# Patient Record
Sex: Female | Born: 1950
Health system: Southern US, Community
[De-identification: ages and names within clinical notes are randomized; demographics above are authoritative.]

## PROBLEM LIST (undated history)

## (undated) DIAGNOSIS — M1990 Unspecified osteoarthritis, unspecified site: Secondary | ICD-10-CM

## (undated) DIAGNOSIS — E7800 Pure hypercholesterolemia, unspecified: Secondary | ICD-10-CM

## (undated) DIAGNOSIS — K589 Irritable bowel syndrome without diarrhea: Secondary | ICD-10-CM

## (undated) DIAGNOSIS — B009 Herpesviral infection, unspecified: Secondary | ICD-10-CM

## (undated) DIAGNOSIS — T7840XA Allergy, unspecified, initial encounter: Secondary | ICD-10-CM

## (undated) DIAGNOSIS — S82899A Other fracture of unspecified lower leg, initial encounter for closed fracture: Secondary | ICD-10-CM

## (undated) DIAGNOSIS — I10 Essential (primary) hypertension: Secondary | ICD-10-CM

## (undated) DIAGNOSIS — G4733 Obstructive sleep apnea (adult) (pediatric): Secondary | ICD-10-CM

## (undated) DIAGNOSIS — H9190 Unspecified hearing loss, unspecified ear: Secondary | ICD-10-CM

## (undated) DIAGNOSIS — E559 Vitamin D deficiency, unspecified: Secondary | ICD-10-CM

## (undated) DIAGNOSIS — K253 Acute gastric ulcer without hemorrhage or perforation: Secondary | ICD-10-CM

## (undated) DIAGNOSIS — M545 Low back pain: Secondary | ICD-10-CM

## (undated) DIAGNOSIS — K648 Other hemorrhoids: Secondary | ICD-10-CM

## (undated) DIAGNOSIS — K219 Gastro-esophageal reflux disease without esophagitis: Secondary | ICD-10-CM

## (undated) DIAGNOSIS — K760 Fatty (change of) liver, not elsewhere classified: Secondary | ICD-10-CM

## (undated) DIAGNOSIS — M2550 Pain in unspecified joint: Secondary | ICD-10-CM

## (undated) DIAGNOSIS — E785 Hyperlipidemia, unspecified: Secondary | ICD-10-CM

## (undated) DIAGNOSIS — R1013 Epigastric pain: Secondary | ICD-10-CM

## (undated) DIAGNOSIS — F329 Major depressive disorder, single episode, unspecified: Secondary | ICD-10-CM

## (undated) DIAGNOSIS — L578 Other skin changes due to chronic exposure to nonionizing radiation: Secondary | ICD-10-CM

## (undated) DIAGNOSIS — E039 Hypothyroidism, unspecified: Secondary | ICD-10-CM

## (undated) DIAGNOSIS — F0390 Unspecified dementia without behavioral disturbance: Secondary | ICD-10-CM

## (undated) DIAGNOSIS — F32A Depression, unspecified: Secondary | ICD-10-CM

## (undated) DIAGNOSIS — K5790 Diverticulosis of intestine, part unspecified, without perforation or abscess without bleeding: Secondary | ICD-10-CM

## (undated) DIAGNOSIS — R87629 Unspecified abnormal cytological findings in specimens from vagina: Secondary | ICD-10-CM

## (undated) DIAGNOSIS — Z972 Presence of dental prosthetic device (complete) (partial): Secondary | ICD-10-CM

## (undated) DIAGNOSIS — M722 Plantar fascial fibromatosis: Secondary | ICD-10-CM

## (undated) DIAGNOSIS — F419 Anxiety disorder, unspecified: Secondary | ICD-10-CM

## (undated) DIAGNOSIS — G4700 Insomnia, unspecified: Secondary | ICD-10-CM

## (undated) DIAGNOSIS — R4189 Other symptoms and signs involving cognitive functions and awareness: Secondary | ICD-10-CM

## (undated) HISTORY — DX: Diverticulosis of intestine, part unspecified, without perforation or abscess without bleeding: K57.90

## (undated) HISTORY — PX: HEMORRHOID BANDING: SHX5850

## (undated) HISTORY — DX: Irritable bowel syndrome, unspecified: K58.9

## (undated) HISTORY — PX: COLONOSCOPY: SHX5424

## (undated) HISTORY — DX: Pain in unspecified joint: M25.50

## (undated) HISTORY — DX: Allergy, unspecified, initial encounter: T78.40XA

## (undated) HISTORY — DX: Plantar fascial fibromatosis: M72.2

## (undated) HISTORY — DX: Low back pain: M54.5

## (undated) HISTORY — DX: Epigastric pain: R10.13

## (undated) HISTORY — DX: Depression, unspecified: F32.A

## (undated) HISTORY — PX: APPENDECTOMY: SHX54

## (undated) HISTORY — PX: NASAL SEPTUM SURGERY: SHX37

## (undated) HISTORY — DX: Anxiety disorder, unspecified: F41.9

## (undated) HISTORY — PX: CATARACT EXTRACTION, BILATERAL: SHX1313

## (undated) HISTORY — DX: Essential (primary) hypertension: I10

## (undated) HISTORY — PX: HAND SURGERY: SHX662

## (undated) HISTORY — DX: Other symptoms and signs involving cognitive functions and awareness: R41.89

## (undated) HISTORY — DX: Acute gastric ulcer without hemorrhage or perforation: K25.3

## (undated) HISTORY — PX: EYE SURGERY: SHX253

## (undated) HISTORY — DX: Other hemorrhoids: K64.8

## (undated) HISTORY — DX: Gastro-esophageal reflux disease without esophagitis: K21.9

## (undated) HISTORY — DX: Herpesviral infection, unspecified: B00.9

## (undated) HISTORY — PX: RHINOPLASTY: SHX2354

## (undated) HISTORY — DX: Obstructive sleep apnea (adult) (pediatric): G47.33

## (undated) HISTORY — DX: Other skin changes due to chronic exposure to nonionizing radiation: L57.8

## (undated) HISTORY — DX: Vitamin D deficiency, unspecified: E55.9

## (undated) HISTORY — DX: Unspecified abnormal cytological findings in specimens from vagina: R87.629

## (undated) HISTORY — PX: GASTRECTOMY: SHX58

## (undated) HISTORY — DX: Major depressive disorder, single episode, unspecified: F32.9

## (undated) HISTORY — DX: Hypothyroidism, unspecified: E03.9

## (undated) HISTORY — DX: Hyperlipidemia, unspecified: E78.5

## (undated) HISTORY — DX: Fatty (change of) liver, not elsewhere classified: K76.0

---

## 1993-07-14 HISTORY — PX: TOE SURGERY: SHX1073

## 1996-07-14 HISTORY — PX: KNEE ARTHROSCOPY: SHX127

## 1999-12-16 ENCOUNTER — Emergency Department (HOSPITAL_COMMUNITY): Admission: EM | Admit: 1999-12-16 | Discharge: 1999-12-16 | Payer: Self-pay | Admitting: Emergency Medicine

## 2007-01-05 ENCOUNTER — Encounter: Payer: Self-pay | Admitting: Adult Health

## 2009-12-12 LAB — HM MAMMOGRAPHY: HM Mammogram: NORMAL

## 2009-12-20 ENCOUNTER — Encounter: Payer: Self-pay | Admitting: Family

## 2009-12-26 ENCOUNTER — Encounter: Payer: Self-pay | Admitting: Family

## 2010-02-07 ENCOUNTER — Encounter: Payer: Self-pay | Admitting: Adult Health

## 2010-02-08 ENCOUNTER — Encounter: Payer: Self-pay | Admitting: Adult Health

## 2010-02-21 ENCOUNTER — Encounter: Payer: Self-pay | Admitting: Pulmonary Disease

## 2010-03-13 ENCOUNTER — Ambulatory Visit: Payer: Self-pay | Admitting: Family

## 2010-03-13 DIAGNOSIS — F341 Dysthymic disorder: Secondary | ICD-10-CM

## 2010-04-12 ENCOUNTER — Ambulatory Visit: Payer: Self-pay | Admitting: Internal Medicine

## 2010-04-12 ENCOUNTER — Encounter: Payer: Self-pay | Admitting: Internal Medicine

## 2010-04-12 DIAGNOSIS — I10 Essential (primary) hypertension: Secondary | ICD-10-CM | POA: Insufficient documentation

## 2010-04-12 DIAGNOSIS — E1149 Type 2 diabetes mellitus with other diabetic neurological complication: Secondary | ICD-10-CM | POA: Insufficient documentation

## 2010-04-12 DIAGNOSIS — J45909 Unspecified asthma, uncomplicated: Secondary | ICD-10-CM | POA: Insufficient documentation

## 2010-04-12 DIAGNOSIS — J4520 Mild intermittent asthma, uncomplicated: Secondary | ICD-10-CM | POA: Insufficient documentation

## 2010-04-12 DIAGNOSIS — E785 Hyperlipidemia, unspecified: Secondary | ICD-10-CM | POA: Insufficient documentation

## 2010-04-12 LAB — CONVERTED CEMR LAB
ALT: 24 units/L (ref 0–35)
AST: 18 units/L (ref 0–37)
Albumin: 4.3 g/dL (ref 3.5–5.2)
Alkaline Phosphatase: 55 units/L (ref 39–117)
Chloride: 108 meq/L (ref 96–112)
Creatinine, Ser: 0.76 mg/dL (ref 0.40–1.20)
Creatinine, Urine: 192.9 mg/dL
Glucose, Bld: 102 mg/dL — ABNORMAL HIGH (ref 70–99)
Indirect Bilirubin: 0.4 mg/dL (ref 0.0–0.9)
MCV: 92.8 fL (ref 78.0–100.0)
RBC: 4.43 M/uL (ref 3.87–5.11)
Sodium: 141 meq/L (ref 135–145)
TSH: 3.137 microintl units/mL (ref 0.350–4.500)
Total Bilirubin: 0.5 mg/dL (ref 0.3–1.2)
Total CHOL/HDL Ratio: 3.7
Total Protein: 6.9 g/dL (ref 6.0–8.3)
VLDL: 26 mg/dL (ref 0–40)
WBC: 6 10*3/uL (ref 4.0–10.5)

## 2010-04-15 ENCOUNTER — Encounter: Payer: Self-pay | Admitting: Internal Medicine

## 2010-04-16 ENCOUNTER — Encounter: Payer: Self-pay | Admitting: Internal Medicine

## 2010-04-18 ENCOUNTER — Encounter: Payer: Self-pay | Admitting: Pulmonary Disease

## 2010-04-25 ENCOUNTER — Encounter: Payer: Self-pay | Admitting: Pulmonary Disease

## 2010-05-14 ENCOUNTER — Ambulatory Visit: Payer: Self-pay | Admitting: Internal Medicine

## 2010-05-14 DIAGNOSIS — M722 Plantar fascial fibromatosis: Secondary | ICD-10-CM

## 2010-05-14 LAB — HM DIABETES EYE EXAM

## 2010-05-16 ENCOUNTER — Encounter (INDEPENDENT_AMBULATORY_CARE_PROVIDER_SITE_OTHER): Payer: Self-pay | Admitting: *Deleted

## 2010-05-20 ENCOUNTER — Ambulatory Visit: Payer: Self-pay | Admitting: Internal Medicine

## 2010-05-27 ENCOUNTER — Ambulatory Visit: Payer: Self-pay | Admitting: Family

## 2010-06-03 ENCOUNTER — Ambulatory Visit: Payer: Self-pay | Admitting: Internal Medicine

## 2010-06-03 LAB — HM COLONOSCOPY

## 2010-06-13 ENCOUNTER — Telehealth: Payer: Self-pay | Admitting: Internal Medicine

## 2010-06-13 ENCOUNTER — Emergency Department (HOSPITAL_BASED_OUTPATIENT_CLINIC_OR_DEPARTMENT_OTHER): Admission: EM | Admit: 2010-06-13 | Discharge: 2010-06-13 | Payer: Self-pay | Admitting: Emergency Medicine

## 2010-06-14 ENCOUNTER — Ambulatory Visit: Payer: Self-pay | Admitting: Family

## 2010-06-14 ENCOUNTER — Encounter: Payer: Self-pay | Admitting: Family

## 2010-06-14 DIAGNOSIS — L02619 Cutaneous abscess of unspecified foot: Secondary | ICD-10-CM | POA: Insufficient documentation

## 2010-06-14 DIAGNOSIS — L03039 Cellulitis of unspecified toe: Secondary | ICD-10-CM

## 2010-06-18 ENCOUNTER — Ambulatory Visit (HOSPITAL_BASED_OUTPATIENT_CLINIC_OR_DEPARTMENT_OTHER)
Admission: RE | Admit: 2010-06-18 | Discharge: 2010-06-18 | Payer: Self-pay | Source: Home / Self Care | Admitting: Pulmonary Disease

## 2010-06-18 ENCOUNTER — Encounter: Payer: Self-pay | Admitting: Family

## 2010-06-18 ENCOUNTER — Ambulatory Visit: Payer: Self-pay

## 2010-06-18 DIAGNOSIS — G4733 Obstructive sleep apnea (adult) (pediatric): Secondary | ICD-10-CM

## 2010-06-20 LAB — CONVERTED CEMR LAB
BUN: 13 mg/dL (ref 6–23)
CO2: 26 meq/L (ref 19–32)
Chloride: 106 meq/L (ref 96–112)
Creatinine, Ser: 0.73 mg/dL (ref 0.40–1.20)
Eosinophils Relative: 2 % (ref 0–5)
Glucose, Bld: 84 mg/dL (ref 70–99)
Lymphocytes Relative: 37 % (ref 12–46)
Lymphs Abs: 2.6 10*3/uL (ref 0.7–4.0)
MCHC: 33 g/dL (ref 30.0–36.0)
Neutro Abs: 3.9 10*3/uL (ref 1.7–7.7)
Neutrophils Relative %: 55 % (ref 43–77)
Platelets: 249 10*3/uL (ref 150–400)
Pro B Natriuretic peptide (BNP): 7.7 pg/mL (ref 0.0–100.0)
RBC: 4.35 M/uL (ref 3.87–5.11)
RDW: 12.8 % (ref 11.5–15.5)
Sodium: 142 meq/L (ref 135–145)

## 2010-06-21 ENCOUNTER — Ambulatory Visit: Payer: Self-pay | Admitting: Family

## 2010-06-25 ENCOUNTER — Telehealth (INDEPENDENT_AMBULATORY_CARE_PROVIDER_SITE_OTHER): Payer: Self-pay | Admitting: *Deleted

## 2010-06-28 ENCOUNTER — Encounter: Payer: Self-pay | Admitting: Family

## 2010-07-01 ENCOUNTER — Telehealth (INDEPENDENT_AMBULATORY_CARE_PROVIDER_SITE_OTHER): Payer: Self-pay | Admitting: *Deleted

## 2010-07-02 ENCOUNTER — Ambulatory Visit: Payer: Self-pay

## 2010-07-02 ENCOUNTER — Encounter: Payer: Self-pay | Admitting: *Deleted

## 2010-07-02 ENCOUNTER — Encounter: Payer: Self-pay | Admitting: Cardiology

## 2010-07-02 ENCOUNTER — Encounter (HOSPITAL_COMMUNITY)
Admission: RE | Admit: 2010-07-02 | Discharge: 2010-08-13 | Payer: Self-pay | Source: Home / Self Care | Attending: Internal Medicine | Admitting: Internal Medicine

## 2010-07-02 ENCOUNTER — Ambulatory Visit: Payer: Self-pay | Admitting: Pulmonary Disease

## 2010-07-05 ENCOUNTER — Telehealth: Payer: Self-pay | Admitting: Family

## 2010-07-05 ENCOUNTER — Ambulatory Visit (HOSPITAL_BASED_OUTPATIENT_CLINIC_OR_DEPARTMENT_OTHER)
Admission: RE | Admit: 2010-07-05 | Discharge: 2010-07-05 | Payer: Self-pay | Source: Home / Self Care | Attending: Internal Medicine | Admitting: Internal Medicine

## 2010-07-05 ENCOUNTER — Ambulatory Visit: Payer: Self-pay | Admitting: Family

## 2010-07-10 ENCOUNTER — Telehealth: Payer: Self-pay | Admitting: Family

## 2010-07-12 ENCOUNTER — Telehealth: Payer: Self-pay | Admitting: Family

## 2010-07-12 ENCOUNTER — Ambulatory Visit (HOSPITAL_BASED_OUTPATIENT_CLINIC_OR_DEPARTMENT_OTHER)
Admission: RE | Admit: 2010-07-12 | Discharge: 2010-07-12 | Payer: Self-pay | Source: Home / Self Care | Attending: Internal Medicine | Admitting: Internal Medicine

## 2010-07-12 ENCOUNTER — Ambulatory Visit: Payer: Self-pay | Admitting: Family

## 2010-07-19 ENCOUNTER — Encounter: Payer: Self-pay | Admitting: Family

## 2010-07-19 ENCOUNTER — Ambulatory Visit
Admission: RE | Admit: 2010-07-19 | Discharge: 2010-07-19 | Payer: Self-pay | Source: Home / Self Care | Attending: Family | Admitting: Family

## 2010-07-19 DIAGNOSIS — R9431 Abnormal electrocardiogram [ECG] [EKG]: Secondary | ICD-10-CM | POA: Insufficient documentation

## 2010-07-19 DIAGNOSIS — K3189 Other diseases of stomach and duodenum: Secondary | ICD-10-CM | POA: Insufficient documentation

## 2010-07-19 DIAGNOSIS — R1013 Epigastric pain: Secondary | ICD-10-CM

## 2010-07-19 LAB — CONVERTED CEMR LAB
ALT: 36 units/L — ABNORMAL HIGH (ref 0–35)
AST: 21 units/L (ref 0–37)
Alkaline Phosphatase: 66 units/L (ref 39–117)
Amylase: 17 units/L (ref 0–105)
Basophils Absolute: 0 10*3/uL (ref 0.0–0.1)
Bilirubin, Direct: 0.1 mg/dL (ref 0.0–0.3)
Hemoglobin: 13.6 g/dL (ref 12.0–15.0)
Lipase: 56 units/L (ref 0–75)
Lymphocytes Relative: 33 % (ref 12–46)
MCHC: 31.8 g/dL (ref 30.0–36.0)
Monocytes Absolute: 0.7 10*3/uL (ref 0.1–1.0)
Neutro Abs: 4.2 10*3/uL (ref 1.7–7.7)
Platelets: 298 10*3/uL (ref 150–400)
RBC: 4.44 M/uL (ref 3.87–5.11)
RDW: 13.1 % (ref 11.5–15.5)
Total Bilirubin: 0.3 mg/dL (ref 0.3–1.2)

## 2010-07-22 ENCOUNTER — Telehealth: Payer: Self-pay | Admitting: Family

## 2010-07-24 ENCOUNTER — Ambulatory Visit
Admission: RE | Admit: 2010-07-24 | Discharge: 2010-07-24 | Payer: Self-pay | Source: Home / Self Care | Attending: Cardiology | Admitting: Cardiology

## 2010-07-24 ENCOUNTER — Encounter: Payer: Self-pay | Admitting: Cardiology

## 2010-07-24 DIAGNOSIS — R002 Palpitations: Secondary | ICD-10-CM | POA: Insufficient documentation

## 2010-07-31 ENCOUNTER — Ambulatory Visit: Admit: 2010-07-31 | Payer: Self-pay | Admitting: Family

## 2010-08-01 ENCOUNTER — Telehealth (INDEPENDENT_AMBULATORY_CARE_PROVIDER_SITE_OTHER): Payer: Self-pay | Admitting: *Deleted

## 2010-08-02 ENCOUNTER — Other Ambulatory Visit
Admission: RE | Admit: 2010-08-02 | Discharge: 2010-08-02 | Payer: Self-pay | Source: Home / Self Care | Admitting: Internal Medicine

## 2010-08-02 ENCOUNTER — Other Ambulatory Visit: Payer: Self-pay | Admitting: Family

## 2010-08-02 ENCOUNTER — Ambulatory Visit
Admission: RE | Admit: 2010-08-02 | Discharge: 2010-08-02 | Payer: Self-pay | Source: Home / Self Care | Attending: Family | Admitting: Family

## 2010-08-02 DIAGNOSIS — R928 Other abnormal and inconclusive findings on diagnostic imaging of breast: Secondary | ICD-10-CM | POA: Insufficient documentation

## 2010-08-02 DIAGNOSIS — N949 Unspecified condition associated with female genital organs and menstrual cycle: Secondary | ICD-10-CM | POA: Insufficient documentation

## 2010-08-02 LAB — CONVERTED CEMR LAB: Hgb A1c MFr Bld: 6.7 % — ABNORMAL HIGH (ref <5.7)

## 2010-08-05 ENCOUNTER — Encounter: Payer: Self-pay | Admitting: Family

## 2010-08-05 ENCOUNTER — Telehealth: Payer: Self-pay | Admitting: Family

## 2010-08-05 ENCOUNTER — Encounter (INDEPENDENT_AMBULATORY_CARE_PROVIDER_SITE_OTHER): Payer: Self-pay | Admitting: *Deleted

## 2010-08-06 ENCOUNTER — Encounter: Payer: Self-pay | Admitting: Family

## 2010-08-06 ENCOUNTER — Telehealth: Payer: Self-pay | Admitting: Family

## 2010-08-06 DIAGNOSIS — A6000 Herpesviral infection of urogenital system, unspecified: Secondary | ICD-10-CM | POA: Insufficient documentation

## 2010-08-09 ENCOUNTER — Encounter: Payer: Self-pay | Admitting: Family

## 2010-08-09 LAB — HM MAMMOGRAPHY

## 2010-08-13 NOTE — Letter (Signed)
Summary: [facility]   Imported By: Lanelle Bal 05/25/2010 09:48:41  _____________________________________________________________________  External Attachment:    Type:   Image     Comment:   External Document

## 2010-08-13 NOTE — Progress Notes (Signed)
Summary: Patient to ED  Phone Note Call from Patient   Caller: Patient Details for Reason: Facial numbness Summary of Call:  Pt called having facial numbness  fingers numb  " just feel out of whack"   .  instructed by Dr Artist Pais to go to ED Initial call taken by: Darral Dash,  June 13, 2010 12:29 PM

## 2010-08-13 NOTE — Letter (Signed)
   [place] [facility] [address] [place], [place]  [address]  [place] Phone: [phone]      April 16, 2010   [facility] [address] [place], [place] [address]  RE:  LAB RESULTS  Dear  Ms. Schlechter,  The following is an interpretation of your most recent lab tests.  Please take note of any instructions provided or changes to medications that have resulted from your lab work.  ELECTROLYTES:  Good - no changes needed  KIDNEY FUNCTION TESTS:  Good - no changes needed  LIVER FUNCTION TESTS:  Good - no changes needed  LIPID PANEL:  Good - no changes needed Triglyceride: 131   Cholesterol: 169   LDL: 97   HDL: 46   Chol/HDL%:  3.7 Ratio  THYROID STUDIES:  Thyroid studies normal TSH: 3.137     DIABETIC STUDIES:  Good - no changes needed Blood Glucose: 102   HgbA1C: 6.3   Microalbumin/Creatinine Ratio: 2.6     CBC:  Good - no changes needed    Please call our office if you continue to have low blood sugar readings.     Sincerely Yours,    Dr. Thomos Lemons  Appended Document:  mailed

## 2010-08-13 NOTE — Letter (Signed)
Summary: Diabetic Instructions  [facility]  [address]   [place], [place] [address]   Phone: [phone]  Fax: [phone]    Crystal [name] 12-12-1950 MRN: [id number]   (Metformin and Glimepiride)    ORAL DIABETIC MEDICATION INSTRUCTIONS  The day before your procedure:   Take your diabetic pill as you do normally  The day of your procedure:   Do not take your diabetic pill    We will check your blood sugar levels during the admission process and again in Recovery before discharging you home  ________________________________________________________________________

## 2010-08-13 NOTE — Procedures (Signed)
Summary: Colonoscopy  Patient: [name] Note: All result statuses are Final unless otherwise noted.  Tests: (1) Colonoscopy (COL)   COL Colonoscopy           DONE     [facility]     [address]     [place], [place]  [address]           COLONOSCOPY PROCEDURE REPORT           PATIENT:  Crystal [name], Crystal [name]  MR#:  [id number]     BIRTHDATE:  1950/12/27, 59 yrs. old  GENDER:  female     ENDOSCOPIST:  Iva Boop, MD, [facility]     REF. BY:  Thomos Lemons, DO     PROCEDURE DATE:  06/03/2010     PROCEDURE:  Colonoscopy [id number]     ASA CLASS:  Class III     INDICATIONS:  Elevated Risk Screening, family history of colon     cancer sister diagnosed in her 55's     MEDICATIONS:   Fentanyl 75 mcg IV, Versed 8 mg IV           DESCRIPTION OF PROCEDURE:   After the risks benefits and     alternatives of the procedure were thoroughly explained, informed     consent was obtained.  Digital rectal exam was performed and     revealed no abnormalities.   The LB 180AL [id number] endoscope was     introduced through the anus and advanced to the cecum, which was     identified by both the appendix and ileocecal valve, without     limitations.  The quality of the prep was excellent, using     MoviPrep.  The instrument was then slowly withdrawn as the colon     was fully examined. Insertion: 2:45 minutes Withdrawal: 9:49     minutes     <<PROCEDUREIMAGES>>           FINDINGS:  Moderate diverticulosis was found in the sigmoid colon.     This was otherwise a normal examination of the colon including     right colon retroflexion.   Retroflexed views in the rectum     revealed internal and external hemorrhoids.    The scope was then     withdrawn from the patient and the procedure completed.           COMPLICATIONS:  None     ENDOSCOPIC IMPRESSION:     1) Moderate diverticulosis in the sigmoid colon     2) Internal and external hemorrhoids     3) Otherwise normal examination with excellent prep     4) Family history of colon cancer (sister in her 28's)           REPEAT EXAM:  In 5 year(s) for routine screening colonoscopy.           Iva Boop, MD, [facility]           CC:  Thomos Lemons, DO and The Patient           n.     eSIGNED:   Iva Boop at 06/03/2010 09:04 AM           Jeronimo Norma, [id number]  Note: An exclamation mark (!) indicates a result that was not dispersed into the flowsheet. Document Creation Date: 06/03/2010 9:05 AM _______________________________________________________________________  (1) Order result status: Final Collection or observation date-time: 06/03/2010 08:55 Requested date-time:  Receipt date-time:  Reported  date-time:  Referring Physician:   Ordering Physician: Stan Head [phone]) Specimen Source:  Source: Launa Grill Order Number: [id number] Lab site:   Appended Document: Colonoscopy    Clinical Lists Changes  Observations: Added new observation of COLONNXTDUE: 05/2015 (06/03/2010 11:50)

## 2010-08-13 NOTE — Letter (Signed)
Summary: Pre Visit Letter Revised  [facility]  [address] [place], [place] [address]   Phone: [phone]  Fax: [phone]        04/12/2010 MRN: [id number] Crystal [name] [address] [place], [place]  [address]             Procedure Date:  11-21 at 8:30am  Welcome to the Gastroenterology Division at [facility].    You are scheduled to see a nurse for your pre-procedure visit on 05-20-10 at 10am  on the 3rd floor at [facility], [address]  We ask that you try to arrive at our office 15 minutes prior to your appointment time to allow for check-in.  Please take a minute to review the attached form.  If you answer "Yes" to one or more of the questions on the first page, we ask that you call the person listed at your earliest opportunity.  If you answer "No" to all of the questions, please complete the rest of the form and bring it to your appointment.    Your nurse visit will consist of discussing your medical and surgical history, your immediate family medical history, and your medications.   If you are unable to list all of your medications on the form, please bring the medication bottles to your appointment and we will list them.  We will need to be aware of both prescribed and over the counter drugs.  We will need to know exact dosage information as well.    Please be prepared to read and sign documents such as consent forms, a financial agreement, and acknowledgement forms.  If necessary, and with your consent, a friend or relative is welcome to sit-in on the nurse visit with you.  Please bring your insurance card so that we may make a copy of it.  If your insurance requires a referral to see a specialist, please bring your referral form from your primary care physician.  No co-pay is required for this nurse visit.     If you cannot keep your appointment, please call [phone] to cancel or reschedule prior to your appointment date.   This allows [place] the opportunity to schedule an appointment for another patient in need of care.    Thank you for choosing [facility] for your medical needs.  We appreciate the opportunity to care for you.  Please visit [place] at our website  to learn more about our practice.  Sincerely, The Gastroenterology Division

## 2010-08-13 NOTE — Assessment & Plan Note (Signed)
Summary: NEW TO EST/[employer] lm home machine   Vital Signs:  Patient profile:   60 year old female LMP:     07/15/1995 Height:      63.25 inches Weight:      157.50 pounds BMI:     27.78 Temp:     97.7 degrees F oral Pulse rate:   72 / minute Pulse rhythm:   regular Resp:     12 per minute BP sitting:   126 / 80  (right arm) Cuff size:   regular  Vitals Entered By: Mervin Kung CMA Duncan Dull) (March 13, 2010 10:09 AM) Is Patient Diabetic? Yes Pain Assessment Patient in pain? no      Comments Wants to get eye exam, needs better diabetes control, stressed out. Also has sinus congestion. Nicki Guadalajara Fergerson CMA [facility] Dull)  March 13, 2010 10:12 AM  LMP (date): 07/15/1995     Enter LMP: 07/15/1995   History of Present Illness: Ms Salvi is a 60 year old female who presents today to establish care.   Previously, she has been followed by Dr. Dutch Gray, [facility].  Notes that she was recenlty diagnosed with asthma.  Notes that she is struggling with her husband's recent cancer diagnosis- melanoma of the lungs.   Notes that she often has been irritable, worse since entering menopause.  Occasional tearfulness.  Notes that she does not active suicidal thougths.  Does not sleep well- using Dulera for her asthma.  Using Pro-air about every other day. Not exercising, lost interest in things she likes to do.  Has gained 6 pounds.   Patient was at [facility] due to syncope and was reportedly diagnosed with asthma.    Never been a smoker.  Tells me that she also recently lost her dog who she was very close with and whom she used to walk with every day.   Preventive Screening-Counseling & Management  Alcohol-Tobacco     Alcohol drinks/day: 0     Smoking Status: never  Caffeine-Diet-Exercise     Caffeine use/day: none     Does Patient Exercise: no  Allergies (verified): No Known Drug Allergies  Past History:  Past Medical History: Asthma Diabetes 2- diagnosed in  2003 Fainting spells secondary to asthma Hx of stomach ulcer as a teenager HTN Hypercholesterolemia  Past Surgical History: Left knee laproscopy-- 1998 Right Toe surgery-- 1995 Ulcer sugery at age 62 (?partial gastrectomy) deviated septum, rhinoplasty   Family History: Mother-- Diabetes Type I, heart prob.; deceased in her early 42's Father-- Bleeding ulcer, deceased  late seventies 4 kids-- 3 healthy, 1 died at 6 wks (heart defects, perforated bowel)  Social History: Occupation:  data entry- works at [employer] Married Never Smoked Alcohol use-no Regular exercise-no Smoking Status:  never Caffeine use/day:  none Does Patient Exercise:  no  Physical Exam  General:  Well-developed,well-nourished,in no acute distress; alert,appropriate and cooperative throughout examination Lungs:  Normal respiratory effort, chest expands symmetrically. Lungs are clear to auscultation, no crackles or wheezes. Heart:  Normal rate and regular rhythm. S1 and S2 normal without gallop, murmur, click, rub or other extra sounds. Extremities:  No clubbing, cyanosis, edema, or deformity noted with normal full range of motion of all joints.   Neurologic:  No cranial nerve deficits noted. Station and gait are normal. Plantar reflexes are down-going bilaterally. DTRs are symmetrical throughout. Sensory, motor and coordinative functions appear intact. Psych:  pleasant female, Oriented X3.  Became tearful at times during interview.   Impression &  Recommendations:  Problem # 1:  DEPRESSION/ANXIETY (ICD-300.4) Assessment Deteriorated  60 yr old patient with history of depression, no worsened by recent death of her dog and husband's recent diagnosis of metatstatic melanoma with mets to his lungs.  Will plan trial of Sertraline with follow up in 1 month.  Patient and husband were educated on side effects and red flags that should prompt return as noted in pt instructions.  30 minutes were spent with the patient.   >50% of this time was spent counseling the patient on her depression and anxiety.    Complete Medication List: 1)  Amaryl 2 Mg Tabs (Glimepiride) .... Take 1 tablet by mouth once a day 2)  Lipitor 10 Mg Tabs (Atorvastatin calcium) .... Take 1 tablet by mouth once a day 3)  Dulera 200-5 Mcg/act Aero (Mometasone furo-formoterol fum) .... Take 2 puffs every 12 hours 4)  Proair Hfa 108 (90 Base) Mcg/act Aers (Albuterol sulfate) .... Take 2 puffs every 4 hours as needed. 5)  Lisinopril-hydrochlorothiazide 10-12.5 Mg Tabs (Lisinopril-hydrochlorothiazide) .... Take 1 tablet by mouth once a day 6)  Hydrochlorothiazide 25 Mg Tabs (Hydrochlorothiazide) .... Take 1 tablet by mouth once a day 7)  Sertraline Hcl 50 Mg Tabs (Sertraline hcl) .... One tablet by mouth daily  Patient Instructions: 1)  Sertraline- Please start 1/2 tablet one a day for one week, then increase to a full tablet. 2)  It will likely take several weeks before you will notice improvement. 3)  Side effects of this medicine may include drowsiness or nausea.  If this becomes an issue for you call for further instructions. 4)  Very rarely people may develop suicidal thoughts when taking these types of medicines- should this happen to you, discontinue medication and go directly to the emergency room. 5)  Please arrange a follow up appointment in 1 month  Prescriptions: SERTRALINE HCL 50 MG TABS (SERTRALINE HCL) one tablet by mouth daily  #30 x 1   Entered and Authorized by:   Lemont Fillers FNP   Signed by:   Lemont Fillers FNP on 03/13/2010   Method used:   Electronically to        [facility]* (retail)       [address] [place], [place]  [address]       Ph: [phone]       Fax: [phone]   RxID:   [id number]    Preventive Care Screening  Mammogram:    Date:  12/12/2009    Results:  normal      Last pap smear was years go.  Don't know last tetanus. Nicki Guadalajara Fergerson CMA  [facility] Dull)  March 13, 2010 10:22 AM     Immunization History:  Pneumovax Immunization History:    Pneumovax:  historical (07/14/2004)     Vital Signs:  Patient Profile:   60 year old female LMP:     07/15/1995 Height:     63.25 inches Weight:      157.50 pounds BMI:     27.78 Temp:     97.7 degrees F oral Pulse rate:   72 / minute Pulse rhythm:   regular Resp:     12 per minute BP sitting:   126 / 80 Cuff size:   regular                 Current Allergies (reviewed today): No known allergies

## 2010-08-13 NOTE — Miscellaneous (Signed)
Summary: [facility]  Clinical Lists Changes  Medications: Added new medication of MOVIPREP 100 GM  SOLR (PEG-KCL-NACL-NASULF-NA ASC-C) As per prep instructions. - Signed Rx of MOVIPREP 100 GM  SOLR (PEG-KCL-NACL-NASULF-NA ASC-C) As per prep instructions.;  #1 x 0;  Signed;  Entered by: Ezra Sites RN;  Authorized by: Iva Boop MD, [facility];  Method used: Electronically to [facility] [address] [place], [place], [place]  [address], Ph: [phone], Fax: [phone] Observations: Added new observation of ALLERGY REV: Done (05/20/2010 9:30)    Prescriptions: MOVIPREP 100 GM  SOLR (PEG-KCL-NACL-NASULF-NA ASC-C) As per prep instructions.  #1 x 0   Entered by:   Ezra Sites RN   Authorized by:   Iva Boop MD, [facility]   Signed by:   Ezra Sites RN on 05/20/2010   Method used:   Electronically to        [facility]* (retail)       [address] [place], [place]  [address]       Ph: [phone]       Fax: [phone]   RxID:   [id number]

## 2010-08-13 NOTE — Letter (Signed)
Summary: Moviprep Instructions  [facility]  [address]   [place], [place] [address]   Phone: [phone]  Fax: [phone]       Adreena Mcleish    1950-10-14    MRN: [id number]        Procedure Day Dorna Bloom: Monday, 06-03-10     Arrival Time: 7:30 a.m.     Procedure Time: 8:30 a.m.     Location of Procedure:                     x  [facility] (4th Floor)   PREPARATION FOR COLONOSCOPY WITH MOVIPREP   Starting 5 days prior to your procedure 05-29-10 do not eat nuts, seeds, popcorn, corn, beans, peas,  salads, or any raw vegetables.  Do not take any fiber supplements (e.g. Metamucil, Citrucel, and Benefiber).  THE DAY BEFORE YOUR PROCEDURE         DATE: 06-02-10   DAY: Sunday  1.  Drink clear liquids the entire day-NO SOLID FOOD  2.  Do not drink anything colored red or purple.  Avoid juices with pulp.  No orange juice.  3.  Drink at least 64 oz. (8 glasses) of fluid/clear liquids during the day to prevent dehydration and help the prep work efficiently.  CLEAR LIQUIDS INCLUDE: Water Jello Ice Popsicles Tea (sugar ok, no milk/cream) Powdered fruit flavored drinks Coffee (sugar ok, no milk/cream) Gatorade Juice: apple, white grape, white cranberry  Lemonade Clear bullion, consomm, broth Carbonated beverages (any kind) Strained chicken noodle soup Hard Candy                             4.  In the morning, mix first dose of MoviPrep solution:    Empty 1 Pouch A and 1 Pouch B into the disposable container    Add lukewarm drinking water to the top line of the container. Mix to dissolve    Refrigerate (mixed solution should be used within 24 hrs)  5.  Begin drinking the prep at 5:00 p.m. The MoviPrep container is divided by 4 marks.   Every 15 minutes drink the solution down to the next mark (approximately 8 oz) until the full liter is complete.   6.  Follow completed prep with 16 oz of clear liquid of your choice (Nothing red or  purple).  Continue to drink clear liquids until bedtime.  7.  Before going to bed, mix second dose of MoviPrep solution:    Empty 1 Pouch A and 1 Pouch B into the disposable container    Add lukewarm drinking water to the top line of the container. Mix to dissolve    Refrigerate  THE DAY OF YOUR PROCEDURE      DATE: 06-03-10   DAY: Monday  Beginning at 3:30 a.m. (5 hours before procedure):         1. Every 15 minutes, drink the solution down to the next mark (approx 8 oz) until the full liter is complete.  2. Follow completed prep with 16 oz. of clear liquid of your choice.    3. You may drink clear liquids until 6:30 a.m. (2 HOURS BEFORE PROCEDURE).   MEDICATION INSTRUCTIONS  Unless otherwise instructed, you should take regular prescription medications with a small sip of water   as early as possible the morning of your procedure.  Diabetic patients - see separate instructions.   Additional medication instructions: Do not take Losartan/HCTZ  day of procedure.         OTHER INSTRUCTIONS  You will need a responsible adult at least 62 years of age to accompany you and drive you home.   This person must remain in the waiting room during your procedure.  Wear loose fitting clothing that is easily removed.  Leave jewelry and other valuables at home.  However, you may wish to bring a book to read or  an iPod/MP3 player to listen to music as you wait for your procedure to start.  Remove all body piercing jewelry and leave at home.  Total time from sign-in until discharge is approximately 2-3 hours.  You should go home directly after your procedure and rest.  You can resume normal activities the  day after your procedure.  The day of your procedure you should not:   Drive   Make legal decisions   Operate machinery   Drink alcohol   Return to work  You will receive specific instructions about eating, activities and medications before you leave.    The above  instructions have been reviewed and explained to me by   Ezra Sites RN  May 20, 2010 10:00 AM    I fully understand and can verbalize these instructions _____________________________ Date _________

## 2010-08-13 NOTE — Assessment & Plan Note (Signed)
Summary: cpx/mhf rsch per pt/dt   Vital Signs:  Patient profile:   60 year old female Weight:      159.25 pounds BMI:     28.09 O2 Sat:      100 % on Room air Temp:     97.6 degrees F oral Pulse rate:   69 / minute Pulse rhythm:   regular Resp:     18 per minute BP sitting:   100 / 70  (right arm)  Vitals Entered By: Glendell Docker CMA (April 12, 2010 8:30 AM)  O2 Flow:  Room air CC: CPX Is Patient Diabetic? Yes Pain Assessment Patient in pain? no      Comments fasting for blood work, states blood sugar bottoms out at 54 on a daily basis in the morning, needs a note to have a snack at her desk for low blood sugars     Last PAP Result Declined   Primary Care Provider:  Dondra Spry DO  CC:  CPX.  History of Present Illness: 60 y/o white female for cpx  last had diabetes checked 5 yrs ago diagnosed in 2003 - pt having freq hypoglycemic episodes pt checking blood sugar regularly  pt takes glimepiride in AM - 6:15 AM  adult health maintenance protocal reviewed     Contraindications/Deferment of Procedures/Staging:    Test/Procedure: TD vaccine    Reason for deferment: declined   Preventive Screening-Counseling & Management  Alcohol-Tobacco     Alcohol drinks/day: 0     Smoking Status: never  Caffeine-Diet-Exercise     Caffeine use/day: None     Does Patient Exercise: no  Allergies (verified): 1)  ! Compazine 2)  ! * Tetanus  Past History:  Past Medical History: Asthma Diabetes 2- diagnosed in 2003 Fainting spells secondary to asthma Hx of stomach ulcer as a teenager HTN  Hypercholesterolemia Hyperlipidemia Hypertension  Past Surgical History: Left knee laproscopy-- 1998 Right Toe surgery-- 1995 Ulcer sugery at age 52 (?partial gastrectomy) deviated septum, rhinoplasty     Family History: Mother-- Diabetes Type II, heart prob.; deceased in her early 41's Father-- Bleeding ulcer, deceased  late seventies 4 kids-- 3 healthy, 1  died at 6 wks (heart defects, perforated bowel)  Social History: Reviewed history from 03/13/2010 and no changes required. 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   Eyes:  pupils equal, pupils round, and pupils reactive to light.   Ears:  R ear normal and L ear normal.   Mouth:  upper dental plate Neck:  No deformities, masses, or tenderness noted.no carotid bruits.   Lungs:  normal respiratory effort, normal breath sounds, no crackles, and no wheezes.   Heart:  normal rate, regular rhythm, no murmur, and no gallop.   Abdomen:  soft, non-tender, and normal bowel sounds.  left lower pelvic fullness Extremities:  trace left pedal edema and trace right pedal edema.   Neurologic:  cranial nerves II-XII intact and gait normal.     Impression & Recommendations:  Problem # 1:  PREVENTIVE HEALTH CARE (ICD-V70.0)  pt defers PAP and Pelvic until November Orders: Gastroenterology Referral (GI)  Mammogram: normal (12/12/2009) Pap smear: Declined (04/12/2010) Td Booster: Allergic (04/12/2010)   Flu Vax: Historical (03/27/2010)   Pneumovax: Historical (07/14/2004)  Problem # 2:  DIABETES MELLITUS, TYPE II (ICD-250.00) pt having hypoglycemia.  reduce glimepiride dose  Her updated medication list for this problem includes:    Glimepiride 1 Mg Tabs (Glimepiride) .Marland Kitchen... 1/2 tab by mouth q am    Losartan Potassium-hctz 50-12.5 Mg Tabs (Losartan potassium-hctz) ..... One by mouth once daily  Orders: T-Basic Metabolic Panel [id number]) T-Hepatic Function [id number]) T-Lipid Profile [id number]) T-CBC No Diff ([id number]) T-TSH ([id number]) T-Urine Microalbumin w/creat. ratio [phone]) Ophthalmology Referral  (Ophthalmology)  Complete Medication List: 1)  Glimepiride 1 Mg Tabs (Glimepiride) .... 1/2 tab by mouth q am 2)  Lipitor 10 Mg Tabs (Atorvastatin calcium) .... Take 1 tablet by mouth once a day 3)  Dulera 200-5 Mcg/act Aero (Mometasone furo-formoterol fum) .... Take 2 puffs every 12 hours 4)  Proair Hfa 108 (90 Base) Mcg/act Aers (Albuterol sulfate) .... Take 2 puffs every 4 hours as needed. 5)  Losartan Potassium-hctz 50-12.5 Mg Tabs (Losartan potassium-hctz) .... One by mouth once daily 6)  Sertraline Hcl 50 Mg Tabs (Sertraline hcl) .... One tablet by mouth daily  Patient Instructions: 1)  Stop lisinopril / hctz 2)  Take lower dose of glimepiride as directed 3)  Please schedule a follow-up appointment in 1 month. Prescriptions: SERTRALINE HCL 50 MG TABS (SERTRALINE HCL) one tablet by mouth daily  #30 x 1   Entered and Authorized by:   D. Thomos Lemons DO   Signed by:   D. Thomos Lemons DO on 04/12/2010   Method used:   Electronically to        [facility]* (retail)       [address] [place], [place]  [address]       Ph: [phone]       Fax: [phone]   RxID:   [id number] LOSARTAN POTASSIUM-HCTZ 50-12.5 MG TABS (LOSARTAN POTASSIUM-HCTZ) one by mouth once daily  #30 x 2   Entered and Authorized by:   D. Thomos Lemons DO   Signed by:   D. Thomos Lemons DO on 04/12/2010   Method used:   Electronically to        [facility]* (retail)       [address] [place], [place]  [address]       Ph: [phone]       Fax: [phone]   RxID:   [id number] LIPITOR 10 MG TABS (ATORVASTATIN CALCIUM) Take 1 tablet by mouth once a day  #90 x 0   Entered and Authorized by:   D. Thomos Lemons DO   Signed by:   D. Thomos Lemons DO on 04/12/2010   Method used:   Electronically to        [facility]* (retail)       [address] [place], [place]  [address]       Ph: [phone]       Fax:  [phone]   RxID:   [id number] LISINOPRIL-HYDROCHLOROTHIAZIDE 10-12.5 MG TABS (LISINOPRIL-HYDROCHLOROTHIAZIDE) Take 1 tablet by mouth once a day  #90 x 0   Entered and Authorized by:   D. Thomos Lemons DO   Signed by:   D. Thomos Lemons DO on 04/12/2010   Method used:   Electronically to        [facility]* (retail)       [address] [place], [place]  [address]       Ph: [phone]       Fax: [phone]   RxID:   [id number] GLIMEPIRIDE 1 MG TABS (GLIMEPIRIDE) 1/2 tab by mouth q am  #30 x 1   Entered and Authorized by:   D. Thomos Lemons DO   Signed by:   D. Thomos Lemons DO on 04/12/2010   Method used:   Electronically to        [facility]* (retail)       [address] [place], [place]  [address]       Ph: [phone]       Fax: [phone]   RxID:   [id number]   Current Allergies (reviewed today): ! COMPAZINE ! * TETANUS    Preventive Care Screening  Last Tetanus Booster:    Date:  04/12/2010    Results:  Allergic  Pap Smear:    Date:  04/12/2010    Results:  Declined    Immunization History:  Influenza Immunization History:    Influenza:  historical (03/27/2010)     Immunization History:  Influenza Immunization History:    Influenza:  Historical (03/27/2010)

## 2010-08-13 NOTE — Assessment & Plan Note (Signed)
Summary: Initial pulmonary consult    Visit Type:  NP follow up visit for Astma Primary Leelynn Whetsel/Referring Michol Emory:  Dondra Spry DO  CC:  Pt c/o increased SOB all the time x 2 to 3 weeks and Abdominal Pain.  History of Present Illness: 60 yo female with known hx of HTN, DM and Hyperlipidmia presented for initial pulmonary consult on June 18, 2010 for evaluation of uncontrolled asthma.   June 18, 2010--Pt complains over last 4 months she has not been able to breathe. Says it all started over last year w/ DOE, fatigue but 4 months ago became progressively dyspneic at rest  She reports she was hospitalized at [facility] 4 months ago after she was at work, went  walking for exercise came back felt very short of breath, had chest pain and says she passed out x 2, EMS tood to hospital. Says she underwent a  cardiac workup. No records are available at this time. We are faxing signed request to obtain records. Says blood work said no heart attack but did not undergo stress test. Says she had breathing test that showed she had asthma. Was referred to [facility] where she was started on [facility] but has had no improvement and use proair 3 x day. She is unable to do any activity without becoming short of breath. She quit the choir at church b/c she gets winded singing. If she talks for long periods of time she becomes dyspneic. She has established PCP w/ Dr. Crissie Sickles NP and was referred to our office, recent simple spirometry shows nml FEV1. Increased 80% to 102% post SABA.  She works fulltime in [employer], no change in house. has 1 indoor pet no unusual hobbies. No known hx of asthma in past or childhood. No family hx of Asthma. Under alot of stress , husband has stage 4 cancer. She does have sinus congestion, drainage intermittent and does have some intermittent GERD despite PPI daily. Recently started on singulair few days ago. Today in office pt w/ no  desaturations w/ walking. HR maintained <110. She does have OSA and is on CPAP. Says she wears nightly but still feels tired. Denies chest pain, orthopnea, hemoptysis, fever, n/v/d, edema, headache. Traveled last month to [place]. No leg swelling or calf pain.      Medications Prior to Update: 1)  Singulair 10 Mg Tabs (Montelukast Sodium) .... One Tablet By Mouth Once Daily 2)  Dulera 200-5 Mcg/act Aero (Mometasone Furo-Formoterol Fum) .... Take 2 Puffs Every 12 Hours 3)  Glimepiride 1 Mg Tabs (Glimepiride) .... 1/2 Tab By Mouth Q Am 4)  Lipitor 10 Mg Tabs (Atorvastatin Calcium) .... Take 1 Tablet By Mouth Once A Day 5)  Metformin Hcl 500 Mg Tabs (Metformin Hcl) .... 1/2 By Mouth Two Times A Day X 5 Days, Then 1 Tab By Mouth Two Times A Day 6)  Losartan Potassium-Hctz 50-12.5 Mg Tabs (Losartan Potassium-Hctz) .... One Half Tablet By Mouth Once Daily 7)  Moviprep 100 Gm  Solr (Peg-Kcl-Nacl-Nasulf-Na Asc-C) .... As Per Prep Instructions. 8)  Amoxicillin 500 Mg Caps (Amoxicillin) .... 2 Caps By Mouth Three Times A Day For 10 Days 9)  Sertraline Hcl 50 Mg Tabs (Sertraline Hcl) .... One Tablet By Mouth Once Daily 10)  Omeprazole 20 Mg Cpdr (Omeprazole) .... Take 1 Capsule By Mouth Two Times A Day. 11)  Loratadine 10 Mg Tabs (Loratadine) .... Take 2 Tablets By Mouth Once A Day. 12)  Aspir-Low 81 Mg  Tbec (Aspirin) .... Take 1 Tablet By Mouth Once A Day. 13)  Centrum Silver  Tabs (Multiple Vitamins-Minerals) .... One Tab By Mouth Once Daily 14)  Proair Hfa 108 (90 Base) Mcg/act Aers (Albuterol Sulfate) .... Take 2 Puffs Every 4 Hours As Needed. 15)  Keflex 500 Mg Caps (Cephalexin) .... 2 Caps By Mouth Twice Daily For 10 Days (For Your Toe)  Current Medications (verified): 1)  Glimepiride 1 Mg Tabs (Glimepiride) .... 1/2 Tab By Mouth Q Am 2)  Lipitor 10 Mg Tabs (Atorvastatin Calcium) .... Take 1 Tablet By Mouth Once A Day 3)  Dulera 200-5 Mcg/act Aero (Mometasone Furo-Formoterol Fum) .... Take 2  Puffs Every 12 Hours 4)  Proair Hfa 108 (90 Base) Mcg/act Aers (Albuterol Sulfate) .... Take 2 Puffs Every 4 Hours As Needed. 5)  Losartan Potassium-Hctz 50-12.5 Mg Tabs (Losartan Potassium-Hctz) .... One Half Tablet By Mouth Once Daily 6)  Sertraline Hcl 50 Mg Tabs (Sertraline Hcl) .... One Tablet By Mouth Once Daily 7)  Metformin Hcl 500 Mg Tabs (Metformin Hcl) .Marland Kitchen.. 1 Tab By Mouth Two Times A Day 8)  Loratadine 10 Mg Tabs (Loratadine) .... Take 2 Tablets By Mouth Once A Day. 9)  Aspir-Low 81 Mg Tbec (Aspirin) .... Take 1 Tablet By Mouth Once A Day. 10)  Omeprazole 20 Mg Cpdr (Omeprazole) .... Take 1 Capsule By Mouth Two Times A Day. 11)  Centrum Silver  Tabs (Multiple Vitamins-Minerals) .... One Tab By Mouth Once Daily 12)  Singulair 10 Mg Tabs (Montelukast Sodium) .... One Tablet By Mouth Once Daily 13)  Keflex 500 Mg Caps (Cephalexin) .... 2 Caps By Mouth Twice Daily For 10 Days (For Your Toe)  Allergies (verified): 1)  ! Compazine 2)  ! 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[place] Never Smoked Alcohol use-no  Regular exercise-no oldest daughter 55 - donna Daughter is 12 and grandchildren live in [place] son 47 - tim From [place]--moved to [place] 2001 Pets -dog  carpet in home (89yrs old) no unusal hobbies   Risk Factors: Smoking Status: never (05/14/2010)  Past Medical History: Asthma Diabetes 2- diagnosed in 2003 ?syncope - 02/2010 Hx of stomach ulcer as a teenager HTN  Hypercholesterolemia OSA on CPAP   Past Surgical History: Left knee laproscopy-- 1998 Right Toe surgery-- 1995 Ulcer sugery at age 42 (?partial gastrectomy) deviated  septum, rhinoplasty   s/p appendectomy at age 2  Social History: 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[place] Never Smoked Alcohol use-no  Regular exercise-no oldest daughter 41 - donna Daughter is 32 and grandchildren live in [place] son 37 - tim From [place]--moved to [place] 2001 Pets -dog  carpet in home (26yrs old) no unusal hobbies   Review of Systems      See HPI  The patient denies anorexia, fever, weight loss, vision loss, decreased hearing, hoarseness, peripheral edema, prolonged cough, headaches, hemoptysis, abdominal pain, melena, hematochezia, severe indigestion/heartburn, hematuria, incontinence, muscle weakness, suspicious skin lesions, transient blindness, difficulty walking, depression, unusual weight change, abnormal bleeding, enlarged lymph nodes, and angioedema.    Vital Signs:  Patient profile:   60 year old female  Height:      63.5 inches Weight:      164.8 pounds BMI:     28.84 O2 Sat:      98 % on Room air Temp:     97.6 degrees F oral Pulse rate:   104 / minute BP sitting:   110 / 82  (left arm) Cuff size:   regular  Vitals Entered By: Zackery Barefoot CMA (June 18, 2010 8:52 AM)  O2 Flow:  Room air  Serial Vital Signs/Assessments:  Comments: Ambulatory Pulse Oximetry  Resting; HR__77___    02 Sat__98%RA___  Lap1 (185 feet)   HR__107___   02 Sat__98%RA___ Lap2 (185 feet)   HR__98___   02 Sat__98%RA___    Lap3 (185 feet)   HR__93___   02 Sat__98%RA___  _x__Test Completed without Difficulty Zackery Barefoot CMA  June 18, 2010 10:48 AM  ___Test Kem Parkinson due to:   By: Zackery Barefoot CMA   CC: Pt c/o increased SOB all the time x 2 to 3 weeks, Abdominal Pain Comments Medications reviewed with patient Verified contact number and pharmacy with patient Zackery Barefoot CMA  June 18, 2010 8:52 AM    Physical Exam  Additional Exam:  GEN: A/Ox3; pleasant , NAD HEENT:  [place]/AT, , EACs-clear, TMs-wnl,  NOSE pale, clear drainage , THROAT-clear,upper denture plate, few dental caries/fillings noted on bottom  NECK:  Supple w/ fair ROM; no JVD; normal carotid impulses w/o bruits; no thyromegaly or nodules palpated; no lymphadenopathy. RESP  Clear to P & A; w/o, wheezes/ rales/ or rhonchi. CARD:  RRR, no m/r/g   GI:   Soft & nt; nml bowel sounds; no organomegaly or masses detected. Musco: Warm bil,  no calf tenderness edema, clubbing, pulses intact Neuro:  intact w/ no focal deficits noted.    Impression & Recommendations:  Problem # 1:  DYSPNEA (ICD-786.05) Progressively worsening Dyspnea/DOE over last year/worse x 4 months ? etiology.  It has been presumed asthma however has seen no improvement with dulera  I have requested hospital records . I am concerned that she has a ? syncopal episode w/ associated  dyspnea/chest pain. IT is unclear if she underwent stress test . She has several risk factors w/ HTN/CHol/DM/FH. May need referral to cardiology. Will check bnp today, if elevated may need echo.  XRAy pending. May need CT chest in future but will hold for now, review records and xray today.  will review findings and decide on next step.  follow up 2 weeks Dr. Vassie Loll    Orders: TLB-CBC Platelet - w/Differential ([id number]) TLB-TSH (Thyroid Stimulating Hormone) [phone]) New Patient Level IV (24401)  Problem # 2:  ASTHMA (ICD-493.90) Unclear if she has underlying asthma Did have some improvment on simple office spirometry w/ post SABA  will look at hospital records she believe she had formal PFT She has had no improvement w/ Dulera will change to Advair for now.  May need to evaluate for underlying sinus issues as well.  will add saline nasal rinses and veramyst  follow up 2 weeks.   Problem # 3:  OBSTRUCTIVE SLEEP APNEA (ICD-327.23)  cont on cpap  return to discuss w/ Dr. Vassie Loll   Orders: New Patient Level IV [phone])  Medications Added to Medication List This Visit: 1)   Advair Diskus 250-50 Mcg/dose Aepb (Fluticasone-salmeterol) .Marland Kitchen.. 1 puff two times a day 2)  Veramyst 27.5 Mcg/spray Susp (Fluticasone furoate) .Marland Kitchen.. 1 puff two times a day 3)  Metformin Hcl 500 Mg Tabs (Metformin  hcl) .... 1 tab by mouth two times a day  Complete Medication List: 1)  Advair Diskus 250-50 Mcg/dose Aepb (Fluticasone-salmeterol) .Marland Kitchen.. 1 puff two times a day 2)  Singulair 10 Mg Tabs (Montelukast sodium) .... One tablet by mouth once daily 3)  Veramyst 27.5 Mcg/spray Susp (Fluticasone furoate) .Marland Kitchen.. 1 puff two times a day 4)  Glimepiride 1 Mg Tabs (Glimepiride) .... 1/2 tab by mouth q am 5)  Metformin Hcl 500 Mg Tabs (Metformin hcl) .Marland Kitchen.. 1 tab by mouth two times a day 6)  Lipitor 10 Mg Tabs (Atorvastatin calcium) .... Take 1 tablet by mouth once a day 7)  Losartan Potassium-hctz 50-12.5 Mg Tabs (Losartan potassium-hctz) .... One half tablet by mouth once daily 8)  Sertraline Hcl 50 Mg Tabs (Sertraline hcl) .... One tablet by mouth once daily 9)  Omeprazole 20 Mg Cpdr (Omeprazole) .... Take 1 capsule by mouth two times a day. 10)  Loratadine 10 Mg Tabs (Loratadine) .... Take 2 tablets by mouth once a day. 11)  Aspir-low 81 Mg Tbec (Aspirin) .... Take 1 tablet by mouth once a day. 12)  Centrum Silver Tabs (Multiple vitamins-minerals) .... One tab by mouth once daily 13)  Proair Hfa 108 (90 Base) Mcg/act Aers (Albuterol sulfate) .... Take 2 puffs every 4 hours as needed. 14)  Keflex 500 Mg Caps (Cephalexin) .... 2 caps by mouth twice daily for 10 days (for your toe)  Other Orders: T-2 View CXR (71020TC) TLB-BMP (Basic Metabolic Panel-BMET) ([id number]-[id number]) TLB-BNP (B-Natriuretic Peptide) (83880-BNPR)  Patient Instructions: 1)  Stop Dulera 2)  Begin Advair 250/50 1 puff two times a day , very important to rinse/brush/gargle after inhaler use.  3)  Stress reducers.  4)  Increase Omeprazole 20mg  two times a day before meal.  5)  GERD diet.  6)  Saline nasal rinses two times a day    7)  Veramyst 1 puff two times a day -sample given.  8)  follow up 2 weeks [place] Dr. Vassie Loll

## 2010-08-13 NOTE — Assessment & Plan Note (Signed)
Summary: 2 week follow up/mhf--rm5   Vital Signs:  Patient profile:   60 year old female Height:      63.5 inches Weight:      166.50 pounds BMI:     29.14 Temp:     97.7 degrees F oral Pulse rate:   84 / minute Pulse rhythm:   regular Resp:     16 per minute BP sitting:   112 / 76  (right arm) Cuff size:   large  Vitals Entered By: Mervin Kung CMA Duncan Dull) (June 14, 2010 8:12 AM) CC: Pt here for 2 week f/u. States she was unable to stop Losartan HCT because she needs the portion to help with fluid. Had recent panic attack. Is Patient Diabetic? Yes Pain Assessment Patient in pain? no      Comments Pt has completed Amoxicillin. Sertraline is still 50mg  1 once daily until completing current supply then will take 25mg  1/2 daily to wean off. Nicki Guadalajara Fergerson CMA Duncan Dull)  June 14, 2010 8:25 AM    Primary Care Tram Wrenn:  Dondra Spry DO  CC:  Pt here for 2 week f/u. States she was unable to stop Losartan HCT because she needs the portion to help with fluid. Had recent panic attack..  History of Present Illness: Ms Givhan is a 60 year old female who presents today for follow up of her acute sinusitus.    1)Sinusitis- Pt reports that she completed antibiotics, cough is resolved and sinus congestion resolved.  Energy is back to normal.    2)Asthma- + Wheezing with exercise. Reports continuing to use Dulera but now needing proair 2-3x a day  3)Anxiety-  She was cut back on her dose of zoloft, notes deterioration in her anxiety,  Feels nervous and stressed.  Reports that she felt good when she was on the zoloft 50 mg- calmer and better able to handle stress.  4)Left great toe pain-  Pt notes several day history of pain in the left great toe.  Wants this evaluated    Allergies: 1)  ! Compazine 2)  ! * Tetanus  Past History:  Past Medical History: Last updated: 05/14/2010 Asthma Diabetes 2- diagnosed in 2003 Fainting spells secondary to asthma Hx of stomach  ulcer as a teenager HTN  Hypercholesterolemia Hyperlipidemia Hypertension   Past Surgical History: Last updated: 05/14/2010 Left knee laproscopy-- 1998 Right Toe surgery-- 1995 Ulcer sugery at age 74 (?partial gastrectomy) deviated septum, rhinoplasty      Review of Systems       see HPI  Physical Exam  General:  Well-developed,well-nourished,in no acute distress; alert,appropriate and cooperative throughout examination Head:  Normocephalic and atraumatic without obvious abnormalities. No apparent alopecia or balding. Lungs:  Normal respiratory effort, chest expands symmetrically. Lungs are clear to auscultation, no crackles or wheezes. Heart:  Normal rate and regular rhythm. S1 and S2 normal without gallop, murmur, click, rub or other extra sounds. Extremities:  Left Great toe with mild swelling, erythema surrounding nail at tip of toe.  No swelling noted otherwise of either LE. Psych:  Oriented X3, memory intact for recent and remote, and normally interactive.  Mildly anxious appearing   Impression & Recommendations:  Problem # 1:  ASTHMA (ICD-493.90) Assessment Deteriorated Will add Singulair to her regimen.  Spirometry was reviewed today.  FEV1 improved form 80% to 102% following administration of bronchodilator.  Plan to continue [facility] and PRN pro-air.  Will add Singulair to her regimen.  Samples provided, lot [id number] exp  NOV 2013 #24 Requests referral to pulmonary- has previously been followed at [facility] Her updated medication list for this problem includes:    Dulera 200-5 Mcg/act Aero (Mometasone furo-formoterol fum) .Marland Kitchen... Take 2 puffs every 12 hours    Proair Hfa 108 (90 Base) Mcg/act Aers (Albuterol sulfate) .Marland Kitchen... Take 2 puffs every 4 hours as needed.    Singulair 10 Mg Tabs (Montelukast sodium) ..... One tablet by mouth once daily  Orders: Spirometry (Pre & Post) [phone]) Pulmonary Referral (Pulmonary) Nebulizer Tx (60454) Albuterol Sulfate Sol 1mg  unit dose  (U9811)  Problem # 2:  CELLULITIS, GREAT TOE, LEFT (ICD-681.10) Assessment: New Suspect early cellulitis of the great toe.  Will plan to treat with Keflex.   Her updated medication list for this problem includes:       Keflex 500 Mg Caps (Cephalexin) .Marland Kitchen... 2 caps by mouth twice daily for 10 days (for your toe)  Problem # 3:  DEPRESSION/ANXIETY (ICD-300.4) Assessment: Deteriorated Anxiety is deteriorated.  Will place back on Zoloft 50.  25 minutes was spent with patient.  Greater than 50% of this time was spent counseling patient on her anxiety.  Problem # 4:  SINUSITIS, ACUTE (ICD-461.9) Assessment: Improved Symptoms resolved.   Her updated medication list for this problem includes:    Amoxicillin 500 Mg Caps (Amoxicillin) .Marland Kitchen... 2 caps by mouth three times a day for 10 days    Keflex 500 Mg Caps (Cephalexin) .Marland Kitchen... 2 caps by mouth twice daily for 10 days (for your toe)  Complete Medication List: 1)  Glimepiride 1 Mg Tabs (Glimepiride) .... 1/2 tab by mouth q am 2)  Lipitor 10 Mg Tabs (Atorvastatin calcium) .... Take 1 tablet by mouth once a day 3)  Dulera 200-5 Mcg/act Aero (Mometasone furo-formoterol fum) .... Take 2 puffs every 12 hours 4)  Proair Hfa 108 (90 Base) Mcg/act Aers (Albuterol sulfate) .... Take 2 puffs every 4 hours as needed. 5)  Losartan Potassium-hctz 50-12.5 Mg Tabs (Losartan potassium-hctz) .... One half tablet by mouth once daily 6)  Sertraline Hcl 50 Mg Tabs (Sertraline hcl) .... One tablet by mouth once daily 7)  Metformin Hcl 500 Mg Tabs (Metformin hcl) .... 1/2 by mouth two times a day x 5 days, then 1 tab by mouth two times a day 8)  Moviprep 100 Gm Solr (Peg-kcl-nacl-nasulf-na asc-c) .... As per prep instructions. 9)  Amoxicillin 500 Mg Caps (Amoxicillin) .... 2 caps by mouth three times a day for 10 days 10)  Loratadine 10 Mg Tabs (Loratadine) .... Take 2 tablets by mouth once a day. 11)  Aspir-low 81 Mg Tbec (Aspirin) .... Take 1 tablet by mouth once a  day. 12)  Omeprazole 20 Mg Cpdr (Omeprazole) .... Take 1 capsule by mouth two times a day. 13)  Centrum Silver Tabs (Multiple vitamins-minerals) .... One tab by mouth once daily 14)  Singulair 10 Mg Tabs (Montelukast sodium) .... One tablet by mouth once daily 15)  Keflex 500 Mg Caps (Cephalexin) .... 2 caps by mouth twice daily for 10 days (for your toe)  Patient Instructions: 1)  Please follow up in 1-2 weeks- sooner if you develop increased, pain, swelling, redness of your toe. 2)  Have a nice Holiday! Prescriptions: KEFLEX 500 MG CAPS (CEPHALEXIN) 2 caps by mouth twice daily for 10 days (for your toe)  #40 x 0   Entered and Authorized by:   Lemont Fillers FNP   Signed by:   Lemont Fillers FNP on 06/14/2010   Method  used:   Electronically to        [facility]* (retail)       [address] [place], [place]  [address]       Ph: [phone]       Fax: [phone]   RxID:   [id number] SERTRALINE HCL 50 MG TABS (SERTRALINE HCL) one tablet by mouth once daily  #90 x 0   Entered and Authorized by:   Lemont Fillers FNP   Signed by:   Lemont Fillers FNP on 06/14/2010   Method used:   Electronically to        [facility]* (retail)       [address] [place], [place]  [address]       Ph: [phone]       Fax: [phone]   RxID:   [id number] SINGULAIR 10 MG TABS (MONTELUKAST SODIUM) one tablet by mouth once daily  #90 x 0   Entered and Authorized by:   Lemont Fillers FNP   Signed by:   Lemont Fillers FNP on 06/14/2010   Method used:   Electronically to        [facility]* (retail)       [address] [place], [place]  [address]       Ph: [phone]       Fax: [phone]   RxID:   [id number]    Medication Administration  Medication # 1:    Medication: Albuterol Sulfate Sol 1mg  unit dose    Diagnosis: ASTHMA  (ICD-493.90)    Dose: 2.5mg  / 3mL    Route: inhaled    Exp Date: 01/11/2011    Lot #: [id number]    Mfr: Nephron    Patient tolerated medication without complications    Given by: Mervin Kung CMA Duncan Dull) (June 14, 2010 9:54 AM)  Orders Added: 1)  Spirometry (Pre & Post) [94060] 2)  Pulmonary Referral [Pulmonary] 3)  Nebulizer Tx [62694] 4)  Albuterol Sulfate Sol 1mg  unit dose [W5462] 5)  Est. Patient Level IV [70350]    Current Allergies (reviewed today): ! COMPAZINE ! * TETANUS

## 2010-08-13 NOTE — Assessment & Plan Note (Signed)
Summary: 1 month fu/dt   Vital Signs:  Patient profile:   60 year old female Height:      63.5 inches Weight:      166.75 pounds BMI:     29.18 O2 Sat:      98 % on Room air Temp:     97.7 degrees F oral Pulse rate:   83 / minute Pulse rhythm:   regular Resp:     20 per minute BP sitting:   130 / 80  (right arm) Cuff size:   large  Vitals Entered By: Glendell Docker CMA (May 14, 2010 9:07 AM)  O2 Flow:  Room air CC: 1 month follow up, Hypertension Management, Type 2 diabetes mellitus follow-up Is Patient Diabetic? Yes Did you bring your meter with you today? No Pain Assessment Patient in pain? no        Primary Care Renatta Shrieves:  Dondra Spry DO  CC:  1 month follow up, Hypertension Management, and Type 2 diabetes mellitus follow-up.  History of Present Illness:  Type 2 Diabetes Mellitus Follow-Up      This is a 60 year old woman who presents for Type 2 diabetes mellitus follow-up.  The patient denies self managed hypoglycemia and hypoglycemia requiring help.  The patient denies the following symptoms: chest pain.  Since the last visit the patient reports good dietary compliance, compliance with medications, and monitoring blood glucose.  low blood sugar 97 high blood sugar 545- check blood sugars sporadically, avg 170's   htn - stable  right heel discomfort for the past 2-3 days more walking than usual   Hypertension History:      Positive major cardiovascular risk factors include female age 75 years old or older, diabetes, hyperlipidemia, and hypertension.  Negative major cardiovascular risk factors include non-tobacco-user status.    Preventive Screening-Counseling & Management  Alcohol-Tobacco     Smoking Status: never  Allergies: 1)  ! Compazine 2)  ! * Tetanus  Past History:  Past Medical History: Asthma Diabetes 2- diagnosed in 2003 Fainting spells secondary to asthma Hx of stomach ulcer as a teenager HTN   Hypercholesterolemia Hyperlipidemia Hypertension   Past Surgical History: Left knee laproscopy-- 1998 Right Toe surgery-- 1995 Ulcer sugery at age 25 (?partial gastrectomy) deviated septum, rhinoplasty      Family History: Mother-- Diabetes Type II, heart prob.; deceased in her early 69's Father-- Bleeding ulcer, deceased  late seventies 4 kids-- 3 healthy, 1 died at 6 wks (heart defects, perforated bowel)     Social History: 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[place] Never Smoked Alcohol use-no  Regular exercise-no oldest daughter 47 - donna Daughter is 24 and grandchildren live in [place] son 23 - tim  Review of Systems  The patient denies chest pain and prolonged cough.    Physical Exam  General:  alert, well-developed, and well-nourished.   Head:  normocephalic and atraumatic.   Mouth:  pharynx pink and moist.   Neck:  No deformities, masses, or tenderness noted.no carotid bruits.   Lungs:  normal respiratory effort and normal breath sounds.   Heart:  normal rate, regular rhythm, and no gallop.   Extremities:  trace left pedal edema and trace right pedal edema.   Neurologic:  cranial nerves II-XII intact and gait normal.   Psych:  normally interactive and good eye contact.    Diabetes Management Exam:    Foot Exam (with socks and/or shoes not present):  Inspection:          Left foot: normal          Right foot: normal    Eye Exam:       Eye Exam done elsewhere          Date: 05/14/2010          Results: No diabetic retinopathy          Done by: [facility]   Impression & Recommendations:  Problem # 1:  DIABETES MELLITUS, TYPE II (ICD-250.00) hypoglycemia resolved with lower dose of glimepiride.  slowly start metformin  Her updated medication list for this problem includes:    Glimepiride 1 Mg Tabs (Glimepiride) .Marland Kitchen... 1/2 tab by mouth q am    Losartan Potassium-hctz 50-12.5 Mg Tabs (Losartan potassium-hctz) .....  One by mouth once daily    Metformin Hcl 500 Mg Tabs (Metformin hcl) .Marland Kitchen... 1/2 by mouth two times a day x 5 days, then 1 tab by mouth two times a day  Labs Reviewed: Creat: 0.76 (04/12/2010)     Last Eye Exam: No diabetic retinopathy (05/14/2010) Reviewed HgBA1c results: 6.3 (04/15/2010)  Problem # 2:  HYPERTENSION (ICD-401.9) tolerating losartan well.  Maintain current medication regimen.  less risk of aggravating asthma  Her updated medication list for this problem includes:    Losartan Potassium-hctz 50-12.5 Mg Tabs (Losartan potassium-hctz) ..... One by mouth once daily  BP today: 130/80 Prior BP: 100/70 (04/12/2010)  10 Yr Risk Heart Disease: 15 %  Labs Reviewed: K+: 4.4 (04/12/2010) Creat: : 0.76 (04/12/2010)   Chol: 169 (04/12/2010)   HDL: 46 (04/12/2010)   LDL: 97 (04/12/2010)   TG: 131 (04/12/2010)  Problem # 3:  PLANTAR FASCIITIS (ICD-728.71) reviewed stretching exercises  advised use of arch support inserts if symptoms get worse, refer to podiatrist  Complete Medication List: 1)  Glimepiride 1 Mg Tabs (Glimepiride) .... 1/2 tab by mouth q am 2)  Lipitor 10 Mg Tabs (Atorvastatin calcium) .... Take 1 tablet by mouth once a day 3)  Dulera 200-5 Mcg/act Aero (Mometasone furo-formoterol fum) .... Take 2 puffs every 12 hours 4)  Proair Hfa 108 (90 Base) Mcg/act Aers (Albuterol sulfate) .... Take 2 puffs every 4 hours as needed. 5)  Losartan Potassium-hctz 50-12.5 Mg Tabs (Losartan potassium-hctz) .... One by mouth once daily 6)  Sertraline Hcl 25 Mg Tabs (Sertraline hcl) .... 1/2 tab by mouth qd 7)  Metformin Hcl 500 Mg Tabs (Metformin hcl) .... 1/2 by mouth two times a day x 5 days, then 1 tab by mouth two times a day 8)  Moviprep 100 Gm Solr (Peg-kcl-nacl-nasulf-na asc-c) .... As per prep instructions.  Hypertension Assessment/Plan:      The patient's hypertensive risk group is category C: Target organ damage and/or diabetes.  Her calculated 10 year risk of coronary  heart disease is 15 %.  Today's blood pressure is 130/80.     Patient Instructions: 1)  Please schedule a follow-up appointment in 3 months. 2)  BMP prior to visit, ICD-9:   401.9 3)  HbgA1C prior to visit, ICD-9:  250.00 4)  Please return for lab work one (1) week before your next appointment.  Prescriptions: LOSARTAN POTASSIUM-HCTZ 50-12.5 MG TABS (LOSARTAN POTASSIUM-HCTZ) one by mouth once daily  #90 x 1   Entered and Authorized by:   D. Thomos Lemons DO   Signed by:   D. Thomos Lemons DO on 05/14/2010   Method used:   Electronically to  [facility]* (retail)       [address] [place], [place]  [address]       Ph: [phone]       Fax: [phone]   RxID:   [id number] SERTRALINE HCL 25 MG TABS (SERTRALINE HCL) 1/2 tab by mouth qd  #30 x 0   Entered and Authorized by:   D. Thomos Lemons DO   Signed by:   D. Thomos Lemons DO on 05/14/2010   Method used:   Electronically to        [facility]* (retail)       [address] [place], [place]  [address]       Ph: [phone]       Fax: [phone]   RxID:   [id number] METFORMIN HCL 500 MG TABS (METFORMIN HCL) 1/2 by mouth two times a day x 5 days, then 1 tab by mouth two times a day  #60 x 2   Entered and Authorized by:   D. Thomos Lemons DO   Signed by:   D. Thomos Lemons DO on 05/14/2010   Method used:   Electronically to        [facility]* (retail)       [address] [place], [place]  [address]       Ph: [phone]       Fax: [phone]   RxID:   [id number]    Orders Added: 1)  Est. Patient Level IV [51761]     Orders Added: 1)  Est. Patient Level IV [60737]   Current Allergies (reviewed today): ! COMPAZINE ! * TETANUS

## 2010-08-13 NOTE — Assessment & Plan Note (Signed)
Summary: congested/dt   Vital Signs:  Patient profile:   60 year old female Weight:      166.25 pounds BMI:     29.09 O2 Sat:      95 % on Room air Temp:     98.2 degrees F oral Pulse rate:   69 / minute Resp:     18 per minute BP sitting:   104 / 70  (right arm) Cuff size:   regular  Vitals Entered By: Glendell Docker CMA (May 27, 2010 3:43 PM)  O2 Flow:  Room air CC: Head Congestion Is Patient Diabetic? Yes Pain Assessment Patient in pain? no      Comments c/o head congestion for the past week, taken dayquil and nyquil  with no relief, non productive cough,denies temp at home, nausea no vomiting   Primary Care Provider:  Dondra Spry DO  CC:  Head Congestion.  History of Present Illness: Crystal [name] is a 60 year old female who presents with sinus congestion for approximately 1 week.  Nasal discharge is clear to cloudy.  Energy is low.  Denies fever.  + dry cough.  Has tried dayquil and nyquil without improvement.  "I have the crud."  Allergies: 1)  ! Compazine 2)  ! * Tetanus  Past History:  Past Medical History: Last updated: 05/14/2010 Asthma Diabetes 2- diagnosed in 2003 Fainting spells secondary to asthma Hx of stomach ulcer as a teenager HTN  Hypercholesterolemia Hyperlipidemia Hypertension   Past Surgical History: Last updated: 05/14/2010 Left knee laproscopy-- 1998 Right Toe surgery-- 1995 Ulcer sugery at age 24 (?partial gastrectomy) deviated septum, rhinoplasty      Review of Systems       C. history of present illness  Physical Exam  General:  Tired appearing female, awake, alert and in NAD Head:  + maxillary and frontal sinus tenderness. Ears:  R TM is perforated (old) L TM normal Mouth:  upper denture plate,   no pharyngeal erythema Lungs:  Normal respiratory effort, chest expands symmetrically. Lungs are clear to auscultation, no crackles or wheezes. Heart:  Normal rate and regular rhythm. S1 and S2 normal without gallop,  murmur, click, rub or other extra sounds.   Impression & Recommendations:  Problem # 1:  SINUSITIS, ACUTE (ICD-461.9) Assessment New we'll plan to treat patient for acute sinusitis with amoxicillin. She was instructed to call for followup as noted and patient instructions. Her updated medication list for this problem includes:    Amoxicillin 500 Mg Caps (Amoxicillin) .Marland Kitchen... 2 caps by mouth three times a day for 10 days  Problem # 2:  HYPERTENSION (ICD-401.9) Assessment: Comment Only the patient's blood pressure today is considerably lower than her baseline blood pressure. I have encouraged her to drink plenty of fluids. She tells me that she does not take a full tablet of the losartan HCTZ, but has been taking a half tablet of this medication "for years". I have instructed her to check her blood pressure once daily and to hold this medication if her pressure is less than 110/80. I've also asked her to followup with Dr. Artist Pais in 2 weeks. Her updated medication list for this problem includes:    Losartan Potassium-hctz 50-12.5 Mg Tabs (Losartan potassium-hctz) ..... One half tablet by mouth once daily  BP today: 104/70 Prior BP: 130/80 (05/14/2010)  Prior 10 Yr Risk Heart Disease: 15 % (05/14/2010)  Labs Reviewed: K+: 4.4 (04/12/2010) Creat: : 0.76 (04/12/2010)   Chol: 169 (04/12/2010)   HDL: 46 (  04/12/2010)   LDL: 97 (04/12/2010)   TG: 131 (04/12/2010)  Complete Medication List: 1)  Glimepiride 1 Mg Tabs (Glimepiride) .... 1/2 tab by mouth q am 2)  Lipitor 10 Mg Tabs (Atorvastatin calcium) .... Take 1 tablet by mouth once a day 3)  Dulera 200-5 Mcg/act Aero (Mometasone furo-formoterol fum) .... Take 2 puffs every 12 hours 4)  Proair Hfa 108 (90 Base) Mcg/act Aers (Albuterol sulfate) .... Take 2 puffs every 4 hours as needed. 5)  Losartan Potassium-hctz 50-12.5 Mg Tabs (Losartan potassium-hctz) .... One half tablet by mouth once daily 6)  Sertraline Hcl 25 Mg Tabs (Sertraline hcl) .... 1/2  tab by mouth qd 7)  Metformin Hcl 500 Mg Tabs (Metformin hcl) .... 1/2 by mouth two times a day x 5 days, then 1 tab by mouth two times a day 8)  Moviprep 100 Gm Solr (Peg-kcl-nacl-nasulf-na asc-c) .... As per prep instructions. 9)  Amoxicillin 500 Mg Caps (Amoxicillin) .... 2 caps by mouth three times a day for 10 days  Patient Instructions: 1)  Make sure that you are drinking enough fluid while you are sick (6-8 glasses of water a day). 2)  Check your blood pressure once a day- Do not take Losartan HCTZ (your blood pressure medication) if your blood pressure is less than 110/80.   3)  Please follow up with Dr. Artist Pais in 2 weeks. 4)  Call if you develop fever over 101, increasing sinus pressure, pain with eye movement, increased facial tenderness of swelling, or if you develop visual changes. Prescriptions: AMOXICILLIN 500 MG CAPS (AMOXICILLIN) 2 caps by mouth three times a day for 10 days  #60 x 0   Entered and Authorized by:   Lemont Fillers FNP   Signed by:   Lemont Fillers FNP on 05/27/2010   Method used:   Electronically to        [facility]* (retail)       [address] [place], [place]  [address]       Ph: [phone]       Fax: [phone]   RxID:   [id number]    Orders Added: 1)  Est. Patient Level III [27253]    Current Allergies (reviewed today): ! COMPAZINE ! * TETANUS

## 2010-08-15 ENCOUNTER — Ambulatory Visit: Payer: Self-pay | Admitting: Internal Medicine

## 2010-08-15 ENCOUNTER — Ambulatory Visit: Admit: 2010-08-15 | Payer: Self-pay | Admitting: Internal Medicine

## 2010-08-15 NOTE — Assessment & Plan Note (Signed)
Summary: [facility]   Visit Type:  Initial Consult Primary Provider:  Dondra Spry DO  CC:  Abnormal EKG.  History of Present Illness: 60 year old female for evaluation of palpitations, dyspnea and abnormal electrocardiogram. Nuclear study 12/20/11showed an ejection fraction of 75% and normal perfusion. Patient was admitted to [facility] in July of 2011 with syncope. Enzymes negative. Echocardiogram showed normal LV function and mild aortic insufficiency. Carotid Dopplers showed 0-39% bilateral stenosis. Patient has a long history of dyspnea on exertion and apparently has been diagnosed with asthma and sleep apnea there is no orthopnea, PND, pedal edema or chest pain. She also has palpitations that she has had intermittently for 30 years. They are sudden in onset and described as a flutter. It lasts seconds. There is no associated symptoms. She also has a history of multiple syncopal episodes in the past. These have been intermittent for years as well. She feels queasy and warm followed by syncope. Because of her palpitations we were asked to further evaluate.  Current Medications (verified): 1)  Advair Diskus 250-50 Mcg/dose Aepb (Fluticasone-Salmeterol) .Marland Kitchen.. 1 Puff Two Times A Day 2)  Singulair 10 Mg Tabs (Montelukast Sodium) .... One Tablet By Mouth Once Daily 3)  Veramyst 27.5 Mcg/spray Susp (Fluticasone Furoate) .Marland Kitchen.. 1 Puff Two Times A Day 4)  Metformin Hcl 500 Mg Tabs (Metformin Hcl) .Marland Kitchen.. 1 Tab By Mouth Two Times A Day 5)  Lipitor 10 Mg Tabs (Atorvastatin Calcium) .... Take 1 Tablet By Mouth Once A Day 6)  Losartan Potassium-Hctz 50-12.5 Mg Tabs (Losartan Potassium-Hctz) .... One Half Tablet By Mouth Once Daily 7)  Sertraline Hcl 50 Mg Tabs (Sertraline Hcl) .... One Tablet By Mouth Once Daily 8)  Zegerid Otc 20-1100 Mg Caps (Omeprazole-Sodium Bicarbonate) .... One Cap By Mouth Daily 9)  Loratadine 10 Mg Tabs (Loratadine) .... Take 2 Tablets By Mouth Once A Day. 10)   Aspir-Low 81 Mg Tbec (Aspirin) .... Take 1 Tablet By Mouth Once A Day. 11)  Centrum Silver  Tabs (Multiple Vitamins-Minerals) .... One Tab By Mouth Once Daily 12)  Proair Hfa 108 (90 Base) Mcg/act Aers (Albuterol Sulfate) .... Take 2 Puffs Every 4 Hours As Needed. 13)  Nystatin 100000 Unit/ml Susp (Nystatin) .... 5ml Swish and Swallow 4 Times Daily For One Week 14)  Tussionex Pennkinetic Er 10-8 Mg/25ml Lqcr (Hydrocod Polst-Chlorphen Polst) .... 5ml By Mouth Every 12 Hours As Needed For Cough  Allergies: 1)  ! Compazine 2)  ! * Tetanus  Past History:  Past Medical History: HYPERTENSION HYPERLIPIDEMIA DYSPEPSIA OBSTRUCTIVE SLEEP APNEA PLANTAR FASCIITIS  DEPRESSION/ANXIETY  Asthma --02/08/10 FEV1 1.98 (80%), s/p saba 2.22l/m (90%).nml Diabetes 2- diagnosed in 2003 Hx of stomach ulcer as a teenager  Past Surgical History: Reviewed history from 06/18/2010 and no changes required. Left knee laproscopy-- 1998 Right Toe surgery-- 1995 Ulcer sugery at age 44 (?partial gastrectomy) deviated septum, rhinoplasty   s/p appendectomy at age 83  Family History: Reviewed history from 05/14/2010 and no changes required. Mother-- Diabetes Type II, MI approximatedly age 61's.; deceased in her early 16's Father-- Bleeding ulcer, deceased  late seventies 4 kids-- 3 healthy, 1 died at 6 wks (heart defects, perforated bowel)     Social History: Reviewed history from 06/18/2010 and no changes required. 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[place] Never Smoked Alcohol use-no  Regular exercise-no oldest daughter 83 - donna Daughter is 18 and grandchildren live in [place] son 2 - tim From [place]--moved to  [place] 2001 Pets -dog  carpet in home (64yrs old) no unusal hobbies   Review of Systems       Toe pain from recent surgery and anxiety but no fevers or chills, productive cough, hemoptysis, dysphasia, odynophagia, melena, hematochezia, dysuria, hematuria,  rash, seizure activity, orthopnea, PND, pedal edema, claudication. Remaining systems are negative.   Vital Signs:  Patient profile:   60 year old female Height:      63.5 inches Weight:      165.50 pounds BMI:     28.96 Pulse rate:   80 / minute Pulse rhythm:   regular Resp:     18 per minute BP sitting:   114 / 80  (left arm) Cuff size:   large  Vitals Entered By: Vikki Ports (July 24, 2010 10:01 AM)  Physical Exam  General:  Well developed/well nourished in NAD Skin warm/dry Patient not depressed No peripheral clubbing Back-normal HEENT-normal/normal eyelids Neck supple/normal carotid upstroke bilaterally; no bruits; no JVD; no thyromegaly chest - CTA/ normal expansion CV - RRR/normal S1 and S2; no murmurs, rubs or gallops;  PMI nondisplaced Abdomen -NT/ND, no HSM, no mass, + bowel sounds, no bruit; previous abdominal surgery 2+ femoral pulses, no bruits Ext-no edema, chords, 2+ DP Neuro-grossly nonfocal     EKG  Procedure date:  07/19/2010  Findings:      Normal sinus rhythm with no ST changes.  Impression & Recommendations:  Problem # 1:  PALPITATIONS (ICD-785.1) These symptoms are chronic and not particularly bothersome. I offered a CardioNet today. However she apparently had this previously and declined a repeat study. If her symptoms worsen we will pursue a CardioNet and possible beta blocker at that point. Her updated medication list for this problem includes:    Aspir-low 81 Mg Tbec (Aspirin) .Marland Kitchen... Take 1 tablet by mouth once a day.  Problem # 2:  ABNORMAL ELECTROCARDIOGRAM (ICD-794.31) Previous electrocardiogram reviewed and is normal. Her updated medication list for this problem includes:    Aspir-low 81 Mg Tbec (Aspirin) .Marland Kitchen... Take 1 tablet by mouth once a day.  Problem # 3:  DYSPNEA (ICD-786.05) Previous echocardiogram showed normal LV function and myoview was normal as well. No further cardiac evaluation. Her updated medication list for this  problem includes:    Losartan Potassium-hctz 50-12.5 Mg Tabs (Losartan potassium-hctz) ..... One half tablet by mouth once daily    Aspir-low 81 Mg Tbec (Aspirin) .Marland Kitchen... Take 1 tablet by mouth once a day.  Problem # 4:  HYPERTENSION (ICD-401.9) Continue present blood pressure medications. Her updated medication list for this problem includes:    Losartan Potassium-hctz 50-12.5 Mg Tabs (Losartan potassium-hctz) ..... One half tablet by mouth once daily    Aspir-low 81 Mg Tbec (Aspirin) .Marland Kitchen... Take 1 tablet by mouth once a day.  Problem # 5:  HYPERLIPIDEMIA (ICD-272.4) Management per primary care. Her updated medication list for this problem includes:    Lipitor 10 Mg Tabs (Atorvastatin calcium) .Marland Kitchen... Take 1 tablet by mouth once a day  Problem # 6:  OBSTRUCTIVE SLEEP APNEA (ICD-327.23)  Problem # 7:  DIABETES MELLITUS, TYPE II (ICD-250.00)  Her updated medication list for this problem includes:    Metformin Hcl 500 Mg Tabs (Metformin hcl) .Marland Kitchen... 1 tab by mouth two times a day    Losartan Potassium-hctz 50-12.5 Mg Tabs (Losartan potassium-hctz) ..... One half tablet by mouth once daily    Aspir-low 81 Mg Tbec (Aspirin) .Marland Kitchen... Take 1 tablet by mouth once a day.

## 2010-08-15 NOTE — Assessment & Plan Note (Signed)
Summary: 1 week follow up/mhf--Rm 5   Vital Signs:  Patient profile:   60 year old female Height:      63.5 inches Weight:      168.25 pounds BMI:     29.44 O2 Sat:      94 % on Room air Temp:     97.7 degrees F oral Pulse rate:   78 / minute Pulse rhythm:   regular Resp:     18 per minute BP sitting:   128 / 78  (right arm) Cuff size:   regular  Vitals Entered By: Mervin Kung CMA Duncan Dull) (July 12, 2010 8:03 AM)  O2 Flow:  Room air CC: Pt here for 1 week f/u.  Left great toe still tender and red. Has had cough, congestion and shortness of breath x 1 week. Is Patient Diabetic? Yes Pain Assessment Patient in pain? no      Comments Pt agrees all med doses and directions are correct. Nicki Guadalajara Fergerson CMA [facility] Dull)  July 12, 2010 8:09 AM    Primary Care Provider:  Dondra Spry DO  CC:  Pt here for 1 week f/u.  Left great toe still tender and red. Has had cough and congestion and shortness of breath x 1 week.Marland Kitchen  History of Present Illness: Crystal [name] is a 60 year old female who presents today with chief complaint of cough.  1. Cough-Cough has been present x 1 week and is associated with congestion. Cough is worsened by nothing, cough is improved slightly by a cough syrup that she has at home from a former provider.  Minimal improvement with equate OTC cold and sinus.   Notes low grade temp about 99.   + nausea, but no vomitting or diarrhea. Feels like tongue is "coated"  2. Left great toe cellulitis- Shei s on the 7th day of doxycycline for her left great toe.  Still tender to the touch.  Unchanged.  Prior to doxycycline she used keflex which helped with the swelling but did not eradicate the cellulitus.   Allergies: 1)  ! Compazine 2)  ! * Tetanus  Past History:  Past Medical History: Last updated: 06/25/2010 Asthma?  --02/08/10 FEV1 1.98 (80%), s/p saba 2.22l/m (90%).nml Diabetes 2- diagnosed in 2003 ?syncope - 02/2010 Hx of stomach ulcer as a  teenager HTN  Hypercholesterolemia OSA on CPAP  Syncope 02/07/10-admitted [facility] , cardiace enzymes nml, 2 D echo nml LVF, nml PAP, carotid dopplers neg  Past Surgical History: Last updated: 06/18/2010 Left knee laproscopy-- 1998 Right Toe surgery-- 1995 Ulcer sugery at age 55 (?partial gastrectomy) deviated septum, rhinoplasty   s/p appendectomy at age 82  Review of Systems       see HPI  Physical Exam  General:  Well-developed,well-nourished,in no acute distress; alert,appropriate and cooperative throughout examination Head:  Normocephalic and atraumatic without obvious abnormalities. No apparent alopecia or balding. Ears:  R TM is perforated (old) L TM normal Mouth:  Tongue appears red/irritated without lesions Neck:  No deformities, masses, or tenderness noted. Lungs:  Normal respiratory effort, chest expands symmetrically. Lungs are clear to auscultation, no crackles or wheezes. Heart:  Normal rate and regular rhythm. S1 and S2 normal without gallop, murmur, click, rub or other extra sounds. Extremities:  L.  Great toes with mild erythema surrounding nail bed and at distal portion of toe.  No open lesions or draingage, no significant swelling is noted.  Tender to touch Psych:  Cognition and judgment appear intact. Alert and cooperative  with normal attention span and concentration. No apparent delusions, illusions, hallucinations   Impression & Recommendations:  Problem # 1:  CELLULITIS, GREAT TOE, LEFT (ICD-681.10) Assessment Unchanged  Will plan referral to podiatry.  Will also have patient stop the doxycycline and place on clinda and bactrim x 7 days.   The following medications were removed from the medication list:    Doxycycline Hyclate 100 Mg Tabs (Doxycycline hyclate) ..... One tablet by mouth two times a day for 10 days Her updated medication list for this problem includes:    Bactrim 400-80 Mg Tabs (Sulfamethoxazole-trimethoprim) ..... One tablet by mouth two times a  day x 7 days    Clindamycin Hcl 300 Mg Caps (Clindamycin hcl) ..... One cap by mouth every 6 hours for 1 week  Orders: Podiatry Referral (Podiatry)  Problem # 2:  COUGH (ICD-786.2) Assessment: New CXR neg for acute infiltrate.   Orders: CXR- 2view (CXR)  Complete Medication List: 1)  Advair Diskus 250-50 Mcg/dose Aepb (Fluticasone-salmeterol) .Marland Kitchen.. 1 puff two times a day 2)  Singulair 10 Mg Tabs (Montelukast sodium) .... One tablet by mouth once daily 3)  Veramyst 27.5 Mcg/spray Susp (Fluticasone furoate) .Marland Kitchen.. 1 puff two times a day 4)  Metformin Hcl 500 Mg Tabs (Metformin hcl) .Marland Kitchen.. 1 tab by mouth two times a day 5)  Lipitor 10 Mg Tabs (Atorvastatin calcium) .... Take 1 tablet by mouth once a day 6)  Losartan Potassium-hctz 50-12.5 Mg Tabs (Losartan potassium-hctz) .... One half tablet by mouth once daily 7)  Sertraline Hcl 50 Mg Tabs (Sertraline hcl) .... One tablet by mouth once daily 8)  Omeprazole 20 Mg Cpdr (Omeprazole) .... Take 1 capsule by mouth two times a day. 9)  Loratadine 10 Mg Tabs (Loratadine) .... Take 2 tablets by mouth once a day. 10)  Aspir-low 81 Mg Tbec (Aspirin) .... Take 1 tablet by mouth once a day. 11)  Centrum Silver Tabs (Multiple vitamins-minerals) .... One tab by mouth once daily 12)  Proair Hfa 108 (90 Base) Mcg/act Aers (Albuterol sulfate) .... Take 2 puffs every 4 hours as needed. 13)  Nystatin 100000 Unit/ml Susp (Nystatin) .... 5ml swish and swallow 4 times daily for one week 14)  Tussionex Pennkinetic Er 10-8 Mg/37ml Lqcr (Hydrocod polst-chlorphen polst) .... 5ml by mouth every 12 hours as needed for cough 15)  Bactrim 400-80 Mg Tabs (Sulfamethoxazole-trimethoprim) .... One tablet by mouth two times a day x 7 days 16)  Clindamycin Hcl 300 Mg Caps (Clindamycin hcl) .... One cap by mouth every 6 hours for 1 week  Patient Instructions: 1)  Please complete your chest x-ray downstairs. 2)  Follow up in 1 week. Prescriptions: CLINDAMYCIN HCL 300 MG CAPS  (CLINDAMYCIN HCL) one cap by mouth every 6 hours for 1 week  #28 x 0   Entered and Authorized by:   Lemont Fillers FNP   Signed by:   Lemont Fillers FNP on 07/12/2010   Method used:   Print then Give to Patient   RxID:   [id number] BACTRIM 400-80 MG TABS (SULFAMETHOXAZOLE-TRIMETHOPRIM) one tablet by mouth two times a day x 7 days  #14 x 0   Entered and Authorized by:   Lemont Fillers FNP   Signed by:   Lemont Fillers FNP on 07/12/2010   Method used:   Print then Give to Patient   RxID:   [id number] TUSSIONEX PENNKINETIC ER 10-8 MG/5ML LQCR (HYDROCOD POLST-CHLORPHEN POLST) 5ml by mouth every 12 hours as needed for cough  #  120 x 0   Entered and Authorized by:   Lemont Fillers FNP   Signed by:   Lemont Fillers FNP on 07/12/2010   Method used:   Print then Give to Patient   RxID:   [id number] NYSTATIN 100000 UNIT/ML SUSP (NYSTATIN) 5ml swish and swallow 4 times daily for one week  #1 bottle x 0   Entered and Authorized by:   Lemont Fillers FNP   Signed by:   Lemont Fillers FNP on 07/12/2010   Method used:   Electronically to        [facility]* (retail)       [address] [place], [place]  [address]       Ph: [phone]       Fax: [phone]   RxID:   [id number]    Orders Added: 1)  CXR- 2view [CXR] 2)  Podiatry Referral [Podiatry] 3)  Est. Patient Level III [25956]    Current Allergies (reviewed today): ! COMPAZINE ! * TETANUS

## 2010-08-15 NOTE — Assessment & Plan Note (Signed)
Summary: 1 week follow up/mhf--rm 5   Vital Signs:  Patient profile:   60 year old female Height:      63.5 inches Weight:      168.50 pounds BMI:     29.49 Temp:     97.5 degrees F oral Pulse rate:   96 / minute Pulse rhythm:   regular Resp:     16 per minute BP sitting:   116 / 70  (right arm) Cuff size:   large  Vitals Entered By: Mervin Kung CMA [facility] Dull) (July 19, 2010 10:26 AM) CC: Pt here for 1 week follow up Is Patient Diabetic? Yes Pain Assessment Patient in pain? no      Comments Pt has completed Bactrim. Pt states she uses Advair very little. Agrees all other med doses and directions are correct. Mervin Kung CMA [facility] Dull)  July 19, 2010 10:32 AM    Primary Care Provider:  Dondra Spry DO  CC:  Pt here for 1 week follow up.  History of Present Illness: Ms.  Sitzer is a 60 year old female who presents today for follow up.  1)Toe Cellulitis-  Notes that this is improving.  Has appointment next week with the podiatrist.  2)Indigestion- Notes "severe indigestion"  Feels like food justl sits there.  Notes post prandial gas/discomfort.  Has not let up for several days.  Has been taking maalox, mylanta- little improvement.   Was having some nausea last week, now just "queesy."  Appetite is poor- making herself eat.  Continues omeprazole.  BM's regular- mild constipation.  Denies chest pain.    Allergies: 1)  ! Compazine 2)  ! * Tetanus  Past History:  Past Medical History: Last updated: 06/25/2010 Asthma?  --02/08/10 FEV1 1.98 (80%), s/p saba 2.22l/m (90%).nml Diabetes 2- diagnosed in 2003 ?syncope - 02/2010 Hx of stomach ulcer as a teenager HTN  Hypercholesterolemia OSA on CPAP  Syncope 02/07/10-admitted [facility] , cardiace enzymes nml, 2 D echo nml LVF, nml PAP, carotid dopplers neg  Past Surgical History: Last updated: 06/18/2010 Left knee laproscopy-- 1998 Right Toe surgery-- 1995 Ulcer sugery at age 9 (?partial gastrectomy) deviated septum,  rhinoplasty   s/p appendectomy at age 1  Review of Systems       see HPI  Physical Exam  General:  Well-developed,well-nourished,in no acute distress; alert,appropriate and cooperative throughout examination Head:  Normocephalic and atraumatic without obvious abnormalities. No apparent alopecia or balding. Eyes:  PERRLA, sclear clear Ears:  External ear exam shows no significant lesions or deformities.  Otoscopic examination reveals clear canals, tympanic membranes are intact bilaterally without bulging, retraction, inflammation or discharge. Hearing is grossly normal bilaterally. Neck:  No deformities, masses, or tenderness noted. Lungs:  Normal respiratory effort, chest expands symmetrically. Lungs are clear to auscultation, no crackles or wheezes. Heart:  Normal rate and regular rhythm. S1 and S2 normal without gallop, murmur, click, rub or other extra sounds. Abdomen:  soft, non-tender, and normal bowel sounds. Mild distentionsoft, non-tender, normal bowel sounds, no masses, no guarding, and no rigidity.   Extremities:  Near resolution of erythema surrounding nail bed on left great toe.   Psych:  Cognition and judgment appear intact. Alert and cooperative with normal attention span and concentration. No apparent delusions, illusions, hallucinations   Impression & Recommendations:  Problem # 1:  DYSPEPSIA (ICD-536.8) Assessment New Will check labs as noted below.  If Abnormal,  will plan to perform abdominal ultrasound.  Gastroparesis is also a consideration given pt's  hx of DM.  Change omeprazole to zegerid.  F/u in 2 weeks.   Orders: TLB-CBC Platelet - w/Differential (85025-CBCD) TLB-Hepatic/Liver Function Pnl (80076-HEPATIC) T-Amylase (16109-60454) T-Lipase (09811-91478)  Problem # 2:  ABNORMAL ELECTROCARDIOGRAM (ICD-794.31) Assessment: New  Pt was noted to have a short PR interval on EKG today.  Had a nuclear stress test 12/11 which was negative.  Review of records notes  that she had several syncopal episodes over the summer.  Etiology was unclear.  ? intermittent tachycardia  Orders: Cardiology Referral (Cardiology)  Problem # 3:  CELLULITIS, GREAT TOE, LEFT (ICD-681.10) Assessment: Improved Pt to complete antibiotics and keep her upcoming apt with podiatry.   The following medications were removed from the medication list:    Bactrim 400-80 Mg Tabs (Sulfamethoxazole-trimethoprim) ..... One tablet by mouth two times a day x 7 days Her updated medication list for this problem includes:    Clindamycin Hcl 300 Mg Caps (Clindamycin hcl) ..... One cap by mouth every 6 hours for 1 week  Complete Medication List: 1)  Advair Diskus 250-50 Mcg/dose Aepb (Fluticasone-salmeterol) .Marland Kitchen.. 1 puff two times a day 2)  Singulair 10 Mg Tabs (Montelukast sodium) .... One tablet by mouth once daily 3)  Veramyst 27.5 Mcg/spray Susp (Fluticasone furoate) .Marland Kitchen.. 1 puff two times a day 4)  Metformin Hcl 500 Mg Tabs (Metformin hcl) .Marland Kitchen.. 1 tab by mouth two times a day 5)  Lipitor 10 Mg Tabs (Atorvastatin calcium) .... Take 1 tablet by mouth once a day 6)  Losartan Potassium-hctz 50-12.5 Mg Tabs (Losartan potassium-hctz) .... One half tablet by mouth once daily 7)  Sertraline Hcl 50 Mg Tabs (Sertraline hcl) .... One tablet by mouth once daily 8)  Zegerid Otc 20-1100 Mg Caps (Omeprazole-sodium bicarbonate) .... One cap by mouth daily 9)  Loratadine 10 Mg Tabs (Loratadine) .... Take 2 tablets by mouth once a day. 10)  Aspir-low 81 Mg Tbec (Aspirin) .... Take 1 tablet by mouth once a day. 11)  Centrum Silver Tabs (Multiple vitamins-minerals) .... One tab by mouth once daily 12)  Proair Hfa 108 (90 Base) Mcg/act Aers (Albuterol sulfate) .... Take 2 puffs every 4 hours as needed. 13)  Nystatin 100000 Unit/ml Susp (Nystatin) .... 5ml swish and swallow 4 times daily for one week 14)  Tussionex Pennkinetic Er 10-8 Mg/25ml Lqcr (Hydrocod polst-chlorphen polst) .... 5ml by mouth every 12 hours as  needed for cough 15)  Clindamycin Hcl 300 Mg Caps (Clindamycin hcl) .... One cap by mouth every 6 hours for 1 week  Patient Instructions: 1)  Please complete your lab work on the first floor today. 2)  Stop omeprazole, start Zegerid.  This is also over the counter. 3)  Eat small frequent meals. 4)  Complete your antibiotics. 5)  See Reflux handout. 6)  Keep your appointment with podiatry. 7)  Follow up in 2 weeks, sooner if symtoms worsen or do not improve.    Orders Added: 1)  TLB-CBC Platelet - w/Differential [85025-CBCD] 2)  TLB-Hepatic/Liver Function Pnl [80076-HEPATIC] 3)  T-Amylase [82150-23210] 4)  T-Lipase [83690-23215] 5)  Est. Patient Level III [29562] 6)  Cardiology Referral [Cardiology]    Current Allergies (reviewed today): ! COMPAZINE ! * TETANUS

## 2010-08-15 NOTE — Progress Notes (Signed)
Summary: CXR result  Phone Note Outgoing Call   Summary of Call: Please call patient.  Let her know that CXR is negative.  I would like her to stop doxycycline.  Start bactrim and clindamycin which I have sent to her pharmacy.  I would also like her to add a probiotic supplement OTC such as Flora Q one cap by mouth daily to help protect her stomach with the antibiotics.  I have also made a referral for her to see a podiatrist for her toe.   Initial call taken by: Lemont Fillers FNP,  July 12, 2010 11:41 AM  Follow-up for Phone Call        Pt notified and voices understanding. Nicki Guadalajara Fergerson CMA Duncan Dull)  July 12, 2010 11:58 AM

## 2010-08-15 NOTE — Letter (Signed)
Summary: Date Range: 02/21/10 to 04/25/10/[facility]  Date Range: 02/21/10 to 04/25/10/[facility]   Imported By: Sherian Rein 07/18/2010 14:59:52  _____________________________________________________________________  External Attachment:    Type:   Image     Comment:   External Document

## 2010-08-15 NOTE — Progress Notes (Signed)
Summary: lab results  Phone Note Outgoing Call   Call placed by: Lemont Fillers FNP,  July 22, 2010 8:40 AM Call placed to: Patient Summary of Call: Left message for patient to return our call to let us know how she is feeling. She is noted to have a slight elevation of her liver function tests.  If she is not feeling better, we should do an abdominal ultrasound.  If she is feeling better we can hold off on any further abdominal imaging.  I have asked her to return our call. Initial call taken by: Lemont Fillers FNP,  July 22, 2010 8:43 AM  Follow-up for Phone Call        Spoke to pt and she states that she is feeling better. Advised pt of liver function tests and to follow up as scheduled or earlier if needed.  Nicki Guadalajara Fergerson CMA [facility] Dull)  July 23, 2010 8:19 AM

## 2010-08-15 NOTE — Assessment & Plan Note (Signed)
Summary: 2 week follow up in HP//jwr   Visit Type:  Initial Consult Primary Kron Everton/Referring Taneisha Fuson:  Dondra Spry DO   History of Present Illness: 60 yo female with known hx of HTN, DM and Hyperlipidmia presented for initial pulmonary consult on June 18, 2010 for evaluation of uncontrolled ' asthma. '  June 18, 2010--DOE x 4 mnths. she was hospitalized at [facility] 4 months ago after she was at work, went  walking for exercise came back felt very short of breath, had chest pain and says she passed out x 2,  Says she had breathing test that showed she had asthma. Was referred to [facility] where she was started on [facility] but has had no improvement and use proair 3 x She works fulltime in [employer], no change in house. has 1 indoor pet no unusual hobbies. No known hx of asthma in past or childhood. No family hx of Asthma. Under a lot of stress , husband has stage 4 cancer. She does have sinus congestion, drainage intermittent and does have some intermittent GERD despite PPI daily. Recently started on singulair .   July 02, 2010 3:48 PM  Prefers advair to [place], Much improved ? due to advair or veramyst Reviewed spirometry >> Baseline shows restriction with FEV1 80% (but nml ratio), improved to 102 % post BD. Stress test nml Reviewed records from [facility] >>AHI 37 corrected by CPAP 11 cm, oronasal mask (Triad resp) - c/o nasal iritation RAST neg- IgE 23  Denies chest pain, orthopnea, hemoptysis, fever, n/v/d, edema, headache. Traveled last month to [place]. No leg swelling or calf pain.      Preventive Screening-Counseling & Management  Alcohol-Tobacco     Alcohol drinks/day: 0     Smoking Status: never  Current Medications (verified): 1)  Advair Diskus 250-50 Mcg/dose Aepb (Fluticasone-Salmeterol) .Marland Kitchen.. 1 Puff Two Times A Day 2)  Singulair 10 Mg Tabs (Montelukast Sodium) .... One Tablet By Mouth Once Daily 3)  Veramyst 27.5  Mcg/spray Susp (Fluticasone Furoate) .Marland Kitchen.. 1 Puff Two Times A Day 4)  Metformin Hcl 500 Mg Tabs (Metformin Hcl) .Marland Kitchen.. 1 Tab By Mouth Two Times A Day 5)  Lipitor 10 Mg Tabs (Atorvastatin Calcium) .... Take 1 Tablet By Mouth Once A Day 6)  Losartan Potassium-Hctz 50-12.5 Mg Tabs (Losartan Potassium-Hctz) .... One Half Tablet By Mouth Once Daily 7)  Sertraline Hcl 50 Mg Tabs (Sertraline Hcl) .... One Tablet By Mouth Once Daily 8)  Omeprazole 20 Mg Cpdr (Omeprazole) .... Take 1 Capsule By Mouth Two Times A Day. 9)  Loratadine 10 Mg Tabs (Loratadine) .... Take 2 Tablets By Mouth Once A Day. 10)  Aspir-Low 81 Mg Tbec (Aspirin) .... Take 1 Tablet By Mouth Once A Day. 11)  Centrum Silver  Tabs (Multiple Vitamins-Minerals) .... One Tab By Mouth Once Daily 12)  Proair Hfa 108 (90 Base) Mcg/act Aers (Albuterol Sulfate) .... Take 2 Puffs Every 4 Hours As Needed.  Allergies (verified): 1)  ! Compazine 2)  ! * Tetanus  Past History:  Past Medical History: Last updated: 06/25/2010 Asthma?  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  The patient denies anorexia, fever, weight loss, weight gain, vision loss, decreased hearing, hoarseness, chest pain, syncope, peripheral edema, prolonged cough, headaches, hemoptysis, abdominal pain, melena, hematochezia,  severe indigestion/heartburn, hematuria, muscle weakness, suspicious skin lesions, difficulty walking, depression, unusual weight change, abnormal bleeding, enlarged lymph nodes, and angioedema.    Vital Signs:  Patient profile:   60 year old female Height:      63.5 inches Weight:      166 pounds BMI:     29.05 O2 Sat:      98 % on Room air Temp:     97.8 degrees F oral Pulse rate:   77 / minute BP sitting:   112 / 70  (left arm) Cuff size:   regular  Vitals Entered By: Zackery Barefoot CMA (July 02, 2010 3:18 PM)  O2 Flow:  Room air Comments Medications reviewed with patient Verified contact number and pharmacy with patient Zackery Barefoot CMA  July 02, 2010 3:19 PM    Physical Exam  Additional Exam:  GEN: A/Ox3; pleasant , NAD HEENT:  [place]/AT, , EACs-clear, TMs-wnl, NOSE pale, clear drainage , THROAT-clear,upper denture plate, few dental caries/fillings noted on bottom  NECK:  Supple w/ fair ROM; no JVD; normal carotid impulses w/o bruits; no thyromegaly or nodules palpated; no lymphadenopathy. RESP  Clear to P & A; w/o, wheezes/ rales/ or rhonchi. CARD:  RRR, no m/r/g   [place]: Warm bil,  no calf tenderness edema, clubbing, pulses intact    Impression & Recommendations:  Problem # 1:  ASTHMA (ICD-493.90) Doubt true asthma - favor upper airway / post nasal drip as cause Stay on advair, singulair & veramyst x 3 months If better, then step down  Problem # 2:  OBSTRUCTIVE SLEEP APNEA (ICD-327.23) Stay on CPAP 11 cm, trial of full face mask Orders: Est. Patient Level IV ([address]) Prescription Created Electronically [phone]) DME Referral (DME)  Medications Added to Medication List This Visit: 1)  Advair Diskus 250-50 Mcg/dose Aepb (Fluticasone-salmeterol) .Marland Kitchen.. 1 puff two times a day  Patient Instructions: 1)  Copy sent to: Dr Artist Pais 2)  Please schedule a follow-up appointment in 2 months with TP 3)  Stress test normal 4)  Stay on advair, singulair & veramyst x 3  months 5)  If better, we will taper down 6)  Stay on CPAP 11 cm, we will get you a new mask Prescriptions: ADVAIR DISKUS 250-50 MCG/DOSE AEPB (FLUTICASONE-SALMETEROL) 1 puff two times a day  #1 x 2   Entered and Authorized by:   Comer Locket Vassie Loll MD   Signed by:   Comer Locket Vassie Loll MD on 07/02/2010   Method used:   Electronically to        [facility]* (retail)       [address] [place], [place]  [address]       Ph: [phone]       Fax: [phone]   RxID:   [id number]

## 2010-08-15 NOTE — Progress Notes (Signed)
  Phone Note Outgoing Call   Details for Reason: x-ray results Summary of Call: Called patient, reviewed normal x-ray results. She is due for diagnostic mammogram per Letter that she brings from [facility].  She will sign a medical release so that we can schedule her at [facility]. Initial call taken by: Lemont Fillers FNP,  July 05, 2010 9:14 AM

## 2010-08-15 NOTE — Progress Notes (Signed)
  Phone Note Other Incoming   Request: Send information Summary of Call: Records received from [facility] and [facility]. 34 pages forwarded to Dr. Vassie Loll for review.

## 2010-08-15 NOTE — Letter (Signed)
Summary: [facility]   Imported By: Sherian Rein 07/01/2010 07:38:26  _____________________________________________________________________  External Attachment:    Type:   Image     Comment:   External Document

## 2010-08-15 NOTE — Assessment & Plan Note (Signed)
Summary: 1 WEEK FOLLOW UP/MMHF--Rm 5   Vital Signs:  Patient profile:   60 year old female Height:      63.5 inches Weight:      166.50 pounds BMI:     29.14 Temp:     97.4 degrees F oral Pulse rate:   84 / minute Pulse rhythm:   regular Resp:     16 per minute BP sitting:   130 / 82  (right arm) Cuff size:   regular  Vitals Entered By: Mervin Kung CMA Duncan Dull) (June 21, 2010 3:10 PM) CC: Pt here for 1 week follow up. Is Patient Diabetic? Yes Comments Pt agrees all med doses and directions are correct. Nicki Guadalajara Fergerson CMA Duncan Dull)  June 21, 2010 3:17 PM    Primary Care Dominque Marlin:  Dondra Spry DO  CC:  Pt here for 1 week follow up.Marland Kitchen  History of Present Illness: Ms. Crystal [name] is a 60 year old female who presents today for follow up.  1. Left great toe celluliits- Pt feels that it is improving, now less tender.  Denies Fever.  2.Asthma-  She was placed on advair and nasal spray.  Notes improvement in her breathing.   Has f/u with Dr. Vassie Loll on 12/20.    3. Anxiety- back on zoloft x 1 week.  Today she learned that her husband who has lung cancer now has a brain met.  They are very disappointed to learn this.    4. DM- she reports that she has had some symptomatic lows down 80 since her metformin was cut back to 500mg  two times a day.  Allergies: 1)  ! Compazine 2)  ! * Tetanus  Past History:  Past Medical History: Last updated: 06/18/2010 Asthma Diabetes 2- diagnosed in 2003 ?syncope - 02/2010 Hx of stomach ulcer as a teenager HTN  Hypercholesterolemia OSA on CPAP   Past Surgical History: Last updated: 06/18/2010 Left knee laproscopy-- 1998 Right Toe surgery-- 1995 Ulcer sugery at age 9 (?partial gastrectomy) deviated septum, rhinoplasty   s/p appendectomy at age 24  Review of Systems       see HPI  Physical Exam  General:  Well-developed,well-nourished,in no acute distress; alert,appropriate and cooperative throughout examination Head:   Normocephalic and atraumatic without obvious abnormalities. No apparent alopecia or balding. Neck:  No deformities, masses, or tenderness noted.  Lungs:  Normal respiratory effort, chest expands symmetrically. Lungs are clear to auscultation, no crackles or wheezes. Heart:  Normal rate and regular rhythm. S1 and S2 normal without gallop, murmur, click, rub or other extra sounds. Extremities:  L great toe, decreased area of erythema.  No LE edema Psych:  Oriented X3 and memory intact for recent and remote.  MIldly anxious appearing.   Impression & Recommendations:  Problem # 1:  CELLULITIS, GREAT TOE, LEFT (ICD-681.10) Assessment Improved Clinically improving.  Recommended that patient complete the 10 day rx.  Call if symptoms worsen or if not completely resolved when she completes antibiotics.   Her updated medication list for this problem includes:    Keflex 500 Mg Caps (Cephalexin) .Marland Kitchen... 2 caps by mouth twice daily for 10 days (for your toe)  Problem # 2:  ASTHMA (ICD-493.90) Assessment: Improved Symptoms are clinicaly improved.  Pulmonary team is suspicious that her symptoms may be cardiac given her hx of syncope 4 months ago.  Will go ahead and order an exercise stress test.   Her updated medication list for this problem includes:    Advair Diskus  250-50 Mcg/dose Aepb (Fluticasone-salmeterol) .Marland Kitchen... 1 puff two times a day    Singulair 10 Mg Tabs (Montelukast sodium) ..... One tablet by mouth once daily    Proair Hfa 108 (90 Base) Mcg/act Aers (Albuterol sulfate) .Marland Kitchen... Take 2 puffs every 4 hours as needed.  Problem # 3:  DIABETES MELLITUS, TYPE II (ICD-250.00) Assessment: Deteriorated Patient notes symptomatic hypoglycemia.  Will D/C glimepiride.  Continue decreased dose of metformin.   The following medications were removed from the medication list:    Glimepiride 1 Mg Tabs (Glimepiride) .Marland Kitchen... 1/2 tab by mouth q am Her updated medication list for this problem includes:    Metformin  Hcl 500 Mg Tabs (Metformin hcl) .Marland Kitchen... 1 tab by mouth two times a day    Losartan Potassium-hctz 50-12.5 Mg Tabs (Losartan potassium-hctz) ..... One half tablet by mouth once daily    Aspir-low 81 Mg Tbec (Aspirin) .Marland Kitchen... Take 1 tablet by mouth once a day.  Complete Medication List: 1)  Advair Diskus 250-50 Mcg/dose Aepb (Fluticasone-salmeterol) .Marland Kitchen.. 1 puff two times a day 2)  Singulair 10 Mg Tabs (Montelukast sodium) .... One tablet by mouth once daily 3)  Veramyst 27.5 Mcg/spray Susp (Fluticasone furoate) .Marland Kitchen.. 1 puff two times a day 4)  Metformin Hcl 500 Mg Tabs (Metformin hcl) .Marland Kitchen.. 1 tab by mouth two times a day 5)  Lipitor 10 Mg Tabs (Atorvastatin calcium) .... Take 1 tablet by mouth once a day 6)  Losartan Potassium-hctz 50-12.5 Mg Tabs (Losartan potassium-hctz) .... One half tablet by mouth once daily 7)  Sertraline Hcl 50 Mg Tabs (Sertraline hcl) .... One tablet by mouth once daily 8)  Omeprazole 20 Mg Cpdr (Omeprazole) .... Take 1 capsule by mouth two times a day. 9)  Loratadine 10 Mg Tabs (Loratadine) .... Take 2 tablets by mouth once a day. 10)  Aspir-low 81 Mg Tbec (Aspirin) .... Take 1 tablet by mouth once a day. 11)  Centrum Silver Tabs (Multiple vitamins-minerals) .... One tab by mouth once daily 12)  Proair Hfa 108 (90 Base) Mcg/act Aers (Albuterol sulfate) .... Take 2 puffs every 4 hours as needed. 13)  Keflex 500 Mg Caps (Cephalexin) .... 2 caps by mouth twice daily for 10 days (for your toe)  Other Orders: Misc. Referral (Misc. Ref)   Orders Added: 1)  Misc. Referral [Misc. Ref] 2)  Est. Patient Level IV [308[address]]     Current Allergies (reviewed today): ! COMPAZINE ! * TETANUS

## 2010-08-15 NOTE — Miscellaneous (Signed)
Summary: mammogram  Clinical Lists Changes  Observations: Added new observation of MAMMOGRAM: abnormal left breast. Bi-rad 3, probably benign finding. Stable small mass w/microcalcifications. f/u 6 months (08/09/2010 13:34)      Preventive Care Screening  Mammogram:    Date:  08/09/2010    Results:  abnormal left breast. Bi-rad 3, probably benign finding. Stable small mass w/microcalcifications. f/u 6 months

## 2010-08-15 NOTE — Letter (Signed)
Summary: Primary Care Consult Scheduled Letter  [place] [facility]  [address]   [place], [place] [address]   Phone: [phone]  Fax: [phone]      08/05/2010 MRN: [id number]  Chanetta Grega [address] [place], [place]  [address]    Dear Ms. Demetro,      We have scheduled an appointment for you.  At the recommendation of MELISSA O'SULLIVAN,FNP, we have scheduled you A MAMMOGRAM   on JANUARY  27,2012 at 11AM.  Their address is[facility]  [address]  [place]   [address]. The office phone number is [phone].  If this appointment day and time is not convenient for you, please feel free to call the office of the doctor you are being referred to at the number listed above and reschedule the appointment.     It is important for you to keep your scheduled appointments. We are here to make sure you are given good patient care.     Thank you, Darral Dash Patient Care Coordinator [facility]

## 2010-08-15 NOTE — Miscellaneous (Signed)
Summary: mammogram  Clinical Lists Changes  Observations: Added new observation of MAMMOGRAM: abnormal left. cluster microcalcifications anterior left breast; probably benign finding. F/u in 6 months (12/26/2009 15:49)      Preventive Care Screening  Mammogram:    Date:  12/26/2009    Results:  abnormal left. cluster microcalcifications anterior left breast; probably benign finding. F/u in 6 months

## 2010-08-15 NOTE — Assessment & Plan Note (Signed)
Summary: 1 month follow up/mhf--rm 5   Vital Signs:  Patient profile:   60 year old female Height:      63.5 inches Weight:      165.25 pounds BMI:     28.92 Temp:     97.5 degrees F oral Pulse rate:   72 / minute Pulse rhythm:   regular Resp:     18 per minute BP sitting:   126 / 84  (right arm) Cuff size:   regular  Vitals Entered By: Mervin Kung CMA Duncan Dull) (August 02, 2010 3:38 PM) CC: Pt here for 1 month follow up. Is Patient Diabetic? Yes Pain Assessment Patient in pain? no      Comments Pt no longer taking Tussionex and agrees all other med doses and directions are correct. Mervin Kung CMA Duncan Dull)  August 02, 2010 3:44 PM    Primary Care Provider:  Dondra Spry DO  CC:  Pt here for 1 month follow up.Marland Kitchen  History of Present Illness: Patient presents tody for follow up of her dyspepsia.    1) Dyspepsia- Notes that her symptoms are "much better" since she started zegerid.    2) Palpitations- resolved.    3) Labial pain/bleeding- Notes intermittent pain on her labia with some associated blood.  Has not had a Pap smear in 10-11 years.    Allergies: 1)  ! Compazine 2)  ! * Tetanus  Past History:  Past Medical History: Last updated: 07/24/2010 HYPERTENSION HYPERLIPIDEMIA DYSPEPSIA OBSTRUCTIVE SLEEP APNEA PLANTAR FASCIITIS  DEPRESSION/ANXIETY  Asthma --02/08/10 FEV1 1.98 (80%), s/p saba 2.22l/m (90%).nml Diabetes 2- diagnosed in 2003 Hx of stomach ulcer as a teenager  Past Surgical History: Last updated: 06/18/2010 Left knee laproscopy-- 1998 Right Toe surgery-- 1995 Ulcer sugery at age 12 (?partial gastrectomy) deviated septum, rhinoplasty   s/p appendectomy at age 88  Family History: Reviewed history from 07/24/2010 and no changes required. Mother-- Diabetes Type II, MI approximatedly age 32's.; deceased in her early 64's Father-- Bleeding ulcer, deceased  late seventies 4 kids-- 3 healthy, 1 died at 6 wks (heart defects, perforated  bowel)     Social History: Reviewed history from 06/18/2010 and no changes required. 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 No apparent alopecia or balding. Neck:  No deformities, masses, or tenderness noted. Lungs:  Normal respiratory effort, chest expands symmetrically. Lungs are clear to auscultation, no crackles or wheezes. Heart:  Normal rate and regular rhythm. S1 and S2 normal without gallop, murmur, click, rub or other extra sounds. Genitalia:  Pelvic Exam:        External: normal female genitalia without lesions or masses        Vagina: normal without lesions or masses        Cervix: normal without lesions or masses        Adnexa: normal bimanual exam without masses or fullness        Uterus: normal by palpation        Pap smear: performed Extremities:  Left great toe with decreased erythema.  Recently had a portion of her left toenail removed.   Impression & Recommendations:  Problem # 1:  MAMMOGRAM, ABNORMAL (ICD-793.80) Assessment Comment Only patient had an abnormal mammogram per her report in June of this year. She recently received a recall letter from [facility] for six-month followup. Report is currently unavailable, however will refer her for followup [facility] at [facility]. Orders: Misc. Referral (Misc. Ref)  Problem # 2:  UNSPEC SYMPTOM ASSOC W/FEMALE GENITAL ORGANS (ICD-625.9) Assessment: Comment Only Description of patient's intermittent labial pain  with bleeding is suggestive of herpes type II flareups. She has no known history of herpes however the symptoms have been intermittent for years. Will send titers for herpes to the lab to further evaluate. A Pap smear was also performed today she is very overdue. T- * Misc. Laboratory test [phone])  Problem # 3:  DYSPEPSIA (ICD-536.8) Assessment: Improved symptoms resolved since addition of Zegerid continue same.  Complete Medication List: 1)  Singulair 10 Mg Tabs (Montelukast sodium) .... One tablet by mouth once daily 2)  Veramyst 27.5 Mcg/spray Susp (Fluticasone furoate) .Marland Kitchen.. 1 puff two times a day 3)  Metformin Hcl 500 Mg Tabs (Metformin hcl) .Marland Kitchen.. 1 tab by mouth two times a day 4)  Lipitor 10 Mg Tabs (Atorvastatin calcium) .... Take 1 tablet by mouth once a day 5)  Losartan Potassium-hctz 50-12.5 Mg Tabs (Losartan potassium-hctz) .... One half tablet by mouth once daily 6)  Sertraline Hcl 50 Mg Tabs (Sertraline hcl) .... One tablet by mouth once daily 7)  Zegerid Otc 20-1100 Mg Caps (Omeprazole-sodium bicarbonate) .... One cap by mouth daily 8)  Loratadine 10 Mg Tabs (Loratadine) .... Take 2 tablets by mouth once a day. 9)  Aspir-low 81 Mg Tbec (Aspirin) .... Take 1 tablet by mouth once a day. 10)  Centrum Silver Tabs (Multiple vitamins-minerals) .... One tab by mouth once daily 11)  Proair Hfa 108 (90 Base) Mcg/act Aers (Albuterol sulfate) .... Take 2 puffs every 4 hours as needed. 12)  Nystatin 100000 Unit/ml Susp (Nystatin) .... 5ml swish and swallow 4 times daily for one week 13)  Tussionex Pennkinetic Er 10-8 Mg/77ml Lqcr (Hydrocod polst-chlorphen polst) .... 5ml by mouth every 12 hours as needed for cough  Other Orders: T-Hgb A1C ([id number]) Specimen Handling ([id number])  Patient Instructions: 1)  You will be contacted about your Pap smear results. 2)  Please complete your lab work today on the first floor. 3)  Follow up in 3 months.   Orders Added: 1)  T-Hgb A1C  [[id number]] 2)  T- * Misc. Laboratory test [99999] 3)  Specimen Handling [[id number]] 4)  Misc. Referral [Misc. Ref] 5)  Est. Patient Level III [30865]    Current Allergies (reviewed today): ! COMPAZINE ! * TETANUS

## 2010-08-15 NOTE — Progress Notes (Signed)
Summary: Sinus Congestion & Fever  Phone Note Call from Patient Call back at Home Phone [phone] Call back at [phone]   Caller: Spouse- Darcel Bayley  Call For: Lemont Fillers FNP Summary of Call: patient husband called to state patient has sinus congestion, fever for the past 3 days of 100, non-productive cough, sinus congestion, scratchy throat. Has takent over the counter, sinus congestion with some relief.  Initial call taken by: Glendell Docker CMA,  July 10, 2010 11:18 AM  Follow-up for Phone Call        Please have her schedule an OV.   You can double book this afternoon. Follow-up by: Lemont Fillers FNP,  July 10, 2010 11:34 AM  Additional Follow-up for Phone Call Additional follow up Details #1::        call returned to patient at [phone], no answer, no message left, call placed to  patients husband Darcel Bayley at [phone], he was informed per [facility] instructions. He states patient has an appointment for Friday with Melissa that she will keep. Additional Follow-up by: Glendell Docker CMA,  July 10, 2010 11:43 AM

## 2010-08-15 NOTE — Assessment & Plan Note (Signed)
Summary: Cardiology Nuclear Testing  Nuclear Med Background Indications for Stress Test: Evaluation for Ischemia   History: Asthma   Symptoms: Chest Pain, Palpitations    Nuclear Pre-Procedure Cardiac Risk Factors: Hypertension, Lipids, NIDDM Caffeine/Decaff Intake: none NPO After: 7:30 PM Lungs: clear IV 0.9% NS with Angio Cath: 22g     IV Site: R Hand IV Started by: Cathlyn Parsons, RN Chest Size (in) 36     Cup Size C     Height (in): 63.5 Weight (lb): 164 BMI: 28.70 Tech Comments: No metformin this am and BS 135 at 0845. Patient did inhalers this am  Nuclear Med Study 1 or 2 day study:  1 day     Stress Test Type:  Stress Reading MD:  Cassell Clement, MD     Referring MD:  R.Yoo Resting Radionuclide:  Technetium 63m Tetrofosmin     Resting Radionuclide Dose:  11 mCi  Stress Radionuclide:  Technetium 25m Tetrofosmin     Stress Radionuclide Dose:  33 mCi   Stress Protocol Exercise Time (min):  5:00 min     Max HR:  139 bpm     Predicted Max HR:  159 bpm  Max Systolic BP: 168 mm Hg     Percent Max HR:  87.42 %     METS: 7.0 Rate Pressure Product:  13086    Stress Test Technologist:  Milana Na, EMT-P     Nuclear Technologist:  Domenic Polite, CNMT  Rest Procedure  Myocardial perfusion imaging was performed at rest 45 minutes following the intravenous administration of Technetium 52m Tetrofosmin.  Stress Procedure  The patient exercised for 5:00. The patient stopped due to fatigue, sob, and denied any chest pain.  There were no significant ST-T wave changes and a rare pvc.  Technetium 48m Tetrofosmin was injected at peak exercise and myocardial perfusion imaging was performed after a brief delay.  QPS Raw Data Images:  Normal; no motion artifact; normal heart/lung ratio. Stress Images:  Normal homogeneous uptake in all areas of the myocardium.  Normal apical thinning. Rest Images:  Normal homogeneous uptake in all areas of the myocardium. Subtraction (SDS):   No evidence of ischemia. Transient Ischemic Dilatation:  .95  (Normal <1.22)  Lung/Heart Ratio:  .35  (Normal <0.45)  Quantitative Gated Spect Images QGS EDV:  64 ml QGS ESV:  16 ml QGS EF:  75 % QGS cine images:  No wall motion abnormalities.  Findings Normal nuclear study      Overall Impression  Exercise Capacity: Fair exercise capacity. BP Response: Normal blood pressure response. Clinical Symptoms: No chest pain.  Dyspneic. ECG Impression: No significant ST segment change suggestive of ischemia. Overall Impression: Normal stress nuclear study. Overall Impression Comments: No ischemia.  Vigorous LV systolic function.

## 2010-08-15 NOTE — Progress Notes (Signed)
Summary: Nuclear Pre-Procedure  Phone Note Outgoing Call Call back at [facility] Phone [phone]   Call placed by: Stanton Kidney, EMT-P,  July 01, 2010 2:45 PM Action Taken: Phone Call Completed Summary of Call: Left message with information on Myoview Information Sheet (see scanned document for details). Stanton Kidney, EMT-P  July 01, 2010 2:45 PM     Nuclear Med Background Indications for Stress Test: Evaluation for Ischemia   History: Asthma      Nuclear Pre-Procedure Cardiac Risk Factors: Hypertension, Lipids, NIDDM Height (in): 63.5

## 2010-08-15 NOTE — Assessment & Plan Note (Signed)
Summary: toe still sore and swollen/mhf--Rm 5   Vital Signs:  Patient profile:   60 year old female Height:      63.5 inches Weight:      166 pounds BMI:     29.05 Temp:     97.5 degrees F oral Pulse rate:   90 / minute Pulse rhythm:   regular Resp:     16 per minute BP sitting:   122 / 70  (right arm) Cuff size:   large  Vitals Entered By: Mervin Kung CMA Duncan Dull) (July 05, 2010 8:19 AM) CC: Pt states left great toe is some better but still very tender to touch. Is Patient Diabetic? Yes Comments Pt agrees all med doses and directions are correct. Nicki Guadalajara Fergerson CMA Duncan Dull)  July 05, 2010 8:26 AM    Primary Care Teona Vargus:  Dondra Spry DO  CC:  Pt states left great toe is some better but still very tender to touch..  History of Present Illness: Crystal [name] is a 60 year old female who presents today for follow up of her Left great toe cellulitis.  She was initially seen for this on 12/2 and was treated with a 10 day course of keflex which she completed.  She noted improvement in pain, and redness intially.  Now she reports that the redness and tenderness is returning, but it is not as severe as before.  She denies associated fever or drainage from toe.    Allergies: 1)  ! Compazine 2)  ! * Tetanus  Past History:  Past Medical History: Last updated: 06/25/2010 Asthma?  --02/08/10 FEV1 1.98 (80%), s/p saba 2.22l/m (90%).nml Diabetes 2- diagnosed in 2003 ?syncope - 02/2010 Hx of stomach ulcer as a teenager HTN  Hypercholesterolemia OSA on CPAP  Syncope 02/07/10-admitted [facility] , cardiace enzymes nml, 2 D echo nml LVF, nml PAP, carotid dopplers neg  Past Surgical History: Last updated: 06/18/2010 Left knee laproscopy-- 1998 Right Toe surgery-- 1995 Ulcer sugery at age 70 (?partial gastrectomy) deviated septum, rhinoplasty   s/p appendectomy at age 66  Review of Systems       see HPI  Physical Exam  General:  Well-developed,well-nourished,in no acute  distress; alert,appropriate and cooperative throughout examination Lungs:  Normal respiratory effort, chest expands symmetrically. Lungs are clear to auscultation, no crackles or wheezes. Heart:  Normal rate and regular rhythm. S1 and S2 normal without gallop, murmur, click, rub or other extra sounds. Extremities:  L.  Great toes with mild erythema surrounding nail bed and at distal portion of toe.  No open lesions or draingage, no significant swelling is noted.     Impression & Recommendations:  Problem # 1:  CELLULITIS, GREAT TOE, LEFT (ICD-681.10) Assessment Deteriorated Will plan to treat with doxycycline to cover MRSA.  Will also obtain an x-ray to rule out osteomyelitis.  Pt to f/u in 1 week, sooner if symtpoms worsen.   Her updated medication list for this problem includes:    Doxycycline Hyclate 100 Mg Tabs (Doxycycline hyclate) ..... One tablet by mouth two times a day for 10 days  Orders: T-DG Toe Great*L* [phone])  Complete Medication List: 1)  Advair Diskus 250-50 Mcg/dose Aepb (Fluticasone-salmeterol) .Marland Kitchen.. 1 puff two times a day 2)  Singulair 10 Mg Tabs (Montelukast sodium) .... One tablet by mouth once daily 3)  Veramyst 27.5 Mcg/spray Susp (Fluticasone furoate) .Marland Kitchen.. 1 puff two times a day 4)  Metformin Hcl 500 Mg Tabs (Metformin hcl) .Marland Kitchen.. 1 tab by mouth two times  a day 5)  Lipitor 10 Mg Tabs (Atorvastatin calcium) .... Take 1 tablet by mouth once a day 6)  Losartan Potassium-hctz 50-12.5 Mg Tabs (Losartan potassium-hctz) .... One half tablet by mouth once daily 7)  Sertraline Hcl 50 Mg Tabs (Sertraline hcl) .... One tablet by mouth once daily 8)  Omeprazole 20 Mg Cpdr (Omeprazole) .... Take 1 capsule by mouth two times a day. 9)  Loratadine 10 Mg Tabs (Loratadine) .... Take 2 tablets by mouth once a day. 10)  Aspir-low 81 Mg Tbec (Aspirin) .... Take 1 tablet by mouth once a day. 11)  Centrum Silver Tabs (Multiple vitamins-minerals) .... One tab by mouth once daily 12)   Proair Hfa 108 (90 Base) Mcg/act Aers (Albuterol sulfate) .... Take 2 puffs every 4 hours as needed. 13)  Doxycycline Hyclate 100 Mg Tabs (Doxycycline hyclate) .... One tablet by mouth two times a day for 10 days  Patient Instructions: 1)  Please complete your x-ray downstairs. 2)  Follow up in 1 week. 3)  Call if your symptoms worsen or if they do not improve. 4)  Have a Merry Christmas! Prescriptions: DOXYCYCLINE HYCLATE 100 MG TABS (DOXYCYCLINE HYCLATE) one tablet by mouth two times a day for 10 days  #20 x 0   Entered and Authorized by:   Lemont Fillers FNP   Signed by:   Lemont Fillers FNP on 07/05/2010   Method used:   Electronically to        [facility]* (retail)       [address] [place], [place]  [address]       Ph: [phone]       Fax: [phone]   RxID:   [id number]    Orders Added: 1)  T-DG Toe Great*L* [73660] 2)  Est. Patient Level III [[address]]    Current Allergies (reviewed today): ! COMPAZINE ! * TETANUS

## 2010-08-15 NOTE — Letter (Signed)
   [place] at [facility] [address] [place], [place]  [address]  [place] Phone: [phone]      August 06, 2010   [facility] [address] [place], [place] [address]  RE:  LAB RESULTS  Dear  Ms. Lantzy,  The following is an interpretation of your most recent lab tests.  Please take note of any instructions provided or changes to medications that have resulted from your lab work.  Pap Smear: normal      Sincerely Yours,    Lemont Fillers FNP  Appended Document:  mailed

## 2010-08-15 NOTE — Progress Notes (Signed)
----   Converted from flag ---- ---- 08/05/2010 3:36 PM, Mervin Kung CMA (AAMA) wrote: Per Reginold Agent @ [facility], test has been added.  ---- 08/05/2010 3:10 PM, Lemont Fillers FNP wrote: Could you please call the lab and see if they can specifically run a Herpes II IgG?  thanks ------------------------------

## 2010-08-15 NOTE — Letter (Signed)
Summary: Out of Work  Adult nurse at [employer]. [address]   [place], [place] [address]   Phone: [phone]  Fax: [phone]    July 12, 2010   Employee:  YOVANA SCOGIN    To Whom It May Concern:   For Medical reasons, please excuse the above named employee from work for the following dates:  Start:   12/30  End:   07/16/10  If you need additional information, please feel free to contact our office.         Sincerely,    Lemont Fillers FNP

## 2010-08-15 NOTE — Progress Notes (Signed)
  Phone Note Outgoing Call   Call placed by: Lemont Fillers FNP,  August 06, 2010 2:16 PM Call placed to: Patient Details for Reason: Lab results Summary of Call: Spoke with patient re:  +HSV2 IgG.  Offered suppressive therapy to the patient and she declines as incidence is infrequent of her breakouts.  She will consider treament during flares.  Reviewed Pap smear results- negative. Initial call taken by: Lemont Fillers FNP,  August 06, 2010 2:58 PM  New Problems: GENITAL HERPES (ICD-054.10)   New Problems: GENITAL HERPES (ICD-054.10)

## 2010-08-15 NOTE — Progress Notes (Signed)
Summary: Veramyst RX  Phone Note Call from Patient Call back at [facility] Phone [phone]   Caller: Patient Call For: Crystal [name] Summary of Call: pt needs a new rx for veramyst called [facility].  Initial call taken by: Tivis Ringer, CNA,  August 01, 2010 11:07 AM  Follow-up for Phone Call        Veramyst RX sent to pharmacy. LMOM so pt is aware and reminded her of f/u appt with TP on 08/27/2010.Michel Bickers CMA  August 01, 2010 12:01 PM    Prescriptions: VERAMYST 27.5 MCG/SPRAY SUSP (FLUTICASONE FUROATE) 1 puff two times a day  #1 x 0   Entered by:   Michel Bickers CMA   Authorized by:   Comer Locket. Vassie Loll MD   Signed by:   Michel Bickers CMA on 08/01/2010   Method used:   Electronically to        [facility]* (retail)       [address] [place], [place]  [address]       Ph: [phone]       Fax: [phone]   RxID:   [id number]

## 2010-08-15 NOTE — Miscellaneous (Signed)
Summary: Allergy Results/[facility]  Allergy Results/[facility]   Imported By: Sherian Rein 07/18/2010 14:56:52  _____________________________________________________________________  External Attachment:    Type:   Image     Comment:   External Document

## 2010-08-20 ENCOUNTER — Telehealth: Payer: Self-pay | Admitting: Family

## 2010-08-21 NOTE — Letter (Signed)
Summary: Reminder for Mammo/[facility]  Reminder for Mammo/[facility]   Imported By: Sherian Rein 08/13/2010 09:15:43  _____________________________________________________________________  External Attachment:    Type:   Image     Comment:   External Document

## 2010-08-27 ENCOUNTER — Encounter: Payer: Self-pay | Admitting: Adult Health

## 2010-08-27 ENCOUNTER — Ambulatory Visit (INDEPENDENT_AMBULATORY_CARE_PROVIDER_SITE_OTHER): Payer: BC Managed Care – PPO | Admitting: Adult Health

## 2010-08-27 DIAGNOSIS — J309 Allergic rhinitis, unspecified: Secondary | ICD-10-CM | POA: Insufficient documentation

## 2010-08-27 DIAGNOSIS — G4733 Obstructive sleep apnea (adult) (pediatric): Secondary | ICD-10-CM

## 2010-08-27 DIAGNOSIS — J45909 Unspecified asthma, uncomplicated: Secondary | ICD-10-CM

## 2010-08-29 NOTE — Progress Notes (Signed)
Summary: Rx transfer:: metformin, sertraline  Phone Note Refill Request Message from:  [facility] on August 20, 2010 4:43 PM  Refills Requested: Medication #1:  METFORMIN HCL 500 MG TABS 1 tab by mouth two times a day   Dosage confirmed as above?Dosage Confirmed   Supply Requested: 1 month  Medication #2:  SERTRALINE HCL 50 MG TABS one tablet by mouth once daily   Dosage confirmed as above?Dosage Confirmed   Supply Requested: 1 month Received message from pharmacist requesting new Rx on above meds as pt just transferred to their pharmacy.  Next Appointment Scheduled: none, Due 10/2010 Initial call taken by: Mervin Kung CMA Duncan Dull),  August 20, 2010 4:45 PM  Follow-up for Phone Call        Rxs called to pharmacist.  Follow-up by: Mervin Kung CMA Duncan Dull),  August 20, 2010 4:49 PM    Prescriptions: SERTRALINE HCL 50 MG TABS (SERTRALINE HCL) one tablet by mouth once daily  #30 x 2   Entered by:   Mervin Kung CMA (AAMA)   Authorized by:   Lemont Fillers FNP   Signed by:   Mervin Kung CMA (AAMA) on 08/20/2010   Method used:   Telephoned to ...       [facility] [phone] (retail)       [address]       [place], [place]  [address]  [place]       Ph: [phone]       Fax:    RxID:   [id number] METFORMIN HCL 500 MG TABS (METFORMIN HCL) 1 tab by mouth two times a day  #60 Each x 2   Entered by:   Mervin Kung CMA (AAMA)   Authorized by:   Lemont Fillers FNP   Signed by:   Mervin Kung CMA (AAMA) on 08/20/2010   Method used:   Telephoned to ...       [facility] [phone] (retail)       [address]       [place], [place]  [address]  [place]       Ph: [phone]       Fax:    RxID:   [id number]

## 2010-08-29 NOTE — Medication Information (Signed)
Summary: Diabetes Supplies/[facility]  Diabetes Supplies/[facility]   Imported By: Lanelle Bal 08/20/2010 14:09:22  _____________________________________________________________________  External Attachment:    Type:   Image     Comment:   External Document

## 2010-09-04 NOTE — Assessment & Plan Note (Signed)
Summary: 2 mon f/u in HP/jwr   Primary Provider/Referring Provider:  Dondra Spry DO  CC:  2 month followup, pt c/o gasping for air a little bit with rest and exertion, and states not using my advair like I should. Not using Adair every day only 2 -3 times a week. occasional cough non productive.  History of Present Illness: 60 yo female with known hx of HTN, DM and Hyperlipidmia presented for initial pulmonary consult on June 18, 2010 for evaluation of uncontrolled ' asthma. '  June 18, 2010--DOE x 4 mnths. she was hospitalized at [facility] 4 months ago after she was at work, went  walking for exercise came back felt very short of breath, had chest pain and says she passed out x 2,  Says she had breathing test that showed she had asthma. Was referred to [facility] where she was started on [facility] but has had no improvement and use proair 3 x She works fulltime in [employer], no change in house. has 1 indoor pet no unusual hobbies. No known hx of asthma in past or childhood. No family hx of Asthma. Under a lot of stress , husband has stage 4 cancer. She does have sinus congestion, drainage intermittent and does have some intermittent GERD despite PPI daily. Recently started on singulair .   July 02, 2010 3:48 PM  Prefers advair to [place], Much improved ? due to advair or veramyst Reviewed spirometry >> Baseline shows restriction with FEV1 80% (but nml ratio), improved to 102 % post BD. Stress test nml Reviewed records from [facility] >>AHI 37 corrected by CPAP 11 cm, oronasal mask ([facility] resp) - c/o nasal iritation RAST neg- IgE 23  Denies chest pain, orthopnea, hemoptysis, fever, n/v/d, edema, headache. Traveled last month to [place]. No leg swelling or calf pain.     August 27, 2010 --Returns for 2 month follow up. She says she is doing about the same. Has intermittent episodes of  gasping for air a little bit with rest and exertion--but much  better than she had been in past. She admits she is  not using  advair or Veramyst consistently.  We discussed med compliance.  She has agreed to give inhalers a good trial in order to see if this helps her breathing. She did not pick up her face mask for CPAP and is still having trouble with nasal pillows for her CPAP. We discussed weight loss and CPAP compliance as well. She says she will go to pick up mask this week. Denies chest pain,  orthopnea, hemoptysis, fever, n/v/d, edema, headache.  Current Medications (verified): 1)  Singulair 10 Mg Tabs (Montelukast Sodium) .... One Tablet By Mouth Once Daily 2)  Veramyst 27.5 Mcg/spray Susp (Fluticasone Furoate) .Marland Kitchen.. 1 Puff Two Times A Day 3)  Metformin Hcl 500 Mg Tabs (Metformin Hcl) .Marland Kitchen.. 1 Tab By Mouth Two Times A Day 4)  Lipitor 10 Mg Tabs (Atorvastatin Calcium) .... Take 1 Tablet By Mouth Once A Day 5)  Losartan Potassium-Hctz 50-12.5 Mg Tabs (Losartan Potassium-Hctz) .... One Half Tablet By Mouth Once Daily 6)  Sertraline Hcl 50 Mg Tabs (Sertraline Hcl) .... One Tablet By Mouth Once Daily 7)  Zegerid Otc 20-1100 Mg Caps (Omeprazole-Sodium Bicarbonate) .... One Cap By Mouth Daily 8)  Loratadine 10 Mg Tabs (Loratadine) .... Take 2 Tablets By Mouth Once A Day. 9)  Aspir-Low 81 Mg Tbec (Aspirin) .... Take 1 Tablet By Mouth Once A Day. 10)  Centrum Silver  Tabs (Multiple Vitamins-Minerals) .... One Tab By Mouth Once Daily 11)  Proair Hfa 108 (90 Base) Mcg/act Aers (Albuterol Sulfate) .... Take 2 Puffs Every 4 Hours As Needed. 12)  Advair Diskus 250-50 Mcg/dose Aepb (Fluticasone-Salmeterol) .Marland Kitchen.. 1 Puff Two Times A Day  Allergies: 1)  ! Compazine 2)  ! * Tetanus  Past History:  Past Medical History: Last updated: 07/24/2010 HYPERTENSION HYPERLIPIDEMIA DYSPEPSIA OBSTRUCTIVE SLEEP APNEA PLANTAR FASCIITIS  DEPRESSION/ANXIETY  Asthma --02/08/10 FEV1 1.98 (80%), s/p saba 2.22l/m (90%).nml Diabetes 2- diagnosed in 2003 Hx of stomach ulcer as a  teenager  Past Surgical History: Last updated: 06/18/2010 Left knee laproscopy-- 1998 Right Toe surgery-- 1995 Ulcer sugery at age 53 (?partial gastrectomy) deviated septum, rhinoplasty   s/p appendectomy at age 35  Family History: Last updated: 07/24/2010 Mother-- Diabetes Type II, MI approximatedly age 69's.; deceased in her early 15's Father-- Bleeding ulcer, deceased  late seventies 4 kids-- 3 healthy, 1 died at 6 wks (heart defects, perforated bowel)     Social History: Last updated: 06/18/2010 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[place] Never Smoked Alcohol use-no  Regular exercise-no oldest daughter 67 - donna Daughter is 5 and grandchildren live in [place] son 49 - tim From [place]--moved to [place] 2001 Pets -dog  carpet in home (65yrs old) no unusal hobbies   Review of Systems      See HPI  Vital Signs:  Patient profile:   60 year old female Height:      63 inches Weight:      165 pounds BMI:     29.33 O2 Sat:      99 % on Room air Temp:     98.5 degrees F oral Pulse rate:   79 / minute BP sitting:   120 / 70  (right arm) Cuff size:   regular  Vitals Entered By: Kandice Hams CMA (August 27, 2010 8:56 AM)  O2 Flow:  Room air  Physical Exam  Additional Exam:  GEN: A/Ox3; pleasant , NAD HEENT:  [place]/AT, , EACs-clear, TMs-wnl, NOSE pale, clear drainage , THROAT-clear,upper denture plate, few dental caries/fillings noted on bottom  NECK:  Supple w/ fair ROM; no JVD; normal carotid impulses w/o bruits; no thyromegaly or nodules palpated; no lymphadenopathy. RESP  Clear to P & A; w/o, wheezes/ rales/ or rhonchi. CARD:  RRR, no m/r/g   [place]: Warm bil,  no calf tenderness edema, clubbing, pulses intact    Impression & Recommendations:  Problem # 1:  ASTHMA (ICD-493.90) Not optimally  controlled on rx  Encouraged on Advair compliance  sample given   Problem # 2:  OBSTRUCTIVE SLEEP APNEA (ICD-327.23)  change to full  face mask-to pick up this week.  encourgaed on compliance and weight loss   Orders: Est. Patient Level III ([address])  Problem # 3:  ALLERGIC RHINITIS (ICD-477.9)  continue on same meds saline nasal rinses as needed  Her updated medication list for this problem includes:    Veramyst 27.5 Mcg/spray Susp (Fluticasone furoate) .Marland Kitchen... 2 puffs at bedtime    Loratadine 10 Mg Tabs (Loratadine) .Marland Kitchen... Take 2 tablets by mouth once a day.  Orders: Est. Patient Level III ([address])  Medications Added to Medication List This Visit: 1)  Advair Diskus 250-50 Mcg/dose Aepb (Fluticasone-salmeterol) .Marland Kitchen.. 1 puff two times a day 2)  Veramyst 27.5 Mcg/spray Susp (Fluticasone furoate) .... 2 puffs at bedtime  Complete Medication List: 1)  Advair Diskus 250-50  Mcg/dose Aepb (Fluticasone-salmeterol) .Marland Kitchen.. 1 puff two times a day 2)  Singulair 10 Mg Tabs (Montelukast sodium) .... One tablet by mouth once daily 3)  Veramyst 27.5 Mcg/spray Susp (Fluticasone furoate) .... 2 puffs at bedtime 4)  Metformin Hcl 500 Mg Tabs (Metformin hcl) .Marland Kitchen.. 1 tab by mouth two times a day 5)  Lipitor 10 Mg Tabs (Atorvastatin calcium) .... Take 1 tablet by mouth once a day 6)  Losartan Potassium-hctz 50-12.5 Mg Tabs (Losartan potassium-hctz) .... One half tablet by mouth once daily 7)  Sertraline Hcl 50 Mg Tabs (Sertraline hcl) .... One tablet by mouth once daily 8)  Zegerid Otc 20-1100 Mg Caps (Omeprazole-sodium bicarbonate) .... One cap by mouth daily 9)  Loratadine 10 Mg Tabs (Loratadine) .... Take 2 tablets by mouth once a day. 10)  Aspir-low 81 Mg Tbec (Aspirin) .... Take 1 tablet by mouth once a day. 11)  Centrum Silver Tabs (Multiple vitamins-minerals) .... One tab by mouth once daily 12)  Proair Hfa 108 (90 Base) Mcg/act Aers (Albuterol sulfate) .... Take 2 puffs every 4 hours as needed.  Patient Instructions: 1)  Copy sent to: Sandford Craze NP   2)  Please schedule a follow-up appointment in 2 months with Dr. Vassie Loll in  [facility] office   3)   Please try your best to take Advair 250/50 two times a day, brush/rinse and gargle after use.  4)  Take Veramyst 2 puffs at bedtime  5)  Try the full face mask for your CPAP -call if having trouble.

## 2010-09-12 ENCOUNTER — Telehealth: Payer: Self-pay | Admitting: Family

## 2010-09-24 LAB — DIFFERENTIAL
Basophils Absolute: 0 10*3/uL (ref 0.0–0.1)
Eosinophils Absolute: 0.3 10*3/uL (ref 0.0–0.7)
Lymphocytes Relative: 32 % (ref 12–46)
Lymphs Abs: 2.5 10*3/uL (ref 0.7–4.0)
Monocytes Absolute: 0.6 10*3/uL (ref 0.1–1.0)
Monocytes Relative: 8 % (ref 3–12)

## 2010-09-24 LAB — URINALYSIS, ROUTINE W REFLEX MICROSCOPIC
Nitrite: NEGATIVE
Protein, ur: NEGATIVE mg/dL
Specific Gravity, Urine: 1.015 (ref 1.005–1.030)
Urobilinogen, UA: 0.2 mg/dL (ref 0.0–1.0)

## 2010-09-24 LAB — CBC
HCT: 38.1 % (ref 36.0–46.0)
Hemoglobin: 13 g/dL (ref 12.0–15.0)
MCV: 91.7 fL (ref 78.0–100.0)
Platelets: 265 10*3/uL (ref 150–400)
RBC: 4.16 MIL/uL (ref 3.87–5.11)
WBC: 7.8 10*3/uL (ref 4.0–10.5)

## 2010-09-24 LAB — BASIC METABOLIC PANEL
Creatinine, Ser: 0.6 mg/dL (ref 0.4–1.2)
GFR calc Af Amer: >60 mL/min (ref >60)
GFR calc non Af Amer: >60 mL/min (ref >60)
Sodium: 143 mEq/L (ref 135–145)

## 2010-09-24 LAB — GLUCOSE, CAPILLARY: Glucose-Capillary: 112 mg/dL — ABNORMAL HIGH (ref 70–99)

## 2010-09-24 LAB — URINE MICROSCOPIC-ADD ON

## 2010-09-24 NOTE — Progress Notes (Signed)
Summary: med for herpes  Phone Note Call from Patient Call back at Home Phone [phone]   Caller: Patient Call For: Lemont Fillers FNP Summary of Call: pt stated that herpes is acting up and wanted Christo Hain to call her in something. [facility] [address] [place]. please assist. Initial call taken by: Elba Barman,  September 12, 2010 12:04 PM  Follow-up for Phone Call        Rx was called into her pharmacy.  Pls notify pt. Follow-up by: Lemont Fillers FNP,  September 12, 2010 8:50 PM  Additional Follow-up for Phone Call Additional follow up Details #1::        Left message on machine to return my call. Nicki Guadalajara Fergerson CMA Duncan Dull)  September 13, 2010 8:49 AM   patient returned phone call and left voice message stating she could be reached between 12 and 12:30 that is when she goes to lunch, and asked to try her again  Additional Follow-up by: Glendell Docker CMA,  September 13, 2010 10:24 AM    Additional Follow-up for Phone Call Additional follow up Details #2::    Attempted to reach pt at above number. Left message on voicemail that refill was completed last week and to call if any questions. Nicki Guadalajara Fergerson CMA (AAMA)  September 16, 2010 12:26 PM   New/Updated Medications: VALTREX 500 MG TABS (VALACYCLOVIR HCL) one tablet by mouth two times a day for 3 days Prescriptions: VALTREX 500 MG TABS (VALACYCLOVIR HCL) one tablet by mouth two times a day for 3 days  #6 x 0   Entered and Authorized by:   Lemont Fillers FNP   Signed by:   Lemont Fillers FNP on 09/12/2010   Method used:   Telephoned to ...       [facility] [phone] (retail)       [address]       [place], [place]  [address]  [place]       Ph: [phone]       Fax:    RxID:   [id number]

## 2010-10-07 ENCOUNTER — Telehealth: Payer: Self-pay | Admitting: *Deleted

## 2010-10-07 NOTE — Telephone Encounter (Signed)
Please instruct patient to hold hyzaar, drink small frequent amounts of water.  She may use OTC immodium, follow up in the office tomorrow.  Go to ED if symptoms worsen or if she is unable to hold down water.

## 2010-10-07 NOTE — Telephone Encounter (Signed)
Left message on machine to return my call. 

## 2010-10-07 NOTE — Telephone Encounter (Signed)
Received call from pt stating she has had episodes of watery stools 2-3 x a day the last 2 days. Also has nausea today. Pt states her blood sugars have been low (90's to low 100 range) and is concerned.  Advised pt of need to be seen in the office and pt requested appt for Wednesday. Appt. Made for 8:45am. Please advise.

## 2010-10-08 ENCOUNTER — Encounter: Payer: Self-pay | Admitting: Family

## 2010-10-08 NOTE — Telephone Encounter (Signed)
Advised pt per Melissa's instruction. Pt states she feels some better today but will still keep appt for tomorrow.

## 2010-10-08 NOTE — Telephone Encounter (Signed)
Pt returned phone call. Best phone# to reach her at is [phone].

## 2010-10-09 ENCOUNTER — Encounter: Payer: Self-pay | Admitting: Family

## 2010-10-09 ENCOUNTER — Encounter: Payer: Self-pay | Admitting: *Deleted

## 2010-10-09 ENCOUNTER — Ambulatory Visit (INDEPENDENT_AMBULATORY_CARE_PROVIDER_SITE_OTHER): Payer: BC Managed Care – PPO | Admitting: Family

## 2010-10-09 DIAGNOSIS — K5289 Other specified noninfective gastroenteritis and colitis: Secondary | ICD-10-CM

## 2010-10-09 DIAGNOSIS — E119 Type 2 diabetes mellitus without complications: Secondary | ICD-10-CM

## 2010-10-09 DIAGNOSIS — K529 Noninfective gastroenteritis and colitis, unspecified: Secondary | ICD-10-CM | POA: Insufficient documentation

## 2010-10-09 NOTE — Progress Notes (Signed)
Subjective:    Patient ID: Crystal [name], female    DOB: 1950-12-21, 60 y.o.   MRN: [id number]  HPI DM- notes that her sugars 139-147 fasting.  Last A1C was 6.7.   Urinates once at night.  GI-  Diarrhea started about 6 days ago.  Notes that as of Monday (3 days ago) felt very sick. + nausea, no vomitting.  Yesterday, had 4 BMS- 2 were "water", last night "soft" today bm was "solid."  Denies black or bloody stools.     Review of Systems  Constitutional: Negative for fever.  Gastrointestinal: Positive for nausea and diarrhea. Negative for abdominal pain and blood in stool.   Past Medical History  Diagnosis Date  . Hypertension   . Hyperlipemia   . Dyspepsia   . OSA (obstructive sleep apnea)   . Plantar fasciitis   . Depression   . Asthma     02/08/10 FEV1 1.98 (80%), s/p saba 2.22 l/m (90%).nml  . Anxiety   . 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 Acute peptic ulcer of stomach     As a teenager    History   Social History  . Marital Status: Married    Spouse Name: [name]    Number of Children: 4  . 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 Smoking status: Never Smoker   . Smokeless tobacco: Not on file  . Alcohol Use: No  . Drug Use:   . Sexually Active:    Other Topics Concern  . Not on file   Social History Narrative  . No narrative on file    Past Surgical History  Procedure Date  . Knee arthroscopy 1998    left knee  . Toe surgery 1995    right  . Nasal septum surgery   . Rhinoplasty   . Appendectomy     age 2    Family History  Problem Relation Age of Onset  . Diabetes Mother     type II  . Heart disease Mother 53    Allergies  Allergen Reactions  . Prochlorperazine Edisylate     REACTION: coma for 3 days  . Tetanus Toxoid     REACTION: unknown    Current Outpatient Prescriptions on File Prior to Visit  Medication Sig Dispense Refill  . albuterol (PROAIR HFA) 108 (90 BASE)  MCG/ACT inhaler Inhale 2 puffs into the lungs every 4 (four) hours as needed.        Marland Kitchen atorvastatin (LIPITOR) 10 MG tablet Take 10 mg by mouth daily.        . fluticasone (VERAMYST) 27.5 MCG/SPRAY nasal spray 1 spray by Nasal route 2 (two) times daily.        . Fluticasone-Salmeterol (ADVAIR DISKUS) 250-50 MCG/DOSE AEPB Inhale 1 puff into the lungs 2 (two) times daily.        Marland Kitchen loratadine (CLARITIN) 10 MG tablet Take 2 tablets by mouth daily.       . metFORMIN (GLUCOPHAGE) 500 MG tablet Take 500 mg by mouth 2 (two) times daily with meals.        . montelukast (SINGULAIR) 10 MG tablet Take 10 mg by mouth daily.        . Multiple Vitamins-Minerals (CENTRUM SILVER PO) Take 1 tablet by mouth daily.        Maxwell Caul Bicarbonate (ZEGERID OTC) 20-1100 MG CAPS Take 1 capsule by mouth 2 (  two) times daily.       . sertraline (ZOLOFT) 50 MG tablet Take 50 mg by mouth daily.        Marland Kitchen aspirin 81 MG tablet Take 81 mg by mouth daily.        . chlorpheniramine-hydrocodone (TUSSIONEX PENNKINETIC ER) 10-8 MG/5ML LQCR Take 5 mLs by mouth every 12 (twelve) hours as needed.        Marland Kitchen losartan-hydrochlorothiazide (HYZAAR) 50-12.5 MG per tablet Take 1/2 tablet by mouth daily.       Marland Kitchen DISCONTD: nystatin (MYCOSTATIN) 100000 UNIT/ML suspension Take 500,000 Units by mouth 4 (four) times daily.          BP 128/80  Pulse 72  Temp(Src) 97.8 F (36.6 C) (Oral)  Resp 18  Ht 5' 3.5" (1.613 m)  Wt 160 lb (72.576 kg)  BMI 27.90 kg/m2      Objective:   Physical Exam  Constitutional: She appears well-nourished.  Cardiovascular: Normal rate and regular rhythm.   Pulmonary/Chest: Effort normal and breath sounds normal.  Abdominal: Soft. Normal appearance and bowel sounds are normal. There is no tenderness.          Assessment & Plan:

## 2010-10-09 NOTE — Patient Instructions (Signed)
Please follow up in 1 month. Call if you develop recurrent diarrhea or nausea.

## 2010-10-09 NOTE — Assessment & Plan Note (Signed)
Symptoms appear to be resolving.  HR and BP stable.  Monitor.

## 2010-10-09 NOTE — Assessment & Plan Note (Signed)
Patient's last A1C was 6.7.  Continue current meds.  Will check A1C in 1 month.

## 2010-10-10 ENCOUNTER — Encounter: Payer: Self-pay | Admitting: Family

## 2010-10-14 ENCOUNTER — Telehealth: Payer: Self-pay | Admitting: Family

## 2010-10-14 MED ORDER — MONTELUKAST SODIUM 10 MG PO TABS: 10.0000 mg | ORAL_TABLET | Freq: Every day | ORAL | Status: DC

## 2010-10-14 NOTE — Telephone Encounter (Signed)
Refill-singulair 10mg  tab merc. Take one tablet by mouth every day. Qty 30. Last fill 3.5.12

## 2010-10-15 ENCOUNTER — Encounter: Payer: Self-pay | Admitting: Family

## 2010-10-27 ENCOUNTER — Emergency Department (HOSPITAL_BASED_OUTPATIENT_CLINIC_OR_DEPARTMENT_OTHER)
Admission: EM | Admit: 2010-10-27 | Discharge: 2010-10-27 | Disposition: A | Discharge status: Home / Self Care | Payer: BC Managed Care – PPO | Attending: Emergency Medicine | Admitting: Emergency Medicine

## 2010-10-27 DIAGNOSIS — E119 Type 2 diabetes mellitus without complications: Secondary | ICD-10-CM | POA: Insufficient documentation

## 2010-10-27 DIAGNOSIS — Z79899 Other long term (current) drug therapy: Secondary | ICD-10-CM | POA: Insufficient documentation

## 2010-10-27 DIAGNOSIS — T63461A Toxic effect of venom of wasps, accidental (unintentional), initial encounter: Secondary | ICD-10-CM | POA: Insufficient documentation

## 2010-10-27 DIAGNOSIS — I10 Essential (primary) hypertension: Secondary | ICD-10-CM | POA: Insufficient documentation

## 2010-10-27 DIAGNOSIS — T6391XA Toxic effect of contact with unspecified venomous animal, accidental (unintentional), initial encounter: Secondary | ICD-10-CM | POA: Insufficient documentation

## 2010-10-27 DIAGNOSIS — J45909 Unspecified asthma, uncomplicated: Secondary | ICD-10-CM | POA: Insufficient documentation

## 2010-10-27 DIAGNOSIS — E7800 Pure hypercholesterolemia, unspecified: Secondary | ICD-10-CM | POA: Insufficient documentation

## 2010-10-31 ENCOUNTER — Telehealth: Payer: Self-pay | Admitting: Pulmonary Disease

## 2010-10-31 MED ORDER — FLUTICASONE-SALMETEROL 250-50 MCG/DOSE IN AEPB: 1.0000 | INHALATION_SPRAY | Freq: Two times a day (BID) | RESPIRATORY_TRACT | Status: DC

## 2010-10-31 NOTE — Telephone Encounter (Signed)
Sent rx to pharmacy. Left detailed message of named vm advising her rx was sent

## 2010-11-08 ENCOUNTER — Ambulatory Visit (INDEPENDENT_AMBULATORY_CARE_PROVIDER_SITE_OTHER): Payer: BC Managed Care – PPO | Admitting: Family

## 2010-11-08 ENCOUNTER — Encounter: Payer: Self-pay | Admitting: Family

## 2010-11-08 VITALS — BP 118/80 | HR 72 | Temp 97.8°F | Resp 16 | Wt 162.1 lb

## 2010-11-08 DIAGNOSIS — E119 Type 2 diabetes mellitus without complications: Secondary | ICD-10-CM

## 2010-11-08 DIAGNOSIS — J45909 Unspecified asthma, uncomplicated: Secondary | ICD-10-CM

## 2010-11-08 LAB — HEMOGLOBIN A1C
Hgb A1c MFr Bld: 6.7 % — ABNORMAL HIGH (ref <5.7)
Mean Plasma Glucose: 146 mg/dL — ABNORMAL HIGH (ref <117)

## 2010-11-08 NOTE — Patient Instructions (Signed)
Please complete your blood work on the first floor.  Follow up in 3 months.  

## 2010-11-08 NOTE — Progress Notes (Signed)
Subjective:    Patient ID: Crystal [name], female    DOB: [birth year], 60 y.o.   MRN: [id number]  HPI DM- sugar AM 135-150, less than 130 - "doesn't feel right."  Still eating a lot of fried foods, walks 30 minutes every morning.  She is fasting.   Asthma-  Denies asthma symptoms.   Review of Systems See HPI  Past Medical History  Diagnosis Date  . Hypertension   . Hyperlipemia   . Dyspepsia   . OSA (obstructive sleep apnea)   . Plantar fasciitis   . Depression   . Asthma     02/08/10 FEV1 1.98 (80%), s/p saba 2.22 l/m (90%).nml  . Anxiety   . 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 Acute peptic ulcer of stomach     As a teenager    History   Social History  . Marital Status: Married    Spouse Name: [name]    Number of Children: 4  . 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 Smoking status: Never Smoker   . Smokeless tobacco: Not on file  . Alcohol Use: No  . Drug Use:   . Sexually Active:    Other Topics Concern  . Not on file   Social History Narrative  . No narrative on file    Past Surgical History  Procedure Date  . Knee arthroscopy 1998    left knee  . Toe surgery 1995    right  . Nasal septum surgery   . Rhinoplasty   . Appendectomy     age 64    Family History  Problem Relation Age of Onset  . Diabetes Mother     type II  . Heart disease Mother 95    Allergies  Allergen Reactions  . Prochlorperazine Edisylate     REACTION: coma for 3 days  . Tetanus Toxoid     REACTION: unknown    Current Outpatient Prescriptions on File Prior to Visit  Medication Sig Dispense Refill  . albuterol (PROAIR HFA) 108 (90 BASE) MCG/ACT inhaler Inhale 2 puffs into the lungs every 4 (four) hours as needed.        Marland Kitchen aspirin 81 MG tablet Take 81 mg by mouth daily.        Marland Kitchen atorvastatin (LIPITOR) 10 MG tablet Take 10 mg by mouth daily.        . chlorpheniramine-hydrocodone (TUSSIONEX PENNKINETIC  ER) 10-8 MG/5ML LQCR Take 5 mLs by mouth every 12 (twelve) hours as needed.        . fluticasone (VERAMYST) 27.5 MCG/SPRAY nasal spray 1 spray by Nasal route 2 (two) times daily.        . Fluticasone-Salmeterol (ADVAIR DISKUS) 250-50 MCG/DOSE AEPB Inhale 1 puff into the lungs 2 (two) times daily.  60 each  3  . loratadine (CLARITIN) 10 MG tablet Take 2 tablets by mouth daily.       . metFORMIN (GLUCOPHAGE) 500 MG tablet Take 500 mg by mouth 2 (two) times daily with meals.        . montelukast (SINGULAIR) 10 MG tablet Take 1 tablet (10 mg total) by mouth daily.  30 tablet  2  . Multiple Vitamins-Minerals (CENTRUM SILVER PO) Take 1 tablet by mouth daily.        Maxwell Caul Bicarbonate (ZEGERID OTC) 20-1100 MG CAPS Take 1 capsule by mouth 2 (two) times daily.       Marland Kitchen  sertraline (ZOLOFT) 50 MG tablet Take 50 mg by mouth daily.        Marland Kitchen losartan-hydrochlorothiazide (HYZAAR) 50-12.5 MG per tablet Take 1/2 tablet by mouth daily.         BP 118/80  Pulse 72  Temp(Src) 97.8 F (36.6 C) (Oral)  Resp 16  Wt 162 lb 1.9 oz (73.537 kg)       Objective:   Physical Exam  Constitutional: She appears well-developed and well-nourished.  Cardiovascular: Normal rate and regular rhythm.   Pulmonary/Chest: Effort normal and breath sounds normal.  Psychiatric: She has a normal mood and affect. Her behavior is normal.   .       Assessment & Plan:

## 2010-11-10 ENCOUNTER — Encounter: Payer: Self-pay | Admitting: Family

## 2010-11-12 NOTE — Assessment & Plan Note (Signed)
Stable, continue advair, albuterol and singulair.

## 2010-11-12 NOTE — Assessment & Plan Note (Addendum)
Check A1C. Diabetic foot exam completed today. Continue ACE, aspirin, metformin.

## 2010-12-16 ENCOUNTER — Telehealth: Payer: Self-pay | Admitting: *Deleted

## 2010-12-16 MED ORDER — ATORVASTATIN CALCIUM 10 MG PO TABS: 10.0000 mg | ORAL_TABLET | Freq: Every day | ORAL | Status: DC

## 2010-12-16 MED ORDER — LOSARTAN POTASSIUM-HCTZ 50-12.5 MG PO TABS: 1.0000 | ORAL_TABLET | Freq: Every day | ORAL | Status: DC

## 2010-12-16 MED ORDER — MONTELUKAST SODIUM 10 MG PO TABS: 10.0000 mg | ORAL_TABLET | Freq: Every day | ORAL | Status: DC

## 2010-12-16 MED ORDER — SERTRALINE HCL 50 MG PO TABS: 50.0000 mg | ORAL_TABLET | Freq: Every day | ORAL | Status: DC

## 2010-12-16 NOTE — Telephone Encounter (Signed)
Pt called stating she needed 90 days supplies on: Singulair, Sertraline, Atorvastatin and Losartan-HCT sent to [facility] on [address] for insurance purposes. Pt has f/u 01/2011 and requested Thursdays since she takes her husband to chemotherapy on those days. Refills sent to pharmacy.

## 2010-12-17 ENCOUNTER — Other Ambulatory Visit: Payer: Self-pay | Admitting: Family

## 2010-12-17 ENCOUNTER — Encounter: Payer: Self-pay | Admitting: Family

## 2010-12-17 ENCOUNTER — Ambulatory Visit (HOSPITAL_BASED_OUTPATIENT_CLINIC_OR_DEPARTMENT_OTHER)
Admission: RE | Admit: 2010-12-17 | Discharge: 2010-12-17 | Disposition: A | Discharge status: Home / Self Care | Payer: BC Managed Care – PPO | Source: Ambulatory Visit | Attending: Family | Admitting: Family

## 2010-12-17 ENCOUNTER — Ambulatory Visit (INDEPENDENT_AMBULATORY_CARE_PROVIDER_SITE_OTHER): Payer: BC Managed Care – PPO | Admitting: Family

## 2010-12-17 DIAGNOSIS — K5730 Diverticulosis of large intestine without perforation or abscess without bleeding: Secondary | ICD-10-CM

## 2010-12-17 DIAGNOSIS — R109 Unspecified abdominal pain: Secondary | ICD-10-CM

## 2010-12-17 DIAGNOSIS — R197 Diarrhea, unspecified: Secondary | ICD-10-CM

## 2010-12-17 LAB — CBC
MCH: 30.6 pg (ref 26.0–34.0)
MCHC: 34.3 g/dL (ref 30.0–36.0)
MCV: 89.3 fL (ref 78.0–100.0)
Platelets: 260 10*3/uL (ref 150–400)
RDW: 12.8 % (ref 11.5–15.5)
WBC: 8.2 10*3/uL (ref 4.0–10.5)

## 2010-12-17 LAB — HEPATIC FUNCTION PANEL
Albumin: 4.5 g/dL (ref 3.5–5.2)
Total Protein: 7.6 g/dL (ref 6.0–8.3)

## 2010-12-17 MED ORDER — METRONIDAZOLE 500 MG PO TABS: 500.0000 mg | ORAL_TABLET | Freq: Three times a day (TID) | ORAL | Status: AC

## 2010-12-17 MED ORDER — CIPROFLOXACIN HCL 500 MG PO TABS: 500.0000 mg | ORAL_TABLET | Freq: Two times a day (BID) | ORAL | Status: DC

## 2010-12-17 NOTE — Progress Notes (Signed)
Subjective:    Patient ID: Crystal [name], female    DOB: 1950-11-06, 60 y.o.   MRN: [id number]  HPI  [name] is a 60 year old female who presents today with chief complaint of diarrhea. She reports that stools are yellow and liquid in color.  Reports 3-5 BM's a day. She has associated left lower abdominal pain.  Denies associated fever.  + associated nausea without vomitting.  She feels like she is keeping up with her fluid needs.  Tolerating PO's.  She denies recent travel or antibiotics.  Denies history of diverticulitis, melena, of hematochezia.    Review of Systems  See HPI  Past Medical History  Diagnosis Date  . Hypertension   . Hyperlipemia   . Dyspepsia   . OSA (obstructive sleep apnea)   . Plantar fasciitis   . Depression   . Asthma     02/08/10 FEV1 1.98 (80%), s/p saba 2.22 l/m (90%).nml  . Anxiety   . 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 Acute peptic ulcer of stomach     As a teenager    History   Social History  . Marital Status: Married    Spouse Name: [name]    Number of Children: 4  . 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 Smoking status: Never Smoker   . Smokeless tobacco: Not on file  . Alcohol Use: No  . Drug Use:   . Sexually Active:    Other Topics Concern  . Not on file   Social History Narrative  . No narrative on file    Past Surgical History  Procedure Date  . Knee arthroscopy 1998    left knee  . Toe surgery 1995    right  . Nasal septum surgery   . Rhinoplasty   . Appendectomy     age 60    Family History  Problem Relation Age of Onset  . Diabetes Mother     type II  . Heart disease Mother 20    Allergies  Allergen Reactions  . Prochlorperazine Edisylate     REACTION: coma for 3 days  . Tetanus Toxoid     REACTION: unknown    Current Outpatient Prescriptions on File Prior to Visit  Medication Sig Dispense Refill  . albuterol (PROAIR HFA) 108  (90 BASE) MCG/ACT inhaler Inhale 2 puffs into the lungs every 4 (four) hours as needed.        Marland Kitchen atorvastatin (LIPITOR) 10 MG tablet Take 1 tablet (10 mg total) by mouth daily.  90 tablet  0  . fluticasone (VERAMYST) 27.5 MCG/SPRAY nasal spray 1 spray by Nasal route 2 (two) times daily.        . Fluticasone-Salmeterol (ADVAIR DISKUS) 250-50 MCG/DOSE AEPB Inhale 1 puff into the lungs 2 (two) times daily.  60 each  3  . loratadine (CLARITIN) 10 MG tablet Take 2 tablets by mouth daily.       Marland Kitchen losartan-hydrochlorothiazide (HYZAAR) 50-12.5 MG per tablet Take 1 tablet by mouth daily.  90 tablet  0  . metFORMIN (GLUCOPHAGE) 500 MG tablet Take 500 mg by mouth 2 (two) times daily with meals.        . montelukast (SINGULAIR) 10 MG tablet Take 1 tablet (10 mg total) by mouth daily.  90 tablet  0  . Multiple Vitamins-Minerals (CENTRUM SILVER PO) Take 1 tablet by mouth daily.        Marland Kitchen  Omeprazole-Sodium Bicarbonate (ZEGERID OTC) 20-1100 MG CAPS Take 1 capsule by mouth 2 (two) times daily.       . sertraline (ZOLOFT) 50 MG tablet Take 1 tablet (50 mg total) by mouth daily.  90 tablet  0  . DISCONTD: aspirin 81 MG tablet Take 81 mg by mouth daily.        Marland Kitchen DISCONTD: chlorpheniramine-hydrocodone (TUSSIONEX PENNKINETIC ER) 10-8 MG/5ML LQCR Take 5 mLs by mouth every 12 (twelve) hours as needed.          BP 118/86  Pulse 90  Temp(Src) 97.6 F (36.4 C) (Oral)  Resp 16  Ht 5' 3.5" (1.613 m)  Wt 162 lb 1.9 oz (73.537 kg)  BMI 28.27 kg/m2        Objective:   Physical Exam General: Awake, alert, uncomfortable appearing white female Cardiovascular: S1, S2, regular rate and rhythm Respiratory: Breath sounds are clear to auscultation bilaterally without wheezes rales or rhonchi Abdomen: Positive bowel sounds are noted, new epigastric right upper quadrant or left upper quadrant pain is noted to palpation. Abdomen is soft. She is noted to be tender in her right lower corner and and left lower quadrant with mild  guarding. She is notably more tender in the left lower quadrant than the right lower quadrant. Psychiatric: Awake, alert, oriented x3, calm and pleasant       Assessment & Plan:

## 2010-12-17 NOTE — Patient Instructions (Signed)
Complete your blood work and CT on the first floor tonight.  Go to ER if you develop worsening abdominal pain.

## 2010-12-18 ENCOUNTER — Telehealth: Payer: Self-pay | Admitting: Family

## 2010-12-18 DIAGNOSIS — R197 Diarrhea, unspecified: Secondary | ICD-10-CM

## 2010-12-18 LAB — BASIC METABOLIC PANEL WITH GFR
Chloride: 101 mEq/L (ref 96–112)
GFR, Est Non African American: >60 mL/min (ref >60)
Potassium: 3.7 mEq/L (ref 3.5–5.3)
Sodium: 137 mEq/L (ref 135–145)

## 2010-12-18 NOTE — Telephone Encounter (Signed)
Spoke with patient last night

## 2010-12-19 ENCOUNTER — Other Ambulatory Visit: Payer: Self-pay | Admitting: Family

## 2010-12-19 ENCOUNTER — Encounter: Payer: Self-pay | Admitting: Family

## 2010-12-19 ENCOUNTER — Telehealth: Payer: Self-pay | Admitting: Internal Medicine

## 2010-12-19 DIAGNOSIS — R109 Unspecified abdominal pain: Secondary | ICD-10-CM | POA: Insufficient documentation

## 2010-12-19 DIAGNOSIS — K760 Fatty (change of) liver, not elsewhere classified: Secondary | ICD-10-CM

## 2010-12-19 HISTORY — DX: Fatty (change of) liver, not elsewhere classified: K76.0

## 2010-12-19 NOTE — Assessment & Plan Note (Signed)
Pt with diarrhea x 6 weeks and lower abdominal pain.  CT abdomen/pelvis did not show any acute abdominal pathology.  WBC is normal, not anemic, BMET/LFT's unremarkable.  Will plan to treat empirically with metronidazole and will have patient complete stool culture/stool for c.diff and will refer to GI for further evaluation.

## 2010-12-19 NOTE — Telephone Encounter (Signed)
Spoke with [facility]. Pt with New onset of abdominal pain with yellow watery stools. No appt available with Dr Leone Payor until mid July so pt will see Amy Esterwood on 12/23/10 at 1:30pm. They will contact the pt.

## 2010-12-20 ENCOUNTER — Telehealth: Payer: Self-pay | Admitting: *Deleted

## 2010-12-20 LAB — CLOSTRIDIUM DIFFICILE EIA: CDIFTX: NEGATIVE

## 2010-12-20 NOTE — Telephone Encounter (Signed)
Received message from Delice Bison at [facility] requesting clarification of stool culture that was ordered. They want to know if there is only one culture. Returned her call and left message on her voicemail that order is for a stool culture and C.diff x 1. Please make sure C.Diff is ordered as well and to call if any questions.

## 2010-12-23 ENCOUNTER — Ambulatory Visit (INDEPENDENT_AMBULATORY_CARE_PROVIDER_SITE_OTHER): Payer: BC Managed Care – PPO | Admitting: Physician Assistant

## 2010-12-23 ENCOUNTER — Encounter: Payer: Self-pay | Admitting: Physician Assistant

## 2010-12-23 VITALS — BP 132/80 | HR 74 | Temp 97.8°F | Ht 63.0 in | Wt 168.0 lb

## 2010-12-23 DIAGNOSIS — R197 Diarrhea, unspecified: Secondary | ICD-10-CM

## 2010-12-23 DIAGNOSIS — R110 Nausea: Secondary | ICD-10-CM

## 2010-12-23 LAB — STOOL CULTURE

## 2010-12-23 MED ORDER — ONDANSETRON HCL 4 MG PO TABS: 4.0000 mg | ORAL_TABLET | Freq: Every day | ORAL | Status: DC | PRN

## 2010-12-23 MED ORDER — ALIGN 4 MG PO CAPS: 4.0000 | ORAL_CAPSULE | Freq: Every day | ORAL | Status: DC

## 2010-12-23 MED ORDER — DICYCLOMINE HCL 10 MG PO CAPS: ORAL_CAPSULE | ORAL | Status: DC

## 2010-12-23 NOTE — Patient Instructions (Signed)
We will call you when we get the stool culture results back. We have given you samples of a probiotic, Align, take 1 capsule daily for 4 weeks.  Continue the Flagyl 250 mg 4 times daily.  We sent a prescription for Zofran 4 mg as directed. We sent it to [facility]. Follow a low residue diet until the diarrhea has decreased and your doing betterl.

## 2010-12-24 ENCOUNTER — Encounter: Payer: Self-pay | Admitting: Physician Assistant

## 2010-12-24 NOTE — Progress Notes (Signed)
  Subjective:    Patient ID: Crystal [name], female    DOB: 1950-11-29, 60 y.o.   MRN: [id number]  HPI    Review of Systems  Constitutional: Positive for fatigue.  HENT: Negative.   Eyes: Negative.   Respiratory: Negative.   Cardiovascular: Negative.   Gastrointestinal: Positive for nausea, abdominal pain, diarrhea and anal bleeding.  Genitourinary: Negative.   Musculoskeletal: Negative.   Neurological: Negative.   Hematological: Negative.   Psychiatric/Behavioral: Negative.        Objective:   Physical Exam        Assessment & Plan:

## 2010-12-24 NOTE — Progress Notes (Signed)
Subjective:    Patient ID: Crystal [name], female    DOB: [birth year], 60 y.o.   MRN: [id number]  HPI Crystal [name] is a pleasant 60 year old white female known to Dr. Leone [name] from colonoscopy which was done for screening November 2011. This showed moderate diverticulosis in the sigmoid colon internal and external hemorrhoids and otherwise negative exam. She is referred today by Crystal [name]. Crystal Caprice NP for evaluation of persistent diarrhea x6 weeks. Patient did take a course or 2 of antibiotics in December for a cellulitis but has not had any antibiotics since and has not been on any new medications. She relates having 3-5 bowel movements per day sometimes starting on a soft stool and then becoming more watery as the day goes on. She says his stool is yellow in color. She has seen small amounts of blood on the tissue but not mixed in with her bowel movements. Today she has had 6 bowel movements most of them after lunch but says that she ate a chili dog and that that was probably a mistake. She has been having nausea as well and ongoing problems with reflux. Appetite has been okay she has not sustained any weight loss no fever or chills. She does complain of some cramping bilaterally in her lower quadrants.  CT the abdomen and pelvis was done on 12/17/2010 which was unremarkable with the exception of diverticulosis. CBC was normal and stool cultures and stool for C. difficile are currently pending. She was started on Flagyl 250, 3  times a day last week but says this did make her nauseated and she's not been taking it.    Review of Systems  Constitutional: Positive for fatigue.  HENT: Negative.   Eyes: Negative.   Cardiovascular: Negative.   Gastrointestinal: Positive for nausea, abdominal pain, diarrhea and anal bleeding.  Genitourinary: Negative.   Musculoskeletal: Negative.   Skin: Negative.   Neurological: Negative.   Psychiatric/Behavioral: Negative.    Outpatient Prescriptions Prior to Visit    Medication Sig Dispense Refill  . albuterol (PROAIR HFA) 108 (90 BASE) MCG/ACT inhaler Inhale 2 puffs into the lungs every 4 (four) hours as needed.        Marland Kitchen atorvastatin (LIPITOR) 10 MG tablet Take 1 tablet (10 mg total) by mouth daily.  90 tablet  0  . fluticasone (VERAMYST) 27.5 MCG/SPRAY nasal spray 1 spray by Nasal route 2 (two) times daily.        . Fluticasone-Salmeterol (ADVAIR DISKUS) 250-50 MCG/DOSE AEPB Inhale 1 puff into the lungs 2 (two) times daily.  60 each  3  . loratadine (CLARITIN) 10 MG tablet Take 2 tablets by mouth daily.       Marland Kitchen losartan-hydrochlorothiazide (HYZAAR) 50-12.5 MG per tablet Take 1 tablet by mouth daily.  90 tablet  0  . metFORMIN (GLUCOPHAGE) 500 MG tablet Take 500 mg by mouth 2 (two) times daily with meals.        . metroNIDAZOLE (FLAGYL) 500 MG tablet Take 1 tablet (500 mg total) by mouth 3 (three) times daily.  30 tablet  0  . montelukast (SINGULAIR) 10 MG tablet Take 1 tablet (10 mg total) by mouth daily.  90 tablet  0  . Multiple Vitamins-Minerals (CENTRUM SILVER PO) Take 1 tablet by mouth daily.        Crystal [name] Bicarbonate (ZEGERID OTC) 20-1100 MG CAPS Take 1 capsule by mouth 2 (two) times daily.       . sertraline (ZOLOFT) 50 MG tablet Take 1 tablet (50 mg total)  by mouth daily.  90 tablet  0       Objective:   Physical Exam Well-developed white female in no acute distress, pleasant, alert and oriented x3 HEENT; nontraumatic normocephalic EOMI PERRLA sclera anicteric tongue moist  Neck supple no JVD  Cardiovascular; regular rate and rhythm with S1-S2 no murmur or bruit gallop  Pulmonary; clear bilaterally Abdomen; soft, bowel sounds active, mildly tender right and left lower quadrants no guarding no rebound no palpable mass or hepatosplenomegaly  Rectal; not done Skin benign warm and dry no lesions  Psych; mood and affect normal and appropriate        Assessment & Plan:  #63 60 year old female diabetic with six-week history of diarrhea  and nausea. Patient with negative colonoscopy November 2011. Etiology of current symptoms is not clear suspect an infectious etiology and possibly even C. Diff colitis.  Plan Await stool studies which are pending at this time Restart Flagyl at 250 mg 4 times daily x2 weeks Start Zofran 4 mg every 6 hours as needed for nausea Start Bentyl 10 mg 3 times daily as needed for cramping Soft low-residue diet Further plans pending the results of her stool studies.  #2 Diverticulosis  #3 Positive family history of colon cancer Followup colonoscopy November 2011  #4 Adult onset diabetes mellitus  #5 Sleep apnea #6 Remote history of peptic ulcer disease.

## 2010-12-24 NOTE — Progress Notes (Signed)
Agree with Ms. Esterwood's assessment and plan. Shyana Kulakowski E. Aryam Zhan, MD, FACG   

## 2010-12-27 ENCOUNTER — Telehealth: Payer: Self-pay | Admitting: Physician Assistant

## 2010-12-30 ENCOUNTER — Telehealth: Payer: Self-pay | Admitting: *Deleted

## 2010-12-30 ENCOUNTER — Telehealth: Payer: Self-pay | Admitting: Family

## 2010-12-30 NOTE — Telephone Encounter (Signed)
Pt states that she is on a low fiber diet and is feeling much better. Patient states that all her tests can back negative.

## 2010-12-30 NOTE — Telephone Encounter (Signed)
Called pt's home # [phone] and had to leave her a message . I did advise the Stool culture and CDiff culture results were negative.  I asked her to please call me with a progress report, we want to know how she is doing since she saw Amy Esterwood PA-C.

## 2010-12-30 NOTE — Telephone Encounter (Signed)
The patient was returning my call and was glad to hear the Stool culture and the CDiff PCR test were negative.  I asked the patient how she was doing.  She said Amy had given her a low fiber diet and she is doing better.  The only time she had diarrhea, twice, since she saw Amy was when she  ate a few things she should not have.  Her stomach isn't hurting at all now.

## 2010-12-31 NOTE — Telephone Encounter (Signed)
Call pt to advise, per Willette Cluster ACNP, the CT scan results were negative.   I Lm for the patient on her home ans machine.  I did advise for her that if she has any recurrent diarrhea or stomach pains to call us.

## 2010-12-31 NOTE — Telephone Encounter (Signed)
Late entry- patient was contacted on 6/5 to follow up on her symptoms. Slightly improved.  Still having diarrhea- recommended stool studies be completed as below.

## 2011-01-25 ENCOUNTER — Emergency Department (HOSPITAL_BASED_OUTPATIENT_CLINIC_OR_DEPARTMENT_OTHER)
Admission: EM | Admit: 2011-01-25 | Discharge: 2011-01-25 | Disposition: A | Discharge status: Home / Self Care | Payer: BC Managed Care – PPO | Attending: Emergency Medicine | Admitting: Emergency Medicine

## 2011-01-25 ENCOUNTER — Encounter (HOSPITAL_BASED_OUTPATIENT_CLINIC_OR_DEPARTMENT_OTHER): Payer: Self-pay | Admitting: *Deleted

## 2011-01-25 DIAGNOSIS — T6391XA Toxic effect of contact with unspecified venomous animal, accidental (unintentional), initial encounter: Secondary | ICD-10-CM | POA: Insufficient documentation

## 2011-01-25 DIAGNOSIS — Z79899 Other long term (current) drug therapy: Secondary | ICD-10-CM | POA: Insufficient documentation

## 2011-01-25 DIAGNOSIS — T63441A Toxic effect of venom of bees, accidental (unintentional), initial encounter: Secondary | ICD-10-CM

## 2011-01-25 DIAGNOSIS — J45909 Unspecified asthma, uncomplicated: Secondary | ICD-10-CM | POA: Insufficient documentation

## 2011-01-25 DIAGNOSIS — E7800 Pure hypercholesterolemia, unspecified: Secondary | ICD-10-CM | POA: Insufficient documentation

## 2011-01-25 DIAGNOSIS — I10 Essential (primary) hypertension: Secondary | ICD-10-CM | POA: Insufficient documentation

## 2011-01-25 DIAGNOSIS — T63461A Toxic effect of venom of wasps, accidental (unintentional), initial encounter: Secondary | ICD-10-CM | POA: Insufficient documentation

## 2011-01-25 DIAGNOSIS — E119 Type 2 diabetes mellitus without complications: Secondary | ICD-10-CM | POA: Insufficient documentation

## 2011-01-25 MED ORDER — TRIAMCINOLONE ACETONIDE 0.1 % EX CREA: TOPICAL_CREAM | Freq: Three times a day (TID) | CUTANEOUS | Status: AC

## 2011-01-25 MED ORDER — TRIAMCINOLONE ACETONIDE 0.1 % EX CREA: TOPICAL_CREAM | Freq: Three times a day (TID) | CUTANEOUS | Status: DC

## 2011-01-25 NOTE — ED Notes (Signed)
Patient stung by bee on Wednesday on L arm & arm continues to itch, red rash on arm growing larger

## 2011-01-25 NOTE — ED Provider Notes (Signed)
History    the patient is a 60 year old female who presents today after being stung by a bee in the left antecubital fossa. Subsequently she developed scattered. The medicine macules in the antecubital fossa that appear compatible with an allergic reaction. She does not have signs of cellulitis. The patient does not have a known prior existing allergy to bee stings. She denies any swelling of the throat, the tongue, difficulty swallowing, or difficulty in breathing. She denies any rash elsewhere. She denies fever chills or any other symptoms.  Chief Complaint  Patient presents with  . Insect Bite   Patient is a 60 y.o. female presenting with allergic reaction. The history is provided by the patient. No language interpreter was used.  Allergic Reaction The primary symptoms are  rash. The primary symptoms do not include wheezing, shortness of breath, cough, abdominal pain, nausea, vomiting, dizziness, palpitations, angioedema or urticaria. The current episode started yesterday. The problem has been gradually worsening. This is a new problem.  The rash began yesterday. The rash appears on the left arm. The pain associated with the rash is mild. The rash is associated with itching. The rash is not associated with blisters or weeping.  The onset of the reaction was associated with insect bite/sting. Significant symptoms also include itching. Significant symptoms that are not present include eye redness, flushing or rhinorrhea.    Past Medical History  Diagnosis Date  . Hypertension   . Hyperlipemia   . Dyspepsia   . OSA (obstructive sleep apnea)   . Plantar fasciitis   . Depression   . Asthma     02/08/10 FEV1 1.98 (80%), s/p saba 2.22 l/m (90%).nml  . Anxiety   . 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 Acute peptic ulcer of stomach     As a teenager  . Fatty liver 12/19/2010  . Hemorrhoids   . Diverticulosis     Past Surgical History  Procedure Date  . Knee arthroscopy 1998    left knee    . Toe surgery 1995    right  . Nasal septum surgery   . Rhinoplasty   . Appendectomy     age 73    Family History  Problem Relation Age of Onset  . Diabetes Mother     type II  . Heart disease Mother 70  . Colon cancer Neg Hx     History  Substance Use Topics  . Smoking status: Never Smoker   . Smokeless tobacco: Never Used  . Alcohol Use: No    OB History    Grav Para Term Preterm Abortions TAB SAB Ect Mult Living   4 4             Obstetric Comments   1 child died at 6 weeks (heart defects, perforated bowel)      Review of Systems  Constitutional: Negative for fever and chills.  HENT: Negative for rhinorrhea.   Eyes: Negative for redness.  Respiratory: Negative for cough, shortness of breath and wheezing.   Cardiovascular: Negative for palpitations.  Gastrointestinal: Negative for nausea, vomiting and abdominal pain.  Skin: Positive for itching and rash. Negative for flushing.  Neurological: Negative for dizziness, light-headedness and numbness.  Hematological: Negative for adenopathy.  Psychiatric/Behavioral: Negative.     Physical Exam  BP 141/90  Pulse 71  Temp(Src) 97.7 F (36.5 C) (Oral)  Resp 18  SpO2 99%  Physical Exam  Nursing note and vitals reviewed. Constitutional: She is oriented to  person, place, and time. She appears well-developed and well-nourished. No distress.  HENT:  Head: Normocephalic and atraumatic.  Right Ear: External ear normal.  Left Ear: External ear normal.  Nose: Nose normal.  Mouth/Throat: Oropharynx is clear and moist. No oropharyngeal exudate.  Eyes: Conjunctivae and EOM are normal. Pupils are equal, round, and reactive to light. Right eye exhibits no discharge. Left eye exhibits no discharge.  Neck: Normal range of motion. Neck supple. No tracheal deviation present.  Cardiovascular: Normal rate and regular rhythm.  Exam reveals no gallop and no friction rub.   No murmur heard. Pulmonary/Chest: Effort normal and  breath sounds normal. No stridor. No respiratory distress. She has no wheezes. She has no rales.  Abdominal: Soft. Bowel sounds are normal. She exhibits no distension. There is no tenderness. There is no rebound and no guarding.  Musculoskeletal: Normal range of motion. She exhibits no edema and no tenderness.  Lymphadenopathy:    She has no cervical adenopathy.  Neurological: She is alert and oriented to person, place, and time.  Skin: Skin is warm and dry. Lesion and rash noted. Rash is macular, papular and maculopapular. Rash is not pustular and not vesicular. She is not diaphoretic. There is erythema. No pallor.       ED Course  Procedures  MDM The patient does not appear to have cellulitis or skin infection, but does appear to have a localized allergic reaction to the insect sting. There are no systemic signs of allergic reaction. I will treat the patient locally with topical steroid cream.      Felisa Bonier, MD 01/25/11 1320

## 2011-02-06 ENCOUNTER — Ambulatory Visit: Payer: Self-pay | Admitting: Internal Medicine

## 2011-02-06 ENCOUNTER — Ambulatory Visit: Payer: BC Managed Care – PPO | Admitting: Internal Medicine

## 2011-03-05 ENCOUNTER — Telehealth: Payer: Self-pay | Admitting: *Deleted

## 2011-03-05 NOTE — Telephone Encounter (Signed)
Pt returned my call stating she is using [facility] and does not want to use [facility]. Faxed denial back to [phone]. Pt also states that she has stopped a lot of her medications but is not at home and can't remember the names of the ones that she has stopped. Pt concerned about soreness in her underarm area. Scheduled pt appt to discuss medications and soreness for 03/07/11 at 8:15.

## 2011-03-05 NOTE — Telephone Encounter (Signed)
Received form from [facility] For diabetic testing supplies. Form was completed on 08/05/10 for [facility]. Left message for pt to return my call re: new request received.

## 2011-03-07 ENCOUNTER — Ambulatory Visit (INDEPENDENT_AMBULATORY_CARE_PROVIDER_SITE_OTHER): Payer: BC Managed Care – PPO | Admitting: Family

## 2011-03-07 ENCOUNTER — Encounter: Payer: Self-pay | Admitting: Family

## 2011-03-07 ENCOUNTER — Telehealth: Payer: Self-pay | Admitting: *Deleted

## 2011-03-07 VITALS — BP 120/86 | HR 78 | Temp 97.6°F | Resp 16 | Ht 63.5 in | Wt 167.0 lb

## 2011-03-07 DIAGNOSIS — R928 Other abnormal and inconclusive findings on diagnostic imaging of breast: Secondary | ICD-10-CM

## 2011-03-07 DIAGNOSIS — K219 Gastro-esophageal reflux disease without esophagitis: Secondary | ICD-10-CM | POA: Insufficient documentation

## 2011-03-07 DIAGNOSIS — E785 Hyperlipidemia, unspecified: Secondary | ICD-10-CM

## 2011-03-07 DIAGNOSIS — I10 Essential (primary) hypertension: Secondary | ICD-10-CM

## 2011-03-07 DIAGNOSIS — E119 Type 2 diabetes mellitus without complications: Secondary | ICD-10-CM

## 2011-03-07 MED ORDER — CEPHALEXIN 500 MG PO CAPS: 500.0000 mg | ORAL_CAPSULE | Freq: Four times a day (QID) | ORAL | Status: AC

## 2011-03-07 MED ORDER — LISINOPRIL-HYDROCHLOROTHIAZIDE 20-12.5 MG PO TABS: 1.0000 | ORAL_TABLET | Freq: Every day | ORAL | Status: DC

## 2011-03-07 MED ORDER — RANITIDINE HCL 150 MG PO CAPS: 150.0000 mg | ORAL_CAPSULE | Freq: Two times a day (BID) | ORAL | Status: DC

## 2011-03-07 MED ORDER — SERTRALINE HCL 50 MG PO TABS: 50.0000 mg | ORAL_TABLET | Freq: Every day | ORAL | Status: DC

## 2011-03-07 MED ORDER — SIMVASTATIN 20 MG PO TABS: 20.0000 mg | ORAL_TABLET | Freq: Every day | ORAL | Status: DC

## 2011-03-07 MED ORDER — METFORMIN HCL 500 MG PO TABS: 500.0000 mg | ORAL_TABLET | Freq: Two times a day (BID) | ORAL | Status: DC

## 2011-03-07 NOTE — Assessment & Plan Note (Signed)
Will give trial of Zantac in place of Prilosec for cost purposes.

## 2011-03-07 NOTE — Progress Notes (Signed)
Subjective:    Patient ID: Crystal [name], female    DOB: February 16, 1951, 60 y.o.   MRN: [id number]  HPI [name] is a 60 year old female who presents today for follow up. She reports that she has stopped several of her meds due to cost issues and problems with the [facility] pharmacy that her insurer requires her to use.  She is allocating all of her resources for her husband's medications.  He is suffering from metastatic melanoma.   1) DM2- not taking meformin. reports that she is seeing between 114-125 fasting, post prandial, stays 145-147.    2) Pain left lateral breast for 2 weeks.   Due for mammogram.    3) GERD this has been well controlled. She ran out of prilosec and could not afford to refill.    Review of Systems See HPI  Past Medical History  Diagnosis Date  . Hypertension   . Hyperlipemia   . Dyspepsia   . OSA (obstructive sleep apnea)   . Plantar fasciitis   . Depression   . Asthma     02/08/10 FEV1 1.98 (80%), s/p saba 2.22 l/m (90%).nml  . Anxiety   . 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 Acute peptic ulcer of stomach     As a teenager  . Fatty liver 12/19/2010  . Hemorrhoids   . Diverticulosis     History   Social History  . Marital Status: Married    Spouse Name: [name]    Number of Children: 4  . 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 Smoking status: Never Smoker   . Smokeless tobacco: Never Used  . Alcohol Use: No  . Drug Use: No  . Sexually Active: Not on file   Other Topics Concern  . Not on file   Social History Narrative   No caffeine drinks daily     Past Surgical History  Procedure Date  . Knee arthroscopy 1998    left knee  . Toe surgery 1995    right  . Nasal septum surgery   . Rhinoplasty   . Appendectomy     age 16    Family History  Problem Relation Age of Onset  . Diabetes Mother     type II  . Heart disease Mother 60  . Colon cancer Neg Hx      Allergies  Allergen Reactions  . Prochlorperazine Edisylate     REACTION: coma for 3 days  . Tetanus Toxoid     REACTION: unknown    Current Outpatient Prescriptions on File Prior to Visit  Medication Sig Dispense Refill  . albuterol (PROAIR HFA) 108 (90 BASE) MCG/ACT inhaler Inhale 2 puffs into the lungs every 4 (four) hours as needed.        . fluticasone (VERAMYST) 27.5 MCG/SPRAY nasal spray 1 spray by Nasal route 2 (two) times daily.        . Fluticasone-Salmeterol (ADVAIR DISKUS) 250-50 MCG/DOSE AEPB Inhale 1 puff into the lungs 2 (two) times daily.  60 each  3  . loratadine (CLARITIN) 10 MG tablet Take 2 tablets by mouth daily.       . Multiple Vitamins-Minerals (CENTRUM SILVER PO) Take 1 tablet by mouth daily.        Marland Kitchen triamcinolone (KENALOG) 0.1 % cream Apply topically 3 (three) times daily. Apply to affected skin  30 g  0  BP 120/86  Pulse 78  Temp(Src) 97.6 F (36.4 C) (Oral)  Resp 16  Ht 5' 3.5" (1.613 m)  Wt 167 lb 0.6 oz (75.769 kg)  BMI 29.13 kg/m2       Objective:   Physical Exam  Constitutional: She appears well-developed and well-nourished. No distress.  Cardiovascular: Normal rate and regular rhythm.   Pulmonary/Chest: Effort normal and breath sounds normal. No respiratory distress. She has no wheezes. She has no rales. She exhibits no tenderness.  Abdominal: Soft. Bowel sounds are normal.  Musculoskeletal: She exhibits no edema.  Psychiatric: She has a normal mood and affect. Her behavior is normal. Judgment and thought content normal.  Breast: + tenderness left lateral breast.  No discrete masses note.         Assessment & Plan:

## 2011-03-07 NOTE — Assessment & Plan Note (Signed)
Change Hyzaar to lisinopril/HCTZ for cost purposes. Pt to f/u in 1 month for BMET and BP check.

## 2011-03-07 NOTE — Telephone Encounter (Signed)
Received message from pt that she has restarted the Align probiotic and requests that I add it back to her medication list. Medication has been added.

## 2011-03-07 NOTE — Assessment & Plan Note (Signed)
Pt tells me that she has not yet scheduled an apt for her follow up diagnostic mammogram at [facility].  (she tells me that they sent her a certified recall letter).  I have advised her to call today to arrange mammogram. Will treat empirically with keflex due to pain to cover any possible early cellulitis.

## 2011-03-07 NOTE — Assessment & Plan Note (Signed)
I have advised her to restart the metformin and have printed rx that she should be able to bring to [facility] and pay 4$ month cash for.

## 2011-03-07 NOTE — Patient Instructions (Signed)
Complete your lab work on the first floor. Call if you develop increased pain left breast or if pain does not resolve in the next 1 week. Complete your follow up mammogram this week.  Follow up in 1 month.

## 2011-03-07 NOTE — Assessment & Plan Note (Signed)
Will change lipitor to simvastatin for cost savings at [facility].

## 2011-03-08 LAB — HEMOGLOBIN A1C: Hgb A1c MFr Bld: 7.7 % — ABNORMAL HIGH (ref <5.7)

## 2011-03-10 ENCOUNTER — Encounter: Payer: Self-pay | Admitting: Family

## 2011-03-27 ENCOUNTER — Telehealth: Payer: Self-pay | Admitting: Family

## 2011-03-27 NOTE — Telephone Encounter (Signed)
Pls call pt and remind her to schedule her 6 month follow up mammogram at [facility] as soon as possible if she has not already done so. This is very important.

## 2011-03-28 NOTE — Telephone Encounter (Signed)
Left message on machine to return my call. 

## 2011-03-28 NOTE — Telephone Encounter (Signed)
Mammogram from 03/07/11 received, forwarded to provider and documented in health maintenance.

## 2011-03-28 NOTE — Telephone Encounter (Signed)
Called [phone] and requested mammogram result. Pam will fax result.

## 2011-03-28 NOTE — Telephone Encounter (Signed)
Pt states she had her mammogram completed about 1 month ago. Will check with [facility] to have them send [place] the result.

## 2011-03-28 NOTE — Telephone Encounter (Signed)
Patient returned phone call. Best# [phone]

## 2011-04-07 ENCOUNTER — Telehealth: Payer: Self-pay | Admitting: Family

## 2011-04-07 NOTE — Telephone Encounter (Signed)
Patient states that the last few times she has checked her blood sugar it has been running around 200. Last time she checked was about ago. I offered patient appointment today but she declined, stating that she has an appt later this week.

## 2011-04-08 ENCOUNTER — Emergency Department (HOSPITAL_BASED_OUTPATIENT_CLINIC_OR_DEPARTMENT_OTHER)
Admission: EM | Admit: 2011-04-08 | Discharge: 2011-04-08 | Disposition: A | Discharge status: Home / Self Care | Payer: BC Managed Care – PPO | Attending: Emergency Medicine | Admitting: Emergency Medicine

## 2011-04-08 ENCOUNTER — Emergency Department (INDEPENDENT_AMBULATORY_CARE_PROVIDER_SITE_OTHER): Payer: BC Managed Care – PPO

## 2011-04-08 ENCOUNTER — Encounter (HOSPITAL_BASED_OUTPATIENT_CLINIC_OR_DEPARTMENT_OTHER): Payer: Self-pay | Admitting: *Deleted

## 2011-04-08 DIAGNOSIS — Z79899 Other long term (current) drug therapy: Secondary | ICD-10-CM | POA: Insufficient documentation

## 2011-04-08 DIAGNOSIS — J069 Acute upper respiratory infection, unspecified: Secondary | ICD-10-CM | POA: Insufficient documentation

## 2011-04-08 DIAGNOSIS — I10 Essential (primary) hypertension: Secondary | ICD-10-CM | POA: Insufficient documentation

## 2011-04-08 DIAGNOSIS — R0989 Other specified symptoms and signs involving the circulatory and respiratory systems: Secondary | ICD-10-CM

## 2011-04-08 DIAGNOSIS — F329 Major depressive disorder, single episode, unspecified: Secondary | ICD-10-CM | POA: Insufficient documentation

## 2011-04-08 DIAGNOSIS — G4733 Obstructive sleep apnea (adult) (pediatric): Secondary | ICD-10-CM | POA: Insufficient documentation

## 2011-04-08 DIAGNOSIS — J45909 Unspecified asthma, uncomplicated: Secondary | ICD-10-CM | POA: Insufficient documentation

## 2011-04-08 DIAGNOSIS — R05 Cough: Secondary | ICD-10-CM

## 2011-04-08 DIAGNOSIS — F3289 Other specified depressive episodes: Secondary | ICD-10-CM | POA: Insufficient documentation

## 2011-04-08 DIAGNOSIS — E119 Type 2 diabetes mellitus without complications: Secondary | ICD-10-CM | POA: Insufficient documentation

## 2011-04-08 DIAGNOSIS — E785 Hyperlipidemia, unspecified: Secondary | ICD-10-CM | POA: Insufficient documentation

## 2011-04-08 NOTE — Telephone Encounter (Signed)
Left message on machine for pt to return my call  

## 2011-04-08 NOTE — ED Notes (Signed)
Pt c/o URI symptoms  x 1 day  

## 2011-04-08 NOTE — ED Notes (Signed)
Crackles noted in [facility].

## 2011-04-08 NOTE — ED Notes (Signed)
Patient states she has the "flu".  Started last night, generalized body aches, nausea, non-productive cough.

## 2011-04-08 NOTE — ED Provider Notes (Addendum)
History     CSN: [id number] Arrival date & time: 04/08/2011  5:20 PM  Chief Complaint  Patient presents with  . URI    HPI  (Consider location/radiation/quality/duration/timing/severity/associated sxs/prior treatment)  HPI Patient states nauseated beginning last night then today felt sick and tired.  No fever but had chills today.  Some rhinnorhea and congestion, and cough but nonproductive.  Taking po ok today.  BS yesterday was around 200 but states did not take medicine but took today.  Positive sick contacts- patient states multiple people at work sick.   Past Medical History  Diagnosis Date  . Hypertension   . Hyperlipemia   . Dyspepsia   . OSA (obstructive sleep apnea)   . Plantar fasciitis   . Depression   . Asthma     02/08/10 FEV1 1.98 (80%), s/p saba 2.22 l/m (90%).nml  . Anxiety   . 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 Acute peptic ulcer of stomach     As a teenager  . Fatty liver 12/19/2010  . Hemorrhoids   . Diverticulosis     Past Surgical History  Procedure Date  . Knee arthroscopy 1998    left knee  . Toe surgery 1995    right  . Nasal septum surgery   . Rhinoplasty   . Appendectomy     age 52    Family History  Problem Relation Age of Onset  . Diabetes Mother     type II  . Heart disease Mother 27  . Colon cancer Neg Hx     History  Substance Use Topics  . Smoking status: Never Smoker   . Smokeless tobacco: Never Used  . Alcohol Use: No    OB History    Grav Para Term Preterm Abortions TAB SAB Ect Mult Living   4 4             Obstetric Comments   1 child died at 6 weeks (heart defects, perforated bowel)      Review of Systems  Review of Systems  All other systems reviewed and are negative.    Allergies  Prochlorperazine edisylate and Tetanus toxoid  Home Medications   Current Outpatient Rx  Name Route Sig Dispense Refill  . ALBUTEROL SULFATE HFA 108 (90 BASE) MCG/ACT IN AERS Inhalation Inhale 2 puffs into the lungs  every 4 (four) hours as needed. Shortness of breath and wheezing    . LISINOPRIL-HYDROCHLOROTHIAZIDE 20-12.5 MG PO TABS Oral Take 1 tablet by mouth daily. 90 tablet 1  . LORATADINE 10 MG PO TABS  Take 2 tablets by mouth daily.     Marland Kitchen METFORMIN HCL 500 MG PO TABS Oral Take 1 tablet (500 mg total) by mouth 2 (two) times daily with a meal. 180 tablet 1  . CENTRUM SILVER PO Oral Take 1 tablet by mouth daily.      Marland Kitchen ALIGN 4 MG PO CAPS Oral Take 1 capsule by mouth daily.      Marland Kitchen RANITIDINE HCL 150 MG PO CAPS Oral Take 1 capsule (150 mg total) by mouth 2 (two) times daily. 180 capsule 1  . SERTRALINE HCL 50 MG PO TABS Oral Take 1 tablet (50 mg total) by mouth daily. 90 tablet 1  . SIMVASTATIN 20 MG PO TABS Oral Take 1 tablet (20 mg total) by mouth at bedtime. 90 tablet 0  . FLUTICASONE FUROATE 27.5 MCG/SPRAY NA SUSP Nasal 1 spray by Nasal route 2 (two) times daily.      Marland Kitchen  FLUTICASONE-SALMETEROL 250-50 MCG/DOSE IN AEPB Inhalation Inhale 1 puff into the lungs 2 (two) times daily. 60 each 3  . TRIAMCINOLONE ACETONIDE 0.1 % EX CREA Topical Apply topically 3 (three) times daily. Apply to affected skin 30 g 0    Physical Exam    BP 112/74  Pulse 64  Temp(Src) 98.6 F (37 C) (Oral)  Resp 16  Ht 5\' 3"  (1.6 m)  Wt 160 lb (72.576 kg)  BMI 28.34 kg/m2  SpO2 100%  Physical Exam  Nursing note and vitals reviewed. Constitutional: She is oriented to person, place, and time. She appears well-developed and well-nourished.  HENT:  Head: Normocephalic and atraumatic.  Right Ear: External ear normal.  Left Ear: External ear normal.  Mouth/Throat: Oropharynx is clear and moist.  Eyes: Conjunctivae are normal. Pupils are equal, round, and reactive to light.  Neck: Normal range of motion. Neck supple.  Cardiovascular: Normal rate.   Pulmonary/Chest: Effort normal and breath sounds normal.  Abdominal: Soft. Bowel sounds are normal.  Musculoskeletal: Normal range of motion.  Neurological: She is alert and  oriented to person, place, and time. She has normal reflexes.  Skin: Skin is warm and dry.  Psychiatric: She has a normal mood and affect. Her behavior is normal.    ED Course  Procedures (including critical care time)  VS normal   Labs Reviewed - No data to display Dg Chest 2 View  04/08/2011  *RADIOLOGY REPORT*  Clinical Data: Cough, congestion, body aches  CHEST - 2 VIEW  Comparison: 07/12/2010  Findings: Unchanged cardiac silhouette and mediastinal contours. No focal parenchymal opacities.  No pleural effusion or pneumothorax.  Surgical clips overlie the expected location of the gastroesophageal junction.  Unchanged bones.  IMPRESSION: No acute cardiopulmonary disease.  Specifically, no pneumonia.  Original Report Authenticated By: Waynard Reeds, M.D.     No diagnosis found.   MDM Dg Chest 2 View  04/08/2011  *RADIOLOGY REPORT*  Clinical Data: Cough, congestion, body aches  CHEST - 2 VIEW  Comparison: 07/12/2010  Findings: Unchanged cardiac silhouette and mediastinal contours. No focal parenchymal opacities.  No pleural effusion or pneumothorax.  Surgical clips overlie the expected location of the gastroesophageal junction.  Unchanged bones.  IMPRESSION: No acute cardiopulmonary disease.  Specifically, no pneumonia.  Original Report Authenticated By: Waynard Reeds, M.D.         Hilario Quarry, MD 04/08/11 0454  Hilario Quarry, MD 04/21/11 1145

## 2011-04-10 ENCOUNTER — Ambulatory Visit (INDEPENDENT_AMBULATORY_CARE_PROVIDER_SITE_OTHER): Payer: BC Managed Care – PPO | Admitting: Internal Medicine

## 2011-04-10 ENCOUNTER — Encounter: Payer: Self-pay | Admitting: Internal Medicine

## 2011-04-10 DIAGNOSIS — J069 Acute upper respiratory infection, unspecified: Secondary | ICD-10-CM

## 2011-04-10 DIAGNOSIS — E119 Type 2 diabetes mellitus without complications: Secondary | ICD-10-CM

## 2011-04-10 MED ORDER — METFORMIN HCL 500 MG PO TABS: 500.0000 mg | ORAL_TABLET | Freq: Three times a day (TID) | ORAL | Status: DC

## 2011-04-10 MED ORDER — DOXYCYCLINE HYCLATE 100 MG PO TABS: 100.0000 mg | ORAL_TABLET | Freq: Two times a day (BID) | ORAL | Status: AC

## 2011-04-10 NOTE — Assessment & Plan Note (Signed)
suboptimal control. Increase glucophage tid. Monitor fsbs and report to clinic.

## 2011-04-10 NOTE — Telephone Encounter (Signed)
Pt was evaluated in the office today.  ?

## 2011-04-10 NOTE — Assessment & Plan Note (Signed)
Provided with abx prescription to hold. Begin abx if no improvement of sx's over next 24-48 hours. Followup if no improvement or worsening.

## 2011-04-10 NOTE — Progress Notes (Signed)
  Subjective:    Patient ID: Crystal [name], female    DOB: Aug 22, 1950, 60 y.o.   MRN: [id number]  HPI Pt presents to clinic for evaluation of URI sx's. Seen ED ~ 3d ago with chest congestion. CXR obtained reviewed as nl. Given no medication. Notes continued np cough and chest congestion without dyspnea or wheezing. Notes fsbs's ~200 without hypoglycemia. Taking metformin bid without gi adverse effects. No exacerbating or alleviating factors. No other complaints.  Past Medical History  Diagnosis Date  . Hypertension   . Hyperlipemia   . Dyspepsia   . OSA (obstructive sleep apnea)   . Plantar fasciitis   . Depression   . Asthma     02/08/10 FEV1 1.98 (80%), s/p saba 2.22 l/m (90%).nml  . Anxiety   . 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 Acute peptic ulcer of stomach     As a teenager  . Fatty liver 12/19/2010  . Hemorrhoids   . Diverticulosis    Past Surgical History  Procedure Date  . Knee arthroscopy 1998    left knee  . Toe surgery 1995    right  . Nasal septum surgery   . Rhinoplasty   . Appendectomy     age 61    reports that she has never smoked. She has never used smokeless tobacco. She reports that she does not drink alcohol or use illicit drugs. family history includes Diabetes in her mother and Heart disease (age of onset:50) in her mother.  There is no history of Colon cancer. Allergies  Allergen Reactions  . Prochlorperazine Edisylate     REACTION: coma for 3 days  . Tetanus Toxoid     REACTION: unknown       Review of Systems see hpi     Objective:   Physical Exam  Nursing note and vitals reviewed. Constitutional: She appears well-developed and well-nourished. No distress.  HENT:  Head: Normocephalic and atraumatic.  Right Ear: Tympanic membrane, external ear and ear canal normal.  Left Ear: Tympanic membrane, external ear and ear canal normal.  Nose: Nose normal.  Mouth/Throat: Oropharynx is clear and moist. No oropharyngeal exudate.  Eyes:  Conjunctivae are normal. Right eye exhibits no discharge. Left eye exhibits no discharge. No scleral icterus.  Neck: Neck supple.  Cardiovascular: Normal rate, regular rhythm and normal heart sounds.  Exam reveals no gallop and no friction rub.   No murmur heard. Pulmonary/Chest: Breath sounds normal. No respiratory distress. She has no wheezes. She has no rales.  Lymphadenopathy:    She has no cervical adenopathy.  Neurological: She is alert.  Skin: Skin is warm and dry. She is not diaphoretic.  Psychiatric: She has a normal mood and affect.          Assessment & Plan:

## 2011-04-11 ENCOUNTER — Ambulatory Visit: Payer: BC Managed Care – PPO | Admitting: Family

## 2011-05-26 ENCOUNTER — Ambulatory Visit: Payer: BC Managed Care – PPO | Admitting: Family

## 2011-05-26 ENCOUNTER — Encounter: Payer: Self-pay | Admitting: Family

## 2011-05-26 ENCOUNTER — Ambulatory Visit (INDEPENDENT_AMBULATORY_CARE_PROVIDER_SITE_OTHER): Payer: BC Managed Care – PPO | Admitting: Family

## 2011-05-26 VITALS — BP 110/76 | HR 78 | Temp 97.8°F | Resp 16 | Ht 63.5 in | Wt 166.1 lb

## 2011-05-26 DIAGNOSIS — Z23 Encounter for immunization: Secondary | ICD-10-CM

## 2011-05-26 DIAGNOSIS — IMO0001 Reserved for inherently not codable concepts without codable children: Secondary | ICD-10-CM

## 2011-05-26 DIAGNOSIS — R928 Other abnormal and inconclusive findings on diagnostic imaging of breast: Secondary | ICD-10-CM

## 2011-05-26 DIAGNOSIS — I10 Essential (primary) hypertension: Secondary | ICD-10-CM

## 2011-05-26 DIAGNOSIS — R61 Generalized hyperhidrosis: Secondary | ICD-10-CM

## 2011-05-26 DIAGNOSIS — M5432 Sciatica, left side: Secondary | ICD-10-CM | POA: Insufficient documentation

## 2011-05-26 DIAGNOSIS — M5430 Sciatica, unspecified side: Secondary | ICD-10-CM

## 2011-05-26 DIAGNOSIS — E119 Type 2 diabetes mellitus without complications: Secondary | ICD-10-CM

## 2011-05-26 MED ORDER — CYCLOBENZAPRINE HCL 5 MG PO TABS: 5.0000 mg | ORAL_TABLET | Freq: Every evening | ORAL | Status: AC | PRN

## 2011-05-26 MED ORDER — MELOXICAM 7.5 MG PO TABS: 7.5000 mg | ORAL_TABLET | Freq: Every day | ORAL | Status: DC

## 2011-05-26 NOTE — Progress Notes (Signed)
Subjective:    Patient ID: Crystal [name], female    DOB: [birth year], 60 y.o.   MRN: [id number]  HPI  [name] is a 60 yr old female who presents today for follow up.  1) DM2- she is maintained on metformin. Last A1C was 8/24 and was 7.7. She reports that she is not checking her sugars regularly, but has been <120. Her sugars have improved since she has increased her metformin to TID.    2) HTN- Currently on lisinipril-hctz.  Denies chest pain, swelling in feet or shortness of breath.  3) Hip Pain- Pt reports + left sided hip pain. Started in the middle of the night.  Pain is continuous. She reports previous pain in the past, but not to this extent.  Unable to walk comfortably. Denies injury.   4) Abnormal mammogram- She had follow up performed in the end of August which was stable.  Recommendation was for 1 year follow up diagnostic mammogram.    Review of Systems See HPI  Past Medical History  Diagnosis Date  . Hypertension   . Hyperlipemia   . Dyspepsia   . OSA (obstructive sleep apnea)   . Plantar fasciitis   . Depression   . Asthma     02/08/10 FEV1 1.98 (80%), s/p saba 2.22 l/m (90%).nml  . Anxiety   . 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 Acute peptic ulcer of stomach     As a teenager  . Fatty liver 12/19/2010  . Hemorrhoids   . Diverticulosis     History   Social History  . Marital Status: Married    Spouse Name: Crystal [name]    Number of Children: 4  . Years of Education: N/A   Occupational History  . DATA ENTRY [name]   Social History Main Topics  . Smoking status: Never Smoker   . Smokeless tobacco: Never Used  . Alcohol Use: No  . Drug Use: No  . Sexually Active: Not on file   Other Topics Concern  . Not on file   Social History Narrative   No caffeine drinks daily     Past Surgical History  Procedure Date  . Knee arthroscopy 1998    left knee  . Toe surgery 1995    right  . Nasal septum surgery   . Rhinoplasty   . Appendectomy     age  55    Family History  Problem Relation Age of Onset  . Diabetes Mother     type II  . Heart disease Mother 50  . Colon cancer Neg Hx     Allergies  Allergen Reactions  . Prochlorperazine Edisylate     REACTION: coma for 3 days  . Tetanus Toxoid     REACTION: unknown    Current Outpatient Prescriptions on File Prior to Visit  Medication Sig Dispense Refill  . albuterol (PROAIR HFA) 108 (90 BASE) MCG/ACT inhaler Inhale 2 puffs into the lungs every 4 (four) hours as needed. Shortness of breath and wheezing      . fluticasone (VERAMYST) 27.5 MCG/SPRAY nasal spray 1 spray by Nasal route 2 (two) times daily.        . Fluticasone-Salmeterol (ADVAIR DISKUS) 250-50 MCG/DOSE AEPB Inhale 1 puff into the lungs 2 (two) times daily.  60 each  3  . lisinopril-hydrochlorothiazide (PRINZIDE,ZESTORETIC) 20-12.5 MG per tablet Take 1 tablet by mouth daily.  90 tablet  1  . loratadine (CLARITIN) 10 MG tablet Take 2 tablets  by mouth daily.       . metFORMIN (GLUCOPHAGE) 500 MG tablet Take 1 tablet (500 mg total) by mouth 3 (three) times daily with meals.  90 tablet  6  . Multiple Vitamins-Minerals (CENTRUM SILVER PO) Take 1 tablet by mouth daily.        . Probiotic Product (ALIGN) 4 MG CAPS Take 1 capsule by mouth daily.        . ranitidine (ZANTAC) 150 MG capsule Take 1 capsule (150 mg total) by mouth 2 (two) times daily.  180 capsule  1  . sertraline (ZOLOFT) 50 MG tablet Take 1 tablet (50 mg total) by mouth daily.  90 tablet  1  . simvastatin (ZOCOR) 20 MG tablet Take 1 tablet (20 mg total) by mouth at bedtime.  90 tablet  0  . triamcinolone (KENALOG) 0.1 % cream Apply topically 3 (three) times daily. Apply to affected skin  30 g  0    BP 110/76  Pulse 78  Temp(Src) 97.8 F (36.6 C) (Oral)  Resp 16  Ht 5' 3.5" (1.613 m)  Wt 166 lb 1.3 oz (75.333 kg)  BMI 28.96 kg/m2       Objective:   Physical Exam  Constitutional: She appears well-developed and well-nourished. No distress.    Cardiovascular: Normal rate and regular rhythm.   No murmur heard. Pulmonary/Chest: Effort normal and breath sounds normal. No respiratory distress. She has no wheezes. She has no rales. She exhibits no tenderness.  Abdominal: Bowel sounds are normal.  Musculoskeletal:       + straight leg raise on left.  Bilateral LE stength is 5/5  Neurological:  Reflex Scores:      Patellar reflexes are 2+ on the right side and 2+ on the left side.      Achilles reflexes are 2+ on the right side and 2+ on the left side. Psychiatric: She has a normal mood and affect. Her speech is normal and behavior is normal.          Assessment & Plan:

## 2011-05-26 NOTE — Assessment & Plan Note (Signed)
Likely exacerbated by recent travel on plane.  Recommended short course of NSAIDS, Flexeril HS.  If no improvement in a few weeks or if symptoms worsen, obtain MRI.

## 2011-05-26 NOTE — Assessment & Plan Note (Signed)
Needs follow up diagnostic mammo 8/13.

## 2011-05-26 NOTE — Assessment & Plan Note (Signed)
Per report her sugars are improving.  Continue metformin.  Will check A1C first week of December.

## 2011-05-26 NOTE — Patient Instructions (Addendum)
Call if your symptoms worsen or if you are not feeling better in 2 weeks. Follow up in about 3 1/2 months. Return after December 1st to the lab for an A1C.  Sciatica Sciatica is a weakness and/or changes in sensation (tingling, jolts, hot and cold, numbness) along the path the sciatic nerve travels. Irritation or damage to lumbar nerve roots is often also referred to as lumbar radiculopathy.  Lumbar radiculopathy (Sciatica) is the most common form of this problem. Radiculopathy can occur in any of the nerves coming out of the spinal cord. The problems caused depend on which nerves are involved. The sciatic nerve is the large nerve supplying the branches of nerves going from the hip to the toes. It often causes a numbness or weakness in the skin and/or muscles that the sciatic nerve serves. It also may cause symptoms (problems) of pain, burning, tingling, or electric shock-like feelings in the path of this nerve. This usually comes from injury to the fibers that make up the sciatic nerve. Some of these symptoms are low back pain and/or unpleasant feelings in the following areas:  From the mid-buttock down the back of the leg to the back of the knee.   And/or the outside of the calf and top of the foot.   And/or behind the inner ankle to the sole of the foot.  CAUSES   Herniated or slipped disc. Discs are the little cushions between the bones in the back.   Pressure by the piriformis muscle in the buttock on the sciatic nerve (Piriformis Syndrome).   Misalignment of the bones in the lower back and buttocks (Sacroiliac Joint Derangement).   Narrowing of the spinal canal that puts pressure on or pinches the fibers that make up the sciatic nerve.   A slipped vertebra that is out of line with those above or beneath it.   Abnormality of the nervous system itself so that nerve fibers do not transmit signals properly, especially to feet and calves (neuropathy).   Tumor (this is rare).  Your  caregiver can usually determine the cause of your sciatica and begin the treatment most likely to help you. TREATMENT  Taking over-the-counter painkillers, physical therapy, rest, exercise, spinal manipulation, and injections of anesthetics and/or steroids may be used. Surgery, acupuncture, and Yoga can also be effective. Mind over matter techniques, mental imagery, and changing factors such as your bed, chair, desk height, posture, and activities are other treatments that may be helpful. You and your caregiver can help determine what is best for you. With proper diagnosis, the cause of most sciatica can be identified and removed. Communication and cooperation between your caregiver and you is essential. If you are not successful immediately, do not be discouraged. With time, a proper treatment can be found that will make you comfortable. HOME CARE INSTRUCTIONS   If the pain is coming from a problem in the back, applying ice to that area for 15 to 20 minutes, 3 to 4 times per day while awake, may be helpful. Put the ice in a plastic bag. Place a towel between the bag of ice and your skin.   You may exercise or perform your usual activities if these do not aggravate your pain, or as suggested by your caregiver.   Only take over-the-counter or prescription medicines for pain, discomfort, or fever as directed by your caregiver.   If your caregiver has given you a follow-up appointment, it is very important to keep that appointment. Not keeping the appointment  could result in a chronic or permanent injury, pain, and disability. If there is any problem keeping the appointment, you must call back to this facility for assistance.  SEEK IMMEDIATE MEDICAL CARE IF:   You experience loss of control of bowel or bladder.   You have increasing weakness in the trunk, buttocks, or legs.   There is numbness in any areas from the hip down to the toes.   You have difficulty walking or keeping your balance.   You  have any of the above, with fever or forceful vomiting.  Document Released: 06/24/2001 Document Revised: 03/12/2011 Document Reviewed: 02/11/2008 [facility] Patient Information 2012 [place], [place].

## 2011-05-26 NOTE — Assessment & Plan Note (Signed)
BP is stable. Continue lisinopril HCTZ, obtain bmet.

## 2011-05-27 ENCOUNTER — Encounter: Payer: Self-pay | Admitting: Family

## 2011-05-27 ENCOUNTER — Telehealth: Payer: Self-pay | Admitting: *Deleted

## 2011-05-27 DIAGNOSIS — E119 Type 2 diabetes mellitus without complications: Secondary | ICD-10-CM

## 2011-05-27 LAB — BASIC METABOLIC PANEL
BUN: 17 mg/dL (ref 6–23)
Creat: 0.76 mg/dL (ref 0.50–1.10)
Glucose, Bld: 152 mg/dL — ABNORMAL HIGH (ref 70–99)
Potassium: 4.4 mEq/L (ref 3.5–5.3)

## 2011-05-27 LAB — TSH: TSH: 4.064 u[IU]/mL (ref 0.350–4.500)

## 2011-05-27 NOTE — Telephone Encounter (Signed)
Message copied by Kathi Simpers on Tue May 27, 2011  5:11 PM ------      Message from: O'SULLIVAN, MELISSA      Created: Mon May 26, 2011  4:19 PM       pls send A1C to the lab for 12/1 250.00

## 2011-05-27 NOTE — Telephone Encounter (Signed)
Order entered and forwarded to the lab. 

## 2011-06-13 ENCOUNTER — Telehealth: Payer: Self-pay | Admitting: *Deleted

## 2011-06-13 MED ORDER — MELOXICAM 7.5 MG PO TABS: 7.5000 mg | ORAL_TABLET | Freq: Every day | ORAL | Status: DC

## 2011-06-13 NOTE — Telephone Encounter (Signed)
Pt called requesting refill of Meloxicam. #15 x no refills sent to pharmacy. Pt notified.

## 2011-06-18 ENCOUNTER — Encounter: Payer: Self-pay | Admitting: Family

## 2011-06-18 ENCOUNTER — Ambulatory Visit (INDEPENDENT_AMBULATORY_CARE_PROVIDER_SITE_OTHER): Payer: BC Managed Care – PPO | Admitting: Family

## 2011-06-18 VITALS — BP 130/82 | HR 78 | Temp 97.9°F | Resp 16 | Wt 165.1 lb

## 2011-06-18 DIAGNOSIS — K529 Noninfective gastroenteritis and colitis, unspecified: Secondary | ICD-10-CM | POA: Insufficient documentation

## 2011-06-18 DIAGNOSIS — K5289 Other specified noninfective gastroenteritis and colitis: Secondary | ICD-10-CM

## 2011-06-18 DIAGNOSIS — R197 Diarrhea, unspecified: Secondary | ICD-10-CM

## 2011-06-18 LAB — CBC WITH DIFFERENTIAL/PLATELET
Basophils Relative: 0 % (ref 0–1)
Eosinophils Absolute: 0.1 10*3/uL (ref 0.0–0.7)
MCH: 30.7 pg (ref 26.0–34.0)
MCHC: 33.5 g/dL (ref 30.0–36.0)
Neutro Abs: 3 10*3/uL (ref 1.7–7.7)
Neutrophils Relative %: 50 % (ref 43–77)
Platelets: 261 10*3/uL (ref 150–400)
RBC: 4.17 MIL/uL (ref 3.87–5.11)

## 2011-06-18 LAB — HEPATIC FUNCTION PANEL
Albumin: 4.4 g/dL (ref 3.5–5.2)
Indirect Bilirubin: 0.4 mg/dL (ref 0.0–0.9)
Total Bilirubin: 0.5 mg/dL (ref 0.3–1.2)
Total Protein: 7.2 g/dL (ref 6.0–8.3)

## 2011-06-18 LAB — BASIC METABOLIC PANEL
CO2: 24 mEq/L (ref 19–32)
Calcium: 9.1 mg/dL (ref 8.4–10.5)
Creat: 0.71 mg/dL (ref 0.50–1.10)
Sodium: 138 mEq/L (ref 135–145)

## 2011-06-18 MED ORDER — METRONIDAZOLE 500 MG PO TABS: 500.0000 mg | ORAL_TABLET | Freq: Three times a day (TID) | ORAL | Status: AC

## 2011-06-18 NOTE — Progress Notes (Signed)
Subjective:    Patient ID: Crystal [name], female    DOB: 1951/04/09, 60 y.o.   MRN: [id number]  HPI  [name] is a 60 yr old female who presents today with chief complaint of diarrhea.  She reports that she has had diarrhea x 2 weeks.  Course is unchanged. She reports + associated vomitting this AM.  She denies known fever.  She has continued to work. Though her employer sent her home today.  She notes some mild soreness of her stomach but no sharp pain.  Denies black or bloody stools.  Notes that her urine is very yellow.     Review of Systems    see hpi  Past Medical History  Diagnosis Date  . Hypertension   . Hyperlipemia   . Dyspepsia   . OSA (obstructive sleep apnea)   . Plantar fasciitis   . Depression   . Asthma     02/08/10 FEV1 1.98 (80%), s/p saba 2.22 l/m (90%).nml  . Anxiety   . 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 Acute peptic ulcer of stomach     As a teenager  . Fatty liver 12/19/2010  . Hemorrhoids   . Diverticulosis     History   Social History  . Marital Status: Married    Spouse Name: [name]    Number of Children: 4  . 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 Smoking status: Never Smoker   . Smokeless tobacco: Never Used  . Alcohol Use: No  . Drug Use: No  . Sexually Active: Not on file   Other Topics Concern  . Not on file   Social History Narrative   No caffeine drinks daily     Past Surgical History  Procedure Date  . Knee arthroscopy 1998    left knee  . Toe surgery 1995    right  . Nasal septum surgery   . Rhinoplasty   . Appendectomy     age 65    Family History  Problem Relation Age of Onset  . Diabetes Mother     type II  . Heart disease Mother 51  . Colon cancer Neg Hx     Allergies  Allergen Reactions  . Prochlorperazine Edisylate     REACTION: coma for 3 days  . Tetanus Toxoid     REACTION: unknown    Current Outpatient Prescriptions on File  Prior to Visit  Medication Sig Dispense Refill  . albuterol (PROAIR HFA) 108 (90 BASE) MCG/ACT inhaler Inhale 2 puffs into the lungs every 4 (four) hours as needed. Shortness of breath and wheezing      . fluticasone (VERAMYST) 27.5 MCG/SPRAY nasal spray 1 spray by Nasal route 2 (two) times daily.        . Fluticasone-Salmeterol (ADVAIR DISKUS) 250-50 MCG/DOSE AEPB Inhale 1 puff into the lungs 2 (two) times daily.  60 each  3  . lisinopril-hydrochlorothiazide (PRINZIDE,ZESTORETIC) 20-12.5 MG per tablet Take 1 tablet by mouth daily.  90 tablet  1  . loratadine (CLARITIN) 10 MG tablet Take 2 tablets by mouth daily.       . metFORMIN (GLUCOPHAGE) 500 MG tablet Take 1 tablet (500 mg total) by mouth 3 (three) times daily with meals.  90 tablet  6  . Multiple Vitamins-Minerals (CENTRUM SILVER PO) Take 1 tablet by mouth daily.        . Probiotic  Product (ALIGN) 4 MG CAPS Take 1 capsule by mouth daily.        . ranitidine (ZANTAC) 150 MG capsule Take 1 capsule (150 mg total) by mouth 2 (two) times daily.  180 capsule  1  . sertraline (ZOLOFT) 50 MG tablet Take 1 tablet (50 mg total) by mouth daily.  90 tablet  1  . simvastatin (ZOCOR) 20 MG tablet Take 1 tablet (20 mg total) by mouth at bedtime.  90 tablet  0  . triamcinolone (KENALOG) 0.1 % cream Apply topically 3 (three) times daily. Apply to affected skin  30 g  0    BP 130/82  Pulse 78  Temp(Src) 97.9 F (36.6 C) (Oral)  Resp 16  Wt 165 lb 1.3 oz (74.88 kg)  SpO2 96%     Objective:   Physical Exam  Constitutional: She appears well-developed and well-nourished. No distress.  Cardiovascular: Normal rate, regular rhythm and normal heart sounds.   Pulmonary/Chest: Effort normal and breath sounds normal. No respiratory distress. She has no wheezes. She has no rales. She exhibits no tenderness.  Abdominal: Soft. Bowel sounds are normal.       Mild generalized tenderness-non focal without guarding. Soft normal bowel sounds  Psychiatric: She  has a normal mood and affect. Her behavior is normal. Judgment and thought content normal.          Assessment & Plan:

## 2011-06-18 NOTE — Patient Instructions (Signed)
Viral Gastroenteritis Viral gastroenteritis is also known as stomach flu. This condition affects the stomach and intestinal tract. The illness typically lasts 3 to 8 days. Most people develop an immune response. This eventually gets rid of the virus. While this natural response develops, the virus can make you quite ill.  CAUSES  Diarrhea and vomiting are often caused by a virus. Medicines (antibiotics) that kill germs will not help unless there is also a germ (bacterial) infection. SYMPTOMS  The most common symptom is diarrhea. This can cause severe loss of fluids (dehydration) and body salt (electrolyte) imbalance. TREATMENT  Treatments for this illness are aimed at rehydration. Antidiarrheal medicines are not recommended. They do not decrease diarrhea volume and may be harmful. Usually, home treatment is all that is needed. The most serious cases involve vomiting so severely that you are not able to keep down fluids taken by mouth (orally). In these cases, intravenous (IV) fluids are needed. Vomiting with viral gastroenteritis is common, but it will usually go away with treatment. HOME CARE INSTRUCTIONS  Small amounts of fluids should be taken frequently. Large amounts at one time may not be tolerated. Plain water may be harmful in infants and the elderly. Oral rehydration solutions (ORS) are available at pharmacies and grocery stores. ORS replace water and important electrolytes in proper proportions. Sports drinks are not as effective as ORS and may be harmful due to sugars worsening diarrhea.  As a general guideline for children, replace any new fluid losses from diarrhea or vomiting with ORS as follows:   If your child weighs 22 pounds or under (10 kg or less), give 60-120 mL (1/4 - 1/2 cup or 2 - 4 ounces) of ORS for each diarrheal stool or vomiting episode.   If your child weighs more than 22 pounds (more than 10 kgs), give 120-240 mL (1/2 - 1 cup or 4 - 8 ounces) of ORS for each diarrheal  stool or vomiting episode.   In a child with vomiting, it may be helpful to give the above ORS replacement in 5 mL (1 teaspoon) amounts every 5 minutes, then increase as tolerated.   While correcting for dehydration, children should eat normally. However, foods high in sugar should be avoided because this may worsen diarrhea. Large amounts of carbonated soft drinks, juice, gelatin desserts, and other highly sugared drinks should be avoided.   After correction of dehydration, other liquids that are appealing to the child may be added. Children should drink small amounts of fluids frequently and fluids should be increased as tolerated.   Adults should eat normally while drinking more fluids than usual. Drink small amounts of fluids frequently and increase as tolerated. Drink enough water and fluids to keep your urine clear or pale yellow. Broths, weak decaffeinated tea, lemon-lime soft drinks (allowed to go flat), and ORS replace fluids and electrolytes.   Avoid:   Carbonated drinks.   Juice.   Extremely hot or cold fluids.   Caffeine drinks.   Fatty, greasy foods.   Alcohol.   Tobacco.   Too much intake of anything at one time.   Gelatin desserts.   Probiotics are active cultures of beneficial bacteria. They may lessen the amount and number of diarrheal stools in adults. Probiotics can be found in yogurt with active cultures and in supplements.   Wash your hands well to avoid spreading bacteria and viruses.   Antidiarrheal medicines are not recommended for infants and children.   Only take over-the-counter or prescription medicines for   pain, discomfort, or fever as directed by your caregiver. Do not give aspirin to children.   For adults with dehydration, ask your caregiver if you should continue all prescribed and over-the-counter medicines.   If your caregiver has given you a follow-up appointment, it is very important to keep that appointment. Not keeping the appointment  could result in a lasting (chronic) or permanent injury and disability. If there is any problem keeping the appointment, you must call to reschedule.  SEEK IMMEDIATE MEDICAL CARE IF:   You are unable to keep fluids down.   There is no urine output in 6 to 8 hours or there is only a small amount of very dark urine.   You develop shortness of breath.   There is blood in the vomit (may look like coffee grounds) or stool.   Belly (abdominal) pain develops, increases, or localizes.   There is persistent vomiting or diarrhea.   You have a fever.   Your baby is older than 3 months with a rectal temperature of 102 F (38.9 C) or higher.   Your baby is 3 months old or younger with a rectal temperature of 100.4 F (38 C) or higher.  MAKE SURE YOU:   Understand these instructions.   Will watch your condition.   Will get help right away if you are not doing well or get worse.  Document Released: 06/30/2005 Document Revised: 03/12/2011 Document Reviewed: 11/11/2006 [employer] Patient Information 2012 [employer]. 

## 2011-06-18 NOTE — Assessment & Plan Note (Signed)
Will send stool studies, check laboratories as below, ads empiric flagyl. If symptoms worsen or do not improve, consider ct and gi referral.

## 2011-06-20 ENCOUNTER — Telehealth: Payer: Self-pay | Admitting: Family

## 2011-06-20 LAB — CLOSTRIDIUM DIFFICILE BY PCR: Toxigenic C. Difficile by PCR: NOT DETECTED

## 2011-06-20 LAB — OVA AND PARASITE SCREEN: OP: NONE SEEN

## 2011-06-20 NOTE — Telephone Encounter (Signed)
Called pt to follow up.  She tells me that she had recurrent diarrhea this AM.  Still some nausea, returned stool studies yesterday.  I advised her to follow up in office on Monday if no improvement in her symptoms, go to ED if symptoms worsen over the weekend.  She verbalizes understanding.

## 2011-06-21 ENCOUNTER — Emergency Department (INDEPENDENT_AMBULATORY_CARE_PROVIDER_SITE_OTHER): Payer: BC Managed Care – PPO

## 2011-06-21 ENCOUNTER — Emergency Department (HOSPITAL_BASED_OUTPATIENT_CLINIC_OR_DEPARTMENT_OTHER)
Admission: EM | Admit: 2011-06-21 | Discharge: 2011-06-21 | Disposition: A | Discharge status: Home / Self Care | Payer: BC Managed Care – PPO | Attending: Emergency Medicine | Admitting: Emergency Medicine

## 2011-06-21 ENCOUNTER — Encounter (HOSPITAL_BASED_OUTPATIENT_CLINIC_OR_DEPARTMENT_OTHER): Payer: Self-pay | Admitting: *Deleted

## 2011-06-21 DIAGNOSIS — R197 Diarrhea, unspecified: Secondary | ICD-10-CM

## 2011-06-21 DIAGNOSIS — E119 Type 2 diabetes mellitus without complications: Secondary | ICD-10-CM | POA: Insufficient documentation

## 2011-06-21 DIAGNOSIS — E785 Hyperlipidemia, unspecified: Secondary | ICD-10-CM | POA: Insufficient documentation

## 2011-06-21 DIAGNOSIS — R1084 Generalized abdominal pain: Secondary | ICD-10-CM | POA: Insufficient documentation

## 2011-06-21 DIAGNOSIS — I10 Essential (primary) hypertension: Secondary | ICD-10-CM | POA: Insufficient documentation

## 2011-06-21 DIAGNOSIS — R112 Nausea with vomiting, unspecified: Secondary | ICD-10-CM | POA: Insufficient documentation

## 2011-06-21 DIAGNOSIS — K7689 Other specified diseases of liver: Secondary | ICD-10-CM

## 2011-06-21 DIAGNOSIS — R109 Unspecified abdominal pain: Secondary | ICD-10-CM

## 2011-06-21 DIAGNOSIS — R1110 Vomiting, unspecified: Secondary | ICD-10-CM

## 2011-06-21 LAB — URINALYSIS, ROUTINE W REFLEX MICROSCOPIC
Ketones, ur: NEGATIVE mg/dL
Nitrite: NEGATIVE
Specific Gravity, Urine: 1.023 (ref 1.005–1.030)
pH: 5.5 (ref 5.0–8.0)

## 2011-06-21 LAB — COMPREHENSIVE METABOLIC PANEL
AST: 33 U/L (ref 0–37)
Albumin: 3.9 g/dL (ref 3.5–5.2)
Chloride: 105 mEq/L (ref 96–112)
Creatinine, Ser: 0.6 mg/dL (ref 0.50–1.10)
Potassium: 3.8 mEq/L (ref 3.5–5.1)
Sodium: 140 mEq/L (ref 135–145)
Total Bilirubin: 0.3 mg/dL (ref 0.3–1.2)

## 2011-06-21 LAB — URINE MICROSCOPIC-ADD ON

## 2011-06-21 LAB — CBC
Platelets: 265 10*3/uL (ref 150–400)
RBC: 4.29 MIL/uL (ref 3.87–5.11)
RDW: 12.6 % (ref 11.5–15.5)
WBC: 7.4 10*3/uL (ref 4.0–10.5)

## 2011-06-21 LAB — DIFFERENTIAL
Basophils Absolute: 0 10*3/uL (ref 0.0–0.1)
Lymphocytes Relative: 32 % (ref 12–46)
Neutro Abs: 4.3 10*3/uL (ref 1.7–7.7)
Neutrophils Relative %: 58 % (ref 43–77)

## 2011-06-21 MED ORDER — SODIUM CHLORIDE 0.9 % IV SOLN
999.0000 mL | INTRAVENOUS | Status: DC
  Administered 2011-06-21: 999 mL via INTRAVENOUS

## 2011-06-21 MED ORDER — ONDANSETRON HCL 4 MG/2ML IJ SOLN
4.0000 mg | Freq: Once | INTRAMUSCULAR | Status: AC
  Administered 2011-06-21: 4 mg via INTRAVENOUS
  Filled 2011-06-21: qty 2

## 2011-06-21 MED ORDER — MORPHINE SULFATE 4 MG/ML IJ SOLN
4.0000 mg | Freq: Once | INTRAMUSCULAR | Status: AC
  Administered 2011-06-21: 4 mg via INTRAVENOUS
  Filled 2011-06-21: qty 1

## 2011-06-21 MED ORDER — IOHEXOL 300 MG/ML  SOLN
100.0000 mL | Freq: Once | INTRAMUSCULAR | Status: AC | PRN
  Administered 2011-06-21: 100 mL via INTRAVENOUS

## 2011-06-21 MED ORDER — FAMOTIDINE IN NACL 20-0.9 MG/50ML-% IV SOLN
20.0000 mg | Freq: Once | INTRAVENOUS | Status: AC
  Administered 2011-06-21: 20 mg via INTRAVENOUS
  Filled 2011-06-21: qty 50

## 2011-06-21 MED ORDER — HYDROCODONE-ACETAMINOPHEN 5-325 MG PO TABS: 1.0000 | ORAL_TABLET | ORAL | Status: AC | PRN

## 2011-06-21 MED ORDER — ONDANSETRON HCL 4 MG PO TABS: 4.0000 mg | ORAL_TABLET | Freq: Four times a day (QID) | ORAL | Status: AC

## 2011-06-21 NOTE — ED Notes (Signed)
Assisted to bathroom. Pt tolerated well.

## 2011-06-21 NOTE — ED Notes (Signed)
Pt presents to ED today with c/o n/v/d/ since tues.  Pt was seen by PMD and given rx for flagyl.

## 2011-06-21 NOTE — ED Notes (Signed)
Requesting something to drink. Dr. Golda Acre advised. OK.

## 2011-06-21 NOTE — ED Notes (Signed)
Pt states she is starting to have pain and nausea again. Dr. Golda Acre is with her and aware.

## 2011-06-21 NOTE — ED Notes (Signed)
Pt presents with n/v/d since tues.  Pt c/o mid abd pain that is non-radiating.  Pt was seen by PMD and given rx for flagyl.  Pt has taken no OTC meds pta.

## 2011-06-21 NOTE — ED Notes (Signed)
MD at bedside. Dr. Golda Acre going over results with pt.

## 2011-06-21 NOTE — ED Provider Notes (Signed)
History     CSN: [id number] Arrival date & time: 06/21/2011 11:21 AM   First MD Initiated Contact with Patient 06/21/11 1137      Chief Complaint  Patient presents with  . Emesis  . Diarrhea    (Consider location/radiation/quality/duration/timing/severity/associated sxs/prior treatment) HPI Comments: Patient presents complaining of crampy intermittent abdominal pain since Tuesday.  This is been associated with vomiting and diarrhea.  Her emesis is not coffee ground or bloody and her stools are not melanic or bloody either.  She has no specific fevers.  Her pain is primarily in her upper abdomen but is somewhat crampy in nature.  Patient complains more of nausea and she does diarrhea.  She has been able to tolerate some by mouth intake though.  Patient did see her primary care physician on Wednesday and was given a prescription for Flagyl which she's been taking since that time and determine stool cultures on Thursday.  Patient was told by her primary care physician if she's not improved by the weekend she should come in for further evaluation which is why she's here today.  Patient denies any lightheadedness or dizziness and has no cough or chest pain or shortness of breath.  Patient's only abdominal surgery was for a gastric ulcer many years ago.  Patient is a 60 y.o. female presenting with abdominal pain. The history is provided by the patient. No language interpreter was used.  Abdominal Pain The primary symptoms of the illness include abdominal pain, nausea, vomiting and diarrhea. The primary symptoms of the illness do not include fever, shortness of breath, dysuria or vaginal discharge. The current episode started more than 2 days ago. The onset of the illness was gradual. The problem has not changed since onset. The patient states that she believes she is currently not pregnant. The patient has had a change in bowel habit. Symptoms associated with the illness do not include chills,  anorexia, diaphoresis, heartburn, constipation, urgency, hematuria, frequency or back pain.    Past Medical History  Diagnosis Date  . Hypertension   . Hyperlipemia   . Dyspepsia   . OSA (obstructive sleep apnea)   . Plantar fasciitis   . Depression   . Asthma     02/08/10 FEV1 1.98 (80%), s/p saba 2.22 l/m (90%).nml  . Anxiety   . 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 Acute peptic ulcer of stomach     As a teenager  . Fatty liver 12/19/2010  . Hemorrhoids   . Diverticulosis     Past Surgical History  Procedure Date  . Knee arthroscopy 1998    left knee  . Toe surgery 1995    right  . Nasal septum surgery   . Rhinoplasty   . Appendectomy     age 60    Family History  Problem Relation Age of Onset  . Diabetes Mother     type II  . Heart disease Mother 37  . Colon cancer Neg Hx     History  Substance Use Topics  . Smoking status: Never Smoker   . Smokeless tobacco: Never Used  . Alcohol Use: No    OB History    Grav Para Term Preterm Abortions TAB SAB Ect Mult Living   4 4             Obstetric Comments   1 child died at 6 weeks (heart defects, perforated bowel)      Review of Systems  Constitutional: Negative.  Negative for fever, chills and diaphoresis.  HENT: Negative.   Eyes: Negative.  Negative for discharge and redness.  Respiratory: Negative.  Negative for cough and shortness of breath.   Cardiovascular: Negative.  Negative for chest pain.  Gastrointestinal: Positive for nausea, vomiting, abdominal pain and diarrhea. Negative for heartburn, constipation and anorexia.  Genitourinary: Negative.  Negative for dysuria, urgency, frequency, hematuria and vaginal discharge.  Musculoskeletal: Negative.  Negative for back pain.  Skin: Negative.  Negative for color change and rash.  Neurological: Negative.  Negative for syncope and headaches.  Hematological: Negative.  Negative for adenopathy.  Psychiatric/Behavioral: Negative.  Negative for confusion.    All other systems reviewed and are negative.    Allergies  Prochlorperazine edisylate and Tetanus toxoid  Home Medications   Current Outpatient Rx  Name Route Sig Dispense Refill  . ALBUTEROL SULFATE HFA 108 (90 BASE) MCG/ACT IN AERS Inhalation Inhale 2 puffs into the lungs every 4 (four) hours as needed. Shortness of breath and wheezing    . FLUTICASONE FUROATE 27.5 MCG/SPRAY NA SUSP Nasal 1 spray by Nasal route 2 (two) times daily.      Marland Kitchen FLUTICASONE-SALMETEROL 250-50 MCG/DOSE IN AEPB Inhalation Inhale 1 puff into the lungs 2 (two) times daily. 60 each 3  . HYDROCODONE-ACETAMINOPHEN 5-325 MG PO TABS Oral Take 1 tablet by mouth every 4 (four) hours as needed for pain. 10 tablet 0  . LISINOPRIL-HYDROCHLOROTHIAZIDE 20-12.5 MG PO TABS Oral Take 1 tablet by mouth daily. 90 tablet 1  . LORATADINE 10 MG PO TABS  Take 2 tablets by mouth daily.     Marland Kitchen METFORMIN HCL 500 MG PO TABS Oral Take 1 tablet (500 mg total) by mouth 3 (three) times daily with meals. 90 tablet 6  . METRONIDAZOLE 500 MG PO TABS Oral Take 1 tablet (500 mg total) by mouth 3 (three) times daily. 21 tablet 0  . CENTRUM SILVER PO Oral Take 1 tablet by mouth daily.      Marland Kitchen ONDANSETRON HCL 4 MG PO TABS Oral Take 1 tablet (4 mg total) by mouth every 6 (six) hours. 12 tablet 0  . ALIGN 4 MG PO CAPS Oral Take 1 capsule by mouth daily.      Marland Kitchen RANITIDINE HCL 150 MG PO CAPS Oral Take 1 capsule (150 mg total) by mouth 2 (two) times daily. 180 capsule 1  . SERTRALINE HCL 50 MG PO TABS Oral Take 1 tablet (50 mg total) by mouth daily. 90 tablet 1  . SIMVASTATIN 20 MG PO TABS Oral Take 1 tablet (20 mg total) by mouth at bedtime. 90 tablet 0  . TRIAMCINOLONE ACETONIDE 0.1 % EX CREA Topical Apply topically 3 (three) times daily. Apply to affected skin 30 g 0    BP 120/77  Pulse 78  Temp(Src) 97.6 F (36.4 C) (Oral)  Resp 20  SpO2 97%  Physical Exam  Constitutional: She is oriented to person, place, and time. She appears well-developed  and well-nourished.  Non-toxic appearance. She does not have a sickly appearance.  HENT:  Head: Normocephalic and atraumatic.  Eyes: Conjunctivae, EOM and lids are normal. Pupils are equal, round, and reactive to light. No scleral icterus.  Neck: Trachea normal and normal range of motion. Neck supple.  Cardiovascular: Normal rate, regular rhythm and normal heart sounds.   Pulmonary/Chest: Effort normal and breath sounds normal.  Abdominal: Soft. Normal appearance. There is no tenderness. There is no rebound, no guarding and no CVA tenderness.  Musculoskeletal: Normal range of  motion.  Neurological: She is alert and oriented to person, place, and time. She has normal strength.  Skin: Skin is warm, dry and intact. No rash noted.  Psychiatric: She has a normal mood and affect. Her behavior is normal. Judgment and thought content normal.    ED Course  Procedures (including critical care time)  Labs Reviewed  URINALYSIS, ROUTINE W REFLEX MICROSCOPIC - Abnormal; Notable for the following:    Glucose, UA 100 (*)    Leukocytes, UA TRACE (*)    All other components within normal limits  COMPREHENSIVE METABOLIC PANEL - Abnormal; Notable for the following:    Glucose, Bld 187 (*)    ALT 48 (*)    All other components within normal limits  URINE MICROSCOPIC-ADD ON - Abnormal; Notable for the following:    Bacteria, UA FEW (*)    All other components within normal limits  CBC  DIFFERENTIAL  LIPASE, BLOOD   Ct Abdomen Pelvis W Contrast  06/21/2011  *RADIOLOGY REPORT*  Clinical Data: Abdominal pain, vomiting, diarrhea  CT ABDOMEN AND PELVIS WITH CONTRAST  Technique:  Multidetector CT imaging of the abdomen and pelvis was performed following the standard protocol during bolus administration of intravenous contrast.  Contrast: OMNIPAQUE IOHEXOL 300 MG/ML IV SOLN  Comparison: 12/17/2010  Findings: Lung bases are essentially clear.  Contrast in the distal esophagus at the time of imaging,  suggesting gastroesophageal reflux or esophageal dysmotility.  Surgical clips at the GE junction.  Hepatic steatosis.  Spleen, pancreas, and adrenal glands within normal limits.  Gallbladder is contracted.  No intrahepatic or extrahepatic ductal dilatation.  Left upper pole renal cyst.  Right kidney is unremarkable.  No hydronephrosis.  No evidence of bowel obstruction.  Colonic diverticulosis, without associated inflammatory changes.  No evidence of abdominal aortic aneurysm.  No abdominopelvic ascites.  No suspicious abdominopelvic lymphadenopathy.  Uterus and bilateral ovaries are unremarkable.  Bladder is within normal limits.  Tiny fat-containing umbilical hernia.  Mild degenerative changes of the visualized thoracolumbar spine.  IMPRESSION: No evidence of bowel obstruction.  Uncomplicated diverticulosis, without associated inflammatory changes.  Hepatic steatosis.  Contrast in the distal esophagus at the time of imaging, suggesting gastroesophageal reflux or esophageal dysmotility.  Original Report Authenticated By: Charline Bills, M.D.     1. Abdominal pain   2. Nausea and vomiting   3. Diarrhea       MDM  Patient with unclear etiology for her intermittent abdominal pain with vomiting diarrhea.  She has no acute signs of diverticulitis or colitis on her CAT scan.  Patient has normal laboratory studies with no elevated white blood cell count at this time either.  She has no gallstones in her gallbladder and a CAT scan to suggest cholecystitis worsened medical or symptomatic cholelithiasis nor is this history consistent with that.  Patient did have stool cultures sent by her primary care physician which are C. difficile negative but are otherwise still pending.  Patient does not have an acute abdomen on exam here today.  There no signs of obstruction on her CT scan.  Patient has been able to tolerate some by mouth intake at home and we are able to control her pain and nausea here.  I feel the  patient can be discharged home with close followup with her primary care physician on Monday for reevaluation.  Patient also understands to return for worsening pain, fevers, vomiting.        Nat Christen, MD 06/21/11 [phone]

## 2011-06-22 ENCOUNTER — Telehealth: Payer: Self-pay | Admitting: Family

## 2011-06-22 DIAGNOSIS — R197 Diarrhea, unspecified: Secondary | ICD-10-CM

## 2011-06-22 NOTE — Telephone Encounter (Addendum)
Pls call pt to see how she is feeling and let her know that her stool studies and blood work look good.  She can stop the metronidazole. If her diarrhea has not improved, I would like for her to see GI.

## 2011-06-23 ENCOUNTER — Encounter: Payer: Self-pay | Admitting: *Deleted

## 2011-06-23 LAB — STOOL CULTURE

## 2011-06-23 NOTE — Telephone Encounter (Signed)
Opened in error

## 2011-06-23 NOTE — Telephone Encounter (Signed)
Pt notified. States diarrhea persists. Wanted to let us know that her stools are yellow. Pt is agreeable to proceed with GI referral but will be out of town 12/20 & 12/21.

## 2011-06-23 NOTE — Telephone Encounter (Signed)
Left message on machine to return my call. 

## 2011-07-11 ENCOUNTER — Encounter: Payer: Self-pay | Admitting: Internal Medicine

## 2011-07-14 ENCOUNTER — Ambulatory Visit: Payer: BC Managed Care – PPO | Admitting: Internal Medicine

## 2011-07-22 ENCOUNTER — Ambulatory Visit (INDEPENDENT_AMBULATORY_CARE_PROVIDER_SITE_OTHER): Payer: BC Managed Care – PPO | Admitting: Family

## 2011-07-22 ENCOUNTER — Encounter: Payer: Self-pay | Admitting: Family

## 2011-07-22 VITALS — BP 114/80 | HR 78 | Temp 97.8°F | Resp 16 | Wt 166.0 lb

## 2011-07-22 DIAGNOSIS — A6000 Herpesviral infection of urogenital system, unspecified: Secondary | ICD-10-CM

## 2011-07-22 DIAGNOSIS — K219 Gastro-esophageal reflux disease without esophagitis: Secondary | ICD-10-CM

## 2011-07-22 DIAGNOSIS — E119 Type 2 diabetes mellitus without complications: Secondary | ICD-10-CM

## 2011-07-22 MED ORDER — RANITIDINE HCL 150 MG PO CAPS: 150.0000 mg | ORAL_CAPSULE | Freq: Two times a day (BID) | ORAL | Status: DC

## 2011-07-22 MED ORDER — VALACYCLOVIR HCL 500 MG PO TABS: ORAL_TABLET | ORAL | Status: DC

## 2011-07-22 NOTE — Patient Instructions (Addendum)
Take valtrex twice daily for 3 days at start of herpes.   Follow up in 3 months.

## 2011-07-22 NOTE — Progress Notes (Signed)
Addended by: Sandford Craze on: 07/22/2011 05:00 PM   Modules accepted: Orders

## 2011-07-22 NOTE — Assessment & Plan Note (Signed)
Pt is due for A1C. Obtain.

## 2011-07-22 NOTE — Assessment & Plan Note (Signed)
HSV outbreak.  I think this is the cause of her rectal pain not the hemorrhoid.  Treat with valtrex bid x 3 days.

## 2011-07-22 NOTE — Assessment & Plan Note (Signed)
Refill provided for zantac.

## 2011-07-22 NOTE — Progress Notes (Signed)
Subjective:    Patient ID: Crystal [name], female    DOB: 1950/11/30, 61 y.o.   MRN: [id number]  HPI  [name] is a 61 yr old female who presents today with chief complaint of rectal pain. She reports that she first noticed the discomfort about 1 week ago. Symptoms worsened about 2 days ago.  She describes the pain as severe.  Previous hx of similar symptoms in the past with herpes outbreak.     Review of Systems See HPI  Past Medical History  Diagnosis Date  . Hypertension   . Hyperlipemia   . Dyspepsia   . OSA (obstructive sleep apnea)   . Plantar fasciitis   . Depression   . Asthma     02/08/10 FEV1 1.98 (80%), s/p saba 2.22 l/m (90%).nml  . Anxiety   . 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 Acute peptic ulcer of stomach     As a teenager  . Fatty liver 12/19/2010  . Hemorrhoids   . Diverticulosis     History   Social History  . Marital Status: Married    Spouse Name: [name]    Number of Children: 4  . 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 Smoking status: Never Smoker   . Smokeless tobacco: Never Used  . Alcohol Use: No  . Drug Use: No  . Sexually Active: Not on file   Other Topics Concern  . Not on file   Social History Narrative   No caffeine drinks daily     Past Surgical History  Procedure Date  . Knee arthroscopy 1998    left knee  . Toe surgery 1995    right  . Nasal septum surgery   . Rhinoplasty   . Appendectomy     age 77  . Colonoscopy 06/03/2010    diverticulosis, internal and external hemorrhoids    Family History  Problem Relation Age of Onset  . Diabetes Mother     type II  . Heart attack Mother 54  . Colon cancer Neg Hx   . Ulcers      bleeding gastric    Allergies  Allergen Reactions  . Prochlorperazine Edisylate     REACTION: coma for 3 days  . Tetanus Toxoid     REACTION: unknown    Current Outpatient Prescriptions on File Prior to Visit    Medication Sig Dispense Refill  . albuterol (PROAIR HFA) 108 (90 BASE) MCG/ACT inhaler Inhale 2 puffs into the lungs every 4 (four) hours as needed. Shortness of breath and wheezing      . fluticasone (VERAMYST) 27.5 MCG/SPRAY nasal spray 1 spray by Nasal route 2 (two) times daily.        . Fluticasone-Salmeterol (ADVAIR DISKUS) 250-50 MCG/DOSE AEPB Inhale 1 puff into the lungs 2 (two) times daily.  60 each  3  . lisinopril-hydrochlorothiazide (PRINZIDE,ZESTORETIC) 20-12.5 MG per tablet Take 1 tablet by mouth daily.  90 tablet  1  . loratadine (CLARITIN) 10 MG tablet Take 2 tablets by mouth daily.       . metFORMIN (GLUCOPHAGE) 500 MG tablet Take 1 tablet (500 mg total) by mouth 3 (three) times daily with meals.  90 tablet  6  . Multiple Vitamins-Minerals (CENTRUM SILVER PO) Take 1 tablet by mouth daily.        . Probiotic Product (ALIGN) 4 MG CAPS Take 1 capsule by  mouth daily.        . ranitidine (ZANTAC) 150 MG capsule Take 1 capsule (150 mg total) by mouth 2 (two) times daily.  180 capsule  1  . sertraline (ZOLOFT) 50 MG tablet Take 1 tablet (50 mg total) by mouth daily.  90 tablet  1  . simvastatin (ZOCOR) 20 MG tablet Take 1 tablet (20 mg total) by mouth at bedtime.  90 tablet  0  . triamcinolone (KENALOG) 0.1 % cream Apply topically 3 (three) times daily. Apply to affected skin  30 g  0    BP 114/80  Pulse 78  Temp(Src) 97.8 F (36.6 C) (Oral)  Resp 16  Wt 166 lb (75.297 kg)       Objective:   Physical Exam  Constitutional: She appears well-developed and well-nourished.  Cardiovascular: Normal rate and regular rhythm.   No murmur heard. Pulmonary/Chest: Effort normal and breath sounds normal. No respiratory distress. She has no wheezes. She has no rales. She exhibits no tenderness.  Genitourinary:          Small patch of vessicles near rectum.  + external hemorrhoid with some shallow ulcerated overlying lesions noted.   Psychiatric: She has a normal mood and affect. Her  behavior is normal. Judgment and thought content normal.          Assessment & Plan:

## 2011-07-23 ENCOUNTER — Telehealth: Payer: Self-pay | Admitting: *Deleted

## 2011-07-23 NOTE — Telephone Encounter (Signed)
Left detailed message on voicemail and to call if any questions. 

## 2011-07-23 NOTE — Telephone Encounter (Signed)
Received message from pt that she is needing a refill on her diarrhea/stomach medication. Left message for pt to call with name of med that she is needing refilled as Zantac is the only stomach medication on her med list.

## 2011-07-23 NOTE — Telephone Encounter (Signed)
Received message from pt's husband that the name of the med pt is requesting is Ondansetron 4mg . Spoke with pt's husband and advised him that this is usually taken for nausea. He states that it also helped with her diarrhea in the past.  Please advise.

## 2011-07-23 NOTE — Telephone Encounter (Signed)
I would recommend imodium prn for occasional diarrhea.  Zofran is not indicated for diarrhea.

## 2011-07-24 ENCOUNTER — Encounter: Payer: Self-pay | Admitting: Family

## 2011-07-31 ENCOUNTER — Telehealth: Payer: Self-pay | Admitting: Family

## 2011-07-31 NOTE — Telephone Encounter (Signed)
Spoke with pharmacist and verified that pt is requesting refill of Zofran. Attempted to reach pt and left message on voicemail to call with reason that zofran is needed at this time. Per previous phone note, Melissa did not recommend this for her diarrhea.

## 2011-07-31 NOTE — Telephone Encounter (Signed)
Attempted to reach pharmacy to verify name of med needing to be refilled. No answer at pharmacy, will try back later.

## 2011-07-31 NOTE — Telephone Encounter (Signed)
Spoke with pt, she is requesting med for diarrhea. Advised pt to try Immodium per previous phone note. Zofran request denied and message left on pharmacy voicemail. Pt voices understanding.

## 2011-08-05 ENCOUNTER — Encounter: Payer: Self-pay | Admitting: Internal Medicine

## 2011-08-05 ENCOUNTER — Ambulatory Visit (INDEPENDENT_AMBULATORY_CARE_PROVIDER_SITE_OTHER): Payer: BC Managed Care – PPO | Admitting: Internal Medicine

## 2011-08-05 ENCOUNTER — Telehealth: Payer: Self-pay | Admitting: Internal Medicine

## 2011-08-05 VITALS — BP 122/68 | HR 80 | Ht 63.0 in | Wt 165.0 lb

## 2011-08-05 DIAGNOSIS — R197 Diarrhea, unspecified: Secondary | ICD-10-CM

## 2011-08-05 DIAGNOSIS — K589 Irritable bowel syndrome without diarrhea: Secondary | ICD-10-CM | POA: Insufficient documentation

## 2011-08-05 NOTE — Telephone Encounter (Signed)
Patient called back and questioned if she needed to stay on the gluten free diet. Per Dr. Leone Payor, patient was told no.

## 2011-08-05 NOTE — Assessment & Plan Note (Signed)
Cause of this is not clear. Multiple stool studies, low fiber diet, 2 rounds of metronidazole have not helped. I wonder if it's not metformin. She's been on a partial gluten-free diet. Colonoscopy in 2011 showed normal mucosa the random biopsies were not taken, she was not having diarrhea at that time. Does not seem to be sugar-free candy. I doubt milk and or lactose intolerance. She is worried about her husband's melanoma and I do think IBS is certainly possible.  Current plan is for her to hold metformin for 1 week and call was back. I told her to let us know if her blood sugars get over 200. If that is unsuccessful, we'll need to consider celiac testing, also possibility of colonoscopy and biopsy.

## 2011-08-05 NOTE — Patient Instructions (Signed)
Please HOLD your Metformin for 1 week. Please keep a log of you diarrhea and update on your condition and give [place] a call back to inform us.

## 2011-08-05 NOTE — Progress Notes (Signed)
  Subjective:    Patient ID: Crystal [name], female    DOB: 04-24-1951, 61 y.o.   MRN: [id number]  HPILast seen in June - metrondiazole, dicyclomine and low fiber diet for diarrhea. Did not seem to help. Stool pattern is loose and yellow, after every meal but not nocturnal. It is urgent ad near incontinence. Able to go to work every day. Gassy also. Gets some pre-defecatory cramps. Up to 12 stools in a day. Uses sugar-free candy but not every. She does not think milk bothers her. She tried gluten-free (as much as possible) and it did not make a difference. No diet makes a difference. Is using loperamide after diarrhea and it holds it up til she eats Recent exacerbation with nausea, vomiting and diarrhea - stool Cx O and P and C diff negative. CT ab/pelvis negative. Took metronidazole in December also. Sometimes does have normal stools "once in a while". Some epigastric pain at times - when she eats fatty or spicy foods. Says weight is up and down. She thinks holding metformin may have improved the diarrhea - she held it due to cost several months ago? Does not really remember when.  She does not recall if dicyclomine Rx (6/12) made a difference. Rare rectal bleeding with wiping after numerous stools.  Wt Readings from Last 3 Encounters:  08/05/11 165 lb (74.844 kg)  07/22/11 166 lb (75.297 kg)  06/18/11 165 lb 1.3 oz (74.88 kg)    Review of Systems No fevers  worrried about her husband who has stage IV melanoma Objective:   Physical Exam General:  NAD Eyes:   anicteric Lungs:  clear Heart:  S1S2 no rubs, murmurs or gallops Abdomen:  soft and mildly diffusely tender, BS+, midline scar Ext:   no edema    Data Reviewed:  See HPI       Assessment & Plan:

## 2011-08-11 ENCOUNTER — Telehealth: Payer: Self-pay | Admitting: Internal Medicine

## 2011-08-11 NOTE — Telephone Encounter (Signed)
Patient reports that she a good week with formed stool.  She reports however, that she had loose yellow stools today.  She is still holding her Metformin.  Please advise

## 2011-08-12 NOTE — Telephone Encounter (Signed)
Patient advised via voicemail. I have asked her to call back with an update in 1 week

## 2011-08-12 NOTE — Telephone Encounter (Signed)
One day of loose stools inconclusive. Have her go another week off the metformin and report back.

## 2011-08-18 ENCOUNTER — Telehealth: Payer: Self-pay | Admitting: Internal Medicine

## 2011-08-18 DIAGNOSIS — R197 Diarrhea, unspecified: Secondary | ICD-10-CM

## 2011-08-18 NOTE — Telephone Encounter (Signed)
Patient's husband advised of Dr Marvell Fuller recommendations.  She will come for labs one day this week

## 2011-08-18 NOTE — Telephone Encounter (Signed)
Ok - does not sound like it is the metformin causing it so resume it as before.  Have her do TTG antibody and IgA level to evaluate for possible celiac disease.  If that is negative then colonoscopy and random biopsies will be needed.

## 2011-08-18 NOTE — Telephone Encounter (Signed)
Patient reports that she is having loose stools one day then no BM the next.  She reports that her stools are loose and watery when she does have a BM, she also reports abdominal cramping.  Her blood sugars are now staying between 170-290 off her metformin.  Dr Leone Payor please advise

## 2011-08-18 NOTE — Telephone Encounter (Signed)
See other telephone call from today for Dr Marvell Fuller recommendations

## 2011-08-19 ENCOUNTER — Other Ambulatory Visit (INDEPENDENT_AMBULATORY_CARE_PROVIDER_SITE_OTHER): Payer: BC Managed Care – PPO

## 2011-08-19 DIAGNOSIS — R197 Diarrhea, unspecified: Secondary | ICD-10-CM

## 2011-08-20 LAB — TISSUE TRANSGLUTAMINASE, IGA: Tissue Transglutaminase Ab, IgA: 3.5 U/mL (ref <20)

## 2011-08-21 ENCOUNTER — Ambulatory Visit (AMBULATORY_SURGERY_CENTER): Payer: BC Managed Care – PPO | Admitting: *Deleted

## 2011-08-21 VITALS — Ht 63.5 in | Wt 165.0 lb

## 2011-08-21 DIAGNOSIS — R197 Diarrhea, unspecified: Secondary | ICD-10-CM

## 2011-08-21 MED ORDER — PEG-KCL-NACL-NASULF-NA ASC-C 100 G PO SOLR: ORAL | Status: DC

## 2011-08-21 NOTE — Progress Notes (Signed)
Quick Note:  Celiac testing is negative I recommend a colonoscopy with random biopsies to evaluate chronic diarrhea ______

## 2011-08-25 ENCOUNTER — Ambulatory Visit (AMBULATORY_SURGERY_CENTER): Payer: BC Managed Care – PPO | Admitting: Internal Medicine

## 2011-08-25 ENCOUNTER — Encounter: Payer: Self-pay | Admitting: Internal Medicine

## 2011-08-25 VITALS — BP 121/82 | HR 85 | Temp 97.1°F | Resp 24 | Ht 63.0 in | Wt 165.0 lb

## 2011-08-25 DIAGNOSIS — K648 Other hemorrhoids: Secondary | ICD-10-CM

## 2011-08-25 DIAGNOSIS — D126 Benign neoplasm of colon, unspecified: Secondary | ICD-10-CM

## 2011-08-25 DIAGNOSIS — K5730 Diverticulosis of large intestine without perforation or abscess without bleeding: Secondary | ICD-10-CM

## 2011-08-25 DIAGNOSIS — R197 Diarrhea, unspecified: Secondary | ICD-10-CM

## 2011-08-25 MED ORDER — SODIUM CHLORIDE 0.9 % IV SOLN: 500.0000 mL | INTRAVENOUS | Status: DC

## 2011-08-25 NOTE — Patient Instructions (Addendum)
I took biopsies of the colon to see if there is microscopic inflammation causing your diarrhea. The colon lining looked ok today. We will call with the results and plans. Its ok to use Imodium AD if you need to. Iva Boop, MD, [facility] FOLLOW DISCHARGE INSTRUCTIONS [name] AND GREEN SHEETS).Marland Kitchen

## 2011-08-25 NOTE — Op Note (Signed)
[facility] [address] [place], [place]  [address]  COLONOSCOPY PROCEDURE REPORT  PATIENT:  Crystal, [name]  MR#:  [id number] BIRTHDATE:  1950-10-14, 60 yrs. old  GENDER:  female ENDOSCOPIST:  Iva Boop, MD, [facility]  PROCEDURE DATE:  08/25/2011 PROCEDURE:  Colonoscopy with biopsy ASA CLASS:  Class II INDICATIONS:  unexplained diarrhea MEDICATIONS:   These medications were titrated to patient response per physician's verbal order, Fentanyl 50 mcg IV, Versed 4 mg IV  DESCRIPTION OF PROCEDURE:   After the risks benefits and alternatives of the procedure were thoroughly explained, informed consent was obtained.  Digital rectal exam was performed and revealed no abnormalities.   The [id number] endoscope was introduced through the anus and advanced to the terminal ileum which was intubated for a short distance, without limitations. The quality of the prep was excellent, using MoviPrep.  The instrument was then slowly withdrawn as the colon was fully examined. <<PROCEDUREIMAGES>>  FINDINGS:  The terminal ileum appeared normal.  Mild diverticulosis was found in the sigmoid colon.  This was otherwise a normal examination of the colon. Random biopsies were obtained and sent to pathology.   Retroflexed views in the rectum revealed internal hemorrhoids.    The time to cecum = 1:40 minutes. The scope was then withdrawn in 10:40 minutes from the cecum and the procedure completed. COMPLICATIONS:  None ENDOSCOPIC IMPRESSION: 1) Normal terminal ileum 2) Mild diverticulosis in the sigmoid colon 3) Internal hemorrhoids 4) Otherwise normal examination, excellent prep RECOMMENDATIONS: await biopsy results - will call  Iva Boop, MD, [facility]  CC:  The Patient  n. eSIGNED:   Iva Boop at 08/25/2011 04:21 PM  Jeronimo Norma, [phone]

## 2011-08-25 NOTE — Progress Notes (Signed)
Patient did not experience any of the following events: a burn prior to discharge; a fall within the facility; wrong site/side/patient/procedure/implant event; or a hospital transfer or hospital admission upon discharge from the facility. (G8907) Patient did not have preoperative order for IV antibiotic SSI prophylaxis. (G8918)  

## 2011-08-26 ENCOUNTER — Telehealth: Payer: Self-pay | Admitting: *Deleted

## 2011-08-26 NOTE — Telephone Encounter (Signed)
  Follow up Call-  Call back number 08/25/2011  Post procedure Call Back phone  # [phone]  Phone comments may reach at [phone]  Permission to leave phone message Yes     Patient questions:  Do you have a fever, pain , or abdominal swelling? no Pain Score  0 *  Have you tolerated food without any problems? yes  Have you been able to return to your normal activities? yes  Do you have any questions about your discharge instructions: Diet   no Medications  no Follow up visit  no  Do you have questions or concerns about your Care? no  Actions: * If pain score is 4 or above: No action needed, pain <4.

## 2011-09-03 ENCOUNTER — Other Ambulatory Visit: Payer: Self-pay

## 2011-09-03 DIAGNOSIS — R197 Diarrhea, unspecified: Secondary | ICD-10-CM

## 2011-09-03 NOTE — Progress Notes (Signed)
Quick Note:  Office:  Call patient - random bxs were normal - no colitis  ? IBS vs. Possible inadequate pancreas function  Check fecal elastase and also set up appointment for 2-3 weeks - if that is ok will offer Lotronex - you can send her info Stay on Immodium for now - if not effective I can try Lomotil - let me know   LE:  No letter  PLACE 10 year colon recall ______

## 2011-09-04 ENCOUNTER — Other Ambulatory Visit: Payer: BC Managed Care – PPO

## 2011-09-04 DIAGNOSIS — R197 Diarrhea, unspecified: Secondary | ICD-10-CM

## 2011-09-10 ENCOUNTER — Telehealth: Payer: Self-pay | Admitting: *Deleted

## 2011-09-10 LAB — PANCREATIC ELASTASE, FECAL: Pancreatic Elastase-1, Stool: >500 mcg/g

## 2011-09-10 NOTE — Telephone Encounter (Signed)
Pt called stating she has been started on Lotronex for IBS by GI doctor. Wants to make sure this will not interfere with any of her other meds?  Please advise.

## 2011-09-10 NOTE — Telephone Encounter (Signed)
Notified pt. 

## 2011-09-10 NOTE — Telephone Encounter (Signed)
This should be fine to take with her other medications.

## 2011-09-11 ENCOUNTER — Ambulatory Visit (INDEPENDENT_AMBULATORY_CARE_PROVIDER_SITE_OTHER): Payer: BC Managed Care – PPO | Admitting: Internal Medicine

## 2011-09-11 ENCOUNTER — Encounter: Payer: Self-pay | Admitting: Internal Medicine

## 2011-09-11 VITALS — BP 100/72 | HR 108 | Temp 97.9°F | Resp 20

## 2011-09-11 DIAGNOSIS — J069 Acute upper respiratory infection, unspecified: Secondary | ICD-10-CM

## 2011-09-11 MED ORDER — DOXYCYCLINE HYCLATE 100 MG PO TABS: 100.0000 mg | ORAL_TABLET | Freq: Two times a day (BID) | ORAL | Status: DC

## 2011-09-11 MED ORDER — HYDROCOD POLST-CHLORPHEN POLST 10-8 MG/5ML PO LQCR: 5.0000 mL | Freq: Two times a day (BID) | ORAL | Status: DC | PRN

## 2011-09-11 NOTE — Progress Notes (Signed)
  Subjective:    Patient ID: Crystal [name], female    DOB: 1951/04/26, 61 y.o.   MRN: [id number]  HPI Pt presents to clinic for evaluation of cough. Notes 5 day h/o NP cough. Had lg fever yesterday of 99. Taking otc medication without improvement. Denies dyspnea, wheezing or hemoptysis. No other alleviating or exacerbating factors.  Past Medical History  Diagnosis Date  . Hypertension   . Hyperlipemia   . Dyspepsia   . OSA (obstructive sleep apnea)   . Plantar fasciitis   . Depression   . Asthma     02/08/10 FEV1 1.98 (80%), s/p saba 2.22 l/m (90%).nml  . Anxiety   . 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 Fatty liver 12/19/2010  . Hemorrhoids   . Diverticulosis   . Allergy     dust, cock roach,grass,weeds,molds  . GERD (gastroesophageal reflux disease)   . Acute peptic ulcer of stomach     As a teenager   Past Surgical History  Procedure Date  . Knee arthroscopy 1998    left knee  . Toe surgery 1995    right  . Nasal septum surgery   . Rhinoplasty   . Appendectomy     age 62  . Colonoscopy 06/03/2010    diverticulosis, internal and external hemorrhoids  . Gastrectomy     partial - ulcer as a teen    reports that she has never smoked. She has never used smokeless tobacco. She reports that she does not drink alcohol or use illicit drugs. family history includes Colon cancer in her sister; Diabetes in her mother; Heart attack (age of onset:50) in her mother; and Ulcers in an unspecified family member. Allergies  Allergen Reactions  . Prochlorperazine Edisylate     REACTION: coma for 3 days  . Tetanus Toxoid Other (See Comments)    REACTION: convulsions     Review of Systems see hpi    Objective:   Physical Exam  Nursing note and vitals reviewed. Constitutional: She appears well-developed and well-nourished. No distress.  HENT:  Head: Normocephalic and atraumatic.  Right Ear: External ear normal.  Left Ear: External ear normal.  Nose: Nose normal.    Mouth/Throat: Oropharynx is clear and moist. No oropharyngeal exudate.  Eyes: Conjunctivae are normal. No scleral icterus.  Neck: Neck supple.  Cardiovascular: Normal rate, regular rhythm and normal heart sounds.   Pulmonary/Chest: Effort normal and breath sounds normal. No respiratory distress. She has no wheezes. She has no rales.  Lymphadenopathy:    She has no cervical adenopathy.  Neurological: She is alert.  Skin: She is not diaphoretic.  Psychiatric: She has a normal mood and affect.          Assessment & Plan:

## 2011-09-11 NOTE — Assessment & Plan Note (Signed)
Attempt tussionex prn cough-cautioned re possible sedating effect. Hold abx-begin if sx's do not improve after total duration of 8-10 days. Followup if no improvement or worsening.

## 2011-09-12 ENCOUNTER — Telehealth: Payer: Self-pay | Admitting: Family

## 2011-09-12 MED ORDER — LISINOPRIL-HYDROCHLOROTHIAZIDE 20-12.5 MG PO TABS: 1.0000 | ORAL_TABLET | Freq: Every day | ORAL | Status: DC

## 2011-09-12 NOTE — Telephone Encounter (Signed)
Refill sent to pharmacy #90 x 1 refill. 

## 2011-09-12 NOTE — Telephone Encounter (Signed)
Previous Lisinopril Rx printed in error. Re-sent via eRx.

## 2011-09-12 NOTE — Telephone Encounter (Signed)
Addended by: Mervin Kung A on: 09/12/2011 10:19 AM   Modules accepted: Orders

## 2011-09-19 ENCOUNTER — Encounter: Payer: Self-pay | Admitting: Family

## 2011-09-19 ENCOUNTER — Ambulatory Visit (INDEPENDENT_AMBULATORY_CARE_PROVIDER_SITE_OTHER): Payer: BC Managed Care – PPO | Admitting: Family

## 2011-09-19 DIAGNOSIS — E785 Hyperlipidemia, unspecified: Secondary | ICD-10-CM

## 2011-09-19 DIAGNOSIS — E119 Type 2 diabetes mellitus without complications: Secondary | ICD-10-CM

## 2011-09-19 DIAGNOSIS — E039 Hypothyroidism, unspecified: Secondary | ICD-10-CM

## 2011-09-19 DIAGNOSIS — I10 Essential (primary) hypertension: Secondary | ICD-10-CM

## 2011-09-19 DIAGNOSIS — R6889 Other general symptoms and signs: Secondary | ICD-10-CM

## 2011-09-19 DIAGNOSIS — R252 Cramp and spasm: Secondary | ICD-10-CM

## 2011-09-19 DIAGNOSIS — T679XXA Effect of heat and light, unspecified, initial encounter: Secondary | ICD-10-CM

## 2011-09-19 LAB — LIPID PANEL
LDL Cholesterol: 126 mg/dL — ABNORMAL HIGH (ref 0–99)
Total CHOL/HDL Ratio: 4.8 Ratio
VLDL: 58 mg/dL — ABNORMAL HIGH (ref 0–40)

## 2011-09-19 MED ORDER — METFORMIN HCL 500 MG PO TABS: ORAL_TABLET | ORAL | Status: DC

## 2011-09-19 NOTE — Assessment & Plan Note (Signed)
Clinically stable on metformin.  Continue same.  

## 2011-09-19 NOTE — Progress Notes (Signed)
Subjective:    Patient ID: Crystal [name], female    DOB: February 19, 1951, 61 y.o.   MRN: [id number]  HPI  Crystal [name] is a 61 yr old female who presents today for follow up.    1) Dm2-  Reports post prandial about 150.  Fasting sugars about 125.  On metformin.  2) leg cramps- bilateral legs L>R.  Worse when she sits for a while and tries to get up.    3) heat intolerance- Notes that she sweats easily.  4) mild nasal congestion/cough- improving.   Review of Systems See HPI  Past Medical History  Diagnosis Date  . Hypertension   . Hyperlipemia   . Dyspepsia   . OSA (obstructive sleep apnea)   . Plantar fasciitis   . Depression   . Asthma     02/08/10 FEV1 1.98 (80%), s/p saba 2.22 l/m (90%).nml  . Anxiety   . 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 Fatty liver 12/19/2010  . Hemorrhoids   . Diverticulosis   . Allergy     dust, cock roach,grass,weeds,molds  . GERD (gastroesophageal reflux disease)   . Acute peptic ulcer of stomach     As a teenager    History   Social History  . Marital Status: Married    Spouse Name: [name]    Number of Children: 4  . 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 Smoking status: Never Smoker   . Smokeless tobacco: Never Used  . Alcohol Use: No  . Drug Use: No  . Sexually Active: Not on file   Other Topics Concern  . Not on file   Social History Narrative   No caffeine drinks daily     Past Surgical History  Procedure Date  . Knee arthroscopy 1998    left knee  . Toe surgery 1995    right  . Nasal septum surgery   . Rhinoplasty   . Appendectomy     age 61  . Colonoscopy 06/03/2010    diverticulosis, internal and external hemorrhoids  . Gastrectomy     partial - ulcer as a teen    Family History  Problem Relation Age of Onset  . Diabetes Mother     type II  . Heart attack Mother 18  . Ulcers      bleeding gastric  . Colon cancer Sister     Allergies    Allergen Reactions  . Prochlorperazine Edisylate     REACTION: coma for 3 days  . Tetanus Toxoid Other (See Comments)    REACTION: convulsions    Current Outpatient Prescriptions on File Prior to Visit  Medication Sig Dispense Refill  . albuterol (PROAIR HFA) 108 (90 BASE) MCG/ACT inhaler Inhale 2 puffs into the lungs every 4 (four) hours as needed. Shortness of breath and wheezing      . chlorpheniramine-HYDROcodone (TUSSIONEX PENNKINETIC ER) 10-8 MG/5ML LQCR Take 5 mLs by mouth every 12 (twelve) hours as needed.  140 mL  0  . fluticasone (VERAMYST) 27.5 MCG/SPRAY nasal spray 1 spray by Nasal route 2 (two) times daily.        . Fluticasone-Salmeterol (ADVAIR DISKUS) 250-50 MCG/DOSE AEPB Inhale 1 puff into the lungs 2 (two) times daily.  60 each  3  . lisinopril-hydrochlorothiazide (PRINZIDE,ZESTORETIC) 20-12.5 MG per tablet Take 1 tablet by mouth daily.  90 tablet  1  . Loperamide HCl (IMODIUM  PO) Take by mouth as needed.      . loratadine (CLARITIN) 10 MG tablet Take 2 tablets by mouth daily.       . Multiple Vitamins-Minerals (CENTRUM SILVER PO) Take 1 tablet by mouth daily.        . Probiotic Product (ALIGN) 4 MG CAPS Take 1 capsule by mouth daily.        . ranitidine (ZANTAC) 150 MG capsule Take 1 capsule (150 mg total) by mouth 2 (two) times daily.  180 capsule  1  . simvastatin (ZOCOR) 20 MG tablet Take 1 tablet (20 mg total) by mouth at bedtime.  90 tablet  0  . triamcinolone (KENALOG) 0.1 % cream Apply topically 3 (three) times daily. Apply to affected skin  30 g  0  . valACYclovir (VALTREX) 500 MG tablet Take as directed.  30 tablet  0    BP 128/80  Pulse 87  Temp(Src) 97.4 F (36.3 C) (Oral)  Resp 16  Ht 5' 3.5" (1.613 m)  Wt 164 lb (74.39 kg)  BMI 28.60 kg/m2  SpO2 96%       Objective:   Physical Exam  Constitutional: She appears well-developed and well-nourished. No distress.  Cardiovascular: Normal rate and regular rhythm.   No murmur heard. Pulmonary/Chest:  Effort normal and breath sounds normal. No respiratory distress. She has no wheezes. She has no rales. She exhibits no tenderness.  Musculoskeletal: She exhibits no edema.  Skin: Skin is warm and dry.  Psychiatric: She has a normal mood and affect. Her behavior is normal. Judgment and thought content normal.          Assessment & Plan:

## 2011-09-19 NOTE — Patient Instructions (Signed)
Please complete your lab work prior to leaving. Follow up in 3 months, sooner if problems/concerns.   

## 2011-09-20 LAB — BASIC METABOLIC PANEL
BUN: 12 mg/dL (ref 6–23)
Chloride: 102 mEq/L (ref 96–112)
Creat: 0.66 mg/dL (ref 0.50–1.10)
Glucose, Bld: 125 mg/dL — ABNORMAL HIGH (ref 70–99)
Potassium: 4.3 mEq/L (ref 3.5–5.3)

## 2011-09-21 ENCOUNTER — Telehealth: Payer: Self-pay | Admitting: Family

## 2011-09-21 MED ORDER — SIMVASTATIN 40 MG PO TABS: 40.0000 mg | ORAL_TABLET | Freq: Every day | ORAL | Status: DC

## 2011-09-21 NOTE — Telephone Encounter (Signed)
Pls call pt and let her know that her cholesterol is up a bit.  I would like for her to increase her simvastatin from 20 to 40mg .  Will will plan to repeat lipids in 3 mos.

## 2011-09-22 DIAGNOSIS — R252 Cramp and spasm: Secondary | ICD-10-CM | POA: Insufficient documentation

## 2011-09-22 DIAGNOSIS — R6889 Other general symptoms and signs: Secondary | ICD-10-CM | POA: Insufficient documentation

## 2011-09-22 NOTE — Telephone Encounter (Signed)
Pt notified and will complete additional bloodwork at f/u in June. Pt was advised to be fasting.

## 2011-09-22 NOTE — Assessment & Plan Note (Signed)
TSH obtained and is normal.  She is past menopause.  Monitor.

## 2011-09-22 NOTE — Telephone Encounter (Signed)
Left message on machine to return my call. 

## 2011-09-22 NOTE — Assessment & Plan Note (Signed)
Recommended that she try to add some stretching exercises such as yoga.  Obtain BMET to evaluate electrolytes.

## 2011-09-24 ENCOUNTER — Ambulatory Visit (INDEPENDENT_AMBULATORY_CARE_PROVIDER_SITE_OTHER): Payer: BC Managed Care – PPO | Admitting: Internal Medicine

## 2011-09-24 ENCOUNTER — Encounter: Payer: Self-pay | Admitting: Internal Medicine

## 2011-09-24 VITALS — BP 120/70 | HR 72 | Ht 63.0 in | Wt 167.0 lb

## 2011-09-24 DIAGNOSIS — K589 Irritable bowel syndrome without diarrhea: Secondary | ICD-10-CM

## 2011-09-24 MED ORDER — ALOSETRON HCL 0.5 MG PO TABS: 0.5000 mg | ORAL_TABLET | Freq: Two times a day (BID) | ORAL | Status: DC

## 2011-09-24 NOTE — Assessment & Plan Note (Signed)
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 Workup to date has been unrevealing so that's supports this diagnosis. Will try Lotronex 0.5 mg twice a day. It is a nonformulary medication for her but we gave her the discount card . We thoroughly reviewed the risks benefits and indications and she has red and signed the information sheet that explains adverse events and what to look out for. I have told her not to use Imodium at this time. She understands to hold medication and call if there are any changes, specifically mentioned abdominal pain, bleeding and severe constipation. I will see her back in about 6 weeks to reassess things.

## 2011-09-24 NOTE — Progress Notes (Signed)
  Subjective:    Patient ID: Crystal [name], female    DOB: [birth year], 61 y.o.   MRN: [id number]  HPI The patient returns with persistent IBS complaints. Extensive workup as outlined in the medical record and let the diagnosis of diarrhea predominant IBS. She continues with loose urgent defecation and makes it to the bathroom just in time most of the time. She has had some straining with stool recently. She does not have solid or formed stools. She is rarely using Imodium. He'll frustrated with her symptoms.  Medications, allergies, past medical and surgical history reviewed and updated in the electronic medical record today.  Review of Systems As above.    Objective:   Physical Exam Developed well-nourished no acute distress, overweight Abdomen is soft and nontender there is a lower midline scar bowel sounds are present She is alert and oriented x3 and has appropriate mood and affect       Assessment & Plan:

## 2011-09-24 NOTE — Patient Instructions (Signed)
We have given you a prescription for Lotronex and a discount card to take to your pharmacy.

## 2011-10-02 ENCOUNTER — Telehealth: Payer: Self-pay | Admitting: Internal Medicine

## 2011-10-02 NOTE — Telephone Encounter (Signed)
Patient reports that she is still having diarrhea.  She is taking Lotronex 0.5 mg BID daily.  She took imodium today.  I have advised her unless ordered by Dr Leone Payor she should not take lotronex along with antidiarrheal meds like imodium.  Dr Leone Payor do we need to increase to the 1 mg Lotronex?

## 2011-10-02 NOTE — Telephone Encounter (Signed)
Left message for patient to call back  

## 2011-10-03 NOTE — Telephone Encounter (Signed)
Have her take 1 mg bid by doubling up and report back next Wed

## 2011-10-03 NOTE — Telephone Encounter (Signed)
Patient advised.  She will cal back next week with an update

## 2011-10-21 ENCOUNTER — Telehealth: Payer: Self-pay | Admitting: Internal Medicine

## 2011-10-21 DIAGNOSIS — K589 Irritable bowel syndrome without diarrhea: Secondary | ICD-10-CM

## 2011-10-21 MED ORDER — ALOSETRON HCL 0.5 MG PO TABS: 0.5000 mg | ORAL_TABLET | Freq: Two times a day (BID) | ORAL | Status: DC

## 2011-10-21 NOTE — Telephone Encounter (Signed)
rx resent, patient advised

## 2011-10-28 ENCOUNTER — Other Ambulatory Visit: Payer: Self-pay

## 2011-10-28 MED ORDER — ALOSETRON HCL 1 MG PO TABS: 1.0000 mg | ORAL_TABLET | Freq: Two times a day (BID) | ORAL | Status: DC

## 2011-10-28 NOTE — Progress Notes (Signed)
I have clarified with Dr Leone Payor that the patient should be on 21 mg lotronex BID.  I have sent her in a new rx

## 2011-11-10 ENCOUNTER — Telehealth: Payer: Self-pay | Admitting: Family

## 2011-11-10 ENCOUNTER — Ambulatory Visit (INDEPENDENT_AMBULATORY_CARE_PROVIDER_SITE_OTHER): Payer: BC Managed Care – PPO | Admitting: Internal Medicine

## 2011-11-10 ENCOUNTER — Encounter: Payer: Self-pay | Admitting: Internal Medicine

## 2011-11-10 VITALS — BP 120/70 | HR 68 | Ht 63.0 in | Wt 168.2 lb

## 2011-11-10 DIAGNOSIS — K589 Irritable bowel syndrome without diarrhea: Secondary | ICD-10-CM

## 2011-11-10 MED ORDER — ALOSETRON HCL 1 MG PO TABS: 1.0000 mg | ORAL_TABLET | Freq: Two times a day (BID) | ORAL | Status: DC

## 2011-11-10 MED ORDER — ALBUTEROL SULFATE HFA 108 (90 BASE) MCG/ACT IN AERS: 2.0000 | INHALATION_SPRAY | RESPIRATORY_TRACT | Status: DC | PRN

## 2011-11-10 NOTE — Telephone Encounter (Signed)
Refill- zoloft 50mg tab. Take one tablet by mouth daily. Qty 90 last fill 1.26.13 

## 2011-11-10 NOTE — Telephone Encounter (Signed)
Patient returned phone call and stated that she is not taking Zoloft. She is however requesting a refill on her inhaler to [facility] pharmacy. She was informed Rx for inhaler would be sent to pharmacy.  Call placed to [facility] at [phone], spoke Brett Canales, he was informed patient has verified that she is not taking the Zoloft, and it is not on the patients current list of medication. He stated that he will remove the refill request.

## 2011-11-10 NOTE — Progress Notes (Signed)
Patient ID: Crystal [name], female   DOB: [birth year], 61 y.o.   MRN: [id number]   The patient returns after initiation of Lotronex therapy for IBS. Originally on 0.5 mg twice a day with some but incomplete benefit. The dose was increased to 1 mg twice a day and she says other than occasional problems she's doing very well and is extremely happy with her quality of life. There has been no rectal bleeding, severe constipation or significant abdominal pain.  Of systems positive for a rash on the left elbow that is somewhat painful. There's a scaly rash.  Medications, allergies, past medical history, past surgical history, family history and social history are reviewed and updated in the EMR.  Exam: The left elbow is somewhat mildly hyperpigmented and there is a small amount of a scaly rash there. No discharge or weeping. The right elbow looks normal. I suggested she try one percent hydrocortisone cream and follow with primary care that was unsuccessful.  Assessment and plan:

## 2011-11-10 NOTE — Assessment & Plan Note (Signed)
Lotronex 1 mg twice a day is working very well we'll continue that. Routine followup in one year. I have advised her to look out for rectal bleeding, severe abdominal pain or constipation and call of these things happened.

## 2011-11-10 NOTE — Patient Instructions (Addendum)
Your prescription has been sent to your pharmacy for Lotronex.

## 2011-11-10 NOTE — Telephone Encounter (Signed)
Call placed to patient at [phone], no answer. A voice message was left for patient to return phone call regarding medication refill.

## 2011-11-12 ENCOUNTER — Telehealth: Payer: Self-pay | Admitting: *Deleted

## 2011-11-12 NOTE — Telephone Encounter (Signed)
Received voice message from pt that her herpes is out of control, having a lot of burning. Wants rx sent to [facility].  What are her current symptoms? Did pt get valtrex rx in January and if so, has she taken all of it? Left message for pt to return my call.

## 2011-11-12 NOTE — Telephone Encounter (Signed)
Spoke to pt, has rash and pain of buttock area. Pt does not remember picking up Valtrex rx in January. Advised pt to fill rx and take twice a day x 3 days per (07/22/11 office note) and call us if symptoms aren't improved at that time. Pt voices understanding.

## 2011-11-13 ENCOUNTER — Telehealth: Payer: Self-pay | Admitting: Family

## 2011-11-13 MED ORDER — VALACYCLOVIR HCL 500 MG PO TABS: ORAL_TABLET | ORAL | Status: DC

## 2011-11-13 NOTE — Telephone Encounter (Signed)
Refill sent to pharmacy.   

## 2011-11-13 NOTE — Telephone Encounter (Signed)
Directions called to pharmacy per 07/22/11 office note, 1 tablet twice a day for 3 days at start of outbreak.

## 2011-11-13 NOTE — Telephone Encounter (Signed)
[facility] pharmacy called and said that pt is waiting at pharmacy. The escript sent over for valACYclovir (VALTREX) 500 MG tablet needs instructions. Can NOT say, take a directed. Need more info. Pls call asap. Pt is waiting a pharmacy.

## 2011-12-01 ENCOUNTER — Telehealth: Payer: Self-pay | Admitting: Family

## 2011-12-01 NOTE — Telephone Encounter (Signed)
Left message on home # to return my call. Phone note of 11/10/11 confirms pt is not taking Zoloft; is pt needing refill of different med?

## 2011-12-01 NOTE — Telephone Encounter (Signed)
Refill- zoloft 50mg  tab. Take one tablet by mouth daily. Qty 90 last fill 1.26.13

## 2011-12-01 NOTE — Telephone Encounter (Signed)
Pt returned my call verifying that she is not requesting refill of zoloft. Needs refill of her "blue pill" but cannot think of name. Advised pt to check bottles when she gets home and call me with the name of needed medication. Pt voices understanding.

## 2011-12-19 ENCOUNTER — Ambulatory Visit (INDEPENDENT_AMBULATORY_CARE_PROVIDER_SITE_OTHER): Payer: BC Managed Care – PPO | Admitting: Family

## 2011-12-19 ENCOUNTER — Encounter: Payer: Self-pay | Admitting: Family

## 2011-12-19 VITALS — BP 102/74 | HR 98 | Temp 97.5°F | Resp 16 | Ht 63.5 in | Wt 164.0 lb

## 2011-12-19 DIAGNOSIS — E1149 Type 2 diabetes mellitus with other diabetic neurological complication: Secondary | ICD-10-CM

## 2011-12-19 DIAGNOSIS — M25529 Pain in unspecified elbow: Secondary | ICD-10-CM

## 2011-12-19 DIAGNOSIS — G609 Hereditary and idiopathic neuropathy, unspecified: Secondary | ICD-10-CM

## 2011-12-19 DIAGNOSIS — G629 Polyneuropathy, unspecified: Secondary | ICD-10-CM

## 2011-12-19 DIAGNOSIS — E1142 Type 2 diabetes mellitus with diabetic polyneuropathy: Secondary | ICD-10-CM

## 2011-12-19 DIAGNOSIS — I10 Essential (primary) hypertension: Secondary | ICD-10-CM

## 2011-12-19 DIAGNOSIS — M25522 Pain in left elbow: Secondary | ICD-10-CM | POA: Insufficient documentation

## 2011-12-19 DIAGNOSIS — E119 Type 2 diabetes mellitus without complications: Secondary | ICD-10-CM

## 2011-12-19 DIAGNOSIS — E785 Hyperlipidemia, unspecified: Secondary | ICD-10-CM

## 2011-12-19 LAB — LIPID PANEL
LDL Cholesterol: 75 mg/dL (ref 0–99)
Triglycerides: 173 mg/dL — ABNORMAL HIGH (ref <150)
VLDL: 35 mg/dL (ref 0–40)

## 2011-12-19 LAB — HEMOGLOBIN A1C: Mean Plasma Glucose: 154 mg/dL — ABNORMAL HIGH (ref <117)

## 2011-12-19 LAB — HEPATIC FUNCTION PANEL
Indirect Bilirubin: 0.5 mg/dL (ref 0.0–0.9)
Total Bilirubin: 0.6 mg/dL (ref 0.3–1.2)
Total Protein: 7.6 g/dL (ref 6.0–8.3)

## 2011-12-19 NOTE — Assessment & Plan Note (Signed)
She declines any medication for the burning sensations in her feet. Monitor.

## 2011-12-19 NOTE — Assessment & Plan Note (Signed)
BP Readings from Last 3 Encounters:  12/19/11 102/74  11/10/11 120/70  09/24/11 120/70  BP is low today.  Cut zestoretic in half, repeat bp and bmet in 1 month.

## 2011-12-19 NOTE — Patient Instructions (Signed)
Please complete your lab work prior to leaving. Follow up in 1 month.  

## 2011-12-19 NOTE — Assessment & Plan Note (Signed)
She is due for A1C today.  Will obtain.

## 2011-12-19 NOTE — Progress Notes (Signed)
Subjective:    Patient ID: Crystal [name], female    DOB: 1950-08-06, 61 y.o.   MRN: [id number]  HPI  [name] is a 61 yr old female who presents today for follow up.  1) DM2-  Reports that she has not been doing that well with diet etc. Due to worrying about her husband's health. Sugar was 175 AM at 9 AM yesterday and then  170 later at 11 AM. 2pm yesterday was 135.  2) HTN- Continues zestoretic.    3)  Toe-notes some pain in her left 4th toe in the DIP joint.  Occasional burning sensation as well.   She expresses great concern over her husband's health. He has metastatic melanoma and was recently hospitalized with urosepsis.  He came very close to death.  He remains week but has returned home.  She is his primary care give which is difficult.  Review of Systems See HPI  Past Medical History  Diagnosis Date  . Hypertension   . Hyperlipemia   . Dyspepsia   . OSA (obstructive sleep apnea)   . Plantar fasciitis   . Depression   . Asthma     02/08/10 FEV1 1.98 (80%), s/p saba 2.22 l/m (90%).nml  . Anxiety   . 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 Fatty liver 12/19/2010  . Internal hemorrhoids   . Diverticulosis   . Allergy     dust, cock roach,grass,weeds,molds  . GERD (gastroesophageal reflux disease)   . Acute peptic ulcer of stomach     As a teenager  . IBS (irritable bowel syndrome)     History   Social History  . Marital Status: Married    Spouse Name: Crystal [name]    Number of Children: 4  . Years of Education: N/A   Occupational History  . DATA ENTRY Crystal [name]   Social History Main Topics  . Smoking status: Never Smoker   . Smokeless tobacco: Never Used  . Alcohol Use: No  . Drug Use: No  . Sexually Active: Not on file   Other Topics Concern  . Not on file   Social History Narrative   No caffeine drinks daily     Past Surgical History  Procedure Date  . Knee arthroscopy 1998    left knee  . Toe surgery 1995    right  . Nasal septum surgery   .  Rhinoplasty   . Appendectomy     age 2  . Colonoscopy 06/03/2010, 08/25/2011    diverticulosis, internal and external hemorrhoids, random biopsies negative in 2013  . Gastrectomy     partial - ulcer as a teen    Family History  Problem Relation Age of Onset  . Diabetes Mother     type II  . Heart attack Mother 89  . Ulcers      bleeding gastric  . Colon cancer Sister   . Heart defect      child  . Other      perforated bowel, child    Allergies  Allergen Reactions  . Prochlorperazine Edisylate     REACTION: coma for 3 days  . Tetanus Toxoid Other (See Comments)    REACTION: convulsions    Current Outpatient Prescriptions on File Prior to Visit  Medication Sig Dispense Refill  . albuterol (PROAIR HFA) 108 (90 BASE) MCG/ACT inhaler Inhale 2 puffs into the lungs every 4 (four) hours as needed. Shortness of breath and wheezing  18 g  1  .  alosetron (LOTRONEX) 1 MG tablet Take 1 tablet (1 mg total) by mouth 2 (two) times daily.  60 tablet  11  . chlorpheniramine-HYDROcodone (TUSSIONEX PENNKINETIC ER) 10-8 MG/5ML LQCR Take 5 mLs by mouth every 12 (twelve) hours as needed.  140 mL  0  . fluticasone (VERAMYST) 27.5 MCG/SPRAY nasal spray 1 spray by Nasal route 2 (two) times daily.        . Fluticasone-Salmeterol (ADVAIR DISKUS) 250-50 MCG/DOSE AEPB Inhale 1 puff into the lungs 2 (two) times daily.  60 each  3  . loratadine (CLARITIN) 10 MG tablet Take 2 tablets by mouth daily.       . metFORMIN (GLUCOPHAGE) 500 MG tablet One tablet by mouth twice daily  180 tablet  1  . Multiple Vitamins-Minerals (CENTRUM SILVER PO) Take 1 tablet by mouth daily.        . Probiotic Product (ALIGN) 4 MG CAPS Take 1 capsule by mouth daily.        . ranitidine (ZANTAC) 150 MG capsule Take 1 capsule (150 mg total) by mouth 2 (two) times daily.  180 capsule  1  . simvastatin (ZOCOR) 40 MG tablet Take 1 tablet (40 mg total) by mouth at bedtime.  90 tablet  1  . triamcinolone (KENALOG) 0.1 % cream Apply  topically 3 (three) times daily. Apply to affected skin  30 g  0  . valACYclovir (VALTREX) 500 MG tablet Take as directed.  30 tablet  0  . DISCONTD: lisinopril-hydrochlorothiazide (PRINZIDE,ZESTORETIC) 20-12.5 MG per tablet Take 1 tablet by mouth daily.  90 tablet  1    BP 102/74  Pulse 98  Temp(Src) 97.5 F (36.4 C) (Oral)  Resp 16  Ht 5' 3.5" (1.613 m)  Wt 164 lb (74.39 kg)  BMI 28.60 kg/m2  SpO2 97%       Objective:   Physical Exam  Constitutional: She appears well-developed and well-nourished. No distress.  Cardiovascular: Normal rate and regular rhythm.   No murmur heard. Pulmonary/Chest: Effort normal and breath sounds normal. No respiratory distress. She has no wheezes. She has no rales. She exhibits no tenderness.  Musculoskeletal:       Full ROM of left elbow without swelling.   Skin:       Dry callous noted left elbow.  Mild darkening of toes on left foot, appears more venous stasis changes, no rash or erythema.    Psychiatric:       Tearful upon discussion of her husband's illness.           Assessment & Plan:

## 2011-12-19 NOTE — Assessment & Plan Note (Signed)
Likely OA, recommended tylenol PRN.

## 2011-12-20 LAB — MICROALBUMIN / CREATININE URINE RATIO
Creatinine, Urine: 225.8 mg/dL
Microalb, Ur: 1.2 mg/dL (ref 0.00–1.89)

## 2011-12-22 ENCOUNTER — Telehealth: Payer: Self-pay | Admitting: Family

## 2011-12-22 MED ORDER — METFORMIN HCL 1000 MG PO TABS: 1000.0000 mg | ORAL_TABLET | Freq: Two times a day (BID) | ORAL | Status: DC

## 2011-12-22 NOTE — Telephone Encounter (Signed)
Notified pt and she voices understanding. 

## 2011-12-22 NOTE — Telephone Encounter (Signed)
Left message on home # to return my call. 

## 2011-12-22 NOTE — Telephone Encounter (Signed)
Pls call pt and let her know that A1C is up a bit at 7.  I would like her to increase metformin fro 500mg  bid to 1000mg  bid.  Rx has been sent to [facility].  Cholesterol is improved and liver function is stable.

## 2012-01-19 ENCOUNTER — Ambulatory Visit: Payer: BC Managed Care – PPO | Admitting: Family

## 2012-01-20 ENCOUNTER — Encounter: Payer: Self-pay | Admitting: Family

## 2012-01-20 ENCOUNTER — Ambulatory Visit (INDEPENDENT_AMBULATORY_CARE_PROVIDER_SITE_OTHER): Payer: BC Managed Care – PPO | Admitting: Family

## 2012-01-20 VITALS — BP 126/86 | HR 95 | Temp 97.9°F | Resp 16 | Ht 63.5 in | Wt 163.1 lb

## 2012-01-20 DIAGNOSIS — I10 Essential (primary) hypertension: Secondary | ICD-10-CM

## 2012-01-20 NOTE — Patient Instructions (Addendum)
Please follow up in mid September.

## 2012-01-20 NOTE — Progress Notes (Signed)
  Subjective:    Patient ID: Crystal [name], female    DOB: [birth year], 61 y.o.   MRN: [id number]  HPI  Crystal [name] is a 61 yr old female who presents today for follow up of her HTN.  Last visit her zestoretic was cut in half because her blood pressure was low.  Denies cp, swelling, sob.    BP Readings from Last 3 Encounters:  01/20/12 126/86  12/19/11 102/74  11/10/11 120/70    Review of Systems See HPI    Objective:   Physical Exam  Constitutional: She appears well-developed and well-nourished. No distress.  Cardiovascular: Normal rate and regular rhythm.   No murmur heard.         Assessment & Plan:

## 2012-01-20 NOTE — Assessment & Plan Note (Signed)
Stable/improved.  Continue zestoretic at half dosing.

## 2012-03-06 ENCOUNTER — Emergency Department (HOSPITAL_BASED_OUTPATIENT_CLINIC_OR_DEPARTMENT_OTHER): Payer: No Typology Code available for payment source

## 2012-03-06 ENCOUNTER — Emergency Department (HOSPITAL_BASED_OUTPATIENT_CLINIC_OR_DEPARTMENT_OTHER)
Admission: EM | Admit: 2012-03-06 | Discharge: 2012-03-07 | Disposition: A | Discharge status: Home / Self Care | Payer: No Typology Code available for payment source | Attending: Emergency Medicine | Admitting: Emergency Medicine

## 2012-03-06 ENCOUNTER — Encounter (HOSPITAL_BASED_OUTPATIENT_CLINIC_OR_DEPARTMENT_OTHER): Payer: Self-pay | Admitting: *Deleted

## 2012-03-06 DIAGNOSIS — IMO0002 Reserved for concepts with insufficient information to code with codable children: Secondary | ICD-10-CM | POA: Insufficient documentation

## 2012-03-06 DIAGNOSIS — G4733 Obstructive sleep apnea (adult) (pediatric): Secondary | ICD-10-CM | POA: Insufficient documentation

## 2012-03-06 DIAGNOSIS — S5012XA Contusion of left forearm, initial encounter: Secondary | ICD-10-CM

## 2012-03-06 DIAGNOSIS — S5010XA Contusion of unspecified forearm, initial encounter: Secondary | ICD-10-CM | POA: Insufficient documentation

## 2012-03-06 DIAGNOSIS — Y93K1 Activity, walking an animal: Secondary | ICD-10-CM | POA: Insufficient documentation

## 2012-03-06 DIAGNOSIS — K589 Irritable bowel syndrome without diarrhea: Secondary | ICD-10-CM | POA: Insufficient documentation

## 2012-03-06 DIAGNOSIS — E119 Type 2 diabetes mellitus without complications: Secondary | ICD-10-CM | POA: Insufficient documentation

## 2012-03-06 DIAGNOSIS — I10 Essential (primary) hypertension: Secondary | ICD-10-CM | POA: Insufficient documentation

## 2012-03-06 DIAGNOSIS — Y998 Other external cause status: Secondary | ICD-10-CM | POA: Insufficient documentation

## 2012-03-06 DIAGNOSIS — S46909A Unspecified injury of unspecified muscle, fascia and tendon at shoulder and upper arm level, unspecified arm, initial encounter: Secondary | ICD-10-CM | POA: Insufficient documentation

## 2012-03-06 DIAGNOSIS — K219 Gastro-esophageal reflux disease without esophagitis: Secondary | ICD-10-CM | POA: Insufficient documentation

## 2012-03-06 DIAGNOSIS — S4980XA Other specified injuries of shoulder and upper arm, unspecified arm, initial encounter: Secondary | ICD-10-CM | POA: Insufficient documentation

## 2012-03-06 DIAGNOSIS — E785 Hyperlipidemia, unspecified: Secondary | ICD-10-CM | POA: Insufficient documentation

## 2012-03-06 DIAGNOSIS — S4990XA Unspecified injury of shoulder and upper arm, unspecified arm, initial encounter: Secondary | ICD-10-CM

## 2012-03-06 MED ORDER — HYDROMORPHONE HCL PF 2 MG/ML IJ SOLN
2.0000 mg | Freq: Once | INTRAMUSCULAR | Status: AC
  Administered 2012-03-06: 2 mg via INTRAMUSCULAR
  Filled 2012-03-06: qty 1

## 2012-03-06 MED ORDER — HYDROCODONE-ACETAMINOPHEN 5-325 MG PO TABS: 1.0000 | ORAL_TABLET | Freq: Four times a day (QID) | ORAL | Status: AC | PRN
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
## 2012-03-07 NOTE — ED Notes (Signed)
D/c home with family- rx x 1 given for vicodin- pt taken in wheelchair

## 2012-03-27 ENCOUNTER — Emergency Department (HOSPITAL_BASED_OUTPATIENT_CLINIC_OR_DEPARTMENT_OTHER)
Admission: EM | Admit: 2012-03-27 | Discharge: 2012-03-27 | Disposition: A | Discharge status: Home / Self Care | Payer: BC Managed Care – PPO | Attending: Emergency Medicine | Admitting: Emergency Medicine

## 2012-03-27 ENCOUNTER — Encounter (HOSPITAL_BASED_OUTPATIENT_CLINIC_OR_DEPARTMENT_OTHER): Payer: Self-pay | Admitting: *Deleted

## 2012-03-27 DIAGNOSIS — E785 Hyperlipidemia, unspecified: Secondary | ICD-10-CM | POA: Insufficient documentation

## 2012-03-27 DIAGNOSIS — J45909 Unspecified asthma, uncomplicated: Secondary | ICD-10-CM | POA: Insufficient documentation

## 2012-03-27 DIAGNOSIS — E119 Type 2 diabetes mellitus without complications: Secondary | ICD-10-CM | POA: Insufficient documentation

## 2012-03-27 DIAGNOSIS — A6004 Herpesviral vulvovaginitis: Secondary | ICD-10-CM | POA: Insufficient documentation

## 2012-03-27 DIAGNOSIS — K589 Irritable bowel syndrome without diarrhea: Secondary | ICD-10-CM | POA: Insufficient documentation

## 2012-03-27 DIAGNOSIS — Z79899 Other long term (current) drug therapy: Secondary | ICD-10-CM | POA: Insufficient documentation

## 2012-03-27 DIAGNOSIS — R102 Pelvic and perineal pain: Secondary | ICD-10-CM

## 2012-03-27 DIAGNOSIS — F3289 Other specified depressive episodes: Secondary | ICD-10-CM | POA: Insufficient documentation

## 2012-03-27 DIAGNOSIS — B009 Herpesviral infection, unspecified: Secondary | ICD-10-CM

## 2012-03-27 DIAGNOSIS — K219 Gastro-esophageal reflux disease without esophagitis: Secondary | ICD-10-CM | POA: Insufficient documentation

## 2012-03-27 DIAGNOSIS — F329 Major depressive disorder, single episode, unspecified: Secondary | ICD-10-CM | POA: Insufficient documentation

## 2012-03-27 DIAGNOSIS — I10 Essential (primary) hypertension: Secondary | ICD-10-CM | POA: Insufficient documentation

## 2012-03-27 MED ORDER — HYDROCODONE-ACETAMINOPHEN 5-325 MG PO TABS: 1.0000 | ORAL_TABLET | Freq: Four times a day (QID) | ORAL | Status: DC | PRN

## 2012-03-27 NOTE — ED Notes (Signed)
Patient states that she has herpes. And is currently having an outbreak.

## 2012-03-27 NOTE — ED Provider Notes (Signed)
History     CSN: [id number]  Arrival date & time 03/27/12  1054   First MD Initiated Contact with Patient 03/27/12 1120      Chief Complaint  Patient presents with  . Vaginal Itching    (Consider location/radiation/quality/duration/timing/severity/associated sxs/prior treatment) Patient is a 61 y.o. female presenting with vaginal itching. The history is provided by the patient.  Vaginal Itching This is a new problem. The current episode started more than 2 days ago. The problem occurs constantly. Pertinent negatives include no chest pain, no abdominal pain, no headaches and no shortness of breath.   patient does not really have vaginal itching as or vaginal pain. Patient apparently is being treated with acyclovir air which she has at home she's had outbreaks supposedly of herpetic lesions in the past but this was the same. His secretary does not seem to be helping much pain has been increasing. Pain is a 10 out of 10. No nausea no vomiting no fever. No significant discharge.  Past Medical History  Diagnosis Date  . Hypertension   . Hyperlipemia   . Dyspepsia   . OSA (obstructive sleep apnea)   . Plantar fasciitis   . Depression   . Asthma     02/08/10 FEV1 1.98 (80%), s/p saba 2.22 l/m (90%).nml  . Anxiety   . 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 Fatty liver 12/19/2010  . Internal hemorrhoids   . Diverticulosis   . Allergy     dust, cock roach,grass,weeds,molds  . GERD (gastroesophageal reflux disease)   . Acute peptic ulcer of stomach     As a teenager  . IBS (irritable bowel syndrome)     Past Surgical History  Procedure Date  . Knee arthroscopy 1998    left knee  . Toe surgery 1995    right  . Nasal septum surgery   . Rhinoplasty   . Appendectomy     age 32  . Colonoscopy 06/03/2010, 08/25/2011    diverticulosis, internal and external hemorrhoids, random biopsies negative in 2013  . Gastrectomy     partial - ulcer as a teen    Family History  Problem  Relation Age of Onset  . Diabetes Mother     type II  . Heart attack Mother 68  . Ulcers      bleeding gastric  . Colon cancer Sister   . Heart defect      child  . Other      perforated bowel, child    History  Substance Use Topics  . Smoking status: Never Smoker   . Smokeless tobacco: Never Used  . Alcohol Use: No    OB History    Grav Para Term Preterm Abortions TAB SAB Ect Mult Living   4 4             Obstetric Comments   1 child died at 6 weeks (heart defects, perforated bowel)      Review of Systems  Constitutional: Negative for fever.  HENT: Negative for neck pain.   Respiratory: Negative for shortness of breath.   Cardiovascular: Negative for chest pain.  Gastrointestinal: Negative for nausea, vomiting and abdominal pain.  Genitourinary: Positive for vaginal pain. Negative for dysuria, hematuria, vaginal bleeding and vaginal discharge.  Musculoskeletal: Negative for back pain.  Skin: Negative for rash.  Neurological: Negative for headaches.  Hematological: Does not bruise/bleed easily.  Psychiatric/Behavioral: Negative for confusion.    Allergies  Prochlorperazine edisylate and Tetanus toxoid  Home Medications   Current Outpatient Rx  Name Route Sig Dispense Refill  . VALACYCLOVIR HCL 1 G PO TABS Oral Take 1,000 mg by mouth 2 (two) times daily.    . ALBUTEROL SULFATE HFA 108 (90 BASE) MCG/ACT IN AERS Inhalation Inhale 2 puffs into the lungs every 4 (four) hours as needed. Shortness of breath and wheezing 18 g 1  . ALOSETRON HCL 1 MG PO TABS Oral Take 1 tablet (1 mg total) by mouth 2 (two) times daily. 60 tablet 11  . FLUTICASONE-SALMETEROL 250-50 MCG/DOSE IN AEPB Inhalation Inhale 1 puff into the lungs 2 (two) times daily. 60 each 3  . HYDROCODONE-ACETAMINOPHEN 5-325 MG PO TABS Oral Take 1-2 tablets by mouth every 6 (six) hours as needed for pain. 10 tablet 0  . LISINOPRIL-HYDROCHLOROTHIAZIDE 20-12.5 MG PO TABS Oral Take 0.5 tablets by mouth daily.      Marland Kitchen LORATADINE 10 MG PO TABS  Take 2 tablets by mouth daily.     Marland Kitchen METFORMIN HCL 1000 MG PO TABS Oral Take 1 tablet (1,000 mg total) by mouth 2 (two) times daily with a meal. 60 tablet 3  . CENTRUM SILVER PO Oral Take 1 tablet by mouth daily.      Marland Kitchen ALIGN 4 MG PO CAPS Oral Take 1 capsule by mouth daily.      Marland Kitchen RANITIDINE HCL 150 MG PO CAPS Oral Take 1 capsule (150 mg total) by mouth 2 (two) times daily. 180 capsule 1  . SIMVASTATIN 40 MG PO TABS Oral Take 1 tablet (40 mg total) by mouth at bedtime. 90 tablet 1    BP 138/85  Pulse 93  Temp 97.3 F (36.3 C) (Oral)  Resp 20  SpO2 99%  Physical Exam  Nursing note and vitals reviewed. Constitutional: She is oriented to person, place, and time. She appears well-developed and well-nourished.  HENT:  Head: Normocephalic and atraumatic.  Mouth/Throat: Oropharynx is clear and moist.  Eyes: Conjunctivae normal and EOM are normal. Pupils are equal, round, and reactive to light.  Neck: Normal range of motion. Neck supple.  Cardiovascular: Normal rate, regular rhythm and normal heart sounds.   No murmur heard. Pulmonary/Chest: Effort normal and breath sounds normal.  Abdominal: Soft. Bowel sounds are normal. There is no tenderness.  Genitourinary: Uterus normal. No vaginal discharge found.       External genitalia with some erythema but no blister lesions. No perianal lesions. Vaginal vault will bit of erythema distally cervix nontender uterus nontender adnexa nontender. No significant vaginal discharge. No vaginal bleeding.  Musculoskeletal: Normal range of motion.  Neurological: She is alert and oriented to person, place, and time. No cranial nerve deficit. She exhibits normal muscle tone. Coordination normal.  Skin: Skin is warm. No rash noted.    ED Course  Procedures (including critical care time)  Labs Reviewed  WET PREP, GENITAL - Abnormal; Notable for the following:    Clue Cells Wet Prep HPF POC FEW (*)     WBC, Wet Prep HPF POC  TOO NUMEROUS TO COUNT (*)     All other components within normal limits  GC/CHLAMYDIA PROBE AMP, GENITAL   No results found. Results for orders placed during the hospital encounter of 03/27/12  WET PREP, GENITAL      Component Value Range   Yeast Wet Prep HPF POC NONE SEEN  NONE SEEN   Trich, Wet Prep NONE SEEN  NONE SEEN   Clue Cells Wet Prep HPF POC FEW (*) NONE SEEN  WBC, Wet Prep HPF POC TOO NUMEROUS TO COUNT (*) NONE SEEN     1. Vaginal pain   2. Herpes       MDM  No distinct evidence of a herpetic lesions but there may be some irritation at the introitus area perhaps the posterior some broken out yet this may be the predome pain. Patient will continue her acyclovir air we'll treat with pain medicine. Wet prep without any significant findings. Cultures pending.        Shelda Jakes, MD 03/27/12 [phone]

## 2012-03-29 LAB — GC/CHLAMYDIA PROBE AMP, GENITAL: GC Probe Amp, Genital: NEGATIVE

## 2012-03-31 ENCOUNTER — Ambulatory Visit (INDEPENDENT_AMBULATORY_CARE_PROVIDER_SITE_OTHER): Payer: BC Managed Care – PPO | Admitting: Family

## 2012-03-31 ENCOUNTER — Encounter: Payer: Self-pay | Admitting: Family

## 2012-03-31 VITALS — BP 100/70 | HR 84 | Temp 98.3°F | Resp 16 | Ht 63.5 in | Wt 159.0 lb

## 2012-03-31 DIAGNOSIS — I10 Essential (primary) hypertension: Secondary | ICD-10-CM

## 2012-03-31 DIAGNOSIS — E785 Hyperlipidemia, unspecified: Secondary | ICD-10-CM

## 2012-03-31 DIAGNOSIS — K589 Irritable bowel syndrome without diarrhea: Secondary | ICD-10-CM

## 2012-03-31 DIAGNOSIS — Z23 Encounter for immunization: Secondary | ICD-10-CM

## 2012-03-31 DIAGNOSIS — E1142 Type 2 diabetes mellitus with diabetic polyneuropathy: Secondary | ICD-10-CM

## 2012-03-31 DIAGNOSIS — G4733 Obstructive sleep apnea (adult) (pediatric): Secondary | ICD-10-CM

## 2012-03-31 DIAGNOSIS — F341 Dysthymic disorder: Secondary | ICD-10-CM

## 2012-03-31 DIAGNOSIS — E119 Type 2 diabetes mellitus without complications: Secondary | ICD-10-CM

## 2012-03-31 DIAGNOSIS — E1149 Type 2 diabetes mellitus with other diabetic neurological complication: Secondary | ICD-10-CM

## 2012-03-31 LAB — BASIC METABOLIC PANEL
BUN: 14 mg/dL (ref 6–23)
Chloride: 103 mEq/L (ref 96–112)
Glucose, Bld: 126 mg/dL — ABNORMAL HIGH (ref 70–99)
Potassium: 4.5 mEq/L (ref 3.5–5.3)

## 2012-03-31 LAB — HEPATIC FUNCTION PANEL
Alkaline Phosphatase: 42 U/L (ref 39–117)
Indirect Bilirubin: 0.3 mg/dL (ref 0.0–0.9)
Total Protein: 6.9 g/dL (ref 6.0–8.3)

## 2012-03-31 LAB — HEMOGLOBIN A1C: Mean Plasma Glucose: 146 mg/dL — ABNORMAL HIGH (ref <117)

## 2012-03-31 MED ORDER — SERTRALINE HCL 50 MG PO TABS: 50.0000 mg | ORAL_TABLET | Freq: Every day | ORAL | Status: DC

## 2012-03-31 NOTE — Patient Instructions (Addendum)
Please complete your blood work prior to leaving.  Please follow in 6 weeks.

## 2012-03-31 NOTE — Progress Notes (Signed)
Subjective:    Patient ID: Crystal [name], female    DOB: 1950-09-05, 61 y.o.   MRN: [id number]  HPI  [name] is a 61 yr old female who presents today for follow up.  She lost her husband to metastatic melanoma about a month ago.   OSA- Needs new cpap machine.  Hers is 57 yrs old.  Dog chewed the machine.  Depression/anxiety- not sleeping well, tearful often.    DM2- sugar was 187 last she checked.   IBS- diarrhea has worsened due to stress.  HSV- + HSV breakout. She is using valtrex.   Review of Systems See HPI  Past Medical History  Diagnosis Date  . Hypertension   . Hyperlipemia   . Dyspepsia   . OSA (obstructive sleep apnea)   . Plantar fasciitis   . Depression   . Asthma     02/08/10 FEV1 1.98 (80%), s/p saba 2.22 l/m (90%).nml  . Anxiety   . 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 Fatty liver 12/19/2010  . Internal hemorrhoids   . Diverticulosis   . Allergy     dust, cock roach,grass,weeds,molds  . GERD (gastroesophageal reflux disease)   . Acute peptic ulcer of stomach     As a teenager  . IBS (irritable bowel syndrome)     History   Social History  . Marital Status: Married    Spouse Name: Crystal [name]    Number of Children: 4  . 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 Smoking status: Never Smoker   . Smokeless tobacco: Never Used  . Alcohol Use: No  . Drug Use: No  . Sexually Active: Not on file   Other Topics Concern  . Not on file   Social History Narrative   No caffeine drinks daily     Past Surgical History  Procedure Date  . Knee arthroscopy 1998    left knee  . Toe surgery 1995    right  . Nasal septum surgery   . Rhinoplasty   . Appendectomy     age 68  . Colonoscopy 06/03/2010, 08/25/2011    diverticulosis, internal and external hemorrhoids, random biopsies negative in 2013  . Gastrectomy     partial - ulcer as a teen    Family History  Problem Relation Age of  Onset  . Diabetes Mother     type II  . Heart attack Mother 48  . Ulcers      bleeding gastric  . Colon cancer Sister   . Heart defect      child  . Other      perforated bowel, child    Allergies  Allergen Reactions  . Prochlorperazine Edisylate Other (See Comments)    coma for 3 days  . Tetanus Toxoid Other (See Comments)    convulsions    Current Outpatient Prescriptions on File Prior to Visit  Medication Sig Dispense Refill  . albuterol (PROAIR HFA) 108 (90 BASE) MCG/ACT inhaler Inhale 2 puffs into the lungs every 4 (four) hours as needed. Shortness of breath and wheezing  18 g  1  . alosetron (LOTRONEX) 1 MG tablet Take 1 tablet (1 mg total) by mouth 2 (two) times daily.  60 tablet  11  . Fluticasone-Salmeterol (ADVAIR DISKUS) 250-50 MCG/DOSE AEPB Inhale 1 puff into the lungs 2 (two) times daily.  60 each  3  . HYDROcodone-acetaminophen (NORCO/VICODIN)  5-325 MG per tablet Take 1-2 tablets by mouth every 6 (six) hours as needed for pain.  10 tablet  0  . lisinopril-hydrochlorothiazide (PRINZIDE,ZESTORETIC) 20-12.5 MG per tablet Take 0.5 tablets by mouth daily.      Marland Kitchen loratadine (CLARITIN) 10 MG tablet Take 2 tablets by mouth daily.       . metFORMIN (GLUCOPHAGE) 1000 MG tablet Take 1 tablet (1,000 mg total) by mouth 2 (two) times daily with a meal.  60 tablet  3  . Multiple Vitamins-Minerals (CENTRUM SILVER PO) Take 1 tablet by mouth daily.        . Probiotic Product (ALIGN) 4 MG CAPS Take 1 capsule by mouth daily.        . ranitidine (ZANTAC) 150 MG capsule Take 1 capsule (150 mg total) by mouth 2 (two) times daily.  180 capsule  1  . simvastatin (ZOCOR) 40 MG tablet Take 1 tablet (40 mg total) by mouth at bedtime.  90 tablet  1  . valACYclovir (VALTREX) 1000 MG tablet Take 1,000 mg by mouth 2 (two) times daily.      . sertraline (ZOLOFT) 50 MG tablet Take 1 tablet (50 mg total) by mouth daily.  30 tablet  1    BP 100/70  Pulse 84  Temp 98.3 F (36.8 C) (Oral)  Resp 16   Ht 5' 3.5" (1.613 m)  Wt 159 lb (72.122 kg)  BMI 27.72 kg/m2  SpO2 99%       Objective:   Physical Exam  Constitutional: She appears well-developed and well-nourished.  Cardiovascular: Normal rate and regular rhythm.   No murmur heard. Pulmonary/Chest: Effort normal and breath sounds normal. No respiratory distress. She has no wheezes. She has no rales. She exhibits no tenderness.  Musculoskeletal: She exhibits no edema.  Psychiatric:       Tearful but appropriate.          Assessment & Plan:

## 2012-03-31 NOTE — Assessment & Plan Note (Signed)
Flu shot today.  Obtain A1C, continue metformin.

## 2012-03-31 NOTE — Assessment & Plan Note (Signed)
On simastatin.  Continue same, obtain LFT.  LDL 75 3/13.

## 2012-03-31 NOTE — Assessment & Plan Note (Signed)
BP Readings from Last 3 Encounters:  03/31/12 100/70  03/27/12 138/85  03/06/12 138/87   BP stable on lisinopril hctz, continue same.

## 2012-03-31 NOTE — Assessment & Plan Note (Addendum)
Declines referral for grief counseling at this time due to work schedule. Trial of sertraline.  I instructed pt to start 1/2 tablet once daily for 1 week and then increase to a full tablet once daily on week two as tolerated.  We discussed common side effects such as nausea, drowsiness and weight gain.  Also discussed rare but serious side effect of suicide ideation.  She is instructed to discontinue medication go directly to ED if this occurs.  Pt verbalizes understanding.  Plan follow up in 1 month to evaluate progress.

## 2012-03-31 NOTE — Assessment & Plan Note (Signed)
Continue lotronex.  Likely exacerbated by stress.

## 2012-03-31 NOTE — Assessment & Plan Note (Signed)
Will request new cpap machine from medical supply.

## 2012-04-01 ENCOUNTER — Ambulatory Visit: Payer: BC Managed Care – PPO | Admitting: Critical Care Medicine

## 2012-04-02 ENCOUNTER — Encounter: Payer: Self-pay | Admitting: Family

## 2012-04-14 ENCOUNTER — Telehealth: Payer: Self-pay | Admitting: Family

## 2012-04-14 NOTE — Telephone Encounter (Signed)
Left message to try to cut zoloft back to 25 mg once daily, keep close watch on sugars, follow up in 2-3 weeks, call if further questions.

## 2012-04-14 NOTE — Telephone Encounter (Signed)
Patient states that her blood sugar has been running high ever since starting the sleep medication. She states that she wakes up shaky every morning. She says that her blood sugar is running between 180-230.   Patient states that it is okay to leave a detailed message on phone.

## 2012-04-15 ENCOUNTER — Telehealth: Payer: Self-pay | Admitting: Family

## 2012-04-15 ENCOUNTER — Ambulatory Visit (INDEPENDENT_AMBULATORY_CARE_PROVIDER_SITE_OTHER): Payer: BC Managed Care – PPO | Admitting: Critical Care Medicine

## 2012-04-15 ENCOUNTER — Encounter: Payer: Self-pay | Admitting: Critical Care Medicine

## 2012-04-15 VITALS — BP 120/80 | HR 90 | Temp 97.7°F | Ht 63.5 in | Wt 159.0 lb

## 2012-04-15 DIAGNOSIS — J45909 Unspecified asthma, uncomplicated: Secondary | ICD-10-CM

## 2012-04-15 MED ORDER — PREDNISONE 10 MG PO TABS: ORAL_TABLET | ORAL | Status: DC

## 2012-04-15 MED ORDER — FLUTICASONE-SALMETEROL 250-50 MCG/DOSE IN AEPB: 1.0000 | INHALATION_SPRAY | Freq: Two times a day (BID) | RESPIRATORY_TRACT | Status: DC

## 2012-04-15 MED ORDER — AMOXICILLIN-POT CLAVULANATE 875-125 MG PO TABS: 1.0000 | ORAL_TABLET | Freq: Two times a day (BID) | ORAL | Status: DC

## 2012-04-15 MED ORDER — MOMETASONE FUROATE 50 MCG/ACT NA SUSP: 2.0000 | Freq: Every day | NASAL | Status: DC

## 2012-04-15 NOTE — Progress Notes (Signed)
Subjective:    Patient ID: Crystal [name], female    DOB: [birth year], 61 y.o.   MRN: [id number]  HPI   61 y.o. female with known hx of HTN, DM and Hyperlipidmia, asthma.     Not seen since 08/2010.   04/15/2012 Symptoms now is facial congestion and cough for two weeks.  Pt was in rain with grandchildren. URI developed, no ABX yet given .  Using OTC cough syrup.   No real dyspnea.  NOt using advair regularly   Past Medical History  Diagnosis Date  . Hypertension   . Hyperlipemia   . Dyspepsia   . OSA (obstructive sleep apnea)   . Plantar fasciitis   . Depression   . Asthma     02/08/10 FEV1 1.98 (80%), s/p saba 2.22 l/m (90%).nml  . Anxiety   . 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 Fatty liver 12/19/2010  . Internal hemorrhoids   . Diverticulosis   . Allergy     dust, cock roach,grass,weeds,molds  . GERD (gastroesophageal reflux disease)   . Acute peptic ulcer of stomach     As a teenager  . IBS (irritable bowel syndrome)      Family History  Problem Relation Age of Onset  . Diabetes Mother     type II  . Heart attack Mother 71  . Ulcers      bleeding gastric  . Colon cancer Sister   . Heart defect      child  . Other      perforated bowel, child     History   Social History  . Marital Status: Married    Spouse Name: [name]    Number of Children: 4  . 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 Smoking status: Never Smoker   . Smokeless tobacco: Never Used  . Alcohol Use: No  . Drug Use: No  . Sexually Active: Not on file   Other Topics Concern  . Not on file   Social History Narrative   No caffeine drinks daily      Allergies  Allergen Reactions  . Prochlorperazine Edisylate Other (See Comments)    coma for 3 days  . Tetanus Toxoid Other (See Comments)    convulsions     Outpatient Prescriptions Prior to Visit  Medication Sig Dispense Refill  . albuterol (PROAIR HFA) 108 (90 BASE)  MCG/ACT inhaler Inhale 2 puffs into the lungs every 4 (four) hours as needed. Shortness of breath and wheezing  18 g  1  . alosetron (LOTRONEX) 1 MG tablet Take 1 tablet (1 mg total) by mouth 2 (two) times daily.  60 tablet  11  . lisinopril-hydrochlorothiazide (PRINZIDE,ZESTORETIC) 20-12.5 MG per tablet Take 0.5 tablets by mouth daily.      Marland Kitchen loratadine (CLARITIN) 10 MG tablet Take 2 tablets by mouth daily.       . metFORMIN (GLUCOPHAGE) 1000 MG tablet Take 1 tablet (1,000 mg total) by mouth 2 (two) times daily with a meal.  60 tablet  3  . Multiple Vitamins-Minerals (CENTRUM SILVER PO) Take 1 tablet by mouth daily.        . Probiotic Product (ALIGN) 4 MG CAPS Take 1 capsule by mouth daily.        . ranitidine (ZANTAC) 150 MG capsule Take 1 capsule (150 mg total) by mouth 2 (two) times daily.  180 capsule  1  .  simvastatin (ZOCOR) 40 MG tablet Take 1 tablet (40 mg total) by mouth at bedtime.  90 tablet  1  . valACYclovir (VALTREX) 1000 MG tablet Take 1,000 mg by mouth 2 (two) times daily.      . Fluticasone-Salmeterol (ADVAIR DISKUS) 250-50 MCG/DOSE AEPB Inhale 1 puff into the lungs 2 (two) times daily.  60 each  3  . sertraline (ZOLOFT) 50 MG tablet Take 1 tablet (50 mg total) by mouth daily.  30 tablet  1      Review of Systems 11 pt ros neg    Objective:   Physical Exam  Filed Vitals:   04/15/12 1628  BP: 120/80  Pulse: 90  Temp: 97.7 F (36.5 C)  TempSrc: Oral  Height: 5' 3.5" (1.613 m)  Weight: 72.122 kg (159 lb)  SpO2: 95%    Gen: Pleasant, well-nourished, in no distress,  normal affect  ENT: No lesions,  mouth clear,  oropharynx clear, no postnasal drip  Neck: No JVD, no TMG, no carotid bruits  Lungs: No use of accessory muscles, no dullness to percussion, exp wheezes Cardiovascular: RRR, heart sounds normal, no murmur or gallops, no peripheral edema  Abdomen: soft and NT, no HSM,  BS normal  Musculoskeletal: No deformities, no cyanosis or clubbing  Neuro: alert,  non focal  Skin: Warm, no lesions or rashes         Assessment & Plan:   ASTHMA Asthmatic bronchitis with flare   Plan Start nasonex two puff daily each nostril (use sample>>refill sent to local [facility] pharmacy) Prednisone 10mg  Take 4 for two days three for two days two for two days one for two days Augmentin one twice daily for 7days Stay on advair Return 6 weeks for recheck with Dr Vassie Loll    Updated Medication List Outpatient Encounter Prescriptions as of 04/15/2012  Medication Sig Dispense Refill  . albuterol (PROAIR HFA) 108 (90 BASE) MCG/ACT inhaler Inhale 2 puffs into the lungs every 4 (four) hours as needed. Shortness of breath and wheezing  18 g  1  . alosetron (LOTRONEX) 1 MG tablet Take 1 tablet (1 mg total) by mouth 2 (two) times daily.  60 tablet  11  . Fluticasone-Salmeterol (ADVAIR DISKUS) 250-50 MCG/DOSE AEPB Inhale 1 puff into the lungs 2 (two) times daily.  60 each  6  . lisinopril-hydrochlorothiazide (PRINZIDE,ZESTORETIC) 20-12.5 MG per tablet Take 0.5 tablets by mouth daily.      Marland Kitchen loratadine (CLARITIN) 10 MG tablet Take 2 tablets by mouth daily.       . metFORMIN (GLUCOPHAGE) 1000 MG tablet Take 1 tablet (1,000 mg total) by mouth 2 (two) times daily with a meal.  60 tablet  3  . Multiple Vitamins-Minerals (CENTRUM SILVER PO) Take 1 tablet by mouth daily.        . Probiotic Product (ALIGN) 4 MG CAPS Take 1 capsule by mouth daily.        . ranitidine (ZANTAC) 150 MG capsule Take 1 capsule (150 mg total) by mouth 2 (two) times daily.  180 capsule  1  . sertraline (ZOLOFT) 50 MG tablet Take 25 mg by mouth daily.      . simvastatin (ZOCOR) 40 MG tablet Take 1 tablet (40 mg total) by mouth at bedtime.  90 tablet  1  . valACYclovir (VALTREX) 1000 MG tablet Take 1,000 mg by mouth 2 (two) times daily.      Marland Kitchen DISCONTD: Fluticasone-Salmeterol (ADVAIR DISKUS) 250-50 MCG/DOSE AEPB Inhale 1 puff into the lungs 2 (two)  times daily.  60 each  3  . DISCONTD: sertraline (ZOLOFT)  50 MG tablet Take 1 tablet (50 mg total) by mouth daily.  30 tablet  1  . amoxicillin-clavulanate (AUGMENTIN) 875-125 MG per tablet Take 1 tablet by mouth 2 (two) times daily.  14 tablet  0  . mometasone (NASONEX) 50 MCG/ACT nasal spray Place 2 sprays into the nose daily.  17 g  6  . predniSONE (DELTASONE) 10 MG tablet Take 4 for two days three for two days two for two days one for two days  20 tablet  0

## 2012-04-15 NOTE — Telephone Encounter (Signed)
Noted  

## 2012-04-15 NOTE — Patient Instructions (Addendum)
Start nasonex two puff daily each nostril (use sample>>refill sent to local [facility] pharmacy)  To pharmacy downstairs we sent: Prednisone 10mg  Take 4 for two days three for two days two for two days one for two days Augmentin one twice daily for 7days  Stay on advair Return 6 weeks for recheck with Dr Vassie Loll

## 2012-04-15 NOTE — Telephone Encounter (Signed)
She is cutting the sertraline in half and her blood sugar is staying down.  She does not get to sleepas fast but her blood sugar is way better

## 2012-04-18 NOTE — Assessment & Plan Note (Signed)
Asthmatic bronchitis with flare   Plan Start nasonex two puff daily each nostril (use sample>>refill sent to local [facility] pharmacy) Prednisone 10mg  Take 4 for two days three for two days two for two days one for two days Augmentin one twice daily for 7days Stay on advair Return 6 weeks for recheck with Dr Vassie Loll

## 2012-04-19 ENCOUNTER — Telehealth: Payer: Self-pay | Admitting: Family

## 2012-04-19 NOTE — Telephone Encounter (Signed)
Refill- simvastatin 40mg  tab. Take one tablet (40mg  total) by mouth at bedtime. Qty 90 last fill 8.19.13

## 2012-04-20 MED ORDER — SIMVASTATIN 40 MG PO TABS: 40.0000 mg | ORAL_TABLET | Freq: Every day | ORAL | Status: DC

## 2012-04-20 NOTE — Telephone Encounter (Signed)
Refill sent.

## 2012-04-21 ENCOUNTER — Other Ambulatory Visit: Payer: Self-pay | Admitting: *Deleted

## 2012-04-21 MED ORDER — RANITIDINE HCL 150 MG PO CAPS: 150.0000 mg | ORAL_CAPSULE | Freq: Two times a day (BID) | ORAL | Status: DC

## 2012-04-21 NOTE — Telephone Encounter (Signed)
Refill sent to [facility] for ranitidine #180 x 1 refill.

## 2012-05-12 ENCOUNTER — Ambulatory Visit: Payer: BC Managed Care – PPO | Admitting: Family

## 2012-05-17 ENCOUNTER — Telehealth: Payer: Self-pay | Admitting: Family

## 2012-05-17 MED ORDER — METFORMIN HCL 1000 MG PO TABS: 1000.0000 mg | ORAL_TABLET | Freq: Two times a day (BID) | ORAL | Status: DC

## 2012-05-17 NOTE — Telephone Encounter (Signed)
Pharmacy comments:   Patient is requesting a prescription for a 90 days supply of metformin

## 2012-05-17 NOTE — Telephone Encounter (Signed)
Refill sent #60 x no refills.

## 2012-05-17 NOTE — Telephone Encounter (Signed)
Can discuss 90 day supply at follow up.

## 2012-05-19 ENCOUNTER — Encounter: Payer: Self-pay | Admitting: Family

## 2012-05-19 ENCOUNTER — Telehealth: Payer: Self-pay | Admitting: Internal Medicine

## 2012-05-19 ENCOUNTER — Ambulatory Visit (INDEPENDENT_AMBULATORY_CARE_PROVIDER_SITE_OTHER): Payer: BC Managed Care – PPO | Admitting: Family

## 2012-05-19 VITALS — BP 124/80 | HR 78 | Temp 97.9°F | Resp 16 | Ht 63.5 in | Wt 160.0 lb

## 2012-05-19 DIAGNOSIS — F341 Dysthymic disorder: Secondary | ICD-10-CM

## 2012-05-19 MED ORDER — SERTRALINE HCL 50 MG PO TABS: 25.0000 mg | ORAL_TABLET | Freq: Every day | ORAL | Status: DC

## 2012-05-19 NOTE — Telephone Encounter (Signed)
Patient reports that she has had an increase in her stress due to the death of her husband about 4 months ago.  She is having diarrhea despite the Lotronex daily.  She is asking can she increase her dose or do you have any additional recommendations?

## 2012-05-19 NOTE — Telephone Encounter (Signed)
She is on maximal dose so best she is evaluated in the office Be sure to ask about risk factors for C diff, other infection Order C diff PCR if diarrhea and recent antibiotics

## 2012-05-19 NOTE — Telephone Encounter (Signed)
Left message for patient to call back  

## 2012-05-19 NOTE — Assessment & Plan Note (Signed)
Currently stable on 25mg  of sertraline. Continue same. 15 minutes spent with the pt today.  >50% of this time was spent counseling pt on her grief/depression.

## 2012-05-19 NOTE — Progress Notes (Signed)
Subjective:    Patient ID: Crystal [name], female    DOB: 20-Jan-1951, 61 y.o.   MRN: [id number]  HPI  [name] is a 61 yr old female who presents today for follow up of her depression. She tells me that she is feeling well on the sertraline.  She denies nausea or somnolence. Reports that she feels that she is dealing well with the loss of her husband.  Reports a recent spiritual moment that provided her with great healing.   Review of Systems See HPI  Past Medical History  Diagnosis Date  . Hypertension   . Hyperlipemia   . Dyspepsia   . OSA (obstructive sleep apnea)   . Plantar fasciitis   . Depression   . Asthma     02/08/10 FEV1 1.98 (80%), s/p saba 2.22 l/m (90%).nml  . Anxiety   . 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 Fatty liver 12/19/2010  . Internal hemorrhoids   . Diverticulosis   . Allergy     dust, cock roach,grass,weeds,molds  . GERD (gastroesophageal reflux disease)   . Acute peptic ulcer of stomach     As a teenager  . IBS (irritable bowel syndrome)     History   Social History  . Marital Status: Married    Spouse Name: Crystal [name]    Number of Children: 4  . 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 Smoking status: Never Smoker   . Smokeless tobacco: Never Used  . Alcohol Use: No  . Drug Use: No  . Sexually Active: Not on file   Other Topics Concern  . Not on file   Social History Narrative   No caffeine drinks daily     Past Surgical History  Procedure Date  . Knee arthroscopy 1998    left knee  . Toe surgery 1995    right  . Nasal septum surgery   . Rhinoplasty   . Appendectomy     age 66  . Colonoscopy 06/03/2010, 08/25/2011    diverticulosis, internal and external hemorrhoids, random biopsies negative in 2013  . Gastrectomy     partial - ulcer as a teen    Family History  Problem Relation Age of Onset  . Diabetes Mother     type II  . Heart attack Mother 31  . Ulcers       bleeding gastric  . Colon cancer Sister   . Heart defect      child  . Other      perforated bowel, child    Allergies  Allergen Reactions  . Prochlorperazine Edisylate Other (See Comments)    coma for 3 days  . Tetanus Toxoid Other (See Comments)    convulsions    Current Outpatient Prescriptions on File Prior to Visit  Medication Sig Dispense Refill  . albuterol (PROAIR HFA) 108 (90 BASE) MCG/ACT inhaler Inhale 2 puffs into the lungs every 4 (four) hours as needed. Shortness of breath and wheezing  18 g  1  . alosetron (LOTRONEX) 1 MG tablet Take 1 tablet (1 mg total) by mouth 2 (two) times daily.  60 tablet  11  . Fluticasone-Salmeterol (ADVAIR DISKUS) 250-50 MCG/DOSE AEPB Inhale 1 puff into the lungs 2 (two) times daily.  60 each  6  . lisinopril-hydrochlorothiazide (PRINZIDE,ZESTORETIC) 20-12.5 MG per tablet Take 0.5 tablets by mouth daily.      Marland Kitchen loratadine (CLARITIN)  10 MG tablet Take 2 tablets by mouth daily.       . metFORMIN (GLUCOPHAGE) 1000 MG tablet Take 1 tablet (1,000 mg total) by mouth 2 (two) times daily with a meal.  60 tablet  0  . mometasone (NASONEX) 50 MCG/ACT nasal spray Place 2 sprays into the nose daily.  17 g  6  . Multiple Vitamins-Minerals (CENTRUM SILVER PO) Take 1 tablet by mouth daily.        . Probiotic Product (ALIGN) 4 MG CAPS Take 1 capsule by mouth daily.        . ranitidine (ZANTAC) 150 MG capsule Take 1 capsule (150 mg total) by mouth 2 (two) times daily.  180 capsule  1  . simvastatin (ZOCOR) 40 MG tablet Take 1 tablet (40 mg total) by mouth at bedtime.  90 tablet  1  . valACYclovir (VALTREX) 1000 MG tablet Take 1,000 mg by mouth 2 (two) times daily.      . [DISCONTINUED] sertraline (ZOLOFT) 50 MG tablet Take 25 mg by mouth daily.      . predniSONE (DELTASONE) 10 MG tablet Take 4 for two days three for two days two for two days one for two days  20 tablet  0    BP 124/80  Pulse 78  Temp 97.9 F (36.6 C) (Oral)  Resp 16  Ht 5' 3.5"  (1.613 m)  Wt 160 lb (72.576 kg)  BMI 27.90 kg/m2  SpO2 98%       Objective:   Physical Exam  Constitutional: She appears well-developed and well-nourished. No distress.  Psychiatric: She has a normal mood and affect. Her behavior is normal. Judgment and thought content normal.          Assessment & Plan:

## 2012-05-19 NOTE — Patient Instructions (Addendum)
Please follow up in the end of December.

## 2012-05-21 NOTE — Telephone Encounter (Signed)
Patient advised of Dr. Marvell Fuller recommendations.  She will come on 05/27/12 3:30 due to restrictions with her work schedule.

## 2012-05-21 NOTE — Telephone Encounter (Signed)
Left message for patient to call back  

## 2012-05-26 ENCOUNTER — Emergency Department (HOSPITAL_BASED_OUTPATIENT_CLINIC_OR_DEPARTMENT_OTHER)
Admission: EM | Admit: 2012-05-26 | Discharge: 2012-05-26 | Disposition: A | Discharge status: Home / Self Care | Payer: BC Managed Care – PPO | Attending: Emergency Medicine | Admitting: Emergency Medicine

## 2012-05-26 ENCOUNTER — Encounter (HOSPITAL_BASED_OUTPATIENT_CLINIC_OR_DEPARTMENT_OTHER): Payer: Self-pay | Admitting: *Deleted

## 2012-05-26 DIAGNOSIS — E119 Type 2 diabetes mellitus without complications: Secondary | ICD-10-CM | POA: Insufficient documentation

## 2012-05-26 DIAGNOSIS — K7689 Other specified diseases of liver: Secondary | ICD-10-CM | POA: Insufficient documentation

## 2012-05-26 DIAGNOSIS — K219 Gastro-esophageal reflux disease without esophagitis: Secondary | ICD-10-CM | POA: Insufficient documentation

## 2012-05-26 DIAGNOSIS — E785 Hyperlipidemia, unspecified: Secondary | ICD-10-CM | POA: Insufficient documentation

## 2012-05-26 DIAGNOSIS — Z79899 Other long term (current) drug therapy: Secondary | ICD-10-CM | POA: Insufficient documentation

## 2012-05-26 DIAGNOSIS — Z8739 Personal history of other diseases of the musculoskeletal system and connective tissue: Secondary | ICD-10-CM | POA: Insufficient documentation

## 2012-05-26 DIAGNOSIS — F3289 Other specified depressive episodes: Secondary | ICD-10-CM | POA: Insufficient documentation

## 2012-05-26 DIAGNOSIS — K648 Other hemorrhoids: Secondary | ICD-10-CM | POA: Insufficient documentation

## 2012-05-26 DIAGNOSIS — K589 Irritable bowel syndrome without diarrhea: Secondary | ICD-10-CM | POA: Insufficient documentation

## 2012-05-26 DIAGNOSIS — B354 Tinea corporis: Secondary | ICD-10-CM

## 2012-05-26 DIAGNOSIS — G4730 Sleep apnea, unspecified: Secondary | ICD-10-CM | POA: Insufficient documentation

## 2012-05-26 DIAGNOSIS — R110 Nausea: Secondary | ICD-10-CM | POA: Insufficient documentation

## 2012-05-26 DIAGNOSIS — R197 Diarrhea, unspecified: Secondary | ICD-10-CM | POA: Insufficient documentation

## 2012-05-26 DIAGNOSIS — R21 Rash and other nonspecific skin eruption: Secondary | ICD-10-CM | POA: Insufficient documentation

## 2012-05-26 DIAGNOSIS — Z8719 Personal history of other diseases of the digestive system: Secondary | ICD-10-CM | POA: Insufficient documentation

## 2012-05-26 DIAGNOSIS — F411 Generalized anxiety disorder: Secondary | ICD-10-CM | POA: Insufficient documentation

## 2012-05-26 DIAGNOSIS — F329 Major depressive disorder, single episode, unspecified: Secondary | ICD-10-CM | POA: Insufficient documentation

## 2012-05-26 DIAGNOSIS — J45909 Unspecified asthma, uncomplicated: Secondary | ICD-10-CM | POA: Insufficient documentation

## 2012-05-26 MED ORDER — CLOTRIMAZOLE 1 % EX CREA: TOPICAL_CREAM | CUTANEOUS | Status: DC

## 2012-05-26 NOTE — ED Notes (Signed)
Pt c/o navel pain and nausea x 3 days

## 2012-05-26 NOTE — ED Provider Notes (Signed)
History     CSN: [id number]  Arrival date & time 05/26/12  1703   First MD Initiated Contact with Patient 05/26/12 1721      Chief Complaint  Patient presents with  . Abdominal Pain  . Nausea    (Consider location/radiation/quality/duration/timing/severity/associated sxs/prior treatment) HPI Pt presents with inner naval redness and burning and d/c noted earlier today. No abd pain. She has severe IBS and baseline nausea and diarrhea. Followed by Dr Leone Payor. No changes in basal symptoms. No fever chills, trauma.  Past Medical History  Diagnosis Date  . Hypertension   . Hyperlipemia   . Dyspepsia   . OSA (obstructive sleep apnea)   . Plantar fasciitis   . Depression   . Asthma     02/08/10 FEV1 1.98 (80%), s/p saba 2.22 l/m (90%).nml  . Anxiety   . 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 Fatty liver 12/19/2010  . Internal hemorrhoids   . Diverticulosis   . Allergy     dust, cock roach,grass,weeds,molds  . GERD (gastroesophageal reflux disease)   . Acute peptic ulcer of stomach     As a teenager  . IBS (irritable bowel syndrome)     Past Surgical History  Procedure Date  . Knee arthroscopy 1998    left knee  . Toe surgery 1995    right  . Nasal septum surgery   . Rhinoplasty   . Appendectomy     age 48  . Colonoscopy 06/03/2010, 08/25/2011    diverticulosis, internal and external hemorrhoids, random biopsies negative in 2013  . Gastrectomy     partial - ulcer as a teen    Family History  Problem Relation Age of Onset  . Diabetes Mother     type II  . Heart attack Mother 52  . Ulcers      bleeding gastric  . Colon cancer Sister   . Heart defect      child  . Other      perforated bowel, child    History  Substance Use Topics  . Smoking status: Never Smoker   . Smokeless tobacco: Never Used  . Alcohol Use: No    OB History    Grav Para Term Preterm Abortions TAB SAB Ect Mult Living   4 4             Obstetric Comments   1 child died at 6 weeks  (heart defects, perforated bowel)      Review of Systems  Constitutional: Negative for fever, chills, activity change, appetite change and unexpected weight change.  Gastrointestinal: Positive for nausea and diarrhea. Negative for vomiting and abdominal pain.  Genitourinary: Negative for dysuria and flank pain.  Skin: Positive for color change and rash.    Allergies  Prochlorperazine edisylate and Tetanus toxoid  Home Medications   Current Outpatient Rx  Name  Route  Sig  Dispense  Refill  . ALBUTEROL SULFATE HFA 108 (90 BASE) MCG/ACT IN AERS   Inhalation   Inhale 2 puffs into the lungs every 4 (four) hours as needed. Shortness of breath and wheezing   18 g   1   . ALOSETRON HCL 1 MG PO TABS   Oral   Take 1 tablet (1 mg total) by mouth 2 (two) times daily.   60 tablet   11   . CLOTRIMAZOLE 1 % EX CREA      Apply to affected area 2 times daily x 2 weeks  15 g   0   . FLUTICASONE-SALMETEROL 250-50 MCG/DOSE IN AEPB   Inhalation   Inhale 1 puff into the lungs 2 (two) times daily.   60 each   6   . LISINOPRIL-HYDROCHLOROTHIAZIDE 20-12.5 MG PO TABS   Oral   Take 0.5 tablets by mouth daily.         Marland Kitchen LORATADINE 10 MG PO TABS      Take 2 tablets by mouth daily.          Marland Kitchen METFORMIN HCL 1000 MG PO TABS   Oral   Take 1 tablet (1,000 mg total) by mouth 2 (two) times daily with a meal.   60 tablet   0   . MOMETASONE FUROATE 50 MCG/ACT NA SUSP   Nasal   Place 2 sprays into the nose daily.   17 g   6   . CENTRUM SILVER PO   Oral   Take 1 tablet by mouth daily.           Marland Kitchen PREDNISONE 10 MG PO TABS      Take 4 for two days three for two days two for two days one for two days   20 tablet   0   . ALIGN 4 MG PO CAPS   Oral   Take 1 capsule by mouth daily.           Marland Kitchen RANITIDINE HCL 150 MG PO CAPS   Oral   Take 1 capsule (150 mg total) by mouth 2 (two) times daily.   180 capsule   1   . SERTRALINE HCL 50 MG PO TABS   Oral   Take 0.5 tablets (25  mg total) by mouth daily.   30 tablet   1   . SIMVASTATIN 40 MG PO TABS   Oral   Take 1 tablet (40 mg total) by mouth at bedtime.   90 tablet   1   . VALACYCLOVIR HCL 1 G PO TABS   Oral   Take 1,000 mg by mouth 2 (two) times daily.           BP 119/79  Pulse 84  Temp 97.8 F (36.6 C) (Oral)  Resp 16  Ht 5\' 3"  (1.6 m)  Wt 159 lb (72.122 kg)  BMI 28.17 kg/m2  SpO2 100%  Physical Exam  Nursing note and vitals reviewed. Constitutional: She is oriented to person, place, and time. She appears well-developed and well-nourished. No distress.  HENT:  Head: Normocephalic and atraumatic.  Mouth/Throat: Oropharynx is clear and moist.  Neck: Normal range of motion. Neck supple.  Pulmonary/Chest: Effort normal.  Abdominal: Soft. Bowel sounds are normal. She exhibits no distension and no mass. There is no tenderness. There is no rebound and no guarding.  Musculoskeletal: Normal range of motion. She exhibits no edema and no tenderness.  Neurological: She is alert and oriented to person, place, and time.  Skin: Skin is warm and dry. No rash noted. There is erythema.       Naval is cavitary with erythematous papular rash in folds of skin. No mass palpated. Unable to express d/c.   Psychiatric: She has a normal mood and affect. Her behavior is normal.    ED Course  Procedures (including critical care time)  Labs Reviewed - No data to display No results found.   1. Tinea corporis       MDM  Bedside US of the area demonstrated no fluid collections or tracts. Given appearance  and recent steroid use, suspect tinea. Will treat with topical ointment. Pt to f/u with PMD. Pt is in agreement with plan. Return for worsening redness, fever, chills, pain or any concerns        Loren Racer, MD 05/26/12 1745

## 2012-05-27 ENCOUNTER — Ambulatory Visit: Payer: BC Managed Care – PPO | Admitting: Nurse Practitioner

## 2012-06-01 ENCOUNTER — Ambulatory Visit: Payer: BC Managed Care – PPO | Admitting: Pulmonary Disease

## 2012-06-03 ENCOUNTER — Ambulatory Visit (INDEPENDENT_AMBULATORY_CARE_PROVIDER_SITE_OTHER): Payer: BC Managed Care – PPO | Admitting: Pulmonary Disease

## 2012-06-03 ENCOUNTER — Encounter: Payer: Self-pay | Admitting: Pulmonary Disease

## 2012-06-03 VITALS — BP 124/70 | HR 71 | Ht 63.0 in | Wt 163.8 lb

## 2012-06-03 DIAGNOSIS — J45909 Unspecified asthma, uncomplicated: Secondary | ICD-10-CM

## 2012-06-03 DIAGNOSIS — G4733 Obstructive sleep apnea (adult) (pediatric): Secondary | ICD-10-CM

## 2012-06-03 NOTE — Patient Instructions (Signed)
Lung function appears ok Stay on advair for now Rx for full face mask will be sent to new DME

## 2012-06-03 NOTE — Assessment & Plan Note (Signed)
Good lung function on spirometry today Again, not convinced this is true asthma - may be allergies or upper airway induced  Keep her on advair for now & if no symptoms , consider stepdown to inhaled steroid alone on next visit.

## 2012-06-03 NOTE — Assessment & Plan Note (Signed)
Severe OSA Will refer her to new DME in [facility] for new face mask & obtain download  Weight loss encouraged, compliance with goal of at least 4-6 hrs every night is the expectation. Advised against medications with sedative side effects Cautioned against driving when sleepy - understanding that sleepiness will vary on a day to day basis

## 2012-06-03 NOTE — Progress Notes (Signed)
  Subjective:    Patient ID: Crystal [name], female    DOB: March 10, 1951, 61 y.o.   MRN: [id number]  HPI 61 yo female with known hx of HTN, DM and Hyperlipidmia for FU of asthma & OSA.   initial pulmonary consult on June 18, 2010 for evaluation of uncontrolled ' asthma. '   June 18, 2010--DOE x 4 mnths. she was hospitalized at [facility] 4 months ago after she was at work, went walking for exercise came back felt very short of breath, had chest pain and says she passed out x 2, Says she had breathing test that showed she had asthma. Was referred to [facility] where she was started on [facility] but has had no improvement and use proair 3 x She works fulltime in [employer], no change in house. has 1 indoor pet no unusual hobbies. No known hx of asthma in past or childhood. No family hx of Asthma. She does have sinus congestion, drainage intermittent and does have some intermittent GERD despite PPI daily.   July 02, 2010  Prefers advair to [place], Much improved ? due to advair or veramyst  Reviewed spirometry >> Baseline shows restriction with FEV1 80% (but nml ratio), improved to 102 % post BD.  Stress test nml  Reviewed records from [facility] >>AHI 37 corrected by CPAP 11 cm, oronasal mask ([facility] resp) - c/o nasal iritation  RAST neg- IgE 23     06/03/2012 Her husband of 58 y passed away due to metastatic melanoma Seen by PW 1 month ago  - prednisone dosepak On nasonex Dog ate her mask -wants to switch DME to local -needs copy of PSG Compliant with advair, singulair has dropped off her med list No obvious heatburn or post nasal drip Spirometry showed nml lung function-fev1 100%  Review of Systems   neg for any significant sore throat, dysphagia, itching, sneezing, nasal congestion or excess/ purulent secretions, fever, chills, sweats, unintended wt loss, pleuritic or exertional cp, hempoptysis, orthopnea pnd or change in chronic leg swelling. Also  denies presyncope, palpitations, heartburn, abdominal pain, nausea, vomiting, diarrhea or change in bowel or urinary habits, dysuria,hematuria, rash, arthralgias, visual complaints, headache, numbness weakness or ataxia.     Objective:   Physical Exam  Gen. Pleasant, well-nourished, in no distress ENT - no lesions, no post nasal drip Neck: No JVD, no thyromegaly, no carotid bruits Lungs: no use of accessory muscles, no dullness to percussion, clear without rales or rhonchi  Cardiovascular: Rhythm regular, heart sounds  normal, no murmurs or gallops, no peripheral edema Musculoskeletal: No deformities, no cyanosis or clubbing        Assessment & Plan:

## 2012-06-09 ENCOUNTER — Ambulatory Visit: Payer: BC Managed Care – PPO | Admitting: Physician Assistant

## 2012-06-12 ENCOUNTER — Other Ambulatory Visit: Payer: Self-pay | Admitting: Physician Assistant

## 2012-06-15 NOTE — Telephone Encounter (Signed)
Send this

## 2012-06-18 ENCOUNTER — Telehealth: Payer: Self-pay | Admitting: Family

## 2012-06-18 MED ORDER — SERTRALINE HCL 50 MG PO TABS: 25.0000 mg | ORAL_TABLET | Freq: Every day | ORAL | Status: DC

## 2012-06-18 MED ORDER — LISINOPRIL-HYDROCHLOROTHIAZIDE 20-12.5 MG PO TABS: 0.5000 | ORAL_TABLET | Freq: Every day | ORAL | Status: DC

## 2012-06-18 MED ORDER — METFORMIN HCL 1000 MG PO TABS: 1000.0000 mg | ORAL_TABLET | Freq: Two times a day (BID) | ORAL | Status: DC

## 2012-06-18 NOTE — Telephone Encounter (Signed)
Refill-metformin 1,000mg  tab. Take one tablet(1,000mg  total) by mouth two times daily with a meal. Qty 60 last fill 11.4.13  Refill- lisino hctz 20-12.5mg  tab. Take one tablet by mouth one time daily. Qty 90 last fill 9.18.13  Refill- sertraline 50mg  tab. Take one tablet by mouth one time daily. Qty 30 last fill 10.26.13

## 2012-06-18 NOTE — Telephone Encounter (Signed)
Refills sent to pharmacy. 

## 2012-06-21 ENCOUNTER — Telehealth: Payer: Self-pay | Admitting: *Deleted

## 2012-06-21 NOTE — Telephone Encounter (Signed)
Spoke with pt. She states she has been having nausea again that she thinks is related to her IBS. She thinks Dr Leone Payor prescribed this for her in the past. Advised pt to have pharmacy send request to him and she voices understanding.

## 2012-06-21 NOTE — Telephone Encounter (Signed)
Patient returned phone call. She states that she is at work and will be on break again at 2pm.

## 2012-06-21 NOTE — Telephone Encounter (Signed)
Received refill request from [facility] for Ondansetron 4mg . Attempted to reach pt re: need for medication and left message for her to return my call.

## 2012-06-24 ENCOUNTER — Other Ambulatory Visit: Payer: Self-pay

## 2012-06-24 MED ORDER — ONDANSETRON HCL 4 MG PO TABS: 4.0000 mg | ORAL_TABLET | Freq: Three times a day (TID) | ORAL | Status: DC | PRN

## 2012-06-24 NOTE — Telephone Encounter (Signed)
Refill ok per Dr. Stan Head.  She will need to come back in April if not before to see [place].  Called [facility] in [place] and cancelled Rx, sent new rx for generic zofran to [facility] in [place], e-scribed it.

## 2012-06-25 ENCOUNTER — Encounter: Payer: BC Managed Care – PPO | Admitting: Internal Medicine

## 2012-07-01 ENCOUNTER — Ambulatory Visit: Payer: BC Managed Care – PPO | Admitting: Pulmonary Disease

## 2012-07-02 ENCOUNTER — Ambulatory Visit (INDEPENDENT_AMBULATORY_CARE_PROVIDER_SITE_OTHER): Payer: BC Managed Care – PPO | Admitting: Family

## 2012-07-02 ENCOUNTER — Encounter: Payer: Self-pay | Admitting: Family

## 2012-07-02 VITALS — BP 120/80 | HR 79 | Temp 97.6°F | Resp 16 | Ht 63.5 in | Wt 160.1 lb

## 2012-07-02 DIAGNOSIS — L0291 Cutaneous abscess, unspecified: Secondary | ICD-10-CM

## 2012-07-02 DIAGNOSIS — L0390 Cellulitis, unspecified: Secondary | ICD-10-CM | POA: Insufficient documentation

## 2012-07-02 DIAGNOSIS — F341 Dysthymic disorder: Secondary | ICD-10-CM

## 2012-07-02 DIAGNOSIS — E1149 Type 2 diabetes mellitus with other diabetic neurological complication: Secondary | ICD-10-CM

## 2012-07-02 DIAGNOSIS — E1142 Type 2 diabetes mellitus with diabetic polyneuropathy: Secondary | ICD-10-CM

## 2012-07-02 LAB — BASIC METABOLIC PANEL
CO2: 23 mEq/L (ref 19–32)
Calcium: 9.6 mg/dL (ref 8.4–10.5)
Chloride: 104 mEq/L (ref 96–112)
Glucose, Bld: 140 mg/dL — ABNORMAL HIGH (ref 70–99)
Sodium: 139 mEq/L (ref 135–145)

## 2012-07-02 MED ORDER — CEPHALEXIN 500 MG PO CAPS: 500.0000 mg | ORAL_CAPSULE | Freq: Four times a day (QID) | ORAL | Status: AC

## 2012-07-02 NOTE — Assessment & Plan Note (Signed)
Dur for A1C.  Obtain A1C and bmet. Continue metformin.

## 2012-07-02 NOTE — Progress Notes (Signed)
Subjective:    Patient ID: Crystal [name], female    DOB: 12-16-1950, 61 y.o.   MRN: [id number]  HPI  [name] is a 61 yr old female who presents today for follow up of her depression.  She lost her husband a few months back. She continues sertraline 25mg  once daily.  Skin problem- notes redness/oozing from [employer] area.  Reports that it oozes a foul smelling  Liquid.  This has been bothering her off/on x 2-3 months.    DM2- checking her blood sugar about 2x a day.  She reports that she has been running 131 post prandial. AM 125-130.  She continues metformin.   Review of Systems    see HPI  Past Medical History  Diagnosis Date  . Hypertension   . Hyperlipemia   . Dyspepsia   . OSA (obstructive sleep apnea)   . Plantar fasciitis   . Depression   . Asthma     02/08/10 FEV1 1.98 (80%), s/p saba 2.22 l/m (90%).nml  . Anxiety   . 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 Fatty liver 12/19/2010  . Internal hemorrhoids   . Diverticulosis   . Allergy     dust, cock roach,grass,weeds,molds  . GERD (gastroesophageal reflux disease)   . Acute peptic ulcer of stomach     As a teenager  . IBS (irritable bowel syndrome)     History   Social History  . Marital Status: Married    Spouse Name: Crystal [name]    Number of Children: 4  . 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 Smoking status: Never Smoker   . Smokeless tobacco: Never Used  . Alcohol Use: No  . Drug Use: No  . Sexually Active: Not on file   Other Topics Concern  . Not on file   Social History Narrative   No caffeine drinks daily     Past Surgical History  Procedure Date  . Knee arthroscopy 1998    left knee  . Toe surgery 1995    right  . Nasal septum surgery   . Rhinoplasty   . Appendectomy     age 28  . Colonoscopy 06/03/2010, 08/25/2011    diverticulosis, internal and external hemorrhoids, random biopsies negative in 2013  . Gastrectomy    partial - ulcer as a teen    Family History  Problem Relation Age of Onset  . Diabetes Mother     type II  . Heart attack Mother 38  . Ulcers      bleeding gastric  . Colon cancer Sister   . Heart defect      child  . Other      perforated bowel, child    Allergies  Allergen Reactions  . Prochlorperazine Edisylate Other (See Comments)    coma for 3 days  . Tetanus Toxoid Other (See Comments)    convulsions    Current Outpatient Prescriptions on File Prior to Visit  Medication Sig Dispense Refill  . albuterol (PROAIR HFA) 108 (90 BASE) MCG/ACT inhaler Inhale 2 puffs into the lungs every 4 (four) hours as needed. Shortness of breath and wheezing  18 g  1  . alosetron (LOTRONEX) 1 MG tablet Take 1 tablet (1 mg total) by mouth 2 (two) times daily.  60 tablet  11  . clotrimazole (LOTRIMIN) 1 % cream Apply to affected area 2 times daily x 2  weeks  15 g  0  . Fluticasone-Salmeterol (ADVAIR DISKUS) 250-50 MCG/DOSE AEPB Inhale 1 puff into the lungs 2 (two) times daily.  60 each  6  . lisinopril-hydrochlorothiazide (PRINZIDE,ZESTORETIC) 20-12.5 MG per tablet Take 0.5 tablets by mouth daily.  15 tablet  0  . metFORMIN (GLUCOPHAGE) 1000 MG tablet Take 1 tablet (1,000 mg total) by mouth 2 (two) times daily with a meal.  60 tablet  0  . mometasone (NASONEX) 50 MCG/ACT nasal spray Place 2 sprays into the nose daily.  17 g  6  . Multiple Vitamins-Minerals (CENTRUM SILVER PO) Take 1 tablet by mouth daily.        . ondansetron (ZOFRAN) 4 MG tablet Take 1 tablet (4 mg total) by mouth every 8 (eight) hours as needed for nausea.  60 tablet  1  . Probiotic Product (ALIGN) 4 MG CAPS Take 1 capsule by mouth daily.        . ranitidine (ZANTAC) 150 MG capsule Take 1 capsule (150 mg total) by mouth 2 (two) times daily.  180 capsule  1  . sertraline (ZOLOFT) 50 MG tablet Take 0.5 tablets (25 mg total) by mouth daily.  15 tablet  0  . simvastatin (ZOCOR) 40 MG tablet Take 1 tablet (40 mg total) by mouth at  bedtime.  90 tablet  1  . valACYclovir (VALTREX) 1000 MG tablet Take 1,000 mg by mouth 2 (two) times daily.      Marland Kitchen loratadine (CLARITIN) 10 MG tablet Take 2 tablets by mouth daily as needed..        BP 120/80  Pulse 79  Temp 97.6 F (36.4 C) (Oral)  Resp 16  Ht 5' 3.5" (1.613 m)  Wt 160 lb 1.9 oz (72.63 kg)  BMI 27.92 kg/m2  SpO2 99%    Objective:   Physical Exam  Constitutional: She is oriented to person, place, and time. She appears well-developed and well-nourished. No distress.  Cardiovascular: Normal rate and regular rhythm.   No murmur heard. Pulmonary/Chest: Effort normal and breath sounds normal. No respiratory distress. She has no wheezes. She has no rales. She exhibits no tenderness.  Neurological: She is alert and oriented to person, place, and time.  Psychiatric: She has a normal mood and affect. Her behavior is normal. Judgment and thought content normal.  skin- umbilicus- no redness or oozing is noted today.        Assessment & Plan:

## 2012-07-02 NOTE — Assessment & Plan Note (Signed)
Looks ok today, but suspect mild cellulitis.  Rx with keflex, call if recurrent oozing/redness occurs.

## 2012-07-02 NOTE — Patient Instructions (Addendum)
Please complete your blood work prior to leaving.  Follow up in 3 months, sooner if problems or concerns.

## 2012-07-02 NOTE — Assessment & Plan Note (Signed)
Stable, continue sertraline.

## 2012-07-03 ENCOUNTER — Encounter: Payer: Self-pay | Admitting: Family

## 2012-07-15 ENCOUNTER — Ambulatory Visit: Payer: BC Managed Care – PPO | Admitting: Internal Medicine

## 2012-07-29 ENCOUNTER — Telehealth: Payer: Self-pay | Admitting: Family

## 2012-07-29 NOTE — Telephone Encounter (Signed)
I do not see where this patient has used either items requested prior/SLS

## 2012-07-29 NOTE — Telephone Encounter (Signed)
Patient would like a refill of needles and test strips to be sent to [facility] pharmacy

## 2012-07-30 NOTE — Telephone Encounter (Signed)
Attempted to reach pt, no answer at home and unable to leave message on cell# as voice mailbox in full.

## 2012-08-02 MED ORDER — GLUCOSE BLOOD VI STRP: ORAL_STRIP | Status: DC

## 2012-08-02 MED ORDER — ONETOUCH ULTRASOFT LANCETS MISC: Status: DC

## 2012-08-02 NOTE — Telephone Encounter (Signed)
Pt returned my call stating she currently uses One Touch ultra mini device. Rxs sent for onetouch ultra test strips (#50 x 2 refills) and lancets (#100 x 2 refills).

## 2012-08-02 NOTE — Telephone Encounter (Signed)
Left detailed message for pt to call and let us know the name of her current needles and test strips and how often does she test her blood sugar. Advised pt she can leave info on voicemail.

## 2012-08-05 ENCOUNTER — Telehealth: Payer: Self-pay | Admitting: *Deleted

## 2012-08-05 NOTE — Telephone Encounter (Signed)
Received message from pt stating pharmacy has faxed [place] twice with prior authorization request for her test strips. States she only has 10 left. Spoke Onalee Hua at [facility] and verified that insurance is asking for step therapy? Prior authorization. He states they faxed [place] twice. When I verified fax # it did not match what they had in their system. Correct fax # given to Onalee Hua and he will re-send authorization.

## 2012-08-06 ENCOUNTER — Encounter: Payer: Self-pay | Admitting: Family Medicine

## 2012-08-06 ENCOUNTER — Ambulatory Visit (HOSPITAL_BASED_OUTPATIENT_CLINIC_OR_DEPARTMENT_OTHER)
Admission: RE | Admit: 2012-08-06 | Discharge: 2012-08-06 | Disposition: A | Discharge status: Home / Self Care | Payer: BC Managed Care – PPO | Source: Ambulatory Visit | Attending: Family Medicine | Admitting: Family Medicine

## 2012-08-06 ENCOUNTER — Ambulatory Visit: Payer: BC Managed Care – PPO | Admitting: Internal Medicine

## 2012-08-06 ENCOUNTER — Ambulatory Visit (INDEPENDENT_AMBULATORY_CARE_PROVIDER_SITE_OTHER): Payer: BC Managed Care – PPO | Admitting: Family Medicine

## 2012-08-06 VITALS — BP 160/86 | HR 82 | Temp 97.6°F | Wt 160.0 lb

## 2012-08-06 DIAGNOSIS — S99929A Unspecified injury of unspecified foot, initial encounter: Secondary | ICD-10-CM | POA: Insufficient documentation

## 2012-08-06 DIAGNOSIS — S99919A Unspecified injury of unspecified ankle, initial encounter: Secondary | ICD-10-CM

## 2012-08-06 DIAGNOSIS — M79609 Pain in unspecified limb: Secondary | ICD-10-CM | POA: Insufficient documentation

## 2012-08-06 MED ORDER — HYDROCODONE-ACETAMINOPHEN 5-325 MG PO TABS: 1.0000 | ORAL_TABLET | Freq: Four times a day (QID) | ORAL | Status: AC | PRN

## 2012-08-06 MED ORDER — ACCU-CHEK MULTICLIX LANCETS MISC: Status: DC

## 2012-08-06 MED ORDER — GLUCOSE BLOOD VI STRP: ORAL_STRIP | Status: DC

## 2012-08-06 MED ORDER — ACCU-CHEK AVIVA PLUS W/DEVICE KIT: 1.0000 | PACK | Freq: Every day | Status: DC

## 2012-08-06 NOTE — Patient Instructions (Addendum)
[facility] to the [facility] and get your [facility] done Take the Vicodin as needed for pain Ice for 20 minutes out of the hour Hang in there!!

## 2012-08-06 NOTE — Progress Notes (Signed)
  Subjective:    Patient ID: Crystal [name], female    DOB: [birth year], 62 y.o.   MRN: [id number]  HPI Toe injury- tripped over dog's toy last night.  Now having pain and swelling of R 4th and 5th digits.  Some bruising of 5th toe.  Pain is improving.  Some discomfort w/ weight bearing.  Took tylenol this AM w/ some relief.   Review of Systems For ROS see HPI     Objective:   Physical Exam  Vitals reviewed. Constitutional: She appears well-developed and well-nourished. No distress.  Musculoskeletal:       Right foot: She exhibits decreased range of motion, tenderness, bony tenderness and swelling. She exhibits no crepitus and no deformity.       Feet:          Assessment & Plan:

## 2012-08-06 NOTE — Telephone Encounter (Signed)
Received forms from [facility] re: step therapy. Preferred alternatives are AccuChek, Accutrend, Breeze 2, Contour (Bayer and Roche products). Members may contact [phone] to request a free meter for Bayer products and [phone] to request a free Roche product. Notified pt and left Bayer number on cell #. Rxs sent to pharmacy for Accu Check Aviva Plus meter, lancets and test strips.

## 2012-08-08 NOTE — Assessment & Plan Note (Signed)
New.  Given severity of pt's pain, along w/ bruising and swelling of R 4th and 5th toes will get xray to assess for fx.  Pain meds prn.  Pt given post-op shoe to wear for comfort.

## 2012-08-09 ENCOUNTER — Telehealth: Payer: Self-pay | Admitting: *Deleted

## 2012-08-09 NOTE — Telephone Encounter (Signed)
Informed pt of recent toe x-ray results and note.  Pt understood and agreed.//AB/CMA

## 2012-08-09 NOTE — Telephone Encounter (Signed)
Notes Recorded by Verdene Rio, CMA on 08/09/2012 at 1:12 PM Left message to call office  ------  Notes Recorded by Sheliah Hatch, MD on 08/06/2012 at 7:29 PM No fracture seen- continue post op shoe until able to walk w/out pain. No need for ortho f/u

## 2012-08-11 ENCOUNTER — Telehealth: Payer: Self-pay | Admitting: Family

## 2012-08-11 MED ORDER — LISINOPRIL-HYDROCHLOROTHIAZIDE 20-12.5 MG PO TABS: 0.5000 | ORAL_TABLET | Freq: Every day | ORAL | Status: DC

## 2012-08-11 MED ORDER — SERTRALINE HCL 50 MG PO TABS: 25.0000 mg | ORAL_TABLET | Freq: Every day | ORAL | Status: DC

## 2012-08-11 MED ORDER — METFORMIN HCL 1000 MG PO TABS: 1000.0000 mg | ORAL_TABLET | Freq: Two times a day (BID) | ORAL | Status: DC

## 2012-08-11 NOTE — Telephone Encounter (Signed)
Refill- lisino-hctz 20-12.5 tab. Take 1/2 tablet daily. Qty 15 last fill 12.11.13  Refill- sertraline 50mg  tab. Take 1/2 tablet daily. Qty 15 last fill 12.11.13  Refill- metformin 1000mg  tab. Take one tablet by mouth twice a day with meals. Qty 60 last fill 12.11.13

## 2012-08-11 NOTE — Telephone Encounter (Signed)
Refills sent to pharmacy, 30 day supply x 2 refills each.

## 2012-08-21 ENCOUNTER — Ambulatory Visit (INDEPENDENT_AMBULATORY_CARE_PROVIDER_SITE_OTHER): Payer: BC Managed Care – PPO | Admitting: Family

## 2012-08-21 ENCOUNTER — Encounter: Payer: Self-pay | Admitting: Family

## 2012-08-21 VITALS — BP 124/82 | HR 91 | Temp 98.0°F | Resp 16 | Wt 156.4 lb

## 2012-08-21 DIAGNOSIS — IMO0002 Reserved for concepts with insufficient information to code with codable children: Secondary | ICD-10-CM

## 2012-08-21 DIAGNOSIS — S90821A Blister (nonthermal), right foot, initial encounter: Secondary | ICD-10-CM

## 2012-08-21 MED ORDER — CEPHALEXIN 500 MG PO CAPS: 500.0000 mg | ORAL_CAPSULE | Freq: Four times a day (QID) | ORAL | Status: AC

## 2012-08-21 NOTE — Patient Instructions (Addendum)
Keep a close eye on the area.  Call if increased redness, swelling drainage, or if no improvement by Tuesday.

## 2012-08-21 NOTE — Progress Notes (Signed)
Subjective:    Patient ID: Crystal [name], female    DOB: [birth year], 62 y.o.   MRN: [id number]  HPI  Crystal [name] is a 62 yr old female who presents today with chief complaint of tenderness on the bottom of her right foot. She denies known injury.   Review of Systems    see HPI  Past Medical History  Diagnosis Date  . Hypertension   . Hyperlipemia   . Dyspepsia   . OSA (obstructive sleep apnea)   . Plantar fasciitis   . Depression   . Asthma     02/08/10 FEV1 1.98 (80%), s/p saba 2.22 l/m (90%).nml  . Anxiety   . 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 Fatty liver 12/19/2010  . Internal hemorrhoids   . Diverticulosis   . Allergy     dust, cock roach,grass,weeds,molds  . GERD (gastroesophageal reflux disease)   . Acute peptic ulcer of stomach     As a teenager  . IBS (irritable bowel syndrome)     History   Social History  . Marital Status: Married    Spouse Name: Crystal [name]    Number of Children: 4  . Years of Education: N/A   Occupational History  . DATA ENTRY Crystal [name]   Social History Main Topics  . Smoking status: Never Smoker   . Smokeless tobacco: Never Used  . Alcohol Use: No  . Drug Use: No  . Sexually Active: Not on file   Other Topics Concern  . Not on file   Social History Narrative   No caffeine drinks daily     Past Surgical History  Procedure Laterality Date  . Knee arthroscopy  1998    left knee  . Toe surgery  1995    right  . Nasal septum surgery    . Rhinoplasty    . Appendectomy      age 13  . Colonoscopy  06/03/2010, 08/25/2011    diverticulosis, internal and external hemorrhoids, random biopsies negative in 2013  . Gastrectomy      partial - ulcer as a teen    Family History  Problem Relation Age of Onset  . Diabetes Mother     type II  . Heart attack Mother 45  . Ulcers      bleeding gastric  . Colon cancer Sister   . Heart defect      child  . Other      perforated bowel, child    Allergies  Allergen Reactions  .  Prochlorperazine Edisylate Other (See Comments)    coma for 3 days  . Tetanus Toxoid Other (See Comments)    convulsions    Current Outpatient Prescriptions on File Prior to Visit  Medication Sig Dispense Refill  . albuterol (PROAIR HFA) 108 (90 BASE) MCG/ACT inhaler Inhale 2 puffs into the lungs every 4 (four) hours as needed. Shortness of breath and wheezing  18 g  1  . alosetron (LOTRONEX) 1 MG tablet Take 1 tablet (1 mg total) by mouth 2 (two) times daily.  60 tablet  11  . Blood Glucose Monitoring Suppl (ACCU-CHEK AVIVA PLUS) W/DEVICE KIT 1 kit by Does not apply route daily. DX 250.00  1 kit  0  . clotrimazole (LOTRIMIN) 1 % cream Apply to affected area 2 times daily x 2 weeks  15 g  0  . Fluticasone-Salmeterol (ADVAIR DISKUS) 250-50 MCG/DOSE AEPB Inhale 1 puff into the lungs 2 (two) times daily.  60 each  6  . glucose blood (ACCU-CHEK AVIVA PLUS) test strip Use to check blood sugar once daily as instructed.  DX 250.00  50 each  2  . Lancets (ACCU-CHEK MULTICLIX) lancets Use to check blood sugar once daily as instructed.  DX 250.00  100 each  2  . lisinopril-hydrochlorothiazide (PRINZIDE,ZESTORETIC) 20-12.5 MG per tablet Take 0.5 tablets by mouth daily.  15 tablet  2  . metFORMIN (GLUCOPHAGE) 1000 MG tablet Take 1 tablet (1,000 mg total) by mouth 2 (two) times daily with a meal.  60 tablet  2  . mometasone (NASONEX) 50 MCG/ACT nasal spray Place 2 sprays into the nose daily.  17 g  6  . Multiple Vitamins-Minerals (CENTRUM SILVER PO) Take 1 tablet by mouth daily.        . ondansetron (ZOFRAN) 4 MG tablet Take 1 tablet (4 mg total) by mouth every 8 (eight) hours as needed for nausea.  60 tablet  1  . Probiotic Product (ALIGN) 4 MG CAPS Take 1 capsule by mouth daily.        . ranitidine (ZANTAC) 150 MG capsule Take 1 capsule (150 mg total) by mouth 2 (two) times daily.  180 capsule  1  . sertraline (ZOLOFT) 50 MG tablet Take 0.5 tablets (25 mg total) by mouth daily.  15 tablet  2  .  simvastatin (ZOCOR) 40 MG tablet Take 1 tablet (40 mg total) by mouth at bedtime.  90 tablet  1  . valACYclovir (VALTREX) 1000 MG tablet Take 1,000 mg by mouth 2 (two) times daily.       No current facility-administered medications on file prior to visit.    BP 124/82  Pulse 91  Temp(Src) 98 F (36.7 C) (Oral)  Resp 16  Wt 156 lb 6 oz (70.931 kg)  BMI 27.26 kg/m2  SpO2 98%    Objective:   Physical Exam  Constitutional: She appears well-developed and well-nourished. No distress.  Skin:  Small blister of blood noted on the dorsal aspect of the right foot.  Blood is noted in a linear splinter-like pattern          Assessment & Plan:

## 2012-08-23 DIAGNOSIS — S90821A Blister (nonthermal), right foot, initial encounter: Secondary | ICD-10-CM | POA: Insufficient documentation

## 2012-08-26 NOTE — Assessment & Plan Note (Signed)
Attempted to clean out wound for possible splinter with a 20 gauge needle tip. No splinter noted. Will rx empirically withbkeflex. Pt is instructed to soak foot bid in warm water. Call if increased redness or if area does not continue to improve. She verbalizes understanding.

## 2012-09-03 ENCOUNTER — Telehealth: Payer: Self-pay | Admitting: Family

## 2012-09-03 MED ORDER — VALACYCLOVIR HCL 500 MG PO TABS: ORAL_TABLET | ORAL | Status: DC

## 2012-09-03 NOTE — Telephone Encounter (Signed)
Refill sent, notified pt. 

## 2012-09-03 NOTE — Telephone Encounter (Signed)
Refill- valacyclovir 500mg  tab. 1 tablet by mouth twice daily x3 days at the start of outbreak. Qty 30 last fill 5.2.13

## 2012-10-01 ENCOUNTER — Ambulatory Visit (INDEPENDENT_AMBULATORY_CARE_PROVIDER_SITE_OTHER): Payer: BC Managed Care – PPO | Admitting: Family

## 2012-10-01 ENCOUNTER — Encounter: Payer: Self-pay | Admitting: Family

## 2012-10-01 VITALS — BP 120/84 | HR 87 | Temp 97.9°F | Resp 16 | Ht 63.5 in | Wt 156.1 lb

## 2012-10-01 DIAGNOSIS — E119 Type 2 diabetes mellitus without complications: Secondary | ICD-10-CM

## 2012-10-01 DIAGNOSIS — E1149 Type 2 diabetes mellitus with other diabetic neurological complication: Secondary | ICD-10-CM

## 2012-10-01 DIAGNOSIS — E1142 Type 2 diabetes mellitus with diabetic polyneuropathy: Secondary | ICD-10-CM

## 2012-10-01 DIAGNOSIS — M79609 Pain in unspecified limb: Secondary | ICD-10-CM

## 2012-10-01 DIAGNOSIS — E785 Hyperlipidemia, unspecified: Secondary | ICD-10-CM

## 2012-10-01 DIAGNOSIS — F341 Dysthymic disorder: Secondary | ICD-10-CM

## 2012-10-01 DIAGNOSIS — M79646 Pain in unspecified finger(s): Secondary | ICD-10-CM | POA: Insufficient documentation

## 2012-10-01 LAB — HEPATIC FUNCTION PANEL
Bilirubin, Direct: 0.1 mg/dL (ref 0.0–0.3)
Indirect Bilirubin: 0.4 mg/dL (ref 0.0–0.9)
Total Bilirubin: 0.5 mg/dL (ref 0.3–1.2)

## 2012-10-01 LAB — HEMOGLOBIN A1C
Hgb A1c MFr Bld: 6.9 % — ABNORMAL HIGH (ref <5.7)
Mean Plasma Glucose: 151 mg/dL — ABNORMAL HIGH (ref <117)

## 2012-10-01 MED ORDER — SITAGLIPTIN PHOSPHATE 100 MG PO TABS: 100.0000 mg | ORAL_TABLET | Freq: Every day | ORAL | Status: DC

## 2012-10-01 NOTE — Assessment & Plan Note (Signed)
Refer to sports med 

## 2012-10-01 NOTE — Assessment & Plan Note (Signed)
Stop metformin due to diarrhea. Trial of Januvia.samples provided #14.

## 2012-10-01 NOTE — Assessment & Plan Note (Signed)
Stable. Attempt wean off of zoloft as outlined in avs.

## 2012-10-01 NOTE — Progress Notes (Signed)
Subjective:    Patient ID: Crystal [name], female    DOB: [birth year], 62 y.o.   MRN: [id number]  HPI  Crystal [name] is a 62 yr old female who presents today for follow up.  1) Depression- She is on zoloft 50mg . Reports that she is doing great. Wants to try to come off.  2) Diabetes-she is currently maintained on metformin.    3) IBS- notes diarrhea is a big problem, especially in AM. Was off all meds for a few days and diarrhea stopped.  Continues to have pain in bilateral base of thumb joints in both hands.   Review of Systems See HPI  Past Medical History  Diagnosis Date  . Hypertension   . Hyperlipemia   . Dyspepsia   . OSA (obstructive sleep apnea)   . Plantar fasciitis   . Depression   . Asthma     02/08/10 FEV1 1.98 (80%), s/p saba 2.22 l/m (90%).nml  . Anxiety   . 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 Fatty liver 12/19/2010  . Internal hemorrhoids   . Diverticulosis   . Allergy     dust, cock roach,grass,weeds,molds  . GERD (gastroesophageal reflux disease)   . Acute peptic ulcer of stomach     As a teenager  . IBS (irritable bowel syndrome)     History   Social History  . Marital Status: Married    Spouse Name: Crystal [name]    Number of Children: 4  . 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 Smoking status: Never Smoker   . Smokeless tobacco: Never Used  . Alcohol Use: No  . Drug Use: No  . Sexually Active: Not on file   Other Topics Concern  . Not on file   Social History Narrative   No caffeine drinks daily     Past Surgical History  Procedure Laterality Date  . Knee arthroscopy  1998    left knee  . Toe surgery  1995    right  . Nasal septum surgery    . Rhinoplasty    . Appendectomy      age 18  . Colonoscopy  06/03/2010, 08/25/2011    diverticulosis, internal and external hemorrhoids, random biopsies negative in 2013  . Gastrectomy      partial - ulcer as a teen    Family  History  Problem Relation Age of Onset  . Diabetes Mother     type II  . Heart attack Mother 67  . Ulcers      bleeding gastric  . Colon cancer Sister   . Heart defect      child  . Other      perforated bowel, child    Allergies  Allergen Reactions  . Prochlorperazine Edisylate Other (See Comments)    coma for 3 days  . Tetanus Toxoid Other (See Comments)    convulsions    Current Outpatient Prescriptions on File Prior to Visit  Medication Sig Dispense Refill  . albuterol (PROAIR HFA) 108 (90 BASE) MCG/ACT inhaler Inhale 2 puffs into the lungs every 4 (four) hours as needed. Shortness of breath and wheezing  18 g  1  . alosetron (LOTRONEX) 1 MG tablet Take 1 tablet (1 mg total) by mouth 2 (two) times daily.  60 tablet  11  . Blood Glucose Monitoring Suppl (ACCU-CHEK AVIVA PLUS) W/DEVICE KIT 1 kit by Does not apply route  daily. DX 250.00  1 kit  0  . clotrimazole (LOTRIMIN) 1 % cream Apply to affected area 2 times daily x 2 weeks  15 g  0  . Fluticasone-Salmeterol (ADVAIR DISKUS) 250-50 MCG/DOSE AEPB Inhale 1 puff into the lungs 2 (two) times daily.  60 each  6  . glucose blood (ACCU-CHEK AVIVA PLUS) test strip Use to check blood sugar once daily as instructed.  DX 250.00  50 each  2  . Lancets (ACCU-CHEK MULTICLIX) lancets Use to check blood sugar once daily as instructed.  DX 250.00  100 each  2  . lisinopril-hydrochlorothiazide (PRINZIDE,ZESTORETIC) 20-12.5 MG per tablet Take 0.5 tablets by mouth daily.  15 tablet  2  . mometasone (NASONEX) 50 MCG/ACT nasal spray Place 2 sprays into the nose daily.  17 g  6  . Multiple Vitamins-Minerals (CENTRUM SILVER PO) Take 1 tablet by mouth daily.        . ondansetron (ZOFRAN) 4 MG tablet Take 1 tablet (4 mg total) by mouth every 8 (eight) hours as needed for nausea.  60 tablet  1  . Probiotic Product (ALIGN) 4 MG CAPS Take 1 capsule by mouth daily.        . ranitidine (ZANTAC) 150 MG capsule Take 1 capsule (150 mg total) by mouth 2 (two)  times daily.  180 capsule  1  . sertraline (ZOLOFT) 50 MG tablet Take 0.5 tablets (25 mg total) by mouth daily.  15 tablet  2  . simvastatin (ZOCOR) 40 MG tablet Take 1 tablet (40 mg total) by mouth at bedtime.  90 tablet  1  . valACYclovir (VALTREX) 1000 MG tablet Take 1,000 mg by mouth 2 (two) times daily.      . valACYclovir (VALTREX) 500 MG tablet Take 1 tablet twice a day for 3 days at start of outbreak.  30 tablet  1   No current facility-administered medications on file prior to visit.    BP 120/84  Pulse 87  Temp(Src) 97.9 F (36.6 C) (Oral)  Resp 16  Ht 5' 3.5" (1.613 m)  Wt 156 lb 1.3 oz (70.797 kg)  BMI 27.21 kg/m2  SpO2 98%       Objective:   Physical Exam  Constitutional: She is oriented to person, place, and time. She appears well-developed and well-nourished. No distress.  Cardiovascular: Normal rate and regular rhythm.   No murmur heard. Pulmonary/Chest: Effort normal and breath sounds normal. No respiratory distress. She has no wheezes. She has no rales. She exhibits no tenderness.  Musculoskeletal: She exhibits no edema.  Lymphadenopathy:    She has no cervical adenopathy.  Neurological: She is alert and oriented to person, place, and time.  Psychiatric: She has a normal mood and affect. Her behavior is normal. Judgment and thought content normal.          Assessment & Plan:

## 2012-10-01 NOTE — Patient Instructions (Addendum)
Sertraline 50mg  tabs 1/2 tab daily x 2 weeks, then every other day x 2 weeks then stop. Follow up in 6 weeks. Call if depression symptoms worsen with taper. Stop metformin, start Januvia. Complete your lab work prior to leaving.  Follow up in 6 weeks.

## 2012-10-14 ENCOUNTER — Encounter: Payer: Self-pay | Admitting: Family Medicine

## 2012-10-14 ENCOUNTER — Ambulatory Visit (INDEPENDENT_AMBULATORY_CARE_PROVIDER_SITE_OTHER): Payer: BC Managed Care – PPO | Admitting: Family Medicine

## 2012-10-14 VITALS — BP 121/82 | HR 80 | Ht 63.0 in | Wt 156.0 lb

## 2012-10-14 DIAGNOSIS — M19049 Primary osteoarthritis, unspecified hand: Secondary | ICD-10-CM

## 2012-10-14 NOTE — Patient Instructions (Addendum)
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 Take tylenol 500mg  1-2 tabs three times a day for pain. If you can take anti-inflammatories take aleve 2 tabs twice a day with food for pain and inflammation. Glucosamine sulfate 750mg  twice a day is a supplement that may help moderate to severe arthritis. Capsaicin topically up to four times a day may also help with pain. Cortisone injections are an option - you were given one today in the right side. Thumb spica brace as much as possible to immobilize it to help with pain. Heat or ice 15 minutes at a time 3-4 times a day as needed to help with pain. Follow up as needed.

## 2012-10-15 ENCOUNTER — Encounter: Payer: Self-pay | Admitting: Family Medicine

## 2012-10-15 DIAGNOSIS — M19041 Primary osteoarthritis, right hand: Secondary | ICD-10-CM | POA: Insufficient documentation

## 2012-10-15 NOTE — Progress Notes (Signed)
Subjective:    Patient ID: Crystal [name], female    DOB: [birth year], 62 y.o.   MRN: [id number]  PCP: Sandford Craze  HPI 62 yo F here for bilateral thumb pain.  Patient reports for about a year she has had pain at base of her thumbs. Has really intensified past few months, now very difficult to function. Pain radiates into thumb past MCP and into index finger. No numbness/tingling. Taking tylenol and using activeon. Works at a computer 8 hours a day, lots of typing. No prior treatment for this.  Not bracing.  Past Medical History  Diagnosis Date  . Hypertension   . Hyperlipemia   . Dyspepsia   . OSA (obstructive sleep apnea)   . Plantar fasciitis   . Depression   . Asthma     02/08/10 FEV1 1.98 (80%), s/p saba 2.22 l/m (90%).nml  . Anxiety   . 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 Fatty liver 12/19/2010  . Internal hemorrhoids   . Diverticulosis   . Allergy     dust, cock roach,grass,weeds,molds  . GERD (gastroesophageal reflux disease)   . Acute peptic ulcer of stomach     As a teenager  . IBS (irritable bowel syndrome)     Current Outpatient Prescriptions on File Prior to Visit  Medication Sig Dispense Refill  . albuterol (PROAIR HFA) 108 (90 BASE) MCG/ACT inhaler Inhale 2 puffs into the lungs every 4 (four) hours as needed. Shortness of breath and wheezing  18 g  1  . alosetron (LOTRONEX) 1 MG tablet Take 1 tablet (1 mg total) by mouth 2 (two) times daily.  60 tablet  11  . aspirin 81 MG tablet Take 81 mg by mouth daily.      . Blood Glucose Monitoring Suppl (ACCU-CHEK AVIVA PLUS) W/DEVICE KIT 1 kit by Does not apply route daily. DX 250.00  1 kit  0  . clotrimazole (LOTRIMIN) 1 % cream Apply to affected area 2 times daily x 2 weeks  15 g  0  . Fluticasone-Salmeterol (ADVAIR DISKUS) 250-50 MCG/DOSE AEPB Inhale 1 puff into the lungs 2 (two) times daily.  60 each  6  . glucose blood (ACCU-CHEK AVIVA PLUS) test strip Use to check blood sugar once daily as instructed.   DX 250.00  50 each  2  . Lancets (ACCU-CHEK MULTICLIX) lancets Use to check blood sugar once daily as instructed.  DX 250.00  100 each  2  . lisinopril-hydrochlorothiazide (PRINZIDE,ZESTORETIC) 20-12.5 MG per tablet Take 0.5 tablets by mouth daily.  15 tablet  2  . mometasone (NASONEX) 50 MCG/ACT nasal spray Place 2 sprays into the nose daily.  17 g  6  . Multiple Vitamins-Minerals (CENTRUM SILVER PO) Take 1 tablet by mouth daily.        . ondansetron (ZOFRAN) 4 MG tablet Take 1 tablet (4 mg total) by mouth every 8 (eight) hours as needed for nausea.  60 tablet  1  . Probiotic Product (ALIGN) 4 MG CAPS Take 1 capsule by mouth daily.        . ranitidine (ZANTAC) 150 MG capsule Take 1 capsule (150 mg total) by mouth 2 (two) times daily.  180 capsule  1  . simvastatin (ZOCOR) 40 MG tablet Take 1 tablet (40 mg total) by mouth at bedtime.  90 tablet  1  . sitaGLIPtin (JANUVIA) 100 MG tablet Take 1 tablet (100 mg total) by mouth daily.  30 tablet  2  . valACYclovir (VALTREX)  1000 MG tablet Take 1,000 mg by mouth 2 (two) times daily.      . valACYclovir (VALTREX) 500 MG tablet Take 1 tablet twice a day for 3 days at start of outbreak.  30 tablet  1   No current facility-administered medications on file prior to visit.    Past Surgical History  Procedure Laterality Date  . Knee arthroscopy  1998    left knee  . Toe surgery  1995    right  . Nasal septum surgery    . Rhinoplasty    . Appendectomy      age 62  . Colonoscopy  06/03/2010, 08/25/2011    diverticulosis, internal and external hemorrhoids, random biopsies negative in 2013  . Gastrectomy      partial - ulcer as a teen    Allergies  Allergen Reactions  . Prochlorperazine Edisylate Other (See Comments)    coma for 3 days  . Tetanus Toxoid Other (See Comments)    convulsions    History   Social History  . Marital Status: Married    Spouse Name: [name]    Number of Children: 4  . Years of Education: N/A   Occupational  History  . [employer] ENTRY [employer]   Social History Main Topics  . Smoking status: Never Smoker   . Smokeless tobacco: Never Used  . Alcohol Use: No  . Drug Use: No  . Sexually Active: Not on file   Other Topics Concern  . Not on file   Social History Narrative   No caffeine drinks daily     Family History  Problem Relation Age of Onset  . Diabetes Mother     type II  . Heart attack Mother 33  . Ulcers      bleeding gastric  . Colon cancer Sister   . Heart defect      child  . Other      perforated bowel, child    BP 121/82  Pulse 80  Ht 5\' 3"  (1.6 m)  Wt 156 lb (70.761 kg)  BMI 27.64 kg/m2  Review of Systems See HPI above.    Objective:   Physical Exam Gen: NAD  Bilateral hands: No gross deformity, swelling, bruising. Marked TTP 1st CMC joint R > L.  No 1st dorsal compartment or carpal tunnel tenderness.  No other hand, wrist TTP. Decreased ROM all planes of 1st CMCs.  FROM MCP and IP of thumb. Collateral ligaments intact. Negative tinels carpal tunnel. Negative finkelsteins. NVI distally.    Assessment & Plan:  1. 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  Discussed tylenol, nsaids, glucosamine, capsaicin.  Injection given into right 1st CMC joint.  Heat as needed.  F/u as needed - consider injeciton on left side if this worsens.  After informed written consent patient was seated in chair.  Ultrasound used to identify 1st [facility] joint away from radial artery.  Prepped with alcohol swab then right 1st CMC joint injected with 0.5:0.5 marcaine: depomedrol.  She tolerated the procedure well without immediate complications.

## 2012-10-15 NOTE — Assessment & Plan Note (Signed)
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  Discussed tylenol, nsaids, glucosamine, capsaicin.  Injection given into right 1st CMC joint.  Heat as needed.  F/u as needed - consider injeciton on left side if this worsens.  After informed written consent patient was seated in chair.  Ultrasound used to identify 1st [facility] joint away from radial artery.  Prepped with alcohol swab then right 1st CMC joint injected with 0.5:0.5 marcaine: depomedrol.  She tolerated the procedure well without immediate complications.

## 2012-10-19 ENCOUNTER — Encounter: Payer: Self-pay | Admitting: Internal Medicine

## 2012-10-19 ENCOUNTER — Ambulatory Visit (INDEPENDENT_AMBULATORY_CARE_PROVIDER_SITE_OTHER): Payer: BC Managed Care – PPO | Admitting: Internal Medicine

## 2012-10-19 VITALS — BP 124/72 | HR 80 | Ht 63.0 in | Wt 158.0 lb

## 2012-10-19 DIAGNOSIS — Z6825 Body mass index (BMI) 25.0-25.9, adult: Secondary | ICD-10-CM

## 2012-10-19 DIAGNOSIS — K589 Irritable bowel syndrome without diarrhea: Secondary | ICD-10-CM

## 2012-10-19 DIAGNOSIS — E663 Overweight: Secondary | ICD-10-CM

## 2012-10-19 NOTE — Progress Notes (Signed)
  Subjective:    Patient ID: Crystal [name], female    DOB: Jul 09, 1951, 62 y.o.   MRN: [id number]  HPI Doing very well at this time. Had some increased diarrhea last year aftyer husband died from melanoma. Then she was ok, then episodic problems. Metformin discontinued a few weeks ago and no problems. Asking if she can come off Lotronex. She has no problems like abdominal pain while on Lotronex.  Medications, allergies, past medical history, past surgical history, family history and social history are reviewed and updated in the EMR.  Review of Systems Thumb pain    Objective:   Physical Exam NAD Abdomen BS + soft and nontender    Assessment & Plan:   IBS (irritable bowel syndrome)-diarrhea predominant   Doing well.  Will see if she can go without Lotronex now that she is off metformin. See me in a year if still needing Lotronex, otherwise follow-up as needed.

## 2012-10-19 NOTE — Patient Instructions (Addendum)
It is ok per Dr. Leone Payor that your going to try and come off your Lotronex.  If your able to do this just come back and see [place] as needed.  If you continue the Lotronex come back and see [place] in a year for refills.  Let us know if your unable to stop the Lotronex.  Thank you for choosing me and [facility].  Iva Boop, M.D., [facility]

## 2012-10-21 ENCOUNTER — Ambulatory Visit: Payer: BC Managed Care – PPO | Admitting: Pulmonary Disease

## 2012-10-22 ENCOUNTER — Ambulatory Visit (INDEPENDENT_AMBULATORY_CARE_PROVIDER_SITE_OTHER): Payer: BC Managed Care – PPO | Admitting: Pulmonary Disease

## 2012-10-22 ENCOUNTER — Encounter: Payer: Self-pay | Admitting: Pulmonary Disease

## 2012-10-22 VITALS — BP 126/80 | HR 99 | Temp 96.7°F | Ht 63.0 in | Wt 161.2 lb

## 2012-10-22 DIAGNOSIS — G4733 Obstructive sleep apnea (adult) (pediatric): Secondary | ICD-10-CM

## 2012-10-22 DIAGNOSIS — J45909 Unspecified asthma, uncomplicated: Secondary | ICD-10-CM

## 2012-10-22 NOTE — Progress Notes (Signed)
  Subjective:    Patient ID: Crystal [name], female    DOB: [birth year], 62 y.o.   MRN: [id number]  HPI 62 yo female with known hx of HTN, DM and Hyperlipidmia for FU of asthma & OSA.  Initial pulmonary consult on June 18, 2010 for evaluation of uncontrolled ' asthma. '  June 18, 2010--DOE x 4 mnths. she was hospitalized at [facility] 02/2010, went walking for exercise came back felt very short of breath, had chest pain and says she passed out x 2, Says she had breathing test that showed she had asthma. Was referred to [facility] where she was started on [facility] but has had no improvement and use proair 3 x She works fulltime on computers, no change in house. has 1 indoor pet no unusual hobbies. No known hx of asthma in past or childhood. No family hx of Asthma. She does have sinus congestion, drainage intermittent and does have some intermittent GERD despite PPI daily.   spirometry >> Baseline shows restriction with FEV1 80% (but nml ratio), improved to 102 % post BD.  Stress test nml  Reviewed records from [facility] >>AHI 37 corrected by CPAP 11 cm, oronasal mask ([facility] resp) RAST neg- IgE 23   06/03/2012  Her husband of 17 y passed away due to metastatic melanoma  Spirometry showed nml lung function-fev1 100%   10/22/2012  Pt states she still has not received face mask bc it is $85. she has not worn CPAP x 1 year.  since the pollen started has had some allergy problems.  Denies wheezing  Review of Systems neg for any significant sore throat, dysphagia, itching, sneezing, nasal congestion or excess/ purulent secretions, fever, chills, sweats, unintended wt loss, pleuritic or exertional cp, hempoptysis, orthopnea pnd or change in chronic leg swelling. Also denies presyncope, palpitations, heartburn, abdominal pain, nausea, vomiting, diarrhea or change in bowel or urinary habits, dysuria,hematuria, rash, arthralgias, visual complaints, headache, numbness  weakness or ataxia.     Objective:   Physical Exam  Gen. Pleasant, well-nourished, in no distress ENT - no lesions, no post nasal drip Neck: No JVD, no thyromegaly, no carotid bruits Lungs: no use of accessory muscles, no dullness to percussion, clear without rales or rhonchi  Cardiovascular: Rhythm regular, heart sounds  normal, no murmurs or gallops, no peripheral edema Musculoskeletal: No deformities, no cyanosis or clubbing         Assessment & Plan:

## 2012-10-22 NOTE — Assessment & Plan Note (Signed)
We will get you started with new DME - [facility] phone no given  Weight loss encouraged, compliance with goal of at least 4-6 hrs every night is the expectation. Advised against medications with sedative side effects Cautioned against driving when sleepy - understanding that sleepiness will vary on a day to day basis

## 2012-10-22 NOTE — Patient Instructions (Addendum)
Decrease advair to 1 puff daily Stay on allergy medications We will get you started with new DME - [facility] phone no

## 2012-10-22 NOTE — Assessment & Plan Note (Signed)
Decrease advair to 1 puff daily -if stays OK step down further to 100/50 daily - again favor allergies over true asthma Stay on allergy medications Albuterol refilled

## 2012-11-01 ENCOUNTER — Telehealth: Payer: Self-pay | Admitting: *Deleted

## 2012-11-01 ENCOUNTER — Encounter: Payer: Self-pay | Admitting: *Deleted

## 2012-11-01 NOTE — Telephone Encounter (Signed)
Received call from pt that she noticed some bleeding over one of her stretch marks last week. Pt reports that she applied antibiotic ointment and it is healing well. Pt was concerned why this might have happened and if it was a stretch mark that bled? Advised pt that she would need to be seen for [place] to determine whether or not it is a stretch mark and pt states she has an appt on 11/12/12 and will wait until then to discuss with Provider. Advised pt if area of concern becomes inflammed or does not completely heal to come in sooner for evaluation and she voices understanding. Please advise if there are further instructions.

## 2012-11-10 ENCOUNTER — Telehealth: Payer: Self-pay | Admitting: Family

## 2012-11-10 MED ORDER — SIMVASTATIN 40 MG PO TABS: 40.0000 mg | ORAL_TABLET | Freq: Every day | ORAL | Status: DC

## 2012-11-10 NOTE — Telephone Encounter (Signed)
Refill- simvastatin 40mg tab. Take one tablet by mouth at bedtime. Qty 30 last fill 4.8.14 °

## 2012-11-10 NOTE — Telephone Encounter (Signed)
Rx sent in to pharmacy. 

## 2012-11-12 ENCOUNTER — Ambulatory Visit (INDEPENDENT_AMBULATORY_CARE_PROVIDER_SITE_OTHER): Payer: BC Managed Care – PPO | Admitting: Family

## 2012-11-12 ENCOUNTER — Encounter: Payer: Self-pay | Admitting: Family

## 2012-11-12 VITALS — BP 118/70 | HR 77 | Temp 98.0°F | Resp 16 | Ht 63.5 in | Wt 157.1 lb

## 2012-11-12 DIAGNOSIS — F341 Dysthymic disorder: Secondary | ICD-10-CM

## 2012-11-12 DIAGNOSIS — M79645 Pain in left finger(s): Secondary | ICD-10-CM

## 2012-11-12 DIAGNOSIS — E1149 Type 2 diabetes mellitus with other diabetic neurological complication: Secondary | ICD-10-CM

## 2012-11-12 DIAGNOSIS — E1142 Type 2 diabetes mellitus with diabetic polyneuropathy: Secondary | ICD-10-CM

## 2012-11-12 DIAGNOSIS — M79609 Pain in unspecified limb: Secondary | ICD-10-CM

## 2012-11-12 MED ORDER — CANAGLIFLOZIN 100 MG PO TABS: 1.0000 | ORAL_TABLET | Freq: Every day | ORAL | Status: DC

## 2012-11-12 NOTE — Assessment & Plan Note (Addendum)
Deteriorated.  Start invokana.  I have asked the pt to check BP daily and call me if low bp occurs after starting.

## 2012-11-12 NOTE — Assessment & Plan Note (Signed)
X ray left thumb at pt request.  Suspect due to OA.

## 2012-11-12 NOTE — Progress Notes (Signed)
Subjective:    Patient ID: Crystal [name], female    DOB: 1951-04-28, 62 y.o.   MRN: [id number]  HPI  [name] is a 62 yr old female who presents today for follow up.  1) Depression- Pt resumed sertraline 50 mg and is taking 1/2 tablet by mouth daily. She reports that she restarted 3-4 days ago. Feeling better.  Felt hopeless prior to starting.    2) Abrasion- reports "lumps" on lower abdomen. Intermittent.  3) Pain- reports left thumb pain x 2-3 weeks.She is being treated by Dr. Norton Blizzard. Had injection in the right thumb.  Feels like there is a tender nodule at the base of the left thumb which started hurting after she poked her thumb with a lancet.  Worried that the needle may have broken off.    4)  DM2- stopped metformin due to diarrhea.  Can't get sugars under 140. Continues [place].    Review of Systems See HPI  Past Medical History  Diagnosis Date  . Hypertension   . Hyperlipemia   . Dyspepsia   . OSA (obstructive sleep apnea)   . Plantar fasciitis   . Depression   . Asthma     02/08/10 FEV1 1.98 (80%), s/p saba 2.22 l/m (90%).nml  . Anxiety   . 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 Fatty liver 12/19/2010  . Internal hemorrhoids   . Diverticulosis   . Allergy     dust, cock roach,grass,weeds,molds  . GERD (gastroesophageal reflux disease)   . Acute peptic ulcer of stomach     As a teenager  . IBS (irritable bowel syndrome)     History   Social History  . Marital Status: Married    Spouse Name: Crystal [name]    Number of Children: 4  . 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 Smoking status: Never Smoker   . Smokeless tobacco: Never Used  . Alcohol Use: No  . Drug Use: No  . Sexually Active: Not on file   Other Topics Concern  . Not on file   Social History Narrative   No caffeine drinks daily     Past Surgical History  Procedure Laterality Date  . Knee arthroscopy  1998    left  knee  . Toe surgery  1995    right  . Nasal septum surgery    . Rhinoplasty    . Appendectomy      age 62  . Colonoscopy  06/03/2010, 08/25/2011    diverticulosis, internal and external hemorrhoids, random biopsies negative in 2013  . Gastrectomy      partial - ulcer as a teen    Family History  Problem Relation Age of Onset  . Diabetes Mother     type II  . Heart attack Mother 69  . Ulcers      bleeding gastric  . Colon cancer Sister   . Heart defect      child  . Other      perforated bowel, child    Allergies  Allergen Reactions  . Prochlorperazine Edisylate Other (See Comments)    coma for 3 days  . Tetanus Toxoid Other (See Comments)    convulsions    Current Outpatient Prescriptions on File Prior to Visit  Medication Sig Dispense Refill  . albuterol (PROAIR HFA) 108 (90 BASE) MCG/ACT inhaler Inhale 2 puffs into the lungs every 4 (  four) hours as needed. Shortness of breath and wheezing  18 g  1  . aspirin 81 MG tablet Take 81 mg by mouth daily.      . Blood Glucose Monitoring Suppl (ACCU-CHEK AVIVA PLUS) W/DEVICE KIT 1 kit by Does not apply route daily. DX 250.00  1 kit  0  . clotrimazole (LOTRIMIN) 1 % cream Apply to affected area 2 times daily x 2 weeks  15 g  0  . Fluticasone-Salmeterol (ADVAIR DISKUS) 250-50 MCG/DOSE AEPB Inhale 1 puff into the lungs 2 (two) times daily.  60 each  6  . glucose blood (ACCU-CHEK AVIVA PLUS) test strip Use to check blood sugar once daily as instructed.  DX 250.00  50 each  2  . Lancets (ACCU-CHEK MULTICLIX) lancets Use to check blood sugar once daily as instructed.  DX 250.00  100 each  2  . lisinopril-hydrochlorothiazide (PRINZIDE,ZESTORETIC) 20-12.5 MG per tablet Take 0.5 tablets by mouth daily.  15 tablet  2  . mometasone (NASONEX) 50 MCG/ACT nasal spray Place 2 sprays into the nose daily.  17 g  6  . Multiple Vitamins-Minerals (CENTRUM SILVER PO) Take 1 tablet by mouth daily.        . ondansetron (ZOFRAN) 4 MG tablet Take 1  tablet (4 mg total) by mouth every 8 (eight) hours as needed for nausea.  60 tablet  1  . Probiotic Product (ALIGN) 4 MG CAPS Take 1 capsule by mouth daily.        . ranitidine (ZANTAC) 150 MG capsule Take 1 capsule (150 mg total) by mouth 2 (two) times daily.  180 capsule  1  . simvastatin (ZOCOR) 40 MG tablet Take 1 tablet (40 mg total) by mouth at bedtime.  90 tablet  1  . sitaGLIPtin (JANUVIA) 100 MG tablet Take 1 tablet (100 mg total) by mouth daily.  30 tablet  2  . valACYclovir (VALTREX) 1000 MG tablet Take 1,000 mg by mouth 2 (two) times daily.       No current facility-administered medications on file prior to visit.    BP 118/70  Pulse 77  Temp(Src) 98 F (36.7 C) (Oral)  Resp 16  Ht 5' 3.5" (1.613 m)  Wt 157 lb 1.9 oz (71.269 kg)  BMI 27.39 kg/m2  SpO2 99%       Objective:   Physical Exam  Constitutional: She is oriented to person, place, and time. She appears well-developed and well-nourished. No distress.  Cardiovascular: Normal rate and regular rhythm.   No murmur heard. Pulmonary/Chest: Effort normal and breath sounds normal. No respiratory distress. She has no wheezes. She has no rales. She exhibits no tenderness.  Musculoskeletal: She exhibits no edema.  Neurological: She is alert and oriented to person, place, and time.  Skin: Skin is warm and dry.  Stretch marks noted on lower abdomen.  She appears to have a healed cyst on the lower abdomen.   Psychiatric: She has a normal mood and affect. Her behavior is normal. Judgment and thought content normal.          Assessment & Plan:

## 2012-11-12 NOTE — Patient Instructions (Addendum)
Please start invokana.  Follow up in 1 month.

## 2012-11-12 NOTE — Assessment & Plan Note (Signed)
Had worsened, pt restarted zoloft and now feels that she is back to baseline. Continue same.

## 2012-11-15 ENCOUNTER — Ambulatory Visit (HOSPITAL_BASED_OUTPATIENT_CLINIC_OR_DEPARTMENT_OTHER)
Admission: RE | Admit: 2012-11-15 | Discharge: 2012-11-15 | Disposition: A | Discharge status: Home / Self Care | Payer: BC Managed Care – PPO | Source: Ambulatory Visit | Attending: Family | Admitting: Family

## 2012-11-15 DIAGNOSIS — M79645 Pain in left finger(s): Secondary | ICD-10-CM

## 2012-11-15 DIAGNOSIS — M79609 Pain in unspecified limb: Secondary | ICD-10-CM | POA: Insufficient documentation

## 2012-11-16 ENCOUNTER — Encounter: Payer: Self-pay | Admitting: Family Medicine

## 2012-11-16 ENCOUNTER — Encounter: Payer: Self-pay | Admitting: Family

## 2012-11-16 ENCOUNTER — Ambulatory Visit (INDEPENDENT_AMBULATORY_CARE_PROVIDER_SITE_OTHER): Payer: BC Managed Care – PPO | Admitting: Family

## 2012-11-16 ENCOUNTER — Ambulatory Visit (INDEPENDENT_AMBULATORY_CARE_PROVIDER_SITE_OTHER): Payer: BC Managed Care – PPO | Admitting: Family Medicine

## 2012-11-16 VITALS — BP 109/68 | HR 82 | Ht 63.0 in | Wt 154.0 lb

## 2012-11-16 VITALS — BP 90/60 | HR 88 | Temp 97.7°F | Resp 16 | Ht 63.5 in

## 2012-11-16 DIAGNOSIS — J309 Allergic rhinitis, unspecified: Secondary | ICD-10-CM

## 2012-11-16 DIAGNOSIS — Z9109 Other allergy status, other than to drugs and biological substances: Secondary | ICD-10-CM | POA: Insufficient documentation

## 2012-11-16 DIAGNOSIS — I10 Essential (primary) hypertension: Secondary | ICD-10-CM

## 2012-11-16 DIAGNOSIS — M19049 Primary osteoarthritis, unspecified hand: Secondary | ICD-10-CM

## 2012-11-16 MED ORDER — LISINOPRIL 10 MG PO TABS: 10.0000 mg | ORAL_TABLET | Freq: Every day | ORAL | Status: DC

## 2012-11-16 NOTE — Assessment & Plan Note (Signed)
BP low since starting invokana.  D/c lisinopril hctz, start lisinopril 10mg , 1/2 tab daily repeat bp in 1 week.

## 2012-11-16 NOTE — Assessment & Plan Note (Signed)
Symptoms consistent with allergic rhinitis.  Rec trial of nasonex and clartin.  Consider abx if symptoms worsen or if not improved in 2-3 days.

## 2012-11-16 NOTE — Progress Notes (Signed)
Subjective:    Patient ID: Dillard Cannon, female    DOB: 1950/09/15, 62 y.o.   MRN: [id number]  HPI  Ms. Xu is a 62 yr old female who presents today with chief complaint of Nasal congestion. Reports pain "under my eyes and around my nose."  She reports temp at home <100.  She has tried otc dayquil/nyquil without improvement.  Cough is dry.   Review of Systems See HPI  Past Medical History  Diagnosis Date  . Hypertension   . Hyperlipemia   . Dyspepsia   . OSA (obstructive sleep apnea)   . Plantar fasciitis   . Depression   . Asthma     02/08/10 FEV1 1.98 (80%), s/p saba 2.22 l/m (90%).nml  . Anxiety   . 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 Fatty liver 12/19/2010  . Internal hemorrhoids   . Diverticulosis   . Allergy     dust, cock roach,grass,weeds,molds  . GERD (gastroesophageal reflux disease)   . Acute peptic ulcer of stomach     As a teenager  . IBS (irritable bowel syndrome)     History   Social History  . Marital Status: Married    Spouse Name: [name]    Number of Children: 4  . 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 Smoking status: Never Smoker   . Smokeless tobacco: Never Used  . Alcohol Use: No  . Drug Use: No  . Sexually Active: Not on file   Other Topics Concern  . Not on file   Social History Narrative   No caffeine drinks daily     Past Surgical History  Procedure Laterality Date  . Knee arthroscopy  1998    left knee  . Toe surgery  1995    right  . Nasal septum surgery    . Rhinoplasty    . Appendectomy      age 51  . Colonoscopy  06/03/2010, 08/25/2011    diverticulosis, internal and external hemorrhoids, random biopsies negative in 2013  . Gastrectomy      partial - ulcer as a teen    Family History  Problem Relation Age of Onset  . Diabetes Mother     type II  . Heart attack Mother 34  . Ulcers      bleeding gastric  . Colon cancer Sister   . Heart  defect      child  . Other      perforated bowel, child    Allergies  Allergen Reactions  . Prochlorperazine Edisylate Other (See Comments)    coma for 3 days  . Tetanus Toxoid Other (See Comments)    convulsions    Current Outpatient Prescriptions on File Prior to Visit  Medication Sig Dispense Refill  . albuterol (PROAIR HFA) 108 (90 BASE) MCG/ACT inhaler Inhale 2 puffs into the lungs every 4 (four) hours as needed. Shortness of breath and wheezing  18 g  1  . aspirin 81 MG tablet Take 81 mg by mouth daily.      . Blood Glucose Monitoring Suppl (ACCU-CHEK AVIVA PLUS) W/DEVICE KIT 1 kit by Does not apply route daily. DX 250.00  1 kit  0  . Canagliflozin (INVOKANA) 100 MG TABS Take 1 tablet (100 mg total) by mouth daily.  30 tablet  2  . clotrimazole (LOTRIMIN) 1 % cream Apply to affected area 2 times daily  x 2 weeks  15 g  0  . Fluticasone-Salmeterol (ADVAIR DISKUS) 250-50 MCG/DOSE AEPB Inhale 1 puff into the lungs 2 (two) times daily.  60 each  6  . glucose blood (ACCU-CHEK AVIVA PLUS) test strip Use to check blood sugar once daily as instructed.  DX 250.00  50 each  2  . Lancets (ACCU-CHEK MULTICLIX) lancets Use to check blood sugar once daily as instructed.  DX 250.00  100 each  2  . lisinopril-hydrochlorothiazide (PRINZIDE,ZESTORETIC) 20-12.5 MG per tablet Take 0.5 tablets by mouth daily.  15 tablet  2  . mometasone (NASONEX) 50 MCG/ACT nasal spray Place 2 sprays into the nose daily.  17 g  6  . Multiple Vitamins-Minerals (CENTRUM SILVER PO) Take 1 tablet by mouth daily.        . ondansetron (ZOFRAN) 4 MG tablet Take 1 tablet (4 mg total) by mouth every 8 (eight) hours as needed for nausea.  60 tablet  1  . Probiotic Product (ALIGN) 4 MG CAPS Take 1 capsule by mouth daily.        . ranitidine (ZANTAC) 150 MG capsule Take 1 capsule (150 mg total) by mouth 2 (two) times daily.  180 capsule  1  . sertraline (ZOLOFT) 50 MG tablet Take 25 mg by mouth daily.       . simvastatin (ZOCOR)  40 MG tablet Take 1 tablet (40 mg total) by mouth at bedtime.  90 tablet  1  . sitaGLIPtin (JANUVIA) 100 MG tablet Take 1 tablet (100 mg total) by mouth daily.  30 tablet  2  . valACYclovir (VALTREX) 1000 MG tablet Take 1,000 mg by mouth 2 (two) times daily.       No current facility-administered medications on file prior to visit.    BP 90/60  Pulse 88  Temp(Src) 97.7 F (36.5 C) (Oral)  Resp 16  Ht 5' 3.5" (1.613 m)  SpO2 99%       Objective:   Physical Exam  Constitutional: She is oriented to person, place, and time. She appears well-developed and well-nourished. No distress.  HENT:  Head: Normocephalic and atraumatic.  Right Ear: Tympanic membrane and ear canal normal.  Left Ear: Tympanic membrane and ear canal normal.  Mouth/Throat: No oropharyngeal exudate, posterior oropharyngeal edema or posterior oropharyngeal erythema.  No sinus tenderness to palpations.  Cardiovascular: Normal rate and regular rhythm.   No murmur heard. Pulmonary/Chest: Effort normal and breath sounds normal. No respiratory distress. She has no wheezes. She has no rales. She exhibits no tenderness.  Musculoskeletal: She exhibits no edema.  Lymphadenopathy:    She has no cervical adenopathy.  Neurological: She is alert and oriented to person, place, and time.  Skin: Skin is warm and dry.  Psychiatric: She has a normal mood and affect. Her behavior is normal. Judgment and thought content normal.          Assessment & Plan:

## 2012-11-16 NOTE — Patient Instructions (Addendum)
Call if symptoms worsen or if not improved in 2-3 days. Follow up in 1 week.

## 2012-11-17 ENCOUNTER — Encounter: Payer: Self-pay | Admitting: Family Medicine

## 2012-11-17 NOTE — Patient Instructions (Addendum)
Follow up as needed

## 2012-11-17 NOTE — Progress Notes (Signed)
Subjective:    Patient ID: Crystal [name], female    DOB: April 06, 1951, 62 y.o.   MRN: [id number]  PCP: Sandford Craze  Hand Pain    62 yo F here for f/u bilateral thumb pain.  4/3: Patient reports for about a year she has had pain at base of her thumbs. Has really intensified past few months, now very difficult to function. Pain radiates into thumb past MCP and into index finger. No numbness/tingling. Taking tylenol and using activeon. Works at a computer 8 hours a day, lots of typing. No prior treatment for this.  Not bracing.  5/6: Patient returns stating right thumb is much improved after injection last visit. Here for similar shot in left thumb as this one has worsened.  Past Medical History  Diagnosis Date  . Hypertension   . Hyperlipemia   . Dyspepsia   . OSA (obstructive sleep apnea)   . Plantar fasciitis   . Depression   . Asthma     02/08/10 FEV1 1.98 (80%), s/p saba 2.22 l/m (90%).nml  . Anxiety   . 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 Fatty liver 12/19/2010  . Internal hemorrhoids   . Diverticulosis   . Allergy     dust, cock roach,grass,weeds,molds  . GERD (gastroesophageal reflux disease)   . Acute peptic ulcer of stomach     As a teenager  . IBS (irritable bowel syndrome)     Current Outpatient Prescriptions on File Prior to Visit  Medication Sig Dispense Refill  . albuterol (PROAIR HFA) 108 (90 BASE) MCG/ACT inhaler Inhale 2 puffs into the lungs every 4 (four) hours as needed. Shortness of breath and wheezing  18 g  1  . aspirin 81 MG tablet Take 81 mg by mouth daily.      . Blood Glucose Monitoring Suppl (ACCU-CHEK AVIVA PLUS) W/DEVICE KIT 1 kit by Does not apply route daily. DX 250.00  1 kit  0  . Canagliflozin (INVOKANA) 100 MG TABS Take 1 tablet (100 mg total) by mouth daily.  30 tablet  2  . cetirizine (ZYRTEC) 10 MG tablet Take 10 mg by mouth daily.      . clotrimazole (LOTRIMIN) 1 % cream Apply to affected area 2 times daily x 2 weeks   15 g  0  . Fluticasone-Salmeterol (ADVAIR DISKUS) 250-50 MCG/DOSE AEPB Inhale 1 puff into the lungs 2 (two) times daily.  60 each  6  . glucose blood (ACCU-CHEK AVIVA PLUS) test strip Use to check blood sugar once daily as instructed.  DX 250.00  50 each  2  . Lancets (ACCU-CHEK MULTICLIX) lancets Use to check blood sugar once daily as instructed.  DX 250.00  100 each  2  . lisinopril (PRINIVIL,ZESTRIL) 10 MG tablet Take 1 tablet (10 mg total) by mouth daily.  30 tablet  0  . mometasone (NASONEX) 50 MCG/ACT nasal spray Place 2 sprays into the nose daily.  17 g  6  . Multiple Vitamins-Minerals (CENTRUM SILVER PO) Take 1 tablet by mouth daily.        . ondansetron (ZOFRAN) 4 MG tablet Take 1 tablet (4 mg total) by mouth every 8 (eight) hours as needed for nausea.  60 tablet  1  . Probiotic Product (ALIGN) 4 MG CAPS Take 1 capsule by mouth daily.        . ranitidine (ZANTAC) 150 MG capsule Take 1 capsule (150 mg total) by mouth 2 (two) times daily.  180 capsule  1  . sertraline (ZOLOFT) 50 MG tablet Take 25 mg by mouth daily.       . simvastatin (ZOCOR) 40 MG tablet Take 1 tablet (40 mg total) by mouth at bedtime.  90 tablet  1  . sitaGLIPtin (JANUVIA) 100 MG tablet Take 1 tablet (100 mg total) by mouth daily.  30 tablet  2  . valACYclovir (VALTREX) 1000 MG tablet Take 1,000 mg by mouth 2 (two) times daily.       No current facility-administered medications on file prior to visit.    Past Surgical History  Procedure Laterality Date  . Knee arthroscopy  1998    left knee  . Toe surgery  1995    right  . Nasal septum surgery    . Rhinoplasty    . Appendectomy      age 62  . Colonoscopy  06/03/2010, 08/25/2011    diverticulosis, internal and external hemorrhoids, random biopsies negative in 2013  . Gastrectomy      partial - ulcer as a teen    Allergies  Allergen Reactions  . Prochlorperazine Edisylate Other (See Comments)    coma for 3 days  . Tetanus Toxoid Other (See Comments)     convulsions    History   Social History  . Marital Status: Married    Spouse Name: [name]    Number of Children: 4  . 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 Smoking status: Never Smoker   . Smokeless tobacco: Never Used  . Alcohol Use: No  . Drug Use: No  . Sexually Active: Not on file   Other Topics Concern  . Not on file   Social History Narrative   No caffeine drinks daily     Family History  Problem Relation Age of Onset  . Diabetes Mother     type II  . Heart attack Mother 56  . Ulcers      bleeding gastric  . Colon cancer Sister   . Heart defect      child  . Other      perforated bowel, child    BP 109/68  Pulse 82  Ht 5\' 3"  (1.6 m)  Wt 154 lb (69.854 kg)  BMI 27.29 kg/m2  Review of Systems  See HPI above.    Objective:   Physical Exam  Gen: NAD  L hand: No gross deformity, swelling, bruising. Marked TTP 1st CMC joint.  No 1st dorsal compartment or carpal tunnel tenderness.  No other hand, wrist TTP. Decreased ROM all planes of 1st CMC.  FROM MCP and IP of thumb. Collateral ligaments intact. Negative finkelsteins. NVI distally.    Assessment & Plan:  1. 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  Went ahead with left 1st CMC injection today.  Ultrasound used to identify joint prior to injection.  Discussed tylenol, nsaids, glucosamine, capsaicin.  F/u prn.  After informed written consent patient was seated in chair.  Ultrasound used to identify 1st [facility] joint away from radial artery.  Prepped with alcohol swab then left 1st CMC joint injected with 0.5:0.5 marcaine: depomedrol.  She tolerated the procedure well without immediate complications.

## 2012-11-17 NOTE — Assessment & Plan Note (Signed)
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  Went ahead with left 1st CMC injection today.  Ultrasound used to identify joint prior to injection.  Discussed tylenol, nsaids, glucosamine, capsaicin.  F/u prn.  After informed written consent patient was seated in chair.  Ultrasound used to identify 1st [facility] joint away from radial artery.  Prepped with alcohol swab then left 1st CMC joint injected with 0.5:0.5 marcaine: depomedrol.  She tolerated the procedure well without immediate complications.

## 2012-11-23 ENCOUNTER — Telehealth: Payer: Self-pay | Admitting: Family

## 2012-11-23 ENCOUNTER — Ambulatory Visit: Payer: BC Managed Care – PPO | Admitting: Family

## 2012-11-23 NOTE — Telephone Encounter (Signed)
Refill- simvastatin 40mg  tab. Take one tablet by mouth at bedtime. Qty 30 last fill 4.8.14

## 2012-11-23 NOTE — Telephone Encounter (Signed)
[facility] pharmacy see if they had received rx simvastatin that was sent 11/10/12. Pharmacy confirmed that they had received rx and they would fill for patient.

## 2012-11-24 ENCOUNTER — Ambulatory Visit (INDEPENDENT_AMBULATORY_CARE_PROVIDER_SITE_OTHER): Payer: BC Managed Care – PPO | Admitting: Family

## 2012-11-24 ENCOUNTER — Encounter: Payer: Self-pay | Admitting: Family

## 2012-11-24 VITALS — BP 98/64 | HR 80 | Temp 97.9°F | Resp 16 | Ht 63.5 in | Wt 157.1 lb

## 2012-11-24 DIAGNOSIS — I10 Essential (primary) hypertension: Secondary | ICD-10-CM

## 2012-11-24 DIAGNOSIS — E119 Type 2 diabetes mellitus without complications: Secondary | ICD-10-CM

## 2012-11-24 DIAGNOSIS — L989 Disorder of the skin and subcutaneous tissue, unspecified: Secondary | ICD-10-CM

## 2012-11-24 NOTE — Patient Instructions (Addendum)
Follow up in 1 month. Stop lisinopril.

## 2012-11-24 NOTE — Progress Notes (Signed)
Subjective:    Patient ID: Crystal [name], female    DOB: [birth year], 62 y.o.   MRN: [id number]  HPI  [name] is a 62 yr old female who presents today for follow up of her blood pressure.  Last visit her blood pressure medication was decreased due to low blood pressure. This was attributed to recent initiation of Invokana. Ran out of strips so has not been checking her blood pressure.  Sugar "has not felt low."  She is requesting referral to dermatology to look at several moles.   Review of Systems See HPI  Past Medical History  Diagnosis Date  . Hypertension   . Hyperlipemia   . Dyspepsia   . OSA (obstructive sleep apnea)   . Plantar fasciitis   . Depression   . Asthma     02/08/10 FEV1 1.98 (80%), s/p saba 2.22 l/m (90%).nml  . Anxiety   . 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 Fatty liver 12/19/2010  . Internal hemorrhoids   . Diverticulosis   . Allergy     dust, cock roach,grass,weeds,molds  . GERD (gastroesophageal reflux disease)   . Acute peptic ulcer of stomach     As a teenager  . IBS (irritable bowel syndrome)     History   Social History  . Marital Status: Married    Spouse Name: Crystal [name]    Number of Children: 4  . 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 Smoking status: Never Smoker   . Smokeless tobacco: Never Used  . Alcohol Use: No  . Drug Use: No  . Sexually Active: Not on file   Other Topics Concern  . Not on file   Social History Narrative   No caffeine drinks daily     Past Surgical History  Procedure Laterality Date  . Knee arthroscopy  1998    left knee  . Toe surgery  1995    right  . Nasal septum surgery    . Rhinoplasty    . Appendectomy      age 31  . Colonoscopy  06/03/2010, 08/25/2011    diverticulosis, internal and external hemorrhoids, random biopsies negative in 2013  . Gastrectomy      partial - ulcer as a teen    Family History  Problem Relation Age  of Onset  . Diabetes Mother     type II  . Heart attack Mother 2  . Ulcers      bleeding gastric  . Colon cancer Sister   . Heart defect      child  . Other      perforated bowel, child    Allergies  Allergen Reactions  . Prochlorperazine Edisylate Other (See Comments)    coma for 3 days  . Tetanus Toxoid Other (See Comments)    convulsions    Current Outpatient Prescriptions on File Prior to Visit  Medication Sig Dispense Refill  . albuterol (PROAIR HFA) 108 (90 BASE) MCG/ACT inhaler Inhale 2 puffs into the lungs every 4 (four) hours as needed. Shortness of breath and wheezing  18 g  1  . aspirin 81 MG tablet Take 81 mg by mouth daily.      . Blood Glucose Monitoring Suppl (ACCU-CHEK AVIVA PLUS) W/DEVICE KIT 1 kit by Does not apply route daily. DX 250.00  1 kit  0  . Canagliflozin (INVOKANA) 100 MG TABS Take 1 tablet (  100 mg total) by mouth daily.  30 tablet  2  . cetirizine (ZYRTEC) 10 MG tablet Take 10 mg by mouth daily.      . clotrimazole (LOTRIMIN) 1 % cream Apply to affected area 2 times daily x 2 weeks  15 g  0  . Fluticasone-Salmeterol (ADVAIR DISKUS) 250-50 MCG/DOSE AEPB Inhale 1 puff into the lungs 2 (two) times daily.  60 each  6  . glucose blood (ACCU-CHEK AVIVA PLUS) test strip Use to check blood sugar once daily as instructed.  DX 250.00  50 each  2  . Lancets (ACCU-CHEK MULTICLIX) lancets Use to check blood sugar once daily as instructed.  DX 250.00  100 each  2  . lisinopril-hydrochlorothiazide (PRINZIDE,ZESTORETIC) 20-12.5 MG per tablet       . LOTRONEX 1 MG tablet       . mometasone (NASONEX) 50 MCG/ACT nasal spray Place 2 sprays into the nose daily.  17 g  6  . Multiple Vitamins-Minerals (CENTRUM SILVER PO) Take 1 tablet by mouth daily.        . ondansetron (ZOFRAN) 4 MG tablet Take 1 tablet (4 mg total) by mouth every 8 (eight) hours as needed for nausea.  60 tablet  1  . Probiotic Product (ALIGN) 4 MG CAPS Take 1 capsule by mouth daily.        . ranitidine  (ZANTAC) 150 MG capsule Take 1 capsule (150 mg total) by mouth 2 (two) times daily.  180 capsule  1  . sertraline (ZOLOFT) 50 MG tablet Take 25 mg by mouth daily.       . simvastatin (ZOCOR) 40 MG tablet Take 1 tablet (40 mg total) by mouth at bedtime.  90 tablet  1  . sitaGLIPtin (JANUVIA) 100 MG tablet Take 1 tablet (100 mg total) by mouth daily.  30 tablet  2  . valACYclovir (VALTREX) 1000 MG tablet Take 1,000 mg by mouth 2 (two) times daily.       No current facility-administered medications on file prior to visit.    BP 98/64  Pulse 80  Temp(Src) 97.9 F (36.6 C) (Oral)  Resp 16  Ht 5' 3.5" (1.613 m)  Wt 157 lb 1.3 oz (71.251 kg)  BMI 27.39 kg/m2  SpO2 98%       Objective:   Physical Exam  Constitutional: She appears well-developed and well-nourished. No distress.  Cardiovascular: Normal rate and regular rhythm.   No murmur heard. Pulmonary/Chest: Effort normal and breath sounds normal.  Musculoskeletal: She exhibits no edema.  Psychiatric: She has a normal mood and affect. Her behavior is normal. Judgment and thought content normal.          Assessment & Plan:

## 2012-11-24 NOTE — Assessment & Plan Note (Signed)
Uncontrolled. BP remains low despite cutting back her antihypertensives.  Likely secondary to invokana.  Will stop ACE.  Check urine microalbumin.  Follow up in 1 month. Referral has been made to derm.

## 2012-11-26 ENCOUNTER — Encounter: Payer: Self-pay | Admitting: Family

## 2012-12-14 ENCOUNTER — Other Ambulatory Visit: Payer: Self-pay | Admitting: Family

## 2012-12-14 NOTE — Telephone Encounter (Signed)
Left detailed message on pt's cell# and to call if any question.

## 2012-12-14 NOTE — Telephone Encounter (Signed)
Per our last visit she should be holding BP meds until her follow up.  Refill denied.

## 2012-12-14 NOTE — Telephone Encounter (Signed)
Please advise if pt should be on 10mg  once daily, or 12.5mg  1/2 tablet daily

## 2012-12-14 NOTE — Telephone Encounter (Signed)
Refill- lisinopril 10mg  tab. Take one tablet by mouth daily. Qty 30 last fill 5.6.14

## 2012-12-17 ENCOUNTER — Encounter: Payer: Self-pay | Admitting: Family

## 2012-12-17 ENCOUNTER — Ambulatory Visit (INDEPENDENT_AMBULATORY_CARE_PROVIDER_SITE_OTHER): Payer: BC Managed Care – PPO | Admitting: Family

## 2012-12-17 VITALS — BP 120/74 | HR 83 | Temp 97.8°F | Resp 16 | Ht 63.5 in | Wt 160.1 lb

## 2012-12-17 DIAGNOSIS — F341 Dysthymic disorder: Secondary | ICD-10-CM

## 2012-12-17 DIAGNOSIS — I10 Essential (primary) hypertension: Secondary | ICD-10-CM

## 2012-12-17 MED ORDER — SERTRALINE HCL 50 MG PO TABS: 50.0000 mg | ORAL_TABLET | Freq: Every day | ORAL | Status: DC

## 2012-12-17 NOTE — Patient Instructions (Addendum)
Please schedule a follow up appointment in 6 weeks

## 2012-12-17 NOTE — Assessment & Plan Note (Signed)
BP stable on of lisinopril hctz.  Continue off.  Urine microalbumin negative, but may need to consider adding low dose ace back in next visit if bp remains stable for Renal protection.

## 2012-12-17 NOTE — Assessment & Plan Note (Signed)
Deteriorated. Increase zoloft from 25mg  to 50 mg.  Follow up in 6 weeks.

## 2012-12-17 NOTE — Progress Notes (Signed)
  Subjective:    Patient ID: Crystal [name], female    DOB: [birth year], 62 y.o.   MRN: [id number]  HPI  Crystal [name] is a 62 yr old female who presents today for follow up of her HTN. Her lisinopril hctz was held last visit due to hypotension.  Depression-  The anniversary of her husband's death is approaching. She reports that she is "not doing well."  Thinks that her zoloft should be increased.  Reports stress eating.  Review of Systems See HPI     Objective:   Physical Exam  Constitutional: She is oriented to person, place, and time. She appears well-developed and well-nourished. No distress.  Cardiovascular: Normal rate and regular rhythm.   No murmur heard. Pulmonary/Chest: Effort normal and breath sounds normal.  Neurological: She is alert and oriented to person, place, and time.  Skin: Skin is warm and dry.  Psychiatric: She has a normal mood and affect. Her behavior is normal. Judgment and thought content normal.          Assessment & Plan:

## 2012-12-24 ENCOUNTER — Ambulatory Visit: Payer: BC Managed Care – PPO | Admitting: Family

## 2012-12-27 ENCOUNTER — Other Ambulatory Visit: Payer: Self-pay | Admitting: *Deleted

## 2012-12-27 MED ORDER — SITAGLIPTIN PHOSPHATE 100 MG PO TABS: 100.0000 mg | ORAL_TABLET | Freq: Every day | ORAL | Status: DC

## 2012-12-27 MED ORDER — FLUTICASONE-SALMETEROL 250-50 MCG/DOSE IN AEPB: 1.0000 | INHALATION_SPRAY | Freq: Two times a day (BID) | RESPIRATORY_TRACT | Status: DC

## 2012-12-30 ENCOUNTER — Telehealth: Payer: Self-pay | Admitting: *Deleted

## 2012-12-30 NOTE — Telephone Encounter (Signed)
Pt called twice first LM stating please change pharmacy to [facility] on [address]. Then call bck hour later & left another msg stating disregard first msg [facility] is too expensive . Will continue to go to [facility]...Raechel Chute

## 2013-01-05 ENCOUNTER — Telehealth: Payer: Self-pay | Admitting: *Deleted

## 2013-01-05 NOTE — Telephone Encounter (Signed)
Received fax from Karin Golden at [facility] requesting refills on pt's [place] and advair. Reviewed 12/30/12 phone note that states pt wants to continue using [facility] and attempted to reach pt for verification. Left detailed message for pt to call with clarification.

## 2013-01-05 NOTE — Telephone Encounter (Signed)
Pt called and states she will stay with [facility] and discard request from Karin Golden.

## 2013-01-18 ENCOUNTER — Telehealth: Payer: Self-pay | Admitting: Family

## 2013-01-18 MED ORDER — SERTRALINE HCL 50 MG PO TABS: 50.0000 mg | ORAL_TABLET | Freq: Every day | ORAL | Status: DC

## 2013-01-18 NOTE — Telephone Encounter (Signed)
Refill- sertraline 50mg  tab. Take one tablet by mouth daily. Qty 30

## 2013-01-19 ENCOUNTER — Telehealth: Payer: Self-pay | Admitting: *Deleted

## 2013-01-19 NOTE — Telephone Encounter (Signed)
Received fax from [facility] (fax [phone]) for pt's diabetic testing supplies. Attempted to confirm with pt that she is requesting to use this company and was unable to leave message. Will try again later.

## 2013-01-21 NOTE — Telephone Encounter (Signed)
Spoke with pt. She would like to use [facility] for her testing supplies. Advised pt form will be completed next week.

## 2013-01-24 NOTE — Telephone Encounter (Signed)
Form forwarded to Provider for signature. 

## 2013-01-25 ENCOUNTER — Telehealth: Payer: Self-pay | Admitting: Pulmonary Disease

## 2013-01-25 NOTE — Telephone Encounter (Signed)
Called pt x 2 to make next ov per recall.  Kept getting message stating the call could not be completed.  Mailed recall letter 01/25/13. Leanora Ivanoff

## 2013-01-26 NOTE — Telephone Encounter (Signed)
Completed form faxed to # below.

## 2013-01-28 ENCOUNTER — Encounter: Payer: Self-pay | Admitting: Family

## 2013-01-28 ENCOUNTER — Ambulatory Visit (INDEPENDENT_AMBULATORY_CARE_PROVIDER_SITE_OTHER): Payer: BC Managed Care – PPO | Admitting: Family

## 2013-01-28 VITALS — BP 130/84 | HR 86 | Temp 97.6°F | Resp 18 | Ht 63.5 in | Wt 160.0 lb

## 2013-01-28 DIAGNOSIS — E119 Type 2 diabetes mellitus without complications: Secondary | ICD-10-CM

## 2013-01-28 DIAGNOSIS — E1142 Type 2 diabetes mellitus with diabetic polyneuropathy: Secondary | ICD-10-CM

## 2013-01-28 DIAGNOSIS — I10 Essential (primary) hypertension: Secondary | ICD-10-CM

## 2013-01-28 DIAGNOSIS — F341 Dysthymic disorder: Secondary | ICD-10-CM

## 2013-01-28 DIAGNOSIS — E1149 Type 2 diabetes mellitus with other diabetic neurological complication: Secondary | ICD-10-CM

## 2013-01-28 DIAGNOSIS — E785 Hyperlipidemia, unspecified: Secondary | ICD-10-CM

## 2013-01-28 MED ORDER — SERTRALINE HCL 50 MG PO TABS: 50.0000 mg | ORAL_TABLET | Freq: Every day | ORAL | Status: DC

## 2013-01-28 MED ORDER — LISINOPRIL 2.5 MG PO TABS: 2.5000 mg | ORAL_TABLET | Freq: Every day | ORAL | Status: DC

## 2013-01-28 NOTE — Patient Instructions (Addendum)
Please complete your lab work prior to leaving. Follow up in 1 month for nurse visit for BMET and BP check.  Please schedule a follow up appointment in 3 months.

## 2013-01-28 NOTE — Progress Notes (Signed)
Subjective:    Patient ID: Crystal [name], female    DOB: [birth year], 62 y.o.   MRN: [id number]  HPI  Crystal [name] is a 62 yr old female who presents today for follow up.  Depression-Last visit her depression symptoms worsened. Sertraline was restarted. She reports that couldn't tolerate 50mg  due to drowsiness. Now on 25mg .  Reports that her mood is much improved.   HTN- she is currently maintained on lisinopril.  DM2- the pt continue invokana, januvia   Review of Systems    see HPI  Past Medical History  Diagnosis Date  . Hypertension   . Hyperlipemia   . Dyspepsia   . OSA (obstructive sleep apnea)   . Plantar fasciitis   . Depression   . Asthma     02/08/10 FEV1 1.98 (80%), s/p saba 2.22 l/m (90%).nml  . Anxiety   . 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 Fatty liver 12/19/2010  . Internal hemorrhoids   . Diverticulosis   . Allergy     dust, cock roach,grass,weeds,molds  . GERD (gastroesophageal reflux disease)   . Acute peptic ulcer of stomach     As a teenager  . IBS (irritable bowel syndrome)     History   Social History  . Marital Status: Married    Spouse Name: [name]    Number of Children: 4  . 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 Smoking status: Never Smoker   . Smokeless tobacco: Never Used  . Alcohol Use: No  . Drug Use: No  . Sexually Active: Not on file   Other Topics Concern  . Not on file   Social History Narrative   No caffeine drinks daily     Past Surgical History  Procedure Laterality Date  . Knee arthroscopy  1998    left knee  . Toe surgery  1995    right  . Nasal septum surgery    . Rhinoplasty    . Appendectomy      age 42  . Colonoscopy  06/03/2010, 08/25/2011    diverticulosis, internal and external hemorrhoids, random biopsies negative in 2013  . Gastrectomy      partial - ulcer as a teen    Family History  Problem Relation Age of Onset  . Diabetes  Mother     type II  . Heart attack Mother 2  . Ulcers      bleeding gastric  . Colon cancer Sister   . Heart defect      child  . Other      perforated bowel, child    Allergies  Allergen Reactions  . Prochlorperazine Edisylate Other (See Comments)    coma for 3 days  . Tetanus Toxoid Other (See Comments)    convulsions    Current Outpatient Prescriptions on File Prior to Visit  Medication Sig Dispense Refill  . albuterol (PROAIR HFA) 108 (90 BASE) MCG/ACT inhaler Inhale 2 puffs into the lungs every 4 (four) hours as needed. Shortness of breath and wheezing  18 g  1  . aspirin 81 MG tablet Take 81 mg by mouth daily.      . Blood Glucose Monitoring Suppl (ACCU-CHEK AVIVA PLUS) W/DEVICE KIT 1 kit by Does not apply route daily. DX 250.00  1 kit  0  . Canagliflozin (INVOKANA) 100 MG TABS Take 1 tablet (100 mg total) by mouth daily.  30 tablet  2  . cetirizine (ZYRTEC) 10 MG tablet Take 10 mg by mouth daily.      . clotrimazole (LOTRIMIN) 1 % cream Apply to affected area 2 times daily x 2 weeks  15 g  0  . Fluticasone-Salmeterol (ADVAIR DISKUS) 250-50 MCG/DOSE AEPB Inhale 1 puff into the lungs 2 (two) times daily.  60 each  6  . glucose blood (ACCU-CHEK AVIVA PLUS) test strip Use to check blood sugar once daily as instructed.  DX 250.00  50 each  2  . Lancets (ACCU-CHEK MULTICLIX) lancets Use to check blood sugar once daily as instructed.  DX 250.00  100 each  2  . LOTRONEX 1 MG tablet       . mometasone (NASONEX) 50 MCG/ACT nasal spray Place 2 sprays into the nose daily.  17 g  6  . Multiple Vitamins-Minerals (CENTRUM SILVER PO) Take 1 tablet by mouth daily.        . ondansetron (ZOFRAN) 4 MG tablet Take 1 tablet (4 mg total) by mouth every 8 (eight) hours as needed for nausea.  60 tablet  1  . Probiotic Product (ALIGN) 4 MG CAPS Take 1 capsule by mouth daily.        . ranitidine (ZANTAC) 150 MG capsule Take 1 capsule (150 mg total) by mouth 2 (two) times daily.  180 capsule  1  .  simvastatin (ZOCOR) 40 MG tablet Take 1 tablet (40 mg total) by mouth at bedtime.  90 tablet  1  . sitaGLIPtin (JANUVIA) 100 MG tablet Take 1 tablet (100 mg total) by mouth daily.  30 tablet  2  . valACYclovir (VALTREX) 1000 MG tablet Take 1,000 mg by mouth 2 (two) times daily.       No current facility-administered medications on file prior to visit.    BP 130/84  Pulse 86  Temp(Src) 97.6 F (36.4 C) (Oral)  Resp 18  Ht 5' 3.5" (1.613 m)  Wt 160 lb (72.576 kg)  BMI 27.89 kg/m2  SpO2 99%    Objective:   Physical Exam  Constitutional: She is oriented to person, place, and time. She appears well-developed and well-nourished. No distress.  HENT:  Head: Normocephalic and atraumatic.  Cardiovascular: Normal rate and regular rhythm.   No murmur heard. Pulmonary/Chest: Effort normal and breath sounds normal. No respiratory distress. She has no wheezes. She has no rales. She exhibits no tenderness.  Musculoskeletal: She exhibits no edema.  Neurological: She is alert and oriented to person, place, and time.  Psychiatric: She has a normal mood and affect. Her behavior is normal. Judgment and thought content normal.          Assessment & Plan:

## 2013-01-29 LAB — HEMOGLOBIN A1C
Hgb A1c MFr Bld: 6.3 % — ABNORMAL HIGH (ref <5.7)
Mean Plasma Glucose: 134 mg/dL — ABNORMAL HIGH (ref <117)

## 2013-01-29 LAB — BASIC METABOLIC PANEL WITH GFR
BUN: 15 mg/dL (ref 6–23)
Calcium: 9.6 mg/dL (ref 8.4–10.5)
Chloride: 106 mEq/L (ref 96–112)
Creat: 0.88 mg/dL (ref 0.50–1.10)
GFR, Est African American: 81 mL/min
GFR, Est Non African American: 70 mL/min

## 2013-01-30 ENCOUNTER — Encounter: Payer: Self-pay | Admitting: Family

## 2013-01-30 NOTE — Assessment & Plan Note (Signed)
Improved on sertraline, continue same.

## 2013-01-30 NOTE — Assessment & Plan Note (Signed)
A1C is improved at 6.3.  Continue current meds. Monitor.

## 2013-01-30 NOTE — Assessment & Plan Note (Signed)
BP Readings from Last 3 Encounters:  01/28/13 130/84  12/17/12 120/74  11/24/12 98/64   Stable on lisinopril. Continue same.

## 2013-03-01 ENCOUNTER — Encounter: Payer: Self-pay | Admitting: Family

## 2013-03-04 ENCOUNTER — Ambulatory Visit (INDEPENDENT_AMBULATORY_CARE_PROVIDER_SITE_OTHER): Payer: BC Managed Care – PPO | Admitting: Family

## 2013-03-04 ENCOUNTER — Encounter: Payer: Self-pay | Admitting: Family Medicine

## 2013-03-04 ENCOUNTER — Telehealth: Payer: Self-pay | Admitting: *Deleted

## 2013-03-04 ENCOUNTER — Ambulatory Visit (INDEPENDENT_AMBULATORY_CARE_PROVIDER_SITE_OTHER): Payer: BC Managed Care – PPO | Admitting: Family Medicine

## 2013-03-04 VITALS — BP 136/80 | Wt 157.0 lb

## 2013-03-04 VITALS — BP 119/83 | HR 88 | Ht 63.0 in | Wt 156.0 lb

## 2013-03-04 DIAGNOSIS — M19049 Primary osteoarthritis, unspecified hand: Secondary | ICD-10-CM

## 2013-03-04 DIAGNOSIS — I10 Essential (primary) hypertension: Secondary | ICD-10-CM

## 2013-03-04 MED ORDER — ALBUTEROL SULFATE HFA 108 (90 BASE) MCG/ACT IN AERS: 2.0000 | INHALATION_SPRAY | RESPIRATORY_TRACT | Status: DC | PRN

## 2013-03-04 NOTE — Telephone Encounter (Signed)
Pt presented to the lab for basic metabolic panel per 01/28/13 office note. Order entered.

## 2013-03-04 NOTE — Patient Instructions (Addendum)
Use the thumb spica braces as often as possible during the day. Try the topical compounding medication. Aleve 1-2 tabs twice a day with food for pain and inflammation. If still having difficulty would then consider injection on one or both sides. Follow up with me in about 3 months (ok to come sooner if needed).

## 2013-03-04 NOTE — Telephone Encounter (Signed)
Received call from [facility] pharmacy that pt is transferring rxs from [facility] to them and ProAir is out of refills. Refills sent.

## 2013-03-05 ENCOUNTER — Encounter: Payer: Self-pay | Admitting: Family

## 2013-03-05 LAB — BASIC METABOLIC PANEL
Calcium: 9.6 mg/dL (ref 8.4–10.5)
Creat: 1.06 mg/dL (ref 0.50–1.10)
Sodium: 140 mEq/L (ref 135–145)

## 2013-03-05 NOTE — Progress Notes (Signed)
  Subjective:    Patient ID: Crystal [name], female    DOB: Jun 13, 1951, 62 y.o.   MRN: [id number]  HPI  Pt presented for nurse visit for bp check.    Review of Systems     Objective:   Physical Exam        Assessment & Plan:

## 2013-03-05 NOTE — Assessment & Plan Note (Signed)
BP Readings from Last 3 Encounters:  03/04/13 136/80  03/04/13 119/83  01/28/13 130/84   BP stable on current meds.  Continue same.  Obtain bmet.

## 2013-03-07 ENCOUNTER — Encounter: Payer: Self-pay | Admitting: Family Medicine

## 2013-03-07 NOTE — Assessment & Plan Note (Signed)
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  Will do braces for both sides now.  Tylenol, nsaids, glucosamine, capsaicin as discussed previously.  F/u prn.

## 2013-03-07 NOTE — Progress Notes (Signed)
Patient ID: Crystal [name], female   DOB: [birth year], 62 y.o.   MRN: [id number]  Subjective:    Patient ID: Crystal [name], female    DOB: April 02, 1951, 62 y.o.   MRN: [id number]  PCP: Crystal [name]  Hand Pain    62 yo F here for f/u bilateral thumb pain.  4/3: Patient reports for about a year she has had pain at base of her thumbs. Has really intensified past few months, now very difficult to function. Pain radiates into thumb past MCP and into index finger. No numbness/tingling. Taking tylenol and using activeon. Works at a computer 8 hours a day, lots of typing. No prior treatment for this.  Not bracing.  5/6: Patient returns stating right thumb is much improved after injection last visit. Here for similar shot in left thumb as this one has worsened.  8/22: Patient returns as left and right thumb bases bothering her again. Has brace only for right but not currently using. Injections in both sides have lasted until past few weeks. Not icing or taking any medicines now.  Past Medical History  Diagnosis Date  . Hypertension   . Hyperlipemia   . Dyspepsia   . OSA (obstructive sleep apnea)   . Plantar fasciitis   . Depression   . Asthma     02/08/10 FEV1 1.98 (80%), s/p saba 2.22 l/m (90%).nml  . Anxiety   . 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 Fatty liver 12/19/2010  . Internal hemorrhoids   . Diverticulosis   . Allergy     dust, cock roach,grass,weeds,molds  . GERD (gastroesophageal reflux disease)   . Acute peptic ulcer of stomach     As a teenager  . IBS (irritable bowel syndrome)     Current Outpatient Prescriptions on File Prior to Visit  Medication Sig Dispense Refill  . aspirin 81 MG tablet Take 81 mg by mouth daily.      . Blood Glucose Monitoring Suppl (ACCU-CHEK AVIVA PLUS) W/DEVICE KIT 1 kit by Does not apply route daily. DX 250.00  1 kit  0  . Canagliflozin (INVOKANA) 100 MG TABS Take 1 tablet (100 mg total) by mouth daily.  30 tablet  2  .  cetirizine (ZYRTEC) 10 MG tablet Take 10 mg by mouth daily.      . clotrimazole (LOTRIMIN) 1 % cream Apply to affected area 2 times daily x 2 weeks  15 g  0  . Fluticasone-Salmeterol (ADVAIR DISKUS) 250-50 MCG/DOSE AEPB Inhale 1 puff into the lungs 2 (two) times daily.  60 each  6  . glucose blood (ACCU-CHEK AVIVA PLUS) test strip Use to check blood sugar once daily as instructed.  DX 250.00  50 each  2  . Lancets (ACCU-CHEK MULTICLIX) lancets Use to check blood sugar once daily as instructed.  DX 250.00  100 each  2  . lisinopril (PRINIVIL,ZESTRIL) 2.5 MG tablet Take 1 tablet (2.5 mg total) by mouth daily.  30 tablet  2  . LOTRONEX 1 MG tablet       . mometasone (NASONEX) 50 MCG/ACT nasal spray Place 2 sprays into the nose daily.  17 g  6  . Multiple Vitamins-Minerals (CENTRUM SILVER PO) Take 1 tablet by mouth daily.        . ondansetron (ZOFRAN) 4 MG tablet Take 1 tablet (4 mg total) by mouth every 8 (eight) hours as needed for nausea.  60 tablet  1  . Probiotic Product (ALIGN) 4 MG  CAPS Take 1 capsule by mouth daily.        . ranitidine (ZANTAC) 150 MG capsule Take 1 capsule (150 mg total) by mouth 2 (two) times daily.  180 capsule  1  . sertraline (ZOLOFT) 50 MG tablet Take 1 tablet (50 mg total) by mouth daily.  30 tablet  1  . simvastatin (ZOCOR) 40 MG tablet Take 1 tablet (40 mg total) by mouth at bedtime.  90 tablet  1  . sitaGLIPtin (JANUVIA) 100 MG tablet Take 1 tablet (100 mg total) by mouth daily.  30 tablet  2  . valACYclovir (VALTREX) 1000 MG tablet Take 1,000 mg by mouth 2 (two) times daily.       No current facility-administered medications on file prior to visit.    Past Surgical History  Procedure Laterality Date  . Knee arthroscopy  1998    left knee  . Toe surgery  1995    right  . Nasal septum surgery    . Rhinoplasty    . Appendectomy      age 66  . Colonoscopy  06/03/2010, 08/25/2011    diverticulosis, internal and external hemorrhoids, random biopsies negative in  2013  . Gastrectomy      partial - ulcer as a teen    Allergies  Allergen Reactions  . Prochlorperazine Edisylate Other (See Comments)    coma for 3 days  . Tetanus Toxoid Other (See Comments)    convulsions    History   Social History  . Marital Status: Married    Spouse Name: [name]    Number of Children: 4  . 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 Smoking status: Never Smoker   . Smokeless tobacco: Never Used  . Alcohol Use: No  . Drug Use: No  . Sexual Activity: Not on file   Other Topics Concern  . Not on file   Social History Narrative   No caffeine drinks daily     Family History  Problem Relation Age of Onset  . Diabetes Mother     type II  . Heart attack Mother 73  . Ulcers      bleeding gastric  . Colon cancer Sister   . Heart defect      child  . Other      perforated bowel, child    BP 119/83  Pulse 88  Ht 5\' 3"  (1.6 m)  Wt 156 lb (70.761 kg)  BMI 27.64 kg/m2  Review of Systems See HPI above.    Objective:   Physical Exam Gen: NAD  L hand: No gross deformity, swelling, bruising. Mod TTP 1st CMC joint.  No 1st dorsal compartment or carpal tunnel tenderness.  No other hand, wrist TTP. Decreased ROM all planes of 1st CMC.  FROM MCP and IP of thumb. Collateral ligaments intact. Negative finkelsteins. NVI distally.  R hand: No gross deformity, swelling, bruising. Mod TTP 1st CMC joint.  No 1st dorsal compartment or carpal tunnel tenderness.  No other hand, wrist TTP. Decreased ROM all planes of 1st CMC.  FROM MCP and IP of thumb. Collateral ligaments intact. Negative finkelsteins. NVI distally.    Assessment & Plan:  1. 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  Will do braces for both sides now.  Tylenol, nsaids, glucosamine, capsaicin as discussed previously.  F/u prn.

## 2013-03-08 ENCOUNTER — Encounter: Payer: Self-pay | Admitting: Family Medicine

## 2013-03-15 ENCOUNTER — Telehealth: Payer: Self-pay | Admitting: Pulmonary Disease

## 2013-03-15 DIAGNOSIS — G4733 Obstructive sleep apnea (adult) (pediatric): Secondary | ICD-10-CM

## 2013-03-15 NOTE — Telephone Encounter (Signed)
I spoke with pt and she stated she needs a new CPAP mask sent to [facility]. I advised pt RX will be sent. Nothing further needed

## 2013-03-17 ENCOUNTER — Telehealth: Payer: Self-pay | Admitting: *Deleted

## 2013-03-17 MED ORDER — GLUCOSE BLOOD VI STRP: ORAL_STRIP | Status: DC

## 2013-03-17 NOTE — Telephone Encounter (Signed)
Received call from [place] at [facility] pharmacy that pt is requesting accuchek aviva test strips. Verbal given for #100 x 2 refills.

## 2013-03-29 ENCOUNTER — Encounter: Payer: Self-pay | Admitting: Pulmonary Disease

## 2013-03-29 ENCOUNTER — Telehealth: Payer: Self-pay | Admitting: *Deleted

## 2013-03-29 ENCOUNTER — Ambulatory Visit (INDEPENDENT_AMBULATORY_CARE_PROVIDER_SITE_OTHER): Payer: BC Managed Care – PPO | Admitting: Pulmonary Disease

## 2013-03-29 VITALS — BP 124/70 | HR 82 | Ht 63.0 in | Wt 156.0 lb

## 2013-03-29 DIAGNOSIS — G4733 Obstructive sleep apnea (adult) (pediatric): Secondary | ICD-10-CM

## 2013-03-29 DIAGNOSIS — J45909 Unspecified asthma, uncomplicated: Secondary | ICD-10-CM

## 2013-03-29 DIAGNOSIS — Z23 Encounter for immunization: Secondary | ICD-10-CM

## 2013-03-29 NOTE — Assessment & Plan Note (Signed)
OK to stop advair - favor allergies over asthma Use rescue inhaler as needed Flu shot

## 2013-03-29 NOTE — Assessment & Plan Note (Signed)
Stay on CPAP 11 cm - nasal mask seems to be working well

## 2013-03-29 NOTE — Telephone Encounter (Signed)
Pt came in to the office and wanted to verify that she is still supposed to be taking lisinopril. Advised pt per July and August office notes she is to continue medication. Pt voices understanding.

## 2013-03-29 NOTE — Progress Notes (Signed)
  Subjective:    Patient ID: Crystal [name], female    DOB: Oct 17, 1950, 62 y.o.   MRN: [id number]  HPI  62 yo female with known hx of HTN, DM and Hyperlipidmia for FU of asthma & OSA.  She was hospitalized at [facility] 02/2010 for asthma exacerbation. Was referred to [facility] where she was started on [facility] but  had no improvement and use proair 3 x She does have sinus congestion, drainage intermittent and does have some intermittent GERD despite PPI daily.  spirometry >> Baseline shows restriction with FEV1 80% (but nml ratio), improved to 102 % post BD.  Stress test nml  Reviewed records from [facility] >>AHI 37 corrected by CPAP 11 cm, oronasal mask ([facility])  RAST neg- IgE 23   06/03/2012  Her husband of 33 y passed away due to metastatic melanoma  Spirometry showed nml lung function-fev1 100%    03/29/2013 47m FU  Pt states she has good and bad days with her breathing. She denies any cough as of now. Occasional wheezing. C/o nasal congestion. No PND.  Takes advair 2-3/week Pt reports she wears her CPAP everynight x 10 hrs a night. She is still getting used to the machine. She is feeling rested more than before but she does not wake up feeling great yet.   Review of Systems neg for any significant sore throat, dysphagia, itching, sneezing, nasal congestion or excess/ purulent secretions, fever, chills, sweats, unintended wt loss, pleuritic or exertional cp, hempoptysis, orthopnea pnd or change in chronic leg swelling. Also denies presyncope, palpitations, heartburn, abdominal pain, nausea, vomiting, diarrhea or change in bowel or urinary habits, dysuria,hematuria, rash, arthralgias, visual complaints, headache, numbness weakness or ataxia.     Objective:   Physical Exam  Gen. Pleasant, well-nourished, in no distress ENT - no lesions, no post nasal drip Neck: No JVD, no thyromegaly, no carotid bruits Lungs: no use of accessory muscles, no  dullness to percussion, clear without rales or rhonchi  Cardiovascular: Rhythm regular, heart sounds  normal, no murmurs or gallops, no peripheral edema Musculoskeletal: No deformities, no cyanosis or clubbing        Assessment & Plan:

## 2013-03-29 NOTE — Patient Instructions (Addendum)
OK to stop advair Use rescue inhaler as needed Stay on CPAP - nasal mask seems to be working well Flu shot

## 2013-03-29 NOTE — Addendum Note (Signed)
Addended by: Tommie Sams on: 03/29/2013 03:46 PM   Modules accepted: Orders

## 2013-04-12 ENCOUNTER — Telehealth: Payer: Self-pay | Admitting: *Deleted

## 2013-04-12 LAB — HM MAMMOGRAPHY: HM Mammogram: NEGATIVE

## 2013-04-12 MED ORDER — CANAGLIFLOZIN 100 MG PO TABS: 1.0000 | ORAL_TABLET | Freq: Every day | ORAL | Status: DC

## 2013-04-12 NOTE — Telephone Encounter (Signed)
Received message from [place] at [facility] pharmacy that pt is transferring [facility] rx and they need new Rx as pt has not filled medication there before. Rx sent.

## 2013-04-13 ENCOUNTER — Encounter: Payer: Self-pay | Admitting: Family

## 2013-04-25 ENCOUNTER — Encounter: Payer: Self-pay | Admitting: Family

## 2013-04-25 ENCOUNTER — Ambulatory Visit (INDEPENDENT_AMBULATORY_CARE_PROVIDER_SITE_OTHER): Payer: BC Managed Care – PPO | Admitting: Family

## 2013-04-25 VITALS — BP 140/88 | HR 85 | Temp 97.5°F | Resp 16 | Wt 162.1 lb

## 2013-04-25 DIAGNOSIS — J019 Acute sinusitis, unspecified: Secondary | ICD-10-CM

## 2013-04-25 DIAGNOSIS — Z23 Encounter for immunization: Secondary | ICD-10-CM

## 2013-04-25 DIAGNOSIS — R1319 Other dysphagia: Secondary | ICD-10-CM

## 2013-04-25 DIAGNOSIS — R1310 Dysphagia, unspecified: Secondary | ICD-10-CM

## 2013-04-25 MED ORDER — ALBUTEROL SULFATE HFA 108 (90 BASE) MCG/ACT IN AERS: 2.0000 | INHALATION_SPRAY | RESPIRATORY_TRACT | Status: DC | PRN

## 2013-04-25 MED ORDER — AMOXICILLIN 500 MG PO CAPS: 500.0000 mg | ORAL_CAPSULE | Freq: Three times a day (TID) | ORAL | Status: DC

## 2013-04-25 MED ORDER — FLUTICASONE PROPIONATE 50 MCG/ACT NA SUSP: 2.0000 | Freq: Every day | NASAL | Status: DC

## 2013-04-25 NOTE — Patient Instructions (Signed)
You will be contacted about your referral for swallow study. Start amoxicillin and flonase Restart zyrtec. Call if you develop fever, if sinus symptoms worsen or if not improved in 2-3 days. Follow up on 10/27 as scheduled.

## 2013-04-25 NOTE — Progress Notes (Signed)
Subjective:    Patient ID: Crystal [name], female    DOB: 1950-11-13, 62 y.o.   MRN: [id number]  HPI  Ms. Wassenaar is a 47 who presents today with chief complaint of sinus pain and nasal congestion. Reports that she has been using otc preps x 2 weeks.  She denies associated fever.  Reports frontal and maxillary sinus tenderness to palpation.  Reports hx of deviated septum repair at age 17.  Severe sneezing attacks.  She reports "a little"  ear pain off and on.  Nasal drainage is green in color.   Reports that she has had "choking". She reports that when this happens she "can hardly breath." She reports that this is occuring almost every day.     Review of Systems See HPI  Past Medical History  Diagnosis Date  . Hypertension   . Hyperlipemia   . Dyspepsia   . OSA (obstructive sleep apnea)   . Plantar fasciitis   . Depression   . Asthma     02/08/10 FEV1 1.98 (80%), s/p saba 2.22 l/m (90%).nml  . Anxiety   . 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 Fatty liver 12/19/2010  . Internal hemorrhoids   . Diverticulosis   . Allergy     dust, cock roach,grass,weeds,molds  . GERD (gastroesophageal reflux disease)   . Acute peptic ulcer of stomach     As a teenager  . IBS (irritable bowel syndrome)     History   Social History  . Marital Status: Married    Spouse Name: [name]    Number of Children: 4  . 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 Smoking status: Never Smoker   . Smokeless tobacco: Never Used  . Alcohol Use: No  . Drug Use: No  . Sexual Activity: Not on file   Other Topics Concern  . Not on file   Social History Narrative   No caffeine drinks daily     Past Surgical History  Procedure Laterality Date  . Knee arthroscopy  1998    left knee  . Toe surgery  1995    right  . Nasal septum surgery    . Rhinoplasty    . Appendectomy      age 49  . Colonoscopy  06/03/2010, 08/25/2011   diverticulosis, internal and external hemorrhoids, random biopsies negative in 2013  . Gastrectomy      partial - ulcer as a teen    Family History  Problem Relation Age of Onset  . Diabetes Mother     type II  . Heart attack Mother 11  . Ulcers      bleeding gastric  . Colon cancer Sister   . Heart defect      child  . Other      perforated bowel, child    Allergies  Allergen Reactions  . Prochlorperazine Edisylate Other (See Comments)    coma for 3 days  . Tetanus Toxoid Other (See Comments)    convulsions    Current Outpatient Prescriptions on File Prior to Visit  Medication Sig Dispense Refill  . albuterol (PROAIR HFA) 108 (90 BASE) MCG/ACT inhaler Inhale 2 puffs into the lungs every 4 (four) hours as needed. Shortness of breath and wheezing  18 g  2  . aspirin 81 MG tablet Take 81 mg by mouth daily.      . Blood Glucose Monitoring  Suppl (ACCU-CHEK AVIVA PLUS) W/DEVICE KIT 1 kit by Does not apply route daily. DX 250.00  1 kit  0  . Canagliflozin (INVOKANA) 100 MG TABS Take 1 tablet (100 mg total) by mouth daily.  30 tablet  3  . cetirizine (ZYRTEC) 10 MG tablet Take 10 mg by mouth daily.      . clotrimazole (LOTRIMIN) 1 % cream Apply to affected area 2 times daily x 2 weeks  15 g  0  . glucose blood (ACCU-CHEK AVIVA PLUS) test strip Use to check blood sugar one to two times daily as instructed.  DX 250.00  100 each  2  . Lancets (ACCU-CHEK MULTICLIX) lancets Use to check blood sugar once daily as instructed.  DX 250.00  100 each  2  . lisinopril (PRINIVIL,ZESTRIL) 2.5 MG tablet Take 1 tablet (2.5 mg total) by mouth daily.  30 tablet  2  . LOTRONEX 1 MG tablet Take 1 mg by mouth daily.       . mometasone (NASONEX) 50 MCG/ACT nasal spray Place 2 sprays into the nose daily.  17 g  6  . Multiple Vitamins-Minerals (CENTRUM SILVER PO) Take 1 tablet by mouth daily.        . ondansetron (ZOFRAN) 4 MG tablet Take 1 tablet (4 mg total) by mouth every 8 (eight) hours as needed for  nausea.  60 tablet  1  . Probiotic Product (ALIGN) 4 MG CAPS Take 1 capsule by mouth daily.        . ranitidine (ZANTAC) 150 MG capsule Take 1 capsule (150 mg total) by mouth 2 (two) times daily.  180 capsule  1  . sertraline (ZOLOFT) 50 MG tablet Take 1 tablet (50 mg total) by mouth daily.  30 tablet  1  . simvastatin (ZOCOR) 40 MG tablet Take 1 tablet (40 mg total) by mouth at bedtime.  90 tablet  1  . sitaGLIPtin (JANUVIA) 100 MG tablet Take 1 tablet (100 mg total) by mouth daily.  30 tablet  2  . valACYclovir (VALTREX) 1000 MG tablet Take 1,000 mg by mouth 2 (two) times daily.       No current facility-administered medications on file prior to visit.    BP 140/88  Pulse 85  Temp(Src) 97.5 F (36.4 C) (Oral)  Resp 16  Wt 162 lb 1.3 oz (73.519 kg)  BMI 28.72 kg/m2  SpO2 98%       Objective:   Physical Exam  Constitutional: She is oriented to person, place, and time. She appears well-developed and well-nourished. No distress.  HENT:  Head: Normocephalic and atraumatic.  R TM is occluded by cerumen. L TM is normal.   Mild oropharyngeal erythema without exudates.   + frontal and maxillary sinus tenderness to palpation bilaterally   Cardiovascular: Normal rate and regular rhythm.   No murmur heard. Pulmonary/Chest: Effort normal and breath sounds normal. No respiratory distress. She has no wheezes. She has no rales. She exhibits no tenderness.  Musculoskeletal: She exhibits no edema.  Lymphadenopathy:    She has no cervical adenopathy.  Neurological: She is alert and oriented to person, place, and time.  Psychiatric: She has a normal mood and affect. Her behavior is normal. Judgment and thought content normal.          Assessment & Plan:

## 2013-04-25 NOTE — Addendum Note (Signed)
Addended by: Mervin Kung A on: 04/25/2013 04:08 PM   Modules accepted: Orders

## 2013-04-25 NOTE — Assessment & Plan Note (Signed)
New. Obtain MBS.

## 2013-04-25 NOTE — Assessment & Plan Note (Signed)
Suspect sinusitis superimposed on allergic rhinitis.  Will have pt restart zyrtec- she is not taking.  Add flonase. Rx with amoxicillin.

## 2013-04-26 ENCOUNTER — Other Ambulatory Visit: Payer: Self-pay | Admitting: Family

## 2013-04-28 ENCOUNTER — Ambulatory Visit: Payer: BC Managed Care – PPO | Admitting: Physician Assistant

## 2013-05-03 ENCOUNTER — Telehealth: Payer: Self-pay | Admitting: *Deleted

## 2013-05-03 NOTE — Telephone Encounter (Signed)
Notified pt. 

## 2013-05-03 NOTE — Telephone Encounter (Signed)
I will see her monday 

## 2013-05-03 NOTE — Telephone Encounter (Signed)
Received call from pt stating she has been having intermittent spotting x 2 weeks. States she has been wearing a panty liner and has not had any bleeding today. Pt doesn't feel that her herpes is clearing up and is wondering if the spotting is coming from that as she feels very irritated.  Pt has upcoming appt on Monday and wants to know if she can wait for evalutation until then or do you want to see her sooner?

## 2013-05-06 ENCOUNTER — Ambulatory Visit: Payer: BC Managed Care – PPO | Admitting: Family

## 2013-05-09 ENCOUNTER — Encounter: Payer: Self-pay | Admitting: Family

## 2013-05-09 ENCOUNTER — Other Ambulatory Visit (HOSPITAL_COMMUNITY): Payer: Self-pay | Admitting: Family

## 2013-05-09 ENCOUNTER — Ambulatory Visit (INDEPENDENT_AMBULATORY_CARE_PROVIDER_SITE_OTHER): Payer: BC Managed Care – PPO | Admitting: Family

## 2013-05-09 VITALS — BP 122/80 | HR 79 | Temp 97.7°F | Resp 16 | Ht 63.5 in | Wt 153.0 lb

## 2013-05-09 DIAGNOSIS — E119 Type 2 diabetes mellitus without complications: Secondary | ICD-10-CM

## 2013-05-09 DIAGNOSIS — R1310 Dysphagia, unspecified: Secondary | ICD-10-CM

## 2013-05-09 DIAGNOSIS — E1142 Type 2 diabetes mellitus with diabetic polyneuropathy: Secondary | ICD-10-CM

## 2013-05-09 DIAGNOSIS — E1149 Type 2 diabetes mellitus with other diabetic neurological complication: Secondary | ICD-10-CM

## 2013-05-09 DIAGNOSIS — E785 Hyperlipidemia, unspecified: Secondary | ICD-10-CM

## 2013-05-09 DIAGNOSIS — I10 Essential (primary) hypertension: Secondary | ICD-10-CM

## 2013-05-09 MED ORDER — VALACYCLOVIR HCL 1 G PO TABS: 1000.0000 mg | ORAL_TABLET | Freq: Every day | ORAL | Status: DC

## 2013-05-09 NOTE — Progress Notes (Signed)
Subjective:    Patient ID: Crystal [name], female    DOB: 1951/01/21, 62 y.o.   MRN: [id number]  HPI  Ms. Crystal [name] is a 62 yr old female who presents today for follow up of multiple medical problems:  1) DM2- currently maintained on Januvia and invokana. Her last A1C was 6.3.    2) HTN- currently maintained on low dose lisinopril only.  3) hyperlipidemia- maintained on simvastatin.  Review of Systems    see HPI Past Medical History  Diagnosis Date  . Hypertension   . Hyperlipemia   . Dyspepsia   . OSA (obstructive sleep apnea)   . Plantar fasciitis   . Depression   . Asthma     02/08/10 FEV1 1.98 (80%), s/p saba 2.22 l/m (90%).nml  . Anxiety   . 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 Fatty liver 12/19/2010  . Internal hemorrhoids   . Diverticulosis   . Allergy     dust, cock roach,grass,weeds,molds  . GERD (gastroesophageal reflux disease)   . Acute peptic ulcer of stomach     As a teenager  . IBS (irritable bowel syndrome)     History   Social History  . Marital Status: Married    Spouse Name: [name]    Number of Children: 4  . 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 Smoking status: Never Smoker   . Smokeless tobacco: Never Used  . Alcohol Use: No  . Drug Use: No  . Sexual Activity: Not on file   Other Topics Concern  . Not on file   Social History Narrative   No caffeine drinks daily     Past Surgical History  Procedure Laterality Date  . Knee arthroscopy  1998    left knee  . Toe surgery  1995    right  . Nasal septum surgery    . Rhinoplasty    . Appendectomy      age 85  . Colonoscopy  06/03/2010, 08/25/2011    diverticulosis, internal and external hemorrhoids, random biopsies negative in 2013  . Gastrectomy      partial - ulcer as a teen    Family History  Problem Relation Age of Onset  . Diabetes Mother     type II  . Heart attack Mother 81  . Ulcers      bleeding  gastric  . Colon cancer Sister   . Heart defect      child  . Other      perforated bowel, child    Allergies  Allergen Reactions  . Prochlorperazine Edisylate Other (See Comments)    coma for 3 days  . Tetanus Toxoid Other (See Comments)    convulsions    Current Outpatient Prescriptions on File Prior to Visit  Medication Sig Dispense Refill  . albuterol (PROAIR HFA) 108 (90 BASE) MCG/ACT inhaler Inhale 2 puffs into the lungs every 4 (four) hours as needed. Shortness of breath and wheezing  18 g  2  . aspirin 81 MG tablet Take 81 mg by mouth daily.      . Blood Glucose Monitoring Suppl (ACCU-CHEK AVIVA PLUS) W/DEVICE KIT 1 kit by Does not apply route daily. DX 250.00  1 kit  0  . Canagliflozin (INVOKANA) 100 MG TABS Take 1 tablet (100 mg total) by mouth daily.  30 tablet  3  . cetirizine (ZYRTEC) 10 MG tablet Take  10 mg by mouth daily.      . clotrimazole (LOTRIMIN) 1 % cream Apply to affected area 2 times daily x 2 weeks  15 g  0  . fluticasone (FLONASE) 50 MCG/ACT nasal spray Place 2 sprays into the nose daily.  16 g  3  . glucose blood (ACCU-CHEK AVIVA PLUS) test strip Use to check blood sugar one to two times daily as instructed.  DX 250.00  100 each  2  . JANUVIA 100 MG tablet TAKE 1 TABLET BY MOUTH ONCE DAILY  30 tablet  3  . Lancets (ACCU-CHEK MULTICLIX) lancets Use to check blood sugar once daily as instructed.  DX 250.00  100 each  2  . lisinopril (PRINIVIL,ZESTRIL) 2.5 MG tablet Take 1 tablet (2.5 mg total) by mouth daily.  30 tablet  2  . LOTRONEX 1 MG tablet Take 1 mg by mouth daily.       . mometasone (NASONEX) 50 MCG/ACT nasal spray Place 2 sprays into the nose daily.  17 g  6  . Multiple Vitamins-Minerals (CENTRUM SILVER PO) Take 1 tablet by mouth daily.        . ondansetron (ZOFRAN) 4 MG tablet Take 1 tablet (4 mg total) by mouth every 8 (eight) hours as needed for nausea.  60 tablet  1  . Probiotic Product (ALIGN) 4 MG CAPS Take 1 capsule by mouth daily.        .  ranitidine (ZANTAC) 150 MG capsule Take 1 capsule (150 mg total) by mouth 2 (two) times daily.  180 capsule  1  . sertraline (ZOLOFT) 50 MG tablet TAKE 1 TABLET BY MOUTH ONCE DAILY  30 tablet  3  . simvastatin (ZOCOR) 40 MG tablet Take 1 tablet (40 mg total) by mouth at bedtime.  90 tablet  1  . valACYclovir (VALTREX) 1000 MG tablet Take 1,000 mg by mouth 2 (two) times daily.       No current facility-administered medications on file prior to visit.    BP 122/80  Pulse 79  Temp(Src) 97.7 F (36.5 C) (Oral)  Resp 16  Ht 5' 3.5" (1.613 m)  Wt 153 lb 0.6 oz (69.418 kg)  BMI 26.68 kg/m2  SpO2 99%    Objective:   Physical Exam  Constitutional: She is oriented to person, place, and time. She appears well-developed and well-nourished. No distress.  Cardiovascular: Normal rate and regular rhythm.   No murmur heard. Pulmonary/Chest: Effort normal and breath sounds normal. No respiratory distress. She has no wheezes. She has no rales. She exhibits no tenderness.  Musculoskeletal: She exhibits no edema.  Neurological: She is alert and oriented to person, place, and time.  Psychiatric: She has a normal mood and affect. Her behavior is normal. Judgment and thought content normal.          Assessment & Plan:

## 2013-05-09 NOTE — Patient Instructions (Signed)
Please complete lab work prior to leaving. Follow up in 3 months.  

## 2013-05-10 ENCOUNTER — Ambulatory Visit (HOSPITAL_COMMUNITY)
Admission: RE | Admit: 2013-05-10 | Discharge: 2013-05-10 | Disposition: A | Discharge status: Home / Self Care | Payer: BC Managed Care – PPO | Source: Ambulatory Visit | Attending: Family | Admitting: Family

## 2013-05-10 DIAGNOSIS — R1310 Dysphagia, unspecified: Secondary | ICD-10-CM | POA: Insufficient documentation

## 2013-05-10 NOTE — Procedures (Signed)
Objective Swallowing Evaluation: Modified Barium Swallowing Study  Patient Details  Name: Crystal [name] MRN: [id number] Date of Birth: 24-Sep-1950  Today's Date: 05/10/2013 Time: 3086-5784 SLP Time Calculation (min): 23 min  Past Medical History:  Past Medical History  Diagnosis Date  . Hypertension   . Hyperlipemia   . Dyspepsia   . OSA (obstructive sleep apnea)   . Plantar fasciitis   . Depression   . Asthma     02/08/10 FEV1 1.98 (80%), s/p saba 2.22 l/m (90%).nml  . Anxiety   . 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 Fatty liver 12/19/2010  . Internal hemorrhoids   . Diverticulosis   . Allergy     dust, cock roach,grass,weeds,molds  . GERD (gastroesophageal reflux disease)   . Acute peptic ulcer of stomach     As a teenager  . IBS (irritable bowel syndrome)    Past Surgical History:  Past Surgical History  Procedure Laterality Date  . Knee arthroscopy  1998    left knee  . Toe surgery  1995    right  . Nasal septum surgery    . Rhinoplasty    . Appendectomy      age 38  . Colonoscopy  06/03/2010, 08/25/2011    diverticulosis, internal and external hemorrhoids, random biopsies negative in 2013  . Gastrectomy      partial - ulcer as a teen   HPI:  62 yr old seen for outpatient MBS with history of asthma, DM, GERD, HTN, OSA.  Pt. complains of coughing during and belching mostly after solids/liquids.  She also reported an episode of choking at age 17 to where "I almost passed out."  Symtpoms have been chronic but have been getting worse recently.     Assessment / Plan / Recommendation Clinical Impression  Dysphagia Diagnosis: Suspected primary esophageal dysphagia Clinical impression: Oral and phayrngeal swallow function were within functional limits.  Oral prep, mastication and transit was appropriate.  Pharyngeal initiation of swallow was timely without residuals with effective protection of trachea.  Brief esophageal scan revealed what appeared to be a hesitancy  with solid residual stopping mid esophagus.  Additional trial of thin barium assisted  transit to the GE junction.  Pt. did exhibit two somewhat unusual behaviors during mastication of cracker and pill trial.  She began to masticate cracker and after several seconds exhibited a severe cough almost gag like reflex, however no material was observed over tongue base or in the laryngeal vestibule at that time. Pt. consumed a barium pill with thin which lodged in her valleculae with immediate and violent expectoration, spewing barium on her clothes and floor.  Suspect pt. may be experiencing hypersensitive gag sensation/learned behaviors stemming from possible psychological fears of choking caused by a childhood incident (62 yrs old) where she "became choked and almost passed out."  Uncertain if pt. is experiecing any abnormal stress that would exacerbate although SLP did not inquire about this.  Pt. may have esophageal deficits that could contribute to a globus sensation and may benefit from further assessment.  Recommend continue regular diet texture and thin liquids, pills whole with liquid.  SLP educated pt. on reflux strategies and precautions.      Treatment Recommendation  No treatment recommended at this time    Diet Recommendation Regular;Thin liquid   Liquid Administration via: Cup;Straw Medication Administration: Whole meds with liquid Supervision: Patient able to self feed Compensations: Slow rate;Small sips/bites Postural Changes and/or Swallow Maneuvers: Seated upright 90  degrees;Upright 30-60 min after meal    Other  Recommendations Recommended Consults: Consider GI evaluation Oral Care Recommendations: Oral care BID   Follow Up Recommendations  None    Frequency and Duration        Pertinent Vitals/Pain n/a          Reason for Referral Objectively evaluate swallowing function   Oral Phase Oral Preparation/Oral Phase Oral Phase: WFL   Pharyngeal Phase Pharyngeal  Phase Pharyngeal Phase: Within functional limits  Cervical Esophageal Phase    GO    Cervical Esophageal Phase Cervical Esophageal Phase: [facility]    Functional Assessment Tool Used: clinical judgement Functional Limitations: Swallowing Swallow Current Status (Z6109): At least 1 percent but less than 20 percent impaired, limited or restricted Swallow Goal Status [phone]): At least 1 percent but less than 20 percent impaired, limited or restricted Swallow Discharge Status [phone]): At least 1 percent but less than 20 percent impaired, limited or restricted    Royce Macadamia M.Ed [employer] [phone]  05/10/2013

## 2013-05-11 ENCOUNTER — Telehealth: Payer: Self-pay | Admitting: Family

## 2013-05-11 DIAGNOSIS — R1310 Dysphagia, unspecified: Secondary | ICD-10-CM

## 2013-05-11 LAB — HEMOGLOBIN A1C
Hgb A1c MFr Bld: 6.8 % — ABNORMAL HIGH (ref <5.7)
Mean Plasma Glucose: 148 mg/dL — ABNORMAL HIGH (ref <117)

## 2013-05-11 NOTE — Telephone Encounter (Signed)
Left detailed message on cell and to call if any questions. 

## 2013-05-11 NOTE — Assessment & Plan Note (Signed)
Tolerating statin. Continue same. Return fasting for flp and lft.

## 2013-05-11 NOTE — Telephone Encounter (Signed)
Please call pt and let her know that swallow study looks ok.  I would like to refer her to GI for further evalation.

## 2013-05-11 NOTE — Assessment & Plan Note (Signed)
Currently stable on invokana and januvia.  Continue same. Obtain A1C.

## 2013-05-11 NOTE — Assessment & Plan Note (Signed)
BP stable on low dose ace, continue same.  BP Readings from Last 3 Encounters:  05/09/13 122/80  04/25/13 140/88  03/29/13 124/70

## 2013-05-12 LAB — LIPID PANEL
Cholesterol: 191 mg/dL (ref 0–200)
HDL: 54 mg/dL (ref >39)
LDL Cholesterol: 109 mg/dL — ABNORMAL HIGH (ref 0–99)
Total CHOL/HDL Ratio: 3.5 Ratio
Triglycerides: 138 mg/dL (ref <150)
VLDL: 28 mg/dL (ref 0–40)

## 2013-05-12 LAB — BASIC METABOLIC PANEL
CO2: 21 mEq/L (ref 19–32)
Glucose, Bld: 110 mg/dL — ABNORMAL HIGH (ref 70–99)
Potassium: 4.1 mEq/L (ref 3.5–5.3)
Sodium: 137 mEq/L (ref 135–145)

## 2013-05-12 LAB — HEPATIC FUNCTION PANEL
AST: 29 U/L (ref 0–37)
Albumin: 4.7 g/dL (ref 3.5–5.2)
Bilirubin, Direct: 0.1 mg/dL (ref 0.0–0.3)
Indirect Bilirubin: 0.6 mg/dL (ref 0.0–0.9)
Total Bilirubin: 0.7 mg/dL (ref 0.3–1.2)

## 2013-05-16 ENCOUNTER — Telehealth: Payer: Self-pay | Admitting: Family

## 2013-05-16 DIAGNOSIS — E785 Hyperlipidemia, unspecified: Secondary | ICD-10-CM

## 2013-05-16 MED ORDER — ATORVASTATIN CALCIUM 40 MG PO TABS: 40.0000 mg | ORAL_TABLET | Freq: Every day | ORAL | Status: DC

## 2013-05-16 NOTE — Telephone Encounter (Signed)
Please call pt and let her know sugar is at goal.  Cholesterol remains above goal. Stop simvastatin. Start atorvastatin 40mg  once daily. Follow up in 6 weeks for flp/lft dx is hyperlipidemia.

## 2013-05-16 NOTE — Telephone Encounter (Signed)
Notified pt and she voices understanding. Lab order entered. 

## 2013-05-31 ENCOUNTER — Encounter: Payer: Self-pay | Admitting: Family

## 2013-06-06 ENCOUNTER — Other Ambulatory Visit: Payer: Self-pay | Admitting: Family

## 2013-06-06 NOTE — Telephone Encounter (Signed)
Rx request to pharmacy/SLS  

## 2013-06-14 ENCOUNTER — Telehealth: Payer: Self-pay | Admitting: Internal Medicine

## 2013-06-14 NOTE — Telephone Encounter (Signed)
Left message for patient to call back  

## 2013-06-14 NOTE — Telephone Encounter (Signed)
Patient c/o watery mucous discharge in between BMs.  She is having some constipation.  She will come in and see Mike Gip PA tomorrow at 3:30

## 2013-06-15 ENCOUNTER — Encounter: Payer: Self-pay | Admitting: Physician Assistant

## 2013-06-15 ENCOUNTER — Ambulatory Visit (INDEPENDENT_AMBULATORY_CARE_PROVIDER_SITE_OTHER): Payer: BC Managed Care – PPO | Admitting: Physician Assistant

## 2013-06-15 VITALS — BP 140/80 | HR 76 | Ht 63.0 in | Wt 154.4 lb

## 2013-06-15 DIAGNOSIS — R198 Other specified symptoms and signs involving the digestive system and abdomen: Secondary | ICD-10-CM

## 2013-06-15 DIAGNOSIS — K589 Irritable bowel syndrome without diarrhea: Secondary | ICD-10-CM

## 2013-06-15 DIAGNOSIS — R194 Change in bowel habit: Secondary | ICD-10-CM

## 2013-06-15 DIAGNOSIS — R195 Other fecal abnormalities: Secondary | ICD-10-CM

## 2013-06-15 NOTE — Progress Notes (Signed)
Subjective:     Patient ID: Crystal [name], female   DOB: [birth year], 62 y.o.   MRN: [id number]  HPI Crystal [name] is a nice 62 year old white female known to Dr. Leone Payor who has history of IBS generally diarrhea predominant. She also has history of diabetes, asthma, depression, hyperlipidemia and sleep apnea. She was last in the office in September of 2014 and at that time with wanting to come off of Lotronex because her diarrhea symptoms have been well-controlled for multiple months. She says she stopped her Lotronex and did well until about 3 weeks ago when her stools had become progressively looser and she was having multiple bowel movements per day. She went back on Lotronex 2 per day and now over the past week has decreased to 1 Lotronex per day. She says this definitely controls the diarrhea and she's actually gotten constipated. She says now she's having a lot of straining and passage of pellet-like stools and has noted a increase in mucous in her stools as well there has been no bleeding. She has mild lower abdominal discomfort which she says is fairly constant for her. No complaints of rectal pain.   Last colonoscopy February 2013 normal with the exception of mild diverticulosis and internal hemorrhoids random biopsies were unremarkable   Review of Systems  Constitutional: Negative.   HENT: Negative.   Eyes: Negative.   Respiratory: Negative.   Gastrointestinal: Positive for abdominal pain and constipation.  Endocrine: Negative.   Genitourinary: Negative.   Musculoskeletal: Negative.   Allergic/Immunologic: Negative.   Neurological: Negative.   Hematological: Negative.   Psychiatric/Behavioral: Negative.    Outpatient Prescriptions Prior to Visit  Medication Sig Dispense Refill  . albuterol (PROAIR HFA) 108 (90 BASE) MCG/ACT inhaler Inhale 2 puffs into the lungs every 4 (four) hours as needed. Shortness of breath and wheezing  18 g  2  . aspirin 81 MG tablet Take 81 mg by mouth daily.       Marland Kitchen atorvastatin (LIPITOR) 40 MG tablet Take 1 tablet (40 mg total) by mouth daily.  30 tablet  2  . Blood Glucose Monitoring Suppl (ACCU-CHEK AVIVA PLUS) W/DEVICE KIT 1 kit by Does not apply route daily. DX 250.00  1 kit  0  . Canagliflozin (INVOKANA) 100 MG TABS Take 1 tablet (100 mg total) by mouth daily.  30 tablet  3  . cetirizine (ZYRTEC) 10 MG tablet Take 10 mg by mouth daily.      . clotrimazole (LOTRIMIN) 1 % cream Apply to affected area 2 times daily x 2 weeks  15 g  0  . fluticasone (FLONASE) 50 MCG/ACT nasal spray Place 2 sprays into the nose daily.  16 g  3  . glucose blood (ACCU-CHEK AVIVA PLUS) test strip Use to check blood sugar one to two times daily as instructed.  DX 250.00  100 each  2  . JANUVIA 100 MG tablet TAKE 1 TABLET BY MOUTH ONCE DAILY  30 tablet  3  . Lancets (ACCU-CHEK MULTICLIX) lancets Use to check blood sugar once daily as instructed.  DX 250.00  100 each  2  . lisinopril (PRINIVIL,ZESTRIL) 2.5 MG tablet TAKE 1 TABLET BY MOUTH ONCE DAILY  30 tablet  2  . LOTRONEX 1 MG tablet Take 1 mg by mouth daily.       . mometasone (NASONEX) 50 MCG/ACT nasal spray Place 2 sprays into the nose daily.  17 g  6  . Multiple Vitamins-Minerals (CENTRUM SILVER PO) Take 1 tablet by  mouth daily.        . ondansetron (ZOFRAN) 4 MG tablet Take 1 tablet (4 mg total) by mouth every 8 (eight) hours as needed for nausea.  60 tablet  1  . Probiotic Product (ALIGN) 4 MG CAPS Take 1 capsule by mouth daily.        . ranitidine (ZANTAC) 150 MG capsule Take 1 capsule (150 mg total) by mouth 2 (two) times daily.  180 capsule  1  . sertraline (ZOLOFT) 50 MG tablet TAKE 1 TABLET BY MOUTH ONCE DAILY  30 tablet  3  . valACYclovir (VALTREX) 1000 MG tablet Take 1 tablet (1,000 mg total) by mouth daily.  30 tablet  5   No facility-administered medications prior to visit.   Allergies  Allergen Reactions  . Compazine [Prochlorperazine Edisylate]   . Prochlorperazine Edisylate Other (See Comments)     coma for 3 days  . Tetanus Toxoid Other (See Comments)    convulsions   Patient Active Problem List   Diagnosis Date Noted  . Other dysphagia 04/25/2013  . Acute sinusitis 04/25/2013  . Allergic rhinitis 11/16/2012  . 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 Peripheral neuropathy 12/19/2011  . 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 GERD (gastroesophageal reflux disease) 03/07/2011  . Fatty liver 12/19/2010  . GENITAL HERPES 08/06/2010  . MAMMOGRAM, ABNORMAL 08/02/2010  . ABNORMAL ELECTROCARDIOGRAM 07/19/2010  . OBSTRUCTIVE SLEEP APNEA 06/18/2010  . DM (diabetes mellitus), type 2 with neurological complications 04/12/2010  . HYPERLIPIDEMIA 04/12/2010  . HYPERTENSION 04/12/2010  . ASTHMA 04/12/2010  . DEPRESSION/ANXIETY 03/13/2010   History  Substance Use Topics  . Smoking status: Never Smoker   . Smokeless tobacco: Never Used  . Alcohol Use: No      family history includes Colon cancer in her sister; Diabetes in her mother; Heart attack (age of onset: 20) in her mother; Heart defect in an other family member; Other in an other family member; Ulcers in an other family member.  Objective:   Physical Exam  Well-developed older white female in no acute distress. Blood pressure 140/80 pulse 76 height 5 foot 3 weight 154. HEENT ;nontraumatic normocephalic EOMI PERRLA sclera anicteric, Supple; no JVD, Cardiovascular; regular rate and rhythm with S1-S2 no murmur or gallop, Pulm; clear bilaterally, Abdomen; soft easily nontender nondistended no palpable mass or hepatosplenomegaly she does have a upper midline incisional scar bowel sounds are present, Rectal; exam there is inflamed external hemorrhoid nonthrombosed stool is brown no impaction no mucus noted. Ext; no clubbing ,   cyanosis or edema skin warm and dry, Psych; mood and affect appropriate     Assessment:  #50 62 year old female with IBS generally diarrhea predominant now with constipation straining and increased mucus  in stools while on  Lotronex #2 diverticulosis #3 symptomatic external hemorrhoid   Plan:  Stop Lotronex 64 days and then plan to resume on an every other day basis 1 tablet daily Patient reassured that mucus likely secondary to inflamed hemorrhoid and/or secondary to constipation  MiraLax 17 g in 8 ounces of water patient did take 1-2 doses this evening and then repeat again tomorrow to purge her bowels Continue a line one by mouth daily Anusol-HC cream 2-3 times daily for external hemorrhoid over the next 7-10 days then as needed Followup with Dr. Leone Payor as  needed

## 2013-06-15 NOTE — Patient Instructions (Signed)
Stop Lotronex medication for 4-5 days. Restart it, Take  every other day. Take 1-2 doses of Miralax today. Repeat tomorrow 12-4 as needed. Use the Preparation H for hemorrhoids.  Follow up with Dr. Leone Payor as needed.

## 2013-06-17 NOTE — Progress Notes (Signed)
Agree with Ms. Oswald Hillock assessment and plan. The patient needs to follow-up with me in the Spring on routine basis Iva Boop, MD, [facility]

## 2013-06-21 ENCOUNTER — Ambulatory Visit (INDEPENDENT_AMBULATORY_CARE_PROVIDER_SITE_OTHER): Payer: BC Managed Care – PPO | Admitting: Nurse Practitioner

## 2013-06-21 ENCOUNTER — Encounter: Payer: Self-pay | Admitting: Nurse Practitioner

## 2013-06-21 VITALS — BP 140/80 | HR 93 | Temp 97.7°F | Ht 63.5 in | Wt 155.5 lb

## 2013-06-21 DIAGNOSIS — J019 Acute sinusitis, unspecified: Secondary | ICD-10-CM

## 2013-06-21 MED ORDER — AMOXICILLIN-POT CLAVULANATE 875-125 MG PO TABS: 1.0000 | ORAL_TABLET | Freq: Two times a day (BID) | ORAL | Status: DC

## 2013-06-21 NOTE — Progress Notes (Signed)
Pre-visit discussion using our clinic review tool. No additional management support is needed unless otherwise documented below in the visit note.  

## 2013-06-21 NOTE — Patient Instructions (Signed)
Start neilmed sinus rinse daily. Take antibiotic. Eat yogurt daily. Use nasonex. Follow up in 2 weeks for ear. Feel better!  Sinusitis Sinusitis is redness, soreness, and swelling (inflammation) of the paranasal sinuses. Paranasal sinuses are air pockets within the bones of your face (beneath the eyes, the middle of the forehead, or above the eyes). In healthy paranasal sinuses, mucus is able to drain out, and air is able to circulate through them by way of your nose. However, when your paranasal sinuses are inflamed, mucus and air can become trapped. This can allow bacteria and other germs to grow and cause infection. Sinusitis can develop quickly and last only a short time (acute) or continue over a long period (chronic). Sinusitis that lasts for more than 12 weeks is considered chronic.  CAUSES  Causes of sinusitis include:  Allergies.  Structural abnormalities, such as displacement of the cartilage that separates your nostrils (deviated septum), which can decrease the air flow through your nose and sinuses and affect sinus drainage.  Functional abnormalities, such as when the small hairs (cilia) that line your sinuses and help remove mucus do not work properly or are not present. SYMPTOMS  Symptoms of acute and chronic sinusitis are the same. The primary symptoms are pain and pressure around the affected sinuses. Other symptoms include:  Upper toothache.  Earache.  Headache.  Bad breath.  Decreased sense of smell and taste.  A cough, which worsens when you are lying flat.  Fatigue.  Fever.  Thick drainage from your nose, which often is green and may contain pus (purulent).  Swelling and warmth over the affected sinuses. DIAGNOSIS  Your caregiver will perform a physical exam. During the exam, your caregiver may:  Look in your nose for signs of abnormal growths in your nostrils (nasal polyps).  Tap over the affected sinus to check for signs of infection.  View the inside  of your sinuses (endoscopy) with a special imaging device with a light attached (endoscope), which is inserted into your sinuses. If your caregiver suspects that you have chronic sinusitis, one or more of the following tests may be recommended:  Allergy tests.  Nasal culture A sample of mucus is taken from your nose and sent to a lab and screened for bacteria.  Nasal cytology A sample of mucus is taken from your nose and examined by your caregiver to determine if your sinusitis is related to an allergy. TREATMENT  Most cases of acute sinusitis are related to a viral infection and will resolve on their own within 10 days. Sometimes medicines are prescribed to help relieve symptoms (pain medicine, decongestants, nasal steroid sprays, or saline sprays).  However, for sinusitis related to a bacterial infection, your caregiver will prescribe antibiotic medicines. These are medicines that will help kill the bacteria causing the infection.  Rarely, sinusitis is caused by a fungal infection. In theses cases, your caregiver will prescribe antifungal medicine. For some cases of chronic sinusitis, surgery is needed. Generally, these are cases in which sinusitis recurs more than 3 times per year, despite other treatments. HOME CARE INSTRUCTIONS   Drink plenty of water. Water helps thin the mucus so your sinuses can drain more easily.  Use a humidifier.  Inhale steam 3 to 4 times a day (for example, sit in the bathroom with the shower running).  Apply a warm, moist washcloth to your face 3 to 4 times a day, or as directed by your caregiver.  Use saline nasal sprays to help moisten and clean  your sinuses.  Take over-the-counter or prescription medicines for pain, discomfort, or fever only as directed by your caregiver. SEEK IMMEDIATE MEDICAL CARE IF:  You have increasing pain or severe headaches.  You have nausea, vomiting, or drowsiness.  You have swelling around your face.  You have vision  problems.  You have a stiff neck.  You have difficulty breathing. MAKE SURE YOU:   Understand these instructions.  Will watch your condition.  Will get help right away if you are not doing well or get worse. Document Released: 06/30/2005 Document Revised: 09/22/2011 Document Reviewed: 07/15/2011 [facility] Patient Information 2014 [place], [place].

## 2013-06-21 NOTE — Progress Notes (Signed)
   Subjective:    Patient ID: Crystal [name], female    DOB: 16-Jun-1951, 62 y.o.   MRN: [id number]  URI  This is a chronic problem. The current episode started more than 1 month ago (6 wks). The problem has been waxing and waning. There has been no fever. Associated symptoms include congestion, ear pain, headaches and sinus pain. Pertinent negatives include no abdominal pain, chest pain, coughing, diarrhea, nausea, neck pain, sore throat, swollen glands, vomiting or wheezing. She has tried nothing for the symptoms.      Review of Systems  Constitutional: Positive for fatigue. Negative for fever, chills, activity change and appetite change.  HENT: Positive for congestion, ear pain and sinus pressure. Negative for sore throat.   Respiratory: Negative for cough, chest tightness and wheezing.   Cardiovascular: Negative for chest pain.  Gastrointestinal: Negative for nausea, vomiting, abdominal pain and diarrhea.  Musculoskeletal: Negative for back pain and neck pain.  Neurological: Positive for headaches. Negative for dizziness.  Hematological: Negative for adenopathy.       Objective:   Physical Exam  Vitals reviewed. Constitutional: She is oriented to person, place, and time. She appears well-developed and well-nourished. No distress.  HENT:  Head: Normocephalic and atraumatic.  Right Ear: External ear and ear canal normal. Tympanic membrane is perforated. A middle ear effusion (purulent) is present.  Left Ear: External ear and ear canal normal. Tympanic membrane is retracted. Tympanic membrane is not injected. A middle ear effusion is present.  Ears:  Mouth/Throat: Oropharynx is clear and moist. No oropharyngeal exudate.  Removed dried ball from R canal-looks like mixture of cerumen & drainage from middle ear  Eyes: Conjunctivae are normal. Right eye exhibits no discharge. Left eye exhibits no discharge.  Neck: Normal range of motion. Neck supple. No thyromegaly present.    Cardiovascular: Normal rate, regular rhythm and normal heart sounds.   No murmur heard. Pulmonary/Chest: Effort normal and breath sounds normal. No respiratory distress. She has no wheezes.  Lymphadenopathy:    She has no cervical adenopathy.  Neurological: She is alert and oriented to person, place, and time.  Skin: Skin is warm and dry.  Psychiatric: She has a normal mood and affect. Her behavior is normal. Thought content normal.          Assessment & Plan:  1. Acute sinusitis  - amoxicillin-clavulanate (AUGMENTIN) 875-125 MG per tablet; Take 1 tablet by mouth 2 (two) times daily.  Dispense: 10 tablet; Refill: 0  2. R TM perforation, chronic. Repair failure. Purulent effusion.

## 2013-07-05 ENCOUNTER — Ambulatory Visit (INDEPENDENT_AMBULATORY_CARE_PROVIDER_SITE_OTHER): Payer: BC Managed Care – PPO | Admitting: Family

## 2013-07-05 ENCOUNTER — Encounter: Payer: Self-pay | Admitting: Family

## 2013-07-05 VITALS — BP 144/86 | HR 77 | Temp 97.6°F | Resp 18 | Ht 63.5 in | Wt 156.0 lb

## 2013-07-05 DIAGNOSIS — J019 Acute sinusitis, unspecified: Secondary | ICD-10-CM

## 2013-07-05 MED ORDER — CEFDINIR 300 MG PO CAPS: 300.0000 mg | ORAL_CAPSULE | Freq: Two times a day (BID) | ORAL | Status: DC

## 2013-07-05 NOTE — Progress Notes (Signed)
Subjective:    Patient ID: Crystal [name], female    DOB: December 21, 1950, 62 y.o.   MRN: [id number]  HPI  Crystal [name] is a 62 yr old female who presents today with chief complaint of sinus congestion/clear nasal drainage, sneezing, ear discomfort. Eyes "hurt and water."  Denies associated fever. Treated with a 10 day course of augmentin on 12/9-12/19, with only brief improvement.    Review of Systems See HPI  Past Medical History  Diagnosis Date  . Hypertension   . Hyperlipemia   . Dyspepsia   . OSA (obstructive sleep apnea)   . Plantar fasciitis   . Depression   . Asthma     02/08/10 FEV1 1.98 (80%), s/p saba 2.22 l/m (90%).nml  . Anxiety   . 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 Fatty liver 12/19/2010  . Internal hemorrhoids   . Diverticulosis   . Allergy     dust, cock roach,grass,weeds,molds  . GERD (gastroesophageal reflux disease)   . Acute peptic ulcer of stomach     As a teenager  . IBS (irritable bowel syndrome)     History   Social History  . Marital Status: Married    Spouse Name: [name]    Number of Children: 4  . 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 Smoking status: Never Smoker   . Smokeless tobacco: Never Used  . Alcohol Use: No  . Drug Use: No  . Sexual Activity: Not on file   Other Topics Concern  . Not on file   Social History Narrative   No caffeine drinks daily     Past Surgical History  Procedure Laterality Date  . Knee arthroscopy  1998    left knee  . Toe surgery  1995    right  . Nasal septum surgery    . Rhinoplasty    . Appendectomy      age 72  . Colonoscopy  06/03/2010, 08/25/2011    diverticulosis, internal and external hemorrhoids, random biopsies negative in 2013  . Gastrectomy      partial - ulcer as a teen    Family History  Problem Relation Age of Onset  . Diabetes Mother     type II  . Heart attack Mother 32  . Ulcers      bleeding gastric  .  Colon cancer Sister   . Heart defect      child  . Other      perforated bowel, child    Allergies  Allergen Reactions  . Compazine [Prochlorperazine Edisylate]   . Prochlorperazine Edisylate Other (See Comments)    coma for 3 days  . Tetanus Toxoid Other (See Comments)    convulsions    Current Outpatient Prescriptions on File Prior to Visit  Medication Sig Dispense Refill  . albuterol (PROAIR HFA) 108 (90 BASE) MCG/ACT inhaler Inhale 2 puffs into the lungs every 4 (four) hours as needed. Shortness of breath and wheezing  18 g  2  . aspirin 81 MG tablet Take 81 mg by mouth daily.      Marland Kitchen atorvastatin (LIPITOR) 40 MG tablet Take 1 tablet (40 mg total) by mouth daily.  30 tablet  2  . Blood Glucose Monitoring Suppl (ACCU-CHEK AVIVA PLUS) W/DEVICE KIT 1 kit by Does not apply route daily. DX 250.00  1 kit  0  . Canagliflozin (INVOKANA) 100 MG TABS  Take 1 tablet (100 mg total) by mouth daily.  30 tablet  3  . cetirizine (ZYRTEC) 10 MG tablet Take 10 mg by mouth daily.      . clotrimazole (LOTRIMIN) 1 % cream Apply to affected area 2 times daily x 2 weeks  15 g  0  . fluticasone (FLONASE) 50 MCG/ACT nasal spray Place 2 sprays into the nose daily.  16 g  3  . glucose blood (ACCU-CHEK AVIVA PLUS) test strip Use to check blood sugar one to two times daily as instructed.  DX 250.00  100 each  2  . JANUVIA 100 MG tablet TAKE 1 TABLET BY MOUTH ONCE DAILY  30 tablet  3  . Lancets (ACCU-CHEK MULTICLIX) lancets Use to check blood sugar once daily as instructed.  DX 250.00  100 each  2  . lisinopril (PRINIVIL,ZESTRIL) 2.5 MG tablet TAKE 1 TABLET BY MOUTH ONCE DAILY  30 tablet  2  . LOTRONEX 1 MG tablet Take 1 mg by mouth daily.       . mometasone (NASONEX) 50 MCG/ACT nasal spray Place 2 sprays into the nose daily.  17 g  6  . Multiple Vitamins-Minerals (CENTRUM SILVER PO) Take 1 tablet by mouth daily.        . ondansetron (ZOFRAN) 4 MG tablet Take 1 tablet (4 mg total) by mouth every 8 (eight) hours  as needed for nausea.  60 tablet  1  . Probiotic Product (ALIGN) 4 MG CAPS Take 1 capsule by mouth daily.        . ranitidine (ZANTAC) 150 MG capsule Take 1 capsule (150 mg total) by mouth 2 (two) times daily.  180 capsule  1  . sertraline (ZOLOFT) 50 MG tablet TAKE 1 TABLET BY MOUTH ONCE DAILY  30 tablet  3  . simvastatin (ZOCOR) 40 MG tablet       . valACYclovir (VALTREX) 1000 MG tablet Take 1 tablet (1,000 mg total) by mouth daily.  30 tablet  5   No current facility-administered medications on file prior to visit.    BP 144/86  Pulse 77  Temp(Src) 97.6 F (36.4 C) (Oral)  Resp 18  Ht 5' 3.5" (1.613 m)  Wt 156 lb 0.6 oz (70.779 kg)  BMI 27.20 kg/m2  SpO2 99%       Objective:   Physical Exam  Constitutional: She is oriented to person, place, and time. She appears well-developed and well-nourished. No distress.  HENT:  Head: Normocephalic and atraumatic.  Left Ear: Tympanic membrane and ear canal normal.  + perforation right TM  Cardiovascular: Normal rate and regular rhythm.   No murmur heard. Pulmonary/Chest: Effort normal and breath sounds normal. No respiratory distress. She has no wheezes. She has no rales. She exhibits no tenderness.  Neurological: She is alert and oriented to person, place, and time.  Skin: Skin is warm and dry.  Psychiatric: She has a normal mood and affect. Her behavior is normal. Judgment and thought content normal.          Assessment & Plan:

## 2013-07-05 NOTE — Patient Instructions (Signed)
Please call if symptoms worsen, or if not improved in 2-3 days.  

## 2013-07-05 NOTE — Assessment & Plan Note (Signed)
Will rx with cefdinir. Continue zyrtec, nasonex, sinus rinses.

## 2013-07-05 NOTE — Progress Notes (Signed)
Pre visit review using our clinic review tool, if applicable. No additional management support is needed unless otherwise documented below in the visit note. 

## 2013-07-12 ENCOUNTER — Telehealth: Payer: Self-pay | Admitting: *Deleted

## 2013-07-12 ENCOUNTER — Other Ambulatory Visit: Payer: Self-pay | Admitting: Family Medicine

## 2013-07-12 DIAGNOSIS — J329 Chronic sinusitis, unspecified: Secondary | ICD-10-CM

## 2013-07-12 NOTE — Telephone Encounter (Signed)
Notified pt and she has already been contacted re: appt.

## 2013-07-12 NOTE — Telephone Encounter (Signed)
Pt called stating she has had nasal congestion, sneezing and watery eyes since the first of this month. Pt has had 2 coursed of antibiotics and reports no improvement of symptoms. Pt has an allergist but was questioning if she should see an ENT? Pt states she just wants some relief.  Please advise.

## 2013-07-12 NOTE — Telephone Encounter (Signed)
Put in referral.  

## 2013-07-19 ENCOUNTER — Ambulatory Visit (INDEPENDENT_AMBULATORY_CARE_PROVIDER_SITE_OTHER): Payer: 59 | Admitting: Internal Medicine

## 2013-07-19 ENCOUNTER — Encounter: Payer: Self-pay | Admitting: Internal Medicine

## 2013-07-19 VITALS — BP 96/70 | HR 84 | Ht 63.0 in | Wt 156.2 lb

## 2013-07-19 DIAGNOSIS — R1310 Dysphagia, unspecified: Secondary | ICD-10-CM | POA: Insufficient documentation

## 2013-07-19 DIAGNOSIS — K589 Irritable bowel syndrome without diarrhea: Secondary | ICD-10-CM | POA: Insufficient documentation

## 2013-07-19 NOTE — Patient Instructions (Signed)
You have been scheduled for an endoscopy with propofol. Please follow written instructions given to you at your visit today. If you use inhalers (even only as needed), please bring them with you on the day of your procedure.  Hold your diabetic medicines the day of the procedure.   I appreciate the opportunity to care for you.

## 2013-07-19 NOTE — Progress Notes (Signed)
Subjective:    Patient ID: Crystal [name], female    DOB: [birth year], 63 y.o.   MRN: [id number]  HPI The patient is here for followup. She became constipated in December and had her Lotronex adjusted by Nicoletta Ba PA seen taking it every other day seems to be working out well. Another problem is developed is been intermittent solid and liquid dysphagia. Modified barium swallow test was performed and I reviewed those results. There was suspicion for some stasis and perhaps problem in the esophagus itself. She did have an episode of gagging and choking during the procedure and the speech pathologist thought that it might have been related to a bad memory of choking when she was a child as that has concerned her. She is on ranitidine. There is some heartburn but not much. Allergies  Allergen Reactions  . Compazine [Prochlorperazine Edisylate]   . Prochlorperazine Edisylate Other (See Comments)    coma for 3 days  . Tetanus Toxoid Other (See Comments)    convulsions   Outpatient Prescriptions Prior to Visit  Medication Sig Dispense Refill  . albuterol (PROAIR HFA) 108 (90 BASE) MCG/ACT inhaler Inhale 2 puffs into the lungs every 4 (four) hours as needed. Shortness of breath and wheezing  18 g  2  . aspirin 81 MG tablet Take 81 mg by mouth daily.      Marland Kitchen atorvastatin (LIPITOR) 40 MG tablet Take 1 tablet (40 mg total) by mouth daily.  30 tablet  2  . Blood Glucose Monitoring Suppl (ACCU-CHEK AVIVA PLUS) W/DEVICE KIT 1 kit by Does not apply route daily. DX 250.00  1 kit  0  . Canagliflozin (INVOKANA) 100 MG TABS Take 1 tablet (100 mg total) by mouth daily.  30 tablet  3  . cefdinir (OMNICEF) 300 MG capsule Take 1 capsule (300 mg total) by mouth 2 (two) times daily.  20 capsule  0  . cetirizine (ZYRTEC) 10 MG tablet Take 10 mg by mouth daily.      . clotrimazole (LOTRIMIN) 1 % cream Apply to affected area 2 times daily x 2 weeks  15 g  0  . fluticasone (FLONASE) 50 MCG/ACT nasal spray  Place 2 sprays into the nose daily.  16 g  3  . glucose blood (ACCU-CHEK AVIVA PLUS) test strip Use to check blood sugar one to two times daily as instructed.  DX 250.00  100 each  2  . JANUVIA 100 MG tablet TAKE 1 TABLET BY MOUTH ONCE DAILY  30 tablet  3  . Lancets (ACCU-CHEK MULTICLIX) lancets Use to check blood sugar once daily as instructed.  DX 250.00  100 each  2  . lisinopril (PRINIVIL,ZESTRIL) 2.5 MG tablet TAKE 1 TABLET BY MOUTH ONCE DAILY  30 tablet  2  . LOTRONEX 1 MG tablet Take 1 mg by mouth daily.       . mometasone (NASONEX) 50 MCG/ACT nasal spray Place 2 sprays into the nose daily.  17 g  6  . Multiple Vitamins-Minerals (CENTRUM SILVER PO) Take 1 tablet by mouth daily.        . ondansetron (ZOFRAN) 4 MG tablet Take 1 tablet (4 mg total) by mouth every 8 (eight) hours as needed for nausea.  60 tablet  1  . Probiotic Product (ALIGN) 4 MG CAPS Take 1 capsule by mouth daily.        . ranitidine (ZANTAC) 150 MG capsule Take 1 capsule (150 mg total)  by mouth 2 (two) times daily.  180 capsule  1  . sertraline (ZOLOFT) 50 MG tablet TAKE 1 TABLET BY MOUTH ONCE DAILY  30 tablet  3  . simvastatin (ZOCOR) 40 MG tablet       . valACYclovir (VALTREX) 1000 MG tablet Take 1 tablet (1,000 mg total) by mouth daily.  30 tablet  5   No facility-administered medications prior to visit.   Past Medical History  Diagnosis Date  . Hypertension   . Hyperlipemia   . Dyspepsia   . OSA (obstructive sleep apnea)   . Plantar fasciitis   . Depression   . Asthma     02/08/10 FEV1 1.98 (80%), s/p saba 2.22 l/m (90%).nml  . Anxiety   . 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 Fatty liver 12/19/2010  . Internal hemorrhoids   . Diverticulosis   . Allergy     dust, cock roach,grass,weeds,molds  . GERD (gastroesophageal reflux disease)   . Acute peptic ulcer of stomach     As a teenager  . IBS (irritable bowel syndrome)    Past Surgical History  Procedure Laterality Date  . Knee arthroscopy Left 1998  .  Toe surgery Right 1995  . Nasal septum surgery    . Rhinoplasty    . Appendectomy      age 48  . Colonoscopy  06/03/2010, 08/25/2011    diverticulosis   . Gastrectomy      partial - ulcer as a teen   Review of Systems As above    Objective:   Physical Exam General:  NAD Eyes:   Anicteric Neck:  Supple, no mass Lungs:  clear Heart:  S1S2 no rubs, murmurs or gallops   Assessment & Plan:   1. Dysphagia, unspecified(787.20)

## 2013-07-19 NOTE — Assessment & Plan Note (Addendum)
That causes not entirely clear. Essentially her modified barium swallow is okay with respect to the oropharyngeal mechanism. Upper GI endoscopy with possible esophageal dilation is recommended. The risks and benefits as well as alternatives of endoscopic procedure(s) have been discussed and reviewed. All questions answered. The patient agrees to proceed.

## 2013-07-19 NOTE — Assessment & Plan Note (Signed)
She is doing well with every other day Lotronex at this time. She will continue.

## 2013-07-21 ENCOUNTER — Encounter: Payer: Self-pay | Admitting: Internal Medicine

## 2013-07-21 ENCOUNTER — Ambulatory Visit (AMBULATORY_SURGERY_CENTER): Payer: 59 | Admitting: Internal Medicine

## 2013-07-21 VITALS — BP 144/81 | HR 72 | Temp 98.0°F | Resp 19 | Ht 63.0 in | Wt 156.0 lb

## 2013-07-21 DIAGNOSIS — K2100 Gastro-esophageal reflux disease with esophagitis, without bleeding: Secondary | ICD-10-CM

## 2013-07-21 DIAGNOSIS — R1310 Dysphagia, unspecified: Secondary | ICD-10-CM

## 2013-07-21 MED ORDER — SODIUM CHLORIDE 0.9 % IV SOLN: 500.0000 mL | INTRAVENOUS | Status: DC

## 2013-07-21 MED ORDER — PANTOPRAZOLE SODIUM 40 MG PO TBEC: 40.0000 mg | DELAYED_RELEASE_TABLET | Freq: Every day | ORAL | Status: DC

## 2013-07-21 NOTE — Progress Notes (Signed)
Report to pacu rn, vss, bbs=clear 

## 2013-07-21 NOTE — Patient Instructions (Addendum)
The esophagus had some mild inflammation from acid reflux. Otherwise looked ok. I dilated the esophagus to help your swallowing.  Stop ranitidine and start taking pantoprazole 40 mg every AM before breakfast to treat reflux and prevent further swallowing problems.  If you fail to improve please call me back.  I appreciate the opportunity to care for you. Gatha Mayer, MD, FACG   YOU HAD AN ENDOSCOPIC PROCEDURE TODAY AT [facility]: Refer to the procedure report that was given to you for any specific questions about what was found during the examination.  If the procedure report does not answer your questions, please call your gastroenterologist to clarify.  If you requested that your care partner not be given the details of your procedure findings, then the procedure report has been included in a sealed envelope for you to review at your convenience later.  YOU SHOULD EXPECT: Some feelings of bloating in the abdomen. Passage of more gas than usual.  Walking can help get rid of the air that was put into your GI tract during the procedure and reduce the bloating  DIET: FOLLOW DILATION DIET TODAY- SEE HANDOUT Drink plenty of fluids but you should avoid alcoholic beverages for 24 hours.  ACTIVITY: Your care partner should take you home directly after the procedure.  You should plan to take it easy, moving slowly for the rest of the day.  You can resume normal activity the day after the procedure however you should NOT DRIVE or use heavy machinery for 24 hours (because of the sedation medicines used during the test).    SYMPTOMS TO REPORT IMMEDIATELY: A gastroenterologist can be reached at any hour.  During normal business hours, 8:30 AM to 5:00 PM Monday through Friday, call [phone].  After hours and on weekends, please call the GI answering service at [phone] who will take a message and have the physician on call contact you.   Following upper endoscopy  (EGD)  Vomiting of blood or coffee ground material  New chest pain or pain under the shoulder blades  Painful or persistently difficult swallowing  New shortness of breath  Fever of 100F or higher  Black, tarry-looking stools  FOLLOW UP: Our staff will call the home number listed on your records the next business day following your procedure to check on you and address any questions or concerns that you may have at that time regarding the information given to you following your procedure. This is a courtesy call and so if there is no answer at the home number and we have not heard from you through the emergency physician on call, we will assume that you have returned to your regular daily activities without incident.  SIGNATURES/CONFIDENTIALITY: You and/or your care partner have signed paperwork which will be entered into your electronic medical record.  These signatures attest to the fact that that the information above on your After Visit Summary has been reviewed and is understood.  Full responsibility of the confidentiality of this discharge information lies with you and/or your care-partner.  YOUR PRESCRIPTION WAS SENT TO YOUR PHARMACY

## 2013-07-21 NOTE — Op Note (Signed)
[place]  [address]. [place], [id number]   ENDOSCOPY PROCEDURE REPORT  PATIENT: Crystal [name], Crystal [name]  MR#: [id number] BIRTHDATE: October 06, 1950 , 59  yrs. old GENDER: Female ENDOSCOPIST: Gatha Mayer, MD, [facility] PROCEDURE DATE:  07/21/2013 PROCEDURE:  EGD, diagnostic and Maloney dilation of esophagus ASA CLASS:     Class III INDICATIONS:  Dysphagia. MEDICATIONS: propofol (Diprivan) 100mg  IV, MAC sedation, administered by CRNA, and These medications were titrated to patient response per physician's verbal order TOPICAL ANESTHETIC: none  DESCRIPTION OF PROCEDURE: After the risks benefits and alternatives of the procedure were thoroughly explained, informed consent was obtained.  The LB [id number] endoscope was introduced through the mouth and advanced to the second portion of the duodenum. Without limitations.  The instrument was slowly withdrawn as the mucosa was fully examined.        ESOPHAGUS: Erosion was found at the gastroesophageal junction. Small about 1 cm max.  The remainder of the upper endoscopy exam was otherwise normal. Retroflexed views revealed no abnormalities.     The scope was then withdrawn from the patient, a 90 Fr Maloney dilator passed to treat dysphagia, and the procedure completed.  COMPLICATIONS: There were no complications. ENDOSCOPIC IMPRESSION: 1.   Erosion was found in the at the gastroesophageal junction - c/w reflux esophagitis/GERD 2.   The remainder of the upper endoscopy exam was otherwise normal - 54 Fr Maloney dilator passed to treat dysphagia  RECOMMENDATIONS: 1.  Clear liquids until 5 PM, then soft foods rest of day.  Resume prior diet tomorrow. 2.  PPI qam - stop ranitidine and start pantoprazole 40 mg every AM, reflux diet instructions 3.  return to Dr. Carlean Purl if fail to improve   eSigned:  Gatha Mayer, MD, [facility] 07/21/2013 4:02 PM   CC:O'Sullivan, Lenna Sciara FNP and The Patient

## 2013-07-22 ENCOUNTER — Telehealth: Payer: Self-pay | Admitting: *Deleted

## 2013-07-22 NOTE — Telephone Encounter (Signed)
  Follow up Call-  Call back number 07/21/2013 08/25/2011  Post procedure Call Back phone  # [phone]  Phone comments - may reach at [phone]  Permission to leave phone message Yes Yes     Patient questions:  Do you have a fever, pain , or abdominal swelling? no Pain Score  0 *  Have you tolerated food without any problems? yes  Have you been able to return to your normal activities? yes  Do you have any questions about your discharge instructions: Diet   no Medications  no Follow up visit  no  Do you have questions or concerns about your Care? no  Actions: * If pain score is 4 or above: No action needed, pain <4.

## 2013-08-09 ENCOUNTER — Other Ambulatory Visit: Payer: Self-pay | Admitting: Family

## 2013-08-09 ENCOUNTER — Telehealth: Payer: Self-pay | Admitting: Family

## 2013-08-09 NOTE — Telephone Encounter (Signed)
Needs new rx to [facility] for aviva test strips

## 2013-08-09 NOTE — Telephone Encounter (Signed)
Refill sent to pharmacy, see refill note. Pt aware.

## 2013-08-15 ENCOUNTER — Encounter: Payer: Self-pay | Admitting: Family

## 2013-08-15 ENCOUNTER — Ambulatory Visit (INDEPENDENT_AMBULATORY_CARE_PROVIDER_SITE_OTHER): Payer: 59 | Admitting: Family

## 2013-08-15 VITALS — BP 120/70 | HR 84 | Temp 98.0°F | Ht 63.5 in | Wt 157.0 lb

## 2013-08-15 DIAGNOSIS — E785 Hyperlipidemia, unspecified: Secondary | ICD-10-CM

## 2013-08-15 DIAGNOSIS — E1149 Type 2 diabetes mellitus with other diabetic neurological complication: Secondary | ICD-10-CM

## 2013-08-15 DIAGNOSIS — E119 Type 2 diabetes mellitus without complications: Secondary | ICD-10-CM

## 2013-08-15 LAB — HEMOGLOBIN A1C
Hgb A1c MFr Bld: 7.3 % — ABNORMAL HIGH (ref <5.7)
MEAN PLASMA GLUCOSE: 163 mg/dL — AB (ref <117)

## 2013-08-15 NOTE — Progress Notes (Signed)
Pre visit review using our clinic review tool, if applicable. No additional management support is needed unless otherwise documented below in the visit note. 

## 2013-08-15 NOTE — Progress Notes (Signed)
Subjective:    Patient ID: Crystal [name], female    DOB: 1950-09-10, 63 y.o.   MRN: [id number]  Hypertension Pertinent negatives include no chest pain, headaches or shortness of breath.  Hyperlipidemia Pertinent negatives include no chest pain, myalgias or shortness of breath.  Diabetes Pertinent negatives for hypoglycemia include no headaches. Pertinent negatives for diabetes include no chest pain, no fatigue and no weakness.   Crystal [name] is a 63 year old female who presents today for follow up.  Hypertension) Patient reports compliance to medications and is not currently checking at home.  BP Readings from Last 3 Encounters:  08/15/13 120/70  07/21/13 144/81  07/19/13 96/70    Diabetes) Patient reports she checks her blood sugars three times daily and are running from 95 to 140. Patient taking Januiva and reports compliance. Patient has recently joined the [employer] and is starting to work out.  Patient reports minimal fresh fruits and vegetables with regular fast food intake.  Hyperlipidemia) Patient reports compliance to medications, denies myalgias.    Review of Systems  Constitutional: Negative for fatigue.  HENT: Negative for rhinorrhea.   Respiratory: Negative for cough and shortness of breath.   Cardiovascular: Negative for chest pain and leg swelling.  Gastrointestinal: Negative for nausea.  Musculoskeletal: Negative for myalgias.  Neurological: Negative for weakness and headaches.   Past Medical History  Diagnosis Date  . Hypertension   . Hyperlipemia   . Dyspepsia   . OSA (obstructive sleep apnea)   . Plantar fasciitis   . Depression   . Asthma     02/08/10 FEV1 1.98 (80%), s/p saba 2.22 l/m (90%).nml  . Anxiety   . 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 Fatty liver 12/19/2010  . Internal hemorrhoids   . Diverticulosis   . Allergy     dust, cock roach,grass,weeds,molds  . GERD (gastroesophageal reflux disease)   . Acute peptic ulcer of  stomach     As a teenager  . IBS (irritable bowel syndrome)   . Cataract     History   Social History  . Marital Status: Married    Spouse Name: Crystal [name]    Number of Children: 4  . 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 Smoking status: Never Smoker   . Smokeless tobacco: Never Used  . Alcohol Use: No  . Drug Use: No  . Sexual Activity: Not on file   Other Topics Concern  . Not on file   Social History Narrative   No caffeine drinks daily     Past Surgical History  Procedure Laterality Date  . Knee arthroscopy Left 1998  . Toe surgery Right 1995  . Nasal septum surgery    . Rhinoplasty    . Appendectomy      age 38  . Colonoscopy  06/03/2010, 08/25/2011    diverticulosis   . Gastrectomy      partial - ulcer as a teen    Family History  Problem Relation Age of Onset  . Diabetes Mother     type II  . Heart attack Mother 4  . Ulcers      bleeding gastric  . Colon cancer Sister   . Heart defect      child  . Other      perforated bowel, child    Allergies  Allergen Reactions  . Compazine [Prochlorperazine Edisylate]   .  Prochlorperazine Edisylate Other (See Comments)    coma for 3 days  . Tetanus Toxoid Other (See Comments)    convulsions    Current Outpatient Prescriptions on File Prior to Visit  Medication Sig Dispense Refill  . ACCU-CHEK AVIVA PLUS test strip TEST BLOOD SUGARS 1 TO 2 TIMES A DAY  50 each  3  . albuterol (PROAIR HFA) 108 (90 BASE) MCG/ACT inhaler Inhale 2 puffs into the lungs every 4 (four) hours as needed. Shortness of breath and wheezing  18 g  2  . aspirin 81 MG tablet Take 81 mg by mouth daily.      Marland Kitchen atorvastatin (LIPITOR) 40 MG tablet Take 1 tablet (40 mg total) by mouth daily.  30 tablet  2  . Blood Glucose Monitoring Suppl (ACCU-CHEK AVIVA PLUS) W/DEVICE KIT 1 kit by Does not apply route daily. DX 250.00  1 kit  0  . Canagliflozin (INVOKANA) 100 MG TABS Take 1  tablet (100 mg total) by mouth daily.  30 tablet  3  . cefdinir (OMNICEF) 300 MG capsule Take 1 capsule (300 mg total) by mouth 2 (two) times daily.  20 capsule  0  . cetirizine (ZYRTEC) 10 MG tablet Take 10 mg by mouth daily.      . clotrimazole (LOTRIMIN) 1 % cream Apply to affected area 2 times daily x 2 weeks  15 g  0  . fluticasone (FLONASE) 50 MCG/ACT nasal spray Place 2 sprays into the nose daily.  16 g  3  . JANUVIA 100 MG tablet TAKE 1 TABLET BY MOUTH ONCE DAILY  30 tablet  3  . Lancets (ACCU-CHEK MULTICLIX) lancets Use to check blood sugar once daily as instructed.  DX 250.00  100 each  2  . lisinopril (PRINIVIL,ZESTRIL) 2.5 MG tablet TAKE 1 TABLET BY MOUTH ONCE DAILY  30 tablet  2  . LOTRONEX 1 MG tablet Take 1 mg by mouth daily.       . mometasone (NASONEX) 50 MCG/ACT nasal spray Place 2 sprays into the nose daily.  17 g  6  . Multiple Vitamins-Minerals (CENTRUM SILVER PO) Take 1 tablet by mouth daily.        . ondansetron (ZOFRAN) 4 MG tablet Take 1 tablet (4 mg total) by mouth every 8 (eight) hours as needed for nausea.  60 tablet  1  . pantoprazole (PROTONIX) 40 MG tablet Take 1 tablet (40 mg total) by mouth daily before breakfast.  30 tablet  11  . Probiotic Product (ALIGN) 4 MG CAPS Take 1 capsule by mouth daily.        . sertraline (ZOLOFT) 50 MG tablet TAKE 1 TABLET BY MOUTH ONCE DAILY  30 tablet  3  . simvastatin (ZOCOR) 40 MG tablet       . valACYclovir (VALTREX) 1000 MG tablet Take 1 tablet (1,000 mg total) by mouth daily.  30 tablet  5   No current facility-administered medications on file prior to visit.    BP 120/70  Pulse 84  Temp(Src) 98 F (36.7 C) (Oral)  Ht 5' 3.5" (1.613 m)  Wt 157 lb (71.215 kg)  BMI 27.37 kg/m2  SpO2 97%       Objective:   Physical Exam  Constitutional: She is oriented to person, place, and time. She appears well-nourished.  HENT:  Head: Normocephalic.  Cardiovascular: Normal rate, regular rhythm and normal heart sounds.     Pulmonary/Chest: Effort normal and breath sounds normal. No respiratory distress. She has no  wheezes.  Musculoskeletal: She exhibits no edema.  Neurological: She is alert and oriented to person, place, and time.  Skin: Skin is warm and dry.  Psychiatric: She has a normal mood and affect.          Assessment & Plan:  I have personally seen and examined patient and agree with Alma Friendly NP student's assessment and plan.

## 2013-08-15 NOTE — Assessment & Plan Note (Addendum)
Stable. Continue Januvia and Invokana. Will obtain A1C and BMET today.

## 2013-08-15 NOTE — Assessment & Plan Note (Signed)
Stable. Continue medications.  Obtain BMET today.

## 2013-08-15 NOTE — Assessment & Plan Note (Signed)
Stable. Denies myalgias. Continue statin medications. Will repeat lipid panel and LFT's today.

## 2013-08-15 NOTE — Patient Instructions (Signed)
Please complete lab work prior to leaving. Follow up in 3 months.  

## 2013-08-16 ENCOUNTER — Other Ambulatory Visit: Payer: Self-pay

## 2013-08-16 ENCOUNTER — Telehealth: Payer: Self-pay

## 2013-08-16 LAB — BASIC METABOLIC PANEL WITHOUT GFR
BUN: 21 mg/dL (ref 6–23)
CO2: 23 meq/L (ref 19–32)
Calcium: 8.9 mg/dL (ref 8.4–10.5)
Chloride: 105 meq/L (ref 96–112)
Creat: 0.87 mg/dL (ref 0.50–1.10)
GFR, Est African American: 81 mL/min
GFR, Est Non African American: 71 mL/min
Glucose, Bld: 132 mg/dL — ABNORMAL HIGH (ref 70–99)
Potassium: 4.4 meq/L (ref 3.5–5.3)
Sodium: 139 meq/L (ref 135–145)

## 2013-08-16 LAB — HEPATIC FUNCTION PANEL
ALT: 25 U/L (ref 0–35)
AST: 16 U/L (ref 0–37)
Albumin: 4 g/dL (ref 3.5–5.2)
Alkaline Phosphatase: 69 U/L (ref 39–117)
Bilirubin, Direct: 0.1 mg/dL (ref 0.0–0.3)
Indirect Bilirubin: 0.2 mg/dL (ref 0.2–1.2)
Total Bilirubin: 0.3 mg/dL (ref 0.2–1.2)
Total Protein: 6.6 g/dL (ref 6.0–8.3)

## 2013-08-16 LAB — LIPID PANEL
Cholesterol: 178 mg/dL (ref 0–200)
HDL: 51 mg/dL
LDL Cholesterol: 48 mg/dL (ref 0–99)
Total CHOL/HDL Ratio: 3.5 ratio
Triglycerides: 395 mg/dL — ABNORMAL HIGH
VLDL: 79 mg/dL — ABNORMAL HIGH (ref 0–40)

## 2013-08-16 MED ORDER — ALOSETRON HCL 1 MG PO TABS: 1.0000 mg | ORAL_TABLET | Freq: Two times a day (BID) | ORAL | Status: DC

## 2013-08-16 NOTE — Telephone Encounter (Signed)
Left message for patient to call me back to let me know if she wants to come pick up Lotronex rx or have it mailed to her.

## 2013-08-16 NOTE — Telephone Encounter (Signed)
Message copied by [place], Peggi Yono E on Tue Aug 16, 2013  5:27 PM ------      Message from: Gatha Mayer      Created: Tue Aug 16, 2013  4:39 PM       1 mg bid and as directed # 60 5 refills            She is using 1 mg every other day      ----- Message -----         From: Taressa Rauh E [place], CMA         Sent: 08/16/2013   1:16 PM           To: Gatha Mayer, MD            Need Lotronex rx, in your last note it said continue. Please tell me the sig you request Sir, thank you.         ------

## 2013-08-17 NOTE — Progress Notes (Signed)
Patient ID: Crystal [name], female   DOB: 1950/10/24, 63 y.o.   MRN: [id number] Mailed patients printed signed/sticker rx for Lotronex to her home per her request, address confirmed.

## 2013-08-18 ENCOUNTER — Telehealth: Payer: Self-pay | Admitting: Family

## 2013-08-18 ENCOUNTER — Telehealth: Payer: Self-pay

## 2013-08-18 DIAGNOSIS — E785 Hyperlipidemia, unspecified: Secondary | ICD-10-CM

## 2013-08-18 MED ORDER — FENOFIBRATE 145 MG PO TABS: 145.0000 mg | ORAL_TABLET | Freq: Every day | ORAL | Status: DC

## 2013-08-18 MED ORDER — METFORMIN HCL ER 500 MG PO TB24: 1000.0000 mg | ORAL_TABLET | Freq: Every day | ORAL | Status: DC

## 2013-08-18 NOTE — Telephone Encounter (Signed)
Please call pt and let her know sugar is above goal.  I would like her to try extended release glucophage- I am hopeful that she will be better able to tolerate the extended release version.

## 2013-08-18 NOTE — Telephone Encounter (Signed)
Relevant patient education assigned to patient using Emmi. ° °

## 2013-08-18 NOTE — Telephone Encounter (Signed)
Also, triglycerides are high.  Continue lipitor, add tricor.  Plan to repeat flp/lft in 6 weeks.  Dx hyperlipidemia.

## 2013-08-19 MED ORDER — FENOFIBRATE 160 MG PO TABS: 160.0000 mg | ORAL_TABLET | Freq: Every day | ORAL | Status: DC

## 2013-08-19 NOTE — Telephone Encounter (Signed)
Received call pharmacy that pt's insurance will only cover 54mg  or 160mg  of fenofibrate. Per verbal from Provider, ok to give 160mg . Rx sent.

## 2013-08-19 NOTE — Addendum Note (Signed)
Addended by: Kelle Darting A on: 08/19/2013 02:56 PM   Modules accepted: Orders

## 2013-08-19 NOTE — Telephone Encounter (Signed)
NOified pt and she voices understanding. Orders entered and copy mailed to pt as reminder.

## 2013-08-19 NOTE — Addendum Note (Signed)
Addended by: Kelle Darting A on: 08/19/2013 02:43 PM   Modules accepted: Orders

## 2013-08-23 ENCOUNTER — Telehealth: Payer: Self-pay

## 2013-08-23 NOTE — Telephone Encounter (Signed)
Lotronex has been approved thru 11/15/2013.  Approval form sent to be scanned in.  Ben at [facility] informed that this has been approved and will get it ready for patient.  Left voice mail for patient that rx approved.

## 2013-08-29 ENCOUNTER — Encounter: Payer: 59 | Admitting: Family

## 2013-08-31 ENCOUNTER — Encounter: Payer: Self-pay | Admitting: Physician Assistant

## 2013-08-31 ENCOUNTER — Ambulatory Visit (INDEPENDENT_AMBULATORY_CARE_PROVIDER_SITE_OTHER): Payer: 59 | Admitting: Physician Assistant

## 2013-08-31 VITALS — BP 124/84 | HR 79 | Temp 97.8°F | Resp 14 | Ht 63.5 in | Wt 158.5 lb

## 2013-08-31 DIAGNOSIS — R21 Rash and other nonspecific skin eruption: Secondary | ICD-10-CM

## 2013-08-31 MED ORDER — CLOTRIMAZOLE-BETAMETHASONE 1-0.05 % EX CREA: 1.0000 "application " | TOPICAL_CREAM | Freq: Two times a day (BID) | CUTANEOUS | Status: DC

## 2013-08-31 NOTE — Progress Notes (Signed)
Pre visit review using our clinic review tool, if applicable. No additional management support is needed unless otherwise documented below in the visit note/SLS  

## 2013-08-31 NOTE — Patient Instructions (Signed)
Please apply cream to area as directed.  Keep area clean and dry.  Apply cream for a few days after rash clears.  Please call if symptoms are worsening.

## 2013-09-01 DIAGNOSIS — L989 Disorder of the skin and subcutaneous tissue, unspecified: Secondary | ICD-10-CM | POA: Insufficient documentation

## 2013-09-01 NOTE — Progress Notes (Signed)
Patient presents to clinic today c/o rash of her right thigh that has been present for around 5 days.  Patient states she had a mole/bump on her right thigh that was "annoying" to her. She "did not like how it looked". Patient states she started picking at her mole that caused it to bleed. Patient states she put a Band-Aid over the area of concern. States she's noticed since applying Band-Aids she has a red, bumpy rash where the Band-Aid adhesive was. Patient denies pain, paroxysmal drainage from rash. Patient does have a history of type 2 diabetes. Denies history of similar rash. Patient has become lotion on the rash. Rash is unchanged, but is not going away. Patient denies rash elsewhere. Patient denies fever, chills malaise or fatigue.   Past Medical History  Diagnosis Date  . Hypertension   . Hyperlipemia   . Dyspepsia   . OSA (obstructive sleep apnea)   . Plantar fasciitis   . Depression   . Asthma     02/08/10 FEV1 1.98 (80%), s/p saba 2.22 l/m (90%).nml  . Anxiety   . 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 Fatty liver 12/19/2010  . Internal hemorrhoids   . Diverticulosis   . Allergy     dust, cock roach,grass,weeds,molds  . GERD (gastroesophageal reflux disease)   . Acute peptic ulcer of stomach     As a teenager  . IBS (irritable bowel syndrome)   . Cataract     Current Outpatient Prescriptions on File Prior to Visit  Medication Sig Dispense Refill  . ACCU-CHEK AVIVA PLUS test strip TEST BLOOD SUGARS 1 TO 2 TIMES A DAY  50 each  3  . albuterol (PROAIR HFA) 108 (90 BASE) MCG/ACT inhaler Inhale 2 puffs into the lungs every 4 (four) hours as needed. Shortness of breath and wheezing  18 g  2  . alosetron (LOTRONEX) 1 MG tablet Take 1 tablet (1 mg total) by mouth 2 (two) times daily. And as directed  60 tablet  5  . aspirin 81 MG tablet Take 81 mg by mouth daily.      Marland Kitchen atorvastatin (LIPITOR) 40 MG tablet Take 1 tablet (40 mg total) by mouth daily.  30 tablet  2  . Blood Glucose  Monitoring Suppl (ACCU-CHEK AVIVA PLUS) W/DEVICE KIT 1 kit by Does not apply route daily. DX 250.00  1 kit  0  . Canagliflozin (INVOKANA) 100 MG TABS Take 1 tablet (100 mg total) by mouth daily.  30 tablet  3  . cefdinir (OMNICEF) 300 MG capsule Take 1 capsule (300 mg total) by mouth 2 (two) times daily.  20 capsule  0  . cetirizine (ZYRTEC) 10 MG tablet Take 10 mg by mouth daily.      . clotrimazole (LOTRIMIN) 1 % cream Apply to affected area 2 times daily x 2 weeks  15 g  0  . fenofibrate 160 MG tablet Take 1 tablet (160 mg total) by mouth daily.  30 tablet  2  . fluticasone (FLONASE) 50 MCG/ACT nasal spray Place 2 sprays into the nose daily.  16 g  3  . JANUVIA 100 MG tablet TAKE 1 TABLET BY MOUTH ONCE DAILY  30 tablet  3  . Lancets (ACCU-CHEK MULTICLIX) lancets Use to check blood sugar once daily as instructed.  DX 250.00  100 each  2  . lisinopril (PRINIVIL,ZESTRIL) 2.5 MG tablet TAKE 1 TABLET BY MOUTH ONCE DAILY  30 tablet  2  . metFORMIN (GLUCOPHAGE  XR) 500 MG 24 hr tablet Take 2 tablets (1,000 mg total) by mouth daily with breakfast.  60 tablet  3  . mometasone (NASONEX) 50 MCG/ACT nasal spray Place 2 sprays into the nose daily.  17 g  6  . Multiple Vitamins-Minerals (CENTRUM SILVER PO) Take 1 tablet by mouth daily.        . ondansetron (ZOFRAN) 4 MG tablet Take 1 tablet (4 mg total) by mouth every 8 (eight) hours as needed for nausea.  60 tablet  1  . pantoprazole (PROTONIX) 40 MG tablet Take 1 tablet (40 mg total) by mouth daily before breakfast.  30 tablet  11  . Probiotic Product (ALIGN) 4 MG CAPS Take 1 capsule by mouth daily.        . sertraline (ZOLOFT) 50 MG tablet TAKE 1 TABLET BY MOUTH ONCE DAILY  30 tablet  3  . valACYclovir (VALTREX) 1000 MG tablet Take 1 tablet (1,000 mg total) by mouth daily.  30 tablet  5   No current facility-administered medications on file prior to visit.    Allergies  Allergen Reactions  . Compazine [Prochlorperazine Edisylate]   . Prochlorperazine  Edisylate Other (See Comments)    coma for 3 days  . Tetanus Toxoid Other (See Comments)    convulsions    Family History  Problem Relation Age of Onset  . Diabetes Mother     type II  . Heart attack Mother 8  . Ulcers      bleeding gastric  . Colon cancer Sister   . Heart defect      child  . Other      perforated bowel, child    History   Social History  . Marital Status: Married    Spouse Name: [name]    Number of Children: 4  . 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 Smoking status: Never Smoker   . Smokeless tobacco: Never Used  . Alcohol Use: No  . Drug Use: No  . Sexual Activity: None   Other Topics Concern  . None   Social History Narrative   No caffeine drinks daily    Review of Systems - See HPI.  All other ROS are negative.  BP 124/84  Pulse 79  Temp(Src) 97.8 F (36.6 C) (Oral)  Resp 14  Ht 5' 3.5" (1.613 m)  Wt 158 lb 8 oz (71.895 kg)  BMI 27.63 kg/m2  SpO2 96%  Physical Exam  Vitals reviewed. Constitutional: She is oriented to person, place, and time and well-developed, well-nourished, and in no distress.  HENT:  Head: Normocephalic and atraumatic.  Eyes: Conjunctivae are normal.  Neck: Neck supple.  Cardiovascular: Normal rate, regular rhythm and normal heart sounds.   Lymphadenopathy:    She has no cervical adenopathy.  Neurological: She is alert and oriented to person, place, and time.  Skin: Skin is warm and dry.  Presence of a red macular rash of right side, in the distribution of where her band aid was. No evidence of superimposed infection. No rash noted elsewhere.  Psychiatric: Affect normal.    Recent Results (from the past 2160 hour(s))  BASIC METABOLIC PANEL WITH GFR     Status: Abnormal   Collection Time    08/15/13  4:36 PM      Result Value Ref Range   Sodium 139  135 - 145 mEq/L   Potassium 4.4  3.5 -  5.3 mEq/L   Chloride 105  96 - 112 mEq/L   CO2 23   19 - 32 mEq/L   Glucose, Bld 132 (*) 70 - 99 mg/dL   BUN 21  6 - 23 mg/dL   Creat 0.87  0.50 - 1.10 mg/dL   Calcium 8.9  8.4 - 10.5 mg/dL   GFR, Est African American 81     GFR, Est Non African American 71     Comment:       The estimated GFR is a calculation valid for adults (>=73 years old)     that uses the CKD-EPI algorithm to adjust for age and sex. It is       not to be used for children, pregnant women, hospitalized patients,        patients on dialysis, or with rapidly changing kidney function.     According to the NKDEP, eGFR >89 is normal, 60-89 shows mild     impairment, 30-59 shows moderate impairment, 15-29 shows severe     impairment and <15 is ESRD.        HEPATIC FUNCTION PANEL     Status: None   Collection Time    08/15/13  4:36 PM      Result Value Ref Range   Total Bilirubin 0.3  0.2 - 1.2 mg/dL   Comment: ** Please note change in reference range(s). **   Bilirubin, Direct 0.1  0.0 - 0.3 mg/dL   Indirect Bilirubin 0.2  0.2 - 1.2 mg/dL   Comment: ** Please note change in reference range(s). **   Alkaline Phosphatase 69  39 - 117 U/L   AST 16  0 - 37 U/L   ALT 25  0 - 35 U/L   Total Protein 6.6  6.0 - 8.3 g/dL   Albumin 4.0  3.5 - 5.2 g/dL  LIPID PANEL     Status: Abnormal   Collection Time    08/15/13  4:36 PM      Result Value Ref Range   Cholesterol 178  0 - 200 mg/dL   Comment: ATP III Classification:           < 200        mg/dL        Desirable          200 - 239     mg/dL        Borderline High          >= 240        mg/dL        High         Triglycerides 395 (*) <150 mg/dL   HDL 51  >39 mg/dL   Total CHOL/HDL Ratio 3.5     VLDL 79 (*) 0 - 40 mg/dL   LDL Cholesterol 48  0 - 99 mg/dL   Comment:       Total Cholesterol/HDL Ratio:CHD Risk                            Coronary Heart Disease Risk Table                                            Men       Women              1/2  Average Risk              3.4        3.3                  Average Risk               5.0        4.4               2X Average Risk              9.6        7.1               3X Average Risk             23.4       11.0     Use the calculated Patient Ratio above and the CHD Risk table      to determine the patient's CHD Risk.     ATP III Classification (LDL):           < 100        mg/dL         Optimal          100 - 129     mg/dL         Near or Above Optimal          130 - 159     mg/dL         Borderline High          160 - 189     mg/dL         High           > 190        mg/dL         Very High        HEMOGLOBIN A1C     Status: Abnormal   Collection Time    08/15/13  4:36 PM      Result Value Ref Range   Hemoglobin A1C 7.3 (*) <5.7 %   Comment:                                                                            According to the ADA Clinical Practice Recommendations for 2011, when     HbA1c is used as a screening test:             >=6.5%   Diagnostic of Diabetes Mellitus                (if abnormal result is confirmed)           5.7-6.4%   Increased risk of developing Diabetes Mellitus           References:Diagnosis and Classification of Diabetes Mellitus,Diabetes     NIDP,8242,35(TIRWE 1):S62-S69 and Standards of Medical Care in             Diabetes - 2011,Diabetes RXVQ,0086,76 (Suppl 1):S11-S61.         Mean Plasma Glucose 163 (*) <117 mg/dL    Assessment/Plan: Rash and nonspecific skin eruption Unclear etiology. Likely a reaction to Band-Aid adhesive. However rash almost looks slightly  fungal on examination. Patient advised to avoid Band-Aid dressing. Rx Lotrisone cream.  Followup if symptoms not improving.

## 2013-09-01 NOTE — Assessment & Plan Note (Signed)
Unclear etiology. Likely a reaction to Band-Aid adhesive. However rash almost looks slightly fungal on examination. Patient advised to avoid Band-Aid dressing. Rx Lotrisone cream.  Followup if symptoms not improving.

## 2013-09-05 ENCOUNTER — Other Ambulatory Visit: Payer: Self-pay | Admitting: Family

## 2013-09-05 ENCOUNTER — Encounter: Payer: Self-pay | Admitting: Family

## 2013-09-05 ENCOUNTER — Other Ambulatory Visit (HOSPITAL_COMMUNITY)
Admission: RE | Admit: 2013-09-05 | Discharge: 2013-09-05 | Disposition: A | Discharge status: Home / Self Care | Payer: 59 | Source: Ambulatory Visit | Attending: Family | Admitting: Family

## 2013-09-05 ENCOUNTER — Ambulatory Visit (INDEPENDENT_AMBULATORY_CARE_PROVIDER_SITE_OTHER): Payer: 59 | Admitting: Family

## 2013-09-05 VITALS — BP 116/80 | HR 73 | Temp 97.4°F | Resp 16 | Ht 63.5 in | Wt 157.1 lb

## 2013-09-05 DIAGNOSIS — Z0000 Encounter for general adult medical examination without abnormal findings: Secondary | ICD-10-CM

## 2013-09-05 DIAGNOSIS — M1710 Unilateral primary osteoarthritis, unspecified knee: Secondary | ICD-10-CM

## 2013-09-05 DIAGNOSIS — E1149 Type 2 diabetes mellitus with other diabetic neurological complication: Secondary | ICD-10-CM

## 2013-09-05 DIAGNOSIS — Z01419 Encounter for gynecological examination (general) (routine) without abnormal findings: Secondary | ICD-10-CM | POA: Insufficient documentation

## 2013-09-05 DIAGNOSIS — M179 Osteoarthritis of knee, unspecified: Secondary | ICD-10-CM

## 2013-09-05 DIAGNOSIS — L03032 Cellulitis of left toe: Secondary | ICD-10-CM

## 2013-09-05 DIAGNOSIS — L03039 Cellulitis of unspecified toe: Secondary | ICD-10-CM

## 2013-09-05 DIAGNOSIS — IMO0002 Reserved for concepts with insufficient information to code with codable children: Secondary | ICD-10-CM

## 2013-09-05 LAB — CBC WITH DIFFERENTIAL/PLATELET
Basophils Absolute: 0 10*3/uL (ref 0.0–0.1)
Basophils Relative: 0 % (ref 0–1)
EOS ABS: 0.1 10*3/uL (ref 0.0–0.7)
EOS PCT: 2 % (ref 0–5)
HCT: 41.4 % (ref 36.0–46.0)
HEMOGLOBIN: 14.2 g/dL (ref 12.0–15.0)
Lymphocytes Relative: 31 % (ref 12–46)
Lymphs Abs: 2 10*3/uL (ref 0.7–4.0)
MCH: 31.6 pg (ref 26.0–34.0)
MCHC: 34.3 g/dL (ref 30.0–36.0)
MCV: 92 fL (ref 78.0–100.0)
MONOS PCT: 8 % (ref 3–12)
Monocytes Absolute: 0.5 10*3/uL (ref 0.1–1.0)
Neutro Abs: 3.8 10*3/uL (ref 1.7–7.7)
Neutrophils Relative %: 59 % (ref 43–77)
Platelets: 242 10*3/uL (ref 150–400)
RBC: 4.5 MIL/uL (ref 3.87–5.11)
RDW: 14.2 % (ref 11.5–15.5)
WBC: 6.4 10*3/uL (ref 4.0–10.5)

## 2013-09-05 LAB — TSH: TSH: 4.532 u[IU]/mL — ABNORMAL HIGH (ref 0.350–4.500)

## 2013-09-05 MED ORDER — CEPHALEXIN 500 MG PO CAPS: 500.0000 mg | ORAL_CAPSULE | Freq: Two times a day (BID) | ORAL | Status: DC

## 2013-09-05 NOTE — Assessment & Plan Note (Addendum)
Started patient on Metformin XR and is having good GI tolerance.  Will obtain A1C in May of 2015. Discussed importance of diet and exercise.

## 2013-09-05 NOTE — Patient Instructions (Signed)
Complete lab work prior to leaving today. You'll be contacted about your bone density scan. Follow up in May 2015 for diabetes.

## 2013-09-05 NOTE — Progress Notes (Signed)
Pre visit review using our clinic review tool, if applicable. No additional management support is needed unless otherwise documented below in the visit note. 

## 2013-09-05 NOTE — Assessment & Plan Note (Addendum)
Stable exam. Discussed healthy diet and exercise. Patient to get DEXA scan, Pap performed today.   and Tdap, Follow up in three months for diabetes.

## 2013-09-05 NOTE — Progress Notes (Signed)
Subjective:    Patient ID: Crystal [name], female    DOB: 1950/09/28, 63 y.o.   MRN: [id number]  HPI Crystal [name] is a 63 year old female who presents today for complete physical.  Immunizations: Influenza 2014, Pneumonia 2014, unsure of Tetanus Diet: Patient reports minimal fruit and vegetables, reports occasional intake of fast food. Exercise: Patient denies regular exercise due to knee pain Colonoscopy: Patient had one in 2012. Dexa: Denies Pap Smear: 2012 Mammogram: Fall 2014  Diabetes) Patient was changed to Metformin XR due to diarrhea. Patient reports she's tolerating well and is checking her blood sugars daily.  Knee pain- has hx of OA and is requesting referral to ortho.    Toe infection- reports + swelling left great toe after "cutting the meat" with toe clipper.   Review of Systems  Constitutional: Negative for fever.  HENT: Negative for congestion and rhinorrhea.   Respiratory: Negative for cough and shortness of breath.   Cardiovascular: Negative for chest pain.  Gastrointestinal: Negative for abdominal pain and constipation.  Genitourinary: Negative for difficulty urinating.  Musculoskeletal: Positive for arthralgias. Negative for myalgias.       Patient reports left knee pain from chronic knee injury  Skin: Negative for rash.  Neurological: Negative for dizziness, weakness and headaches.  Psychiatric/Behavioral:       Denies depression and anxiety.   Past Medical History  Diagnosis Date  . Hypertension   . Hyperlipemia   . Dyspepsia   . OSA (obstructive sleep apnea)   . Plantar fasciitis   . Depression   . Asthma     02/08/10 FEV1 1.98 (80%), s/p saba 2.22 l/m (90%).nml  . Anxiety   . 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 Fatty liver 12/19/2010  . Internal hemorrhoids   . Diverticulosis   . Allergy     dust, cock roach,grass,weeds,molds  . GERD (gastroesophageal reflux disease)   . Acute peptic ulcer of stomach     As a teenager  . IBS  (irritable bowel syndrome)   . Cataract     History   Social History  . Marital Status: Married    Spouse Name: Crystal [name]    Number of Children: 4  . 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 Smoking status: Never Smoker   . Smokeless tobacco: Never Used  . Alcohol Use: No  . Drug Use: No  . Sexual Activity: Not on file   Other Topics Concern  . Not on file   Social History Narrative   No caffeine drinks daily     Past Surgical History  Procedure Laterality Date  . Knee arthroscopy Left 1998  . Toe surgery Right 1995  . Nasal septum surgery    . Rhinoplasty    . Appendectomy      age 89  . Colonoscopy  06/03/2010, 08/25/2011    diverticulosis   . Gastrectomy      partial - ulcer as a teen    Family History  Problem Relation Age of Onset  . Diabetes Mother     type II  . Heart attack Mother 14  . Ulcers      bleeding gastric  . Colon cancer Sister   . Heart defect      child  . Other      perforated bowel, child    Allergies  Allergen Reactions  . Compazine [Prochlorperazine Edisylate]   .  Prochlorperazine Edisylate Other (See Comments)    coma for 3 days  . Tetanus Toxoid Other (See Comments)    convulsions    Current Outpatient Prescriptions on File Prior to Visit  Medication Sig Dispense Refill  . ACCU-CHEK AVIVA PLUS test strip TEST BLOOD SUGARS 1 TO 2 TIMES A DAY  50 each  3  . albuterol (PROAIR HFA) 108 (90 BASE) MCG/ACT inhaler Inhale 2 puffs into the lungs every 4 (four) hours as needed. Shortness of breath and wheezing  18 g  2  . alosetron (LOTRONEX) 1 MG tablet Take 1 tablet (1 mg total) by mouth 2 (two) times daily. And as directed  60 tablet  5  . aspirin 81 MG tablet Take 81 mg by mouth daily.      . Blood Glucose Monitoring Suppl (ACCU-CHEK AVIVA PLUS) W/DEVICE KIT 1 kit by Does not apply route daily. DX 250.00  1 kit  0  . cefdinir (OMNICEF) 300 MG capsule Take 1 capsule (300 mg  total) by mouth 2 (two) times daily.  20 capsule  0  . cetirizine (ZYRTEC) 10 MG tablet Take 10 mg by mouth daily.      . clotrimazole (LOTRIMIN) 1 % cream Apply to affected area 2 times daily x 2 weeks  15 g  0  . clotrimazole-betamethasone (LOTRISONE) cream Apply 1 application topically 2 (two) times daily.  30 g  0  . fenofibrate 160 MG tablet Take 1 tablet (160 mg total) by mouth daily.  30 tablet  2  . fluticasone (FLONASE) 50 MCG/ACT nasal spray Place 2 sprays into the nose daily.  16 g  3  . JANUVIA 100 MG tablet TAKE 1 TABLET BY MOUTH ONCE DAILY  30 tablet  3  . Lancets (ACCU-CHEK MULTICLIX) lancets Use to check blood sugar once daily as instructed.  DX 250.00  100 each  2  . lisinopril (PRINIVIL,ZESTRIL) 2.5 MG tablet TAKE 1 TABLET BY MOUTH ONCE DAILY  30 tablet  2  . metFORMIN (GLUCOPHAGE XR) 500 MG 24 hr tablet Take 2 tablets (1,000 mg total) by mouth daily with breakfast.  60 tablet  3  . mometasone (NASONEX) 50 MCG/ACT nasal spray Place 2 sprays into the nose daily.  17 g  6  . Multiple Vitamins-Minerals (CENTRUM SILVER PO) Take 1 tablet by mouth daily.        . ondansetron (ZOFRAN) 4 MG tablet Take 1 tablet (4 mg total) by mouth every 8 (eight) hours as needed for nausea.  60 tablet  1  . pantoprazole (PROTONIX) 40 MG tablet Take 1 tablet (40 mg total) by mouth daily before breakfast.  30 tablet  11  . Probiotic Product (ALIGN) 4 MG CAPS Take 1 capsule by mouth daily.        . sertraline (ZOLOFT) 50 MG tablet TAKE 1 TABLET BY MOUTH ONCE DAILY  30 tablet  3  . valACYclovir (VALTREX) 1000 MG tablet Take 1 tablet (1,000 mg total) by mouth daily.  30 tablet  5   No current facility-administered medications on file prior to visit.    BP 116/80  Pulse 73  Temp(Src) 97.4 F (36.3 C) (Oral)  Resp 16  Ht 5' 3.5" (1.613 m)  Wt 157 lb 1.3 oz (71.251 kg)  BMI 27.39 kg/m2  SpO2 99%        Objective:   Physical Exam  Constitutional: She is oriented to person, place, and time. She  appears well-nourished.  HENT:  Head: Normocephalic.  Eyes: Conjunctivae and EOM are normal. Pupils are equal, round, and reactive to light.  Neck: Neck supple. No JVD present. No thyromegaly present.  Cardiovascular: Normal rate, regular rhythm and normal heart sounds.   Pulmonary/Chest: Effort normal and breath sounds normal. No respiratory distress.  Abdominal: Soft. Bowel sounds are normal. She exhibits no distension. There is no tenderness.  Genitourinary: Vagina normal and uterus normal. No vaginal discharge found.  Musculoskeletal: Normal range of motion.  Lymphadenopathy:    She has no cervical adenopathy.  Neurological: She is alert and oriented to person, place, and time.  Skin: Skin is warm and dry.  + erythema beneath left great toenail distal tip with mild associated local edema.   Psychiatric: She has a normal mood and affect.          Assessment & Plan:  I have personally seen and examined patient and agree with Alma Friendly NP-student's assessment and plan.

## 2013-09-06 DIAGNOSIS — L03032 Cellulitis of left toe: Secondary | ICD-10-CM | POA: Insufficient documentation

## 2013-09-06 DIAGNOSIS — M179 Osteoarthritis of knee, unspecified: Secondary | ICD-10-CM | POA: Insufficient documentation

## 2013-09-06 DIAGNOSIS — M1710 Unilateral primary osteoarthritis, unspecified knee: Secondary | ICD-10-CM | POA: Insufficient documentation

## 2013-09-06 LAB — URINALYSIS, ROUTINE W REFLEX MICROSCOPIC
BILIRUBIN URINE: NEGATIVE
Glucose, UA: >1000 mg/dL — AB
HGB URINE DIPSTICK: NEGATIVE
KETONES UR: NEGATIVE mg/dL
Leukocytes, UA: NEGATIVE
Nitrite: NEGATIVE
PH: 5.5 (ref 5.0–8.0)
Protein, ur: NEGATIVE mg/dL
Specific Gravity, Urine: >1.03 — ABNORMAL HIGH (ref 1.005–1.030)
Urobilinogen, UA: 0.2 mg/dL (ref 0.0–1.0)

## 2013-09-06 LAB — URINALYSIS, MICROSCOPIC ONLY
BACTERIA UA: NONE SEEN
CRYSTALS: NONE SEEN
Casts: NONE SEEN

## 2013-09-06 NOTE — Assessment & Plan Note (Signed)
Will refer to ortho at pt request.

## 2013-09-06 NOTE — Assessment & Plan Note (Signed)
Will rx with keflex.

## 2013-09-07 LAB — T3, FREE: T3, Free: 2.7 pg/mL (ref 2.3–4.2)

## 2013-09-07 LAB — T4, FREE: Free T4: 1.45 ng/dL (ref 0.80–1.80)

## 2013-09-08 ENCOUNTER — Telehealth: Payer: Self-pay | Admitting: Family

## 2013-09-08 ENCOUNTER — Encounter: Payer: Self-pay | Admitting: Family

## 2013-09-08 ENCOUNTER — Inpatient Hospital Stay: Admission: RE | Admit: 2013-09-08 | Payer: 59 | Source: Ambulatory Visit

## 2013-09-08 DIAGNOSIS — E039 Hypothyroidism, unspecified: Secondary | ICD-10-CM | POA: Insufficient documentation

## 2013-09-08 HISTORY — DX: Hypothyroidism, unspecified: E03.9

## 2013-09-08 NOTE — Telephone Encounter (Signed)
Please let pt know that her thyroid is borderline low. I don't think we need to start medication, but we should continue to keep an eye on her lab work every 3 months or so.

## 2013-09-09 ENCOUNTER — Encounter: Payer: Self-pay | Admitting: Family

## 2013-09-09 NOTE — Telephone Encounter (Signed)
Also, pap smear is normal.  

## 2013-09-09 NOTE — Telephone Encounter (Signed)
Left message on voicemail to return my call.  

## 2013-09-09 NOTE — Telephone Encounter (Signed)
Notified pt and she voices understanding. 

## 2013-09-12 ENCOUNTER — Telehealth: Payer: Self-pay | Admitting: Family

## 2013-09-12 ENCOUNTER — Other Ambulatory Visit: Payer: Self-pay | Admitting: Family

## 2013-09-12 NOTE — Telephone Encounter (Signed)
Pt calling in regards to Ortho referral, do not see a referral in epic

## 2013-09-12 NOTE — Addendum Note (Signed)
Addended by: Debbrah Alar on: 09/12/2013 10:54 AM   Modules accepted: Orders

## 2013-09-15 ENCOUNTER — Ambulatory Visit (INDEPENDENT_AMBULATORY_CARE_PROVIDER_SITE_OTHER)
Admission: RE | Admit: 2013-09-15 | Discharge: 2013-09-15 | Disposition: A | Discharge status: Home / Self Care | Payer: 59 | Source: Ambulatory Visit | Attending: Family | Admitting: Family

## 2013-09-15 DIAGNOSIS — Z0000 Encounter for general adult medical examination without abnormal findings: Secondary | ICD-10-CM

## 2013-09-19 ENCOUNTER — Encounter: Payer: Self-pay | Admitting: Family

## 2013-09-19 ENCOUNTER — Telehealth: Payer: Self-pay | Admitting: Family

## 2013-09-19 DIAGNOSIS — M8580 Other specified disorders of bone density and structure, unspecified site: Secondary | ICD-10-CM | POA: Insufficient documentation

## 2013-09-26 ENCOUNTER — Telehealth: Payer: Self-pay | Admitting: *Deleted

## 2013-09-26 NOTE — Telephone Encounter (Signed)
Received message from pt on 09/23/13 requesting results of her DEXA from 1 week ago.  It appears letter was mailed on 09/19/13. Left detailed message on pt's cell# re: result and to call if any questions.

## 2013-09-26 NOTE — Telephone Encounter (Signed)
Opened in error

## 2013-10-11 ENCOUNTER — Encounter: Payer: Self-pay | Admitting: Family Medicine

## 2013-10-11 ENCOUNTER — Ambulatory Visit (INDEPENDENT_AMBULATORY_CARE_PROVIDER_SITE_OTHER): Payer: 59 | Admitting: Family Medicine

## 2013-10-11 ENCOUNTER — Telehealth: Payer: Self-pay | Admitting: Family

## 2013-10-11 VITALS — BP 120/80 | HR 95 | Temp 97.4°F | Ht 63.5 in | Wt 149.0 lb

## 2013-10-11 DIAGNOSIS — E1149 Type 2 diabetes mellitus with other diabetic neurological complication: Secondary | ICD-10-CM

## 2013-10-11 DIAGNOSIS — K219 Gastro-esophageal reflux disease without esophagitis: Secondary | ICD-10-CM

## 2013-10-11 DIAGNOSIS — I10 Essential (primary) hypertension: Secondary | ICD-10-CM

## 2013-10-11 DIAGNOSIS — B009 Herpesviral infection, unspecified: Secondary | ICD-10-CM

## 2013-10-11 DIAGNOSIS — E119 Type 2 diabetes mellitus without complications: Secondary | ICD-10-CM

## 2013-10-11 DIAGNOSIS — A6000 Herpesviral infection of urogenital system, unspecified: Secondary | ICD-10-CM

## 2013-10-11 DIAGNOSIS — F341 Dysthymic disorder: Secondary | ICD-10-CM

## 2013-10-11 DIAGNOSIS — F419 Anxiety disorder, unspecified: Secondary | ICD-10-CM

## 2013-10-11 DIAGNOSIS — K589 Irritable bowel syndrome without diarrhea: Secondary | ICD-10-CM

## 2013-10-11 DIAGNOSIS — T7840XA Allergy, unspecified, initial encounter: Secondary | ICD-10-CM

## 2013-10-11 DIAGNOSIS — A609 Anogenital herpesviral infection, unspecified: Secondary | ICD-10-CM

## 2013-10-11 DIAGNOSIS — E785 Hyperlipidemia, unspecified: Secondary | ICD-10-CM

## 2013-10-11 DIAGNOSIS — F329 Major depressive disorder, single episode, unspecified: Secondary | ICD-10-CM

## 2013-10-11 MED ORDER — MOMETASONE FUROATE 50 MCG/ACT NA SUSP: 2.0000 | Freq: Every day | NASAL | Status: DC | PRN

## 2013-10-11 MED ORDER — ATORVASTATIN CALCIUM 80 MG PO TABS: 80.0000 mg | ORAL_TABLET | Freq: Every day | ORAL | Status: DC

## 2013-10-11 MED ORDER — ESCITALOPRAM OXALATE 10 MG PO TABS: 10.0000 mg | ORAL_TABLET | Freq: Every day | ORAL | Status: DC

## 2013-10-11 MED ORDER — VALACYCLOVIR HCL 500 MG PO TABS: 500.0000 mg | ORAL_TABLET | Freq: Every day | ORAL | Status: DC

## 2013-10-11 MED ORDER — PANTOPRAZOLE SODIUM 40 MG PO TBEC: 40.0000 mg | DELAYED_RELEASE_TABLET | Freq: Every day | ORAL | Status: DC

## 2013-10-11 NOTE — Telephone Encounter (Signed)
Patient would like to transfer to Dr. Charlett Blake from [place]. Already ok per Charlett Blake. Scheduled appointment with Dr. Charlett Blake and cancelled appointment with [facility]

## 2013-10-11 NOTE — Progress Notes (Signed)
Pre visit review using our clinic review tool, if applicable. No additional management support is needed unless otherwise documented below in the visit note. 

## 2013-10-11 NOTE — Patient Instructions (Addendum)
Drop the Pantoprazole to every other day and take the Multivitamin every other day Drop the Valtrex from 1000mg  to 500 mg Stop the Sertraline and start the Escitalopram/Lexapro instead Switch the Caltrate to Citracal once daily Increase Atrovastatin to 80 mg a day from 40 mg  Irritable Bowel Syndrome Irritable Bowel Syndrome (IBS) is caused by a disturbance of normal bowel function. Other terms used are spastic colon, mucous colitis, and irritable colon. It does not require surgery, nor does it lead to cancer. There is no cure for IBS. But with proper diet, stress reduction, and medication, you will find that your problems (symptoms) will gradually disappear or improve. IBS is a common digestive disorder. It usually appears in late adolescence or early adulthood. Women develop it twice as often as men. CAUSES  After food has been digested and absorbed in the small intestine, waste material is moved into the colon (large intestine). In the colon, water and salts are absorbed from the undigested products coming from the small intestine. The remaining residue, or fecal material, is held for elimination. Under normal circumstances, gentle, rhythmic contractions on the bowel walls push the fecal material along the colon towards the rectum. In IBS, however, these contractions are irregular and poorly coordinated. The fecal material is either retained too long, resulting in constipation, or expelled too soon, producing diarrhea. SYMPTOMS  The most common symptom of IBS is pain. It is typically in the lower left side of the belly (abdomen). But it may occur anywhere in the abdomen. It can be felt as heartburn, backache, or even as a dull pain in the arms or shoulders. The pain comes from excessive bowel-muscle spasms and from the buildup of gas and fecal material in the colon. This pain:  Can range from sharp belly (abdominal) cramps to a dull, continuous ache.  Usually worsens soon after eating.  Is  typically relieved by having a bowel movement or passing gas. Abdominal pain is usually accompanied by constipation. But it may also produce diarrhea. The diarrhea typically occurs right after a meal or upon arising in the morning. The stools are typically soft and watery. They are often flecked with secretions (mucus). Other symptoms of IBS include:  Bloating.  Loss of appetite.  Heartburn.  Feeling sick to your stomach (nausea).  Belching  Vomiting  Gas. IBS may also cause a number of symptoms that are unrelated to the digestive system:  Fatigue.  Headaches.  Anxiety  Shortness of breath  Difficulty in concentrating.  Dizziness. These symptoms tend to come and go. DIAGNOSIS  The symptoms of IBS closely mimic the symptoms of other, more serious digestive disorders. So your caregiver may wish to perform a variety of additional tests to exclude these disorders. He/she wants to be certain of learning what is wrong (diagnosis). The nature and purpose of each test will be explained to you. TREATMENT A number of medications are available to help correct bowel function and/or relieve bowel spasms and abdominal pain. Among the drugs available are:  Mild, non-irritating laxatives for severe constipation and to help restore normal bowel habits.  Specific anti-diarrheal medications to treat severe or prolonged diarrhea.  Anti-spasmodic agents to relieve intestinal cramps.  Your caregiver may also decide to treat you with a mild tranquilizer or sedative during unusually stressful periods in your life. The important thing to remember is that if any drug is prescribed for you, make sure that you take it exactly as directed. Make sure that your caregiver knows how well  it worked for you. HOME CARE INSTRUCTIONS   Avoid foods that are high in fat or oils. Some examples JSE:GBTDV cream, butter, frankfurters, sausage, and other fatty meats.  Avoid foods that have a laxative effect, such  as fruit, fruit juice, and dairy products.  Cut out carbonated drinks, chewing gum, and "gassy" foods, such as beans and cabbage. This may help relieve bloating and belching.  Bran taken with plenty of liquids may help relieve constipation.  Keep track of what foods seem to trigger your symptoms.  Avoid emotionally charged situations or circumstances that produce anxiety.  Start or continue exercising.  Get plenty of rest and sleep. MAKE SURE YOU:   Understand these instructions.  Will watch your condition.  Will get help right away if you are not doing well or get worse. Document Released: 06/30/2005 Document Revised: 09/22/2011 Document Reviewed: 02/18/2008 [facility] Patient Information 2014 [place].

## 2013-10-12 ENCOUNTER — Encounter: Payer: Self-pay | Admitting: Family Medicine

## 2013-10-12 NOTE — Assessment & Plan Note (Signed)
Well controlled on Valtrex at 1000 will try dropping to 500 mg dialy

## 2013-10-12 NOTE — Assessment & Plan Note (Signed)
Stop fenofibrate and incresae Lipitor

## 2013-10-12 NOTE — Assessment & Plan Note (Signed)
Will try protonix QOD. Avoid offending foods, start probiotics. Do not eat large meals in late evening and consider raising head of bed.

## 2013-10-12 NOTE — Assessment & Plan Note (Signed)
Well controlled, no changes to meds. Encouraged heart healthy diet such as the DASH diet and exercise as tolerated.  °

## 2013-10-12 NOTE — Assessment & Plan Note (Signed)
Will simplify meds and recheck A1C. Stop Metformin to see if diarrhea improves. hgba1c acceptable, minimize simple carbs. Increase exercise as tolerated.

## 2013-10-12 NOTE — Progress Notes (Signed)
Patient ID: Crystal [name], female   DOB: 02/09/1951, 63 y.o.   MRN: [id number] AVILA ALBRITTON [id number] 10/15/1950 10/12/2013      Progress Note-Follow Up  Subjective  Chief Complaint  Chief Complaint  Patient presents with  . Irritable Bowel Syndrome    HPI  Patient is a 63 year old female in today for routine medical care. Patient is in today to discuss persistent diarrhea. She has numerous loose stool each day and has been worse over the last week. She's feeling weak. She's had vomiting and nausea as well. Generalized abdominal pain is noted. No obvious fevers or chills. Does note poor appetite. No chest pain, palpitations or shortness of breath. She has been struggling with chronic diarrhea for a while but it has worsened this week.  Past Medical History  Diagnosis Date  . Hypertension   . Hyperlipemia   . Dyspepsia   . OSA (obstructive sleep apnea)   . Plantar fasciitis   . Depression   . Asthma     02/08/10 FEV1 1.98 (80%), s/p saba 2.22 l/m (90%).nml  . Anxiety   . 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 Fatty liver 12/19/2010  . Internal hemorrhoids   . Diverticulosis   . Allergy     dust, cock roach,grass,weeds,molds  . GERD (gastroesophageal reflux disease)   . Acute peptic ulcer of stomach     As a teenager  . IBS (irritable bowel syndrome)   . Cataract     Past Surgical History  Procedure Laterality Date  . Knee arthroscopy Left 1998  . Toe surgery Right 1995  . Nasal septum surgery    . Rhinoplasty    . Appendectomy      age 63  . Colonoscopy  06/03/2010, 08/25/2011    diverticulosis   . Gastrectomy      partial - ulcer as a teen    Family History  Problem Relation Age of Onset  . Diabetes Mother     type II  . Heart attack Mother 62  . Ulcers      bleeding gastric  . Colon cancer Sister   . Heart defect      child  . Other      perforated bowel, child    History   Social History  . Marital Status: Married    Spouse Name: [name]   Number of Children: 4  . 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 Smoking status: Never Smoker   . Smokeless tobacco: Never Used  . Alcohol Use: No  . Drug Use: No  . Sexual Activity: Not on file   Other Topics Concern  . Not on file   Social History Narrative   No caffeine drinks daily     Current Outpatient Prescriptions on File Prior to Visit  Medication Sig Dispense Refill  . ACCU-CHEK AVIVA PLUS test strip TEST BLOOD SUGARS 1 TO 2 TIMES A DAY  50 each  3  . albuterol (PROAIR HFA) 108 (90 BASE) MCG/ACT inhaler Inhale 2 puffs into the lungs every 4 (four) hours as needed. Shortness of breath and wheezing  18 g  2  . alosetron (LOTRONEX) 1 MG tablet Take 1 tablet (1 mg total) by mouth 2 (two) times daily. And as directed  60 tablet  5  . aspirin 81 MG tablet Take 81 mg by mouth daily.      Marland Kitchen  Blood Glucose Monitoring Suppl (ACCU-CHEK AVIVA PLUS) W/DEVICE KIT 1 kit by Does not apply route daily. DX 250.00  1 kit  0  . clotrimazole (LOTRIMIN) 1 % cream Apply to affected area 2 times daily x 2 weeks  15 g  0  . clotrimazole-betamethasone (LOTRISONE) cream Apply 1 application topically 2 (two) times daily.  30 g  0  . INVOKANA 100 MG TABS TAKE 1 TABLET (100 MG TOTAL) BY MOUTH DAILY.  30 tablet  3  . Lancets (ACCU-CHEK MULTICLIX) lancets Use to check blood sugar once daily as instructed.  DX 250.00  100 each  2  . lisinopril (PRINIVIL,ZESTRIL) 2.5 MG tablet TAKE 1 TABLET BY MOUTH ONCE DAILY  30 tablet  3  . Multiple Vitamins-Minerals (CENTRUM SILVER PO) Take 1 tablet by mouth daily.        . ondansetron (ZOFRAN) 4 MG tablet Take 1 tablet (4 mg total) by mouth every 8 (eight) hours as needed for nausea.  60 tablet  1  . Probiotic Product (ALIGN) 4 MG CAPS Take 1 capsule by mouth daily.         No current facility-administered medications on file prior to visit.    Allergies  Allergen Reactions  . Compazine [Prochlorperazine  Edisylate]   . Prochlorperazine Edisylate Other (See Comments)    coma for 3 days  . Tetanus Toxoid Other (See Comments)    convulsions    Review of Systems  Review of Systems  Constitutional: Positive for malaise/fatigue. Negative for fever.  HENT: Negative for congestion.   Eyes: Negative for discharge.  Respiratory: Negative for shortness of breath.   Cardiovascular: Negative for chest pain, palpitations and leg swelling.  Gastrointestinal: Positive for diarrhea. Negative for nausea, abdominal pain and melena.  Genitourinary: Negative for dysuria.  Musculoskeletal: Negative for falls.  Skin: Negative for rash.  Neurological: Negative for loss of consciousness and headaches.  Endo/Heme/Allergies: Negative for polydipsia.  Psychiatric/Behavioral: Negative for depression and suicidal ideas. The patient is not nervous/anxious and does not have insomnia.     Objective  BP 120/80  Pulse 95  Temp(Src) 97.4 F (36.3 C) (Oral)  Ht 5' 3.5" (1.613 m)  Wt 149 lb (67.586 kg)  BMI 25.98 kg/m2  SpO2 97%  Physical Exam  Physical Exam  Constitutional: She is oriented to person, place, and time and well-developed, well-nourished, and in no distress. No distress.  HENT:  Head: Normocephalic and atraumatic.  Eyes: Conjunctivae are normal.  Neck: Neck supple. No thyromegaly present.  Cardiovascular: Normal rate, regular rhythm and normal heart sounds.   No murmur heard. Pulmonary/Chest: Effort normal and breath sounds normal. She has no wheezes.  Abdominal: She exhibits no distension and no mass.  Musculoskeletal: She exhibits no edema.  Lymphadenopathy:    She has no cervical adenopathy.  Neurological: She is alert and oriented to person, place, and time.  Skin: Skin is warm and dry. No rash noted. She is not diaphoretic.  Psychiatric: Memory, affect and judgment normal.    Lab Results  Component Value Date   TSH 4.532* 09/05/2013   Lab Results  Component Value Date   WBC  6.4 09/05/2013   HGB 14.2 09/05/2013   HCT 41.4 09/05/2013   MCV 92.0 09/05/2013   PLT 242 09/05/2013   Lab Results  Component Value Date   CREATININE 0.87 08/15/2013   BUN 21 08/15/2013   NA 139 08/15/2013   K 4.4 08/15/2013   CL 105 08/15/2013   CO2 23 08/15/2013  Lab Results  Component Value Date   ALT 25 08/15/2013   AST 16 08/15/2013   ALKPHOS 69 08/15/2013   BILITOT 0.3 08/15/2013   Lab Results  Component Value Date   CHOL 178 08/15/2013   Lab Results  Component Value Date   HDL 51 08/15/2013   Lab Results  Component Value Date   LDLCALC 48 08/15/2013   Lab Results  Component Value Date   TRIG 395* 08/15/2013   Lab Results  Component Value Date   CHOLHDL 3.5 08/15/2013     Assessment & Plan  Irritable bowel syndrome - diarrhea predominant Switch probiotics, simplify meds and follow up with gastroenterology if no improvement  HYPERTENSION Well controlled, no changes to meds. Encouraged heart healthy diet such as the DASH diet and exercise as tolerated.   GERD (gastroesophageal reflux disease) Will try protonix QOD. Avoid offending foods, start probiotics. Do not eat large meals in late evening and consider raising head of bed.   GENITAL HERPES Well controlled on Valtrex at 1000 will try dropping to 500 mg dialy  DM (diabetes mellitus), type 2 with neurological complications Will simplify meds and recheck A1C. Stop Metformin to see if diarrhea improves. hgba1c acceptable, minimize simple carbs. Increase exercise as tolerated.   HYPERLIPIDEMIA Stop fenofibrate and incresae Lipitor

## 2013-10-12 NOTE — Assessment & Plan Note (Signed)
Switch probiotics, simplify meds and follow up with gastroenterology if no improvement

## 2013-10-24 ENCOUNTER — Telehealth: Payer: Self-pay

## 2013-10-24 NOTE — Telephone Encounter (Signed)
Relevant patient education assigned to patient using Emmi. ° °

## 2013-10-25 ENCOUNTER — Encounter: Payer: Self-pay | Admitting: Family Medicine

## 2013-10-25 ENCOUNTER — Ambulatory Visit (INDEPENDENT_AMBULATORY_CARE_PROVIDER_SITE_OTHER): Payer: 59 | Admitting: Family Medicine

## 2013-10-25 VITALS — BP 141/90 | HR 76 | Ht 64.0 in | Wt 149.0 lb

## 2013-10-25 DIAGNOSIS — J309 Allergic rhinitis, unspecified: Secondary | ICD-10-CM

## 2013-10-25 DIAGNOSIS — M19049 Primary osteoarthritis, unspecified hand: Secondary | ICD-10-CM

## 2013-10-25 NOTE — Patient Instructions (Signed)
We gave you a cortisone injection today on the right side. Aleve 1-2 tabs twice a day with food for pain and inflammation. Thumb spica braces as you had been as often as possible. Follow up with me as needed.

## 2013-10-26 DIAGNOSIS — J309 Allergic rhinitis, unspecified: Secondary | ICD-10-CM

## 2013-10-26 DIAGNOSIS — M19049 Primary osteoarthritis, unspecified hand: Secondary | ICD-10-CM

## 2013-10-26 MED ORDER — METHYLPREDNISOLONE ACETATE 40 MG/ML IJ SUSP
40.0000 mg | Freq: Once | INTRAMUSCULAR | Status: AC
  Administered 2013-10-26: 40 mg via INTRA_ARTICULAR

## 2013-10-31 ENCOUNTER — Other Ambulatory Visit: Payer: Self-pay | Admitting: *Deleted

## 2013-10-31 MED ORDER — NITROGLYCERIN 0.2 MG/HR TD PT24: MEDICATED_PATCH | TRANSDERMAL | Status: DC

## 2013-11-01 ENCOUNTER — Encounter: Payer: Self-pay | Admitting: Family Medicine

## 2013-11-01 NOTE — Progress Notes (Signed)
Patient ID: Crystal [name], female   DOB: 1951-06-03, 63 y.o.   MRN: [id number]  Subjective:    Patient ID: Crystal [name], female    DOB: 05/11/1951, 62 y.o.   MRN: [id number]  PCP: Debbrah Alar  Hand Pain    63 yo F here for f/u bilateral thumb pain.  4/3: Patient reports for about a year she has had pain at base of her thumbs. Has really intensified past few months, now very difficult to function. Pain radiates into thumb past MCP and into index finger. No numbness/tingling. Taking tylenol and using activeon. Works at a computer 8 hours a day, lots of typing. No prior treatment for this.  Not bracing.  5/6: Patient returns stating right thumb is much improved after injection last visit. Here for similar shot in left thumb as this one has worsened.  03/04/13: Patient returns as left and right thumb bases bothering her again. Has brace only for right but not currently using. Injections in both sides have lasted until past few weeks. Not icing or taking any medicines now.  10/25/13: Patient returns for right thumb pain primarily - would like repeat injection. Left side is doing ok. Has been using braces. Not taking any medicines regularly for pain. Continues with high dexterity job.  Past Medical History  Diagnosis Date  . Hypertension   . Hyperlipemia   . Dyspepsia   . OSA (obstructive sleep apnea)   . Plantar fasciitis   . Depression   . Asthma     02/08/10 FEV1 1.98 (80%), s/p saba 2.22 l/m (90%).nml  . Anxiety   . 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 Fatty liver 12/19/2010  . Internal hemorrhoids   . Diverticulosis   . Allergy     dust, cock roach,grass,weeds,molds  . GERD (gastroesophageal reflux disease)   . Acute peptic ulcer of stomach     As a teenager  . IBS (irritable bowel syndrome)   . Cataract     Current Outpatient Prescriptions on File Prior to Visit  Medication Sig Dispense Refill  . ACCU-CHEK AVIVA PLUS test strip TEST BLOOD  SUGARS 1 TO 2 TIMES A DAY  50 each  3  . albuterol (PROAIR HFA) 108 (90 BASE) MCG/ACT inhaler Inhale 2 puffs into the lungs every 4 (four) hours as needed. Shortness of breath and wheezing  18 g  2  . alosetron (LOTRONEX) 1 MG tablet Take 1 tablet (1 mg total) by mouth 2 (two) times daily. And as directed  60 tablet  5  . aspirin 81 MG tablet Take 81 mg by mouth daily.      Marland Kitchen atorvastatin (LIPITOR) 80 MG tablet Take 1 tablet (80 mg total) by mouth daily.  30 tablet  3  . Blood Glucose Monitoring Suppl (ACCU-CHEK AVIVA PLUS) W/DEVICE KIT 1 kit by Does not apply route daily. DX 250.00  1 kit  0  . escitalopram (LEXAPRO) 10 MG tablet Take 1 tablet (10 mg total) by mouth daily.  30 tablet  3  . INVOKANA 100 MG TABS TAKE 1 TABLET (100 MG TOTAL) BY MOUTH DAILY.  30 tablet  3  . Lancets (ACCU-CHEK MULTICLIX) lancets Use to check blood sugar once daily as instructed.  DX 250.00  100 each  2  . lisinopril (PRINIVIL,ZESTRIL) 2.5 MG tablet TAKE 1 TABLET BY MOUTH ONCE DAILY  30 tablet  3  . mometasone (NASONEX) 50 MCG/ACT nasal spray Place 2 sprays into the nose daily  as needed.  17 g  6  . Multiple Vitamins-Minerals (CENTRUM SILVER PO) Take 1 tablet by mouth daily.        . ondansetron (ZOFRAN) 4 MG tablet Take 1 tablet (4 mg total) by mouth every 8 (eight) hours as needed for nausea.  60 tablet  1  . pantoprazole (PROTONIX) 40 MG tablet Take 1 tablet (40 mg total) by mouth daily.  30 tablet  11  . Probiotic Product (ALIGN) 4 MG CAPS Take 1 capsule by mouth daily.        . valACYclovir (VALTREX) 500 MG tablet Take 1 tablet (500 mg total) by mouth daily.  30 tablet  5   No current facility-administered medications on file prior to visit.    Past Surgical History  Procedure Laterality Date  . Knee arthroscopy Left 1998  . Toe surgery Right 1995  . Nasal septum surgery    . Rhinoplasty    . Appendectomy      age 86  . Colonoscopy  06/03/2010, 08/25/2011    diverticulosis   . Gastrectomy      partial  - ulcer as a teen    Allergies  Allergen Reactions  . Compazine [Prochlorperazine Edisylate]   . Prochlorperazine Edisylate Other (See Comments)    coma for 3 days  . Tetanus Toxoid Other (See Comments)    convulsions    History   Social History  . Marital Status: Married    Spouse Name: [name]    Number of Children: 4  . 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 Smoking status: Never Smoker   . Smokeless tobacco: Never Used  . Alcohol Use: No  . Drug Use: No  . Sexual Activity: Not on file   Other Topics Concern  . Not on file   Social History Narrative   No caffeine drinks daily     Family History  Problem Relation Age of Onset  . Diabetes Mother     type II  . Heart attack Mother 44  . Ulcers      bleeding gastric  . Colon cancer Sister   . Heart defect      child  . Other      perforated bowel, child    BP 141/90  Pulse 76  Ht 5' 4" (1.626 m)  Wt 149 lb (67.586 kg)  BMI 25.56 kg/m2  Review of Systems See HPI above.    Objective:   Physical Exam Gen: NAD  L hand: No gross deformity, swelling, bruising. Minimal TTP 1st CMC joint.  No 1st dorsal compartment or carpal tunnel tenderness.  No other hand, wrist TTP. Decreased ROM all planes of 1st CMC.  FROM MCP and IP of thumb. Collateral ligaments intact. Negative finkelsteins. NVI distally.  R hand: No gross deformity, swelling, bruising. Mod TTP 1st CMC joint.  No 1st dorsal compartment or carpal tunnel tenderness.  No other hand, wrist TTP. Decreased ROM all planes of 1st CMC.  FROM MCP and IP of thumb. Collateral ligaments intact. Negative finkelsteins. NVI distally.    Assessment & Plan:  1. 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  Continue bracing.  Tylenol, nsaids, glucosamine, capsaicin as discussed previously.  F/u prn.  After informed written consent patient  was seated in chair in exam room.  Ultrasound used to identify joint space.  Alcohol swab  used for prep then right 1st CMC injected with 0.5:0.5 marcaine: depomedrol.  Patient tolerated procedure well without immediate complications.

## 2013-11-02 NOTE — Assessment & Plan Note (Signed)
Went ahead with right sided injection after using ultrasound to identify joint.  Continue bracing.  Tylenol, nsaids, glucosamine, capsaicin as discussed previously.  F/u prn.  After informed written consent patient was seated in chair in exam room.  Ultrasound used to identify joint space.  Alcohol swab used for prep then right 1st CMC injected with 0.5:0.5 marcaine: depomedrol.  Patient tolerated procedure well without immediate complications.

## 2013-11-02 NOTE — Assessment & Plan Note (Deleted)
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  Continue bracing.  Tylenol, nsaids, glucosamine, capsaicin as discussed previously.  F/u prn.  After informed written consent patient was seated in chair in exam room.  Ultrasound used to identify joint space.  Alcohol swab used for prep then right 1st CMC injected with 0.5:0.5 marcaine: depomedrol.  Patient tolerated procedure well without immediate complications.

## 2013-11-08 ENCOUNTER — Ambulatory Visit (INDEPENDENT_AMBULATORY_CARE_PROVIDER_SITE_OTHER): Payer: 59 | Admitting: Family Medicine

## 2013-11-08 ENCOUNTER — Encounter: Payer: Self-pay | Admitting: Family Medicine

## 2013-11-08 VITALS — BP 120/80 | HR 77 | Temp 98.0°F | Ht 63.5 in | Wt 150.0 lb

## 2013-11-08 DIAGNOSIS — K589 Irritable bowel syndrome without diarrhea: Secondary | ICD-10-CM

## 2013-11-08 DIAGNOSIS — E1149 Type 2 diabetes mellitus with other diabetic neurological complication: Secondary | ICD-10-CM

## 2013-11-08 DIAGNOSIS — I10 Essential (primary) hypertension: Secondary | ICD-10-CM

## 2013-11-08 DIAGNOSIS — E785 Hyperlipidemia, unspecified: Secondary | ICD-10-CM

## 2013-11-08 NOTE — Patient Instructions (Signed)
DASH Diet  The DASH diet stands for "Dietary Approaches to Stop Hypertension." It is a healthy eating plan that has been shown to reduce high blood pressure (hypertension) in as little as 14 days, while also possibly providing other significant health benefits. These other health benefits include reducing the risk of breast cancer after menopause and reducing the risk of type 2 diabetes, heart disease, colon cancer, and stroke. Health benefits also include weight loss and slowing kidney failure in patients with chronic kidney disease.   DIET GUIDELINES  · Limit salt (sodium). Your diet should contain less than 1500 mg of sodium daily.  · Limit refined or processed carbohydrates. Your diet should include mostly whole grains. Desserts and added sugars should be used sparingly.  · Include small amounts of heart-healthy fats. These types of fats include nuts, oils, and tub margarine. Limit saturated and trans fats. These fats have been shown to be harmful in the body.  CHOOSING FOODS   The following food groups are based on a 2000 calorie diet. See your Registered Dietitian for individual calorie needs.  Grains and Grain Products (6 to 8 servings daily)  · Eat More Often: Whole-wheat bread, brown rice, whole-grain or wheat pasta, quinoa, popcorn without added fat or salt (air popped).  · Eat Less Often: White bread, white pasta, white rice, cornbread.  Vegetables (4 to 5 servings daily)  · Eat More Often: Fresh, frozen, and canned vegetables. Vegetables may be raw, steamed, roasted, or grilled with a minimal amount of fat.  · Eat Less Often/Avoid: Creamed or fried vegetables. Vegetables in a cheese sauce.  Fruit (4 to 5 servings daily)  · Eat More Often: All fresh, canned (in natural juice), or frozen fruits. Dried fruits without added sugar. One hundred percent fruit juice (½ cup [237 mL] daily).  · Eat Less Often: Dried fruits with added sugar. Canned fruit in light or heavy syrup.  Lean Meats, Fish, and Poultry (2  servings or less daily. One serving is 3 to 4 oz [85-114 g]).  · Eat More Often: Ninety percent or leaner ground beef, tenderloin, sirloin. Round cuts of beef, chicken breast, turkey breast. All fish. Grill, bake, or broil your meat. Nothing should be fried.  · Eat Less Often/Avoid: Fatty cuts of meat, turkey, or chicken leg, thigh, or wing. Fried cuts of meat or fish.  Dairy (2 to 3 servings)  · Eat More Often: Low-fat or fat-free milk, low-fat plain or light yogurt, reduced-fat or part-skim cheese.  · Eat Less Often/Avoid: Milk (whole, 2%). Whole milk yogurt. Full-fat cheeses.  Nuts, Seeds, and Legumes (4 to 5 servings per week)  · Eat More Often: All without added salt.  · Eat Less Often/Avoid: Salted nuts and seeds, canned beans with added salt.  Fats and Sweets (limited)  · Eat More Often: Vegetable oils, tub margarines without trans fats, sugar-free gelatin. Mayonnaise and salad dressings.  · Eat Less Often/Avoid: Coconut oils, palm oils, butter, stick margarine, cream, half and half, cookies, candy, pie.  FOR MORE INFORMATION  The Dash Diet Eating Plan: www.dashdiet.org  Document Released: 06/19/2011 Document Revised: 09/22/2011 Document Reviewed: 06/19/2011  [employer]® Patient Information ©2014 [employer].

## 2013-11-08 NOTE — Progress Notes (Signed)
Pre visit review using our clinic review tool, if applicable. No additional management support is needed unless otherwise documented below in the visit note. 

## 2013-11-13 ENCOUNTER — Encounter: Payer: Self-pay | Admitting: Family Medicine

## 2013-11-13 NOTE — Assessment & Plan Note (Signed)
Avoid offending foods, start probiotics. Do not eat large meals in late evening and consider raising head of bed.  

## 2013-11-13 NOTE — Assessment & Plan Note (Signed)
Tolerating statin, encouraged heart healthy diet, avoid trans fats, minimize simple carbs and saturated fats. Increase exercise as tolerated 

## 2013-11-13 NOTE — Assessment & Plan Note (Signed)
Well controlled, no changes to meds. Encouraged heart healthy diet such as the DASH diet and exercise as tolerated.  °

## 2013-11-13 NOTE — Assessment & Plan Note (Deleted)
Avoid offending foods, start probiotics. Do not eat large meals in late evening and consider raising head of bed.  

## 2013-11-13 NOTE — Assessment & Plan Note (Signed)
hgba1c acceptable, minimize simple carbs. Increase exercise as tolerated. Continue current meds 

## 2013-11-13 NOTE — Progress Notes (Signed)
Patient ID: BREALYN [name], female   DOB: [birth year], 63 y.o.   MRN: [id number] Crystal [name] [id number] 07/12/1951 11/13/2013      Progress Note-Follow Up  Subjective  Chief Complaint  Chief Complaint  Patient presents with  . Follow-up    4 week    HPI  Patient is a 63 year old female in today for routine medical care. Is doing somewhat better. Loose stool had decreased roughly 2 a day. No bloody or tarry stool. No recent illness, fevers or chills. Blood sugars. Generally ranging from 85 to 135 she has been trying to maintain a heart healthy diet and has been walking slightly more. Denies CP/palp/SOB/HA/congestion/fevers/GI or GU c/o. Taking meds as prescribed  Past Medical History  Diagnosis Date  . Hypertension   . Hyperlipemia   . Dyspepsia   . OSA (obstructive sleep apnea)   . Plantar fasciitis   . Depression   . Asthma     02/08/10 FEV1 1.98 (80%), s/p saba 2.22 l/m (90%).nml  . Anxiety   . 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 Fatty liver 12/19/2010  . Internal hemorrhoids   . Diverticulosis   . Allergy     dust, cock roach,grass,weeds,molds  . GERD (gastroesophageal reflux disease)   . Acute peptic ulcer of stomach     As a teenager  . IBS (irritable bowel syndrome)   . Cataract     Past Surgical History  Procedure Laterality Date  . Knee arthroscopy Left 1998  . Toe surgery Right 1995  . Nasal septum surgery    . Rhinoplasty    . Appendectomy      age 38  . Colonoscopy  06/03/2010, 08/25/2011    diverticulosis   . Gastrectomy      partial - ulcer as a teen    Family History  Problem Relation Age of Onset  . Diabetes Mother     type II  . Heart attack Mother 39  . Ulcers      bleeding gastric  . Colon cancer Sister   . Heart defect      child  . Other      perforated bowel, child    History   Social History  . Marital Status: Married    Spouse Name: [name]    Number of Children: 4  . Years of Education: N/A   Occupational History   . 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 Smoking status: Never Smoker   . Smokeless tobacco: Never Used  . Alcohol Use: No  . Drug Use: No  . Sexual Activity: Not on file   Other Topics Concern  . Not on file   Social History Narrative   No caffeine drinks daily     Current Outpatient Prescriptions on File Prior to Visit  Medication Sig Dispense Refill  . ACCU-CHEK AVIVA PLUS test strip TEST BLOOD SUGARS 1 TO 2 TIMES A DAY  50 each  3  . albuterol (PROAIR HFA) 108 (90 BASE) MCG/ACT inhaler Inhale 2 puffs into the lungs every 4 (four) hours as needed. Shortness of breath and wheezing  18 g  2  . alosetron (LOTRONEX) 1 MG tablet Take 1 tablet (1 mg total) by mouth 2 (two) times daily. And as directed  60 tablet  5  . aspirin 81 MG tablet Take 81 mg by mouth daily.      Marland Kitchen atorvastatin (LIPITOR) 80 MG tablet Take 1 tablet (  80 mg total) by mouth daily.  30 tablet  3  . Blood Glucose Monitoring Suppl (ACCU-CHEK AVIVA PLUS) W/DEVICE KIT 1 kit by Does not apply route daily. DX 250.00  1 kit  0  . escitalopram (LEXAPRO) 10 MG tablet Take 1 tablet (10 mg total) by mouth daily.  30 tablet  3  . fenofibrate 160 MG tablet       . fluticasone (FLONASE) 50 MCG/ACT nasal spray       . INVOKANA 100 MG TABS TAKE 1 TABLET (100 MG TOTAL) BY MOUTH DAILY.  30 tablet  3  . JANUVIA 100 MG tablet       . Lancets (ACCU-CHEK MULTICLIX) lancets Use to check blood sugar once daily as instructed.  DX 250.00  100 each  2  . lisinopril (PRINIVIL,ZESTRIL) 2.5 MG tablet TAKE 1 TABLET BY MOUTH ONCE DAILY  30 tablet  3  . metFORMIN (GLUCOPHAGE-XR) 500 MG 24 hr tablet       . mometasone (NASONEX) 50 MCG/ACT nasal spray Place 2 sprays into the nose daily as needed.  17 g  6  . Multiple Vitamins-Minerals (CENTRUM SILVER PO) Take 1 tablet by mouth daily.        . ondansetron (ZOFRAN) 4 MG tablet Take 1 tablet (4 mg total) by mouth every 8 (eight) hours as needed for nausea.  60 tablet  1  . pantoprazole  (PROTONIX) 40 MG tablet Take 1 tablet (40 mg total) by mouth daily.  30 tablet  11  . Probiotic Product (ALIGN) 4 MG CAPS Take 1 capsule by mouth daily.        . sertraline (ZOLOFT) 50 MG tablet       . valACYclovir (VALTREX) 500 MG tablet Take 1 tablet (500 mg total) by mouth daily.  30 tablet  5   No current facility-administered medications on file prior to visit.    Allergies  Allergen Reactions  . Compazine [Prochlorperazine Edisylate]   . Prochlorperazine Edisylate Other (See Comments)    coma for 3 days  . Tetanus Toxoid Other (See Comments)    convulsions    Review of Systems  Review of Systems  Constitutional: Negative for fever and malaise/fatigue.  HENT: Negative for congestion.   Eyes: Negative for discharge.  Respiratory: Negative for shortness of breath.   Cardiovascular: Negative for chest pain, palpitations and leg swelling.  Gastrointestinal: Positive for diarrhea. Negative for nausea and abdominal pain.  Genitourinary: Negative for dysuria.  Musculoskeletal: Positive for joint pain. Negative for falls.       Right wrist pain  Skin: Negative for rash.  Neurological: Negative for loss of consciousness and headaches.  Endo/Heme/Allergies: Negative for polydipsia.  Psychiatric/Behavioral: Negative for depression and suicidal ideas. The patient is not nervous/anxious and does not have insomnia.     Objective  BP 120/80  Pulse 77  Temp(Src) 98 F (36.7 C) (Oral)  Ht 5' 3.5" (1.613 m)  Wt 150 lb 0.6 oz (68.058 kg)  BMI 26.16 kg/m2  SpO2 95%  Physical Exam  Physical Exam  Constitutional: She is oriented to person, place, and time and well-developed, well-nourished, and in no distress. No distress.  HENT:  Head: Normocephalic and atraumatic.  Eyes: Conjunctivae are normal.  Neck: Neck supple. No thyromegaly present.  Cardiovascular: Normal rate, regular rhythm and normal heart sounds.   No murmur heard. Pulmonary/Chest: Effort normal and breath sounds  normal. She has no wheezes.  Abdominal: She exhibits no distension and no mass.  Musculoskeletal:  She exhibits no edema.  Lymphadenopathy:    She has no cervical adenopathy.  Neurological: She is alert and oriented to person, place, and time.  Skin: Skin is warm and dry. No rash noted. She is not diaphoretic.  Psychiatric: Memory, affect and judgment normal.    Lab Results  Component Value Date   TSH 4.532* 09/05/2013   Lab Results  Component Value Date   WBC 6.4 09/05/2013   HGB 14.2 09/05/2013   HCT 41.4 09/05/2013   MCV 92.0 09/05/2013   PLT 242 09/05/2013   Lab Results  Component Value Date   CREATININE 0.87 08/15/2013   BUN 21 08/15/2013   NA 139 08/15/2013   K 4.4 08/15/2013   CL 105 08/15/2013   CO2 23 08/15/2013   Lab Results  Component Value Date   ALT 25 08/15/2013   AST 16 08/15/2013   ALKPHOS 69 08/15/2013   BILITOT 0.3 08/15/2013   Lab Results  Component Value Date   CHOL 178 08/15/2013   Lab Results  Component Value Date   HDL 51 08/15/2013   Lab Results  Component Value Date   LDLCALC 48 08/15/2013   Lab Results  Component Value Date   TRIG 395* 08/15/2013   Lab Results  Component Value Date   CHOLHDL 3.5 08/15/2013     Assessment & Plan  HYPERTENSION Well controlled, no changes to meds. Encouraged heart healthy diet such as the DASH diet and exercise as tolerated.   HYPERLIPIDEMIA Tolerating statin, encouraged heart healthy diet, avoid trans fats, minimize simple carbs and saturated fats. Increase exercise as tolerated  DM (diabetes mellitus), type 2 with neurological complications TRRN1A acceptable, minimize simple carbs. Increase exercise as tolerated. Continue current meds  Irritable bowel syndrome - diarrhea predominant Avoid offending foods, start probiotics. Do not eat large meals in late evening and consider raising head of bed.

## 2013-11-15 ENCOUNTER — Ambulatory Visit: Payer: 59

## 2013-11-18 ENCOUNTER — Other Ambulatory Visit: Payer: Self-pay | Admitting: Family

## 2013-11-18 NOTE — Telephone Encounter (Signed)
Received call from pharmacy stating pt called and is heading out of town. Wants to pick up accu check fast clix lancets before leaving. Gave verbal to dispense #[phone] x 2 refills.

## 2013-11-22 ENCOUNTER — Ambulatory Visit (INDEPENDENT_AMBULATORY_CARE_PROVIDER_SITE_OTHER): Payer: 59 | Admitting: Family

## 2013-11-22 ENCOUNTER — Encounter: Payer: Self-pay | Admitting: Family

## 2013-11-22 ENCOUNTER — Ambulatory Visit: Payer: 59

## 2013-11-22 VITALS — BP 130/80 | HR 81 | Temp 97.7°F | Resp 16 | Ht 63.5 in | Wt 151.0 lb

## 2013-11-22 DIAGNOSIS — E1149 Type 2 diabetes mellitus with other diabetic neurological complication: Secondary | ICD-10-CM

## 2013-11-22 MED ORDER — GLIMEPIRIDE 1 MG PO TABS: 1.0000 mg | ORAL_TABLET | Freq: Every day | ORAL | Status: DC

## 2013-11-22 NOTE — Progress Notes (Signed)
Pre visit review using our clinic review tool, if applicable. No additional management support is needed unless otherwise documented below in the visit note. 

## 2013-11-22 NOTE — Assessment & Plan Note (Signed)
Mild hyperglycemia.  Continue invokana and Januvia.  Add low dose amaryl. Follow up with Dr. Charlett Blake in 1 month- call if sugars fail to improve.

## 2013-11-22 NOTE — Progress Notes (Signed)
Subjective:    Patient ID: Crystal [name], female    DOB: [birth year], 63 y.o.   MRN: [id number]  HPI  Crystal [name] is a 63 yr old female who presents today with chief complaint of hyperglycemia. Her current diabetic medications include invokana and  [place]. Reports her sugars have been running 170-210 for the last few days. Denies dietary changes. Metformin was discontinued about 6 weeks ago due to diarrhea. She notes improvement in diarrhea since she discontinued metformin.    Review of Systems See HPI  Past Medical History  Diagnosis Date  . Hypertension   . Hyperlipemia   . Dyspepsia   . OSA (obstructive sleep apnea)   . Plantar fasciitis   . Depression   . Asthma     02/08/10 FEV1 1.98 (80%), s/p saba 2.22 l/m (90%).nml  . Anxiety   . 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 Fatty liver 12/19/2010  . Internal hemorrhoids   . Diverticulosis   . Allergy     dust, cock roach,grass,weeds,molds  . GERD (gastroesophageal reflux disease)   . Acute peptic ulcer of stomach     As a teenager  . IBS (irritable bowel syndrome)   . Cataract     History   Social History  . Marital Status: Married    Spouse Name: [name]    Number of Children: 4  . 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 Smoking status: Never Smoker   . Smokeless tobacco: Never Used  . Alcohol Use: No  . Drug Use: No  . Sexual Activity: Not on file   Other Topics Concern  . Not on file   Social History Narrative   No caffeine drinks daily     Past Surgical History  Procedure Laterality Date  . Knee arthroscopy Left 1998  . Toe surgery Right 1995  . Nasal septum surgery    . Rhinoplasty    . Appendectomy      age 36  . Colonoscopy  06/03/2010, 08/25/2011    diverticulosis   . Gastrectomy      partial - ulcer as a teen    Family History  Problem Relation Age of Onset  . Diabetes Mother     type II  . Heart attack Mother 23  .  Ulcers      bleeding gastric  . Colon cancer Sister   . Heart defect      child  . Other      perforated bowel, child    Allergies  Allergen Reactions  . Compazine [Prochlorperazine Edisylate]   . Prochlorperazine Edisylate Other (See Comments)    coma for 3 days  . Tetanus Toxoid Other (See Comments)    convulsions    Current Outpatient Prescriptions on File Prior to Visit  Medication Sig Dispense Refill  . ACCU-CHEK AVIVA PLUS test strip TEST BLOOD SUGARS 1 TO 2 TIMES A DAY  50 each  3  . ACCU-CHEK FASTCLIX LANCETS MISC USE TO CHECK BLOOD GLUCOSE TWICE DAILY  102 each  2  . albuterol (PROAIR HFA) 108 (90 BASE) MCG/ACT inhaler Inhale 2 puffs into the lungs every 4 (four) hours as needed. Shortness of breath and wheezing  18 g  2  . alosetron (LOTRONEX) 1 MG tablet Take 1 tablet (1 mg total) by mouth 2 (two) times daily. And as directed  60 tablet  5  .  aspirin 81 MG tablet Take 81 mg by mouth daily.      Marland Kitchen atorvastatin (LIPITOR) 80 MG tablet Take 1 tablet (80 mg total) by mouth daily.  30 tablet  3  . Blood Glucose Monitoring Suppl (ACCU-CHEK AVIVA PLUS) W/DEVICE KIT 1 kit by Does not apply route daily. DX 250.00  1 kit  0  . clotrimazole-betamethasone (LOTRISONE) cream       . escitalopram (LEXAPRO) 10 MG tablet Take 1 tablet (10 mg total) by mouth daily.  30 tablet  3  . fenofibrate 160 MG tablet       . fluticasone (FLONASE) 50 MCG/ACT nasal spray       . INVOKANA 100 MG TABS TAKE 1 TABLET (100 MG TOTAL) BY MOUTH DAILY.  30 tablet  3  . JANUVIA 100 MG tablet       . lisinopril (PRINIVIL,ZESTRIL) 2.5 MG tablet TAKE 1 TABLET BY MOUTH ONCE DAILY  30 tablet  3  . mometasone (NASONEX) 50 MCG/ACT nasal spray Place 2 sprays into the nose daily as needed.  17 g  6  . Multiple Vitamins-Minerals (CENTRUM SILVER PO) Take 1 tablet by mouth daily.        . nitroGLYCERIN (NITRODUR - DOSED IN MG/24 HR) 0.2 mg/hr patch       . ondansetron (ZOFRAN) 4 MG tablet Take 1 tablet (4 mg total) by  mouth every 8 (eight) hours as needed for nausea.  60 tablet  1  . pantoprazole (PROTONIX) 40 MG tablet Take 1 tablet (40 mg total) by mouth daily.  30 tablet  11  . Probiotic Product (ALIGN) 4 MG CAPS Take 1 capsule by mouth daily.        . sertraline (ZOLOFT) 50 MG tablet       . valACYclovir (VALTREX) 500 MG tablet Take 1 tablet (500 mg total) by mouth daily.  30 tablet  5   No current facility-administered medications on file prior to visit.    BP 130/80  Pulse 81  Temp(Src) 97.7 F (36.5 C) (Axillary)  Resp 16  Ht 5' 3.5" (1.613 m)  Wt 151 lb (68.493 kg)  BMI 26.33 kg/m2  SpO2 99%       Objective:   Physical Exam  Constitutional: She is oriented to person, place, and time. She appears well-developed and well-nourished. No distress.  Cardiovascular: Normal rate and regular rhythm.   No murmur heard. Pulmonary/Chest: Effort normal and breath sounds normal. No respiratory distress. She has no wheezes. She has no rales. She exhibits no tenderness.  Neurological: She is alert and oriented to person, place, and time.  Psychiatric: She has a normal mood and affect. Her behavior is normal. Judgment and thought content normal.          Assessment & Plan:

## 2013-11-22 NOTE — Patient Instructions (Signed)
Start amaryl. Follow up in 1 month.

## 2013-11-28 ENCOUNTER — Ambulatory Visit: Payer: 59 | Admitting: Family

## 2013-11-29 ENCOUNTER — Ambulatory Visit: Payer: 59

## 2013-12-01 ENCOUNTER — Ambulatory Visit (INDEPENDENT_AMBULATORY_CARE_PROVIDER_SITE_OTHER): Payer: 59 | Admitting: Pulmonary Disease

## 2013-12-01 ENCOUNTER — Encounter: Payer: Self-pay | Admitting: Pulmonary Disease

## 2013-12-01 VITALS — BP 120/78 | HR 87 | Wt 148.1 lb

## 2013-12-01 DIAGNOSIS — G4733 Obstructive sleep apnea (adult) (pediatric): Secondary | ICD-10-CM

## 2013-12-01 DIAGNOSIS — J45909 Unspecified asthma, uncomplicated: Secondary | ICD-10-CM

## 2013-12-01 NOTE — Progress Notes (Signed)
   Subjective:    Patient ID: Crystal [name], female    DOB: Feb 01, 1951, 63 y.o.   MRN: [id number]  HPI  63 yo female with known hx of HTN, DM and Hyperlipidmia for FU of asthma & OSA.  She was hospitalized at [facility] 02/2010 for asthma exacerbation. Was referred to [facility] where she was started on [facility] but had no improvement and use proair 3 x She does have sinus congestion, drainage intermittent and does have some intermittent GERD despite PPI daily.  spirometry >> Baseline shows restriction with FEV1 80% (but nml ratio), improved to 102 % post BD.  Stress test nml  2011-PSG with  [facility] >>AHI 37 corrected by CPAP 11 cm, oronasal mask ([facility])  RAST neg- IgE 23  06/03/2012  Her husband of 25 y passed away due to metastatic melanoma  Spirometry showed nml lung function-fev1 100%       12/01/2013  Chief Complaint  Patient presents with  . Follow-up    Breathing is doing well. recently traveled to [place] and did fine. Still wears CPAP everynight.     advair was stopped last visit Pt states she has mostly good days  with her breathing. She denies any cough as of now. Only uses albuterol 1-2 /wk Pt reports she wears her CPAP everynight x every night.  Lost 8 lbs -last 35mnths  Review of Systems neg for any significant sore throat, dysphagia, itching, sneezing, nasal congestion or excess/ purulent secretions, fever, chills, sweats, unintended wt loss, pleuritic or exertional cp, hempoptysis, orthopnea pnd or change in chronic leg swelling. Also denies presyncope, palpitations, heartburn, abdominal pain, nausea, vomiting, diarrhea or change in bowel or urinary habits, dysuria,hematuria, rash, arthralgias, visual complaints, headache, numbness weakness or ataxia.     Objective:   Physical Exam  Gen. Pleasant, well-nourished, in no distress ENT - no lesions, no post nasal drip Neck: No JVD, no thyromegaly, no carotid bruits Lungs:  no use of accessory muscles, no dullness to percussion, clear without rales or rhonchi  Cardiovascular: Rhythm regular, heart sounds  normal, no murmurs or gallops, no peripheral edema Musculoskeletal: No deformities, no cyanosis or clubbing        Assessment & Plan:

## 2013-12-01 NOTE — Assessment & Plan Note (Signed)
OK to stay off advair Albuterol prn

## 2013-12-01 NOTE — Assessment & Plan Note (Addendum)
Congratulations on your weight loss Consider repeat your study next year  Weight loss encouraged, compliance with goal of at least 4-6 hrs every night is the expectation. Advised against medications with sedative side effects Cautioned against driving when sleepy - understanding that sleepiness will vary on a day to day basis

## 2013-12-01 NOTE — Patient Instructions (Signed)
Congratulations on your weight loss OK to stay off advair Consider repeat your study next year

## 2013-12-09 ENCOUNTER — Telehealth: Payer: Self-pay | Admitting: Family Medicine

## 2013-12-09 ENCOUNTER — Encounter: Payer: Self-pay | Admitting: Physician Assistant

## 2013-12-09 ENCOUNTER — Ambulatory Visit (INDEPENDENT_AMBULATORY_CARE_PROVIDER_SITE_OTHER): Payer: 59 | Admitting: Physician Assistant

## 2013-12-09 VITALS — BP 138/86 | HR 78 | Temp 97.9°F | Resp 18 | Ht 63.5 in | Wt 153.2 lb

## 2013-12-09 DIAGNOSIS — J329 Chronic sinusitis, unspecified: Secondary | ICD-10-CM | POA: Insufficient documentation

## 2013-12-09 MED ORDER — HYDROCOD POLST-CHLORPHEN POLST 10-8 MG/5ML PO LQCR: 5.0000 mL | Freq: Two times a day (BID) | ORAL | Status: DC | PRN

## 2013-12-09 MED ORDER — AMOXICILLIN-POT CLAVULANATE 875-125 MG PO TABS: 1.0000 | ORAL_TABLET | Freq: Two times a day (BID) | ORAL | Status: DC

## 2013-12-09 NOTE — Telephone Encounter (Signed)
Patient states that she is having a hard time staying awake at her desk. She is wanting to know if this could be a side effect to one of her medications?

## 2013-12-09 NOTE — Patient Instructions (Signed)
Please take antibiotic as directed.  Increase fluid intake.  Use Saline nasal spray.  Take a daily multivitamin. Continue Flonase.  Use Tussionex as directed for cough.  Place a humidifier in the bedroom.  Please call or return clinic if symptoms are not improving.  Sinusitis Sinusitis is redness, soreness, and swelling (inflammation) of the paranasal sinuses. Paranasal sinuses are air pockets within the bones of your face (beneath the eyes, the middle of the forehead, or above the eyes). In healthy paranasal sinuses, mucus is able to drain out, and air is able to circulate through them by way of your nose. However, when your paranasal sinuses are inflamed, mucus and air can become trapped. This can allow bacteria and other germs to grow and cause infection. Sinusitis can develop quickly and last only a short time (acute) or continue over a long period (chronic). Sinusitis that lasts for more than 12 weeks is considered chronic.  CAUSES  Causes of sinusitis include:  Allergies.  Structural abnormalities, such as displacement of the cartilage that separates your nostrils (deviated septum), which can decrease the air flow through your nose and sinuses and affect sinus drainage.  Functional abnormalities, such as when the small hairs (cilia) that line your sinuses and help remove mucus do not work properly or are not present. SYMPTOMS  Symptoms of acute and chronic sinusitis are the same. The primary symptoms are pain and pressure around the affected sinuses. Other symptoms include:  Upper toothache.  Earache.  Headache.  Bad breath.  Decreased sense of smell and taste.  A cough, which worsens when you are lying flat.  Fatigue.  Fever.  Thick drainage from your nose, which often is green and may contain pus (purulent).  Swelling and warmth over the affected sinuses. DIAGNOSIS  Your caregiver will perform a physical exam. During the exam, your caregiver may:  Look in your nose for  signs of abnormal growths in your nostrils (nasal polyps).  Tap over the affected sinus to check for signs of infection.  View the inside of your sinuses (endoscopy) with a special imaging device with a light attached (endoscope), which is inserted into your sinuses. If your caregiver suspects that you have chronic sinusitis, one or more of the following tests may be recommended:  Allergy tests.  Nasal culture A sample of mucus is taken from your nose and sent to a lab and screened for bacteria.  Nasal cytology A sample of mucus is taken from your nose and examined by your caregiver to determine if your sinusitis is related to an allergy. TREATMENT  Most cases of acute sinusitis are related to a viral infection and will resolve on their own within 10 days. Sometimes medicines are prescribed to help relieve symptoms (pain medicine, decongestants, nasal steroid sprays, or saline sprays).  However, for sinusitis related to a bacterial infection, your caregiver will prescribe antibiotic medicines. These are medicines that will help kill the bacteria causing the infection.  Rarely, sinusitis is caused by a fungal infection. In theses cases, your caregiver will prescribe antifungal medicine. For some cases of chronic sinusitis, surgery is needed. Generally, these are cases in which sinusitis recurs more than 3 times per year, despite other treatments. HOME CARE INSTRUCTIONS   Drink plenty of water. Water helps thin the mucus so your sinuses can drain more easily.  Use a humidifier.  Inhale steam 3 to 4 times a day (for example, sit in the bathroom with the shower running).  Apply a warm, moist washcloth  to your face 3 to 4 times a day, or as directed by your caregiver.  Use saline nasal sprays to help moisten and clean your sinuses.  Take over-the-counter or prescription medicines for pain, discomfort, or fever only as directed by your caregiver. SEEK IMMEDIATE MEDICAL CARE IF:  You have  increasing pain or severe headaches.  You have nausea, vomiting, or drowsiness.  You have swelling around your face.  You have vision problems.  You have a stiff neck.  You have difficulty breathing. MAKE SURE YOU:   Understand these instructions.  Will watch your condition.  Will get help right away if you are not doing well or get worse. Document Released: 06/30/2005 Document Revised: 09/22/2011 Document Reviewed: 07/15/2011 [facility] Patient Information 2014 [place], [place].

## 2013-12-09 NOTE — Assessment & Plan Note (Addendum)
Rx Augmentin.  Rx Tussionex.  Increase fluids.  Rest. Saline nasal spray.  Continue albuterol as directed, PRN. Humidifier in bedroom.  Call or return to clinic if symptoms are not improving.

## 2013-12-09 NOTE — Progress Notes (Signed)
Patient presents to clinic today c/o sinus pressure, sinus pain, ear pain, fatigue, PND and sore throat.  Patient also endorses cough that is mostly non-productive.  Has history of asthma but denies SOB, wheezing or increased use of albuterol inhaler. Denies recent travel or sick contact.   Past Medical History  Diagnosis Date  . Hypertension   . Hyperlipemia   . Dyspepsia   . OSA (obstructive sleep apnea)   . Plantar fasciitis   . Depression   . Asthma     02/08/10 FEV1 1.98 (80%), s/p saba 2.22 l/m (90%).nml  . Anxiety   . 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 Fatty liver 12/19/2010  . Internal hemorrhoids   . Diverticulosis   . Allergy     dust, cock roach,grass,weeds,molds  . GERD (gastroesophageal reflux disease)   . Acute peptic ulcer of stomach     As a teenager  . IBS (irritable bowel syndrome)   . Cataract     Current Outpatient Prescriptions on File Prior to Visit  Medication Sig Dispense Refill  . ACCU-CHEK AVIVA PLUS test strip TEST BLOOD SUGARS 1 TO 2 TIMES A DAY  50 each  3  . ACCU-CHEK FASTCLIX LANCETS MISC USE TO CHECK BLOOD GLUCOSE TWICE DAILY  102 each  2  . albuterol (PROAIR HFA) 108 (90 BASE) MCG/ACT inhaler Inhale 2 puffs into the lungs every 4 (four) hours as needed. Shortness of breath and wheezing  18 g  2  . alosetron (LOTRONEX) 1 MG tablet Take 1 tablet (1 mg total) by mouth 2 (two) times daily. And as directed  60 tablet  5  . aspirin 81 MG tablet Take 81 mg by mouth daily.      Marland Kitchen atorvastatin (LIPITOR) 80 MG tablet Take 1 tablet (80 mg total) by mouth daily.  30 tablet  3  . Blood Glucose Monitoring Suppl (ACCU-CHEK AVIVA PLUS) W/DEVICE KIT 1 kit by Does not apply route daily. DX 250.00  1 kit  0  . clotrimazole-betamethasone (LOTRISONE) cream       . escitalopram (LEXAPRO) 10 MG tablet Take 1 tablet (10 mg total) by mouth daily.  30 tablet  3  . fenofibrate 160 MG tablet Take 160 mg by mouth at bedtime.       . fluticasone (FLONASE) 50 MCG/ACT nasal  spray       . glimepiride (AMARYL) 1 MG tablet Take 1 tablet (1 mg total) by mouth daily with breakfast.  30 tablet  2  . INVOKANA 100 MG TABS TAKE 1 TABLET (100 MG TOTAL) BY MOUTH DAILY.  30 tablet  3  . JANUVIA 100 MG tablet Take 100 mg by mouth daily.       Marland Kitchen lisinopril (PRINIVIL,ZESTRIL) 2.5 MG tablet TAKE 1 TABLET BY MOUTH ONCE DAILY  30 tablet  3  . mometasone (NASONEX) 50 MCG/ACT nasal spray Place 2 sprays into the nose daily as needed.  17 g  6  . Multiple Vitamins-Minerals (CENTRUM SILVER PO) Take 1 tablet by mouth daily.        . nitroGLYCERIN (NITRODUR - DOSED IN MG/24 HR) 0.2 mg/hr patch       . ondansetron (ZOFRAN) 4 MG tablet Take 1 tablet (4 mg total) by mouth every 8 (eight) hours as needed for nausea.  60 tablet  1  . pantoprazole (PROTONIX) 40 MG tablet Take 1 tablet (40 mg total) by mouth daily.  30 tablet  11  . Probiotic Product (ALIGN)  4 MG CAPS Take 1 capsule by mouth daily.        . sertraline (ZOLOFT) 50 MG tablet Take 50 mg by mouth daily.       . valACYclovir (VALTREX) 500 MG tablet Take 1 tablet (500 mg total) by mouth daily.  30 tablet  5   No current facility-administered medications on file prior to visit.    Allergies  Allergen Reactions  . Compazine [Prochlorperazine Edisylate]   . Prochlorperazine Edisylate Other (See Comments)    coma for 3 days  . Tetanus Toxoid Other (See Comments)    convulsions    Family History  Problem Relation Age of Onset  . Diabetes Mother     type II  . Heart attack Mother 61  . Ulcers      bleeding gastric  . Colon cancer Sister   . Heart defect      child  . Other      perforated bowel, child    History   Social History  . Marital Status: Married    Spouse Name: [name]    Number of Children: 4  . 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 Smoking status: Never Smoker   . Smokeless tobacco: Never Used  . Alcohol Use: No  . Drug Use: No  . Sexual  Activity: None   Other Topics Concern  . None   Social History Narrative   No caffeine drinks daily    Review of Systems - See HPI.  All other ROS are negative.  BP 138/86  Pulse 78  Temp(Src) 97.9 F (36.6 C) (Oral)  Resp 18  Ht 5' 3.5" (1.613 m)  Wt 153 lb 4 oz (69.514 kg)  BMI 26.72 kg/m2  SpO2 98%  Physical Exam  Vitals reviewed. Constitutional: She is oriented to person, place, and time and well-developed, well-nourished, and in no distress.  HENT:  Head: Normocephalic and atraumatic.  Right Ear: External ear normal.  Left Ear: External ear normal.  Nose: Nose normal.  Mouth/Throat: Oropharynx is clear and moist. No oropharyngeal exudate.  TM within normal limits bilaterally.  + TTP of sinuses noted on exam.   Eyes: Conjunctivae are normal. Pupils are equal, round, and reactive to light.  Neck: Neck supple.  Cardiovascular: Normal rate, regular rhythm, normal heart sounds and intact distal pulses.   Pulmonary/Chest: Effort normal and breath sounds normal. No respiratory distress. She has no wheezes. She has no rales. She exhibits no tenderness.  Lymphadenopathy:    She has no cervical adenopathy.  Neurological: She is alert and oriented to person, place, and time.  Skin: Skin is warm and dry. No rash noted.  Psychiatric: Affect normal.   Assessment/Plan: Sinusitis Rx Augmentin.  Rx Tussionex.  Increase fluids.  Rest. Saline nasal spray.  Continue albuterol as directed, PRN. Humidifier in bedroom.  Call or return to clinic if symptoms are not improving.

## 2013-12-09 NOTE — Telephone Encounter (Signed)
Unlikely but Lexapro is possible, take at bedtime. Also hi sugars can make you sleepy, how are they

## 2013-12-09 NOTE — Telephone Encounter (Signed)
Patient called back on this. I asked her about her blood sugar and she states that she checked it around lunchtime and it was 135. She states that her sinuses have been bothering her. She does not think that it is her blood sugar, that it is just sinus related. Appointment scheduled for 3:15 with Elyn Aquas.

## 2013-12-09 NOTE — Progress Notes (Signed)
Pre visit review using our clinic review tool, if applicable. No additional management support is needed unless otherwise documented below in the visit note/SLS  

## 2013-12-27 ENCOUNTER — Ambulatory Visit (INDEPENDENT_AMBULATORY_CARE_PROVIDER_SITE_OTHER): Payer: 59 | Admitting: Family Medicine

## 2013-12-27 VITALS — BP 120/80 | HR 69 | Temp 97.5°F | Ht 63.5 in | Wt 151.1 lb

## 2013-12-27 DIAGNOSIS — J018 Other acute sinusitis: Secondary | ICD-10-CM

## 2013-12-27 DIAGNOSIS — I10 Essential (primary) hypertension: Secondary | ICD-10-CM

## 2013-12-27 DIAGNOSIS — Z23 Encounter for immunization: Secondary | ICD-10-CM

## 2013-12-27 DIAGNOSIS — J3089 Other allergic rhinitis: Secondary | ICD-10-CM

## 2013-12-27 DIAGNOSIS — E785 Hyperlipidemia, unspecified: Secondary | ICD-10-CM

## 2013-12-27 DIAGNOSIS — K219 Gastro-esophageal reflux disease without esophagitis: Secondary | ICD-10-CM

## 2013-12-27 DIAGNOSIS — E1149 Type 2 diabetes mellitus with other diabetic neurological complication: Secondary | ICD-10-CM

## 2013-12-27 DIAGNOSIS — E119 Type 2 diabetes mellitus without complications: Secondary | ICD-10-CM

## 2013-12-27 LAB — CBC
HCT: 41 % (ref 36.0–46.0)
Hemoglobin: 13.9 g/dL (ref 12.0–15.0)
MCH: 30.9 pg (ref 26.0–34.0)
MCHC: 33.9 g/dL (ref 30.0–36.0)
MCV: 91.1 fL (ref 78.0–100.0)
Platelets: 234 10*3/uL (ref 150–400)
RBC: 4.5 MIL/uL (ref 3.87–5.11)
RDW: 12.7 % (ref 11.5–15.5)
WBC: 6.7 10*3/uL (ref 4.0–10.5)

## 2013-12-27 MED ORDER — ZOSTER VACCINE LIVE 19400 UNT/0.65ML ~~LOC~~ SOLR: 0.6500 mL | Freq: Once | SUBCUTANEOUS | Status: DC

## 2013-12-27 NOTE — Progress Notes (Signed)
Pre visit review using our clinic review tool, if applicable. No additional management support is needed unless otherwise documented below in the visit note.  Pt is aware that the Zostavax injection may not be covered by insurance and states she still wants the injection

## 2013-12-27 NOTE — Patient Instructions (Signed)

## 2013-12-28 ENCOUNTER — Telehealth: Payer: Self-pay | Admitting: Family Medicine

## 2013-12-28 LAB — RENAL FUNCTION PANEL
ALBUMIN: 4.1 g/dL (ref 3.5–5.2)
BUN: 15 mg/dL (ref 6–23)
CHLORIDE: 106 meq/L (ref 96–112)
CO2: 24 mEq/L (ref 19–32)
CREATININE: 0.72 mg/dL (ref 0.50–1.10)
Calcium: 9.2 mg/dL (ref 8.4–10.5)
Glucose, Bld: 141 mg/dL — ABNORMAL HIGH (ref 70–99)
PHOSPHORUS: 3.8 mg/dL (ref 2.3–4.6)
Potassium: 3.8 mEq/L (ref 3.5–5.3)
Sodium: 139 mEq/L (ref 135–145)

## 2013-12-28 LAB — LIPID PANEL
Cholesterol: 189 mg/dL (ref 0–200)
HDL: 49 mg/dL (ref >39)
LDL Cholesterol: 94 mg/dL (ref 0–99)
TRIGLYCERIDES: 228 mg/dL — AB (ref <150)
Total CHOL/HDL Ratio: 3.9 Ratio
VLDL: 46 mg/dL — ABNORMAL HIGH (ref 0–40)

## 2013-12-28 LAB — HEPATIC FUNCTION PANEL
ALK PHOS: 62 U/L (ref 39–117)
ALT: 21 U/L (ref 0–35)
AST: 18 U/L (ref 0–37)
Albumin: 4.1 g/dL (ref 3.5–5.2)
Bilirubin, Direct: 0.1 mg/dL (ref 0.0–0.3)
Indirect Bilirubin: 0.5 mg/dL (ref 0.2–1.2)
TOTAL PROTEIN: 6.7 g/dL (ref 6.0–8.3)
Total Bilirubin: 0.6 mg/dL (ref 0.2–1.2)

## 2013-12-28 LAB — TSH: TSH: 2.819 u[IU]/mL (ref 0.350–4.500)

## 2013-12-28 LAB — T4, FREE: FREE T4: 1.22 ng/dL (ref 0.80–1.80)

## 2013-12-28 LAB — HEMOGLOBIN A1C
Hgb A1c MFr Bld: 6.9 % — ABNORMAL HIGH (ref <5.7)
MEAN PLASMA GLUCOSE: 151 mg/dL — AB (ref <117)

## 2013-12-28 NOTE — Telephone Encounter (Signed)
Relevant patient education assigned to patient using Emmi. ° °

## 2014-01-04 ENCOUNTER — Telehealth: Payer: Self-pay | Admitting: Family Medicine

## 2014-01-04 ENCOUNTER — Encounter: Payer: Self-pay | Admitting: Family Medicine

## 2014-01-04 NOTE — Assessment & Plan Note (Signed)
Good response to recent treatment, encouraged to routine probiotics

## 2014-01-04 NOTE — Assessment & Plan Note (Signed)
Tolerating statin, encouraged heart healthy diet, avoid trans fats, minimize simple carbs and saturated fats. Increase exercise as tolerated 

## 2014-01-04 NOTE — Assessment & Plan Note (Signed)
Avoid offending foods, start probiotics. Do not eat large meals in late evening and consider raising head of bed.  

## 2014-01-04 NOTE — Telephone Encounter (Signed)
OK have her take 1/2 tab at bed for a week before stopping if she is able and have her let me know if she wants to try something else

## 2014-01-04 NOTE — Assessment & Plan Note (Signed)
Good response to Nasonex prn

## 2014-01-04 NOTE — Progress Notes (Signed)
Patient ID: Crystal [name], female   DOB: February 16, 1951, 63 y.o.   MRN: [id number] LILLYN WIECZOREK [id number] [birth year] 01/04/2014      Progress Note-Follow Up  Subjective  Chief Complaint  Chief Complaint  Patient presents with  . Follow-up    2 month  . Injections    zostavax    HPI  Patient is a 63 year old female in today for routine medical care. For followup. Generally feeling well. Blood sugars well-controlled. Her sinusitis we recently treated has done well. No symptoms at this time. Has been struggling with some pain in her great toes he did have a blister on her right foot as well. No fevers or chills. Denies CP/palp/SOB/HA/congestion/fevers/GI or GU c/o. Taking meds as prescribed  Past Medical History  Diagnosis Date  . Hypertension   . Hyperlipemia   . Dyspepsia   . OSA (obstructive sleep apnea)   . Plantar fasciitis   . Depression   . Asthma     02/08/10 FEV1 1.98 (80%), s/p saba 2.22 l/m (90%).nml  . Anxiety   . 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 Fatty liver 12/19/2010  . Internal hemorrhoids   . Diverticulosis   . Allergy     dust, cock roach,grass,weeds,molds  . GERD (gastroesophageal reflux disease)   . Acute peptic ulcer of stomach     As a teenager  . IBS (irritable bowel syndrome)   . Cataract     Past Surgical History  Procedure Laterality Date  . Knee arthroscopy Left 1998  . Toe surgery Right 1995  . Nasal septum surgery    . Rhinoplasty    . Appendectomy      age 61  . Colonoscopy  06/03/2010, 08/25/2011    diverticulosis   . Gastrectomy      partial - ulcer as a teen    Family History  Problem Relation Age of Onset  . Diabetes Mother     type II  . Heart attack Mother 67  . Ulcers      bleeding gastric  . Colon cancer Sister   . Heart defect      child  . Other      perforated bowel, child    History   Social History  . Marital Status: Married    Spouse Name: [name]    Number of Children: 4  . 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 Smoking status: Never Smoker   . Smokeless tobacco: Never Used  . Alcohol Use: No  . Drug Use: No  . Sexual Activity: Not on file   Other Topics Concern  . Not on file   Social History Narrative   No caffeine drinks daily     Current Outpatient Prescriptions on File Prior to Visit  Medication Sig Dispense Refill  . ACCU-CHEK AVIVA PLUS test strip TEST BLOOD SUGARS 1 TO 2 TIMES A DAY  50 each  3  . ACCU-CHEK FASTCLIX LANCETS MISC USE TO CHECK BLOOD GLUCOSE TWICE DAILY  102 each  2  . albuterol (PROAIR HFA) 108 (90 BASE) MCG/ACT inhaler Inhale 2 puffs into the lungs every 4 (four) hours as needed. Shortness of breath and wheezing  18 g  2  . alosetron (LOTRONEX) 1 MG tablet Take 1 tablet (1 mg total) by mouth 2 (two) times daily. And as directed  60 tablet  5  .  aspirin 81 MG tablet Take 81 mg by mouth daily.      Marland Kitchen atorvastatin (LIPITOR) 80 MG tablet Take 1 tablet (80 mg total) by mouth daily.  30 tablet  3  . Blood Glucose Monitoring Suppl (ACCU-CHEK AVIVA PLUS) W/DEVICE KIT 1 kit by Does not apply route daily. DX 250.00  1 kit  0  . clotrimazole-betamethasone (LOTRISONE) cream       . escitalopram (LEXAPRO) 10 MG tablet Take 1 tablet (10 mg total) by mouth daily.  30 tablet  3  . fenofibrate 160 MG tablet Take 160 mg by mouth at bedtime.       . fluticasone (FLONASE) 50 MCG/ACT nasal spray       . glimepiride (AMARYL) 1 MG tablet Take 1 tablet (1 mg total) by mouth daily with breakfast.  30 tablet  2  . INVOKANA 100 MG TABS TAKE 1 TABLET (100 MG TOTAL) BY MOUTH DAILY.  30 tablet  3  . JANUVIA 100 MG tablet Take 100 mg by mouth daily.       Marland Kitchen lisinopril (PRINIVIL,ZESTRIL) 2.5 MG tablet TAKE 1 TABLET BY MOUTH ONCE DAILY  30 tablet  3  . mometasone (NASONEX) 50 MCG/ACT nasal spray Place 2 sprays into the nose daily as needed.  17 g  6  . Multiple Vitamins-Minerals (CENTRUM SILVER PO) Take 1 tablet by  mouth daily.        . nitroGLYCERIN (NITRODUR - DOSED IN MG/24 HR) 0.2 mg/hr patch       . ondansetron (ZOFRAN) 4 MG tablet Take 1 tablet (4 mg total) by mouth every 8 (eight) hours as needed for nausea.  60 tablet  1  . pantoprazole (PROTONIX) 40 MG tablet Take 1 tablet (40 mg total) by mouth daily.  30 tablet  11  . Probiotic Product (ALIGN) 4 MG CAPS Take 1 capsule by mouth daily.        . sertraline (ZOLOFT) 50 MG tablet Take 50 mg by mouth daily.       . valACYclovir (VALTREX) 500 MG tablet Take 1 tablet (500 mg total) by mouth daily.  30 tablet  5   No current facility-administered medications on file prior to visit.    Allergies  Allergen Reactions  . Compazine [Prochlorperazine Edisylate]   . Prochlorperazine Edisylate Other (See Comments)    coma for 3 days  . Tetanus Toxoid Other (See Comments)    convulsions    Review of Systems  Review of Systems  Constitutional: Negative for fever and malaise/fatigue.  HENT: Negative for congestion.   Eyes: Negative for discharge.  Respiratory: Negative for shortness of breath.   Cardiovascular: Negative for chest pain, palpitations and leg swelling.  Gastrointestinal: Negative for nausea, abdominal pain and diarrhea.  Genitourinary: Negative for dysuria.  Musculoskeletal: Negative for falls.  Skin: Negative for rash.  Neurological: Negative for loss of consciousness and headaches.  Endo/Heme/Allergies: Negative for polydipsia.  Psychiatric/Behavioral: Negative for depression and suicidal ideas. The patient is not nervous/anxious and does not have insomnia.     Objective  BP 120/80  Pulse 69  Temp(Src) 97.5 F (36.4 C) (Oral)  Ht 5' 3.5" (1.613 m)  Wt 151 lb 1.9 oz (68.548 kg)  BMI 26.35 kg/m2  SpO2 98%  Physical Exam  Physical Exam  Constitutional: She is oriented to person, place, and time and well-developed, well-nourished, and in no distress. No distress.  HENT:  Head: Normocephalic and atraumatic.  Eyes:  Conjunctivae are normal.  Neck: Neck  supple. No thyromegaly present.  Cardiovascular: Normal rate, regular rhythm and normal heart sounds.   No murmur heard. Pulmonary/Chest: Effort normal and breath sounds normal. She has no wheezes.  Abdominal: She exhibits no distension and no mass.  Musculoskeletal: She exhibits no edema.  Lymphadenopathy:    She has no cervical adenopathy.  Neurological: She is alert and oriented to person, place, and time.  Skin: Skin is warm and dry. No rash noted. She is not diaphoretic.  Psychiatric: Memory, affect and judgment normal.    Lab Results  Component Value Date   TSH 2.819 12/27/2013   Lab Results  Component Value Date   WBC 6.7 12/27/2013   HGB 13.9 12/27/2013   HCT 41.0 12/27/2013   MCV 91.1 12/27/2013   PLT 234 12/27/2013   Lab Results  Component Value Date   CREATININE 0.72 12/27/2013   BUN 15 12/27/2013   NA 139 12/27/2013   K 3.8 12/27/2013   CL 106 12/27/2013   CO2 24 12/27/2013   Lab Results  Component Value Date   ALT 21 12/27/2013   AST 18 12/27/2013   ALKPHOS 62 12/27/2013   BILITOT 0.6 12/27/2013   Lab Results  Component Value Date   CHOL 189 12/27/2013   Lab Results  Component Value Date   HDL 49 12/27/2013   Lab Results  Component Value Date   LDLCALC 94 12/27/2013   Lab Results  Component Value Date   TRIG 228* 12/27/2013   Lab Results  Component Value Date   CHOLHDL 3.9 12/27/2013     Assessment & Plan  GERD (gastroesophageal reflux disease) Avoid offending foods, start probiotics. Do not eat large meals in late evening and consider raising head of bed.   HYPERLIPIDEMIA Tolerating statin, encouraged heart healthy diet, avoid trans fats, minimize simple carbs and saturated fats. Increase exercise as tolerated  Sinusitis Good response to recent treatment, encouraged to routine probiotics  DM (diabetes mellitus), type 2 with neurological complications [id number] acceptable, minimize simple carbs. Increase exercise as  tolerated. Continue current meds  Allergic rhinitis Good response to Nasonex prn

## 2014-01-04 NOTE — Telephone Encounter (Signed)
Pt is taking herself off of zoloft, making her sleepy

## 2014-01-04 NOTE — Assessment & Plan Note (Signed)
hgba1c acceptable, minimize simple carbs. Increase exercise as tolerated. Continue current meds 

## 2014-01-05 NOTE — Telephone Encounter (Signed)
Patient was notified. Patient expressed understanding.  

## 2014-01-09 ENCOUNTER — Other Ambulatory Visit: Payer: Self-pay | Admitting: Family

## 2014-01-17 ENCOUNTER — Other Ambulatory Visit: Payer: Self-pay | Admitting: Family

## 2014-01-17 ENCOUNTER — Telehealth: Payer: Self-pay

## 2014-01-17 ENCOUNTER — Encounter (HOSPITAL_BASED_OUTPATIENT_CLINIC_OR_DEPARTMENT_OTHER): Payer: Self-pay | Admitting: Emergency Medicine

## 2014-01-17 ENCOUNTER — Telehealth: Payer: Self-pay | Admitting: Family Medicine

## 2014-01-17 ENCOUNTER — Emergency Department (HOSPITAL_BASED_OUTPATIENT_CLINIC_OR_DEPARTMENT_OTHER)
Admission: EM | Admit: 2014-01-17 | Discharge: 2014-01-17 | Disposition: A | Discharge status: Home / Self Care | Payer: 59 | Attending: Emergency Medicine | Admitting: Emergency Medicine

## 2014-01-17 DIAGNOSIS — E162 Hypoglycemia, unspecified: Secondary | ICD-10-CM

## 2014-01-17 DIAGNOSIS — IMO0002 Reserved for concepts with insufficient information to code with codable children: Secondary | ICD-10-CM | POA: Insufficient documentation

## 2014-01-17 DIAGNOSIS — E785 Hyperlipidemia, unspecified: Secondary | ICD-10-CM | POA: Insufficient documentation

## 2014-01-17 DIAGNOSIS — F411 Generalized anxiety disorder: Secondary | ICD-10-CM | POA: Insufficient documentation

## 2014-01-17 DIAGNOSIS — K219 Gastro-esophageal reflux disease without esophagitis: Secondary | ICD-10-CM | POA: Insufficient documentation

## 2014-01-17 DIAGNOSIS — F329 Major depressive disorder, single episode, unspecified: Secondary | ICD-10-CM | POA: Insufficient documentation

## 2014-01-17 DIAGNOSIS — Z7982 Long term (current) use of aspirin: Secondary | ICD-10-CM | POA: Insufficient documentation

## 2014-01-17 DIAGNOSIS — E1169 Type 2 diabetes mellitus with other specified complication: Secondary | ICD-10-CM | POA: Insufficient documentation

## 2014-01-17 DIAGNOSIS — Z79899 Other long term (current) drug therapy: Secondary | ICD-10-CM | POA: Insufficient documentation

## 2014-01-17 DIAGNOSIS — I10 Essential (primary) hypertension: Secondary | ICD-10-CM | POA: Insufficient documentation

## 2014-01-17 DIAGNOSIS — J45909 Unspecified asthma, uncomplicated: Secondary | ICD-10-CM | POA: Insufficient documentation

## 2014-01-17 DIAGNOSIS — H269 Unspecified cataract: Secondary | ICD-10-CM | POA: Insufficient documentation

## 2014-01-17 DIAGNOSIS — F3289 Other specified depressive episodes: Secondary | ICD-10-CM | POA: Insufficient documentation

## 2014-01-17 LAB — BASIC METABOLIC PANEL
ANION GAP: 15 (ref 5–15)
BUN: 13 mg/dL (ref 6–23)
CALCIUM: 9.4 mg/dL (ref 8.4–10.5)
CHLORIDE: 105 meq/L (ref 96–112)
CO2: 23 mEq/L (ref 19–32)
Creatinine, Ser: 0.7 mg/dL (ref 0.50–1.10)
GFR calc Af Amer: >90 mL/min (ref >90)
GFR calc non Af Amer: 90 mL/min — ABNORMAL LOW (ref >90)
Glucose, Bld: 91 mg/dL (ref 70–99)
Potassium: 4.7 mEq/L (ref 3.7–5.3)
SODIUM: 143 meq/L (ref 137–147)

## 2014-01-17 LAB — CBC WITH DIFFERENTIAL/PLATELET
BASOS ABS: 0 10*3/uL (ref 0.0–0.1)
BASOS PCT: 0 % (ref 0–1)
Eosinophils Absolute: 0.1 10*3/uL (ref 0.0–0.7)
Eosinophils Relative: 1 % (ref 0–5)
HCT: 42.2 % (ref 36.0–46.0)
Hemoglobin: 14.1 g/dL (ref 12.0–15.0)
Lymphocytes Relative: 35 % (ref 12–46)
Lymphs Abs: 2.5 10*3/uL (ref 0.7–4.0)
MCH: 31.4 pg (ref 26.0–34.0)
MCHC: 33.4 g/dL (ref 30.0–36.0)
MCV: 94 fL (ref 78.0–100.0)
Monocytes Absolute: 0.6 10*3/uL (ref 0.1–1.0)
Monocytes Relative: 9 % (ref 3–12)
NEUTROS ABS: 3.9 10*3/uL (ref 1.7–7.7)
NEUTROS PCT: 55 % (ref 43–77)
Platelets: 245 10*3/uL (ref 150–400)
RBC: 4.49 MIL/uL (ref 3.87–5.11)
RDW: 12.4 % (ref 11.5–15.5)
WBC: 7 10*3/uL (ref 4.0–10.5)

## 2014-01-17 LAB — CBG MONITORING, ED
GLUCOSE-CAPILLARY: 86 mg/dL (ref 70–99)
Glucose-Capillary: 133 mg/dL — ABNORMAL HIGH (ref 70–99)
Glucose-Capillary: 101 mg/dL — ABNORMAL HIGH (ref 70–99)

## 2014-01-17 NOTE — Telephone Encounter (Signed)
Pt informed and voiced understanding

## 2014-01-17 NOTE — Telephone Encounter (Signed)
She can hold all of them temporarily but as her sugars trend up she should not stay off of them, if her numbers start running consistently above 150 restart the meds one at a time, hold Amaryl til next visit, have her come in a couple weeks with sugar log to review.

## 2014-01-17 NOTE — ED Notes (Signed)
Pt reports eating breakfast this morning, sts afterwards, she noticed that her CBG was dropping. Drank some OJ at work. Called EMS and decided to come be checked out. Pt denies fever, nausea, vomiting, dysuria and cough.

## 2014-01-17 NOTE — Telephone Encounter (Signed)
Patient called stating that she cut her zoloft in half because of being tired and about falling asleep at her desk. Pt states she still feels the same even taking 1/2 tablet?  Please advise?

## 2014-01-17 NOTE — ED Notes (Signed)
CBG - 134, done on arrival.

## 2014-01-17 NOTE — Telephone Encounter (Signed)
Patient Information:  Caller Name: Charis  Phone: [phone]  Patient: Crystal [name]  Gender: Female  DOB: [birth year]  Age: 63 Years  PCP: Penni Homans [facility])  Office Follow Up:  Does the office need to follow up with this patient?: Yes  Instructions For The Office: She added Ginko, OTC to her regime and today, blood sugars dropping fast and needed to call Paramedics (Last BS after breakfast was 70 and shakey).  RN Note:  Afebrile. Onset 01/13/2014 added ginko OTC to improve memory and this am FBS 105 but after breakfast out with friends (ordered two scrambeled egges, two pancakes with syrup and two strips of bacon which would usually bring her BS to the 160's. BS is now 92 and getting lower, she is drinking orange juice to try and help but very shakey. As RN/CAN beginnng to triage Paramedics arrived, her boss was concerned and called them.  Symptoms  Reason For Call & Symptoms: hypoglycemia  Reviewed Health History In EMR: No  Reviewed Medications In EMR: No  Reviewed Allergies In EMR: No  Reviewed Surgeries / Procedures: No  Date of Onset of Symptoms: 01/17/2014  Treatments Tried: orange juice  Treatments Tried Worked: No  Guideline(s) Used:  No Protocol Available - Sick Adult  Disposition Per Guideline:   Discuss with PCP and Callback by Nurse Today  Reason For Disposition Reached:   Nursing judgment  Advice Given:  N/A  Patient Will Follow Care Advice:  YES

## 2014-01-17 NOTE — Discharge Instructions (Signed)
Your diabetes may be under too tight control. Suture only on pills and had her pill today. Prior to need to snack frequently. Call your blood sugars carefully. Discuss with your record Dr. It may be time to stop medications altogether and just have you work on diet for control of blood sugars. Return for any newer worse symptoms.

## 2014-01-17 NOTE — ED Provider Notes (Signed)
CSN: [id number]     Arrival date & time 01/17/14  0935 History   First MD Initiated Contact with Patient 01/17/14 1014     Chief Complaint  Patient presents with  . Hypoglycemia     (Consider location/radiation/quality/duration/timing/severity/associated sxs/prior Treatment) Patient is a 63 y.o. female presenting with hypoglycemia. The history is provided by the patient.  Hypoglycemia Associated symptoms: no shortness of breath    patient with long-standing diabetes. Patient primary care doctors been noting that her blood sugars have been under better control of late has been reducing her medicines. This morning the patient had upper blood sugar was 105. She took her medication that she only takes just in the morning. She ate her breakfast but only went up to 110 and then she felt bad rescue squad noted that was 74. No chest pain no syncope just fatigue and feeling jittery. Here patient feels better.  Past Medical History  Diagnosis Date  . Hypertension   . Hyperlipemia   . Dyspepsia   . OSA (obstructive sleep apnea)   . Plantar fasciitis   . Depression   . Asthma     02/08/10 FEV1 1.98 (80%), s/p saba 2.22 l/m (90%).nml  . Anxiety   . 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 Fatty liver 12/19/2010  . Internal hemorrhoids   . Diverticulosis   . Allergy     dust, cock roach,grass,weeds,molds  . GERD (gastroesophageal reflux disease)   . Acute peptic ulcer of stomach     As a teenager  . IBS (irritable bowel syndrome)   . Cataract    Past Surgical History  Procedure Laterality Date  . Knee arthroscopy Left 1998  . Toe surgery Right 1995  . Nasal septum surgery    . Rhinoplasty    . Appendectomy      age 47  . Colonoscopy  06/03/2010, 08/25/2011    diverticulosis   . Gastrectomy      partial - ulcer as a teen   Family History  Problem Relation Age of Onset  . Diabetes Mother     type II  . Heart attack Mother 58  . Ulcers      bleeding gastric  . Colon cancer Sister    . Heart defect      child  . Other      perforated bowel, child   History  Substance Use Topics  . Smoking status: Never Smoker   . Smokeless tobacco: Never Used  . Alcohol Use: No   OB History   Grav Para Term Preterm Abortions TAB SAB Ect Mult Living   4 4             Obstetric Comments   1 child died at 53 weeks (heart defects, perforated bowel)     Review of Systems  Constitutional: Positive for fatigue. Negative for fever.  HENT: Negative for congestion.   Eyes: Negative for visual disturbance.  Respiratory: Negative for shortness of breath.   Cardiovascular: Negative for chest pain.  Gastrointestinal: Negative for abdominal pain.  Genitourinary: Negative for dysuria.  Musculoskeletal: Negative for back pain.  Skin: Negative for rash.  Neurological: Positive for light-headedness. Negative for headaches.  Hematological: Does not bruise/bleed easily.  Psychiatric/Behavioral: Negative for confusion.      Allergies  Compazine; Prochlorperazine edisylate; and Tetanus toxoid  Home Medications   Prior to Admission medications   Medication Sig Start Date End Date Taking? Authorizing Provider  ACCU-CHEK AVIVA PLUS test strip  TEST BLOOD SUGARS 1 TO 2 TIMES A DAY 01/09/14   [name], MD  ACCU-CHEK FASTCLIX LANCETS MISC USE TO CHECK BLOOD GLUCOSE TWICE DAILY 11/18/13   [name], MD  albuterol (PROAIR HFA) 108 (90 BASE) MCG/ACT inhaler Inhale 2 puffs into the lungs every 4 (four) hours as needed. Shortness of breath and wheezing 04/25/13   Debbrah Alar, NP  alosetron (LOTRONEX) 1 MG tablet Take 1 tablet (1 mg total) by mouth 2 (two) times daily. And as directed 08/16/13   Gatha Mayer, MD  aspirin 81 MG tablet Take 81 mg by mouth daily.    Historical Provider, MD  atorvastatin (LIPITOR) 80 MG tablet Take 1 tablet (80 mg total) by mouth daily. 10/11/13   [name], MD  Blood Glucose Monitoring Suppl (ACCU-CHEK AVIVA PLUS) W/DEVICE KIT 1 kit by Does not  apply route daily. DX 250.00 08/06/12   Debbrah Alar, NP  clotrimazole-betamethasone (LOTRISONE) cream  09/01/13   Historical Provider, MD  escitalopram (LEXAPRO) 10 MG tablet Take 1 tablet (10 mg total) by mouth daily. 10/11/13   [name], MD  fenofibrate 160 MG tablet Take 160 mg by mouth at bedtime.  09/12/13   Historical Provider, MD  fluticasone Asencion Islam) 50 MCG/ACT nasal spray  10/10/13   Historical Provider, MD  glimepiride (AMARYL) 1 MG tablet Take 1 tablet (1 mg total) by mouth daily with breakfast. 11/22/13   Debbrah Alar, NP  INVOKANA 100 MG TABS TAKE 1 TABLET (100 MG TOTAL) BY MOUTH DAILY. 01/17/14   [name], MD  JANUVIA 100 MG tablet Take 100 mg by mouth daily.  09/12/13   Historical Provider, MD  lisinopril (PRINIVIL,ZESTRIL) 2.5 MG tablet TAKE 1 TABLET BY MOUTH ONCE DAILY 01/17/14   [name], MD  mometasone (NASONEX) 50 MCG/ACT nasal spray Place 2 sprays into the nose daily as needed. 10/11/13   [name], MD  Multiple Vitamins-Minerals (CENTRUM SILVER PO) Take 1 tablet by mouth daily.      Historical Provider, MD  nitroGLYCERIN (NITRODUR - DOSED IN MG/24 HR) 0.2 mg/hr patch  10/31/13   Historical Provider, MD  ondansetron (ZOFRAN) 4 MG tablet Take 1 tablet (4 mg total) by mouth every 8 (eight) hours as needed for nausea. 06/24/12   Gatha Mayer, MD  pantoprazole (PROTONIX) 40 MG tablet Take 1 tablet (40 mg total) by mouth daily. 10/11/13   [name], MD  Probiotic Product (ALIGN) 4 MG CAPS Take 1 capsule by mouth daily.      Historical Provider, MD  sertraline (ZOLOFT) 50 MG tablet Take 50 mg by mouth daily.  10/17/13   Historical Provider, MD  valACYclovir (VALTREX) 500 MG tablet Take 1 tablet (500 mg total) by mouth daily. 10/11/13   [name], MD   BP 156/88  Pulse 84  Temp(Src) 98.4 F (36.9 C) (Oral)  Resp 16  Ht 5' 3.5" (1.613 m)  Wt 149 lb (67.586 kg)  BMI 25.98 kg/m2  SpO2 98% Physical Exam  Nursing note and vitals  reviewed. Constitutional: She is oriented to person, place, and time. She appears well-developed and well-nourished. No distress.  HENT:  Head: Normocephalic and atraumatic.  Mouth/Throat: Oropharynx is clear and moist.  Eyes: Conjunctivae and EOM are normal. Pupils are equal, round, and reactive to light.  Neck: Normal range of motion.  Cardiovascular: Normal rate, regular rhythm and normal heart sounds.   No murmur heard. Pulmonary/Chest: Effort normal and breath sounds  normal. No respiratory distress.  Abdominal: Bowel sounds are normal. There is no tenderness.  Musculoskeletal: Normal range of motion. She exhibits no edema.  Neurological: She is alert and oriented to person, place, and time. No cranial nerve deficit. She exhibits normal muscle tone. Coordination normal.  Skin: Skin is warm. No rash noted.    ED Course  Procedures (including critical care time) Labs Review Labs Reviewed  BASIC METABOLIC PANEL - Abnormal; Notable for the following:    GFR calc non Af Amer 90 (*)    All other components within normal limits  CBG MONITORING, ED - Abnormal; Notable for the following:    Glucose-Capillary 133 (*)    All other components within normal limits  CBC WITH DIFFERENTIAL  CBG MONITORING, ED  CBG MONITORING, ED   Results for orders placed during the hospital encounter of 33/29/51  BASIC METABOLIC PANEL      Result Value Ref Range   Sodium 143  137 - 147 mEq/L   Potassium 4.7  3.7 - 5.3 mEq/L   Chloride 105  96 - 112 mEq/L   CO2 23  19 - 32 mEq/L   Glucose, Bld 91  70 - 99 mg/dL   BUN 13  6 - 23 mg/dL   Creatinine, Ser 0.70  0.50 - 1.10 mg/dL   Calcium 9.4  8.4 - 10.5 mg/dL   GFR calc non Af Amer 90 (*) >90 mL/min   GFR calc Af Amer >90  >90 mL/min   Anion gap 15  5 - 15  CBC WITH DIFFERENTIAL      Result Value Ref Range   WBC 7.0  4.0 - 10.5 K/uL   RBC 4.49  3.87 - 5.11 MIL/uL   Hemoglobin 14.1  12.0 - 15.0 g/dL   HCT 42.2  36.0 - 46.0 %   MCV 94.0  78.0 -  100.0 fL   MCH 31.4  26.0 - 34.0 pg   MCHC 33.4  30.0 - 36.0 g/dL   RDW 12.4  11.5 - 15.5 %   Platelets 245  150 - 400 K/uL   Neutrophils Relative % 55  43 - 77 %   Neutro Abs 3.9  1.7 - 7.7 K/uL   Lymphocytes Relative 35  12 - 46 %   Lymphs Abs 2.5  0.7 - 4.0 K/uL   Monocytes Relative 9  3 - 12 %   Monocytes Absolute 0.6  0.1 - 1.0 K/uL   Eosinophils Relative 1  0 - 5 %   Eosinophils Absolute 0.1  0.0 - 0.7 K/uL   Basophils Relative 0  0 - 1 %   Basophils Absolute 0.0  0.0 - 0.1 K/uL  CBG MONITORING, ED      Result Value Ref Range   Glucose-Capillary 86  70 - 99 mg/dL   Comment 1 Notify RN    CBG MONITORING, ED      Result Value Ref Range   Glucose-Capillary 133 (*) 70 - 99 mg/dL   Comment 1 Notify RN     Comment 2 Documented in Chart     Comment 3 Orig Pt Id entered as [id number]       Imaging Review No results found.   EKG Interpretation None      MDM   Final diagnoses:  Hypoglycemia   Patient had a snack here. Blood sugar) at discharge fingerstick was 101. Patient asymptomatic. Patient's blood sugars probably too tightly controlled on oral hypoglycemics. She'll contact her doctors afternoon. Nehemiah Settle  down the lowest dose possible they've been thinking about maybe stopping it. Patient will snack freely throughout the day. She knows very well how to maintain her blood sugars and how to follow them with her fingersticks. Patient will return for a newer worse symptoms. Labs otherwise without any significant abnormalities.    Fredia Sorrow, MD 01/17/14 1252

## 2014-01-17 NOTE — ED Notes (Signed)
Hypoglycemia  States Fasting Glucose 105, ate breakfast, 0800 CBG 110.  States she felt bad, CBG 74.  Drank OJ and called EMS

## 2014-01-17 NOTE — Telephone Encounter (Signed)
Have her hold the Amaryl for a couple weeks and let us know what her sugars look like

## 2014-01-17 NOTE — Telephone Encounter (Signed)
Patient called in stating that she wanted Dr. Charlett Blake to know that she was downstairs in the ED regarding this.

## 2014-01-17 NOTE — Telephone Encounter (Signed)
Left a message for patient to return my call. 

## 2014-01-17 NOTE — ED Notes (Signed)
Pt given crackers, peanut butter and diet coke at this time.

## 2014-01-17 NOTE — Telephone Encounter (Signed)
Pt states the MD in the ER stated that she needs to come off of all of her BS medications?  Is this just the amaryl or do you want her off of all BS meds?

## 2014-01-17 NOTE — Telephone Encounter (Signed)
FYI

## 2014-01-17 NOTE — Telephone Encounter (Signed)
Try taking every other day for a couple weeks then if fatigue lifts go back to every day. If after 2 more weeks no improvement then we will have to switch meds

## 2014-01-31 ENCOUNTER — Telehealth: Payer: Self-pay | Admitting: Family Medicine

## 2014-01-31 DIAGNOSIS — F419 Anxiety disorder, unspecified: Secondary | ICD-10-CM

## 2014-01-31 DIAGNOSIS — A609 Anogenital herpesviral infection, unspecified: Secondary | ICD-10-CM

## 2014-01-31 DIAGNOSIS — E785 Hyperlipidemia, unspecified: Secondary | ICD-10-CM

## 2014-01-31 DIAGNOSIS — F329 Major depressive disorder, single episode, unspecified: Secondary | ICD-10-CM

## 2014-01-31 NOTE — Telephone Encounter (Signed)
Patient is retiring 1 two weeks and wont be 70 until October.  She will be without insurance.  She would like a 3 month supply of all her meds while she still has insurance from work.  [facility] in [place]

## 2014-01-31 NOTE — Telephone Encounter (Signed)
Please advise 

## 2014-02-02 NOTE — Telephone Encounter (Signed)
Patient calling back regarding this.  °

## 2014-02-03 ENCOUNTER — Telehealth: Payer: Self-pay | Admitting: Family Medicine

## 2014-02-03 MED ORDER — ALBUTEROL SULFATE HFA 108 (90 BASE) MCG/ACT IN AERS: 2.0000 | INHALATION_SPRAY | RESPIRATORY_TRACT | Status: DC | PRN

## 2014-02-03 MED ORDER — FENOFIBRATE 160 MG PO TABS: 160.0000 mg | ORAL_TABLET | Freq: Every day | ORAL | Status: DC

## 2014-02-03 MED ORDER — SERTRALINE HCL 50 MG PO TABS: 50.0000 mg | ORAL_TABLET | Freq: Every day | ORAL | Status: DC

## 2014-02-03 MED ORDER — CANAGLIFLOZIN 100 MG PO TABS: 100.0000 mg | ORAL_TABLET | Freq: Every day | ORAL | Status: DC

## 2014-02-03 MED ORDER — VALACYCLOVIR HCL 500 MG PO TABS: 500.0000 mg | ORAL_TABLET | Freq: Every day | ORAL | Status: DC

## 2014-02-03 MED ORDER — ESCITALOPRAM OXALATE 10 MG PO TABS: 10.0000 mg | ORAL_TABLET | Freq: Every day | ORAL | Status: DC

## 2014-02-03 MED ORDER — ACCU-CHEK FASTCLIX LANCETS MISC: Status: DC

## 2014-02-03 MED ORDER — SITAGLIPTIN PHOSPHATE 100 MG PO TABS: 100.0000 mg | ORAL_TABLET | Freq: Every day | ORAL | Status: DC

## 2014-02-03 MED ORDER — FLUTICASONE PROPIONATE 50 MCG/ACT NA SUSP: 1.0000 | Freq: Every day | NASAL | Status: DC

## 2014-02-03 MED ORDER — ALOSETRON HCL 1 MG PO TABS: 1.0000 mg | ORAL_TABLET | Freq: Two times a day (BID) | ORAL | Status: DC

## 2014-02-03 MED ORDER — GLUCOSE BLOOD VI STRP: ORAL_STRIP | Status: DC

## 2014-02-03 MED ORDER — ATORVASTATIN CALCIUM 80 MG PO TABS: 80.0000 mg | ORAL_TABLET | Freq: Every day | ORAL | Status: DC

## 2014-02-03 MED ORDER — MOMETASONE FUROATE 50 MCG/ACT NA SUSP: 2.0000 | Freq: Every day | NASAL | Status: DC | PRN

## 2014-02-03 MED ORDER — GLIMEPIRIDE 1 MG PO TABS: 1.0000 mg | ORAL_TABLET | Freq: Every day | ORAL | Status: DC

## 2014-02-03 MED ORDER — LISINOPRIL 2.5 MG PO TABS: 2.5000 mg | ORAL_TABLET | Freq: Every day | ORAL | Status: DC

## 2014-02-03 MED ORDER — PANTOPRAZOLE SODIUM 40 MG PO TBEC: 40.0000 mg | DELAYED_RELEASE_TABLET | Freq: Every day | ORAL | Status: DC

## 2014-02-03 NOTE — Telephone Encounter (Signed)
Please see previous note. Patient is requesting a refill of test strips. Lauralyn Primes states that patient will be there to pick up rx's at 2:30

## 2014-02-03 NOTE — Telephone Encounter (Signed)
Per Lauralyn Primes at [facility], they received all other rxs but did not get test strips.  Rx sent.

## 2014-02-06 ENCOUNTER — Telehealth: Payer: Self-pay

## 2014-02-06 NOTE — Telephone Encounter (Signed)
[name] with the pharmacy called and left a message stating they need the hard copy of Lotronex and they have to make MD aware of the high interaction with this medication and Zoloft?  Please advise?

## 2014-02-06 NOTE — Telephone Encounter (Signed)
Per monica- the Lotronex and Zoloft have Serotonin and this can cause Serotonin Syndrome.  Crystal [name] would also like to know if MD is registered w/ Lotronex?

## 2014-02-06 NOTE — Telephone Encounter (Signed)
I ran a interaction check in my system medical app and did not find an interaction, please check with pharmacy about interaction that popped up for them

## 2014-02-07 NOTE — Telephone Encounter (Signed)
So notify patient Lotronex cannot be prescribed by PMD due to needs special registration notify we have been notified it interacts with Sertraline, we could switch her to Wellbutrin if she is willing

## 2014-02-07 NOTE — Telephone Encounter (Signed)
I informed patient and she states Dr Carlean Purl prescribes the Lotronex and she will contact him.Crystal [name] at [facility] informed to disregard this RX from Dr Charlett Blake. Ben voiced understanding

## 2014-04-13 ENCOUNTER — Ambulatory Visit (INDEPENDENT_AMBULATORY_CARE_PROVIDER_SITE_OTHER): Payer: Medicare Other

## 2014-04-13 VITALS — BP 123/83

## 2014-04-13 DIAGNOSIS — E1140 Type 2 diabetes mellitus with diabetic neuropathy, unspecified: Secondary | ICD-10-CM

## 2014-04-13 DIAGNOSIS — Z23 Encounter for immunization: Secondary | ICD-10-CM

## 2014-04-13 NOTE — Progress Notes (Signed)
Pt tolerated injection well.  No signs of reaction upon leaving the clinic.   

## 2014-04-13 NOTE — Progress Notes (Signed)
Pre visit review using our clinic review tool, if applicable. No additional management support is needed unless otherwise documented below in the visit note. 

## 2014-04-18 ENCOUNTER — Telehealth: Payer: Self-pay | Admitting: Internal Medicine

## 2014-04-18 MED ORDER — ALOSETRON HCL 1 MG PO TABS: 1.0000 mg | ORAL_TABLET | Freq: Two times a day (BID) | ORAL | Status: DC

## 2014-04-18 NOTE — Telephone Encounter (Signed)
Informed patient that rx will be refilled.  Confirmed her address and will mail to her because it needs a sticker on the rx.

## 2014-04-18 NOTE — Telephone Encounter (Signed)
Ok to refill Sir?  Thank you for your time.

## 2014-04-18 NOTE — Telephone Encounter (Signed)
Refill x 1 year 

## 2014-04-24 ENCOUNTER — Telehealth: Payer: Self-pay | Admitting: Internal Medicine

## 2014-04-25 MED ORDER — DICYCLOMINE HCL 20 MG PO TABS: 20.0000 mg | ORAL_TABLET | Freq: Four times a day (QID) | ORAL | Status: DC | PRN

## 2014-04-25 NOTE — Telephone Encounter (Signed)
I have spoken to patient and have advised of Dr Celesta Aver recommendations. She verbalizes understanding and will try the dicyclomine in place of Lotronex. New rx sent to pharmacy.

## 2014-04-25 NOTE — Telephone Encounter (Signed)
There is nothing like Lotronex - it is 1 of a kind  She can try dicyclomine 20 mg every 6 hrs as needed for cramps and diarrhea RF # 5

## 2014-04-25 NOTE — Telephone Encounter (Signed)
Dr. Carlean Purl,  Patient is unable to pay for Lotronex Is there something else she can take? Please advise.

## 2014-04-25 NOTE — Telephone Encounter (Signed)
Left message for patient to call back  

## 2014-05-08 ENCOUNTER — Other Ambulatory Visit: Payer: Self-pay | Admitting: Family Medicine

## 2014-05-11 ENCOUNTER — Encounter: Payer: Self-pay | Admitting: Family Medicine

## 2014-05-11 ENCOUNTER — Ambulatory Visit (INDEPENDENT_AMBULATORY_CARE_PROVIDER_SITE_OTHER): Payer: Medicare HMO | Admitting: Family Medicine

## 2014-05-11 VITALS — BP 125/85 | HR 85 | Ht 63.0 in | Wt 153.0 lb

## 2014-05-11 DIAGNOSIS — M19049 Primary osteoarthritis, unspecified hand: Secondary | ICD-10-CM

## 2014-05-11 DIAGNOSIS — M1990 Unspecified osteoarthritis, unspecified site: Secondary | ICD-10-CM

## 2014-05-11 MED ORDER — METHYLPREDNISOLONE ACETATE 40 MG/ML IJ SUSP
40.0000 mg | Freq: Once | INTRAMUSCULAR | Status: AC
  Administered 2014-05-11: 20 mg via INTRA_ARTICULAR

## 2014-05-15 ENCOUNTER — Other Ambulatory Visit: Payer: Self-pay | Admitting: Family Medicine

## 2014-05-15 ENCOUNTER — Encounter: Payer: Self-pay | Admitting: Family Medicine

## 2014-05-17 NOTE — Assessment & Plan Note (Signed)
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  Encouraged bracing.  Tylenol, nsaids, glucosamine, capsaicin as discussed previously.  F/u prn.  After informed written consent patient was seated in chair in exam room.  Ultrasound used to identify joint space.  Alcohol swab used for prep then right 1st CMC injected with 0.5:0.5 marcaine: depomedrol.  Patient tolerated procedure well without immediate complications.

## 2014-05-17 NOTE — Progress Notes (Signed)
Patient ID: Crystal [name], female   DOB: [birth year], 63 y.o.   MRN: [id number]  Subjective:    Patient ID: Crystal [name], female    DOB: 05/03/1951, 63 y.o.   MRN: [id number]  PCP: Debbrah Alar  Hand Pain    63 yo F here for f/u bilateral thumb pain.  4/3: Patient reports for about a year she has had pain at base of her thumbs. Has really intensified past few months, now very difficult to function. Pain radiates into thumb past MCP and into index finger. No numbness/tingling. Taking tylenol and using activeon. Works at a computer 8 hours a day, lots of typing. No prior treatment for this.  Not bracing.  5/6: Patient returns stating right thumb is much improved after injection last visit. Here for similar shot in left thumb as this one has worsened.  03/04/13: Patient returns as left and right thumb bases bothering her again. Has brace only for right but not currently using. Injections in both sides have lasted until past few weeks. Not icing or taking any medicines now.  10/25/13: Patient returns for right thumb pain primarily - would like repeat injection. Left side is doing ok. Has been using braces. Not taking any medicines regularly for pain. Continues with high dexterity job.  10/30: Patient is now retired and thumbs have been improved. However in past couple weeks right CMC joint acting up again. Increased swelling, tender to touch. Would like repeat injection. Has braces - using occasionally.  Past Medical History  Diagnosis Date  . Hypertension   . Hyperlipemia   . Dyspepsia   . OSA (obstructive sleep apnea)   . Plantar fasciitis   . Depression   . Asthma     02/08/10 FEV1 1.98 (80%), s/p saba 2.22 l/m (90%).nml  . Anxiety   . 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 Fatty liver 12/19/2010  . Internal hemorrhoids   . Diverticulosis   . Allergy     dust, cock roach,grass,weeds,molds  . GERD (gastroesophageal reflux disease)   . Acute peptic  ulcer of stomach     As a teenager  . IBS (irritable bowel syndrome)   . Cataract     Current Outpatient Prescriptions on File Prior to Visit  Medication Sig Dispense Refill  . ACCU-CHEK FASTCLIX LANCETS MISC Check Blood Glucose bid  DX 250.81 102 each 1  . albuterol (PROAIR HFA) 108 (90 BASE) MCG/ACT inhaler Inhale 2 puffs into the lungs every 4 (four) hours as needed. Shortness of breath and wheezing 18 g 0  . alosetron (LOTRONEX) 1 MG tablet Take 1 tablet (1 mg total) by mouth 2 (two) times daily. And as directed 180 tablet 3  . aspirin 81 MG tablet Take 81 mg by mouth daily.    . Blood Glucose Monitoring Suppl (ACCU-CHEK AVIVA PLUS) W/DEVICE KIT 1 kit by Does not apply route daily. DX 250.00 1 kit 0  . clotrimazole-betamethasone (LOTRISONE) cream     . dicyclomine (BENTYL) 20 MG tablet Take 1 tablet (20 mg total) by mouth every 6 (six) hours as needed (cramps/diarrhea). 40 tablet 5  . escitalopram (LEXAPRO) 10 MG tablet TAKE 1 TABLET (10 MG TOTAL) BY MOUTH DAILY. 90 tablet 1  . fenofibrate 160 MG tablet Take 1 tablet (160 mg total) by mouth at bedtime. 90 tablet 0  . fluticasone (FLONASE) 50 MCG/ACT nasal spray Place 1 spray into both nostrils daily. 16 g 0  . glimepiride (AMARYL) 1 MG tablet  TAKE 1 TABLET (1 MG TOTAL) BY MOUTH DAILY WITH BREAKFAST. 90 tablet 1  . glucose blood (ACCU-CHEK AVIVA PLUS) test strip TEST BLOOD SUGARS 1 TO 2 TIMES A DAY. Dx 250.00 180 each 1  . INVOKANA 100 MG TABS TAKE 1 TABLET (100 MG TOTAL) BY MOUTH DAILY. 90 tablet 1  . lisinopril (PRINIVIL,ZESTRIL) 2.5 MG tablet TAKE 1 TABLET (2.5 MG TOTAL) BY MOUTH DAILY. 90 tablet 1  . mometasone (NASONEX) 50 MCG/ACT nasal spray Place 2 sprays into the nose daily as needed. 17 g 6  . Multiple Vitamins-Minerals (CENTRUM SILVER PO) Take 1 tablet by mouth daily.      . nitroGLYCERIN (NITRODUR - DOSED IN MG/24 HR) 0.2 mg/hr patch     . ondansetron (ZOFRAN) 4 MG tablet Take 1 tablet (4 mg total) by mouth every 8 (eight)  hours as needed for nausea. 60 tablet 1  . pantoprazole (PROTONIX) 40 MG tablet TAKE 1 TABLET (40 MG TOTAL) BY MOUTH DAILY. 90 tablet 1  . Probiotic Product (ALIGN) 4 MG CAPS Take 1 capsule by mouth daily.      . sertraline (ZOLOFT) 50 MG tablet Take 1 tablet (50 mg total) by mouth daily. 90 tablet 0  . sitaGLIPtin (JANUVIA) 100 MG tablet Take 1 tablet (100 mg total) by mouth daily. 90 tablet 0   No current facility-administered medications on file prior to visit.    Past Surgical History  Procedure Laterality Date  . Knee arthroscopy Left 1998  . Toe surgery Right 1995  . Nasal septum surgery    . Rhinoplasty    . Appendectomy      age 1  . Colonoscopy  06/03/2010, 08/25/2011    diverticulosis   . Gastrectomy      partial - ulcer as a teen    Allergies  Allergen Reactions  . Compazine [Prochlorperazine Edisylate]   . Prochlorperazine Edisylate Other (See Comments)    coma for 3 days  . Tetanus Toxoid Other (See Comments)    convulsions    History   Social History  . Marital Status: Married    Spouse Name: [name]    Number of Children: 4  . 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 Smoking status: Never Smoker   . Smokeless tobacco: Never Used  . Alcohol Use: No  . Drug Use: No  . Sexual Activity: Not on file   Other Topics Concern  . Not on file   Social History Narrative   No caffeine drinks daily     Family History  Problem Relation Age of Onset  . Diabetes Mother     type II  . Heart attack Mother 69  . Ulcers      bleeding gastric  . Colon cancer Sister   . Heart defect      child  . Other      perforated bowel, child    BP 125/85 mmHg  Pulse 85  Ht '5\' 3"'  (1.6 m)  Wt 153 lb (69.4 kg)  BMI 27.11 kg/m2  Review of Systems See HPI above.    Objective:   Physical Exam Gen: NAD  R hand: No gross deformity, swelling, bruising. Mod TTP 1st CMC joint.  No 1st dorsal compartment or  carpal tunnel tenderness.  No other hand, wrist TTP. Decreased ROM all planes of 1st CMC.  FROM MCP and IP of thumb. Collateral ligaments intact. Negative finkelsteins. NVI  distally.    Assessment & Plan:  1. 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  Encouraged bracing.  Tylenol, nsaids, glucosamine, capsaicin as discussed previously.  F/u prn.  After informed written consent patient was seated in chair in exam room.  Ultrasound used to identify joint space.  Alcohol swab used for prep then right 1st CMC injected with 0.5:0.5 marcaine: depomedrol.  Patient tolerated procedure well without immediate complications.

## 2014-06-07 ENCOUNTER — Ambulatory Visit: Payer: Medicare HMO | Admitting: Family Medicine

## 2014-06-19 ENCOUNTER — Encounter: Payer: Self-pay | Admitting: Family Medicine

## 2014-06-19 ENCOUNTER — Ambulatory Visit (INDEPENDENT_AMBULATORY_CARE_PROVIDER_SITE_OTHER): Payer: Medicare HMO | Admitting: Family Medicine

## 2014-06-19 ENCOUNTER — Other Ambulatory Visit: Payer: Self-pay | Admitting: Family Medicine

## 2014-06-19 VITALS — BP 129/85 | HR 81 | Temp 97.4°F | Ht 63.0 in | Wt 156.4 lb

## 2014-06-19 DIAGNOSIS — E669 Obesity, unspecified: Secondary | ICD-10-CM

## 2014-06-19 DIAGNOSIS — F341 Dysthymic disorder: Secondary | ICD-10-CM

## 2014-06-19 DIAGNOSIS — E119 Type 2 diabetes mellitus without complications: Secondary | ICD-10-CM

## 2014-06-19 DIAGNOSIS — M79601 Pain in right arm: Secondary | ICD-10-CM

## 2014-06-19 DIAGNOSIS — K219 Gastro-esophageal reflux disease without esophagitis: Secondary | ICD-10-CM

## 2014-06-19 DIAGNOSIS — Z0000 Encounter for general adult medical examination without abnormal findings: Secondary | ICD-10-CM

## 2014-06-19 DIAGNOSIS — E1169 Type 2 diabetes mellitus with other specified complication: Secondary | ICD-10-CM

## 2014-06-19 DIAGNOSIS — E1149 Type 2 diabetes mellitus with other diabetic neurological complication: Secondary | ICD-10-CM

## 2014-06-19 DIAGNOSIS — I10 Essential (primary) hypertension: Secondary | ICD-10-CM

## 2014-06-19 DIAGNOSIS — E785 Hyperlipidemia, unspecified: Secondary | ICD-10-CM

## 2014-06-19 DIAGNOSIS — E039 Hypothyroidism, unspecified: Secondary | ICD-10-CM

## 2014-06-19 DIAGNOSIS — M25579 Pain in unspecified ankle and joints of unspecified foot: Secondary | ICD-10-CM

## 2014-06-19 DIAGNOSIS — E038 Other specified hypothyroidism: Secondary | ICD-10-CM

## 2014-06-19 MED ORDER — VALACYCLOVIR HCL 1 G PO TABS: 1000.0000 mg | ORAL_TABLET | Freq: Two times a day (BID) | ORAL | Status: DC

## 2014-06-19 MED ORDER — SERTRALINE HCL 50 MG PO TABS: 50.0000 mg | ORAL_TABLET | Freq: Every day | ORAL | Status: DC

## 2014-06-19 NOTE — Patient Instructions (Signed)
Preventive Care for Adults A healthy lifestyle and preventive care can promote health and wellness. Preventive health guidelines for women include the following key practices.  A routine yearly physical is a good way to check with your health care provider about your health and preventive screening. It is a chance to share any concerns and updates on your health and to receive a thorough exam.  Visit your dentist for a routine exam and preventive care every 6 months. Brush your teeth twice a day and floss once a day. Good oral hygiene prevents tooth decay and gum disease.  The frequency of eye exams is based on your age, health, family medical history, use of contact lenses, and other factors. Follow your health care provider's recommendations for frequency of eye exams.  Eat a healthy diet. Foods like vegetables, fruits, whole grains, low-fat dairy products, and lean protein foods contain the nutrients you need without too many calories. Decrease your intake of foods high in solid fats, added sugars, and salt. Eat the right amount of calories for you.Get information about a proper diet from your health care provider, if necessary.  Regular physical exercise is one of the most important things you can do for your health. Most adults should get at least 150 minutes of moderate-intensity exercise (any activity that increases your heart rate and causes you to sweat) each week. In addition, most adults need muscle-strengthening exercises on 2 or more days a week.  Maintain a healthy weight. The body mass index (BMI) is a screening tool to identify possible weight problems. It provides an estimate of body fat based on height and weight. Your health care provider can find your BMI and can help you achieve or maintain a healthy weight.For adults 20 years and older:  A BMI below 18.5 is considered underweight.  A BMI of 18.5 to 24.9 is normal.  A BMI of 25 to 29.9 is considered overweight.  A BMI of  30 and above is considered obese.  Maintain normal blood lipids and cholesterol levels by exercising and minimizing your intake of saturated fat. Eat a balanced diet with plenty of fruit and vegetables. Blood tests for lipids and cholesterol should begin at age 76 and be repeated every 5 years. If your lipid or cholesterol levels are high, you are over 50, or you are at high risk for heart disease, you may need your cholesterol levels checked more frequently.Ongoing high lipid and cholesterol levels should be treated with medicines if diet and exercise are not working.  If you smoke, find out from your health care provider how to quit. If you do not use tobacco, do not start.  Lung cancer screening is recommended for adults aged 22-80 years who are at high risk for developing lung cancer because of a history of smoking. A yearly low-dose CT scan of the lungs is recommended for people who have at least a 30-pack-year history of smoking and are a current smoker or have quit within the past 15 years. A pack year of smoking is smoking an average of 1 pack of cigarettes a day for 1 year (for example: 1 pack a day for 30 years or 2 packs a day for 15 years). Yearly screening should continue until the smoker has stopped smoking for at least 15 years. Yearly screening should be stopped for people who develop a health problem that would prevent them from having lung cancer treatment.  If you are pregnant, do not drink alcohol. If you are breastfeeding,  be very cautious about drinking alcohol. If you are not pregnant and choose to drink alcohol, do not have more than 1 drink per day. One drink is considered to be 12 ounces (355 mL) of beer, 5 ounces (148 mL) of wine, or 1.5 ounces (44 mL) of liquor.  Avoid use of street drugs. Do not share needles with anyone. Ask for help if you need support or instructions about stopping the use of drugs.  High blood pressure causes heart disease and increases the risk of  stroke. Your blood pressure should be checked at least every 1 to 2 years. Ongoing high blood pressure should be treated with medicines if weight loss and exercise do not work.  If you are 3-86 years old, ask your health care provider if you should take aspirin to prevent strokes.  Diabetes screening involves taking a blood sample to check your fasting blood sugar level. This should be done once every 3 years, after age 67, if you are within normal weight and without risk factors for diabetes. Testing should be considered at a younger age or be carried out more frequently if you are overweight and have at least 1 risk factor for diabetes.  Breast cancer screening is essential preventive care for women. You should practice "breast self-awareness." This means understanding the normal appearance and feel of your breasts and may include breast self-examination. Any changes detected, no matter how small, should be reported to a health care provider. Women in their 8s and 30s should have a clinical breast exam (CBE) by a health care provider as part of a regular health exam every 1 to 3 years. After age 70, women should have a CBE every year. Starting at age 25, women should consider having a mammogram (breast X-ray test) every year. Women who have a family history of breast cancer should talk to their health care provider about genetic screening. Women at a high risk of breast cancer should talk to their health care providers about having an MRI and a mammogram every year.  Breast cancer gene (BRCA)-related cancer risk assessment is recommended for women who have family members with BRCA-related cancers. BRCA-related cancers include breast, ovarian, tubal, and peritoneal cancers. Having family members with these cancers may be associated with an increased risk for harmful changes (mutations) in the breast cancer genes BRCA1 and BRCA2. Results of the assessment will determine the need for genetic counseling and  BRCA1 and BRCA2 testing.  Routine pelvic exams to screen for cancer are no longer recommended for nonpregnant women who are considered low risk for cancer of the pelvic organs (ovaries, uterus, and vagina) and who do not have symptoms. Ask your health care provider if a screening pelvic exam is right for you.  If you have had past treatment for cervical cancer or a condition that could lead to cancer, you need Pap tests and screening for cancer for at least 20 years after your treatment. If Pap tests have been discontinued, your risk factors (such as having a new sexual partner) need to be reassessed to determine if screening should be resumed. Some women have medical problems that increase the chance of getting cervical cancer. In these cases, your health care provider may recommend more frequent screening and Pap tests.  The HPV test is an additional test that may be used for cervical cancer screening. The HPV test looks for the virus that can cause the cell changes on the cervix. The cells collected during the Pap test can be  tested for HPV. The HPV test could be used to screen women aged 30 years and older, and should be used in women of any age who have unclear Pap test results. After the age of 30, women should have HPV testing at the same frequency as a Pap test.  Colorectal cancer can be detected and often prevented. Most routine colorectal cancer screening begins at the age of 50 years and continues through age 75 years. However, your health care provider may recommend screening at an earlier age if you have risk factors for colon cancer. On a yearly basis, your health care provider may provide home test kits to check for hidden blood in the stool. Use of a small camera at the end of a tube, to directly examine the colon (sigmoidoscopy or colonoscopy), can detect the earliest forms of colorectal cancer. Talk to your health care provider about this at age 50, when routine screening begins. Direct  exam of the colon should be repeated every 5-10 years through age 75 years, unless early forms of pre-cancerous polyps or small growths are found.  People who are at an increased risk for hepatitis B should be screened for this virus. You are considered at high risk for hepatitis B if:  You were born in a country where hepatitis B occurs often. Talk with your health care provider about which countries are considered high risk.  Your parents were born in a high-risk country and you have not received a shot to protect against hepatitis B (hepatitis B vaccine).  You have HIV or AIDS.  You use needles to inject street drugs.  You live with, or have sex with, someone who has hepatitis B.  You get hemodialysis treatment.  You take certain medicines for conditions like cancer, organ transplantation, and autoimmune conditions.  Hepatitis C blood testing is recommended for all people born from 1945 through 1965 and any individual with known risks for hepatitis C.  Practice safe sex. Use condoms and avoid high-risk sexual practices to reduce the spread of sexually transmitted infections (STIs). STIs include gonorrhea, chlamydia, syphilis, trichomonas, herpes, HPV, and human immunodeficiency virus (HIV). Herpes, HIV, and HPV are viral illnesses that have no cure. They can result in disability, cancer, and death.  You should be screened for sexually transmitted illnesses (STIs) including gonorrhea and chlamydia if:  You are sexually active and are younger than 24 years.  You are older than 24 years and your health care provider tells you that you are at risk for this type of infection.  Your sexual activity has changed since you were last screened and you are at an increased risk for chlamydia or gonorrhea. Ask your health care provider if you are at risk.  If you are at risk of being infected with HIV, it is recommended that you take a prescription medicine daily to prevent HIV infection. This is  called preexposure prophylaxis (PrEP). You are considered at risk if:  You are a heterosexual woman, are sexually active, and are at increased risk for HIV infection.  You take drugs by injection.  You are sexually active with a partner who has HIV.  Talk with your health care provider about whether you are at high risk of being infected with HIV. If you choose to begin PrEP, you should first be tested for HIV. You should then be tested every 3 months for as long as you are taking PrEP.  Osteoporosis is a disease in which the bones lose minerals and strength   with aging. This can result in serious bone fractures or breaks. The risk of osteoporosis can be identified using a bone density scan. Women ages 65 years and over and women at risk for fractures or osteoporosis should discuss screening with their health care providers. Ask your health care provider whether you should take a calcium supplement or vitamin D to reduce the rate of osteoporosis.  Menopause can be associated with physical symptoms and risks. Hormone replacement therapy is available to decrease symptoms and risks. You should talk to your health care provider about whether hormone replacement therapy is right for you.  Use sunscreen. Apply sunscreen liberally and repeatedly throughout the day. You should seek shade when your shadow is shorter than you. Protect yourself by wearing long sleeves, pants, a wide-brimmed hat, and sunglasses year round, whenever you are outdoors.  Once a month, do a whole body skin exam, using a mirror to look at the skin on your back. Tell your health care provider of new moles, moles that have irregular borders, moles that are larger than a pencil eraser, or moles that have changed in shape or color.  Stay current with required vaccines (immunizations).  Influenza vaccine. All adults should be immunized every year.  Tetanus, diphtheria, and acellular pertussis (Td, Tdap) vaccine. Pregnant women should  receive 1 dose of Tdap vaccine during each pregnancy. The dose should be obtained regardless of the length of time since the last dose. Immunization is preferred during the 27th-36th week of gestation. An adult who has not previously received Tdap or who does not know her vaccine status should receive 1 dose of Tdap. This initial dose should be followed by tetanus and diphtheria toxoids (Td) booster doses every 10 years. Adults with an unknown or incomplete history of completing a 3-dose immunization series with Td-containing vaccines should begin or complete a primary immunization series including a Tdap dose. Adults should receive a Td booster every 10 years.  Varicella vaccine. An adult without evidence of immunity to varicella should receive 2 doses or a second dose if she has previously received 1 dose. Pregnant females who do not have evidence of immunity should receive the first dose after pregnancy. This first dose should be obtained before leaving the health care facility. The second dose should be obtained 4-8 weeks after the first dose.  Human papillomavirus (HPV) vaccine. Females aged 13-26 years who have not received the vaccine previously should obtain the 3-dose series. The vaccine is not recommended for use in pregnant females. However, pregnancy testing is not needed before receiving a dose. If a female is found to be pregnant after receiving a dose, no treatment is needed. In that case, the remaining doses should be delayed until after the pregnancy. Immunization is recommended for any person with an immunocompromised condition through the age of 26 years if she did not get any or all doses earlier. During the 3-dose series, the second dose should be obtained 4-8 weeks after the first dose. The third dose should be obtained 24 weeks after the first dose and 16 weeks after the second dose.  Zoster vaccine. One dose is recommended for adults aged 60 years or older unless certain conditions are  present.  Measles, mumps, and rubella (MMR) vaccine. Adults born before 1957 generally are considered immune to measles and mumps. Adults born in 1957 or later should have 1 or more doses of MMR vaccine unless there is a contraindication to the vaccine or there is laboratory evidence of immunity to   each of the three diseases. A routine second dose of MMR vaccine should be obtained at least 28 days after the first dose for students attending postsecondary schools, health care workers, or international travelers. People who received inactivated measles vaccine or an unknown type of measles vaccine during 1963-1967 should receive 2 doses of MMR vaccine. People who received inactivated mumps vaccine or an unknown type of mumps vaccine before 1979 and are at high risk for mumps infection should consider immunization with 2 doses of MMR vaccine. For females of childbearing age, rubella immunity should be determined. If there is no evidence of immunity, females who are not pregnant should be vaccinated. If there is no evidence of immunity, females who are pregnant should delay immunization until after pregnancy. Unvaccinated health care workers born before 1957 who lack laboratory evidence of measles, mumps, or rubella immunity or laboratory confirmation of disease should consider measles and mumps immunization with 2 doses of MMR vaccine or rubella immunization with 1 dose of MMR vaccine.  Pneumococcal 13-valent conjugate (PCV13) vaccine. When indicated, a person who is uncertain of her immunization history and has no record of immunization should receive the PCV13 vaccine. An adult aged 19 years or older who has certain medical conditions and has not been previously immunized should receive 1 dose of PCV13 vaccine. This PCV13 should be followed with a dose of pneumococcal polysaccharide (PPSV23) vaccine. The PPSV23 vaccine dose should be obtained at least 8 weeks after the dose of PCV13 vaccine. An adult aged 19  years or older who has certain medical conditions and previously received 1 or more doses of PPSV23 vaccine should receive 1 dose of PCV13. The PCV13 vaccine dose should be obtained 1 or more years after the last PPSV23 vaccine dose.  Pneumococcal polysaccharide (PPSV23) vaccine. When PCV13 is also indicated, PCV13 should be obtained first. All adults aged 65 years and older should be immunized. An adult younger than age 65 years who has certain medical conditions should be immunized. Any person who resides in a nursing home or long-term care facility should be immunized. An adult smoker should be immunized. People with an immunocompromised condition and certain other conditions should receive both PCV13 and PPSV23 vaccines. People with human immunodeficiency virus (HIV) infection should be immunized as soon as possible after diagnosis. Immunization during chemotherapy or radiation therapy should be avoided. Routine use of PPSV23 vaccine is not recommended for American Indians, [place] Natives, or people younger than 65 years unless there are medical conditions that require PPSV23 vaccine. When indicated, people who have unknown immunization and have no record of immunization should receive PPSV23 vaccine. One-time revaccination 5 years after the first dose of PPSV23 is recommended for people aged 19-64 years who have chronic kidney failure, nephrotic syndrome, asplenia, or immunocompromised conditions. People who received 1-2 doses of PPSV23 before age 65 years should receive another dose of PPSV23 vaccine at age 65 years or later if at least 5 years have passed since the previous dose. Doses of PPSV23 are not needed for people immunized with PPSV23 at or after age 65 years.  Meningococcal vaccine. Adults with asplenia or persistent complement component deficiencies should receive 2 doses of quadrivalent meningococcal conjugate (MenACWY-D) vaccine. The doses should be obtained at least 2 months apart.  Microbiologists working with certain meningococcal bacteria, military recruits, people at risk during an outbreak, and people who travel to or live in countries with a high rate of meningitis should be immunized. A first-year college student up through age   21 years who is living in a residence hall should receive a dose if she did not receive a dose on or after her 16th birthday. Adults who have certain high-risk conditions should receive one or more doses of vaccine.  Hepatitis A vaccine. Adults who wish to be protected from this disease, have certain high-risk conditions, work with hepatitis A-infected animals, work in hepatitis A research labs, or travel to or work in countries with a high rate of hepatitis A should be immunized. Adults who were previously unvaccinated and who anticipate close contact with an international adoptee during the first 60 days after arrival in the [place] from a country with a high rate of hepatitis A should be immunized.  Hepatitis B vaccine. Adults who wish to be protected from this disease, have certain high-risk conditions, may be exposed to blood or other infectious body fluids, are household contacts or sex partners of hepatitis B positive people, are clients or workers in certain care facilities, or travel to or work in countries with a high rate of hepatitis B should be immunized.  Haemophilus influenzae type b (Hib) vaccine. A previously unvaccinated person with asplenia or sickle cell disease or having a scheduled splenectomy should receive 1 dose of Hib vaccine. Regardless of previous immunization, a recipient of a hematopoietic stem cell transplant should receive a 3-dose series 6-12 months after her successful transplant. Hib vaccine is not recommended for adults with HIV infection. Preventive Services / Frequency Ages 64 to 68 years  Blood pressure check.** / Every 1 to 2 years.  Lipid and cholesterol check.** / Every 5 years beginning at age  22.  Clinical breast exam.** / Every 3 years for women in their 88s and 53s.  BRCA-related cancer risk assessment.** / For women who have family members with a BRCA-related cancer (breast, ovarian, tubal, or peritoneal cancers).  Pap test.** / Every 2 years from ages 90 through 51. Every 3 years starting at age 21 through age 56 or 3 with a history of 3 consecutive normal Pap tests.  HPV screening.** / Every 3 years from ages 24 through ages 1 to 46 with a history of 3 consecutive normal Pap tests.  Hepatitis C blood test.** / For any individual with known risks for hepatitis C.  Skin self-exam. / Monthly.  Influenza vaccine. / Every year.  Tetanus, diphtheria, and acellular pertussis (Tdap, Td) vaccine.** / Consult your health care provider. Pregnant women should receive 1 dose of Tdap vaccine during each pregnancy. 1 dose of Td every 10 years.  Varicella vaccine.** / Consult your health care provider. Pregnant females who do not have evidence of immunity should receive the first dose after pregnancy.  HPV vaccine. / 3 doses over 6 months, if 72 and younger. The vaccine is not recommended for use in pregnant females. However, pregnancy testing is not needed before receiving a dose.  Measles, mumps, rubella (MMR) vaccine.** / You need at least 1 dose of MMR if you were born in 1957 or later. You may also need a 2nd dose. For females of childbearing age, rubella immunity should be determined. If there is no evidence of immunity, females who are not pregnant should be vaccinated. If there is no evidence of immunity, females who are pregnant should delay immunization until after pregnancy.  Pneumococcal 13-valent conjugate (PCV13) vaccine.** / Consult your health care provider.  Pneumococcal polysaccharide (PPSV23) vaccine.** / 1 to 2 doses if you smoke cigarettes or if you have certain conditions.  Meningococcal vaccine.** /  1 dose if you are age 19 to 21 years and a first-year college  student living in a residence hall, or have one of several medical conditions, you need to get vaccinated against meningococcal disease. You may also need additional booster doses.  Hepatitis A vaccine.** / Consult your health care provider.  Hepatitis B vaccine.** / Consult your health care provider.  Haemophilus influenzae type b (Hib) vaccine.** / Consult your health care provider. Ages 40 to 64 years  Blood pressure check.** / Every 1 to 2 years.  Lipid and cholesterol check.** / Every 5 years beginning at age 20 years.  Lung cancer screening. / Every year if you are aged 55-80 years and have a 30-pack-year history of smoking and currently smoke or have quit within the past 15 years. Yearly screening is stopped once you have quit smoking for at least 15 years or develop a health problem that would prevent you from having lung cancer treatment.  Clinical breast exam.** / Every year after age 40 years.  BRCA-related cancer risk assessment.** / For women who have family members with a BRCA-related cancer (breast, ovarian, tubal, or peritoneal cancers).  Mammogram.** / Every year beginning at age 40 years and continuing for as long as you are in good health. Consult with your health care provider.  Pap test.** / Every 3 years starting at age 30 years through age 65 or 70 years with a history of 3 consecutive normal Pap tests.  HPV screening.** / Every 3 years from ages 30 years through ages 65 to 70 years with a history of 3 consecutive normal Pap tests.  Fecal occult blood test (FOBT) of stool. / Every year beginning at age 50 years and continuing until age 75 years. You may not need to do this test if you get a colonoscopy every 10 years.  Flexible sigmoidoscopy or colonoscopy.** / Every 5 years for a flexible sigmoidoscopy or every 10 years for a colonoscopy beginning at age 50 years and continuing until age 75 years.  Hepatitis C blood test.** / For all people born from 1945 through  1965 and any individual with known risks for hepatitis C.  Skin self-exam. / Monthly.  Influenza vaccine. / Every year.  Tetanus, diphtheria, and acellular pertussis (Tdap/Td) vaccine.** / Consult your health care provider. Pregnant women should receive 1 dose of Tdap vaccine during each pregnancy. 1 dose of Td every 10 years.  Varicella vaccine.** / Consult your health care provider. Pregnant females who do not have evidence of immunity should receive the first dose after pregnancy.  Zoster vaccine.** / 1 dose for adults aged 60 years or older.  Measles, mumps, rubella (MMR) vaccine.** / You need at least 1 dose of MMR if you were born in 1957 or later. You may also need a 2nd dose. For females of childbearing age, rubella immunity should be determined. If there is no evidence of immunity, females who are not pregnant should be vaccinated. If there is no evidence of immunity, females who are pregnant should delay immunization until after pregnancy.  Pneumococcal 13-valent conjugate (PCV13) vaccine.** / Consult your health care provider.  Pneumococcal polysaccharide (PPSV23) vaccine.** / 1 to 2 doses if you smoke cigarettes or if you have certain conditions.  Meningococcal vaccine.** / Consult your health care provider.  Hepatitis A vaccine.** / Consult your health care provider.  Hepatitis B vaccine.** / Consult your health care provider.  Haemophilus influenzae type b (Hib) vaccine.** / Consult your health care provider. Ages 65   years and over  Blood pressure check.** / Every 1 to 2 years.  Lipid and cholesterol check.** / Every 5 years beginning at age 22 years.  Lung cancer screening. / Every year if you are aged 73-80 years and have a 30-pack-year history of smoking and currently smoke or have quit within the past 15 years. Yearly screening is stopped once you have quit smoking for at least 15 years or develop a health problem that would prevent you from having lung cancer  treatment.  Clinical breast exam.** / Every year after age 4 years.  BRCA-related cancer risk assessment.** / For women who have family members with a BRCA-related cancer (breast, ovarian, tubal, or peritoneal cancers).  Mammogram.** / Every year beginning at age 40 years and continuing for as long as you are in good health. Consult with your health care provider.  Pap test.** / Every 3 years starting at age 9 years through age 34 or 91 years with 3 consecutive normal Pap tests. Testing can be stopped between 65 and 70 years with 3 consecutive normal Pap tests and no abnormal Pap or HPV tests in the past 10 years.  HPV screening.** / Every 3 years from ages 57 years through ages 64 or 45 years with a history of 3 consecutive normal Pap tests. Testing can be stopped between 65 and 70 years with 3 consecutive normal Pap tests and no abnormal Pap or HPV tests in the past 10 years.  Fecal occult blood test (FOBT) of stool. / Every year beginning at age 15 years and continuing until age 17 years. You may not need to do this test if you get a colonoscopy every 10 years.  Flexible sigmoidoscopy or colonoscopy.** / Every 5 years for a flexible sigmoidoscopy or every 10 years for a colonoscopy beginning at age 86 years and continuing until age 71 years.  Hepatitis C blood test.** / For all people born from [birth year] through 1967 and any individual with known risks for hepatitis C.  Osteoporosis screening.** / A one-time screening for women ages 83 years and over and women at risk for fractures or osteoporosis.  Skin self-exam. / Monthly.  Influenza vaccine. / Every year.  Tetanus, diphtheria, and acellular pertussis (Tdap/Td) vaccine.** / 1 dose of Td every 10 years.  Varicella vaccine.** / Consult your health care provider.  Zoster vaccine.** / 1 dose for adults aged 61 years or older.  Pneumococcal 13-valent conjugate (PCV13) vaccine.** / Consult your health care provider.  Pneumococcal  polysaccharide (PPSV23) vaccine.** / 1 dose for all adults aged 28 years and older.  Meningococcal vaccine.** / Consult your health care provider.  Hepatitis A vaccine.** / Consult your health care provider.  Hepatitis B vaccine.** / Consult your health care provider.  Haemophilus influenzae type b (Hib) vaccine.** / Consult your health care provider. ** Family history and personal history of risk and conditions may change your health care provider's recommendations. Document Released: 08/26/2001 Document Revised: 11/14/2013 Document Reviewed: 11/25/2010 [facility] Patient Information 2015 [place], [place]. This information is not intended to replace advice given to you by your health care provider. Make sure you discuss any questions you have with your health care provider.

## 2014-06-19 NOTE — Progress Notes (Signed)
Crystal [name]  [id number] [birth year] 06/19/2014      Progress Note-Follow Up  Subjective  Chief Complaint  Chief Complaint  Patient presents with  . Annual Exam    medicare wellness    HPI  Patient is a 63 y.o. female in today for routine medical care. Patient is in today, struggling with worsening anhedonia. She has been struggling with increased depression secondary to increased stressors. She denies suicidal ideation. She is struggling with increased pain in her feet injury is also struggling with right-sided elbow and shoulder pain without acute injury. Denies CP/palp/SOB/HA/congestion/fevers/GI or GU c/o. Taking meds as prescribed  Past Medical History  Diagnosis Date  . Hypertension   . Hyperlipemia   . Dyspepsia   . OSA (obstructive sleep apnea)   . Plantar fasciitis   . Depression   . Asthma     02/08/10 FEV1 1.98 (80%), s/p saba 2.22 l/m (90%).nml  . Anxiety   . 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 Fatty liver 12/19/2010  . Internal hemorrhoids   . Diverticulosis   . Allergy     dust, cock roach,grass,weeds,molds  . GERD (gastroesophageal reflux disease)   . Acute peptic ulcer of stomach     As a teenager  . IBS (irritable bowel syndrome)   . Cataract     Past Surgical History  Procedure Laterality Date  . Knee arthroscopy Left 1998  . Toe surgery Right 1995  . Nasal septum surgery    . Rhinoplasty    . Appendectomy      age 37  . Colonoscopy  06/03/2010, 08/25/2011    diverticulosis   . Gastrectomy      partial - ulcer as a teen    Family History  Problem Relation Age of Onset  . Diabetes Mother     type II  . Heart attack Mother 31  . Ulcers      bleeding gastric  . Colon cancer Sister   . Heart defect      child  . Other      perforated bowel, child    History   Social History  . Marital Status: Married    Spouse Name: [name]    Number of Children: 4  . Years of Education: N/A   Occupational History  . DATA ENTRY [name]    Social History Main Topics  . Smoking status: Never Smoker   . Smokeless tobacco: Never Used  . Alcohol Use: No  . Drug Use: No  . Sexual Activity: Not on file   Other Topics Concern  . Not on file   Social History Narrative   No caffeine drinks daily     Current Outpatient Prescriptions on File Prior to Visit  Medication Sig Dispense Refill  . ACCU-CHEK FASTCLIX LANCETS MISC Check Blood Glucose bid  DX 250.81 102 each 1  . albuterol (PROAIR HFA) 108 (90 BASE) MCG/ACT inhaler Inhale 2 puffs into the lungs every 4 (four) hours as needed. Shortness of breath and wheezing 18 g 0  . alosetron (LOTRONEX) 1 MG tablet Take 1 tablet (1 mg total) by mouth 2 (two) times daily. And as directed 180 tablet 3  . aspirin 81 MG tablet Take 81 mg by mouth daily.    Marland Kitchen atorvastatin (LIPITOR) 80 MG tablet TAKE 1 TABLET (80 MG TOTAL) BY MOUTH DAILY. 90 tablet 0  . Blood Glucose Monitoring Suppl (ACCU-CHEK AVIVA PLUS) W/DEVICE KIT 1 kit by Does not apply route  daily. DX 250.00 1 kit 0  . clotrimazole-betamethasone (LOTRISONE) cream     . dicyclomine (BENTYL) 20 MG tablet Take 1 tablet (20 mg total) by mouth every 6 (six) hours as needed (cramps/diarrhea). 40 tablet 5  . escitalopram (LEXAPRO) 10 MG tablet TAKE 1 TABLET (10 MG TOTAL) BY MOUTH DAILY. 90 tablet 1  . fluticasone (FLONASE) 50 MCG/ACT nasal spray Place 1 spray into both nostrils daily. 16 g 0  . glimepiride (AMARYL) 1 MG tablet TAKE 1 TABLET (1 MG TOTAL) BY MOUTH DAILY WITH BREAKFAST. 90 tablet 1  . glucose blood (ACCU-CHEK AVIVA PLUS) test strip TEST BLOOD SUGARS 1 TO 2 TIMES A DAY. Dx 250.00 180 each 1  . INVOKANA 100 MG TABS TAKE 1 TABLET (100 MG TOTAL) BY MOUTH DAILY. 90 tablet 1  . lisinopril (PRINIVIL,ZESTRIL) 2.5 MG tablet TAKE 1 TABLET (2.5 MG TOTAL) BY MOUTH DAILY. 90 tablet 1  . mometasone (NASONEX) 50 MCG/ACT nasal spray Place 2 sprays into the nose daily as needed. 17 g 6  . Multiple Vitamins-Minerals (CENTRUM SILVER PO) Take 1  tablet by mouth daily.      . nitroGLYCERIN (NITRODUR - DOSED IN MG/24 HR) 0.2 mg/hr patch     . ondansetron (ZOFRAN) 4 MG tablet Take 1 tablet (4 mg total) by mouth every 8 (eight) hours as needed for nausea. 60 tablet 1  . pantoprazole (PROTONIX) 40 MG tablet TAKE 1 TABLET (40 MG TOTAL) BY MOUTH DAILY. 90 tablet 1  . Probiotic Product (ALIGN) 4 MG CAPS Take 1 capsule by mouth daily.      . sertraline (ZOLOFT) 50 MG tablet Take 1 tablet (50 mg total) by mouth daily. 90 tablet 0  . sitaGLIPtin (JANUVIA) 100 MG tablet Take 1 tablet (100 mg total) by mouth daily. 90 tablet 0  . valACYclovir (VALTREX) 500 MG tablet TAKE 1 TABLET (500 MG TOTAL) BY MOUTH DAILY. 90 tablet 0   No current facility-administered medications on file prior to visit.    Allergies  Allergen Reactions  . Compazine [Prochlorperazine Edisylate]   . Prochlorperazine Edisylate Other (See Comments)    coma for 3 days  . Tetanus Toxoid Other (See Comments)    convulsions    Review of Systems  Review of Systems  Constitutional: Positive for malaise/fatigue. Negative for fever.  HENT: Negative for congestion.   Eyes: Negative for discharge.  Respiratory: Negative for shortness of breath.   Cardiovascular: Negative for chest pain, palpitations and leg swelling.  Gastrointestinal: Negative for nausea, abdominal pain and diarrhea.  Genitourinary: Negative for dysuria.  Musculoskeletal: Negative for falls.  Skin: Negative for rash.  Neurological: Negative for loss of consciousness and headaches.  Endo/Heme/Allergies: Negative for polydipsia.  Psychiatric/Behavioral: Positive for depression. Negative for suicidal ideas. The patient is nervous/anxious. The patient does not have insomnia.     Objective  BP 129/85 mmHg  Pulse 81  Temp(Src) 97.4 F (36.3 C) (Oral)  Ht '5\' 3"'  (1.6 m)  Wt 156 lb 6.4 oz (70.943 kg)  BMI 27.71 kg/m2  SpO2 94%  Physical Exam  Physical Exam  Constitutional: She is oriented to person,  place, and time and well-developed, well-nourished, and in no distress. No distress.  HENT:  Head: Normocephalic and atraumatic.  Eyes: Conjunctivae are normal.  Neck: Neck supple. No thyromegaly present.  Cardiovascular: Normal rate, regular rhythm and normal heart sounds.   No murmur heard. Pulmonary/Chest: Effort normal and breath sounds normal. She has no wheezes.  Abdominal: She exhibits no distension  and no mass.  Musculoskeletal: She exhibits no edema.  Lymphadenopathy:    She has no cervical adenopathy.  Neurological: She is alert and oriented to person, place, and time.  Skin: Skin is warm and dry. No rash noted. She is not diaphoretic.  Psychiatric: Memory, affect and judgment normal.    Lab Results  Component Value Date   TSH 2.819 12/27/2013   Lab Results  Component Value Date   WBC 7.0 01/17/2014   HGB 14.1 01/17/2014   HCT 42.2 01/17/2014   MCV 94.0 01/17/2014   PLT 245 01/17/2014   Lab Results  Component Value Date   CREATININE 0.70 01/17/2014   BUN 13 01/17/2014   NA 143 01/17/2014   K 4.7 01/17/2014   CL 105 01/17/2014   CO2 23 01/17/2014   Lab Results  Component Value Date   ALT 21 12/27/2013   AST 18 12/27/2013   ALKPHOS 62 12/27/2013   BILITOT 0.6 12/27/2013   Lab Results  Component Value Date   CHOL 189 12/27/2013   Lab Results  Component Value Date   HDL 49 12/27/2013   Lab Results  Component Value Date   LDLCALC 94 12/27/2013   Lab Results  Component Value Date   TRIG 228* 12/27/2013   Lab Results  Component Value Date   CHOLHDL 3.9 12/27/2013     Assessment & Plan  Essential hypertension Well controlled, no changes to meds. Encouraged heart healthy diet such as the DASH diet and exercise as tolerated.   GERD (gastroesophageal reflux disease) Avoid offending foods, start probiotics. Do not eat large meals in late evening and consider raising head of bed.   DM (diabetes mellitus), type 2 with neurological  complications [id number] acceptable, minimize simple carbs. Increase exercise as tolerated. Continue current meds  Subclinical hypothyroidism Mild elevation of TSH, will continue to monitor.   Hyperlipidemia Tolerating statin, encouraged heart healthy diet, avoid trans fats, minimize simple carbs and saturated fats. Increase exercise as tolerated  DEPRESSION/ANXIETY Worsening with recent increased stressors. Struggling with anhedonia. Will switch from Escitalopram to Sertraline and reassess.   Medicare annual wellness visit, subsequent Patient denies any difficulties at home. No trouble with ADLs, depression or falls. No recent changes to vision or hearing. Is UTD with immunizations. Is UTD with screening. Discussed Advanced Directives, patient agrees to bring [place] copies of documents if can. Encouraged heart healthy diet, exercise as tolerated and adequate sleep. Sees Dr Carlean Purl of GI Sees Dr Barbaraann Barthel of [facility] Sees Dr Elsworth Soho of pulmonology Pap 08/2013, repeat qo3 years. MGM 03/2014, repeat qoyear

## 2014-06-19 NOTE — Progress Notes (Signed)
Pre visit review using our clinic review tool, if applicable. No additional management support is needed unless otherwise documented below in the visit note. 

## 2014-06-20 LAB — CBC
HEMATOCRIT: 42.9 % (ref 36.0–46.0)
Hemoglobin: 13.9 g/dL (ref 12.0–15.0)
MCHC: 32.4 g/dL (ref 30.0–36.0)
MCV: 94.7 fl (ref 78.0–100.0)
Platelets: 231 10*3/uL (ref 150.0–400.0)
RBC: 4.53 Mil/uL (ref 3.87–5.11)
RDW: 13.4 % (ref 11.5–15.5)
WBC: 7.4 10*3/uL (ref 4.0–10.5)

## 2014-06-20 LAB — HEPATIC FUNCTION PANEL
ALBUMIN: 4.2 g/dL (ref 3.5–5.2)
ALT: 27 U/L (ref 0–35)
AST: 25 U/L (ref 0–37)
Alkaline Phosphatase: 47 U/L (ref 39–117)
BILIRUBIN TOTAL: 0.6 mg/dL (ref 0.2–1.2)
Bilirubin, Direct: 0.1 mg/dL (ref 0.0–0.3)
Total Protein: 7.2 g/dL (ref 6.0–8.3)

## 2014-06-20 LAB — RENAL FUNCTION PANEL
Albumin: 4.2 g/dL (ref 3.5–5.2)
BUN: 19 mg/dL (ref 6–23)
CO2: 23 mEq/L (ref 19–32)
CREATININE: 0.7 mg/dL (ref 0.4–1.2)
Calcium: 9.5 mg/dL (ref 8.4–10.5)
Chloride: 105 mEq/L (ref 96–112)
GFR: 90.71 mL/min (ref >60.00)
GLUCOSE: 116 mg/dL — AB (ref 70–99)
PHOSPHORUS: 3.7 mg/dL (ref 2.3–4.6)
Potassium: 3.5 mEq/L (ref 3.5–5.1)
SODIUM: 139 meq/L (ref 135–145)

## 2014-06-20 LAB — LIPID PANEL
CHOLESTEROL: 156 mg/dL (ref 0–200)
HDL: 49.3 mg/dL (ref >39.00)
LDL Cholesterol: 81 mg/dL (ref 0–99)
NONHDL: 106.7
Total CHOL/HDL Ratio: 3
Triglycerides: 130 mg/dL (ref 0.0–149.0)
VLDL: 26 mg/dL (ref 0.0–40.0)

## 2014-06-20 LAB — HEMOGLOBIN A1C: Hgb A1c MFr Bld: 7.2 % — ABNORMAL HIGH (ref 4.6–6.5)

## 2014-06-20 LAB — TSH: TSH: 4.76 u[IU]/mL — AB (ref 0.35–4.50)

## 2014-06-22 ENCOUNTER — Ambulatory Visit (INDEPENDENT_AMBULATORY_CARE_PROVIDER_SITE_OTHER): Payer: Medicare HMO

## 2014-06-22 ENCOUNTER — Encounter: Payer: Self-pay | Admitting: Podiatry

## 2014-06-22 ENCOUNTER — Encounter: Payer: Self-pay | Admitting: Family Medicine

## 2014-06-22 ENCOUNTER — Ambulatory Visit (INDEPENDENT_AMBULATORY_CARE_PROVIDER_SITE_OTHER): Payer: Medicare HMO | Admitting: Podiatry

## 2014-06-22 VITALS — BP 122/74 | HR 78 | Resp 16

## 2014-06-22 DIAGNOSIS — L600 Ingrowing nail: Secondary | ICD-10-CM

## 2014-06-22 DIAGNOSIS — M2041 Other hammer toe(s) (acquired), right foot: Secondary | ICD-10-CM

## 2014-06-22 MED ORDER — GLIMEPIRIDE 2 MG PO TABS: 2.0000 mg | ORAL_TABLET | Freq: Every day | ORAL | Status: DC

## 2014-06-22 NOTE — Patient Instructions (Signed)
ANTIBACTERIAL SOAP INSTRUCTIONS  THE DAY AFTER PROCEDURE  Please follow the instructions your doctor has marked.   Shower as usual. Before getting out, place a drop of antibacterial liquid soap (Dial) on a wet, clean washcloth.  Gently wipe washcloth over affected area.  Afterward, rinse the area with warm water.  Blot the area dry with a soft cloth and cover with antibiotic ointment (neosporin, polysporin, bacitracin) and band aid or gauze and tape  OR    Place 3-4 drops of antibacterial liquid soap in a quart of warm tap water.  Submerge foot into water for 20 minutes.  If bandage was applied after your procedure, leave on to allow for easy lift off, then remove and continue with soak for the remaining time.  Next, blot area dry with a soft cloth and cover with a bandage.  Apply other medications as directed by your doctor, such as cortisporin otic solution (eardrops) or neosporin antibiotic ointment 

## 2014-06-22 NOTE — Progress Notes (Signed)
   Subjective:    Patient ID: Crystal [name], female    DOB: February 03, 1951, 63 y.o.   MRN: [id number]  HPI Comments: "I have this dark spot on the toe"  Patient c/o tender 1st toe right lateral border for couple years. The area is red and swollen.       Review of Systems  HENT: Positive for hearing loss, sinus pressure and tinnitus.   Respiratory: Positive for shortness of breath.   Endocrine: Positive for heat intolerance.  Musculoskeletal: Positive for myalgias, back pain and arthralgias.  All other systems reviewed and are negative.      Objective:   Physical Exam        Assessment & Plan:

## 2014-06-24 NOTE — Progress Notes (Signed)
Subjective:     Patient ID: Crystal [name], female   DOB: Nov 16, 1950, 63 y.o.   MRN: [id number]  HPI patient presents with deformity and pain in the right big toenail lateral border and states that she cannot cut it and it is becoming increasingly painful with shoe gear with no drainage noted currently   Review of Systems  All other systems reviewed and are negative.      Objective:   Physical Exam  Constitutional: She is oriented to person, place, and time.  Cardiovascular: Intact distal pulses.   Musculoskeletal: Normal range of motion.  Neurological: She is oriented to person, place, and time.  Skin: Skin is warm and dry.  Nursing note and vitals reviewed.  neurovascular status intact with muscle strength adequate and range of motion subtalar and midtarsal joint within normal limits. Patient is noted to have incurvated right hallux lateral border with pain and deformity within the nailbed with no drainage noted. Patient is noted to have good digital perfusion is well oriented 3 and does not have equinus condition noted     Assessment:     Ingrown toenail deformity right hallux lateral border with pain    Plan:     H&P and condition discussed with patient. At this point I have recommended removal of the nail corner and explained surgery and risk and recovery. She wants procedure and today I infiltrated 60 mg Xylocaine Marcaine mixture exposed and removed the lateral corner exposed matrix and apply chemical 3 applications 30 seconds followed by alcohol lavaged and sterile dressing. Gave instructions on soaks and reappoint

## 2014-06-25 DIAGNOSIS — Z0000 Encounter for general adult medical examination without abnormal findings: Secondary | ICD-10-CM | POA: Insufficient documentation

## 2014-06-25 NOTE — Assessment & Plan Note (Signed)
Mild elevation of TSH, will continue to monitor.

## 2014-06-25 NOTE — Assessment & Plan Note (Signed)
Tolerating statin, encouraged heart healthy diet, avoid trans fats, minimize simple carbs and saturated fats. Increase exercise as tolerated 

## 2014-06-25 NOTE — Assessment & Plan Note (Signed)
Worsening with recent increased stressors. Struggling with anhedonia. Will switch from Escitalopram to Sertraline and reassess.

## 2014-06-25 NOTE — Assessment & Plan Note (Addendum)
Patient denies any difficulties at home. No trouble with ADLs, depression or falls. No recent changes to vision or hearing. Is UTD with immunizations. Is UTD with screening. Discussed Advanced Directives, patient agrees to bring [place] copies of documents if can. Encouraged heart healthy diet, exercise as tolerated and adequate sleep. Sees Dr Carlean Purl of GI Sees Dr Barbaraann Barthel of Sports [facility]s Sees Dr Elsworth Soho of pulmonology Pap 08/2013, repeat qo3 years. MGM 03/2014, repeat qoyear

## 2014-06-25 NOTE — Assessment & Plan Note (Signed)
hgba1c acceptable, minimize simple carbs. Increase exercise as tolerated. Continue current meds 

## 2014-06-25 NOTE — Assessment & Plan Note (Signed)
Avoid offending foods, start probiotics. Do not eat large meals in late evening and consider raising head of bed.  

## 2014-06-25 NOTE — Assessment & Plan Note (Signed)
Well controlled, no changes to meds. Encouraged heart healthy diet such as the DASH diet and exercise as tolerated.  °

## 2014-06-26 ENCOUNTER — Ambulatory Visit: Payer: Self-pay | Admitting: Podiatry

## 2014-06-28 ENCOUNTER — Other Ambulatory Visit: Payer: Medicare HMO

## 2014-06-28 ENCOUNTER — Ambulatory Visit: Payer: Medicare HMO | Attending: Family Medicine | Admitting: Rehabilitation

## 2014-06-28 DIAGNOSIS — M25579 Pain in unspecified ankle and joints of unspecified foot: Secondary | ICD-10-CM | POA: Diagnosis present

## 2014-06-28 DIAGNOSIS — M79601 Pain in right arm: Secondary | ICD-10-CM | POA: Insufficient documentation

## 2014-07-04 ENCOUNTER — Ambulatory Visit: Payer: Medicare HMO

## 2014-07-06 ENCOUNTER — Ambulatory Visit: Payer: Medicare HMO

## 2014-07-11 ENCOUNTER — Ambulatory Visit: Payer: Medicare HMO | Admitting: Rehabilitation

## 2014-07-14 HISTORY — PX: CATARACT EXTRACTION: SUR2

## 2014-07-17 ENCOUNTER — Ambulatory Visit: Payer: Medicare HMO | Admitting: Rehabilitation

## 2014-07-20 ENCOUNTER — Ambulatory Visit: Payer: Medicare HMO | Admitting: Rehabilitation

## 2014-07-25 ENCOUNTER — Ambulatory Visit: Payer: Medicare HMO | Admitting: Rehabilitation

## 2014-07-27 ENCOUNTER — Ambulatory Visit: Payer: Medicare HMO | Admitting: Rehabilitation

## 2014-08-03 ENCOUNTER — Ambulatory Visit (INDEPENDENT_AMBULATORY_CARE_PROVIDER_SITE_OTHER): Payer: PPO | Admitting: Medical

## 2014-08-03 ENCOUNTER — Encounter: Payer: Self-pay | Admitting: Medical

## 2014-08-03 VITALS — BP 129/82 | HR 77 | Temp 97.7°F | Ht 63.0 in | Wt 154.4 lb

## 2014-08-03 DIAGNOSIS — J0100 Acute maxillary sinusitis, unspecified: Secondary | ICD-10-CM

## 2014-08-03 MED ORDER — BECLOMETHASONE DIPROPIONATE 40 MCG/ACT IN AERS: 2.0000 | INHALATION_SPRAY | Freq: Two times a day (BID) | RESPIRATORY_TRACT | Status: DC

## 2014-08-03 MED ORDER — DOXYCYCLINE HYCLATE 100 MG PO TABS: 100.0000 mg | ORAL_TABLET | Freq: Two times a day (BID) | ORAL | Status: DC

## 2014-08-03 NOTE — Progress Notes (Signed)
Subjective:    Patient ID: Crystal [name], female    DOB: 25-May-1951, 64 y.o.   MRN: [id number]  HPI   Pt in today reporting  cough, nasal congestion and runny nose for 7  Days. Sinus pressure. Left nares is sore.   Both maxillary sinus infections easily in the past. Surgery sinus past year in march. Hx of chronic sinus infections. Pt mom 11 yo and has pancreatic cancer.   Associated symptoms( below yes or no)  Fever-yes,low grade. Chills-No Chest congestion-No Sneezing- yes Itching eyes-no Sore throat- No Post-nasal drainage-Yes but mild. Wheezing-Yes. Non smoker. No hx. No current steroid inhaler. Rare use of albuterol. Used albuterol 2 times in 3 months. Purulent  Nasal drainage-yes Fatigue- yes, mild.  Past Medical History  Diagnosis Date  . Hypertension   . Hyperlipemia   . Dyspepsia   . OSA (obstructive sleep apnea)   . Plantar fasciitis   . Depression   . Asthma     02/08/10 FEV1 1.98 (80%), s/p saba 2.22 l/m (90%).nml  . Anxiety   . 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 Fatty liver 12/19/2010  . Internal hemorrhoids   . Diverticulosis   . Allergy     dust, cock roach,grass,weeds,molds  . GERD (gastroesophageal reflux disease)   . Acute peptic ulcer of stomach     As a teenager  . IBS (irritable bowel syndrome)   . Cataract     History   Social History  . Marital Status: Married    Spouse Name: [name]    Number of Children: 4  . 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 Smoking status: Never Smoker   . Smokeless tobacco: Never Used  . Alcohol Use: No  . Drug Use: No  . Sexual Activity: No     Comment: lives with her mother, no dietary restrictions   Other Topics Concern  . Not on file   Social History Narrative   No caffeine drinks daily     Past Surgical History  Procedure Laterality Date  . Knee arthroscopy Left 1998  . Toe surgery Right 1995  . Nasal septum surgery    .  Rhinoplasty    . Appendectomy      age 10  . Colonoscopy  06/03/2010, 08/25/2011    diverticulosis   . Gastrectomy      partial - ulcer as a teen    Family History  Problem Relation Age of Onset  . Diabetes Mother     type II  . Heart attack Mother 80  . Ulcers      bleeding gastric  . Colon cancer Sister   . Cancer Sister     colon cancer  . Heart defect      child  . Other      perforated bowel, child  . Asthma Maternal Grandmother   . Mental retardation Son     PTSD    Allergies  Allergen Reactions  . Compazine [Prochlorperazine Edisylate]   . Prochlorperazine Edisylate Other (See Comments)    coma for 3 days  . Tetanus Toxoid Other (See Comments)    convulsions    Current Outpatient Prescriptions on File Prior to Visit  Medication Sig Dispense Refill  . ACCU-CHEK FASTCLIX LANCETS MISC Check Blood Glucose bid  DX 250.81 102 each 1  . albuterol (PROAIR HFA) 108 (90 BASE) MCG/ACT inhaler Inhale 2  puffs into the lungs every 4 (four) hours as needed. Shortness of breath and wheezing 18 g 0  . alosetron (LOTRONEX) 1 MG tablet Take 1 tablet (1 mg total) by mouth 2 (two) times daily. And as directed 180 tablet 3  . aspirin 81 MG tablet Take 81 mg by mouth daily.    Marland Kitchen atorvastatin (LIPITOR) 80 MG tablet TAKE 1 TABLET (80 MG TOTAL) BY MOUTH DAILY. 90 tablet 0  . Blood Glucose Monitoring Suppl (ACCU-CHEK AVIVA PLUS) W/DEVICE KIT 1 kit by Does not apply route daily. DX 250.00 1 kit 0  . clotrimazole-betamethasone (LOTRISONE) cream     . dicyclomine (BENTYL) 20 MG tablet Take 1 tablet (20 mg total) by mouth every 6 (six) hours as needed (cramps/diarrhea). 40 tablet 5  . escitalopram (LEXAPRO) 10 MG tablet TAKE 1 TABLET (10 MG TOTAL) BY MOUTH DAILY. 90 tablet 1  . fenofibrate 160 MG tablet TAKE 1 TABLET (160 MG TOTAL) BY MOUTH AT BEDTIME. 90 tablet 0  . fluticasone (FLONASE) 50 MCG/ACT nasal spray Place 1 spray into both nostrils daily. 16 g 0  . glimepiride (AMARYL) 2 MG  tablet Take 1 tablet (2 mg total) by mouth daily with breakfast. 30 tablet 3  . glucose blood (ACCU-CHEK AVIVA PLUS) test strip TEST BLOOD SUGARS 1 TO 2 TIMES A DAY. Dx 250.00 180 each 1  . INVOKANA 100 MG TABS TAKE 1 TABLET (100 MG TOTAL) BY MOUTH DAILY. 90 tablet 1  . lisinopril (PRINIVIL,ZESTRIL) 2.5 MG tablet TAKE 1 TABLET (2.5 MG TOTAL) BY MOUTH DAILY. 90 tablet 1  . mometasone (NASONEX) 50 MCG/ACT nasal spray Place 2 sprays into the nose daily as needed. 17 g 6  . Multiple Vitamins-Minerals (CENTRUM SILVER PO) Take 1 tablet by mouth daily.      . nitroGLYCERIN (NITRODUR - DOSED IN MG/24 HR) 0.2 mg/hr patch     . ondansetron (ZOFRAN) 4 MG tablet Take 1 tablet (4 mg total) by mouth every 8 (eight) hours as needed for nausea. 60 tablet 1  . pantoprazole (PROTONIX) 40 MG tablet TAKE 1 TABLET (40 MG TOTAL) BY MOUTH DAILY. 90 tablet 1  . Probiotic Product (ALIGN) 4 MG CAPS Take 1 capsule by mouth daily.      . sertraline (ZOLOFT) 50 MG tablet Take 1 tablet (50 mg total) by mouth daily. 90 tablet 1  . sitaGLIPtin (JANUVIA) 100 MG tablet Take 1 tablet (100 mg total) by mouth daily. 90 tablet 0  . valACYclovir (VALTREX) 1000 MG tablet Take 1 tablet (1,000 mg total) by mouth 2 (two) times daily. 20 tablet 1  . valACYclovir (VALTREX) 500 MG tablet TAKE 1 TABLET (500 MG TOTAL) BY MOUTH DAILY. 90 tablet 0   No current facility-administered medications on file prior to visit.    BP 129/82 mmHg  Pulse 77  Temp(Src) 97.7 F (36.5 C) (Oral)  Ht _0  (1.6 m)  Wt 154 lb 6.4 oz (70.035 kg)  BMI 27.36 kg/m2  SpO2 95%          Review of Systems  Constitutional: Positive for fever and fatigue. Negative for chills.  HENT: Positive for congestion, postnasal drip, rhinorrhea, sinus pressure and sneezing.   Respiratory: Positive for cough and wheezing. Negative for shortness of breath.        Mild occasional wheeze.  Cardiovascular: Negative for chest pain and palpitations.  Musculoskeletal:  Positive for back pain.  Neurological: Negative for dizziness, syncope, weakness, light-headedness and headaches.  Hematological: Negative for adenopathy.  Does not bruise/bleed easily.  Psychiatric/Behavioral: Negative for behavioral problems and confusion.       Objective:   Physical Exam   General  Mental Status - Alert. General Appearance - Well groomed. Not in acute distress.  Skin Rashes- No Rashes.  HEENT Head- Normal. Ear Auditory Canal - Left- Normal. Right - Normal.Tympanic Membrane- Left- Normal. Right- Normal. Eye Sclera/Conjunctiva- Left- Normal. Right- Normal. Nose & Sinuses Nasal Mucosa- Left-  Boggy and Congested. Right-  Boggy and  Congested.Bilateral maxillary pressure/pain but no frontal sinus pressure. Lt nares looks normal but on palpation very tender. Mouth & Throat Lips: Upper Lip- Normal: no dryness, cracking, pallor, cyanosis, or vesicular eruption. Lower Lip-Normal: no dryness, cracking, pallor, cyanosis or vesicular eruption. Buccal Mucosa- Bilateral- No Aphthous ulcers. Oropharynx- No Discharge or Erythema. Tonsils: Characteristics- Bilateral- No Erythema or Congestion. Size/Enlargement- Bilateral- No enlargement. Discharge- bilateral-None.  Neck Neck- Supple. No Masses.   Chest and Lung Exam Auscultation: Breath Sounds:-Clear even and unlabored.  Cardiovascular Auscultation:Rythm- Regular, rate and rhythm. Murmurs & Other Heart Sounds:Ausculatation of the heart reveal- No Murmurs.  Lymphatic Head & Neck General Head & Neck Lymphatics: Bilateral: Description- No Localized lymphadenopathy.         Assessment & Plan:

## 2014-08-03 NOTE — Patient Instructions (Addendum)
Your appear to have a sinus infection. I am prescribing doxycycline antibiotic for the infection. To help with the nasal congestion you could use you nasal steroid(if tolerated). For your associated cough, you can continue your cough syrup at home.  Note the doxycycline may help with your nostril as well.(compared to other antibiotics)  If you find that you are having to use albuterol every 6 hours then I am making qvar available.   Rest, hydrate, tylenol for fever.  Follow up in 7 days or as needed.

## 2014-08-03 NOTE — Assessment & Plan Note (Signed)
Your appear to have a sinus infection. I am prescribing doxycycline antibiotic for the infection. To help with the nasal congestion you could use you nasal steroid(if tolerated). For your associated cough, you can continue your cough syrup at home.  Note the doxycycline may help with your nostril as well.(compared to other antibiotics)

## 2014-08-03 NOTE — Progress Notes (Signed)
Pre visit review using our clinic review tool, if applicable. No additional management support is needed unless otherwise documented below in the visit note. 

## 2014-09-18 ENCOUNTER — Ambulatory Visit (INDEPENDENT_AMBULATORY_CARE_PROVIDER_SITE_OTHER): Payer: PPO | Admitting: Family Medicine

## 2014-09-18 ENCOUNTER — Encounter: Payer: Self-pay | Admitting: Family Medicine

## 2014-09-18 VITALS — BP 120/76 | HR 88 | Temp 97.8°F | Ht 63.0 in | Wt 155.1 lb

## 2014-09-18 DIAGNOSIS — E1140 Type 2 diabetes mellitus with diabetic neuropathy, unspecified: Secondary | ICD-10-CM

## 2014-09-18 DIAGNOSIS — E1149 Type 2 diabetes mellitus with other diabetic neurological complication: Secondary | ICD-10-CM

## 2014-09-18 DIAGNOSIS — J019 Acute sinusitis, unspecified: Secondary | ICD-10-CM

## 2014-09-18 DIAGNOSIS — K219 Gastro-esophageal reflux disease without esophagitis: Secondary | ICD-10-CM

## 2014-09-18 DIAGNOSIS — E039 Hypothyroidism, unspecified: Secondary | ICD-10-CM

## 2014-09-18 DIAGNOSIS — I10 Essential (primary) hypertension: Secondary | ICD-10-CM

## 2014-09-18 DIAGNOSIS — M19049 Primary osteoarthritis, unspecified hand: Secondary | ICD-10-CM

## 2014-09-18 DIAGNOSIS — M1990 Unspecified osteoarthritis, unspecified site: Secondary | ICD-10-CM

## 2014-09-18 DIAGNOSIS — E038 Other specified hypothyroidism: Secondary | ICD-10-CM

## 2014-09-18 DIAGNOSIS — M25521 Pain in right elbow: Secondary | ICD-10-CM

## 2014-09-18 DIAGNOSIS — E785 Hyperlipidemia, unspecified: Secondary | ICD-10-CM

## 2014-09-18 DIAGNOSIS — M79641 Pain in right hand: Secondary | ICD-10-CM

## 2014-09-18 LAB — CBC
HCT: 43.3 % (ref 36.0–46.0)
HEMOGLOBIN: 14.8 g/dL (ref 12.0–15.0)
MCHC: 34.1 g/dL (ref 30.0–36.0)
MCV: 92.1 fl (ref 78.0–100.0)
Platelets: 293 10*3/uL (ref 150.0–400.0)
RBC: 4.7 Mil/uL (ref 3.87–5.11)
RDW: 13.5 % (ref 11.5–15.5)
WBC: 7.7 10*3/uL (ref 4.0–10.5)

## 2014-09-18 LAB — COMPREHENSIVE METABOLIC PANEL
ALT: 23 U/L (ref 0–35)
AST: 18 U/L (ref 0–37)
Albumin: 4.3 g/dL (ref 3.5–5.2)
Alkaline Phosphatase: 53 U/L (ref 39–117)
BUN: 19 mg/dL (ref 6–23)
CALCIUM: 9.9 mg/dL (ref 8.4–10.5)
CO2: 28 mEq/L (ref 19–32)
CREATININE: 0.92 mg/dL (ref 0.40–1.20)
Chloride: 106 mEq/L (ref 96–112)
GFR: 65.03 mL/min (ref >60.00)
Glucose, Bld: 121 mg/dL — ABNORMAL HIGH (ref 70–99)
Potassium: 3.8 mEq/L (ref 3.5–5.1)
Sodium: 139 mEq/L (ref 135–145)
Total Bilirubin: 0.4 mg/dL (ref 0.2–1.2)
Total Protein: 7.4 g/dL (ref 6.0–8.3)

## 2014-09-18 LAB — LIPID PANEL
Cholesterol: 197 mg/dL (ref 0–200)
HDL: 46.1 mg/dL (ref >39.00)
NONHDL: 150.9
Total CHOL/HDL Ratio: 4
Triglycerides: 332 mg/dL — ABNORMAL HIGH (ref 0.0–149.0)
VLDL: 66.4 mg/dL — AB (ref 0.0–40.0)

## 2014-09-18 LAB — LDL CHOLESTEROL, DIRECT: LDL DIRECT: 123 mg/dL

## 2014-09-18 LAB — TSH: TSH: 3.15 u[IU]/mL (ref 0.35–4.50)

## 2014-09-18 LAB — HEMOGLOBIN A1C: Hgb A1c MFr Bld: 6.9 % — ABNORMAL HIGH (ref 4.6–6.5)

## 2014-09-18 MED ORDER — SULFAMETHOXAZOLE-TRIMETHOPRIM 800-160 MG PO TABS: 1.0000 | ORAL_TABLET | Freq: Two times a day (BID) | ORAL | Status: DC

## 2014-09-18 MED ORDER — MUPIROCIN 2 % EX OINT: 1.0000 "application " | TOPICAL_OINTMENT | Freq: Every evening | CUTANEOUS | Status: DC | PRN

## 2014-09-18 NOTE — Patient Instructions (Signed)
Probiotic such as Digestive Advantage or [facility]  Basic Carbohydrate Counting for Diabetes Mellitus Carbohydrate counting is a method for keeping track of the amount of carbohydrates you eat. Eating carbohydrates naturally increases the level of sugar (glucose) in your blood, so it is important for you to know the amount that is okay for you to have in every meal. Carbohydrate counting helps keep the level of glucose in your blood within normal limits. The amount of carbohydrates allowed is different for every person. A dietitian can help you calculate the amount that is right for you. Once you know the amount of carbohydrates you can have, you can count the carbohydrates in the foods you want to eat. Carbohydrates are found in the following foods:  Grains, such as breads and cereals.  Dried beans and soy products.  Starchy vegetables, such as potatoes, peas, and corn.  Fruit and fruit juices.  Milk and yogurt.  Sweets and snack foods, such as cake, cookies, candy, chips, soft drinks, and fruit drinks. CARBOHYDRATE COUNTING There are two ways to count the carbohydrates in your food. You can use either of the methods or a combination of both. Reading the "Nutrition Facts" on [place] The "Nutrition Facts" is an area that is included on the labels of almost all packaged food and beverages in the [place]. It includes the serving size of that food or beverage and information about the nutrients in each serving of the food, including the grams (g) of carbohydrate per serving.  Decide the number of servings of this food or beverage that you will be able to eat or drink. Multiply that number of servings by the number of grams of carbohydrate that is listed on the label for that serving. The total will be the amount of carbohydrates you will be having when you eat or drink this food or beverage. Learning Standard Serving Sizes of Food When you eat food that is not packaged  or does not include "Nutrition Facts" on the label, you need to measure the servings in order to count the amount of carbohydrates.A serving of most carbohydrate-rich foods contains about 15 g of carbohydrates. The following list includes serving sizes of carbohydrate-rich foods that provide 15 g ofcarbohydrate per serving:   1 slice of bread (1 oz) or 1 six-inch tortilla.    of a hamburger bun or English muffin.  4-6 crackers.   cup unsweetened dry cereal.    cup hot cereal.   cup rice or pasta.    cup mashed potatoes or  of a large baked potato.  1 cup fresh fruit or one small piece of fruit.    cup canned or frozen fruit or fruit juice.  1 cup milk.   cup plain fat-free yogurt or yogurt sweetened with artificial sweeteners.   cup cooked dried beans or starchy vegetable, such as peas, corn, or potatoes.  Decide the number of standard-size servings that you will eat. Multiply that number of servings by 15 (the grams of carbohydrates in that serving). For example, if you eat 2 cups of strawberries, you will have eaten 2 servings and 30 g of carbohydrates (2 servings x 15 g = 30 g). For foods such as soups and casseroles, in which more than one food is mixed in, you will need to count the carbohydrates in each food that is included. EXAMPLE OF CARBOHYDRATE COUNTING Sample Dinner  3 oz chicken breast.   cup of brown rice.   cup of corn.  1 cup milk.   1 cup strawberries with sugar-free whipped topping.  Carbohydrate Calculation Step 1: Identify the foods that contain carbohydrates:   Rice.   Corn.   Milk.   Strawberries. Step 2:Calculate the number of servings eaten of each:   2 servings of rice.   1 serving of corn.   1 serving of milk.   1 serving of strawberries. Step 3: Multiply each of those number of servings by 15 g:   2 servings of rice x 15 g = 30 g.   1 serving of corn x 15 g = 15 g.   1 serving of milk x 15 g = 15  g.   1 serving of strawberries x 15 g = 15 g. Step 4: Add together all of the amounts to find the total grams of carbohydrates eaten: 30 g + 15 g + 15 g + 15 g = 75 g. Document Released: 06/30/2005 Document Revised: 11/14/2013 Document Reviewed: 05/27/2013 [facility] Patient Information 2015 [place], [place]. This information is not intended to replace advice given to you by your health care provider. Make sure you discuss any questions you have with your health care provider.

## 2014-09-18 NOTE — Assessment & Plan Note (Signed)
Well controlled, no changes to meds. Encouraged heart healthy diet such as the DASH diet and exercise as tolerated.  °

## 2014-09-18 NOTE — Progress Notes (Signed)
Pre visit review using our clinic review tool, if applicable. No additional management support is needed unless otherwise documented below in the visit note. 

## 2014-09-22 ENCOUNTER — Ambulatory Visit (INDEPENDENT_AMBULATORY_CARE_PROVIDER_SITE_OTHER): Payer: PPO | Admitting: Family Medicine

## 2014-09-22 DIAGNOSIS — M1990 Unspecified osteoarthritis, unspecified site: Secondary | ICD-10-CM | POA: Diagnosis not present

## 2014-09-22 DIAGNOSIS — M19049 Primary osteoarthritis, unspecified hand: Secondary | ICD-10-CM

## 2014-09-22 DIAGNOSIS — M7521 Bicipital tendinitis, right shoulder: Secondary | ICD-10-CM | POA: Diagnosis not present

## 2014-09-22 MED ORDER — METHYLPREDNISOLONE ACETATE 40 MG/ML IJ SUSP
40.0000 mg | Freq: Once | INTRAMUSCULAR | Status: AC
  Administered 2014-09-22: 20 mg via INTRA_ARTICULAR

## 2014-09-22 NOTE — Patient Instructions (Signed)
We will refer you to a hand surgeon to discuss options these. You were given an injection today.  Your have right distal biceps tendinitis. An elbow sleeve may help. Icing as needed 15 minutes at a time. Curls and hammer exercise we discussed 3 sets of 10 once a day when your thumb feels better. Physical therapy is an option. Ibuprofen or aleve for pain and inflammation.

## 2014-09-24 NOTE — Assessment & Plan Note (Signed)
hgba1c acceptable, minimize simple carbs. Increase exercise as tolerated. Continue current meds 

## 2014-09-24 NOTE — Assessment & Plan Note (Signed)
Avoid offending foods, start probiotics. Do not eat large meals in late evening and consider raising head of bed.  

## 2014-09-24 NOTE — Assessment & Plan Note (Signed)
Tolerating statin, encouraged heart healthy diet, avoid trans fats, minimize simple carbs and saturated fats. Increase exercise as tolerated 

## 2014-09-24 NOTE — Assessment & Plan Note (Signed)
Is established with sports med. May try topical treatments prn

## 2014-09-24 NOTE — Progress Notes (Signed)
Crystal [name]  [id number] September 19, 1950 09/24/2014      Progress Note-Follow Up  Subjective  Chief Complaint  Chief Complaint  Patient presents with  . Follow-up    HPI  Patient is a 64 y.o. female in today for routine medical care. Patient is in today for follow-up. No acute complaints noted. No recent illness. Continues to struggle with chronic pain including in her shoulders and back. Manageable at the present time. Denies CP/palp/SOB/HA/congestion/fevers/GI or GU c/o. Taking meds as prescribed. No polyuria or polydipsia  Past Medical History  Diagnosis Date  . Hypertension   . Hyperlipemia   . Dyspepsia   . OSA (obstructive sleep apnea)   . Plantar fasciitis   . Depression   . Asthma     02/08/10 FEV1 1.98 (80%), s/p saba 2.22 l/m (90%).nml  . Anxiety   . 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 Fatty liver 12/19/2010  . Internal hemorrhoids   . Diverticulosis   . Allergy     dust, cock roach,grass,weeds,molds  . GERD (gastroesophageal reflux disease)   . Acute peptic ulcer of stomach     As a teenager  . IBS (irritable bowel syndrome)   . Cataract     Past Surgical History  Procedure Laterality Date  . Knee arthroscopy Left 1998  . Toe surgery Right 1995  . Nasal septum surgery    . Rhinoplasty    . Appendectomy      age 52  . Colonoscopy  06/03/2010, 08/25/2011    diverticulosis   . Gastrectomy      partial - ulcer as a teen    Family History  Problem Relation Age of Onset  . Diabetes Mother     type II  . Heart attack Mother 34  . Ulcers      bleeding gastric  . Heart defect      child  . Other      perforated bowel, child  . Colon cancer Sister   . Cancer Sister     colon cancer  . Asthma Maternal Grandmother   . Mental retardation Son     PTSD  . Cancer Sister     stage 4 bladder cancer    History   Social History  . Marital Status: Married    Spouse Name: [name]  . Number of Children: 4  . Years of Education: N/A   Occupational  History  . 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 Smoking status: Never Smoker   . Smokeless tobacco: Never Used  . Alcohol Use: No  . Drug Use: No  . Sexual Activity: No     Comment: lives with her mother, no dietary restrictions   Other Topics Concern  . Not on file   Social History Narrative   No caffeine drinks daily     Current Outpatient Prescriptions on File Prior to Visit  Medication Sig Dispense Refill  . ACCU-CHEK FASTCLIX LANCETS MISC Check Blood Glucose bid  DX 250.81 102 each 1  . albuterol (PROAIR HFA) 108 (90 BASE) MCG/ACT inhaler Inhale 2 puffs into the lungs every 4 (four) hours as needed. Shortness of breath and wheezing 18 g 0  . alosetron (LOTRONEX) 1 MG tablet Take 1 tablet (1 mg total) by mouth 2 (two) times daily. And as directed 180 tablet 3  . aspirin 81 MG tablet Take 81 mg by mouth daily.    Marland Kitchen atorvastatin (LIPITOR) 80 MG tablet  TAKE 1 TABLET (80 MG TOTAL) BY MOUTH DAILY. 90 tablet 0  . beclomethasone (QVAR) 40 MCG/ACT inhaler Inhale 2 puffs into the lungs 2 (two) times daily. 1 Inhaler 12  . Blood Glucose Monitoring Suppl (ACCU-CHEK AVIVA PLUS) W/DEVICE KIT 1 kit by Does not apply route daily. DX 250.00 1 kit 0  . clotrimazole-betamethasone (LOTRISONE) cream     . dicyclomine (BENTYL) 20 MG tablet Take 1 tablet (20 mg total) by mouth every 6 (six) hours as needed (cramps/diarrhea). 40 tablet 5  . escitalopram (LEXAPRO) 10 MG tablet TAKE 1 TABLET (10 MG TOTAL) BY MOUTH DAILY. 90 tablet 1  . fenofibrate 160 MG tablet TAKE 1 TABLET (160 MG TOTAL) BY MOUTH AT BEDTIME. 90 tablet 0  . fluticasone (FLONASE) 50 MCG/ACT nasal spray Place 1 spray into both nostrils daily. 16 g 0  . glimepiride (AMARYL) 2 MG tablet Take 1 tablet (2 mg total) by mouth daily with breakfast. 30 tablet 3  . glucose blood (ACCU-CHEK AVIVA PLUS) test strip TEST BLOOD SUGARS 1 TO 2 TIMES A DAY. Dx 250.00 180 each 1  . INVOKANA 100 MG TABS TAKE 1 TABLET (100 MG TOTAL) BY MOUTH  DAILY. 90 tablet 1  . lisinopril (PRINIVIL,ZESTRIL) 2.5 MG tablet TAKE 1 TABLET (2.5 MG TOTAL) BY MOUTH DAILY. 90 tablet 1  . mometasone (NASONEX) 50 MCG/ACT nasal spray Place 2 sprays into the nose daily as needed. 17 g 6  . Multiple Vitamins-Minerals (CENTRUM SILVER PO) Take 1 tablet by mouth daily.      . nitroGLYCERIN (NITRODUR - DOSED IN MG/24 HR) 0.2 mg/hr patch     . ondansetron (ZOFRAN) 4 MG tablet Take 1 tablet (4 mg total) by mouth every 8 (eight) hours as needed for nausea. 60 tablet 1  . pantoprazole (PROTONIX) 40 MG tablet TAKE 1 TABLET (40 MG TOTAL) BY MOUTH DAILY. 90 tablet 1  . Probiotic Product (ALIGN) 4 MG CAPS Take 1 capsule by mouth daily.      . sertraline (ZOLOFT) 50 MG tablet Take 1 tablet (50 mg total) by mouth daily. 90 tablet 1  . sitaGLIPtin (JANUVIA) 100 MG tablet Take 1 tablet (100 mg total) by mouth daily. 90 tablet 0  . valACYclovir (VALTREX) 1000 MG tablet Take 1 tablet (1,000 mg total) by mouth 2 (two) times daily. 20 tablet 1  . valACYclovir (VALTREX) 500 MG tablet TAKE 1 TABLET (500 MG TOTAL) BY MOUTH DAILY. 90 tablet 0   No current facility-administered medications on file prior to visit.    Allergies  Allergen Reactions  . Compazine [Prochlorperazine Edisylate]   . Prochlorperazine Edisylate Other (See Comments)    coma for 3 days  . Tetanus Toxoid Other (See Comments)    convulsions    Review of Systems  Review of Systems  Constitutional: Negative for fever and malaise/fatigue.  HENT: Negative for congestion.   Eyes: Negative for discharge.  Respiratory: Negative for shortness of breath.   Cardiovascular: Negative for chest pain, palpitations and leg swelling.  Gastrointestinal: Negative for nausea, abdominal pain and diarrhea.  Genitourinary: Negative for dysuria.  Musculoskeletal: Positive for joint pain. Negative for falls.  Skin: Negative for rash.  Neurological: Negative for loss of consciousness and headaches.  Endo/Heme/Allergies:  Negative for polydipsia.  Psychiatric/Behavioral: Negative for depression and suicidal ideas. The patient is not nervous/anxious and does not have insomnia.     Objective  BP 120/76 mmHg  Pulse 88  Temp(Src) 97.8 F (36.6 C) (Oral)  Ht '5\' 3"'  (  1.6 m)  Wt 155 lb 2 oz (70.364 kg)  BMI 27.49 kg/m2  SpO2 94%  Physical Exam  Physical Exam  Constitutional: She is oriented to person, place, and time and well-developed, well-nourished, and in no distress. No distress.  HENT:  Head: Normocephalic and atraumatic.  Eyes: Conjunctivae are normal.  Neck: Neck supple. No thyromegaly present.  Cardiovascular: Normal rate, regular rhythm and normal heart sounds.   No murmur heard. Pulmonary/Chest: Effort normal and breath sounds normal. She has no wheezes.  Abdominal: She exhibits no distension and no mass.  Musculoskeletal: She exhibits no edema.  Lymphadenopathy:    She has no cervical adenopathy.  Neurological: She is alert and oriented to person, place, and time.  Skin: Skin is warm and dry. No rash noted. She is not diaphoretic.  Psychiatric: Memory, affect and judgment normal.    Lab Results  Component Value Date   TSH 3.15 09/18/2014   Lab Results  Component Value Date   WBC 7.7 09/18/2014   HGB 14.8 09/18/2014   HCT 43.3 09/18/2014   MCV 92.1 09/18/2014   PLT 293.0 09/18/2014   Lab Results  Component Value Date   CREATININE 0.92 09/18/2014   BUN 19 09/18/2014   NA 139 09/18/2014   K 3.8 09/18/2014   CL 106 09/18/2014   CO2 28 09/18/2014   Lab Results  Component Value Date   ALT 23 09/18/2014   AST 18 09/18/2014   ALKPHOS 53 09/18/2014   BILITOT 0.4 09/18/2014   Lab Results  Component Value Date   CHOL 197 09/18/2014   Lab Results  Component Value Date   HDL 46.10 09/18/2014   Lab Results  Component Value Date   LDLCALC 81 06/19/2014   Lab Results  Component Value Date   TRIG 332.0* 09/18/2014   Lab Results  Component Value Date   CHOLHDL 4  09/18/2014     Assessment & Plan  Essential hypertension Well controlled, no changes to meds. Encouraged heart healthy diet such as the DASH diet and exercise as tolerated.    GERD (gastroesophageal reflux disease) Avoid offending foods, start probiotics. Do not eat large meals in late evening and consider raising head of bed.    DM (diabetes mellitus), type 2 with neurological complications [id number] acceptable, minimize simple carbs. Increase exercise as tolerated. 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 May try topical treatments prn   Hyperlipidemia Tolerating statin, encouraged heart healthy diet, avoid trans fats, minimize simple carbs and saturated fats. Increase exercise as tolerated.

## 2014-09-26 DIAGNOSIS — M7521 Bicipital tendinitis, right shoulder: Secondary | ICD-10-CM | POA: Insufficient documentation

## 2014-09-26 NOTE — Assessment & Plan Note (Signed)
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  Encouraged bracing.  Tylenol, nsaids, glucosamine, capsaicin as discussed previously.  Will refer to hand surgery to talk about operative options given repeat injections over past few years not providing lasting relief even since she has retired.  After informed written consent patient was seated in chair in exam room.  Ultrasound used to identify joint space.  Alcohol swab used for prep then right 1st CMC injected with 0.5:0.5 marcaine: depomedrol.  Patient tolerated procedure well without immediate complications.

## 2014-09-26 NOTE — Progress Notes (Signed)
Patient ID: Crystal [name], female   DOB: [birth year], 64 y.o.   MRN: [id number]  PCP: Debbrah Alar  Hand Pain    64 yo F here for f/u bilateral thumb pain.  4/3: Patient reports for about a year she has had pain at base of her thumbs. Has really intensified past few months, now very difficult to function. Pain radiates into thumb past MCP and into index finger. No numbness/tingling. Taking tylenol and using activeon. Works at a computer 8 hours a day, lots of typing. No prior treatment for this.  Not bracing.  5/6: Patient returns stating right thumb is much improved after injection last visit. Here for similar shot in left thumb as this one has worsened.  03/04/13: Patient returns as left and right thumb bases bothering her again. Has brace only for right but not currently using. Injections in both sides have lasted until past few weeks. Not icing or taking any medicines now.  10/25/13: Patient returns for right thumb pain primarily - would like repeat injection. Left side is doing ok. Has been using braces. Not taking any medicines regularly for pain. Continues with high dexterity job.  10/29: Patient is now retired and thumbs have been improved. However in past couple weeks right CMC joint acting up again. Increased swelling, tender to touch. Would like repeat injection. Has braces - using occasionally.  09/22/14: Patient returns as right thumb pain is severe again up to 10/10 level of pain. Very tender to touch, swollen. Using thumb spica brace at times. Also with anterior right elbow pain without injury. Worse when carrying items. No swelling or bruising of elbow and no injury here.  Past Medical History  Diagnosis Date  . Hypertension   . Hyperlipemia   . Dyspepsia   . OSA (obstructive sleep apnea)   . Plantar fasciitis   . Depression   . Asthma     02/08/10 FEV1 1.98 (80%), s/p saba 2.22 l/m (90%).nml  . Anxiety   . Diabetes mellitus 2003   Type II  . Fatty liver 12/19/2010  . Internal hemorrhoids   . Diverticulosis   . Allergy     dust, cock roach,grass,weeds,molds  . GERD (gastroesophageal reflux disease)   . Acute peptic ulcer of stomach     As a teenager  . IBS (irritable bowel syndrome)   . Cataract     Current Outpatient Prescriptions on File Prior to Visit  Medication Sig Dispense Refill  . ACCU-CHEK FASTCLIX LANCETS MISC Check Blood Glucose bid  DX 250.81 102 each 1  . albuterol (PROAIR HFA) 108 (90 BASE) MCG/ACT inhaler Inhale 2 puffs into the lungs every 4 (four) hours as needed. Shortness of breath and wheezing 18 g 0  . alosetron (LOTRONEX) 1 MG tablet Take 1 tablet (1 mg total) by mouth 2 (two) times daily. And as directed 180 tablet 3  . aspirin 81 MG tablet Take 81 mg by mouth daily.    Marland Kitchen atorvastatin (LIPITOR) 80 MG tablet TAKE 1 TABLET (80 MG TOTAL) BY MOUTH DAILY. 90 tablet 0  . beclomethasone (QVAR) 40 MCG/ACT inhaler Inhale 2 puffs into the lungs 2 (two) times daily. 1 Inhaler 12  . Blood Glucose Monitoring Suppl (ACCU-CHEK AVIVA PLUS) W/DEVICE KIT 1 kit by Does not apply route daily. DX 250.00 1 kit 0  . clotrimazole-betamethasone (LOTRISONE) cream     . dicyclomine (BENTYL) 20 MG tablet Take 1 tablet (20 mg total) by mouth every 6 (six) hours as needed (cramps/diarrhea). [place]  tablet 5  . escitalopram (LEXAPRO) 10 MG tablet TAKE 1 TABLET (10 MG TOTAL) BY MOUTH DAILY. 90 tablet 1  . fenofibrate 160 MG tablet TAKE 1 TABLET (160 MG TOTAL) BY MOUTH AT BEDTIME. 90 tablet 0  . fluticasone (FLONASE) 50 MCG/ACT nasal spray Place 1 spray into both nostrils daily. 16 g 0  . glimepiride (AMARYL) 2 MG tablet Take 1 tablet (2 mg total) by mouth daily with breakfast. 30 tablet 3  . glucose blood (ACCU-CHEK AVIVA PLUS) test strip TEST BLOOD SUGARS 1 TO 2 TIMES A DAY. Dx 250.00 180 each 1  . INVOKANA 100 MG TABS TAKE 1 TABLET (100 MG TOTAL) BY MOUTH DAILY. 90 tablet 1  . lisinopril (PRINIVIL,ZESTRIL) 2.5 MG tablet TAKE  1 TABLET (2.5 MG TOTAL) BY MOUTH DAILY. 90 tablet 1  . mometasone (NASONEX) 50 MCG/ACT nasal spray Place 2 sprays into the nose daily as needed. 17 g 6  . Multiple Vitamins-Minerals (CENTRUM SILVER PO) Take 1 tablet by mouth daily.      . mupirocin ointment (BACTROBAN) 2 % Place 1 application into the nose at bedtime as needed. 22 g 0  . nitroGLYCERIN (NITRODUR - DOSED IN MG/24 HR) 0.2 mg/hr patch     . ondansetron (ZOFRAN) 4 MG tablet Take 1 tablet (4 mg total) by mouth every 8 (eight) hours as needed for nausea. 60 tablet 1  . pantoprazole (PROTONIX) 40 MG tablet TAKE 1 TABLET (40 MG TOTAL) BY MOUTH DAILY. 90 tablet 1  . Probiotic Product (ALIGN) 4 MG CAPS Take 1 capsule by mouth daily.      . sertraline (ZOLOFT) 50 MG tablet Take 1 tablet (50 mg total) by mouth daily. 90 tablet 1  . sitaGLIPtin (JANUVIA) 100 MG tablet Take 1 tablet (100 mg total) by mouth daily. 90 tablet 0  . sulfamethoxazole-trimethoprim (BACTRIM DS,SEPTRA DS) 800-160 MG per tablet Take 1 tablet by mouth 2 (two) times daily. 20 tablet 0  . valACYclovir (VALTREX) 1000 MG tablet Take 1 tablet (1,000 mg total) by mouth 2 (two) times daily. 20 tablet 1  . valACYclovir (VALTREX) 500 MG tablet TAKE 1 TABLET (500 MG TOTAL) BY MOUTH DAILY. 90 tablet 0   No current facility-administered medications on file prior to visit.    Past Surgical History  Procedure Laterality Date  . Knee arthroscopy Left 1998  . Toe surgery Right 1995  . Nasal septum surgery    . Rhinoplasty    . Appendectomy      age 34  . Colonoscopy  06/03/2010, 08/25/2011    diverticulosis   . Gastrectomy      partial - ulcer as a teen    Allergies  Allergen Reactions  . Compazine [Prochlorperazine Edisylate]   . Prochlorperazine Edisylate Other (See Comments)    coma for 3 days  . Tetanus Toxoid Other (See Comments)    convulsions    History   Social History  . Marital Status: Married    Spouse Name: [name]  . Number of Children: 4  . Years of  Education: N/A   Occupational History  . 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 Smoking status: Never Smoker   . Smokeless tobacco: Never Used  . Alcohol Use: No  . Drug Use: No  . Sexual Activity: No     Comment: lives with her mother, no dietary restrictions   Other Topics Concern  . Not on file   Social History Narrative   No caffeine  drinks daily     Family History  Problem Relation Age of Onset  . Diabetes Mother     type II  . Heart attack Mother 81  . Ulcers      bleeding gastric  . Heart defect      child  . Other      perforated bowel, child  . Colon cancer Sister   . Cancer Sister     colon cancer  . Asthma Maternal Grandmother   . Mental retardation Son     PTSD  . Cancer Sister     stage 4 bladder cancer    There were no vitals taken for this visit.  Review of Systems See HPI above.    Objective:   Physical Exam Gen: NAD  R hand: No gross deformity, swelling, bruising. Severe TTP 1st CMC joint.  No 1st dorsal compartment or carpal tunnel tenderness.  No other hand, wrist TTP. Decreased ROM all planes of 1st CMC.  FROM MCP and IP of thumb. Collateral ligaments intact. Negative finkelsteins. NVI distally.  Right elbow: No gross deformity, swelling, bruising. Mild TTP distal biceps tendon.  No other tenderness. FROM with minimal pain on resisted elbow flexion. NVI distally. Collateral ligaments intact.    Assessment & Plan:  1. 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  Encouraged bracing.  Tylenol, nsaids, glucosamine, capsaicin as discussed previously.  Will refer to hand surgery to talk about operative options given repeat injections over past few years not providing lasting relief even since she has retired.  After informed written consent patient was seated in chair in exam room.  Ultrasound used to identify joint space.  Alcohol swab used for prep then right 1st CMC injected with  0.5:0.5 marcaine: depomedrol.  Patient tolerated procedure well without immediate complications.  2. Right distal biceps tendinitis - Shown home exercises to do daily.  Icing, elbow sleeve.  NSAIDs as needed.  Physical therapy is a consideration.

## 2014-09-27 NOTE — Addendum Note (Signed)
Addended by: Sherrie George F on: 09/27/2014 02:15 PM   Modules accepted: Orders

## 2014-09-28 ENCOUNTER — Ambulatory Visit (HOSPITAL_BASED_OUTPATIENT_CLINIC_OR_DEPARTMENT_OTHER)
Admission: RE | Admit: 2014-09-28 | Discharge: 2014-09-28 | Disposition: A | Discharge status: Home / Self Care | Payer: PPO | Source: Ambulatory Visit | Attending: Family Medicine | Admitting: Family Medicine

## 2014-09-28 DIAGNOSIS — M19041 Primary osteoarthritis, right hand: Secondary | ICD-10-CM | POA: Insufficient documentation

## 2014-09-28 DIAGNOSIS — M79644 Pain in right finger(s): Secondary | ICD-10-CM | POA: Diagnosis present

## 2014-09-28 DIAGNOSIS — M19049 Primary osteoarthritis, unspecified hand: Secondary | ICD-10-CM

## 2014-10-16 ENCOUNTER — Other Ambulatory Visit: Payer: Self-pay | Admitting: Family Medicine

## 2014-11-07 ENCOUNTER — Other Ambulatory Visit: Payer: Self-pay | Admitting: Family Medicine

## 2014-11-13 ENCOUNTER — Other Ambulatory Visit: Payer: Self-pay | Admitting: Family Medicine

## 2014-11-23 ENCOUNTER — Telehealth: Payer: Self-pay | Admitting: Family Medicine

## 2014-11-23 NOTE — Telephone Encounter (Signed)
She can have a prescription for Diazepam 5 mg tabs sig: 1/2 to 1 tab po bid prn anxiety/insomnia. Disp #30.   No refill

## 2014-11-23 NOTE — Telephone Encounter (Signed)
Mother is dying and is very stressed, is requesting a low does valium, please advise.

## 2014-11-24 MED ORDER — DIAZEPAM 5 MG PO TABS: ORAL_TABLET | ORAL | Status: DC

## 2014-11-24 NOTE — Telephone Encounter (Signed)
Script printed and given to [facility] to have Dr. Charlett Blake sign. JG//CMA

## 2014-11-24 NOTE — Telephone Encounter (Signed)
Spoke with patient and advised. Faxed to [place] per patient request.

## 2014-12-07 ENCOUNTER — Other Ambulatory Visit: Payer: Self-pay | Admitting: Family Medicine

## 2014-12-07 ENCOUNTER — Telehealth: Payer: Self-pay | Admitting: Family Medicine

## 2014-12-07 MED ORDER — VALACYCLOVIR HCL 1 G PO TABS: 1000.0000 mg | ORAL_TABLET | Freq: Two times a day (BID) | ORAL | Status: DC

## 2014-12-07 NOTE — Telephone Encounter (Signed)
Patient informed requested prescription has been sent in.

## 2014-12-07 NOTE — Telephone Encounter (Signed)
Caller name: Shaneika Relation to pt: self Call back number: Pharmacy: [facility]  Reason for call:   Patient is requesting valtrex refill

## 2015-01-08 ENCOUNTER — Emergency Department (HOSPITAL_BASED_OUTPATIENT_CLINIC_OR_DEPARTMENT_OTHER)
Admission: EM | Admit: 2015-01-08 | Discharge: 2015-01-08 | Disposition: A | Discharge status: Home / Self Care | Payer: PPO | Attending: Emergency Medicine | Admitting: Emergency Medicine

## 2015-01-08 ENCOUNTER — Encounter (HOSPITAL_BASED_OUTPATIENT_CLINIC_OR_DEPARTMENT_OTHER): Payer: Self-pay | Admitting: *Deleted

## 2015-01-08 DIAGNOSIS — J45909 Unspecified asthma, uncomplicated: Secondary | ICD-10-CM | POA: Insufficient documentation

## 2015-01-08 DIAGNOSIS — Z8711 Personal history of peptic ulcer disease: Secondary | ICD-10-CM | POA: Diagnosis not present

## 2015-01-08 DIAGNOSIS — E1165 Type 2 diabetes mellitus with hyperglycemia: Secondary | ICD-10-CM | POA: Insufficient documentation

## 2015-01-08 DIAGNOSIS — Z7982 Long term (current) use of aspirin: Secondary | ICD-10-CM | POA: Diagnosis not present

## 2015-01-08 DIAGNOSIS — F419 Anxiety disorder, unspecified: Secondary | ICD-10-CM | POA: Diagnosis not present

## 2015-01-08 DIAGNOSIS — H269 Unspecified cataract: Secondary | ICD-10-CM | POA: Insufficient documentation

## 2015-01-08 DIAGNOSIS — I10 Essential (primary) hypertension: Secondary | ICD-10-CM | POA: Diagnosis not present

## 2015-01-08 DIAGNOSIS — E785 Hyperlipidemia, unspecified: Secondary | ICD-10-CM | POA: Diagnosis not present

## 2015-01-08 DIAGNOSIS — K219 Gastro-esophageal reflux disease without esophagitis: Secondary | ICD-10-CM | POA: Diagnosis not present

## 2015-01-08 DIAGNOSIS — F329 Major depressive disorder, single episode, unspecified: Secondary | ICD-10-CM | POA: Insufficient documentation

## 2015-01-08 DIAGNOSIS — M79644 Pain in right finger(s): Secondary | ICD-10-CM | POA: Insufficient documentation

## 2015-01-08 DIAGNOSIS — R200 Anesthesia of skin: Secondary | ICD-10-CM | POA: Insufficient documentation

## 2015-01-08 DIAGNOSIS — Z79899 Other long term (current) drug therapy: Secondary | ICD-10-CM | POA: Diagnosis not present

## 2015-01-08 DIAGNOSIS — R739 Hyperglycemia, unspecified: Secondary | ICD-10-CM

## 2015-01-08 DIAGNOSIS — Z8719 Personal history of other diseases of the digestive system: Secondary | ICD-10-CM | POA: Insufficient documentation

## 2015-01-08 DIAGNOSIS — Z7951 Long term (current) use of inhaled steroids: Secondary | ICD-10-CM | POA: Insufficient documentation

## 2015-01-08 HISTORY — DX: Pure hypercholesterolemia, unspecified: E78.00

## 2015-01-08 LAB — BASIC METABOLIC PANEL
ANION GAP: 7 (ref 5–15)
BUN: 16 mg/dL (ref 6–20)
CHLORIDE: 108 mmol/L (ref 101–111)
CO2: 25 mmol/L (ref 22–32)
CREATININE: 0.61 mg/dL (ref 0.44–1.00)
Calcium: 9.7 mg/dL (ref 8.9–10.3)
GFR calc Af Amer: >60 mL/min (ref >60)
GFR calc non Af Amer: >60 mL/min (ref >60)
Glucose, Bld: 137 mg/dL — ABNORMAL HIGH (ref 65–99)
Potassium: 3.5 mmol/L (ref 3.5–5.1)
Sodium: 140 mmol/L (ref 135–145)

## 2015-01-08 LAB — CBG MONITORING, ED: GLUCOSE-CAPILLARY: 232 mg/dL — AB (ref 65–99)

## 2015-01-08 NOTE — Discharge Instructions (Signed)
Hyperglycemia Take all of your medications as prescribed. Call Dr.Blythe's office to arrange to get your blood pressure rechecked within the next 3 weeks. Today's was mildly elevated at 148/92. Hyperglycemia occurs when the glucose (sugar) in your blood is too high. Hyperglycemia can happen for many reasons, but it most often happens to people who do not know they have diabetes or are not managing their diabetes properly.  CAUSES  Whether you have diabetes or not, there are other causes of hyperglycemia. Hyperglycemia can occur when you have diabetes, but it can also occur in other situations that you might not be as aware of, such as: Diabetes  If you have diabetes and are having problems controlling your blood glucose, hyperglycemia could occur because of some of the following reasons:  Not following your meal plan.  Not taking your diabetes medications or not taking it properly.  Exercising less or doing less activity than you normally do.  Being sick. Pre-diabetes  This cannot be ignored. Before people develop Type 2 diabetes, they almost always have "pre-diabetes." This is when your blood glucose levels are higher than normal, but not yet high enough to be diagnosed as diabetes. Research has shown that some long-term damage to the body, especially the heart and circulatory system, may already be occurring during pre-diabetes. If you take action to manage your blood glucose when you have pre-diabetes, you may delay or prevent Type 2 diabetes from developing. Stress  If you have diabetes, you may be "diet" controlled or on oral medications or insulin to control your diabetes. However, you may find that your blood glucose is higher than usual in the hospital whether you have diabetes or not. This is often referred to as "stress hyperglycemia." Stress can elevate your blood glucose. This happens because of hormones put out by the body during times of stress. If stress has been the cause of your  high blood glucose, it can be followed regularly by your caregiver. That way he/she can make sure your hyperglycemia does not continue to get worse or progress to diabetes. Steroids  Steroids are medications that act on the infection fighting system (immune system) to block inflammation or infection. One side effect can be a rise in blood glucose. Most people can produce enough extra insulin to allow for this rise, but for those who cannot, steroids make blood glucose levels go even higher. It is not unusual for steroid treatments to "uncover" diabetes that is developing. It is not always possible to determine if the hyperglycemia will go away after the steroids are stopped. A special blood test called an A1c is sometimes done to determine if your blood glucose was elevated before the steroids were started. SYMPTOMS  Thirsty.  Frequent urination.  Dry mouth.  Blurred vision.  Tired or fatigue.  Weakness.  Sleepy.  Tingling in feet or leg. DIAGNOSIS  Diagnosis is made by monitoring blood glucose in one or all of the following ways:  A1c test. This is a chemical found in your blood.  Fingerstick blood glucose monitoring.  Laboratory results. TREATMENT  First, knowing the cause of the hyperglycemia is important before the hyperglycemia can be treated. Treatment may include, but is not be limited to:  Education.  Change or adjustment in medications.  Change or adjustment in meal plan.  Treatment for an illness, infection, etc.  More frequent blood glucose monitoring.  Change in exercise plan.  Decreasing or stopping steroids.  Lifestyle changes. HOME CARE INSTRUCTIONS   Test your blood  glucose as directed.  Exercise regularly. Your caregiver will give you instructions about exercise. Pre-diabetes or diabetes which comes on with stress is helped by exercising.  Eat wholesome, balanced meals. Eat often and at regular, fixed times. Your caregiver or nutritionist will  give you a meal plan to guide your sugar intake.  Being at an ideal weight is important. If needed, losing as little as 10 to 15 pounds may help improve blood glucose levels. SEEK MEDICAL CARE IF:   You have questions about medicine, activity, or diet.  You continue to have symptoms (problems such as increased thirst, urination, or weight gain). SEEK IMMEDIATE MEDICAL CARE IF:   You are vomiting or have diarrhea.  Your breath smells fruity.  You are breathing faster or slower.  You are very sleepy or incoherent.  You have numbness, tingling, or pain in your feet or hands.  You have chest pain.  Your symptoms get worse even though you have been following your caregiver's orders.  If you have any other questions or concerns. Document Released: 12/24/2000 Document Revised: 09/22/2011 Document Reviewed: 10/27/2011 [facility] Patient Information 2015 [place], [place]. This information is not intended to replace advice given to you by your health care provider. Make sure you discuss any questions you have with your health care provider.

## 2015-01-08 NOTE — ED Provider Notes (Addendum)
CSN: [id number]     Arrival date & time 01/08/15  1004 History   First MD Initiated Contact with Patient 01/08/15 1034     Chief Complaint  Patient presents with  . Hyperglycemia     (Consider location/radiation/quality/duration/timing/severity/associated sxs/prior Treatment) HPI Patient reports she was feeling tingling around her lips and tingling in both hands this morning and felt confused. She took her blood sugar and it was 354. She feels much improved presently. She does admit to feeling thirsty. No other associated symptoms. No treatment prior to coming here. She's been noncompliant with all of her medications for the past several days because "I didn't get around to it" no other associated symptoms. She did take her medications this morning. Past Medical History  Diagnosis Date  . Hypertension   . Hyperlipemia   . Dyspepsia   . OSA (obstructive sleep apnea)   . Plantar fasciitis   . Depression   . Asthma     02/08/10 FEV1 1.98 (80%), s/p saba 2.22 l/m (90%).nml  . Anxiety   . 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 Fatty liver 12/19/2010  . Internal hemorrhoids   . Diverticulosis   . Allergy     dust, cock roach,grass,weeds,molds  . GERD (gastroesophageal reflux disease)   . Acute peptic ulcer of stomach     As a teenager  . IBS (irritable bowel syndrome)   . Cataract   . High cholesterol    Past Surgical History  Procedure Laterality Date  . Knee arthroscopy Left 1998  . Toe surgery Right 1995  . Nasal septum surgery    . Rhinoplasty    . Appendectomy      age 40  . Colonoscopy  06/03/2010, 08/25/2011    diverticulosis   . Gastrectomy      partial - ulcer as a teen  . Hand surgery Right    Family History  Problem Relation Age of Onset  . Diabetes Mother     type II  . Heart attack Mother 33  . Ulcers      bleeding gastric  . Heart defect      child  . Other      perforated bowel, child  . Colon cancer Sister   . Cancer Sister     colon cancer  .  Asthma Maternal Grandmother   . Mental retardation Son     PTSD  . Cancer Sister     stage 4 bladder cancer   History  Substance Use Topics  . Smoking status: Never Smoker   . Smokeless tobacco: Never Used  . Alcohol Use: No   OB History    Gravida Para Term Preterm AB TAB SAB Ectopic Multiple Living   4 4              Obstetric Comments   1 child died at 45 weeks (heart defects, perforated bowel)     Review of Systems  Musculoskeletal: Positive for arthralgias.       Pain inrighthumb after recent surgery. righ upper extremity and splint with sling  Neurological: Positive for numbness.       Confusion  All other systems reviewed and are negative.     Allergies  Compazine; Prochlorperazine edisylate; and Tetanus toxoid  Home Medications   Prior to Admission medications   Medication Sig Start Date End Date Taking? Authorizing Provider  ACCU-CHEK FASTCLIX LANCETS MISC Check Blood Glucose bid  DX 250.81 02/03/14   [name],  MD  albuterol (PROAIR HFA) 108 (90 BASE) MCG/ACT inhaler Inhale 2 puffs into the lungs every 4 (four) hours as needed. Shortness of breath and wheezing 02/03/14   [name], MD  alosetron (LOTRONEX) 1 MG tablet Take 1 tablet (1 mg total) by mouth 2 (two) times daily. And as directed 04/18/14   Gatha Mayer, MD  aspirin 81 MG tablet Take 81 mg by mouth daily.    Historical Provider, MD  atorvastatin (LIPITOR) 80 MG tablet TAKE 1 TABLET (80 MG) BY MOUTH DAILY. 10/17/14   [name], MD  beclomethasone (QVAR) 40 MCG/ACT inhaler Inhale 2 puffs into the lungs 2 (two) times daily. 08/03/14   Percell Miller Saguier, PA-C  Blood Glucose Monitoring Suppl (ACCU-CHEK AVIVA PLUS) W/DEVICE KIT 1 kit by Does not apply route daily. DX 250.00 08/06/12   Debbrah Alar, NP  clotrimazole-betamethasone (LOTRISONE) cream  09/01/13   Historical Provider, MD  diazepam (VALIUM) 5 MG tablet Take 0.5 to 1 tablet by mouth twice daily as needed for anxiety 11/24/14   [name], MD  dicyclomine (BENTYL) 20 MG tablet Take 1 tablet (20 mg total) by mouth every 6 (six) hours as needed (cramps/diarrhea). 04/25/14   Gatha Mayer, MD  escitalopram (LEXAPRO) 10 MG tablet TAKE 1 TABLET (10 MG TOTAL) BY MOUTH DAILY. 05/08/14   [name], MD  fenofibrate 160 MG tablet TAKE 1 TABLET (160 MG TOTAL) BY MOUTH AT BEDTIME. 11/07/14   [name], MD  fluticasone (FLONASE) 50 MCG/ACT nasal spray Place 1 spray into both nostrils daily. 02/03/14   [name], MD  glimepiride (AMARYL) 2 MG tablet TAKE 1 TABLET BY MOUTH DAILY WITH BREAKFAST 11/13/14   [name], MD  glucose blood (ACCU-CHEK AVIVA PLUS) test strip TEST BLOOD SUGARS 1 TO 2 TIMES A DAY. Dx 250.00 02/03/14   [name], MD  INVOKANA 100 MG TABS TAKE 1 TABLET (100 MG TOTAL) BY MOUTH DAILY. 05/08/14   [name], MD  lisinopril (PRINIVIL,ZESTRIL) 2.5 MG tablet TAKE 1 TABLET (2.5 MG TOTAL) BY MOUTH DAILY. 05/08/14   [name], MD  mometasone (NASONEX) 50 MCG/ACT nasal spray Place 2 sprays into the nose daily as needed. 02/03/14   [name], MD  Multiple Vitamins-Minerals (CENTRUM SILVER PO) Take 1 tablet by mouth daily.      Historical Provider, MD  mupirocin ointment (BACTROBAN) 2 % Place 1 application into the nose at bedtime as needed. 09/18/14   [name], MD  nitroGLYCERIN (NITRODUR - DOSED IN MG/24 HR) 0.2 mg/hr patch  10/31/13   Historical Provider, MD  ondansetron (ZOFRAN) 4 MG tablet Take 1 tablet (4 mg total) by mouth every 8 (eight) hours as needed for nausea. 06/24/12   Gatha Mayer, MD  pantoprazole (PROTONIX) 40 MG tablet TAKE 1 TABLET (40 MG TOTAL) BY MOUTH DAILY. 12/07/14   [name], MD  Probiotic Product (ALIGN) 4 MG CAPS Take 1 capsule by mouth daily.      Historical Provider, MD  sertraline (ZOLOFT) 50 MG tablet Take 1 tablet (50 mg total) by mouth daily. 06/19/14   [name], MD  sitaGLIPtin (JANUVIA) 100 MG tablet Take 1 tablet (100 mg total) by mouth daily.  02/03/14   [name], MD  sulfamethoxazole-trimethoprim (BACTRIM DS,SEPTRA DS) 800-160 MG per tablet Take 1 tablet by mouth 2 (two) times daily. 09/18/14   [name], MD  valACYclovir (VALTREX) 1000 MG  tablet Take 1 tablet (1,000 mg total) by mouth 2 (two) times daily. 12/07/14   [name], MD  valACYclovir (VALTREX) 500 MG tablet TAKE 1 TABLET (500 MG TOTAL) BY MOUTH DAILY. 05/15/14   [name], MD   BP 148/93 mmHg  Pulse 78  Temp(Src) 98 F (36.7 C) (Oral)  Resp 20  Ht '5\' 3"'  (1.6 m)  Wt 154 lb (69.854 kg)  BMI 27.29 kg/m2  SpO2 100% Physical Exam  Constitutional: She is oriented to person, place, and time. She appears well-developed and well-nourished. No distress.  HENT:  Head: Normocephalic and atraumatic.  Mucus membranes dry  Eyes: Conjunctivae are normal. Pupils are equal, round, and reactive to light.  Neck: Neck supple. No tracheal deviation present. No thyromegaly present.  Cardiovascular: Normal rate and regular rhythm.   No murmur heard. Pulmonary/Chest: Effort normal and breath sounds normal.  Abdominal: Soft. Bowel sounds are normal. She exhibits no distension. There is no tenderness.  Musculoskeletal: Normal range of motion. She exhibits no edema or tenderness.  Right upper extremity in thumb spica splint with sling. All digits with good capillary refill. All other extremities without redness swelling or tenderness neurovascularly intact  Neurological: She is alert and oriented to person, place, and time. No cranial nerve deficit. Coordination normal.  Gait normal not lightheaded on standing  Skin: Skin is warm and dry. No rash noted.  Psychiatric: She has a normal mood and affect.  Nursing note and vitals reviewed.  Results for orders placed or performed during the hospital encounter of 49/70/26  Basic metabolic panel  Result Value Ref Range   Sodium 140 135 - 145 mmol/L   Potassium 3.5 3.5 - 5.1 mmol/L   Chloride 108 101 - 111 mmol/L   CO2 25 22  - 32 mmol/L   Glucose, Bld 137 (H) 65 - 99 mg/dL   BUN 16 6 - 20 mg/dL   Creatinine, Ser 0.61 0.44 - 1.00 mg/dL   Calcium 9.7 8.9 - 10.3 mg/dL   GFR calc non Af Amer >60 >60 mL/min   GFR calc Af Amer >60 >60 mL/min   Anion gap 7 5 - 15  CBG monitoring, ED  Result Value Ref Range   Glucose-Capillary 232 (H) 65 - 99 mg/dL   No results found.  ED Course  Procedures (including critical care time) Labs Review Labs Reviewed  CBG MONITORING, ED - Abnormal; Notable for the following:    Glucose-Capillary 232 (*)    All other components within normal limits    Imaging Review No results found.   EKG Interpretation None     12:05 PM patient asymptomatic after drinking water in the emergency department MDM  PLan follow-up with Dr.Blythe.  Blood pressure recheck 3 weeks. She is encouraged to take all of her medications as prescribed. Symptoms felt secondary to hyperglycemia. Diagnoses #1 hyperglycemia #19mdication noncompliance #3elevated blood pressure Final diagnoses:  None        SOrlie Dakin MD 01/08/15 1[place] MD 01/08/15 1209

## 2015-01-08 NOTE — ED Notes (Signed)
MD at bedside. 

## 2015-01-08 NOTE — ED Notes (Addendum)
Pt reports she hasn't taken her meds for a couple days- states she is type 2 diabetic- blood sugar this morning was 191 before eating- reports she has been having generalized facial numbness and "can't keep a straight thought"

## 2015-01-23 ENCOUNTER — Ambulatory Visit (INDEPENDENT_AMBULATORY_CARE_PROVIDER_SITE_OTHER): Payer: PPO | Admitting: Family Medicine

## 2015-01-23 ENCOUNTER — Encounter: Payer: Self-pay | Admitting: Family Medicine

## 2015-01-23 VITALS — BP 120/70 | HR 76 | Temp 98.0°F | Ht 63.0 in | Wt 154.2 lb

## 2015-01-23 DIAGNOSIS — Z800 Family history of malignant neoplasm of digestive organs: Secondary | ICD-10-CM | POA: Diagnosis not present

## 2015-01-23 DIAGNOSIS — E1149 Type 2 diabetes mellitus with other diabetic neurological complication: Secondary | ICD-10-CM

## 2015-01-23 DIAGNOSIS — E1140 Type 2 diabetes mellitus with diabetic neuropathy, unspecified: Secondary | ICD-10-CM

## 2015-01-23 DIAGNOSIS — G4733 Obstructive sleep apnea (adult) (pediatric): Secondary | ICD-10-CM

## 2015-01-23 DIAGNOSIS — I10 Essential (primary) hypertension: Secondary | ICD-10-CM

## 2015-01-23 DIAGNOSIS — E785 Hyperlipidemia, unspecified: Secondary | ICD-10-CM | POA: Diagnosis not present

## 2015-01-23 DIAGNOSIS — E038 Other specified hypothyroidism: Secondary | ICD-10-CM

## 2015-01-23 DIAGNOSIS — E039 Hypothyroidism, unspecified: Secondary | ICD-10-CM

## 2015-01-23 NOTE — Progress Notes (Signed)
Pre visit review using our clinic review tool, if applicable. No additional management support is needed unless otherwise documented below in the visit note. 

## 2015-01-23 NOTE — Patient Instructions (Signed)

## 2015-01-29 ENCOUNTER — Other Ambulatory Visit: Payer: PPO

## 2015-01-30 ENCOUNTER — Other Ambulatory Visit (INDEPENDENT_AMBULATORY_CARE_PROVIDER_SITE_OTHER): Payer: PPO

## 2015-01-30 DIAGNOSIS — I10 Essential (primary) hypertension: Secondary | ICD-10-CM | POA: Diagnosis not present

## 2015-01-30 DIAGNOSIS — E785 Hyperlipidemia, unspecified: Secondary | ICD-10-CM

## 2015-01-30 DIAGNOSIS — E1140 Type 2 diabetes mellitus with diabetic neuropathy, unspecified: Secondary | ICD-10-CM | POA: Diagnosis not present

## 2015-01-30 DIAGNOSIS — E1149 Type 2 diabetes mellitus with other diabetic neurological complication: Secondary | ICD-10-CM

## 2015-01-30 LAB — COMPREHENSIVE METABOLIC PANEL
ALBUMIN: 4.2 g/dL (ref 3.5–5.2)
ALK PHOS: 51 U/L (ref 39–117)
ALT: 24 U/L (ref 0–35)
AST: 19 U/L (ref 0–37)
BILIRUBIN TOTAL: 0.5 mg/dL (ref 0.2–1.2)
BUN: 14 mg/dL (ref 6–23)
CO2: 25 meq/L (ref 19–32)
CREATININE: 0.68 mg/dL (ref 0.40–1.20)
Calcium: 9.6 mg/dL (ref 8.4–10.5)
Chloride: 106 mEq/L (ref 96–112)
GFR: 92.07 mL/min (ref >60.00)
GLUCOSE: 154 mg/dL — AB (ref 70–99)
Potassium: 3.7 mEq/L (ref 3.5–5.1)
SODIUM: 140 meq/L (ref 135–145)
TOTAL PROTEIN: 7 g/dL (ref 6.0–8.3)

## 2015-01-30 LAB — CBC
HCT: 43.2 % (ref 36.0–46.0)
Hemoglobin: 14.4 g/dL (ref 12.0–15.0)
MCHC: 33.5 g/dL (ref 30.0–36.0)
MCV: 92.4 fl (ref 78.0–100.0)
PLATELETS: 235 10*3/uL (ref 150.0–400.0)
RBC: 4.67 Mil/uL (ref 3.87–5.11)
RDW: 14.1 % (ref 11.5–15.5)
WBC: 6.5 10*3/uL (ref 4.0–10.5)

## 2015-01-30 LAB — LIPID PANEL
CHOLESTEROL: 172 mg/dL (ref 0–200)
HDL: 50.7 mg/dL (ref >39.00)
LDL Cholesterol: 90 mg/dL (ref 0–99)
NONHDL: 121.3
Total CHOL/HDL Ratio: 3
Triglycerides: 156 mg/dL — ABNORMAL HIGH (ref 0.0–149.0)
VLDL: 31.2 mg/dL (ref 0.0–40.0)

## 2015-01-30 LAB — HEMOGLOBIN A1C: Hgb A1c MFr Bld: 7.1 % — ABNORMAL HIGH (ref 4.6–6.5)

## 2015-01-30 LAB — TSH: TSH: 4.75 u[IU]/mL — ABNORMAL HIGH (ref 0.35–4.50)

## 2015-02-04 NOTE — Progress Notes (Signed)
Crystal [name]  [id number] 03-04-1951 02/04/2015      Progress Note-Follow Up  Subjective  Chief Complaint  Chief Complaint  Patient presents with  . Follow-up    HPI  Patient is a 64 y.o. female in today for routine medical care. Has been doing well, did cut her foot awhile back but has healed well. No recent illness. Sugars have ranged from hi of 155 to low of 52 which did make her feel shakey. Has been moving bowels daily with Docusate. Uses Robaxin prn qhs for back pain. Well controlled, no changes to meds. Encouraged heart healthy diet such as the DASH diet and exercise as tolerated.   Past Medical History  Diagnosis Date  . Hypertension   . Hyperlipemia   . Dyspepsia   . OSA (obstructive sleep apnea)   . Plantar fasciitis   . Depression   . Asthma     02/08/10 FEV1 1.98 (80%), s/p saba 2.22 l/m (90%).nml  . Anxiety   . 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 Fatty liver 12/19/2010  . Internal hemorrhoids   . Diverticulosis   . Allergy     dust, cock roach,grass,weeds,molds  . GERD (gastroesophageal reflux disease)   . Acute peptic ulcer of stomach     As a teenager  . IBS (irritable bowel syndrome)   . Cataract   . High cholesterol     Past Surgical History  Procedure Laterality Date  . Knee arthroscopy Left 1998  . Toe surgery Right 1995  . Nasal septum surgery    . Rhinoplasty    . Appendectomy      age 56  . Colonoscopy  06/03/2010, 08/25/2011    diverticulosis   . Gastrectomy      partial - ulcer as a teen  . Hand surgery Right     Family History  Problem Relation Age of Onset  . Diabetes Mother     type II  . Heart attack Mother 28  . Ulcers      bleeding gastric  . Heart defect      child  . Other      perforated bowel, child  . Colon cancer Sister   . Cancer Sister     colon cancer  . Asthma Maternal Grandmother   . Mental retardation Son     PTSD  . Cancer Sister     stage 4 bladder cancer    History   Social History  .  Marital Status: Married    Spouse Name: [name]  . Number of Children: 4  . 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 Smoking status: Never Smoker   . Smokeless tobacco: Never Used  . Alcohol Use: No  . Drug Use: No  . Sexual Activity: No     Comment: lives with her mother, no dietary restrictions   Other Topics Concern  . Not on file   Social History Narrative   No caffeine drinks daily     Current Outpatient Prescriptions on File Prior to Visit  Medication Sig Dispense Refill  . ACCU-CHEK FASTCLIX LANCETS MISC Check Blood Glucose bid  DX 250.81 102 each 1  . albuterol (PROAIR HFA) 108 (90 BASE) MCG/ACT inhaler Inhale 2 puffs into the lungs every 4 (four) hours as needed. Shortness of breath and wheezing 18 g 0  . alosetron (LOTRONEX) 1 MG tablet Take 1 tablet (  1 mg total) by mouth 2 (two) times daily. And as directed 180 tablet 3  . aspirin 81 MG tablet Take 81 mg by mouth daily.    Marland Kitchen atorvastatin (LIPITOR) 80 MG tablet TAKE 1 TABLET (80 MG) BY MOUTH DAILY. 30 tablet 6  . beclomethasone (QVAR) 40 MCG/ACT inhaler Inhale 2 puffs into the lungs 2 (two) times daily. 1 Inhaler 12  . Blood Glucose Monitoring Suppl (ACCU-CHEK AVIVA PLUS) W/DEVICE KIT 1 kit by Does not apply route daily. DX 250.00 1 kit 0  . clotrimazole-betamethasone (LOTRISONE) cream     . diazepam (VALIUM) 5 MG tablet Take 0.5 to 1 tablet by mouth twice daily as needed for anxiety 30 tablet 1  . dicyclomine (BENTYL) 20 MG tablet Take 1 tablet (20 mg total) by mouth every 6 (six) hours as needed (cramps/diarrhea). 40 tablet 5  . escitalopram (LEXAPRO) 10 MG tablet TAKE 1 TABLET (10 MG TOTAL) BY MOUTH DAILY. 90 tablet 1  . fenofibrate 160 MG tablet TAKE 1 TABLET (160 MG TOTAL) BY MOUTH AT BEDTIME. 90 tablet 2  . fluticasone (FLONASE) 50 MCG/ACT nasal spray Place 1 spray into both nostrils daily. 16 g 0  . glimepiride (AMARYL) 2 MG tablet TAKE 1 TABLET BY MOUTH  DAILY WITH BREAKFAST 30 tablet 3  . glucose blood (ACCU-CHEK AVIVA PLUS) test strip TEST BLOOD SUGARS 1 TO 2 TIMES A DAY. Dx 250.00 180 each 1  . INVOKANA 100 MG TABS TAKE 1 TABLET (100 MG TOTAL) BY MOUTH DAILY. 90 tablet 1  . lisinopril (PRINIVIL,ZESTRIL) 2.5 MG tablet TAKE 1 TABLET (2.5 MG TOTAL) BY MOUTH DAILY. 90 tablet 1  . mometasone (NASONEX) 50 MCG/ACT nasal spray Place 2 sprays into the nose daily as needed. 17 g 6  . Multiple Vitamins-Minerals (CENTRUM SILVER PO) Take 1 tablet by mouth daily.      . mupirocin ointment (BACTROBAN) 2 % Place 1 application into the nose at bedtime as needed. 22 g 0  . nitroGLYCERIN (NITRODUR - DOSED IN MG/24 HR) 0.2 mg/hr patch     . ondansetron (ZOFRAN) 4 MG tablet Take 1 tablet (4 mg total) by mouth every 8 (eight) hours as needed for nausea. 60 tablet 1  . pantoprazole (PROTONIX) 40 MG tablet TAKE 1 TABLET (40 MG TOTAL) BY MOUTH DAILY. 90 tablet 1  . Probiotic Product (ALIGN) 4 MG CAPS Take 1 capsule by mouth daily.      . sertraline (ZOLOFT) 50 MG tablet Take 1 tablet (50 mg total) by mouth daily. 90 tablet 1  . sitaGLIPtin (JANUVIA) 100 MG tablet Take 1 tablet (100 mg total) by mouth daily. 90 tablet 0  . valACYclovir (VALTREX) 1000 MG tablet Take 1 tablet (1,000 mg total) by mouth 2 (two) times daily. 60 tablet 0  . valACYclovir (VALTREX) 500 MG tablet TAKE 1 TABLET (500 MG TOTAL) BY MOUTH DAILY. 90 tablet 0   No current facility-administered medications on file prior to visit.    Allergies  Allergen Reactions  . Compazine [Prochlorperazine Edisylate]   . Prochlorperazine Edisylate Other (See Comments)    coma for 3 days  . Tetanus Toxoid Other (See Comments)    convulsions    Review of Systems  Review of Systems  Constitutional: Negative for fever, chills and malaise/fatigue.  HENT: Negative for congestion, hearing loss and nosebleeds.   Eyes: Negative for discharge.  Respiratory: Negative for cough, sputum production, shortness of  breath and wheezing.   Cardiovascular: Negative for chest pain, palpitations and  leg swelling.  Gastrointestinal: Negative for heartburn, nausea, vomiting, abdominal pain, diarrhea, constipation and blood in stool.  Genitourinary: Negative for dysuria, urgency, frequency and hematuria.  Musculoskeletal: Positive for back pain. Negative for myalgias and falls.  Skin: Negative for rash.  Neurological: Negative for dizziness, tremors, sensory change, focal weakness, loss of consciousness, weakness and headaches.  Endo/Heme/Allergies: Negative for polydipsia. Does not bruise/bleed easily.  Psychiatric/Behavioral: Negative for depression and suicidal ideas. The patient is not nervous/anxious and does not have insomnia.     Objective  BP 120/70 mmHg  Pulse 76  Temp(Src) 98 F (36.7 C) (Oral)  Ht _0  (1.6 m)  Wt 154 lb 4 oz (69.967 kg)  BMI 27.33 kg/m2  SpO2 93%  Physical Exam  Physical Exam  Constitutional: She is oriented to person, place, and time and well-developed, well-nourished, and in no distress. No distress.  HENT:  Head: Normocephalic and atraumatic.  Eyes: Conjunctivae are normal.  Neck: Neck supple. No thyromegaly present.  Cardiovascular: Normal rate, regular rhythm and normal heart sounds.   No murmur heard. Pulmonary/Chest: Effort normal and breath sounds normal. She has no wheezes.  Abdominal: She exhibits no distension and no mass.  Genitourinary: Cervix normal.  Musculoskeletal: She exhibits no edema.  Lymphadenopathy:    She has no cervical adenopathy.  Neurological: She is alert and oriented to person, place, and time.  Skin: Skin is warm and dry. No rash noted. She is not diaphoretic.  Psychiatric: Memory, affect and judgment normal.    Lab Results  Component Value Date   TSH 4.75* 01/30/2015   Lab Results  Component Value Date   WBC 6.5 01/30/2015   HGB 14.4 01/30/2015   HCT 43.2 01/30/2015   MCV 92.4 01/30/2015   PLT 235.0 01/30/2015   Lab  Results  Component Value Date   CREATININE 0.68 01/30/2015   BUN 14 01/30/2015   NA 140 01/30/2015   K 3.7 01/30/2015   CL 106 01/30/2015   CO2 25 01/30/2015   Lab Results  Component Value Date   ALT 24 01/30/2015   AST 19 01/30/2015   ALKPHOS 51 01/30/2015   BILITOT 0.5 01/30/2015   Lab Results  Component Value Date   CHOL 172 01/30/2015   Lab Results  Component Value Date   HDL 50.70 01/30/2015   Lab Results  Component Value Date   LDLCALC 90 01/30/2015   Lab Results  Component Value Date   TRIG 156.0* 01/30/2015   Lab Results  Component Value Date   CHOLHDL 3 01/30/2015     Assessment & Plan  Obstructive sleep apnea Uses CPAP regularly  Hyperlipidemia Tolerating statin, encouraged heart healthy diet, avoid trans fats, minimize simple carbs and saturated fats. Increase exercise as tolerated  Essential hypertension Well controlled, no changes to meds. Encouraged heart healthy diet such as the DASH diet and exercise as tolerated.   DM (diabetes mellitus), type 2 with neurological complications [id number] acceptable, minimize simple carbs. Increase exercise as tolerated. Continue current meds  Subclinical hypothyroidism TSH up slightly again, will continue to monitor. asymptomatic

## 2015-02-04 NOTE — Assessment & Plan Note (Signed)
Uses CPAP regularly °

## 2015-02-04 NOTE — Assessment & Plan Note (Signed)
Tolerating statin, encouraged heart healthy diet, avoid trans fats, minimize simple carbs and saturated fats. Increase exercise as tolerated 

## 2015-02-04 NOTE — Assessment & Plan Note (Signed)
hgba1c acceptable, minimize simple carbs. Increase exercise as tolerated. Continue current meds 

## 2015-02-04 NOTE — Assessment & Plan Note (Signed)
TSH up slightly again, will continue to monitor. asymptomatic

## 2015-02-04 NOTE — Assessment & Plan Note (Signed)
Well controlled, no changes to meds. Encouraged heart healthy diet such as the DASH diet and exercise as tolerated.  °

## 2015-02-06 ENCOUNTER — Other Ambulatory Visit: Payer: Self-pay | Admitting: Family Medicine

## 2015-02-14 ENCOUNTER — Other Ambulatory Visit: Payer: Self-pay | Admitting: Family Medicine

## 2015-02-14 ENCOUNTER — Telehealth: Payer: Self-pay | Admitting: Family Medicine

## 2015-02-14 DIAGNOSIS — B353 Tinea pedis: Secondary | ICD-10-CM

## 2015-02-14 NOTE — Telephone Encounter (Signed)
Please advise 

## 2015-02-14 NOTE — Telephone Encounter (Signed)
Pt called stating she was here a couple weeks ago and was waiting for appt with foot doctor due to skin cracking on the bottom of her feet. No referral in the system. Pt not sure if she is supposed to find a doctor or if we are referring.

## 2015-03-01 ENCOUNTER — Encounter: Payer: Self-pay | Admitting: Podiatry

## 2015-03-01 ENCOUNTER — Ambulatory Visit (INDEPENDENT_AMBULATORY_CARE_PROVIDER_SITE_OTHER): Payer: PPO | Admitting: Podiatry

## 2015-03-01 VITALS — BP 133/79 | HR 68 | Resp 16

## 2015-03-01 DIAGNOSIS — M722 Plantar fascial fibromatosis: Secondary | ICD-10-CM

## 2015-03-01 DIAGNOSIS — M216X9 Other acquired deformities of unspecified foot: Secondary | ICD-10-CM

## 2015-03-01 DIAGNOSIS — Q667 Congenital pes cavus: Secondary | ICD-10-CM | POA: Diagnosis not present

## 2015-03-01 DIAGNOSIS — L84 Corns and callosities: Secondary | ICD-10-CM

## 2015-03-01 NOTE — Progress Notes (Signed)
Subjective:     Patient ID: Crystal [name], female   DOB: [birth year], 64 y.o.   MRN: [id number]  HPI patient presents with a painful lesion on the plantar aspect of the right foot that she states she's tried to trim and that she also had some bleeding and may have stepped on something with history of diabetes under reasonably good control   Review of Systems     Objective:   Physical Exam Neurovascular status found to be intact with muscle strength adequate and adequate pulses PT DP. Patient's noted to have good digital perfusion and is found to have a prominent second metatarsal right with keratotic lesion plantar that's painful when pressed    Assessment:     Plantarflexed second metatarsal with keratotic lesion formation and pain    Plan:     H&P condition reviewed with patient. I discussed possible osteotomy to elevate the metatarsal if we cannot get symptoms under control and today deep debridement was accomplished. I went ahead today and I also scanned for custom orthotics to reduce all plantar stresses against the metatarsal and I educated her on the procedure

## 2015-03-12 ENCOUNTER — Other Ambulatory Visit: Payer: Self-pay

## 2015-03-12 ENCOUNTER — Ambulatory Visit (INDEPENDENT_AMBULATORY_CARE_PROVIDER_SITE_OTHER): Payer: PPO | Admitting: Family

## 2015-03-12 ENCOUNTER — Encounter: Payer: Self-pay | Admitting: Family

## 2015-03-12 VITALS — BP 134/86 | HR 65 | Temp 97.4°F | Ht 63.0 in | Wt 156.2 lb

## 2015-03-12 DIAGNOSIS — A6000 Herpesviral infection of urogenital system, unspecified: Secondary | ICD-10-CM | POA: Diagnosis not present

## 2015-03-12 NOTE — Patient Instructions (Signed)
Call if symptoms worsen or do not improve in 1 week. Continue valtrex.

## 2015-03-12 NOTE — Assessment & Plan Note (Signed)
She is already on max dose of valtrex. Advised pt to continue valtrex.  OK to do some tub soaks for comfort.  Should resolve in next 7-10 days. Pt advised to let us know if symptoms worsen or if symptoms do not improve.

## 2015-03-12 NOTE — Progress Notes (Signed)
Subjective:    Patient ID: Crystal [name], female    DOB: 06-05-1951, 64 y.o.   MRN: [id number]  HPI  Crystal [name] is a 64 yr old female who presents today with a several day history of pain at the base of her right sacrum. Has hx of genital herpes which has presented in this area in the past, however she reports pain is worse than it has been previously. She is maintained on valtrex 1076m twice daily.   Review of Systems See HPI  Past Medical History  Diagnosis Date  . Hypertension   . Hyperlipemia   . Dyspepsia   . OSA (obstructive sleep apnea)   . Plantar fasciitis   . Depression   . Asthma     02/08/10 FEV1 1.98 (80%), s/p saba 2.22 l/m (90%).nml  . Anxiety   . 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 Fatty liver 12/19/2010  . Internal hemorrhoids   . Diverticulosis   . Allergy     dust, cock roach,grass,weeds,molds  . GERD (gastroesophageal reflux disease)   . Acute peptic ulcer of stomach     As a teenager  . IBS (irritable bowel syndrome)   . Cataract   . High cholesterol     Social History   Social History  . Marital Status: Married    Spouse Name: LThereasa Solo . Number of Children: 4  . Years of Education: N/A   Occupational History  . DATA ENTRY [employer]  Social History Main Topics  . Smoking status: Never Smoker   . Smokeless tobacco: Never Used  . Alcohol Use: No  . Drug Use: No  . Sexual Activity: No     Comment: lives with her mother, no dietary restrictions   Other Topics Concern  . Not on file   Social History Narrative   No caffeine drinks daily     Past Surgical History  Procedure Laterality Date  . Knee arthroscopy Left 1998  . Toe surgery Right 1995  . Nasal septum surgery    . Rhinoplasty    . Appendectomy      age 64 . Colonoscopy  06/03/2010, 08/25/2011    diverticulosis   . Gastrectomy      partial - ulcer as a teen  . Hand surgery Right     Family History  Problem Relation Age of Onset  . Diabetes Mother     type II    . Heart attack Mother 528 . Ulcers      bleeding gastric  . Heart defect      child  . Other      perforated bowel, child  . Colon cancer Sister   . Cancer Sister     colon cancer  . Asthma Maternal Grandmother   . Mental retardation Son     PTSD  . Cancer Sister     stage 4 bladder cancer    Allergies  Allergen Reactions  . Compazine [Prochlorperazine Edisylate]   . Prochlorperazine Edisylate Other (See Comments)    coma for 3 days  . Tetanus Toxoid Other (See Comments)    convulsions    Current Outpatient Prescriptions on File Prior to Visit  Medication Sig Dispense Refill  . albuterol (PROAIR HFA) 108 (90 BASE) MCG/ACT inhaler Inhale 2 puffs into the lungs every 4 (four) hours as needed. Shortness of breath and wheezing 18 g 0  . aspirin 81 MG tablet Take 81 mg by mouth daily.    .Marland Kitchen  atorvastatin (LIPITOR) 80 MG tablet TAKE 1 TABLET (80 MG) BY MOUTH DAILY. 30 tablet 6  . beclomethasone (QVAR) 40 MCG/ACT inhaler Inhale 2 puffs into the lungs 2 (two) times daily. 1 Inhaler 12  . clotrimazole-betamethasone (LOTRISONE) cream     . diazepam (VALIUM) 5 MG tablet Take 0.5 to 1 tablet by mouth twice daily as needed for anxiety 30 tablet 1  . dicyclomine (BENTYL) 20 MG tablet Take 1 tablet (20 mg total) by mouth every 6 (six) hours as needed (cramps/diarrhea). 40 tablet 5  . docusate sodium (COLACE) 100 MG capsule Take 100 mg by mouth daily.    Marland Kitchen escitalopram (LEXAPRO) 10 MG tablet TAKE 1 TABLET (10 MG TOTAL) BY MOUTH DAILY. 90 tablet 1  . fenofibrate 160 MG tablet TAKE 1 TABLET (160 MG TOTAL) BY MOUTH AT BEDTIME. 90 tablet 2  . fluticasone (FLONASE) 50 MCG/ACT nasal spray Place 1 spray into both nostrils daily. 16 g 0  . glimepiride (AMARYL) 2 MG tablet TAKE 1 TABLET BY MOUTH DAILY WITH BREAKFAST 30 tablet 3  . INVOKANA 100 MG TABS tablet TAKE 1 TABLET (100 MG TOTAL) BY MOUTH DAILY. 30 tablet 6  . lisinopril (PRINIVIL,ZESTRIL) 2.5 MG tablet TAKE 1 TABLET (2.5 MG TOTAL) BY MOUTH  DAILY. 90 tablet 1  . methocarbamol (ROBAXIN) 500 MG tablet Take 500 mg by mouth every 6 (six) hours as needed for muscle spasms.    . mometasone (NASONEX) 50 MCG/ACT nasal spray Place 2 sprays into the nose daily as needed. 17 g 6  . Multiple Vitamins-Minerals (CENTRUM SILVER PO) Take 1 tablet by mouth daily.      . mupirocin ointment (BACTROBAN) 2 % Place 1 application into the nose at bedtime as needed. 22 g 0  . ondansetron (ZOFRAN) 4 MG tablet Take 1 tablet (4 mg total) by mouth every 8 (eight) hours as needed for nausea. 60 tablet 1  . oxyCODONE-acetaminophen (PERCOCET/ROXICET) 5-325 MG per tablet Take 1-2 tablets by mouth every 4 (four) hours as needed for severe pain.    . pantoprazole (PROTONIX) 40 MG tablet TAKE 1 TABLET (40 MG TOTAL) BY MOUTH DAILY. 90 tablet 1  . Probiotic Product (ALIGN) 4 MG CAPS Take 1 capsule by mouth daily.      . sertraline (ZOLOFT) 50 MG tablet TAKE 1 TABLET (50 MG TOTAL) BY MOUTH DAILY. 90 tablet 1  . sitaGLIPtin (JANUVIA) 100 MG tablet Take 1 tablet (100 mg total) by mouth daily. 90 tablet 0  . valACYclovir (VALTREX) 1000 MG tablet Take 1 tablet (1,000 mg total) by mouth 2 (two) times daily. 60 tablet 0  . valACYclovir (VALTREX) 500 MG tablet TAKE 1 TABLET (500 MG TOTAL) BY MOUTH DAILY. 90 tablet 0  . vitamin C (ASCORBIC ACID) 500 MG tablet Take 500 mg by mouth daily.    Marland Kitchen ACCU-CHEK FASTCLIX LANCETS MISC Check Blood Glucose bid  DX 250.81 (Patient not taking: Reported on 03/12/2015) 102 each 1  . alosetron (LOTRONEX) 1 MG tablet Take 1 tablet (1 mg total) by mouth 2 (two) times daily. And as directed (Patient not taking: Reported on 03/12/2015) 180 tablet 3  . Blood Glucose Monitoring Suppl (ACCU-CHEK AVIVA PLUS) W/DEVICE KIT 1 kit by Does not apply route daily. DX 250.00 (Patient not taking: Reported on 03/12/2015) 1 kit 0  . glucose blood (ACCU-CHEK AVIVA PLUS) test strip TEST BLOOD SUGARS 1 TO 2 TIMES A DAY. Dx 250.00 (Patient not taking: Reported on  03/12/2015) 180 each 1  . nitroGLYCERIN (  NITRODUR - DOSED IN MG/24 HR) 0.2 mg/hr patch      No current facility-administered medications on file prior to visit.    BP 134/86 mmHg  Pulse 65  Temp(Src) 97.4 F (36.3 C) (Oral)  Ht _0  (1.6 m)  Wt 156 lb 4 oz (70.875 kg)  BMI 27.69 kg/m2  SpO2 95%       Objective:   Physical Exam  Constitutional: She is oriented to person, place, and time. She appears well-developed and well-nourished. No distress.  Neurological: She is alert and oriented to person, place, and time.  Skin: Skin is warm and dry.  Quarter sized cluster of vesicles noted at base of right buttock          Assessment & Plan:

## 2015-03-12 NOTE — Progress Notes (Signed)
Pre visit review using our clinic review tool, if applicable. No additional management support is needed unless otherwise documented below in the visit note. 

## 2015-03-22 ENCOUNTER — Ambulatory Visit: Payer: PPO | Admitting: Family Medicine

## 2015-03-27 ENCOUNTER — Other Ambulatory Visit: Payer: PPO

## 2015-03-30 ENCOUNTER — Ambulatory Visit (INDEPENDENT_AMBULATORY_CARE_PROVIDER_SITE_OTHER): Payer: PPO | Admitting: Podiatry

## 2015-03-30 DIAGNOSIS — L84 Corns and callosities: Secondary | ICD-10-CM

## 2015-03-30 NOTE — Progress Notes (Signed)
Patient ID: Crystal [name], female   DOB: [birth year], 64 y.o.   MRN: [id number] My callus is painful I need it scraped.  Patient presents for orthotic pick up.  Verbal and written break in and wear instructions given.  Patient will follow up in 4 weeks if symptoms worsen or fail to improve.

## 2015-03-30 NOTE — Patient Instructions (Signed)

## 2015-04-01 NOTE — Progress Notes (Signed)
Subjective:     Patient ID: Crystal [name], female   DOB: Sep 28, 1950, 64 y.o.   MRN: [id number]  HPI patient states that the lesion is still quite sore when pressed and that she is still having difficulty at times with walking   Review of Systems     Objective:   Physical Exam Neurovascular status intact with plantar keratotic lesion subsecond metatarsal right still present with pain    Assessment:     Plantarflexed metatarsal with pain in the second metatarsal    Plan:     Debride lesion and applied chemical and sterile dressing and begin orthotics and reappoint as needed

## 2015-04-02 ENCOUNTER — Encounter: Payer: Self-pay | Admitting: Family

## 2015-04-02 ENCOUNTER — Ambulatory Visit (INDEPENDENT_AMBULATORY_CARE_PROVIDER_SITE_OTHER): Payer: PPO | Admitting: Family

## 2015-04-02 VITALS — BP 140/84 | HR 82 | Temp 97.5°F | Resp 16 | Ht 63.0 in | Wt 154.0 lb

## 2015-04-02 DIAGNOSIS — Z23 Encounter for immunization: Secondary | ICD-10-CM

## 2015-04-02 DIAGNOSIS — H9202 Otalgia, left ear: Secondary | ICD-10-CM | POA: Diagnosis not present

## 2015-04-02 DIAGNOSIS — H9209 Otalgia, unspecified ear: Secondary | ICD-10-CM | POA: Insufficient documentation

## 2015-04-02 NOTE — Patient Instructions (Signed)
Start claritin 10mg  once daily. Restart nasonex. You may use tylenol as needed for ear pain. Call if symptoms worsen or if not improved in 1-2 weeks.

## 2015-04-02 NOTE — Assessment & Plan Note (Signed)
Otalgia- ? Eustachian tube dysfunction. Advised pt:  Start claritin 10mg  once daily. Restart nasonex. You may use tylenol as needed for ear pain. Call if symptoms worsen or if not improved in 1-2 weeks.   Flu shot today

## 2015-04-02 NOTE — Progress Notes (Signed)
Pre visit review using our clinic review tool, if applicable. No additional management support is needed unless otherwise documented below in the visit note. 

## 2015-04-02 NOTE — Progress Notes (Signed)
Subjective:    Patient ID: Crystal [name], female    DOB: 14-Feb-1951, 64 y.o.   MRN: [id number]  HPI  Crystal [name] is a 64 yr old female who presents today to discuss left ear pain. Pain began 2 weeks ago. She denies fever. Has some chronic nasal congestion. She uses nasonex prn.     Review of Systems    see HPI  Past Medical History  Diagnosis Date  . Hypertension   . Hyperlipemia   . Dyspepsia   . OSA (obstructive sleep apnea)   . Plantar fasciitis   . Depression   . Asthma     02/08/10 FEV1 1.98 (80%), s/p saba 2.22 l/m (90%).nml  . Anxiety   . 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 Fatty liver 12/19/2010  . Internal hemorrhoids   . Diverticulosis   . Allergy     dust, cock roach,grass,weeds,molds  . GERD (gastroesophageal reflux disease)   . Acute peptic ulcer of stomach     As a teenager  . IBS (irritable bowel syndrome)   . Cataract   . High cholesterol     Social History   Social History  . Marital Status: Married    Spouse Name: [name]  . Number of Children: 4  . 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 Smoking status: Never Smoker   . Smokeless tobacco: Never Used  . Alcohol Use: No  . Drug Use: No  . Sexual Activity: No     Comment: lives with her mother, no dietary restrictions   Other Topics Concern  . Not on file   Social History Narrative   No caffeine drinks daily     Past Surgical History  Procedure Laterality Date  . Knee arthroscopy Left 1998  . Toe surgery Right 1995  . Nasal septum surgery    . Rhinoplasty    . Appendectomy      age 36  . Colonoscopy  06/03/2010, 08/25/2011    diverticulosis   . Gastrectomy      partial - ulcer as a teen  . Hand surgery Right     Family History  Problem Relation Age of Onset  . Diabetes Mother     type II  . Heart attack Mother 75  . Ulcers      bleeding gastric  . Heart defect      child  . Other      perforated  bowel, child  . Colon cancer Sister   . Cancer Sister     colon cancer  . Asthma Maternal Grandmother   . Mental retardation Son     PTSD  . Cancer Sister     stage 4 bladder cancer    Allergies  Allergen Reactions  . Compazine [Prochlorperazine Edisylate]   . Prochlorperazine Edisylate Other (See Comments)    coma for 3 days  . Tetanus Toxoid Other (See Comments)    convulsions    Current Outpatient Prescriptions on File Prior to Visit  Medication Sig Dispense Refill  . ACCU-CHEK FASTCLIX LANCETS MISC Check Blood Glucose bid  DX 250.81 102 each 1  . albuterol (PROAIR HFA) 108 (90 BASE) MCG/ACT inhaler Inhale 2 puffs into the lungs every 4 (four) hours as needed. Shortness of breath and wheezing 18 g 0  . alosetron (LOTRONEX) 1 MG tablet Take 1 tablet (1 mg total) by mouth 2 (two)  times daily. And as directed 180 tablet 3  . aspirin 81 MG tablet Take 81 mg by mouth daily.    Marland Kitchen atorvastatin (LIPITOR) 80 MG tablet TAKE 1 TABLET (80 MG) BY MOUTH DAILY. 30 tablet 6  . beclomethasone (QVAR) 40 MCG/ACT inhaler Inhale 2 puffs into the lungs 2 (two) times daily. 1 Inhaler 12  . Blood Glucose Monitoring Suppl (ACCU-CHEK AVIVA PLUS) W/DEVICE KIT 1 kit by Does not apply route daily. DX 250.00 1 kit 0  . clotrimazole-betamethasone (LOTRISONE) cream     . diazepam (VALIUM) 5 MG tablet Take 0.5 to 1 tablet by mouth twice daily as needed for anxiety 30 tablet 1  . dicyclomine (BENTYL) 20 MG tablet Take 1 tablet (20 mg total) by mouth every 6 (six) hours as needed (cramps/diarrhea). 40 tablet 5  . docusate sodium (COLACE) 100 MG capsule Take 100 mg by mouth daily.    Marland Kitchen escitalopram (LEXAPRO) 10 MG tablet TAKE 1 TABLET (10 MG TOTAL) BY MOUTH DAILY. 90 tablet 1  . fenofibrate 160 MG tablet TAKE 1 TABLET (160 MG TOTAL) BY MOUTH AT BEDTIME. 90 tablet 2  . fluticasone (FLONASE) 50 MCG/ACT nasal spray Place 1 spray into both nostrils daily. 16 g 0  . glimepiride (AMARYL) 2 MG tablet TAKE 1 TABLET BY  MOUTH DAILY WITH BREAKFAST 30 tablet 3  . glucose blood (ACCU-CHEK AVIVA PLUS) test strip TEST BLOOD SUGARS 1 TO 2 TIMES A DAY. Dx 250.00 180 each 1  . INVOKANA 100 MG TABS tablet TAKE 1 TABLET (100 MG TOTAL) BY MOUTH DAILY. 30 tablet 6  . lisinopril (PRINIVIL,ZESTRIL) 2.5 MG tablet TAKE 1 TABLET (2.5 MG TOTAL) BY MOUTH DAILY. 90 tablet 1  . methocarbamol (ROBAXIN) 500 MG tablet Take 500 mg by mouth every 6 (six) hours as needed for muscle spasms.    . mometasone (NASONEX) 50 MCG/ACT nasal spray Place 2 sprays into the nose daily as needed. 17 g 6  . Multiple Vitamins-Minerals (CENTRUM SILVER PO) Take 1 tablet by mouth daily.      . mupirocin ointment (BACTROBAN) 2 % Place 1 application into the nose at bedtime as needed. 22 g 0  . nitroGLYCERIN (NITRODUR - DOSED IN MG/24 HR) 0.2 mg/hr patch     . ondansetron (ZOFRAN) 4 MG tablet Take 1 tablet (4 mg total) by mouth every 8 (eight) hours as needed for nausea. 60 tablet 1  . oxyCODONE-acetaminophen (PERCOCET/ROXICET) 5-325 MG per tablet Take 1-2 tablets by mouth every 4 (four) hours as needed for severe pain.    . pantoprazole (PROTONIX) 40 MG tablet TAKE 1 TABLET (40 MG TOTAL) BY MOUTH DAILY. 90 tablet 1  . Probiotic Product (ALIGN) 4 MG CAPS Take 1 capsule by mouth daily.      . sertraline (ZOLOFT) 50 MG tablet TAKE 1 TABLET (50 MG TOTAL) BY MOUTH DAILY. 90 tablet 1  . sitaGLIPtin (JANUVIA) 100 MG tablet Take 1 tablet (100 mg total) by mouth daily. 90 tablet 0  . valACYclovir (VALTREX) 1000 MG tablet Take 1 tablet (1,000 mg total) by mouth 2 (two) times daily. 60 tablet 0  . vitamin C (ASCORBIC ACID) 500 MG tablet Take 500 mg by mouth daily.     No current facility-administered medications on file prior to visit.    BP 140/84 mmHg  Pulse 82  Temp(Src) 97.5 F (36.4 C) (Oral)  Resp 16  Ht '5\' 3"'  (1.6 m)  Wt 154 lb (69.854 kg)  BMI 27.29 kg/m2  SpO2 99%  Objective:   Physical Exam  Constitutional: She appears well-developed and  well-nourished.  HENT:  Right Ear: Tympanic membrane and ear canal normal.  Left Ear: Tympanic membrane and ear canal normal. Tympanic membrane is not injected, not perforated, not erythematous, not retracted and not bulging.  Mouth/Throat: Oropharynx is clear and moist. No oropharyngeal exudate.  R TM is occluded by cerumen  Cardiovascular: Normal rate, regular rhythm and normal heart sounds.   No murmur heard. Pulmonary/Chest: Effort normal and breath sounds normal. No respiratory distress. She has no wheezes.  Lymphadenopathy:    She has no cervical adenopathy.  Psychiatric: She has a normal mood and affect. Her behavior is normal. Judgment and thought content normal.          Assessment & Plan:

## 2015-04-11 ENCOUNTER — Other Ambulatory Visit: Payer: Self-pay | Admitting: Family Medicine

## 2015-04-12 ENCOUNTER — Other Ambulatory Visit: Payer: Self-pay | Admitting: Family Medicine

## 2015-04-12 MED ORDER — DIAZEPAM 5 MG PO TABS: ORAL_TABLET | ORAL | Status: DC

## 2015-04-12 NOTE — Telephone Encounter (Signed)
Faxed hardcopy for Valium to [facility].

## 2015-04-12 NOTE — Telephone Encounter (Signed)
Requesting: Valium Contract  None UDS None Last OV   01/23/15 Last Refill #30 with 1 refill on 11/24/14  Please Advise

## 2015-04-20 LAB — HM MAMMOGRAPHY

## 2015-04-23 ENCOUNTER — Telehealth: Payer: Self-pay | Admitting: Family Medicine

## 2015-04-23 ENCOUNTER — Other Ambulatory Visit (INDEPENDENT_AMBULATORY_CARE_PROVIDER_SITE_OTHER): Payer: PPO

## 2015-04-23 DIAGNOSIS — E785 Hyperlipidemia, unspecified: Secondary | ICD-10-CM

## 2015-04-23 DIAGNOSIS — I10 Essential (primary) hypertension: Secondary | ICD-10-CM | POA: Diagnosis not present

## 2015-04-23 DIAGNOSIS — E1149 Type 2 diabetes mellitus with other diabetic neurological complication: Secondary | ICD-10-CM

## 2015-04-23 DIAGNOSIS — E1140 Type 2 diabetes mellitus with diabetic neuropathy, unspecified: Secondary | ICD-10-CM

## 2015-04-23 LAB — COMPREHENSIVE METABOLIC PANEL
ALT: 21 U/L (ref 0–35)
AST: 16 U/L (ref 0–37)
Albumin: 4.1 g/dL (ref 3.5–5.2)
Alkaline Phosphatase: 70 U/L (ref 39–117)
BUN: 18 mg/dL (ref 6–23)
CALCIUM: 9.2 mg/dL (ref 8.4–10.5)
CHLORIDE: 107 meq/L (ref 96–112)
CO2: 27 meq/L (ref 19–32)
CREATININE: 0.67 mg/dL (ref 0.40–1.20)
GFR: 93.6 mL/min (ref >60.00)
Glucose, Bld: 144 mg/dL — ABNORMAL HIGH (ref 70–99)
Potassium: 4.2 mEq/L (ref 3.5–5.1)
Sodium: 141 mEq/L (ref 135–145)
Total Bilirubin: 0.5 mg/dL (ref 0.2–1.2)
Total Protein: 7.2 g/dL (ref 6.0–8.3)

## 2015-04-23 LAB — LIPID PANEL
CHOLESTEROL: 145 mg/dL (ref 0–200)
HDL: 45.3 mg/dL (ref >39.00)
LDL CALC: 70 mg/dL (ref 0–99)
NonHDL: 99.36
TRIGLYCERIDES: 147 mg/dL (ref 0.0–149.0)
Total CHOL/HDL Ratio: 3
VLDL: 29.4 mg/dL (ref 0.0–40.0)

## 2015-04-23 LAB — CBC WITH DIFFERENTIAL/PLATELET
BASOS ABS: 0 10*3/uL (ref 0.0–0.1)
Basophils Relative: 0.3 % (ref 0.0–3.0)
Eosinophils Absolute: 0.1 10*3/uL (ref 0.0–0.7)
Eosinophils Relative: 1.7 % (ref 0.0–5.0)
HCT: 42.7 % (ref 36.0–46.0)
HEMOGLOBIN: 14.4 g/dL (ref 12.0–15.0)
LYMPHS PCT: 31.6 % (ref 12.0–46.0)
Lymphs Abs: 2.2 10*3/uL (ref 0.7–4.0)
MCHC: 33.7 g/dL (ref 30.0–36.0)
MCV: 92 fl (ref 78.0–100.0)
MONOS PCT: 7.8 % (ref 3.0–12.0)
Monocytes Absolute: 0.5 10*3/uL (ref 0.1–1.0)
Neutro Abs: 4 10*3/uL (ref 1.4–7.7)
Neutrophils Relative %: 58.6 % (ref 43.0–77.0)
PLATELETS: 237 10*3/uL (ref 150.0–400.0)
RBC: 4.64 Mil/uL (ref 3.87–5.11)
RDW: 13.3 % (ref 11.5–15.5)
WBC: 6.9 10*3/uL (ref 4.0–10.5)

## 2015-04-23 LAB — TSH: TSH: 3.75 u[IU]/mL (ref 0.35–4.50)

## 2015-04-23 LAB — HEMOGLOBIN A1C: Hgb A1c MFr Bld: 7.1 % — ABNORMAL HIGH (ref 4.6–6.5)

## 2015-04-23 NOTE — Telephone Encounter (Signed)
Labs entered.

## 2015-04-25 ENCOUNTER — Encounter: Payer: Self-pay | Admitting: Family Medicine

## 2015-04-30 ENCOUNTER — Ambulatory Visit (INDEPENDENT_AMBULATORY_CARE_PROVIDER_SITE_OTHER): Payer: PPO | Admitting: Family Medicine

## 2015-04-30 ENCOUNTER — Encounter: Payer: Self-pay | Admitting: Family Medicine

## 2015-04-30 VITALS — BP 112/72 | HR 85 | Temp 97.9°F | Ht 63.0 in | Wt 151.1 lb

## 2015-04-30 DIAGNOSIS — I10 Essential (primary) hypertension: Secondary | ICD-10-CM

## 2015-04-30 DIAGNOSIS — K760 Fatty (change of) liver, not elsewhere classified: Secondary | ICD-10-CM | POA: Diagnosis not present

## 2015-04-30 DIAGNOSIS — H547 Unspecified visual loss: Secondary | ICD-10-CM

## 2015-04-30 DIAGNOSIS — E1149 Type 2 diabetes mellitus with other diabetic neurological complication: Secondary | ICD-10-CM

## 2015-04-30 DIAGNOSIS — E785 Hyperlipidemia, unspecified: Secondary | ICD-10-CM

## 2015-04-30 DIAGNOSIS — E669 Obesity, unspecified: Secondary | ICD-10-CM | POA: Diagnosis not present

## 2015-04-30 DIAGNOSIS — K219 Gastro-esophageal reflux disease without esophagitis: Secondary | ICD-10-CM

## 2015-04-30 DIAGNOSIS — M8580 Other specified disorders of bone density and structure, unspecified site: Secondary | ICD-10-CM

## 2015-04-30 DIAGNOSIS — E119 Type 2 diabetes mellitus without complications: Secondary | ICD-10-CM

## 2015-04-30 DIAGNOSIS — Z0000 Encounter for general adult medical examination without abnormal findings: Secondary | ICD-10-CM

## 2015-04-30 DIAGNOSIS — E1169 Type 2 diabetes mellitus with other specified complication: Secondary | ICD-10-CM

## 2015-04-30 LAB — MICROALBUMIN / CREATININE URINE RATIO
Creatinine,U: 139.2 mg/dL
Microalb Creat Ratio: 0.9 mg/g (ref 0.0–30.0)
Microalb, Ur: 1.3 mg/dL (ref 0.0–1.9)

## 2015-04-30 NOTE — Assessment & Plan Note (Signed)
Tolerating statin, encouraged heart healthy diet, avoid trans fats, minimize simple carbs and saturated fats. Increase exercise as tolerated 

## 2015-04-30 NOTE — Progress Notes (Signed)
Pre visit review using our clinic review tool, if applicable. No additional management support is needed unless otherwise documented below in the visit note. 

## 2015-04-30 NOTE — Assessment & Plan Note (Signed)
Well controlled, no changes to meds. Encouraged heart healthy diet such as the DASH diet and exercise as tolerated.  °

## 2015-04-30 NOTE — Assessment & Plan Note (Signed)
Avoid offending foods, take probiotics. Do not eat large meals in late evening and consider raising head of bed.  

## 2015-04-30 NOTE — Patient Instructions (Signed)
Fatty Liver  Fatty liver, also called hepatic steatosis or steatohepatitis, is a condition in which too much fat has built up in your liver cells. The liver removes harmful substances from your bloodstream. It produces fluids your body needs. It also helps your body use and store energy from the food you eat.  In many cases, fatty liver does not cause symptoms or problems. It is often diagnosed when tests are being done for other reasons. However, over time, fatty liver can cause inflammation that may lead to more serious liver problems, such as scarring of the liver (cirrhosis).  CAUSES   Causes of fatty liver may include:    Drinking too much alcohol.   Poor nutrition.   Obesity.   Cushing syndrome.   Diabetes.   Hyperlipidemia.   Pregnancy.   Certain drugs.   Poisons.   Some viral infections.  RISK FACTORS  You may be more likely to develop fatty liver if you:   Abuse alcohol.   Are pregnant.   Are overweight.   Have diabetes.   Have hepatitis.   Have a high triglyceride level.   SIGNS AND SYMPTOMS   Fatty liver often does not cause any symptoms. In cases where symptoms develop, they can include:   Fatigue.   Weakness.   Weight loss.   Confusion.    Abdominal pain.   Yellowing of your skin and the white parts of your eyes (jaundice).   Nausea and vomiting.  DIAGNOSIS   Fatty liver may be diagnosed by:    Physical exam and medical history.   Blood tests.   Imaging tests, such as an ultrasound, CT scan, or MRI.   Liver biopsy. A small sample of liver tissue is removed using a needle. The sample is then looked at under a microscope.  TREATMENT   Fatty liver is often caused by other health conditions. Treatment for fatty liver may involve medicines and lifestyle changes to manage conditions such as:    Alcoholism.   High cholesterol.   Diabetes.   Being overweight or obese.   HOME CARE INSTRUCTIONS   Eat a healthy diet as directed by your health care provider.   Exercise regularly.  This can help you lose weight and control your cholesterol and diabetes. Talk to your health care provider about an exercise plan and which activities are best for you.   Do not drink alcohol.    Take medicines only as directed by your health care provider.  SEEK MEDICAL CARE IF:  You have difficulty controlling your:   Blood sugar.   Cholesterol.   Alcohol consumption.  SEEK IMMEDIATE MEDICAL CARE IF:   You have abdominal pain.   You have jaundice.   You have nausea and vomiting.     This information is not intended to replace advice given to you by your health care provider. Make sure you discuss any questions you have with your health care provider.     Document Released: 08/15/2005 Document Revised: 07/21/2014 Document Reviewed: 11/09/2013  [employer]vier Interactive Patient Education 2016 [employer]

## 2015-04-30 NOTE — Progress Notes (Signed)
Subjective:    Patient ID: Crystal [name], female    DOB: 28-Aug-1950, 64 y.o.   MRN: [id number]  Chief Complaint  Patient presents with  . Follow-up    HPI Patient is in today for follow-up. Feeling well. Is having some intermittent trouble with mild abdominal cramping no recent illness or fevers. No nausea vomiting. No bloody or tarry stool. Has some intermittent foot pain. No polyuria or polydipsia. No dysuria. Denies CP/palp/SOB/HA/congestion/fevers/GI or GU c/o. Taking meds as prescribed  Past Medical History  Diagnosis Date  . Hypertension   . Hyperlipemia   . Dyspepsia   . OSA (obstructive sleep apnea)   . Plantar fasciitis   . Depression   . Asthma     02/08/10 FEV1 1.98 (80%), s/p saba 2.22 l/m (90%).nml  . Anxiety   . 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 Fatty liver 12/19/2010  . Internal hemorrhoids   . Diverticulosis   . Allergy     dust, cock roach,grass,weeds,molds  . GERD (gastroesophageal reflux disease)   . Acute peptic ulcer of stomach     As a teenager  . IBS (irritable bowel syndrome)   . Cataract   . High cholesterol     Past Surgical History  Procedure Laterality Date  . Knee arthroscopy Left 1998  . Toe surgery Right 1995  . Nasal septum surgery    . Rhinoplasty    . Appendectomy      age 34  . Colonoscopy  06/03/2010, 08/25/2011    diverticulosis   . Gastrectomy      partial - ulcer as a teen  . Hand surgery Right     Family History  Problem Relation Age of Onset  . Diabetes Mother     type II  . Heart attack Mother 59  . Ulcers      bleeding gastric  . Heart defect      child  . Other      perforated bowel, child  . Colon cancer Sister   . Cancer Sister     colon cancer  . Asthma Maternal Grandmother   . Mental retardation Son     PTSD  . Cancer Sister     stage 4 bladder cancer    Social History   Social History  . Marital Status: Married    Spouse Name: [name]  . Number of Children: 4  . 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 Smoking status: Never Smoker   . Smokeless tobacco: Never Used  . Alcohol Use: No  . Drug Use: No  . Sexual Activity: No     Comment: lives with her mother, no dietary restrictions   Other Topics Concern  . Not on file   Social History Narrative   No caffeine drinks daily     Outpatient Prescriptions Prior to Visit  Medication Sig Dispense Refill  . ACCU-CHEK FASTCLIX LANCETS MISC Check Blood Glucose bid  DX 250.81 102 each 1  . albuterol (PROAIR HFA) 108 (90 BASE) MCG/ACT inhaler Inhale 2 puffs into the lungs every 4 (four) hours as needed. Shortness of breath and wheezing 18 g 0  . alosetron (LOTRONEX) 1 MG tablet Take 1 tablet (1 mg total) by mouth 2 (two) times daily. And as directed 180 tablet 3  . aspirin 81 MG tablet Take 81 mg by mouth daily.    Marland Kitchen atorvastatin (  LIPITOR) 80 MG tablet TAKE 1 TABLET (80 MG) BY MOUTH DAILY. 30 tablet 6  . beclomethasone (QVAR) 40 MCG/ACT inhaler Inhale 2 puffs into the lungs 2 (two) times daily. 1 Inhaler 12  . Blood Glucose Monitoring Suppl (ACCU-CHEK AVIVA PLUS) W/DEVICE KIT 1 kit by Does not apply route daily. DX 250.00 1 kit 0  . clotrimazole-betamethasone (LOTRISONE) cream     . diazepam (VALIUM) 5 MG tablet Take 0.5 to 1 tablet by mouth twice daily as needed for anxiety 30 tablet 1  . dicyclomine (BENTYL) 20 MG tablet Take 1 tablet (20 mg total) by mouth every 6 (six) hours as needed (cramps/diarrhea). 40 tablet 5  . escitalopram (LEXAPRO) 10 MG tablet TAKE 1 TABLET (10 MG TOTAL) BY MOUTH DAILY. 90 tablet 1  . fenofibrate 160 MG tablet TAKE 1 TABLET (160 MG TOTAL) BY MOUTH AT BEDTIME. 90 tablet 2  . fluticasone (FLONASE) 50 MCG/ACT nasal spray Place 1 spray into both nostrils daily. 16 g 0  . glimepiride (AMARYL) 2 MG tablet TAKE 1 TABLET BY MOUTH DAILY WITH BREAKFAST 30 tablet 3  . glucose blood (ACCU-CHEK AVIVA PLUS) test strip TEST BLOOD SUGARS 1 TO 2  TIMES A DAY. Dx 250.00 180 each 1  . INVOKANA 100 MG TABS tablet TAKE 1 TABLET (100 MG TOTAL) BY MOUTH DAILY. 30 tablet 6  . lisinopril (PRINIVIL,ZESTRIL) 2.5 MG tablet TAKE 1 TABLET (2.5 MG TOTAL) BY MOUTH DAILY. 90 tablet 1  . methocarbamol (ROBAXIN) 500 MG tablet Take 500 mg by mouth every 6 (six) hours as needed for muscle spasms.    . mometasone (NASONEX) 50 MCG/ACT nasal spray Place 2 sprays into the nose daily as needed. 17 g 6  . Multiple Vitamins-Minerals (CENTRUM SILVER PO) Take 1 tablet by mouth daily.      . mupirocin ointment (BACTROBAN) 2 % Place 1 application into the nose at bedtime as needed. 22 g 0  . nitroGLYCERIN (NITRODUR - DOSED IN MG/24 HR) 0.2 mg/hr patch     . ondansetron (ZOFRAN) 4 MG tablet Take 1 tablet (4 mg total) by mouth every 8 (eight) hours as needed for nausea. 60 tablet 1  . oxyCODONE-acetaminophen (PERCOCET/ROXICET) 5-325 MG per tablet Take 1-2 tablets by mouth every 4 (four) hours as needed for severe pain.    . pantoprazole (PROTONIX) 40 MG tablet TAKE 1 TABLET (40 MG TOTAL) BY MOUTH DAILY. 90 tablet 1  . Probiotic Product (ALIGN) 4 MG CAPS Take 1 capsule by mouth daily.      . sertraline (ZOLOFT) 50 MG tablet TAKE 1 TABLET (50 MG TOTAL) BY MOUTH DAILY. 90 tablet 1  . sitaGLIPtin (JANUVIA) 100 MG tablet Take 1 tablet (100 mg total) by mouth daily. 90 tablet 0  . valACYclovir (VALTREX) 1000 MG tablet Take 1 tablet (1,000 mg total) by mouth 2 (two) times daily. 60 tablet 0  . vitamin C (ASCORBIC ACID) 500 MG tablet Take 500 mg by mouth daily.    Marland Kitchen docusate sodium (COLACE) 100 MG capsule Take 100 mg by mouth daily.     No facility-administered medications prior to visit.    Allergies  Allergen Reactions  . Compazine [Prochlorperazine Edisylate]   . Prochlorperazine Edisylate Other (See Comments)    coma for 3 days  . Tetanus Toxoid Other (See Comments)    convulsions    Review of Systems  Constitutional: Negative for fever and malaise/fatigue.    HENT: Negative for congestion.   Eyes: Negative for discharge.  Respiratory:  Negative for shortness of breath.   Cardiovascular: Negative for chest pain, palpitations and leg swelling.  Gastrointestinal: Positive for abdominal pain. Negative for nausea.  Genitourinary: Negative for dysuria.  Musculoskeletal: Positive for joint pain. Negative for falls and neck pain.  Skin: Negative for rash.  Neurological: Negative for loss of consciousness and headaches.  Endo/Heme/Allergies: Negative for environmental allergies.  Psychiatric/Behavioral: Negative for depression. The patient is not nervous/anxious.        Objective:    Physical Exam  Constitutional: She is oriented to person, place, and time. She appears well-developed and well-nourished. No distress.  HENT:  Head: Normocephalic and atraumatic.  Nose: Nose normal.  Eyes: Right eye exhibits no discharge. Left eye exhibits no discharge.  Neck: Normal range of motion. Neck supple.  Cardiovascular: Normal rate and regular rhythm.   No murmur heard. Pulmonary/Chest: Effort normal and breath sounds normal.  Abdominal: Soft. Bowel sounds are normal. There is no tenderness.  Musculoskeletal: She exhibits no edema.  Neurological: She is alert and oriented to person, place, and time.  Skin: Skin is warm and dry.  Psychiatric: She has a normal mood and affect.  Nursing note and vitals reviewed.   BP 112/72 mmHg  Pulse 85  Temp(Src) 97.9 F (36.6 C) (Oral)  Ht 5' 3" (1.6 m)  Wt 151 lb 2 oz (68.55 kg)  BMI 26.78 kg/m2  SpO2 94% Wt Readings from Last 3 Encounters:  04/30/15 151 lb 2 oz (68.55 kg)  04/02/15 154 lb (69.854 kg)  03/12/15 156 lb 4 oz (70.875 kg)     Lab Results  Component Value Date   WBC 6.9 04/23/2015   HGB 14.4 04/23/2015   HCT 42.7 04/23/2015   PLT 237.0 04/23/2015   GLUCOSE 144* 04/23/2015   CHOL 145 04/23/2015   TRIG 147.0 04/23/2015   HDL 45.30 04/23/2015   LDLDIRECT 123.0 09/18/2014   LDLCALC 70  04/23/2015   ALT 21 04/23/2015   AST 16 04/23/2015   NA 141 04/23/2015   K 4.2 04/23/2015   CL 107 04/23/2015   CREATININE 0.67 04/23/2015   BUN 18 04/23/2015   CO2 27 04/23/2015   TSH 3.75 04/23/2015   HGBA1C 7.1* 04/23/2015   MICROALBUR 1.3 04/30/2015    Lab Results  Component Value Date   TSH 3.75 04/23/2015   Lab Results  Component Value Date   WBC 6.9 04/23/2015   HGB 14.4 04/23/2015   HCT 42.7 04/23/2015   MCV 92.0 04/23/2015   PLT 237.0 04/23/2015   Lab Results  Component Value Date   NA 141 04/23/2015   K 4.2 04/23/2015   CO2 27 04/23/2015   GLUCOSE 144* 04/23/2015   BUN 18 04/23/2015   CREATININE 0.67 04/23/2015   BILITOT 0.5 04/23/2015   ALKPHOS 70 04/23/2015   AST 16 04/23/2015   ALT 21 04/23/2015   PROT 7.2 04/23/2015   ALBUMIN 4.1 04/23/2015   CALCIUM 9.2 04/23/2015   ANIONGAP 7 01/08/2015   GFR 93.60 04/23/2015   Lab Results  Component Value Date   CHOL 145 04/23/2015   Lab Results  Component Value Date   HDL 45.30 04/23/2015   Lab Results  Component Value Date   LDLCALC 70 04/23/2015   Lab Results  Component Value Date   TRIG 147.0 04/23/2015   Lab Results  Component Value Date   CHOLHDL 3 04/23/2015   Lab Results  Component Value Date   HGBA1C 7.1* 04/23/2015       Assessment & Plan:  Problem List Items Addressed This Visit    Osteopenia    Encouraged vitamin d supplements. Calcium 1500 mg divided tid and increase regular exercise.       Hyperlipidemia    Tolerating statin, encouraged heart healthy diet, avoid trans fats, minimize simple carbs and saturated fats. Increase exercise as tolerated      Relevant Orders   TSH   CBC   Hemoglobin A1c   Hepatitis C antibody (Completed)   Microalbumin / creatinine urine ratio (Completed)   Comprehensive metabolic panel   Lipid panel   GERD (gastroesophageal reflux disease)    Avoid offending foods, take probiotics. Do not eat large meals in late evening and consider  raising head of bed.       Relevant Orders   TSH   CBC   Hemoglobin A1c   Hepatitis C antibody (Completed)   Microalbumin / creatinine urine ratio (Completed)   Comprehensive metabolic panel   Lipid panel   Fatty liver   Relevant Orders   TSH   CBC   Hemoglobin A1c   Hepatitis C antibody (Completed)   Microalbumin / creatinine urine ratio (Completed)   Comprehensive metabolic panel   Lipid panel   Essential hypertension    Well controlled, no changes to meds. Encouraged heart healthy diet such as the DASH diet and exercise as tolerated.       Relevant Orders   TSH   CBC   Hemoglobin A1c   Hepatitis C antibody (Completed)   Microalbumin / creatinine urine ratio (Completed)   Comprehensive metabolic panel   Lipid panel   DM (diabetes mellitus), type 2 with neurological complications (HCC)    [id number] acceptable, minimize simple carbs. Increase exercise as tolerated. Continue current meds      Relevant Orders   TSH   CBC   Hemoglobin A1c   Hepatitis C antibody (Completed)   Microalbumin / creatinine urine ratio (Completed)   Comprehensive metabolic panel   Lipid panel    Other Visit Diagnoses    Diabetes mellitus type 2 in obese ([place])    -  Primary    Relevant Orders    Amb ref to Medical Nutrition Therapy-MNT    TSH    CBC    Hemoglobin A1c    Hepatitis C antibody (Completed)    Microalbumin / creatinine urine ratio (Completed)    Comprehensive metabolic panel    Lipid panel    Fatty infiltration of liver        Relevant Orders    Amb ref to Medical Nutrition Therapy-MNT    TSH    CBC    Hemoglobin A1c    Hepatitis C antibody (Completed)    Microalbumin / creatinine urine ratio (Completed)    Comprehensive metabolic panel    Lipid panel    Decreased visual acuity        Relevant Orders    Ambulatory referral to Ophthalmology    TSH    CBC    Hemoglobin A1c    Hepatitis C antibody (Completed)    Microalbumin / creatinine urine ratio (Completed)     Comprehensive metabolic panel    Lipid panel    Preventative health care        Relevant Orders    Hepatitis C antibody (Completed)       I have discontinued Ms. Eshbach's docusate sodium. I am also having her maintain her Multiple Vitamins-Minerals (CENTRUM SILVER PO), ALIGN, ondansetron, ACCU-CHEK AVIVA PLUS, aspirin, nitroGLYCERIN, clotrimazole-betamethasone, ACCU-CHEK FASTCLIX  LANCETS, albuterol, fluticasone, sitaGLIPtin, mometasone, glucose blood, alosetron, dicyclomine, escitalopram, beclomethasone, mupirocin ointment, atorvastatin, fenofibrate, pantoprazole, valACYclovir, oxyCODONE-acetaminophen, methocarbamol, vitamin C, sertraline, INVOKANA, lisinopril, glimepiride, and diazepam.  No orders of the defined types were placed in this encounter.     Penni Homans, MD

## 2015-05-01 LAB — HEPATITIS C ANTIBODY: HCV AB: NEGATIVE

## 2015-05-06 NOTE — Assessment & Plan Note (Signed)
Encouraged vitamin d supplements. Calcium 1500 mg divided tid and increase regular exercise.

## 2015-05-06 NOTE — Assessment & Plan Note (Signed)
hgba1c acceptable, minimize simple carbs. Increase exercise as tolerated. Continue current meds 

## 2015-05-07 LAB — HM DIABETES EYE EXAM

## 2015-05-08 ENCOUNTER — Encounter: Payer: Self-pay | Admitting: Family Medicine

## 2015-06-09 ENCOUNTER — Encounter (HOSPITAL_BASED_OUTPATIENT_CLINIC_OR_DEPARTMENT_OTHER): Payer: Self-pay | Admitting: Emergency Medicine

## 2015-06-09 ENCOUNTER — Emergency Department (HOSPITAL_BASED_OUTPATIENT_CLINIC_OR_DEPARTMENT_OTHER)
Admission: EM | Admit: 2015-06-09 | Discharge: 2015-06-09 | Disposition: A | Discharge status: Home / Self Care | Payer: PPO | Attending: Emergency Medicine | Admitting: Emergency Medicine

## 2015-06-09 ENCOUNTER — Emergency Department (HOSPITAL_BASED_OUTPATIENT_CLINIC_OR_DEPARTMENT_OTHER): Payer: PPO

## 2015-06-09 DIAGNOSIS — F329 Major depressive disorder, single episode, unspecified: Secondary | ICD-10-CM | POA: Diagnosis not present

## 2015-06-09 DIAGNOSIS — F419 Anxiety disorder, unspecified: Secondary | ICD-10-CM | POA: Insufficient documentation

## 2015-06-09 DIAGNOSIS — Z7982 Long term (current) use of aspirin: Secondary | ICD-10-CM | POA: Insufficient documentation

## 2015-06-09 DIAGNOSIS — Y9301 Activity, walking, marching and hiking: Secondary | ICD-10-CM | POA: Diagnosis not present

## 2015-06-09 DIAGNOSIS — Z8669 Personal history of other diseases of the nervous system and sense organs: Secondary | ICD-10-CM | POA: Diagnosis not present

## 2015-06-09 DIAGNOSIS — K219 Gastro-esophageal reflux disease without esophagitis: Secondary | ICD-10-CM | POA: Diagnosis not present

## 2015-06-09 DIAGNOSIS — Y998 Other external cause status: Secondary | ICD-10-CM | POA: Insufficient documentation

## 2015-06-09 DIAGNOSIS — Y9289 Other specified places as the place of occurrence of the external cause: Secondary | ICD-10-CM | POA: Insufficient documentation

## 2015-06-09 DIAGNOSIS — E119 Type 2 diabetes mellitus without complications: Secondary | ICD-10-CM | POA: Diagnosis not present

## 2015-06-09 DIAGNOSIS — J45909 Unspecified asthma, uncomplicated: Secondary | ICD-10-CM | POA: Insufficient documentation

## 2015-06-09 DIAGNOSIS — S20211A Contusion of right front wall of thorax, initial encounter: Secondary | ICD-10-CM | POA: Insufficient documentation

## 2015-06-09 DIAGNOSIS — Z79899 Other long term (current) drug therapy: Secondary | ICD-10-CM | POA: Insufficient documentation

## 2015-06-09 DIAGNOSIS — W1839XA Other fall on same level, initial encounter: Secondary | ICD-10-CM | POA: Insufficient documentation

## 2015-06-09 DIAGNOSIS — E785 Hyperlipidemia, unspecified: Secondary | ICD-10-CM | POA: Diagnosis not present

## 2015-06-09 DIAGNOSIS — H269 Unspecified cataract: Secondary | ICD-10-CM | POA: Diagnosis not present

## 2015-06-09 DIAGNOSIS — E7800 Pure hypercholesterolemia, unspecified: Secondary | ICD-10-CM | POA: Diagnosis not present

## 2015-06-09 DIAGNOSIS — Z7951 Long term (current) use of inhaled steroids: Secondary | ICD-10-CM | POA: Diagnosis not present

## 2015-06-09 DIAGNOSIS — Z8739 Personal history of other diseases of the musculoskeletal system and connective tissue: Secondary | ICD-10-CM | POA: Insufficient documentation

## 2015-06-09 DIAGNOSIS — S29001A Unspecified injury of muscle and tendon of front wall of thorax, initial encounter: Secondary | ICD-10-CM | POA: Diagnosis present

## 2015-06-09 DIAGNOSIS — I10 Essential (primary) hypertension: Secondary | ICD-10-CM | POA: Insufficient documentation

## 2015-06-09 MED ORDER — HYDROCODONE-ACETAMINOPHEN 5-325 MG PO TABS: ORAL_TABLET | ORAL | Status: DC

## 2015-06-09 NOTE — Discharge Instructions (Signed)
Please read and follow all provided instructions.  Your diagnoses today include:  1. Rib contusion, right, initial encounter     Tests performed today include:  Chest x-ray - no obvious broken ribs or lung injury  Vital signs. See below for your results today.   Medications prescribed:   Vicodin (hydrocodone/acetaminophen) - narcotic pain medication  DO NOT drive or perform any activities that require you to be awake and alert because this medicine can make you drowsy. BE VERY CAREFUL not to take multiple medicines containing Tylenol (also called acetaminophen). Doing so can lead to an overdose which can damage your liver and cause liver failure and possibly death.  Take any prescribed medications only as directed.  Home care instructions:  Follow any educational materials contained in this packet.  Take 10 deep breaths every hour while awake. This helps to expand your lungs and prevent infections like pneumonia.   Follow-up instructions: Please follow-up with your primary care provider in the next 3 days for further evaluation of your symptoms.   Return instructions:   Please return to the Emergency Department if you experience worsening symptoms.   Please return if you have any other emergent concerns.  Additional Information:  Your vital signs today were: BP 147/74 mmHg   Pulse 75   Temp(Src) 97.8 F (36.6 C) (Oral)   Resp 18   Ht 5\' 3"  (1.6 m)   Wt 68.04 kg   BMI 26.58 kg/m2   SpO2 95% If your blood pressure (BP) was elevated above 135/85 this visit, please have this repeated by your doctor within one month. --------------

## 2015-06-09 NOTE — ED Notes (Signed)
Pt reports falling on concrete step while walking her dog, pt fell forward and hurt right breast

## 2015-06-09 NOTE — ED Provider Notes (Signed)
CSN: [id number]     Arrival date & time 06/09/15  1141 History   First MD Initiated Contact with Patient 06/09/15 1327     Chief Complaint  Patient presents with  . Fall     (Consider location/radiation/quality/duration/timing/severity/associated sxs/prior Treatment) HPI Comments: Patient presents with complaint of right anterior chest wall pain after fall sustained 2 days ago. Patient states that she was walking 3 dogs, one 1 of them pulled on the chain and pulled forward. Patient fell straight down. She has had pain beneath her right breast. Pain is sharp and nonradiating. It is worse when she pushes on the area or takes a deep breath in. No cough or shortness of breath. No hemoptysis. No fever. Patient has been taking Aleve, up to 4 at a time, without relief. Onset of symptoms acute. Course is constant.  Patient is a 64 y.o. female presenting with fall. The history is provided by the patient.  Fall Associated symptoms include chest pain (chest wall) and myalgias. Pertinent negatives include no abdominal pain, coughing, fever, headaches, nausea, rash or vomiting.    Past Medical History  Diagnosis Date  . Hypertension   . Hyperlipemia   . Dyspepsia   . OSA (obstructive sleep apnea)   . Plantar fasciitis   . Depression   . Asthma     02/08/10 FEV1 1.98 (80%), s/p saba 2.22 l/m (90%).nml  . Anxiety   . 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 Fatty liver 12/19/2010  . Internal hemorrhoids   . Diverticulosis   . Allergy     dust, cock roach,grass,weeds,molds  . GERD (gastroesophageal reflux disease)   . Acute peptic ulcer of stomach     As a teenager  . IBS (irritable bowel syndrome)   . Cataract   . High cholesterol    Past Surgical History  Procedure Laterality Date  . Knee arthroscopy Left 1998  . Toe surgery Right 1995  . Nasal septum surgery    . Rhinoplasty    . Appendectomy      age 60  . Colonoscopy  06/03/2010, 08/25/2011    diverticulosis   . Gastrectomy       partial - ulcer as a teen  . Hand surgery Right    Family History  Problem Relation Age of Onset  . Diabetes Mother     type II  . Heart attack Mother 70  . Ulcers      bleeding gastric  . Heart defect      child  . Other      perforated bowel, child  . Colon cancer Sister   . Cancer Sister     colon cancer  . Asthma Maternal Grandmother   . Mental retardation Son     PTSD  . Cancer Sister     stage 4 bladder cancer   Social History  Substance Use Topics  . Smoking status: Never Smoker   . Smokeless tobacco: Never Used  . Alcohol Use: No   OB History    Gravida Para Term Preterm AB TAB SAB Ectopic Multiple Living   4 4              Obstetric Comments   1 child died at 45 weeks (heart defects, perforated bowel)     Review of Systems  Constitutional: Negative for fever.  Eyes: Negative for redness.  Respiratory: Negative for cough, shortness of breath and wheezing.   Cardiovascular: Positive for chest pain (chest wall).  Gastrointestinal: Negative for nausea, vomiting and abdominal pain.  Musculoskeletal: Positive for myalgias.  Skin: Negative for rash.  Neurological: Negative for headaches.      Allergies  Compazine; Prochlorperazine edisylate; and Tetanus toxoid  Home Medications   Prior to Admission medications   Medication Sig Start Date End Date Taking? Authorizing Provider  ACCU-CHEK FASTCLIX LANCETS MISC Check Blood Glucose bid  DX 250.81 02/03/14   [name], MD  albuterol (PROAIR HFA) 108 (90 BASE) MCG/ACT inhaler Inhale 2 puffs into the lungs every 4 (four) hours as needed. Shortness of breath and wheezing 02/03/14   [name], MD  alosetron (LOTRONEX) 1 MG tablet Take 1 tablet (1 mg total) by mouth 2 (two) times daily. And as directed 04/18/14   Gatha Mayer, MD  aspirin 81 MG tablet Take 81 mg by mouth daily.    Historical Provider, MD  atorvastatin (LIPITOR) 80 MG tablet TAKE 1 TABLET (80 MG) BY MOUTH DAILY. 10/17/14   [name], MD   beclomethasone (QVAR) 40 MCG/ACT inhaler Inhale 2 puffs into the lungs 2 (two) times daily. 08/03/14   Percell Miller Saguier, PA-C  Blood Glucose Monitoring Suppl (ACCU-CHEK AVIVA PLUS) W/DEVICE KIT 1 kit by Does not apply route daily. DX 250.00 08/06/12   Debbrah Alar, NP  clotrimazole-betamethasone (LOTRISONE) cream  09/01/13   Historical Provider, MD  diazepam (VALIUM) 5 MG tablet Take 0.5 to 1 tablet by mouth twice daily as needed for anxiety 04/12/15   [name], MD  dicyclomine (BENTYL) 20 MG tablet Take 1 tablet (20 mg total) by mouth every 6 (six) hours as needed (cramps/diarrhea). 04/25/14   Gatha Mayer, MD  escitalopram (LEXAPRO) 10 MG tablet TAKE 1 TABLET (10 MG TOTAL) BY MOUTH DAILY. 05/08/14   [name], MD  fenofibrate 160 MG tablet TAKE 1 TABLET (160 MG TOTAL) BY MOUTH AT BEDTIME. 11/07/14   [name], MD  fluticasone (FLONASE) 50 MCG/ACT nasal spray Place 1 spray into both nostrils daily. 02/03/14   [name], MD  glimepiride (AMARYL) 2 MG tablet TAKE 1 TABLET BY MOUTH DAILY WITH BREAKFAST 04/11/15   [name], MD  glucose blood (ACCU-CHEK AVIVA PLUS) test strip TEST BLOOD SUGARS 1 TO 2 TIMES A DAY. Dx 250.00 02/03/14   [name], MD  HYDROcodone-acetaminophen (NORCO/VICODIN) 5-325 MG tablet Take 1-2 tablets every 6 hours as needed for severe pain 06/09/15   Carlisle Cater, PA-C  INVOKANA 100 MG TABS tablet TAKE 1 TABLET (100 MG TOTAL) BY MOUTH DAILY. 02/06/15   [name], MD  lisinopril (PRINIVIL,ZESTRIL) 2.5 MG tablet TAKE 1 TABLET (2.5 MG TOTAL) BY MOUTH DAILY. 02/06/15   [name], MD  methocarbamol (ROBAXIN) 500 MG tablet Take 500 mg by mouth every 6 (six) hours as needed for muscle spasms.    Historical Provider, MD  mometasone (NASONEX) 50 MCG/ACT nasal spray Place 2 sprays into the nose daily as needed. 02/03/14   [name], MD  Multiple Vitamins-Minerals (CENTRUM SILVER PO) Take 1 tablet by mouth daily.      Historical Provider, MD   mupirocin ointment (BACTROBAN) 2 % Place 1 application into the nose at bedtime as needed. 09/18/14   [name], MD  nitroGLYCERIN (NITRODUR - DOSED IN MG/24 HR) 0.2 mg/hr patch  10/31/13   Historical Provider, MD  ondansetron (ZOFRAN) 4 MG tablet Take 1 tablet (4 mg total) by mouth every 8 (eight) hours as needed for nausea.  06/24/12   Gatha Mayer, MD  oxyCODONE-acetaminophen (PERCOCET/ROXICET) 5-325 MG per tablet Take 1-2 tablets by mouth every 4 (four) hours as needed for severe pain.    Historical Provider, MD  pantoprazole (PROTONIX) 40 MG tablet TAKE 1 TABLET (40 MG TOTAL) BY MOUTH DAILY. 12/07/14   [name], MD  Probiotic Product (ALIGN) 4 MG CAPS Take 1 capsule by mouth daily.      Historical Provider, MD  sertraline (ZOLOFT) 50 MG tablet TAKE 1 TABLET (50 MG TOTAL) BY MOUTH DAILY. 02/06/15   [name], MD  sitaGLIPtin (JANUVIA) 100 MG tablet Take 1 tablet (100 mg total) by mouth daily. 02/03/14   [name], MD  valACYclovir (VALTREX) 1000 MG tablet Take 1 tablet (1,000 mg total) by mouth 2 (two) times daily. 12/07/14   [name], MD  vitamin C (ASCORBIC ACID) 500 MG tablet Take 500 mg by mouth daily.    Historical Provider, MD   BP 147/74 mmHg  Pulse 75  Temp(Src) 97.8 F (36.6 C) (Oral)  Resp 18  Ht '5\' 3"'  (1.6 m)  Wt 68.04 kg  BMI 26.58 kg/m2  SpO2 95% Physical Exam  Constitutional: She appears well-developed and well-nourished.  HENT:  Head: Normocephalic and atraumatic.  Eyes: Conjunctivae are normal.  Neck: Normal range of motion. Neck supple.  Cardiovascular: Normal rate and regular rhythm.   No murmur heard. Pulmonary/Chest: Effort normal and breath sounds normal. No respiratory distress. She has no wheezes. She has no rales. She exhibits tenderness.  Tenderness of anterior ribs to palpation beneath right breast. No deformities noted.  Abdominal: Soft. Bowel sounds are normal. She exhibits no distension. There is no tenderness. There is no rebound.   Neurological: She is alert.  Skin: Skin is warm and dry.  Psychiatric: She has a normal mood and affect.  Nursing note and vitals reviewed.   ED Course  Procedures (including critical care time) Labs Review Labs Reviewed - No data to display  Imaging Review Dg Chest 2 View  06/09/2015  CLINICAL DATA:  [place] on Wednesday with anterior lower chest pain. EXAM: CHEST  2 VIEW COMPARISON:  04/08/2011 FINDINGS: Mild elevation the right hemidiaphragm appears unchanged. Again noted are surgical clips in the upper abdomen. Coarse lung markings are chronic without focal airspace disease or pulmonary edema. Negative for a pneumothorax. Heart and mediastinum are within normal limits. The trachea is midline. No large pleural effusions. No acute bone abnormality. Evidence for scoliosis near the thoracolumbar junction. IMPRESSION: No active cardiopulmonary disease. Electronically Signed   By: Markus Daft M.D.   On: 06/09/2015 12:29   I have personally reviewed and evaluated these images and lab results as part of my medical decision-making.   EKG Interpretation None       2:15 PM Patient seen and examined.   Vital signs reviewed and are as follows: BP 147/74 mmHg  Pulse 75  Temp(Src) 97.8 F (36.6 C) (Oral)  Resp 18  Ht '5\' 3"'  (1.6 m)  Wt 68.04 kg  BMI 26.58 kg/m2  SpO2 95%  Will discharge to home with Vicodin for pain control. Patient counseled to continue NSAIDs as prescribed on the packaging and not to take too much.  Encouraged PCP follow-up if continued trouble with pain in one week.  Counseled patient to take 10 deep breaths every hour while awake. This helps to expand your lungs and prevent infections like pneumonia. Return with uncontrolled pain, fever, difficulty breathing, shortness breath or other concerns.  MDM   Final diagnoses:  Rib contusion, right, initial encounter   Patient with right anterior chest wall pain, likely rib contusion. X-ray is negative for depressed  rib fracture. Patient is in no respiratory distress. No evidence of infection. Symptoms are not suspicious for ACS.    Carlisle Cater, PA-C 06/09/15 1416  Gareth Morgan, MD 06/15/15 1249

## 2015-06-19 ENCOUNTER — Ambulatory Visit: Payer: PPO | Admitting: Dietician

## 2015-06-26 ENCOUNTER — Encounter: Payer: Self-pay | Admitting: Family Medicine

## 2015-06-29 ENCOUNTER — Telehealth: Payer: Self-pay | Admitting: Behavioral Health

## 2015-06-29 NOTE — Telephone Encounter (Signed)
Attempted to reach patient to confirm appointment for Medicare Wellness visit, 06/29/15 at 10:15 AM. Left message for patient to return call when available.

## 2015-07-02 ENCOUNTER — Ambulatory Visit (INDEPENDENT_AMBULATORY_CARE_PROVIDER_SITE_OTHER): Payer: PPO

## 2015-07-02 VITALS — BP 128/72 | HR 71 | Ht 63.0 in | Wt 150.4 lb

## 2015-07-02 DIAGNOSIS — Z0000 Encounter for general adult medical examination without abnormal findings: Secondary | ICD-10-CM | POA: Diagnosis not present

## 2015-07-02 NOTE — Patient Instructions (Addendum)
Schedule an appt with Dr. Elsworth Soho.    Eat heart healthy diet (full of fruits, vegetables, whole grains, lean protein, water--limit salt, fat, and sugar intake) and increase physical activity as tolerated.  Continue doing brain stimulating activities (puzzles, reading, adult coloring books, staying active) to keep memory sharp.   Follow up with Dr. Charlett Blake as scheduled.    Diabetes and Foot Care Diabetes may cause you to have problems because of poor blood supply (circulation) to your feet and legs. This may cause the skin on your feet to become thinner, break easier, and heal more slowly. Your skin may become dry, and the skin may peel and crack. You may also have nerve damage in your legs and feet causing decreased feeling in them. You may not notice minor injuries to your feet that could lead to infections or more serious problems. Taking care of your feet is one of the most important things you can do for yourself.  HOME CARE INSTRUCTIONS  Wear shoes at all times, even in the house. Do not go barefoot. Bare feet are easily injured.  Check your feet daily for blisters, cuts, and redness. If you cannot see the bottom of your feet, use a mirror or ask someone for help.  Wash your feet with warm water (do not use hot water) and mild soap. Then pat your feet and the areas between your toes until they are completely dry. Do not soak your feet as this can dry your skin.  Apply a moisturizing lotion or petroleum jelly (that does not contain alcohol and is unscented) to the skin on your feet and to dry, brittle toenails. Do not apply lotion between your toes.  Trim your toenails straight across. Do not dig under them or around the cuticle. File the edges of your nails with an emery board or nail file.  Do not cut corns or calluses or try to remove them with medicine.  Wear clean socks or stockings every day. Make sure they are not too tight. Do not wear knee-high stockings since they may decrease blood  flow to your legs.  Wear shoes that fit properly and have enough cushioning. To break in new shoes, wear them for just a few hours a day. This prevents you from injuring your feet. Always look in your shoes before you put them on to be sure there are no objects inside.  Do not cross your legs. This may decrease the blood flow to your feet.  If you find a minor scrape, cut, or break in the skin on your feet, keep it and the skin around it clean and dry. These areas may be cleansed with mild soap and water. Do not cleanse the area with peroxide, alcohol, or iodine.  When you remove an adhesive bandage, be sure not to damage the skin around it.  If you have a wound, look at it several times a day to make sure it is healing.  Do not use heating pads or hot water bottles. They may burn your skin. If you have lost feeling in your feet or legs, you may not know it is happening until it is too late.  Make sure your health care provider performs a complete foot exam at least annually or more often if you have foot problems. Report any cuts, sores, or bruises to your health care provider immediately. SEEK MEDICAL CARE IF:   You have an injury that is not healing.  You have cuts or breaks in the  skin.  You have an ingrown nail.  You notice redness on your legs or feet.  You feel burning or tingling in your legs or feet.  You have pain or cramps in your legs and feet.  Your legs or feet are numb.  Your feet always feel cold. SEEK IMMEDIATE MEDICAL CARE IF:   There is increasing redness, swelling, or pain in or around a wound.  There is a red line that goes up your leg.  Pus is coming from a wound.  You develop a fever or as directed by your health care provider.  You notice a bad smell coming from an ulcer or wound.   This information is not intended to replace advice given to you by your health care provider. Make sure you discuss any questions you have with your health care  provider.   Document Released: 06/27/2000 Document Revised: 03/02/2013 Document Reviewed: 12/07/2012 Elsevier Interactive Patient Education 2016 [place] in the Home  Falls can cause injuries. They can happen to people of all ages. There are many things you can do to make your home safe and to help prevent falls.  WHAT CAN I DO ON THE OUTSIDE OF MY HOME?  Regularly fix the edges of walkways and driveways and fix any cracks.  Remove anything that might make you trip as you walk through a door, such as a raised step or threshold.  Trim any bushes or trees on the path to your home.  Use bright outdoor lighting.  Clear any walking paths of anything that might make someone trip, such as rocks or tools.  Regularly check to see if handrails are loose or broken. Make sure that both sides of any steps have handrails.  Any raised decks and porches should have guardrails on the edges.  Have any leaves, snow, or ice cleared regularly.  Use sand or salt on walking paths during winter.  Clean up any spills in your garage right away. This includes oil or grease spills. WHAT CAN I DO IN THE BATHROOM?   Use night lights.  Install grab bars by the toilet and in the tub and shower. Do not use towel bars as grab bars.  Use non-skid mats or decals in the tub or shower.  If you need to sit down in the shower, use a plastic, non-slip stool.  Keep the floor dry. Clean up any water that spills on the floor as soon as it happens.  Remove soap buildup in the tub or shower regularly.  Attach bath mats securely with double-sided non-slip rug tape.  Do not have throw rugs and other things on the floor that can make you trip. WHAT CAN I DO IN THE BEDROOM?  Use night lights.  Make sure that you have a light by your bed that is easy to reach.  Do not use any sheets or blankets that are too big for your bed. They should not hang down onto the floor.  Have a firm chair that  has side arms. You can use this for support while you get dressed.  Do not have throw rugs and other things on the floor that can make you trip. WHAT CAN I DO IN THE KITCHEN?  Clean up any spills right away.  Avoid walking on wet floors.  Keep items that you use a lot in easy-to-reach places.  If you need to reach something above you, use a strong step stool that has a grab bar.  Keep  electrical cords out of the way.  Do not use floor polish or wax that makes floors slippery. If you must use wax, use non-skid floor wax.  Do not have throw rugs and other things on the floor that can make you trip. WHAT CAN I DO WITH MY STAIRS?  Do not leave any items on the stairs.  Make sure that there are handrails on both sides of the stairs and use them. Fix handrails that are broken or loose. Make sure that handrails are as long as the stairways.  Check any carpeting to make sure that it is firmly attached to the stairs. Fix any carpet that is loose or worn.  Avoid having throw rugs at the top or bottom of the stairs. If you do have throw rugs, attach them to the floor with carpet tape.  Make sure that you have a light switch at the top of the stairs and the bottom of the stairs. If you do not have them, ask someone to add them for you. WHAT ELSE CAN I DO TO HELP PREVENT FALLS?  Wear shoes that:  Do not have high heels.  Have rubber bottoms.  Are comfortable and fit you well.  Are closed at the toe. Do not wear sandals.  If you use a stepladder:  Make sure that it is fully opened. Do not climb a closed stepladder.  Make sure that both sides of the stepladder are locked into place.  Ask someone to hold it for you, if possible.  Clearly mark and make sure that you can see:  Any grab bars or handrails.  First and last steps.  Where the edge of each step is.  Use tools that help you move around (mobility aids) if they are needed. These  include:  Canes.  Walkers.  Scooters.  Crutches.  Turn on the lights when you go into a dark area. Replace any light bulbs as soon as they burn out.  Set up your furniture so you have a clear path. Avoid moving your furniture around.  If any of your floors are uneven, fix them.  If there are any pets around you, be aware of where they are.  Review your medicines with your doctor. Some medicines can make you feel dizzy. This can increase your chance of falling. Ask your doctor what other things that you can do to help prevent falls.   This information is not intended to replace advice given to you by your health care provider. Make sure you discuss any questions you have with your health care provider.   Document Released: 04/26/2009 Document Revised: 11/14/2014 Document Reviewed: 08/04/2014 Elsevier Interactive Patient Education [employer].

## 2015-07-02 NOTE — Progress Notes (Signed)
RN AWV note reviewed. Agree with documention and plan. 

## 2015-07-02 NOTE — Progress Notes (Signed)
Subjective:   Crystal [name] is a 64 y.o. female who presents for Medicare Annual (Subsequent) preventive examination.  Review of Systems: No ROS Cardiac Risk Factors include: advanced age (>23mn, >>6women);diabetes mellitus;hypertension   Sleep patterns:  Sleeps at least 7 hours per night/gets up 1-2 times per night to void//Has Sleep Apnea but does not wear CPAP.   Home Safety/Smoke Alarms:  Feels safe at home. Lives with daughter and 2 grandsons.  Smokes alarms present.  Firearm Safety:  No firearms.   Seat Belt Safety/Bike Helmet:   Always wears seat belt.    Counseling:   Eye Exam- 05/07/15-no diabetic retinopathy   Foot Exam-completed during this visit.   Dental- Does not have dental insurance.   Female:  Pap-09/05/13      Mammo-04/2015-normal      Dexa scan-09/15/13-osteopenia       CCS- 08/25/11-normal; repeat in 10 years      Objective:     Vitals: BP 128/72 mmHg  Pulse 71  Ht '5\' 3"'  (1.6 m)  Wt 150 lb 6.4 oz (68.221 kg)  BMI 26.65 kg/m2  SpO2 98%  Tobacco History  Smoking status  . Never Smoker   Smokeless tobacco  . Never Used     Counseling given: No   Past Medical History  Diagnosis Date  . Hypertension   . Hyperlipemia   . Dyspepsia   . OSA (obstructive sleep apnea)   . Plantar fasciitis   . Depression   . Asthma     02/08/10 FEV1 1.98 (80%), s/p saba 2.22 l/m (90%).nml  . Anxiety   . 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 Fatty liver 12/19/2010  . Internal hemorrhoids   . Diverticulosis   . Allergy     dust, cock roach,grass,weeds,molds  . GERD (gastroesophageal reflux disease)   . Acute peptic ulcer of stomach     As a teenager  . IBS (irritable bowel syndrome)   . Cataract   . High cholesterol    Past Surgical History  Procedure Laterality Date  . Knee arthroscopy Left 1998  . Toe surgery Right 1995  . Nasal septum surgery    . Rhinoplasty    . Appendectomy      age 64 . Colonoscopy  06/03/2010, 08/25/2011    diverticulosis     . Gastrectomy      partial - ulcer as a teen  . Hand surgery Right   . Cataract extraction, bilateral      12/8 and 12/14   Family History  Problem Relation Age of Onset  . Diabetes Mother     type II  . Heart attack Mother 534 . Ulcers      bleeding gastric  . Heart defect      child  . Other      perforated bowel, child  . Colon cancer Sister   . Cancer Sister     colon cancer  . Asthma Maternal Grandmother   . Mental retardation Son     PTSD  . Cancer Sister     stage 4 bladder cancer   History  Sexual Activity  . Sexual Activity: No    Comment: lives with her mother, no dietary restrictions    Outpatient Encounter Prescriptions as of 07/02/2015  Medication Sig  . ACCU-CHEK FASTCLIX LANCETS MISC Check Blood Glucose bid  DX 250.81  . albuterol (PROAIR HFA) 108 (90 BASE) MCG/ACT inhaler Inhale 2 puffs into the lungs every  4 (four) hours as needed. Shortness of breath and wheezing  . atorvastatin (LIPITOR) 80 MG tablet TAKE 1 TABLET (80 MG) BY MOUTH DAILY.  Marland Kitchen Blood Glucose Monitoring Suppl (ACCU-CHEK AVIVA PLUS) W/DEVICE KIT 1 kit by Does not apply route daily. DX 250.00  . clotrimazole-betamethasone (LOTRISONE) cream   . diazepam (VALIUM) 5 MG tablet Take 0.5 to 1 tablet by mouth twice daily as needed for anxiety  . dicyclomine (BENTYL) 20 MG tablet Take 1 tablet (20 mg total) by mouth every 6 (six) hours as needed (cramps/diarrhea).  . DUREZOL 0.05 % EMUL   . fenofibrate 160 MG tablet TAKE 1 TABLET (160 MG TOTAL) BY MOUTH AT BEDTIME.  . fluticasone (FLONASE) 50 MCG/ACT nasal spray Place 1 spray into both nostrils daily.  Marland Kitchen glimepiride (AMARYL) 2 MG tablet TAKE 1 TABLET BY MOUTH DAILY WITH BREAKFAST  . glucose blood (ACCU-CHEK AVIVA PLUS) test strip TEST BLOOD SUGARS 1 TO 2 TIMES A DAY. Dx 250.00  . HYDROcodone-acetaminophen (NORCO/VICODIN) 5-325 MG tablet Take 1-2 tablets every 6 hours as needed for severe pain  . lisinopril (PRINIVIL,ZESTRIL) 2.5 MG tablet TAKE 1  TABLET (2.5 MG TOTAL) BY MOUTH DAILY.  . mometasone (NASONEX) 50 MCG/ACT nasal spray Place 2 sprays into the nose daily as needed.  . Multiple Vitamins-Minerals (CENTRUM SILVER PO) Take 1 tablet by mouth daily.    . pantoprazole (PROTONIX) 40 MG tablet TAKE 1 TABLET (40 MG TOTAL) BY MOUTH DAILY.  Marland Kitchen PROLENSA 0.07 % SOLN   . sertraline (ZOLOFT) 50 MG tablet TAKE 1 TABLET (50 MG TOTAL) BY MOUTH DAILY.  . valACYclovir (VALTREX) 1000 MG tablet Take 1 tablet (1,000 mg total) by mouth 2 (two) times daily.  Marland Kitchen alosetron (LOTRONEX) 1 MG tablet Take 1 tablet (1 mg total) by mouth 2 (two) times daily. And as directed (Patient not taking: Reported on 07/02/2015)  . aspirin 81 MG tablet Take 81 mg by mouth daily. Reported on 07/02/2015  . beclomethasone (QVAR) 40 MCG/ACT inhaler Inhale 2 puffs into the lungs 2 (two) times daily. (Patient not taking: Reported on 07/02/2015)  . escitalopram (LEXAPRO) 10 MG tablet TAKE 1 TABLET (10 MG TOTAL) BY MOUTH DAILY. (Patient not taking: Reported on 07/02/2015)  . INVOKANA 100 MG TABS tablet TAKE 1 TABLET (100 MG TOTAL) BY MOUTH DAILY. (Patient not taking: Reported on 07/02/2015)  . methocarbamol (ROBAXIN) 500 MG tablet Take 500 mg by mouth every 6 (six) hours as needed for muscle spasms. Reported on 07/02/2015  . mupirocin ointment (BACTROBAN) 2 % Place 1 application into the nose at bedtime as needed. (Patient not taking: Reported on 07/02/2015)  . nitroGLYCERIN (NITRODUR - DOSED IN MG/24 HR) 0.2 mg/hr patch Reported on 07/02/2015  . ondansetron (ZOFRAN) 4 MG tablet Take 1 tablet (4 mg total) by mouth every 8 (eight) hours as needed for nausea. (Patient not taking: Reported on 07/02/2015)  . oxyCODONE-acetaminophen (PERCOCET/ROXICET) 5-325 MG per tablet Take 1-2 tablets by mouth every 4 (four) hours as needed for severe pain. Reported on 07/02/2015  . Probiotic Product (ALIGN) 4 MG CAPS Take 1 capsule by mouth daily. Reported on 07/02/2015  . [DISCONTINUED] sitaGLIPtin  (JANUVIA) 100 MG tablet Take 1 tablet (100 mg total) by mouth daily. (Patient not taking: Reported on 07/02/2015)  . [DISCONTINUED] vitamin C (ASCORBIC ACID) 500 MG tablet Take 500 mg by mouth daily. Reported on 07/02/2015   No facility-administered encounter medications on file as of 07/02/2015.    Activities of Daily Living In your present state  of health, do you have any difficulty performing the following activities: 07/02/2015 09/18/2014  Hearing? N Y  Vision? N Y  Difficulty concentrating or making decisions? Y N  Walking or climbing stairs? N N  Dressing or bathing? N N  Doing errands, shopping? N N  Preparing Food and eating ? N -  Using the Toilet? N -  In the past six months, have you accidently leaked urine? Y -  Do you have problems with loss of bowel control? Y -  Managing your Medications? N -  Managing your Finances? Y -  Housekeeping or managing your Housekeeping? N -    Patient Care Team: [name], MD as PCP - General (Family Medicine) Calvert Cantor, MD as Consulting Physician (Ophthalmology) Gatha Mayer, MD as Consulting Physician (Gastroenterology) Wallene Huh, DPM as Consulting Physician (Podiatry) Malachi Pro, MD as Referring Physician (Otolaryngology) Dene Gentry, MD as Consulting Physician (Sports Medicine) Rigoberto Noel, MD as Consulting Physician (Pulmonary Disease)    Assessment:  Obstructive sleep apnea- follows Dr. Elsworth Soho.  Pt states she has not seen him in awhile and states "I need to make an appointment with him."  Last appt: 12/01/13.  Pt encouraged to call and schedule an appointment.    Type 2 diabetes with neurological complications- Last L7L-89/21/19- 7.1.  Pt states does not monitor blood sugars often at home, but they are between 115-125 after meals.  States only taking glimepiride for blood sugar.  Unable to afford Invokana.  Encouraged a healthy diet, limiting simple carbs.  Simple foot exam completed.  Foot care and  wear discussed.    Osteopenia- Last Dexa Scan: 09/15/13.  Fall prevention discussed.  Encouraged calcium + vitamin D and weight bearing exercises.    Exercise Activities and Dietary recommendations Current Exercise Habits:: Home exercise routine, Type of exercise: walking (walking dogs ), Time (Minutes): 50, Frequency (Times/Week): 5, Weekly Exercise (Minutes/Week): 250  Diet:  Eats 3 meals per day with snacks in between.  Eats regular diet.  No restrictions.    Goals    . HEMOGLOBIN A1C < 7.0    . Prevent Falls     Fall Prevention Tip Sheet given.       Fall Risk Fall Risk  07/02/2015 06/19/2014  Falls in the past year? Yes No  Number falls in past yr: 2 or more -  Risk Factor Category  High Fall Risk -  Follow up Education provided;Falls prevention discussed -   Depression Screen PHQ 2/9 Scores 07/02/2015 06/19/2014  PHQ - 2 Score 1 2     Cognitive Testing MMSE - Mini Mental State Exam 07/02/2015  Orientation to time 5  Orientation to Place 5  Registration 3  Attention/ Calculation 5  Recall 1  Language- name 2 objects 2  Language- repeat 1  Language- follow 3 step command 3  Language- read & follow direction 1  Write a sentence 1  Copy design 1  Total score 28    Immunization History  Administered Date(s) Administered  . Influenza Split 05/26/2011, 03/31/2012  . Influenza Whole 03/27/2010  . Influenza,inj,Quad PF,36+ Mos 03/29/2013, 04/13/2014, 04/02/2015  . Pneumococcal Conjugate-13 04/25/2013  . Pneumococcal Polysaccharide-23 07/14/2004  . Zoster 12/27/2013   Screening Tests Health Maintenance  Topic Date Due  . PNA vac Low Risk Adult (2 of 2 - PPSV23) 04/25/2014  . HEMOGLOBIN A1C  10/22/2015  . INFLUENZA VACCINE  02/12/2016  . OPHTHALMOLOGY EXAM  05/06/2016  . FOOT EXAM  07/01/2016  . MAMMOGRAM  04/19/2017  . COLONOSCOPY  08/24/2021  . TETANUS/TDAP  09/10/2021  . DEXA SCAN  Completed  . ZOSTAVAX  Completed  . Hepatitis C Screening  Completed        Plan:  Schedule an appt with Dr. Elsworth Soho.    Eat heart healthy diet (full of fruits, vegetables, whole grains, lean protein, water--limit salt, fat, and sugar/simple carbs intake) and increase physical activity as tolerated.  Continue doing brain stimulating activities (puzzles, reading, adult coloring books, staying active) to keep memory sharp.   Follow up with Dr. Charlett Blake as scheduled.      During the course of the visit the patient was educated and counseled about the following appropriate screening and preventive services:   Vaccines to include Pneumoccal, Influenza, Hepatitis B, Td, Zostavax, HCV  Electrocardiogram  Cardiovascular Disease  Colorectal cancer screening  Bone density screening  Diabetes screening  Glaucoma screening  Mammography/PAP  Nutrition counseling   Patient Instructions (the written plan) was given to the patient.   Rudene Anda, RN  07/02/2015

## 2015-07-02 NOTE — Progress Notes (Signed)
Pre visit review using our clinic review tool, if applicable. No additional management support is needed unless otherwise documented below in the visit note. 

## 2015-07-11 ENCOUNTER — Other Ambulatory Visit: Payer: Self-pay | Admitting: Family Medicine

## 2015-07-11 ENCOUNTER — Telehealth: Payer: Self-pay | Admitting: Family Medicine

## 2015-07-11 MED ORDER — CANAGLIFLOZIN 100 MG PO TABS: ORAL_TABLET | ORAL | Status: DC

## 2015-07-11 NOTE — Telephone Encounter (Signed)
Relation to PO:[id number] Call back number:[phone] Pharmacy: [facility], [place] [phone] (Phone) [phone] (Fax)         Reason for call:  Patient requesting a refill INVOKANA 100 MG TABS tablet

## 2015-07-13 ENCOUNTER — Encounter: Payer: PPO | Attending: Family Medicine | Admitting: Skilled Nursing Facility1

## 2015-07-13 ENCOUNTER — Encounter: Payer: Self-pay | Admitting: Skilled Nursing Facility1

## 2015-07-13 VITALS — Ht 63.0 in | Wt 147.0 lb

## 2015-07-13 DIAGNOSIS — E1149 Type 2 diabetes mellitus with other diabetic neurological complication: Secondary | ICD-10-CM

## 2015-07-13 DIAGNOSIS — E669 Obesity, unspecified: Secondary | ICD-10-CM | POA: Diagnosis not present

## 2015-07-13 DIAGNOSIS — E1169 Type 2 diabetes mellitus with other specified complication: Secondary | ICD-10-CM | POA: Insufficient documentation

## 2015-07-13 DIAGNOSIS — K760 Fatty (change of) liver, not elsewhere classified: Secondary | ICD-10-CM | POA: Diagnosis not present

## 2015-07-13 DIAGNOSIS — Z713 Dietary counseling and surveillance: Secondary | ICD-10-CM | POA: Insufficient documentation

## 2015-07-13 DIAGNOSIS — Z6826 Body mass index (BMI) 26.0-26.9, adult: Secondary | ICD-10-CM | POA: Insufficient documentation

## 2015-07-13 NOTE — Progress Notes (Signed)
Diabetes Self-Management Education  Visit Type: First/Initial  Appt. Start Time: 8:00 Appt. End Time: 9:30  07/13/2015  Crystal [name], identified by name and date of birth, is a 64 y.o. female with a diagnosis of Diabetes: Type 2.   ASSESSMENT  Height 5\' 3"  (1.6 m), weight 147 lb (66.679 kg). Body mass index is 26.05 kg/(m^2).  Pt states she wakes up every night at least once to urinate. Pt states she is going to get another sleep study because her C-PAP stopped working so her sleep is not good. Pt has lost 10 pounds since about a month ago and states she has had no changes to her diabetes medications or her blood pressure medication. Pt lost her husband 3 years ago and now has a boyfriend. Pt enjoys baking, reading, and will like to learn how to sew.  Pt took her blood sugar in office 2 hours after she ate, it was 120.       Diabetes Self-Management Education - 07/13/15 0826    Visit Information   Visit Type First/Initial   Initial Visit   Diabetes Type Type 2   Are you currently following a meal plan? No   Are you taking your medications as prescribed? Yes   Date Diagnosed 2003   Health Coping   How would you rate your overall health? Good   Psychosocial Assessment   Patient Belief/Attitude about Diabetes Other (comment)  Angry   Self-care barriers None   Self-management support Family   Patient Concerns Nutrition/Meal planning   Special Needs None   Preferred Learning Style Auditory   Learning Readiness Ready   How often do you need to have someone help you when you read instructions, pamphlets, or other written materials from your doctor or pharmacy? 1 - Never   Complications   Last HgB A1C per patient/outside source 7.1 %   How often do you check your blood sugar? 3-4 times / week   Fasting Blood glucose range (mg/dL) 70-129   Postprandial Blood glucose range (mg/dL) 70-129   Number of hypoglycemic episodes per month 0   Number of hyperglycemic episodes per week  0   Have you had a dilated eye exam in the past 12 months? No   Have you had a dental exam in the past 12 months? No   Are you checking your feet? No   Dietary Intake   Breakfast oatmeal----toast---eggs and ham   Snack (morning) yogurt----breakfast bar   Lunch  fast food   Snack (afternoon) none   Dinner spagetti----stew   Snack (evening) none   Beverage(s) coffee, unsweet tea, water   Exercise   Exercise Type Light (walking / raking leaves)   How many days per week to you exercise? 5   How many minutes per day do you exercise? 45   Total minutes per week of exercise 225   Patient Education   Previous Diabetes Education No   Disease state  Definition of diabetes, type 1 and 2, and the diagnosis of diabetes;Factors that contribute to the development of diabetes   Nutrition management  Role of diet in the treatment of diabetes and the relationship between the three main macronutrients and blood glucose level;Food label reading, portion sizes and measuring food.;Carbohydrate counting;Reviewed blood glucose goals for pre and post meals and how to evaluate the patients' food intake on their blood glucose level.   Physical activity and exercise  Role of exercise on diabetes management, blood pressure control and cardiac health.  Monitoring Interpreting lab values - A1C, lipid, urine microalbumina.;Daily foot exams   Chronic complications Assessed and discussed foot care and prevention of foot problems;Dental care   Individualized Goals (developed by patient)   Nutrition Follow meal plan discussed;General guidelines for healthy choices and portions discussed   Physical Activity Exercise 5-7 days per week   Monitoring  test my blood glucose as discussed;test blood glucose pre and post meals as discussed   Reducing Risk increase portions of nuts and seeds;do foot checks daily   Outcomes   Expected Outcomes Demonstrated interest in learning. Expect positive outcomes   Future DMSE PRN   Program  Status Completed      Individualized Plan for Diabetes Self-Management Training:   Learning Objective:  Patient will have a greater understanding of diabetes self-management. Patient education plan is to attend individual and/or group sessions per assessed needs and concerns.   Plan:   There are no Patient Instructions on file for this visit.  Expected Outcomes:  Demonstrated interest in learning. Expect positive outcomes  Education material provided: Living Well with Diabetes, Meal plan card and Snack sheet  If problems or questions, patient to contact team via:  Phone  Future DSME appointment: PRN

## 2015-07-17 MED FILL — INVOKANA 100 MG TABLET: 100 | 30 days supply | Qty: 30 | Fill #1

## 2015-08-03 ENCOUNTER — Ambulatory Visit (INDEPENDENT_AMBULATORY_CARE_PROVIDER_SITE_OTHER): Payer: PPO | Admitting: Family Medicine

## 2015-08-03 ENCOUNTER — Encounter: Payer: Self-pay | Admitting: Family Medicine

## 2015-08-03 VITALS — BP 110/76 | HR 81 | Temp 97.7°F | Ht 63.0 in | Wt 147.2 lb

## 2015-08-03 DIAGNOSIS — L84 Corns and callosities: Secondary | ICD-10-CM

## 2015-08-03 DIAGNOSIS — A6000 Herpesviral infection of urogenital system, unspecified: Secondary | ICD-10-CM | POA: Diagnosis not present

## 2015-08-03 DIAGNOSIS — Z23 Encounter for immunization: Secondary | ICD-10-CM

## 2015-08-03 DIAGNOSIS — R32 Unspecified urinary incontinence: Secondary | ICD-10-CM

## 2015-08-03 DIAGNOSIS — E785 Hyperlipidemia, unspecified: Secondary | ICD-10-CM | POA: Diagnosis not present

## 2015-08-03 DIAGNOSIS — I10 Essential (primary) hypertension: Secondary | ICD-10-CM

## 2015-08-03 DIAGNOSIS — K581 Irritable bowel syndrome with constipation: Secondary | ICD-10-CM

## 2015-08-03 DIAGNOSIS — M8580 Other specified disorders of bone density and structure, unspecified site: Secondary | ICD-10-CM

## 2015-08-03 DIAGNOSIS — E1149 Type 2 diabetes mellitus with other diabetic neurological complication: Secondary | ICD-10-CM

## 2015-08-03 MED ORDER — LOSARTAN POTASSIUM 25 MG PO TABS: 25.0000 mg | ORAL_TABLET | Freq: Every day | ORAL | Status: DC

## 2015-08-03 MED FILL — LOSARTAN POTASSIUM 25 MG TA: 25 | 30 days supply | Qty: 30 | Fill #0

## 2015-08-03 NOTE — Patient Instructions (Addendum)
Probiotics daily, such as [place] has a 10 strain probiotics, [email or web address]    Irritable Bowel Syndrome, Adult Irritable bowel syndrome (IBS) is not one specific disease. It is a group of symptoms that affects the organs responsible for digestion (gastrointestinal or GI tract).  To regulate how your GI tract works, your body sends signals back and forth between your intestines and your brain. If you have IBS, there may be a problem with these signals. As a result, your GI tract does not function normally. Your intestines may become more sensitive and overreact to certain things. This is especially true when you eat certain foods or when you are under stress.  There are four types of IBS. These may be determined based on the consistency of your stool:  1. IBS with diarrhea.  2. IBS with constipation.  3. Mixed IBS.  4. Unsubtyped IBS.  It is important to know which type of IBS you have. Some treatments are more likely to be helpful for certain types of IBS.  CAUSES  The exact cause of IBS is not known. RISK FACTORS You may have a higher risk of IBS if:  You are a woman.  You are younger than 67 years old.  You have a family history of IBS.  You have mental health problems.  You have had bacterial infection of your GI tract. SIGNS AND SYMPTOMS  Symptoms of IBS vary from person to person. The main symptom is abdominal pain or discomfort. Additional symptoms usually include one or more of the following:   Diarrhea, constipation, or both.   Abdominal swelling or bloating.   Feeling full or sick after eating a small or regular-size meal.   Frequent gas.   Mucus in the stool.   A feeling of having more stool left after a bowel movement.  Symptoms tend to come and go. They may be associated with stress, psychiatric conditions, or nothing at all.  DIAGNOSIS  There is no specific test to diagnose IBS. Your health care provider will make a diagnosis based on a  physical exam, medical history, and your symptoms. You may have other tests to rule out other conditions that may be causing your symptoms. These may include:   Blood tests.   X-rays.   CT scan.  Endoscopy and colonoscopy. This is a test in which your GI tract is viewed with a long, thin, flexible tube. TREATMENT There is no cure for IBS, but treatment can help relieve symptoms. IBS treatment often includes:   Changes to your diet, such as:  Eating more fiber.  Avoiding foods that cause symptoms.  Drinking more water.  Eating regular, medium-sized portioned meals.  Medicines. These may include:  Fiber supplements if you have constipation.  Medicine to control diarrhea (antidiarrheal medicines).  Medicine to help control muscle spasms in your GI tract (antispasmodic medicines).  Medicines to help with any mental health issues, such as antidepressants or tranquilizers.  Therapy.  Talk therapy may help with anxiety, depression, or other mental health issues that can make IBS symptoms worse.  Stress reduction.  Managing your stress can help keep symptoms under control. HOME CARE INSTRUCTIONS   Take medicines only as directed by your health care provider.  Eat a healthy diet.  Avoid foods and drinks with added sugar.  Include more whole grains, fruits, and vegetables gradually into your diet. This may be especially helpful if you have IBS with constipation.  Avoid any foods and drinks that make your symptoms worse. These  may include dairy products and caffeinated or carbonated drinks.  Do not eat large meals.  Drink enough fluid to keep your urine clear or pale yellow.  Exercise regularly. Ask your health care provider for recommendations of good activities for you.  Keep all follow-up visits as directed by your health care provider. This is important. SEEK MEDICAL CARE IF:   You have constant pain.  You have trouble or pain with swallowing.  You have  worsening diarrhea. SEEK IMMEDIATE MEDICAL CARE IF:   You have severe and worsening abdominal pain.   You have diarrhea and:   You have a rash, stiff neck, or severe headache.   You are irritable, sleepy, or difficult to awaken.   You are weak, dizzy, or extremely thirsty.   You have bright red blood in your stool or you have black tarry stools.   You have unusual abdominal swelling that is painful.   You vomit continuously.   You vomit blood (hematemesis).   You have both abdominal pain and a fever.    This information is not intended to replace advice given to you by your health care provider. Make sure you discuss any questions you have with your health care provider.   Document Released: 06/30/2005 Document Revised: 07/21/2014 Document Reviewed: 03/17/2014 Elsevier Interactive Patient Education 2016 [place]. 10 x twice daily    Kegel Exercises The goal of Kegel exercises is to isolate and exercise your pelvic floor muscles. These muscles act as a hammock that supports the rectum, vagina, small intestine, and uterus. As the muscles weaken, the hammock sags and these organs are displaced from their normal positions. Kegel exercises can strengthen your pelvic floor muscles and help you to improve bladder and bowel control, improve sexual response, and help reduce many problems and some discomfort during pregnancy. Kegel exercises can be done anywhere and at any time. HOW TO PERFORM KEGEL EXERCISES 5. Locate your pelvic floor muscles. To do this, squeeze (contract) the muscles that you use when you try to stop the flow of urine. You will feel a tightness in the vaginal area (women) and a tight lift in the rectal area (men and women). 6. When you begin, contract your pelvic muscles tight for 2-5 seconds, then relax them for 2-5 seconds. This is one set. Do 4-5 sets with a short pause in between. 7. Contract your pelvic muscles for 8-10 seconds, then relax them for  8-10 seconds. Do 4-5 sets. If you cannot contract your pelvic muscles for 8-10 seconds, try 5-7 seconds and work your way up to 8-10 seconds. Your goal is 4-5 sets of 10 contractions each day. Keep your stomach, buttocks, and legs relaxed during the exercises. Perform sets of both short and long contractions. Vary your positions. Perform these contractions 3-4 times per day. Perform sets while you are:   Lying in bed in the morning.  Standing at lunch.  Sitting in the late afternoon.  Lying in bed at night. You should do 40-50 contractions per day. Do not perform more Kegel exercises per day than recommended. Overexercising can cause muscle fatigue. Continue these exercises for for at least 15-20 weeks or as directed by your caregiver.   This information is not intended to replace advice given to you by your health care provider. Make sure you discuss any questions you have with your health care provider.   Document Released: 06/16/2012 Document Revised: 07/21/2014 Document Reviewed: 06/16/2012 Elsevier Interactive Patient Education [employer].

## 2015-08-03 NOTE — Progress Notes (Signed)
Pre visit review using our clinic review tool, if applicable. No additional management support is needed unless otherwise documented below in the visit note. 

## 2015-08-03 NOTE — Progress Notes (Signed)
Subjective:    Patient ID: Crystal [name], female    DOB: [birth year], 65 y.o.   MRN: [id number]  Chief Complaint  Patient presents with  . Follow-up    HPI Patient is in today for follow up. No recent illness. Is struggling with some episodes os disequilibrium and lack of coordination. No other new complaints. No acute concerns. Denies CP/palp/SOB/HA/congestion/fevers/GI or GU c/o. Taking meds as prescribed  Past Medical History  Diagnosis Date  . Hypertension   . Hyperlipemia   . Dyspepsia   . OSA (obstructive sleep apnea)   . Plantar fasciitis   . Depression   . Asthma     02/08/10 FEV1 1.98 (80%), s/p saba 2.22 l/m (90%).nml  . Anxiety   . 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 Fatty liver 12/19/2010  . Internal hemorrhoids   . Diverticulosis   . Allergy     dust, cock roach,grass,weeds,molds  . GERD (gastroesophageal reflux disease)   . Acute peptic ulcer of stomach     As a teenager  . IBS (irritable bowel syndrome)   . Cataract   . High cholesterol     Past Surgical History  Procedure Laterality Date  . Knee arthroscopy Left 1998  . Toe surgery Right 1995  . Nasal septum surgery    . Rhinoplasty    . Appendectomy      age 56  . Colonoscopy  06/03/2010, 08/25/2011    diverticulosis   . Gastrectomy      partial - ulcer as a teen  . Hand surgery Right   . Cataract extraction, bilateral      12/8 and 12/14    Family History  Problem Relation Age of Onset  . Diabetes Mother     type II  . Heart attack Mother 54  . Ulcers      bleeding gastric  . Heart defect      child  . Other      perforated bowel, child  . Colon cancer Sister   . Cancer Sister     colon cancer  . Asthma Maternal Grandmother   . Mental retardation Son     PTSD  . Cancer Sister     stage 4 bladder cancer    Social History   Social History  . Marital Status: Married    Spouse Name: [name]  . Number of Children: 4  . Years of Education: N/A   Occupational History    . 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 Smoking status: Never Smoker   . Smokeless tobacco: Never Used  . Alcohol Use: 0.0 oz/week    0 Standard drinks or equivalent per week     Comment: Rarely  . Drug Use: No  . Sexual Activity: No     Comment: lives with her mother, no dietary restrictions   Other Topics Concern  . Not on file   Social History Narrative   No caffeine drinks daily     Outpatient Prescriptions Prior to Visit  Medication Sig Dispense Refill  . ACCU-CHEK FASTCLIX LANCETS MISC Check Blood Glucose bid  DX 250.81 102 each 1  . albuterol (PROAIR HFA) 108 (90 BASE) MCG/ACT inhaler Inhale 2 puffs into the lungs every 4 (four) hours as needed. Shortness of breath and wheezing 18 g 0  . alosetron (LOTRONEX) 1 MG tablet Take 1 tablet (1 mg total) by mouth 2 (two) times daily. And as directed 180 tablet  3  . aspirin 81 MG tablet Take 81 mg by mouth daily. Reported on 07/02/2015    . atorvastatin (LIPITOR) 80 MG tablet TAKE 1 TABLET (80 MG) BY MOUTH DAILY. 30 tablet 6  . beclomethasone (QVAR) 40 MCG/ACT inhaler Inhale 2 puffs into the lungs 2 (two) times daily. 1 Inhaler 12  . Blood Glucose Monitoring Suppl (ACCU-CHEK AVIVA PLUS) W/DEVICE KIT 1 kit by Does not apply route daily. DX 250.00 1 kit 0  . canagliflozin (INVOKANA) 100 MG TABS tablet TAKE 1 TABLET (100 MG TOTAL) BY MOUTH DAILY. 30 tablet 6  . clotrimazole-betamethasone (LOTRISONE) cream     . diazepam (VALIUM) 5 MG tablet Take 0.5 to 1 tablet by mouth twice daily as needed for anxiety 30 tablet 1  . dicyclomine (BENTYL) 20 MG tablet Take 1 tablet (20 mg total) by mouth every 6 (six) hours as needed (cramps/diarrhea). 40 tablet 5  . DUREZOL 0.05 % EMUL   0  . escitalopram (LEXAPRO) 10 MG tablet TAKE 1 TABLET (10 MG TOTAL) BY MOUTH DAILY. 90 tablet 1  . fenofibrate 160 MG tablet TAKE 1 TABLET (160 MG TOTAL) BY MOUTH AT BEDTIME. 90 tablet 2  . fluticasone (FLONASE) 50 MCG/ACT nasal spray Place 1 spray into  both nostrils daily. 16 g 0  . glucose blood (ACCU-CHEK AVIVA PLUS) test strip TEST BLOOD SUGARS 1 TO 2 TIMES A DAY. Dx 250.00 180 each 1  . HYDROcodone-acetaminophen (NORCO/VICODIN) 5-325 MG tablet Take 1-2 tablets every 6 hours as needed for severe pain 12 tablet 0  . methocarbamol (ROBAXIN) 500 MG tablet Take 500 mg by mouth every 6 (six) hours as needed for muscle spasms. Reported on 07/02/2015    . mometasone (NASONEX) 50 MCG/ACT nasal spray Place 2 sprays into the nose daily as needed. 17 g 6  . Multiple Vitamins-Minerals (CENTRUM SILVER PO) Take 1 tablet by mouth daily.      . mupirocin ointment (BACTROBAN) 2 % Place 1 application into the nose at bedtime as needed. 22 g 0  . nitroGLYCERIN (NITRODUR - DOSED IN MG/24 HR) 0.2 mg/hr patch Reported on 07/02/2015    . ondansetron (ZOFRAN) 4 MG tablet Take 1 tablet (4 mg total) by mouth every 8 (eight) hours as needed for nausea. 60 tablet 1  . oxyCODONE-acetaminophen (PERCOCET/ROXICET) 5-325 MG per tablet Take 1-2 tablets by mouth every 4 (four) hours as needed for severe pain. Reported on 07/02/2015    . pantoprazole (PROTONIX) 40 MG tablet TAKE 1 TABLET (40 MG TOTAL) BY MOUTH DAILY. 90 tablet 1  . Probiotic Product (ALIGN) 4 MG CAPS Take 1 capsule by mouth daily. Reported on 07/02/2015    . PROLENSA 0.07 % SOLN   0  . sertraline (ZOLOFT) 50 MG tablet TAKE 1 TABLET (50 MG TOTAL) BY MOUTH DAILY. 90 tablet 1  . valACYclovir (VALTREX) 1000 MG tablet TAKE 1 TABLET (1,000 MG) BY MOUTH 2 TIMES A DAY 60 tablet 0  . glimepiride (AMARYL) 2 MG tablet TAKE 1 TABLET BY MOUTH DAILY WITH BREAKFAST 30 tablet 3  . lisinopril (PRINIVIL,ZESTRIL) 2.5 MG tablet TAKE 1 TABLET (2.5 MG TOTAL) BY MOUTH DAILY. 90 tablet 1   No facility-administered medications prior to visit.    Allergies  Allergen Reactions  . Compazine [Prochlorperazine Edisylate]   . Prochlorperazine Edisylate Other (See Comments)    coma for 3 days  . Tetanus Toxoid Other (See Comments)     convulsions    ROS     Objective:  Physical Exam  Constitutional: She is oriented to person, place, and time. She appears well-developed and well-nourished.  HENT:  Head: Normocephalic and atraumatic.  Nose: Nose normal.  Mouth/Throat: Oropharynx is clear and moist.  Eyes: Conjunctivae and EOM are normal. Pupils are equal, round, and reactive to light. Right eye exhibits no discharge. Left eye exhibits no discharge. No scleral icterus.  Neck: Normal range of motion. Neck supple. No thyromegaly present.  Cardiovascular: Normal rate, regular rhythm, normal heart sounds and intact distal pulses.   No murmur heard. Pulmonary/Chest: Effort normal and breath sounds normal. No respiratory distress. She has no wheezes.  Abdominal: Soft. Bowel sounds are normal. She exhibits no distension. There is no tenderness. There is no rebound.  Musculoskeletal: Normal range of motion. She exhibits no edema or tenderness.  Lymphadenopathy:    She has no cervical adenopathy.  Neurological: She is alert and oriented to person, place, and time. She displays normal reflexes. Coordination normal.  Skin: Skin is warm and dry. No rash noted. No erythema. No pallor.  Psychiatric: She has a normal mood and affect. Thought content normal.    BP 110/76 mmHg  Pulse 81  Temp(Src) 97.7 F (36.5 C) (Oral)  Ht '5\' 3"'  (1.6 m)  Wt 147 lb 4 oz (66.792 kg)  BMI 26.09 kg/m2  SpO2 93% Wt Readings from Last 3 Encounters:  08/11/15 146 lb (66.225 kg)  08/03/15 147 lb 4 oz (66.792 kg)  07/13/15 147 lb (66.679 kg)     Lab Results  Component Value Date   WBC 6.9 04/23/2015   HGB 14.4 04/23/2015   HCT 42.7 04/23/2015   PLT 237.0 04/23/2015   GLUCOSE 144* 04/23/2015   CHOL 145 04/23/2015   TRIG 147.0 04/23/2015   HDL 45.30 04/23/2015   LDLDIRECT 123.0 09/18/2014   LDLCALC 70 04/23/2015   ALT 21 04/23/2015   AST 16 04/23/2015   NA 141 04/23/2015   K 4.2 04/23/2015   CL 107 04/23/2015   CREATININE 0.67  04/23/2015   BUN 18 04/23/2015   CO2 27 04/23/2015   TSH 3.75 04/23/2015   HGBA1C 7.2* 08/03/2015   MICROALBUR 1.3 04/30/2015    Lab Results  Component Value Date   TSH 3.75 04/23/2015   Lab Results  Component Value Date   WBC 6.9 04/23/2015   HGB 14.4 04/23/2015   HCT 42.7 04/23/2015   MCV 92.0 04/23/2015   PLT 237.0 04/23/2015   Lab Results  Component Value Date   NA 141 04/23/2015   K 4.2 04/23/2015   CO2 27 04/23/2015   GLUCOSE 144* 04/23/2015   BUN 18 04/23/2015   CREATININE 0.67 04/23/2015   BILITOT 0.5 04/23/2015   ALKPHOS 70 04/23/2015   AST 16 04/23/2015   ALT 21 04/23/2015   PROT 7.2 04/23/2015   ALBUMIN 4.1 04/23/2015   CALCIUM 9.2 04/23/2015   ANIONGAP 7 01/08/2015   GFR 93.60 04/23/2015   Lab Results  Component Value Date   CHOL 145 04/23/2015   Lab Results  Component Value Date   HDL 45.30 04/23/2015   Lab Results  Component Value Date   LDLCALC 70 04/23/2015   Lab Results  Component Value Date   TRIG 147.0 04/23/2015   Lab Results  Component Value Date   CHOLHDL 3 04/23/2015   Lab Results  Component Value Date   HGBA1C 7.2* 08/03/2015       Assessment & Plan:   Problem List Items Addressed This Visit    DM (diabetes  mellitus), type 2 with neurological complications (HCC)    [id number] acceptable, minimize simple carbs. Increase exercise as tolerated. Continue current meds      Relevant Medications   losartan (COZAAR) 25 MG tablet   Other Relevant Orders   Hemoglobin A1C (Completed)   Ambulatory referral to Podiatry   Hemoglobin A1c   Urinalysis (Completed)   Essential hypertension    Well controlled, no changes to meds. Encouraged heart healthy diet such as the DASH diet and exercise as tolerated.       Relevant Medications   losartan (COZAAR) 25 MG tablet   Other Relevant Orders   TSH   Comprehensive metabolic panel   CBC with Differential/Platelet   Urinalysis (Completed)   Genital herpes   Relevant Orders    Urinalysis (Completed)   HSV(herpes smplx)abs-1+2(IgG+IgM)-bld (Completed)   Hyperlipidemia - Primary    Encouraged heart healthy diet, increase exercise, avoid trans fats, consider a krill oil cap daily      Relevant Medications   losartan (COZAAR) 25 MG tablet   Other Relevant Orders   Lipid panel   Urinalysis (Completed)   Irritable bowel syndrome - diarrhea predominant    Avoid offending foods, start probiotics. Do not eat large meals in late evening and consider raising head of bed. Add a fiber supplement      Osteopenia   Relevant Orders   VITAMIN D 25 Hydroxy (Vit-D Deficiency, Fractures) (Completed)   VITAMIN D 25 Hydroxy (Vit-D Deficiency, Fractures)   Urinalysis (Completed)    Other Visit Diagnoses    Foot callus        Relevant Orders    Ambulatory referral to Podiatry    Urinalysis (Completed)    Need for 23-polyvalent pneumococcal polysaccharide vaccine        Relevant Orders    Pneumococcal polysaccharide vaccine 23-valent greater than or equal to 2yo subcutaneous/IM (Completed)    Urinalysis (Completed)    Urinary incontinence, unspecified incontinence type        Relevant Orders    Urinalysis (Completed)       I have discontinued Ms. Kloeppel's lisinopril. I am also having her start on losartan. Additionally, I am having her maintain her Multiple Vitamins-Minerals (CENTRUM SILVER PO), ALIGN, ondansetron, ACCU-CHEK AVIVA PLUS, aspirin, nitroGLYCERIN, clotrimazole-betamethasone, ACCU-CHEK FASTCLIX LANCETS, albuterol, fluticasone, mometasone, glucose blood, alosetron, dicyclomine, escitalopram, beclomethasone, mupirocin ointment, atorvastatin, fenofibrate, pantoprazole, oxyCODONE-acetaminophen, methocarbamol, sertraline, diazepam, HYDROcodone-acetaminophen, PROLENSA, DUREZOL, valACYclovir, and canagliflozin.  Meds ordered this encounter  Medications  . losartan (COZAAR) 25 MG tablet    Sig: Take 1 tablet (25 mg total) by mouth daily.    Dispense:  30 tablet     Refill:  2     Willette Alma, MD

## 2015-08-04 LAB — HEMOGLOBIN A1C
HEMOGLOBIN A1C: 7.2 % — AB (ref <5.7)
Mean Plasma Glucose: 160 mg/dL — ABNORMAL HIGH (ref <117)

## 2015-08-04 LAB — URINALYSIS
Bilirubin Urine: NEGATIVE
HGB URINE DIPSTICK: NEGATIVE
Ketones, ur: NEGATIVE
LEUKOCYTES UA: NEGATIVE
Nitrite: NEGATIVE
PH: 5 (ref 5.0–8.0)
Protein, ur: NEGATIVE
Specific Gravity, Urine: 1.029 (ref 1.001–1.035)

## 2015-08-04 LAB — VITAMIN D 25 HYDROXY (VIT D DEFICIENCY, FRACTURES): Vit D, 25-Hydroxy: 27 ng/mL — ABNORMAL LOW (ref 30–100)

## 2015-08-06 ENCOUNTER — Telehealth: Payer: Self-pay | Admitting: Family Medicine

## 2015-08-06 NOTE — Telephone Encounter (Signed)
The patient was seen on Friday 08/03/2015.  She told PCP she thought lisinopril was a problem for her to take, but called back to state it was the Invokana she is wanting to change.  Advise please.

## 2015-08-06 NOTE — Telephone Encounter (Signed)
We switched her to Losartan from Lisinopril and as long as she tolerates and they do not charge her too much we can continue with that. She can stop the Invokana and increase glimeperide to bid, disp QS with 2 rf

## 2015-08-07 LAB — HSV(HERPES SMPLX)ABS-I+II(IGG+IGM)-BLD
HSV 1 Glycoprotein G Ab, IgG: 4.37 IV — ABNORMAL HIGH
HSV 2 Glycoprotein G Ab, IgG: 6.13 IV — ABNORMAL HIGH
Herpes Simplex Vrs I&II-IgM Ab (EIA): 0.39 INDEX

## 2015-08-07 MED ORDER — GLIMEPIRIDE 2 MG PO TABS: 2.0000 mg | ORAL_TABLET | Freq: Two times a day (BID) | ORAL | Status: DC

## 2015-08-07 MED FILL — GLIMEPIRIDE 2 MG TABLET: 2 | 30 days supply | Qty: 60 | Fill #0

## 2015-08-07 NOTE — Telephone Encounter (Signed)
Updated medication list and sent in to [facility].  Patient informed

## 2015-08-11 ENCOUNTER — Emergency Department (HOSPITAL_BASED_OUTPATIENT_CLINIC_OR_DEPARTMENT_OTHER)
Admission: EM | Admit: 2015-08-11 | Discharge: 2015-08-11 | Disposition: A | Discharge status: Home / Self Care | Payer: PPO | Attending: Emergency Medicine | Admitting: Emergency Medicine

## 2015-08-11 ENCOUNTER — Encounter (HOSPITAL_BASED_OUTPATIENT_CLINIC_OR_DEPARTMENT_OTHER): Payer: Self-pay | Admitting: *Deleted

## 2015-08-11 ENCOUNTER — Emergency Department (HOSPITAL_BASED_OUTPATIENT_CLINIC_OR_DEPARTMENT_OTHER): Payer: PPO

## 2015-08-11 DIAGNOSIS — Y9289 Other specified places as the place of occurrence of the external cause: Secondary | ICD-10-CM | POA: Diagnosis not present

## 2015-08-11 DIAGNOSIS — I10 Essential (primary) hypertension: Secondary | ICD-10-CM | POA: Insufficient documentation

## 2015-08-11 DIAGNOSIS — S0081XA Abrasion of other part of head, initial encounter: Secondary | ICD-10-CM | POA: Insufficient documentation

## 2015-08-11 DIAGNOSIS — Z8669 Personal history of other diseases of the nervous system and sense organs: Secondary | ICD-10-CM | POA: Diagnosis not present

## 2015-08-11 DIAGNOSIS — S80211A Abrasion, right knee, initial encounter: Secondary | ICD-10-CM | POA: Insufficient documentation

## 2015-08-11 DIAGNOSIS — E119 Type 2 diabetes mellitus without complications: Secondary | ICD-10-CM | POA: Diagnosis not present

## 2015-08-11 DIAGNOSIS — E7800 Pure hypercholesterolemia, unspecified: Secondary | ICD-10-CM | POA: Insufficient documentation

## 2015-08-11 DIAGNOSIS — Z79899 Other long term (current) drug therapy: Secondary | ICD-10-CM | POA: Diagnosis not present

## 2015-08-11 DIAGNOSIS — K219 Gastro-esophageal reflux disease without esophagitis: Secondary | ICD-10-CM | POA: Insufficient documentation

## 2015-08-11 DIAGNOSIS — Z7951 Long term (current) use of inhaled steroids: Secondary | ICD-10-CM | POA: Insufficient documentation

## 2015-08-11 DIAGNOSIS — S6991XA Unspecified injury of right wrist, hand and finger(s), initial encounter: Secondary | ICD-10-CM | POA: Insufficient documentation

## 2015-08-11 DIAGNOSIS — Z7982 Long term (current) use of aspirin: Secondary | ICD-10-CM | POA: Diagnosis not present

## 2015-08-11 DIAGNOSIS — F419 Anxiety disorder, unspecified: Secondary | ICD-10-CM | POA: Diagnosis not present

## 2015-08-11 DIAGNOSIS — J45909 Unspecified asthma, uncomplicated: Secondary | ICD-10-CM | POA: Diagnosis not present

## 2015-08-11 DIAGNOSIS — R52 Pain, unspecified: Secondary | ICD-10-CM

## 2015-08-11 DIAGNOSIS — Y998 Other external cause status: Secondary | ICD-10-CM | POA: Insufficient documentation

## 2015-08-11 DIAGNOSIS — K589 Irritable bowel syndrome without diarrhea: Secondary | ICD-10-CM | POA: Diagnosis not present

## 2015-08-11 DIAGNOSIS — W19XXXA Unspecified fall, initial encounter: Secondary | ICD-10-CM

## 2015-08-11 DIAGNOSIS — W01198A Fall on same level from slipping, tripping and stumbling with subsequent striking against other object, initial encounter: Secondary | ICD-10-CM | POA: Diagnosis not present

## 2015-08-11 DIAGNOSIS — Y9389 Activity, other specified: Secondary | ICD-10-CM | POA: Insufficient documentation

## 2015-08-11 DIAGNOSIS — M25569 Pain in unspecified knee: Secondary | ICD-10-CM

## 2015-08-11 DIAGNOSIS — S80212A Abrasion, left knee, initial encounter: Secondary | ICD-10-CM | POA: Insufficient documentation

## 2015-08-11 DIAGNOSIS — S0990XA Unspecified injury of head, initial encounter: Secondary | ICD-10-CM

## 2015-08-11 DIAGNOSIS — F329 Major depressive disorder, single episode, unspecified: Secondary | ICD-10-CM | POA: Diagnosis not present

## 2015-08-11 DIAGNOSIS — S79912A Unspecified injury of left hip, initial encounter: Secondary | ICD-10-CM | POA: Insufficient documentation

## 2015-08-11 DIAGNOSIS — Z8739 Personal history of other diseases of the musculoskeletal system and connective tissue: Secondary | ICD-10-CM | POA: Insufficient documentation

## 2015-08-11 DIAGNOSIS — T07XXXA Unspecified multiple injuries, initial encounter: Secondary | ICD-10-CM

## 2015-08-11 DIAGNOSIS — E785 Hyperlipidemia, unspecified: Secondary | ICD-10-CM | POA: Insufficient documentation

## 2015-08-11 DIAGNOSIS — S60512A Abrasion of left hand, initial encounter: Secondary | ICD-10-CM | POA: Insufficient documentation

## 2015-08-11 LAB — CBG MONITORING, ED: GLUCOSE-CAPILLARY: 156 mg/dL — AB (ref 65–99)

## 2015-08-11 MED ORDER — OXYCODONE-ACETAMINOPHEN 5-325 MG PO TABS
1.0000 | ORAL_TABLET | Freq: Once | ORAL | Status: AC
  Administered 2015-08-11: 1 via ORAL
  Filled 2015-08-11: qty 1

## 2015-08-11 NOTE — ED Notes (Signed)
Patient is alert and oriented x4.  She is complaining of a fall with multiple abrasions.  Patient  States that she fell between two cars while stepping off a curb.  Patient denies any LOC.  Patient denies taking blood thinners.  Currently patient states that she rates her pain 9 of 10

## 2015-08-11 NOTE — Discharge Instructions (Signed)
Return to the ED with any concerns including vomiting , seizure activity, increased pain, swelling/discoloration/numbness of fingers/toes, decreased level of alertness/lethargy, or any other alarming symptoms

## 2015-08-11 NOTE — ED Notes (Signed)
Patient transported to X-ray 

## 2015-08-11 NOTE — ED Provider Notes (Signed)
CSN: [id number]     Arrival date & time 08/11/15  1922 History  By signing my name below, I, Altamease Oiler, attest that this documentation has been prepared under the direction and in the presence of Alfonzo Beers, MD. Electronically Signed: Altamease Oiler, ED Scribe. 08/11/2015. 8:24 PM   Chief Complaint  Patient presents with  . Fall   The history is provided by the patient. No language interpreter was used.   Crystal [name] is a 65 y.o. female who presents to the Emergency Department complaining of a fall tonight. Pt states that she "slid off the curb" and fell forward striking her face on the concrete.  Associated symptoms include right wrist pain, left hip pain, and abrasions at the face, bilateral knees, and left hand. Pt denies LOC, neck pain, back pain. Dizziness, seizures. She is not on blood thinners. Her blood sugar gas been well controlled but she did not measure it today. Pt states that she is allergic to tetanus.  No vomiting or seizure activity.  There are no other associated systemic symptoms, there are no other alleviating or modifying factors.   Past Medical History  Diagnosis Date  . Hypertension   . Hyperlipemia   . Dyspepsia   . OSA (obstructive sleep apnea)   . Plantar fasciitis   . Depression   . Asthma     02/08/10 FEV1 1.98 (80%), s/p saba 2.22 l/m (90%).nml  . Anxiety   . 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 Fatty liver 12/19/2010  . Internal hemorrhoids   . Diverticulosis   . Allergy     dust, cock roach,grass,weeds,molds  . GERD (gastroesophageal reflux disease)   . Acute peptic ulcer of stomach     As a teenager  . IBS (irritable bowel syndrome)   . Cataract   . High cholesterol    Past Surgical History  Procedure Laterality Date  . Knee arthroscopy Left 1998  . Toe surgery Right 1995  . Nasal septum surgery    . Rhinoplasty    . Appendectomy      age 8  . Colonoscopy  06/03/2010, 08/25/2011    diverticulosis   . Gastrectomy       partial - ulcer as a teen  . Hand surgery Right   . Cataract extraction, bilateral      12/8 and 12/14   Family History  Problem Relation Age of Onset  . Diabetes Mother     type II  . Heart attack Mother 57  . Ulcers      bleeding gastric  . Heart defect      child  . Other      perforated bowel, child  . Colon cancer Sister   . Cancer Sister     colon cancer  . Asthma Maternal Grandmother   . Mental retardation Son     PTSD  . Cancer Sister     stage 4 bladder cancer   Social History  Substance Use Topics  . Smoking status: Never Smoker   . Smokeless tobacco: Never Used  . Alcohol Use: 0.0 oz/week    0 Standard drinks or equivalent per week     Comment: Rarely   OB History    Gravida Para Term Preterm AB TAB SAB Ectopic Multiple Living   4 4              Obstetric Comments   1 child died at 6 weeks (heart defects, perforated bowel)  Review of Systems  Respiratory: Negative for shortness of breath.   Cardiovascular: Negative for chest pain.  Gastrointestinal: Negative for abdominal pain.  Musculoskeletal: Positive for arthralgias.       Right wrist pain Left hip pain  Skin:       Abrasions at the face, bilateral knees, and left hand  Neurological: Negative for headaches.  All other systems reviewed and are negative.   Allergies  Compazine; Prochlorperazine edisylate; and Tetanus toxoid  Home Medications   Prior to Admission medications   Medication Sig Start Date End Date Taking? Authorizing Provider  ACCU-CHEK FASTCLIX LANCETS MISC Check Blood Glucose bid  DX 250.81 02/03/14   [name], MD  albuterol (PROAIR HFA) 108 (90 BASE) MCG/ACT inhaler Inhale 2 puffs into the lungs every 4 (four) hours as needed. Shortness of breath and wheezing 02/03/14   [name], MD  alosetron (LOTRONEX) 1 MG tablet Take 1 tablet (1 mg total) by mouth 2 (two) times daily. And as directed 04/18/14   Gatha Mayer, MD  aspirin 81 MG tablet Take 81 mg by mouth  daily. Reported on 07/02/2015    Historical Provider, MD  atorvastatin (LIPITOR) 80 MG tablet TAKE 1 TABLET (80 MG) BY MOUTH DAILY. 10/17/14   [name], MD  beclomethasone (QVAR) 40 MCG/ACT inhaler Inhale 2 puffs into the lungs 2 (two) times daily. 08/03/14   Percell Miller Saguier, PA-C  Blood Glucose Monitoring Suppl (ACCU-CHEK AVIVA PLUS) W/DEVICE KIT 1 kit by Does not apply route daily. DX 250.00 08/06/12   Debbrah Alar, NP  canagliflozin (INVOKANA) 100 MG TABS tablet TAKE 1 TABLET (100 MG TOTAL) BY MOUTH DAILY. 07/11/15   [name], MD  clotrimazole-betamethasone (LOTRISONE) cream  09/01/13   Historical Provider, MD  diazepam (VALIUM) 5 MG tablet Take 0.5 to 1 tablet by mouth twice daily as needed for anxiety 04/12/15   [name], MD  dicyclomine (BENTYL) 20 MG tablet Take 1 tablet (20 mg total) by mouth every 6 (six) hours as needed (cramps/diarrhea). 04/25/14   Gatha Mayer, MD  DUREZOL 0.05 % EMUL  05/30/15   Historical Provider, MD  escitalopram (LEXAPRO) 10 MG tablet TAKE 1 TABLET (10 MG TOTAL) BY MOUTH DAILY. 05/08/14   [name], MD  fenofibrate 160 MG tablet TAKE 1 TABLET (160 MG TOTAL) BY MOUTH AT BEDTIME. 11/07/14   [name], MD  fluticasone (FLONASE) 50 MCG/ACT nasal spray Place 1 spray into both nostrils daily. 02/03/14   [name], MD  glimepiride (AMARYL) 2 MG tablet Take 1 tablet (2 mg total) by mouth 2 (two) times daily. 08/07/15   [name], MD  glucose blood (ACCU-CHEK AVIVA PLUS) test strip TEST BLOOD SUGARS 1 TO 2 TIMES A DAY. Dx 250.00 02/03/14   [name], MD  HYDROcodone-acetaminophen (NORCO/VICODIN) 5-325 MG tablet Take 1-2 tablets every 6 hours as needed for severe pain 06/09/15   Carlisle Cater, PA-C  losartan (COZAAR) 25 MG tablet Take 1 tablet (25 mg total) by mouth daily. 08/03/15   [name], MD  methocarbamol (ROBAXIN) 500 MG tablet Take 500 mg by mouth every 6 (six) hours as needed for muscle spasms. Reported on 07/02/2015     Historical Provider, MD  mometasone (NASONEX) 50 MCG/ACT nasal spray Place 2 sprays into the nose daily as needed. 02/03/14   [name], MD  Multiple Vitamins-Minerals (CENTRUM SILVER PO) Take 1 tablet by mouth daily.  Historical Provider, MD  mupirocin ointment (BACTROBAN) 2 % Place 1 application into the nose at bedtime as needed. 09/18/14   [name], MD  nitroGLYCERIN (NITRODUR - DOSED IN MG/24 HR) 0.2 mg/hr patch Reported on 07/02/2015 10/31/13   Historical Provider, MD  ondansetron (ZOFRAN) 4 MG tablet Take 1 tablet (4 mg total) by mouth every 8 (eight) hours as needed for nausea. 06/24/12   Gatha Mayer, MD  oxyCODONE-acetaminophen (PERCOCET/ROXICET) 5-325 MG per tablet Take 1-2 tablets by mouth every 4 (four) hours as needed for severe pain. Reported on 07/02/2015    Historical Provider, MD  pantoprazole (PROTONIX) 40 MG tablet TAKE 1 TABLET (40 MG TOTAL) BY MOUTH DAILY. 12/07/14   [name], MD  Probiotic Product (ALIGN) 4 MG CAPS Take 1 capsule by mouth daily. Reported on 07/02/2015    Historical Provider, MD  PROLENSA 0.07 % SOLN  06/04/15   Historical Provider, MD  sertraline (ZOLOFT) 50 MG tablet TAKE 1 TABLET (50 MG TOTAL) BY MOUTH DAILY. 02/06/15   [name], MD  valACYclovir (VALTREX) 1000 MG tablet TAKE 1 TABLET (1,000 MG) BY MOUTH 2 TIMES A DAY 07/11/15   [name], MD   BP 171/100 mmHg  Pulse 75  Temp(Src) 97.9 F (36.6 C) (Oral)  Resp 20  Ht '5\' 3"'  (1.6 m)  Wt 146 lb (66.225 kg)  BMI 25.87 kg/m2  SpO2 100% Vitals reviewed Physical Exam  Physical Examination: General appearance - alert, well appearing, and in no distress Mental status - alert, oriented to person, place, and time Eyes - no conjunctival injection no scleral icterus Head- abrasion overlying left forehead Mouth - mucous membranes moist, pharynx normal without lesions Neck - no midline tenderness to palpation, FROM without pain Chest - clear to auscultation, no wheezes, rales or  rhonchi, symmetric air entry Heart - normal rate, regular rhythm, normal S1, S2, no murmurs, rubs, clicks or gallops Abdomen - soft, nontender, nondistended, no masses or organomegaly Back exam - no midline tenderness to palpation, no CVA tenderness Neurological - alert, oriented, normal speech Musculoskeletal - no joint tenderness, deformity or swelling other than ttp over bilateral patella, abrasions overlyng patella, ttp over right first metacarpal Extremities - peripheral pulses normal, no pedal edema, no clubbing or cyanosis Skin - normal coloration and turgor, no rashes, abrasions of left dorsum of hand overlying 5th metacarpal, bilateral patella abrasions, forehead abrasion  ED Course  Procedures (including critical care time) DIAGNOSTIC STUDIES: Oxygen Saturation is 100% on RA,  normal by my interpretation.    COORDINATION OF CARE: 8:21 PM Discussed treatment plan which includes lab work,  XR of the hands bilaterally, XR of the knees bilaterally, and Percocet with pt at bedside and pt agreed to plan.  Labs Review Labs Reviewed  CBG MONITORING, ED - Abnormal; Notable for the following:    Glucose-Capillary 156 (*)    All other components within normal limits    Imaging Review Dg Knee Complete 4 Views Left  08/11/2015  CLINICAL DATA:  Fall off curb today. Left knee injury and pain. Initial encounter. EXAM: LEFT KNEE - COMPLETE 4+ VIEW COMPARISON:  None. FINDINGS: There is no evidence of fracture, dislocation, or joint effusion. There is no evidence of arthropathy or other focal bone abnormality. Mild infrapatellar soft tissue swelling noted. IMPRESSION: Mild infrapatellar soft tissue swelling. No evidence of fracture or dislocation. Electronically Signed   By: Earle Gell M.D.   On: 08/11/2015 21:23   Dg Knee Complete  4 Views Right  08/11/2015  CLINICAL DATA:  Fall with multiple abrasions.  Bilateral knee pain. EXAM: RIGHT KNEE - COMPLETE 4+ VIEW COMPARISON:  None. FINDINGS: There  is no evidence of fracture, dislocation, or joint effusion. There is no evidence of arthropathy or other focal bone abnormality. Soft tissues are unremarkable. IMPRESSION: Negative. Electronically Signed   By: Lucienne Capers M.D.   On: 08/11/2015 21:23   Dg Hand Complete Left  08/11/2015  CLINICAL DATA:  Status post fall, with multiple abrasions. Incidentally EXAM: LEFT HAND - COMPLETE 3+ VIEW COMPARISON:  Left thumb radiographs performed 11/15/2012 FINDINGS: There is no evidence of fracture or dislocation. The joint spaces are preserved. The carpal rows are intact, and demonstrate normal alignment. The soft tissues are unremarkable in appearance. IMPRESSION: No evidence of fracture or dislocation. Electronically Signed   By: Garald Balding M.D.   On: 08/11/2015 21:25   Dg Hand Complete Right  08/11/2015  CLINICAL DATA:  Fall with multiple abrasions. No loss of consciousness. Bilateral hand pain. EXAM: RIGHT HAND - COMPLETE 3+ VIEW COMPARISON:  Right first finger 09/28/2014 FINDINGS: Postoperative resection of the trapezium. Defect in the proximal aspect first and second metatarsals likely postoperative. Mild degenerative changes in the interphalangeal joints of the right hand. No evidence of acute fracture or subluxation. No focal bone lesion or bone destruction. Bone cortex and trabecular architecture appear intact. No radiopaque soft tissue foreign bodies. IMPRESSION: Postoperative and degenerative changes in the right hand as discussed. No acute bony abnormalities. Electronically Signed   By: Lucienne Capers M.D.   On: 08/11/2015 21:18   I have personally reviewed and evaluated these images and lab results as part of my medical decision-making.   EKG Interpretation None      MDM   Final diagnoses:  Pain  Fall, initial encounter  Minor head injury, initial encounter  Abrasions of multiple sites  Knee pain, unspecified laterality    Pt presenting with c/o fall, abrasions to forehead,  left hand, bilateral knees.  Xrays reassuring.  No LOC, vomiting or seizure activity, GCS 15- no imaging of head indicated, pt not on blood thinners.   Discharged with strict return precautions.  Pt agreeable with plan.  I personally performed the services described in this documentation, which was scribed in my presence. The recorded information has been reviewed and is accurate.     Alfonzo Beers, MD 08/11/15 2231

## 2015-08-11 NOTE — ED Notes (Signed)
CBG 156

## 2015-08-12 ENCOUNTER — Encounter: Payer: Self-pay | Admitting: Family Medicine

## 2015-08-12 NOTE — Assessment & Plan Note (Signed)
Well controlled, no changes to meds. Encouraged heart healthy diet such as the DASH diet and exercise as tolerated.  °

## 2015-08-12 NOTE — Assessment & Plan Note (Signed)
Encouraged heart healthy diet, increase exercise, avoid trans fats, consider a krill oil cap daily 

## 2015-08-12 NOTE — Assessment & Plan Note (Signed)
Avoid offending foods, start probiotics. Do not eat large meals in late evening and consider raising head of bed. Add a fiber supplement.  

## 2015-08-12 NOTE — Assessment & Plan Note (Signed)
hgba1c acceptable, minimize simple carbs. Increase exercise as tolerated. Continue current meds 

## 2015-08-15 ENCOUNTER — Encounter: Payer: Self-pay | Admitting: Podiatry

## 2015-08-15 ENCOUNTER — Ambulatory Visit (INDEPENDENT_AMBULATORY_CARE_PROVIDER_SITE_OTHER): Payer: PPO | Admitting: Podiatry

## 2015-08-15 VITALS — BP 150/96 | HR 76 | Ht 63.0 in | Wt 146.0 lb

## 2015-08-15 DIAGNOSIS — M21961 Unspecified acquired deformity of right lower leg: Secondary | ICD-10-CM | POA: Insufficient documentation

## 2015-08-15 DIAGNOSIS — Q828 Other specified congenital malformations of skin: Secondary | ICD-10-CM | POA: Insufficient documentation

## 2015-08-15 DIAGNOSIS — M7741 Metatarsalgia, right foot: Secondary | ICD-10-CM | POA: Diagnosis not present

## 2015-08-15 NOTE — Patient Instructions (Signed)
Seen for painful callus and pain under ball of right foot. Callus debrided and pad added to existing orthotic under 2nd MPJ right. Return as needed.

## 2015-08-15 NOTE — Progress Notes (Signed)
SUBJECTIVE: 65 y.o. year old female presents stating right foot has painful callus and under the ball of 2nd toe hurts. She is wearing custom made orthotics. Stated that they were not helping.  Her blood sugar runs in between 115-90. Been diabetic since 2003.   REVIEW OF SYSTEMS: Constitutional: negative Eyes: negative Ears, nose, mouth, throat, and face: negative Respiratory: negative Cardiovascular: Hypertension.  Gastrointestinal: negative Genitourinary:negative Integument/breast: negative Neurological: negative Allergic/Immunologic: negative  OBJECTIVE: DERMATOLOGIC EXAMINATION: Nails: normal appearing nails bilaterally Positive for plantar porokeratotic lesion under 5th MPJ area right, symptomatic.  VASCULAR EXAMINATION OF LOWER LIMBS: Pedal pulses: All pedal pulses are palpable with normal pulsation.  Capillary Filling times within 3 seconds in all digits.  No edema, erythema or ischemic changes noted. Temperature gradient from tibial crest to dorsum of foot is within normal bilateral.  NEUROLOGIC EXAMINATION OF THE LOWER LIMBS: All epicritic and tactile sensations grossly intact bilateral.  MUSCULOSKELETAL EXAMINATION: Positive for old incision line over the right bunion area. Recurrent bunion with elevated first metatarsal right. Long 2nd digit with prominent 2nd metatarsal head plantary.    ASSESSMENT: 1. 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 2. Long and plantar flexing 2nd metatarsal with pain under 2nd MPJ right foot. 3. Short and elevated first ray right with recurrent dorsal bunion, post bunion surgery.  PLAN: Reviewed clinical findings and available treatment options, periodic debridement, orthotic adjustment, possible realignment of the 2nd metatarsal right foot. Painful lesion debrided on plantar sub 5 right. 1/4" aperture pad added to existing orthotic right foot.  Patient will return as needed.

## 2015-08-16 ENCOUNTER — Other Ambulatory Visit: Payer: Self-pay | Admitting: Family Medicine

## 2015-08-16 MED FILL — FENOFIBRATE 160 MG TABLET: 160 | 30 days supply | Qty: 30 | Fill #5

## 2015-08-16 MED FILL — PANTOPRAZOLE SOD DR 40 MG T: 40 | 90 days supply | Qty: 90 | Fill #0

## 2015-08-16 MED FILL — ATORVASTATIN 80 MG TABLET: 80 | 30 days supply | Qty: 30 | Fill #0

## 2015-08-27 ENCOUNTER — Encounter: Payer: Self-pay | Admitting: Internal Medicine

## 2015-08-27 ENCOUNTER — Ambulatory Visit (INDEPENDENT_AMBULATORY_CARE_PROVIDER_SITE_OTHER): Payer: PPO | Admitting: Internal Medicine

## 2015-08-27 VITALS — BP 142/84 | HR 92 | Ht 63.0 in | Wt 149.0 lb

## 2015-08-27 DIAGNOSIS — K589 Irritable bowel syndrome without diarrhea: Secondary | ICD-10-CM | POA: Diagnosis not present

## 2015-08-27 NOTE — Progress Notes (Signed)
   Subjective:    Patient ID: Crystal [name], female    DOB: [birth year], 65 y.o.   MRN: [id number] Cc: constipation HPI For past 1.5 months she has not been able to produce an effective bowel movement with consistency Small balls or large formed stools No pain No bleeding Has been off alosetron for some time now and was not having diarrhea Medications, allergies, past medical history, past surgical history, family history and social history are reviewed and updated in the EMR.  Review of Systems No urinary sxs    Objective:   Physical Exam BP 142/84 mmHg  Pulse 92  Ht 5\' 3"  (1.6 m)  Wt 149 lb (67.586 kg)  BMI 26.40 kg/m2 Abdomen soft and nontender Rectal - female staff present 2 small fleshy anal tags Sl decreased resting tone and voluntary squeeze No mass - formed brown stool Appropriate voluntary squeeze with appropriate abdominal contraction and descent a;lso   2013 colonoscopy - diverticulosis    Assessment & Plan:  IBS (irritable bowel syndrome)   Now constipated - off alosetron for some time Will try benefiber 2 tbsp/day If that does not help likely MiraLax  15 minutes time spent with patient > half in counseling coordination of care

## 2015-08-27 NOTE — Patient Instructions (Signed)
  Today you have been given a handout on benefiber to read and follow.   Call us back if the benefiber doesn't help.     I appreciate the opportunity to care for you. Silvano Rusk, MD, [facility]

## 2015-08-29 ENCOUNTER — Ambulatory Visit (INDEPENDENT_AMBULATORY_CARE_PROVIDER_SITE_OTHER): Payer: PPO | Admitting: Physician Assistant

## 2015-08-29 ENCOUNTER — Encounter: Payer: Self-pay | Admitting: Physician Assistant

## 2015-08-29 VITALS — BP 140/100 | HR 104 | Temp 98.6°F | Ht 63.0 in | Wt 145.6 lb

## 2015-08-29 DIAGNOSIS — J202 Acute bronchitis due to streptococcus: Secondary | ICD-10-CM | POA: Insufficient documentation

## 2015-08-29 MED ORDER — ALBUTEROL SULFATE (2.5 MG/3ML) 0.083% IN NEBU
2.5000 mg | INHALATION_SOLUTION | Freq: Once | RESPIRATORY_TRACT | Status: AC
  Administered 2015-08-29: 2.5 mg via RESPIRATORY_TRACT

## 2015-08-29 MED ORDER — BENZONATATE 100 MG PO CAPS: 100.0000 mg | ORAL_CAPSULE | Freq: Two times a day (BID) | ORAL | Status: DC | PRN

## 2015-08-29 MED ORDER — AZITHROMYCIN 250 MG PO TABS: ORAL_TABLET | ORAL | Status: DC

## 2015-08-29 MED ORDER — ALBUTEROL SULFATE HFA 108 (90 BASE) MCG/ACT IN AERS: 2.0000 | INHALATION_SPRAY | RESPIRATORY_TRACT | Status: DC | PRN

## 2015-08-29 MED FILL — BENZONATATE 100 MG CAPSULE: 100 | 10 days supply | Qty: 20 | Fill #0

## 2015-08-29 MED FILL — VENTOLIN HFA 90 MCG INHALER: 108 (90 BAS | 30 days supply | Qty: 18 | Fill #0

## 2015-08-29 MED FILL — AZITHROMYCIN 250 MG TABLET: 250 | 5 days supply | Qty: 6 | Fill #0

## 2015-08-29 NOTE — Addendum Note (Signed)
Addended by: Harl Bowie on: 08/29/2015 08:12 AM   Modules accepted: Orders

## 2015-08-29 NOTE — Assessment & Plan Note (Signed)
Albuterol neb x 1 given. Rx Azithromycin and Tessalon. Albuterol inhaler refilled. Supportive measures reviewed. Follow-up if symptoms are not resolving.

## 2015-08-29 NOTE — Patient Instructions (Signed)
Take antibiotic (Azithromycin) as directed.  Increase fluids.  Get plenty of rest. Use Mucinex for congestion. Use Tessalon as directed for cough. Continue albuterol as directed. Take a daily probiotic (I recommend Align or Culturelle, but even Activia Yogurt may be beneficial).  A humidifier placed in the bedroom may offer some relief for a dry, scratchy throat of nasal irritation.  Read information below on acute bronchitis. Please call or return to clinic if symptoms are not improving.  Acute Bronchitis Bronchitis is when the airways that extend from the windpipe into the lungs get red, puffy, and painful (inflamed). Bronchitis often causes thick spit (mucus) to develop. This leads to a cough. A cough is the most common symptom of bronchitis. In acute bronchitis, the condition usually begins suddenly and goes away over time (usually in 2 weeks). Smoking, allergies, and asthma can make bronchitis worse. Repeated episodes of bronchitis may cause more lung problems.  HOME CARE  Rest.  Drink enough fluids to keep your pee (urine) clear or pale yellow (unless you need to limit fluids as told by your doctor).  Only take over-the-counter or prescription medicines as told by your doctor.  Avoid smoking and secondhand smoke. These can make bronchitis worse. If you are a smoker, think about using nicotine gum or skin patches. Quitting smoking will help your lungs heal faster.  Reduce the chance of getting bronchitis again by:  Washing your hands often.  Avoiding people with cold symptoms.  Trying not to touch your hands to your mouth, nose, or eyes.  Follow up with your doctor as told.  GET HELP IF: Your symptoms do not improve after 1 week of treatment. Symptoms include:  Cough.  Fever.  Coughing up thick spit.  Body aches.  Chest congestion.  Chills.  Shortness of breath.  Sore throat.  GET HELP RIGHT AWAY IF:   You have an increased fever.  You have chills.  You have  severe shortness of breath.  You have bloody thick spit (sputum).  You throw up (vomit) often.  You lose too much body fluid (dehydration).  You have a severe headache.  You faint.  MAKE SURE YOU:   Understand these instructions.  Will watch your condition.  Will get help right away if you are not doing well or get worse. Document Released: 12/17/2007 Document Revised: 03/02/2013 Document Reviewed: 12/21/2012 [facility] Patient Information 2015 [place], [place]. This information is not intended to replace advice given to you by your health care provider. Make sure you discuss any questions you have with your health care provider.

## 2015-08-29 NOTE — Progress Notes (Signed)
Pre visit review using our clinic review tool, if applicable. No additional management support is needed unless otherwise documented below in the visit note. 

## 2015-08-29 NOTE — Progress Notes (Signed)
Patient presents to clinic today c/o 1 week of gradually worsening productive cough and chest congestion associated with sore throat and chest tenderness with coughing. Patient endorses aches and wheezing.  Has been out of her albuterol inhaler. Denies fever, chills. Denies recent travel or sick contact.   Past Medical History  Diagnosis Date  . Hypertension   . Hyperlipemia   . Dyspepsia   . OSA (obstructive sleep apnea)   . Plantar fasciitis   . Depression   . Asthma     02/08/10 FEV1 1.98 (80%), s/p saba 2.22 l/m (90%).nml  . Anxiety   . 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 Fatty liver 12/19/2010  . Internal hemorrhoids   . Diverticulosis   . Allergy     dust, cock roach,grass,weeds,molds  . GERD (gastroesophageal reflux disease)   . Acute peptic ulcer of stomach     As a teenager  . IBS (irritable bowel syndrome)   . Cataract   . High cholesterol     Current Outpatient Prescriptions on File Prior to Visit  Medication Sig Dispense Refill  . ACCU-CHEK FASTCLIX LANCETS MISC Check Blood Glucose bid  DX 250.81 102 each 1  . aspirin 81 MG tablet Take 81 mg by mouth daily. Reported on 07/02/2015    . atorvastatin (LIPITOR) 80 MG tablet TAKE 1 TABLET (80 MG) BY MOUTH DAILY. 30 tablet 6  . Blood Glucose Monitoring Suppl (ACCU-CHEK AVIVA PLUS) W/DEVICE KIT 1 kit by Does not apply route daily. DX 250.00 1 kit 0  . diazepam (VALIUM) 5 MG tablet Take 0.5 to 1 tablet by mouth twice daily as needed for anxiety 30 tablet 1  . dicyclomine (BENTYL) 20 MG tablet Take 1 tablet (20 mg total) by mouth every 6 (six) hours as needed (cramps/diarrhea). 40 tablet 5  . fenofibrate 160 MG tablet TAKE 1 TABLET (160 MG TOTAL) BY MOUTH AT BEDTIME. 90 tablet 2  . fluticasone (FLONASE) 50 MCG/ACT nasal spray Place 1 spray into both nostrils daily. 16 g 0  . glimepiride (AMARYL) 2 MG tablet Take 1 tablet (2 mg total) by mouth 2 (two) times daily. 60 tablet 3  . glucose blood (ACCU-CHEK AVIVA PLUS) test  strip TEST BLOOD SUGARS 1 TO 2 TIMES A DAY. Dx 250.00 180 each 1  . losartan (COZAAR) 25 MG tablet Take 1 tablet (25 mg total) by mouth daily. 30 tablet 2  . Multiple Vitamins-Minerals (CENTRUM SILVER PO) Take 1 tablet by mouth daily.      . pantoprazole (PROTONIX) 40 MG tablet TAKE 1 TABLET (40 MG TOTAL) BY MOUTH DAILY. 90 tablet 1  . Probiotic Product (ALIGN) 4 MG CAPS Take 1 capsule by mouth daily. Reported on 07/02/2015    . sertraline (ZOLOFT) 50 MG tablet TAKE 1 TABLET (50 MG TOTAL) BY MOUTH DAILY. 90 tablet 1  . valACYclovir (VALTREX) 1000 MG tablet TAKE 1 TABLET (1,000 MG) BY MOUTH 2 TIMES A DAY 60 tablet 0   No current facility-administered medications on file prior to visit.    Allergies  Allergen Reactions  . Compazine [Prochlorperazine Edisylate]   . Prochlorperazine Edisylate Other (See Comments)    coma for 3 days  . Tetanus Toxoid Other (See Comments)    convulsions    Family History  Problem Relation Age of Onset  . Diabetes Mother     type II  . Heart attack Mother 40  . Ulcers      bleeding gastric  . Heart  defect      child  . Other      perforated bowel, child  . Colon cancer Sister   . Cancer Sister     colon cancer  . Asthma Maternal Grandmother   . Mental retardation Son     PTSD  . Cancer Sister     stage 4 bladder cancer    Social History   Social History  . Marital Status: Married    Spouse Name: [name]  . Number of Children: 4  . 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 Smoking status: Never Smoker   . Smokeless tobacco: Never Used  . Alcohol Use: 0.0 oz/week    0 Standard drinks or equivalent per week     Comment: Rarely  . Drug Use: No  . Sexual Activity: No     Comment: lives with her mother, no dietary restrictions   Other Topics Concern  . None   Social History Narrative   No caffeine drinks daily    Review of Systems - See HPI.  All other ROS are  negative.  BP 140/100 mmHg  Pulse 104  Temp(Src) 98.6 F (37 C) (Oral)  Ht '5\' 3"'  (1.6 m)  Wt 145 lb 9.6 oz (66.044 kg)  BMI 25.80 kg/m2  SpO2 98%  Physical Exam  Constitutional: She is oriented to person, place, and time and well-developed, well-nourished, and in no distress.  HENT:  Head: Normocephalic and atraumatic.  Right Ear: External ear normal.  Left Ear: External ear normal.  Nose: Nose normal.  Mouth/Throat: Oropharynx is clear and moist. No oropharyngeal exudate.  TM within normal limits bilaterally  Eyes: Conjunctivae are normal.  Neck: Neck supple.  Cardiovascular: Normal rate, regular rhythm, normal heart sounds and intact distal pulses.   Pulmonary/Chest: Effort normal. No respiratory distress. She has wheezes. She has no rales. She exhibits no tenderness.  Neurological: She is alert and oriented to person, place, and time.  Skin: Skin is warm and dry. No rash noted.  Psychiatric: Affect normal.  Vitals reviewed.   Recent Results (from the past 2160 hour(s))  Hemoglobin A1C     Status: Abnormal   Collection Time: 08/03/15  2:52 PM  Result Value Ref Range   Hgb A1c MFr Bld 7.2 (H) <5.7 %    Comment:                                                                        According to the ADA Clinical Practice Recommendations for 2011, when HbA1c is used as a screening test:     >=6.5%   Diagnostic of Diabetes Mellitus            (if abnormal result is confirmed)   5.7-6.4%   Increased risk of developing Diabetes Mellitus   References:Diagnosis and Classification of Diabetes Mellitus,Diabetes CMKL,4917,91(TAVWP 1):S62-S69 and Standards of Medical Care in         Diabetes - 2011,Diabetes Care,2011,34 (Suppl 1):S11-S61.      Mean Plasma Glucose 160 (H) <117 mg/dL  VITAMIN D 25 Hydroxy (Vit-D Deficiency, Fractures)     Status: Abnormal   Collection Time: 08/03/15  2:52 PM  Result Value Ref Range   Vit D, 25-Hydroxy 27 (L) 30 - 100 ng/mL    Comment: Vitamin  D Status           25-OH Vitamin D        Deficiency                <20 ng/mL        Insufficiency         20 - 29 ng/mL        Optimal             > or = 30 ng/mL   For 25-OH Vitamin D testing on patients on D2-supplementation and patients for whom quantitation of D2 and D3 fractions is required, the QuestAssureD 25-OH VIT D, (D2,D3), LC/MS/MS is recommended: order code [id number] (patients > 2 yrs).   Urinalysis     Status: Abnormal   Collection Time: 08/03/15  3:26 PM  Result Value Ref Range   Color, Urine YELLOW YELLOW    Comment: ** Please note change in unit of measure and reference range(s). **      APPearance CLEAR CLEAR   Specific Gravity, Urine 1.029 1.001 - 1.035   pH 5.0 5.0 - 8.0   Glucose, UA 3+ (A) NEGATIVE   Bilirubin Urine NEGATIVE NEGATIVE   Ketones, ur NEGATIVE NEGATIVE   Hgb urine dipstick NEGATIVE NEGATIVE   Protein, ur NEGATIVE NEGATIVE   Nitrite NEGATIVE NEGATIVE   Leukocytes, UA NEGATIVE NEGATIVE  HSV(herpes smplx)abs-1+2(IgG+IgM)-bld     Status: Abnormal   Collection Time: 08/03/15  4:50 PM  Result Value Ref Range   HSV 1 Glycoprotein G Ab, IgG 4.37 (H) IV    Comment:      IV = Index Value              < 0.90 IV              Negative              0.90-1.10 IV           Equivocal              > 1.10 IV              Positive    HSV 2 Glycoprotein G Ab, IgG 6.13 (H) IV    Comment:      IV = Index Value              < 0.90 IV              Negative              0.90-1.10 IV           Equivocal              > 1.10 IV              Positive    Herpes Simplex Vrs I&II-IgM Ab (EIA) 0.39 INDEX    Comment:                 <=0.90 . . . . . . Marland Kitchen NEGATIVE               0.91-1.09. . . . . Marland Kitchen EQUIVOCAL               >=1.10 . . . . . . Marland Kitchen POSITIVE  The results obtained with the HSV 1 & 2 IgM test should serve only as an aid to diagnosis and should not be interpreted as  diagnostic by themselves. Heterotypic IgM antibody responses may occur in patients with a history of infection with other Herpes viruses, including Epstein-Barr virus and Varicella zoster virus, and give false positive results in HSV 1 & 2 IgM tests.  This test does not distinguish between HSV 1 and HSV 2.  A positive HSV IgM may indicate primary infection, but IgM antibody can persist 12 or more months after primary infection.  For confirmation, if clinically indicated, positive IgM results could be followed with the test for HSV 1 & 2 IgG glycoprotein (test code [phone]) in 4-6 weeks.   CBG monitoring, ED     Status: Abnormal   Collection Time: 08/11/15  8:28 PM  Result Value Ref Range   Glucose-Capillary 156 (H) 65 - 99 mg/dL    Assessment/Plan: Acute bronchitis due to Streptococcus Albuterol neb x 1 given. Rx Azithromycin and Tessalon. Albuterol inhaler refilled. Supportive measures reviewed. Follow-up if symptoms are not resolving.

## 2015-08-30 ENCOUNTER — Telehealth: Payer: Self-pay | Admitting: Family Medicine

## 2015-08-30 NOTE — Telephone Encounter (Signed)
Caller name: Self  Can be reached: [phone]   Reason for call: Patient called to ask if she could be hospitalized because she is feeling worse than she did when she was seen yesterday.

## 2015-08-30 NOTE — Telephone Encounter (Signed)
She can come in to be seen but we usually cannot direct admit to hospital any more. If she is sicker she may need to go to ED if she thinks she is ill enough to get admitted

## 2015-08-31 NOTE — Telephone Encounter (Signed)
Called left message to call back 

## 2015-08-31 NOTE — Telephone Encounter (Signed)
Called the patient informed of PCP instructions.  She stated she is feeling better today.  Coughing, sneezing and ribs aching (when coughing) were bad yesterday, but today she is feeling better and thinks medication is working.  She did understand to go to the ER if worsens over the weekend.  Did not want appt. Today as felt doing better and not needed.

## 2015-09-05 ENCOUNTER — Encounter: Payer: Self-pay | Admitting: Physician Assistant

## 2015-09-05 ENCOUNTER — Ambulatory Visit (HOSPITAL_BASED_OUTPATIENT_CLINIC_OR_DEPARTMENT_OTHER)
Admission: RE | Admit: 2015-09-05 | Discharge: 2015-09-05 | Disposition: A | Discharge status: Home / Self Care | Payer: PPO | Source: Ambulatory Visit | Attending: Physician Assistant | Admitting: Physician Assistant

## 2015-09-05 ENCOUNTER — Ambulatory Visit (INDEPENDENT_AMBULATORY_CARE_PROVIDER_SITE_OTHER): Payer: PPO | Admitting: Physician Assistant

## 2015-09-05 VITALS — BP 146/82 | HR 88 | Temp 98.0°F | Ht 63.0 in | Wt 149.8 lb

## 2015-09-05 DIAGNOSIS — J202 Acute bronchitis due to streptococcus: Secondary | ICD-10-CM

## 2015-09-05 MED ORDER — BENZONATATE 100 MG PO CAPS: 100.0000 mg | ORAL_CAPSULE | Freq: Two times a day (BID) | ORAL | Status: DC | PRN

## 2015-09-05 MED ORDER — DOXYCYCLINE HYCLATE 100 MG PO CAPS: 100.0000 mg | ORAL_CAPSULE | Freq: Two times a day (BID) | ORAL | Status: DC

## 2015-09-05 MED ORDER — PREDNISONE 20 MG PO TABS: 20.0000 mg | ORAL_TABLET | Freq: Every day | ORAL | Status: DC

## 2015-09-05 MED FILL — BENZONATATE 100 MG CAPSULE: 100 | 10 days supply | Qty: 20 | Fill #0

## 2015-09-05 MED FILL — DOXYCYCLINE HYC 100 MG CAP: 100 | 10 days supply | Qty: 20 | Fill #0

## 2015-09-05 MED FILL — predniSONE 20 MG TABS: 20 | 10 days supply | Qty: 10 | Fill #0

## 2015-09-05 NOTE — Patient Instructions (Signed)
Go downstairs for chest x-ray. I will call you with your results.  Take antibiotic(Doxycycline) as directed.  Increase fluids.  Get plenty of rest. Use Mucinex for congestion. Take prednisone as directed. Take a daily probiotic (I recommend Align or Culturelle, but even Activia Yogurt may be beneficial).  A humidifier placed in the bedroom may offer some relief for a dry, scratchy throat of nasal irritation.  Read information below on acute bronchitis. Please call or return to clinic if symptoms are not improving.  Acute Bronchitis Bronchitis is when the airways that extend from the windpipe into the lungs get red, puffy, and painful (inflamed). Bronchitis often causes thick spit (mucus) to develop. This leads to a cough. A cough is the most common symptom of bronchitis. In acute bronchitis, the condition usually begins suddenly and goes away over time (usually in 2 weeks). Smoking, allergies, and asthma can make bronchitis worse. Repeated episodes of bronchitis may cause more lung problems.  HOME CARE  Rest.  Drink enough fluids to keep your pee (urine) clear or pale yellow (unless you need to limit fluids as told by your doctor).  Only take over-the-counter or prescription medicines as told by your doctor.  Avoid smoking and secondhand smoke. These can make bronchitis worse. If you are a smoker, think about using nicotine gum or skin patches. Quitting smoking will help your lungs heal faster.  Reduce the chance of getting bronchitis again by:  Washing your hands often.  Avoiding people with cold symptoms.  Trying not to touch your hands to your mouth, nose, or eyes.  Follow up with your doctor as told.  GET HELP IF: Your symptoms do not improve after 1 week of treatment. Symptoms include:  Cough.  Fever.  Coughing up thick spit.  Body aches.  Chest congestion.  Chills.  Shortness of breath.  Sore throat.  GET HELP RIGHT AWAY IF:   You have an increased fever.  You  have chills.  You have severe shortness of breath.  You have bloody thick spit (sputum).  You throw up (vomit) often.  You lose too much body fluid (dehydration).  You have a severe headache.  You faint.  MAKE SURE YOU:   Understand these instructions.  Will watch your condition.  Will get help right away if you are not doing well or get worse. Document Released: 12/17/2007 Document Revised: 03/02/2013 Document Reviewed: 12/21/2012 [facility] Patient Information 2015 [place], [place]. This information is not intended to replace advice given to you by your health care provider. Make sure you discuss any questions you have with your health care provider.

## 2015-09-05 NOTE — Assessment & Plan Note (Signed)
Rx Doxycycline. Rx Prednisone burst. Continue Tessalon and supportive measures. Will check CXR today.

## 2015-09-05 NOTE — Progress Notes (Signed)
Pre visit review using our clinic review tool, if applicable. No additional management support is needed unless otherwise documented below in the visit note. 

## 2015-09-05 NOTE — Progress Notes (Signed)
Patient presents to clinic today c/o continued cough and chest congestion despite completing a z-pack. Denies new or worsening symptoms but still having persistent cough that is causing chest tenderness and keeping her from sleep.    Past Medical History  Diagnosis Date  . Hypertension   . Hyperlipemia   . Dyspepsia   . OSA (obstructive sleep apnea)   . Plantar fasciitis   . Depression   . Asthma     02/08/10 FEV1 1.98 (80%), s/p saba 2.22 l/m (90%).nml  . Anxiety   . 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 Fatty liver 12/19/2010  . Internal hemorrhoids   . Diverticulosis   . Allergy     dust, cock roach,grass,weeds,molds  . GERD (gastroesophageal reflux disease)   . Acute peptic ulcer of stomach     As a teenager  . IBS (irritable bowel syndrome)   . Cataract   . High cholesterol     Current Outpatient Prescriptions on File Prior to Visit  Medication Sig Dispense Refill  . ACCU-CHEK FASTCLIX LANCETS MISC Check Blood Glucose bid  DX 250.81 102 each 1  . albuterol (PROAIR HFA) 108 (90 Base) MCG/ACT inhaler Inhale 2 puffs into the lungs every 4 (four) hours as needed. Shortness of breath and wheezing 18 g 0  . aspirin 81 MG tablet Take 81 mg by mouth daily. Reported on 07/02/2015    . atorvastatin (LIPITOR) 80 MG tablet TAKE 1 TABLET (80 MG) BY MOUTH DAILY. 30 tablet 6  . benzonatate (TESSALON) 100 MG capsule Take 1 capsule (100 mg total) by mouth 2 (two) times daily as needed for cough. 20 capsule 0  . Blood Glucose Monitoring Suppl (ACCU-CHEK AVIVA PLUS) W/DEVICE KIT 1 kit by Does not apply route daily. DX 250.00 1 kit 0  . diazepam (VALIUM) 5 MG tablet Take 0.5 to 1 tablet by mouth twice daily as needed for anxiety 30 tablet 1  . dicyclomine (BENTYL) 20 MG tablet Take 1 tablet (20 mg total) by mouth every 6 (six) hours as needed (cramps/diarrhea). 40 tablet 5  . fenofibrate 160 MG tablet TAKE 1 TABLET (160 MG TOTAL) BY MOUTH AT BEDTIME. 90 tablet 2  . fluticasone (FLONASE) 50  MCG/ACT nasal spray Place 1 spray into both nostrils daily. 16 g 0  . glimepiride (AMARYL) 2 MG tablet Take 1 tablet (2 mg total) by mouth 2 (two) times daily. 60 tablet 3  . glucose blood (ACCU-CHEK AVIVA PLUS) test strip TEST BLOOD SUGARS 1 TO 2 TIMES A DAY. Dx 250.00 180 each 1  . losartan (COZAAR) 25 MG tablet Take 1 tablet (25 mg total) by mouth daily. 30 tablet 2  . Multiple Vitamins-Minerals (CENTRUM SILVER PO) Take 1 tablet by mouth daily.      . pantoprazole (PROTONIX) 40 MG tablet TAKE 1 TABLET (40 MG TOTAL) BY MOUTH DAILY. 90 tablet 1  . Probiotic Product (ALIGN) 4 MG CAPS Take 1 capsule by mouth daily. Reported on 07/02/2015    . sertraline (ZOLOFT) 50 MG tablet TAKE 1 TABLET (50 MG TOTAL) BY MOUTH DAILY. 90 tablet 1  . valACYclovir (VALTREX) 1000 MG tablet TAKE 1 TABLET (1,000 MG) BY MOUTH 2 TIMES A DAY 60 tablet 0   No current facility-administered medications on file prior to visit.    Allergies  Allergen Reactions  . Compazine [Prochlorperazine Edisylate]   . Prochlorperazine Edisylate Other (See Comments)    coma for 3 days  . Tetanus Toxoid Other (See  Comments)    convulsions    Family History  Problem Relation Age of Onset  . Diabetes Mother     type II  . Heart attack Mother 9  . Ulcers      bleeding gastric  . Heart defect      child  . Other      perforated bowel, child  . Colon cancer Sister   . Cancer Sister     colon cancer  . Asthma Maternal Grandmother   . Mental retardation Son     PTSD  . Cancer Sister     stage 4 bladder cancer    Social History   Social History  . Marital Status: Married    Spouse Name: [name]  . Number of Children: 4  . 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 Smoking status: Never Smoker   . Smokeless tobacco: Never Used  . Alcohol Use: 0.0 oz/week    0 Standard drinks or equivalent per week     Comment: Rarely  . Drug Use: No  . Sexual Activity: No       Comment: lives with her mother, no dietary restrictions   Other Topics Concern  . None   Social History Narrative   No caffeine drinks daily    Review of Systems - See HPI.  All other ROS are negative.  BP 146/82 mmHg  Pulse 88  Temp(Src) 98 F (36.7 C) (Oral)  Ht '5\' 3"'  (1.6 m)  Wt 149 lb 12.8 oz (67.949 kg)  BMI 26.54 kg/m2  SpO2 97%  Physical Exam  Constitutional: She is oriented to person, place, and time and well-developed, well-nourished, and in no distress.  HENT:  Head: Normocephalic and atraumatic.  Right Ear: External ear normal.  Left Ear: External ear normal.  Nose: Nose normal.  Mouth/Throat: Oropharynx is clear and moist. No oropharyngeal exudate.  TM within normal limits bilaterally.  Eyes: Conjunctivae are normal.  Neck: Neck supple.  Cardiovascular: Normal rate, regular rhythm, normal heart sounds and intact distal pulses.   Pulmonary/Chest: No respiratory distress. She has wheezes. She has no rales. She exhibits no tenderness.  Neurological: She is alert and oriented to person, place, and time.  Skin: Skin is warm and dry. No rash noted.  Psychiatric: Affect normal.    Recent Results (from the past 2160 hour(s))  Hemoglobin A1C     Status: Abnormal   Collection Time: 08/03/15  2:52 PM  Result Value Ref Range   Hgb A1c MFr Bld 7.2 (H) <5.7 %    Comment:                                                                        According to the ADA Clinical Practice Recommendations for 2011, when HbA1c is used as a screening test:     >=6.5%   Diagnostic of Diabetes Mellitus            (if abnormal result is confirmed)   5.7-6.4%   Increased risk of developing Diabetes Mellitus   References:Diagnosis and Classification of Diabetes Mellitus,Diabetes BPZW,2585,27(POEUM 1):S62-S69 and Standards of Medical Care in  Diabetes - 2011,Diabetes Care,2011,34 (Suppl 1):S11-S61.      Mean Plasma Glucose 160 (H) <117 mg/dL  VITAMIN D 25 Hydroxy  (Vit-D Deficiency, Fractures)     Status: Abnormal   Collection Time: 08/03/15  2:52 PM  Result Value Ref Range   Vit D, 25-Hydroxy 27 (L) 30 - 100 ng/mL    Comment: Vitamin D Status           25-OH Vitamin D        Deficiency                <20 ng/mL        Insufficiency         20 - 29 ng/mL        Optimal             > or = 30 ng/mL   For 25-OH Vitamin D testing on patients on D2-supplementation and patients for whom quantitation of D2 and D3 fractions is required, the QuestAssureD 25-OH VIT D, (D2,D3), LC/MS/MS is recommended: order code [id number] (patients > 2 yrs).   Urinalysis     Status: Abnormal   Collection Time: 08/03/15  3:26 PM  Result Value Ref Range   Color, Urine YELLOW YELLOW    Comment: ** Please note change in unit of measure and reference range(s). **      APPearance CLEAR CLEAR   Specific Gravity, Urine 1.029 1.001 - 1.035   pH 5.0 5.0 - 8.0   Glucose, UA 3+ (A) NEGATIVE   Bilirubin Urine NEGATIVE NEGATIVE   Ketones, ur NEGATIVE NEGATIVE   Hgb urine dipstick NEGATIVE NEGATIVE   Protein, ur NEGATIVE NEGATIVE   Nitrite NEGATIVE NEGATIVE   Leukocytes, UA NEGATIVE NEGATIVE  HSV(herpes smplx)abs-1+2(IgG+IgM)-bld     Status: Abnormal   Collection Time: 08/03/15  4:50 PM  Result Value Ref Range   HSV 1 Glycoprotein G Ab, IgG 4.37 (H) IV    Comment:      IV = Index Value              < 0.90 IV              Negative              0.90-1.10 IV           Equivocal              > 1.10 IV              Positive    HSV 2 Glycoprotein G Ab, IgG 6.13 (H) IV    Comment:      IV = Index Value              < 0.90 IV              Negative              0.90-1.10 IV           Equivocal              > 1.10 IV              Positive    Herpes Simplex Vrs I&II-IgM Ab (EIA) 0.39 INDEX    Comment:                 <=0.90 . . . . . . Marland Kitchen NEGATIVE               0.91-1.09. . . . . Marland Kitchen  EQUIVOCAL               >=1.10 . . . . . . Marland Kitchen POSITIVE                                                                                                    The results obtained with the HSV 1 & 2 IgM test should serve only as an aid to diagnosis and should not be interpreted as diagnostic by themselves. Heterotypic IgM antibody responses may occur in patients with a history of infection with other Herpes viruses, including Epstein-Barr virus and Varicella zoster virus, and give false positive results in HSV 1 & 2 IgM tests.  This test does not distinguish between HSV 1 and HSV 2.  A positive HSV IgM may indicate primary infection, but IgM antibody can persist 12 or more months after primary infection.  For confirmation, if clinically indicated, positive IgM results could be followed with the test for HSV 1 & 2 IgG glycoprotein (test code [id number]) in 4-6 weeks.   CBG monitoring, ED     Status: Abnormal   Collection Time: 08/11/15  8:28 PM  Result Value Ref Range   Glucose-Capillary 156 (H) 65 - 99 mg/dL    Assessment/Plan: Acute bronchitis due to Streptococcus Rx Doxycycline. Rx Prednisone burst. Continue Tessalon and supportive measures. Will check CXR today.

## 2015-09-11 ENCOUNTER — Other Ambulatory Visit: Payer: Self-pay | Admitting: Family Medicine

## 2015-09-12 ENCOUNTER — Telehealth: Payer: Self-pay | Admitting: *Deleted

## 2015-09-12 DIAGNOSIS — E038 Other specified hypothyroidism: Secondary | ICD-10-CM

## 2015-09-12 DIAGNOSIS — E039 Hypothyroidism, unspecified: Secondary | ICD-10-CM

## 2015-09-12 MED ORDER — GLUCOSE BLOOD VI STRP: ORAL_STRIP | Status: DC

## 2015-09-12 MED ORDER — ONETOUCH ULTRASOFT LANCETS MISC: Status: DC

## 2015-09-12 MED ORDER — ONETOUCH ULTRA SYSTEM W/DEVICE KIT: PACK | Status: DC

## 2015-09-12 MED FILL — ONE TOUCH ULTRA 2 GLUCOSE S: W/DEVICE | 1 days supply | Qty: 1 | Fill #0

## 2015-09-12 MED FILL — ONE TOUCH DELICA 33G LANCET: 50 days supply | Qty: 100 | Fill #0

## 2015-09-12 MED FILL — ONE TOUCH ULTRA TEST STRIPS: 50 days supply | Qty: 100 | Fill #0

## 2015-09-12 NOTE — Telephone Encounter (Signed)
Received note from pharmacy stating that pt's Insurance will no longer cover Accu-chek, request change to OneTouch; sent new Rx for OneTouch meter, strips & lancets, called to check that prescriptions were received/SLS 03/01

## 2015-09-13 MED FILL — LOSARTAN POTASSIUM 25 MG TA: 25 | 30 days supply | Qty: 30 | Fill #1

## 2015-09-13 MED FILL — GLIMEPIRIDE 2 MG TABLET: 2 | 30 days supply | Qty: 60 | Fill #1

## 2015-09-17 MED FILL — FENOFIBRATE 160 MG TABLET: 160 | 30 days supply | Qty: 30 | Fill #6

## 2015-09-27 MED FILL — ATORVASTATIN 80 MG TABLET: 80 | 30 days supply | Qty: 30 | Fill #1

## 2015-10-16 ENCOUNTER — Ambulatory Visit: Payer: PPO | Admitting: Family Medicine

## 2015-10-30 MED FILL — GLIMEPIRIDE 2 MG TABLET: 2 | 30 days supply | Qty: 60 | Fill #2

## 2015-10-30 MED FILL — FENOFIBRATE 160 MG TABLET: 160 | 30 days supply | Qty: 30 | Fill #7

## 2015-10-30 MED FILL — LOSARTAN POTASSIUM 25 MG TA: 25 | 30 days supply | Qty: 30 | Fill #2

## 2015-10-30 MED FILL — ATORVASTATIN 80 MG TABLET: 80 | 30 days supply | Qty: 30 | Fill #2

## 2015-10-30 MED FILL — PANTOPRAZOLE SOD DR 40 MG T: 40 | 90 days supply | Qty: 90 | Fill #1

## 2015-11-27 ENCOUNTER — Other Ambulatory Visit (INDEPENDENT_AMBULATORY_CARE_PROVIDER_SITE_OTHER): Payer: Medicare HMO

## 2015-11-27 DIAGNOSIS — M8580 Other specified disorders of bone density and structure, unspecified site: Secondary | ICD-10-CM

## 2015-11-27 DIAGNOSIS — E785 Hyperlipidemia, unspecified: Secondary | ICD-10-CM | POA: Diagnosis not present

## 2015-11-27 DIAGNOSIS — I10 Essential (primary) hypertension: Secondary | ICD-10-CM

## 2015-11-27 DIAGNOSIS — E1149 Type 2 diabetes mellitus with other diabetic neurological complication: Secondary | ICD-10-CM

## 2015-11-27 LAB — COMPREHENSIVE METABOLIC PANEL
ALT: 19 U/L (ref 0–35)
AST: 16 U/L (ref 0–37)
Albumin: 4.2 g/dL (ref 3.5–5.2)
Alkaline Phosphatase: 57 U/L (ref 39–117)
BUN: 13 mg/dL (ref 6–23)
CHLORIDE: 107 meq/L (ref 96–112)
CO2: 24 meq/L (ref 19–32)
CREATININE: 0.65 mg/dL (ref 0.40–1.20)
Calcium: 9.4 mg/dL (ref 8.4–10.5)
GFR: 96.75 mL/min (ref >60.00)
Glucose, Bld: 163 mg/dL — ABNORMAL HIGH (ref 70–99)
POTASSIUM: 4 meq/L (ref 3.5–5.1)
SODIUM: 140 meq/L (ref 135–145)
Total Bilirubin: 0.6 mg/dL (ref 0.2–1.2)
Total Protein: 7 g/dL (ref 6.0–8.3)

## 2015-11-27 LAB — LIPID PANEL
CHOL/HDL RATIO: 4
CHOLESTEROL: 161 mg/dL (ref 0–200)
HDL: 42.4 mg/dL (ref >39.00)
LDL CALC: 92 mg/dL (ref 0–99)
NonHDL: 118.45
Triglycerides: 131 mg/dL (ref 0.0–149.0)
VLDL: 26.2 mg/dL (ref 0.0–40.0)

## 2015-11-27 LAB — CBC WITH DIFFERENTIAL/PLATELET
BASOS PCT: 0.4 % (ref 0.0–3.0)
Basophils Absolute: 0 10*3/uL (ref 0.0–0.1)
EOS ABS: 0.1 10*3/uL (ref 0.0–0.7)
EOS PCT: 2 % (ref 0.0–5.0)
HCT: 40.1 % (ref 36.0–46.0)
HEMOGLOBIN: 13.7 g/dL (ref 12.0–15.0)
LYMPHS ABS: 2.4 10*3/uL (ref 0.7–4.0)
Lymphocytes Relative: 41.2 % (ref 12.0–46.0)
MCHC: 34.1 g/dL (ref 30.0–36.0)
MCV: 91.1 fl (ref 78.0–100.0)
MONO ABS: 0.4 10*3/uL (ref 0.1–1.0)
Monocytes Relative: 6.6 % (ref 3.0–12.0)
NEUTROS PCT: 49.8 % (ref 43.0–77.0)
Neutro Abs: 2.9 10*3/uL (ref 1.4–7.7)
Platelets: 251 10*3/uL (ref 150.0–400.0)
RBC: 4.4 Mil/uL (ref 3.87–5.11)
RDW: 12.4 % (ref 11.5–15.5)
WBC: 5.8 10*3/uL (ref 4.0–10.5)

## 2015-11-27 LAB — VITAMIN D 25 HYDROXY (VIT D DEFICIENCY, FRACTURES): VITD: 26.65 ng/mL — AB (ref 30.00–100.00)

## 2015-11-27 LAB — TSH: TSH: 4.67 u[IU]/mL — AB (ref 0.35–4.50)

## 2015-11-27 LAB — HEMOGLOBIN A1C: HEMOGLOBIN A1C: 7.2 % — AB (ref 4.6–6.5)

## 2015-11-29 ENCOUNTER — Other Ambulatory Visit: Payer: Self-pay | Admitting: Family Medicine

## 2015-11-29 MED ORDER — ERGOCALCIFEROL 1.25 MG (50000 UT) PO CAPS: 50000.0000 [IU] | ORAL_CAPSULE | ORAL | Status: DC

## 2015-11-29 MED FILL — VIT D2 1.25 MG (50,000 UNIT: 1.25 MG | 84 days supply | Qty: 12 | Fill #0

## 2015-12-03 ENCOUNTER — Encounter: Payer: Self-pay | Admitting: Family Medicine

## 2015-12-03 ENCOUNTER — Ambulatory Visit (INDEPENDENT_AMBULATORY_CARE_PROVIDER_SITE_OTHER): Payer: Medicare HMO | Admitting: Family Medicine

## 2015-12-03 VITALS — BP 132/86 | HR 74 | Temp 98.6°F | Ht 63.0 in | Wt 150.2 lb

## 2015-12-03 DIAGNOSIS — F341 Dysthymic disorder: Secondary | ICD-10-CM

## 2015-12-03 DIAGNOSIS — E1149 Type 2 diabetes mellitus with other diabetic neurological complication: Secondary | ICD-10-CM

## 2015-12-03 DIAGNOSIS — E038 Other specified hypothyroidism: Secondary | ICD-10-CM | POA: Diagnosis not present

## 2015-12-03 DIAGNOSIS — F418 Other specified anxiety disorders: Secondary | ICD-10-CM

## 2015-12-03 DIAGNOSIS — E785 Hyperlipidemia, unspecified: Secondary | ICD-10-CM | POA: Diagnosis not present

## 2015-12-03 DIAGNOSIS — K760 Fatty (change of) liver, not elsewhere classified: Secondary | ICD-10-CM

## 2015-12-03 DIAGNOSIS — I10 Essential (primary) hypertension: Secondary | ICD-10-CM

## 2015-12-03 DIAGNOSIS — M8580 Other specified disorders of bone density and structure, unspecified site: Secondary | ICD-10-CM | POA: Diagnosis not present

## 2015-12-03 DIAGNOSIS — E039 Hypothyroidism, unspecified: Secondary | ICD-10-CM

## 2015-12-03 MED ORDER — DIAZEPAM 5 MG PO TABS: ORAL_TABLET | ORAL | Status: DC

## 2015-12-03 MED ORDER — ESCITALOPRAM OXALATE 10 MG PO TABS: 10.0000 mg | ORAL_TABLET | Freq: Every day | ORAL | Status: DC

## 2015-12-03 MED FILL — diazePAM 5 MG TABS: 5 | 20 days supply | Qty: 40 | Fill #0

## 2015-12-03 MED FILL — ESCITALOPRAM 10 MG TABLET: 10 | 30 days supply | Qty: 30 | Fill #0

## 2015-12-03 NOTE — Assessment & Plan Note (Signed)
Well controlled, no changes to meds. Encouraged heart healthy diet such as the DASH diet and exercise as tolerated.  °

## 2015-12-03 NOTE — Assessment & Plan Note (Signed)
hgba1c acceptable, minimize simple carbs. Increase exercise as tolerated. Continue current meds 

## 2015-12-03 NOTE — Patient Instructions (Signed)

## 2015-12-03 NOTE — Progress Notes (Signed)
Patient ID: Crystal [name], female   DOB: 1950-11-24, 65 y.o.   MRN: [id number]   Subjective:    Patient ID: Crystal [name], female    DOB: 09-01-1950, 65 y.o.   MRN: [id number]  Chief Complaint  Patient presents with  . Follow-up    HPI Patient is in today for follow up. She feels well, no recent illness or acute concerns. She is frustrated with weight gain. Denies CP/palp/SOB/HA/congestion/fevers/GI or GU c/o. Taking meds as prescribed  Past Medical History  Diagnosis Date  . Hypertension   . Hyperlipemia   . Dyspepsia   . OSA (obstructive sleep apnea)   . Plantar fasciitis   . Depression   . Asthma     02/08/10 FEV1 1.98 (80%), s/p saba 2.22 l/m (90%).nml  . Anxiety   . 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 Fatty liver 12/19/2010  . Internal hemorrhoids   . Diverticulosis   . Allergy     dust, cock roach,grass,weeds,molds  . GERD (gastroesophageal reflux disease)   . Acute peptic ulcer of stomach     As a teenager  . IBS (irritable bowel syndrome)   . Cataract   . High cholesterol     Past Surgical History  Procedure Laterality Date  . Knee arthroscopy Left 1998  . Toe surgery Right 1995  . Nasal septum surgery    . Rhinoplasty    . Appendectomy      age 28  . Colonoscopy  06/03/2010, 08/25/2011    diverticulosis   . Gastrectomy      partial - ulcer as a teen  . Hand surgery Right   . Cataract extraction, bilateral      12/8 and 12/14    Family History  Problem Relation Age of Onset  . Diabetes Mother     type II  . Heart attack Mother 30  . Ulcers      bleeding gastric  . Heart defect      child  . Other      perforated bowel, child  . Colon cancer Sister   . Cancer Sister     colon cancer  . Asthma Maternal Grandmother   . Mental retardation Son     PTSD  . Cancer Sister     stage 4 bladder cancer    Social History   Social History  . Marital Status: Married    Spouse Name: [name]  . Number of Children: 4  . Years of  Education: N/A   Occupational History  . 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 Smoking status: Never Smoker   . Smokeless tobacco: Never Used  . Alcohol Use: 0.0 oz/week    0 Standard drinks or equivalent per week     Comment: Rarely  . Drug Use: No  . Sexual Activity: No     Comment: lives with her mother, no dietary restrictions   Other Topics Concern  . Not on file   Social History Narrative   No caffeine drinks daily     Outpatient Prescriptions Prior to Visit  Medication Sig Dispense Refill  . atorvastatin (LIPITOR) 80 MG tablet TAKE 1 TABLET (80 MG) BY MOUTH DAILY. 30 tablet 6  . ergocalciferol (VITAMIN D2) 50000 units capsule Take 1 capsule (50,000 Units total) by mouth once a week. 12 capsule 0  . fenofibrate 160 MG tablet TAKE 1 TABLET (160 MG TOTAL) BY MOUTH AT BEDTIME. 90 tablet 2  .  glimepiride (AMARYL) 2 MG tablet Take 1 tablet (2 mg total) by mouth 2 (two) times daily. 60 tablet 3  . glucose blood test strip USE AS DIRECTED TO CHECK BLOOD SUGAR 2 TIMES A DAY Dx: E11.49 100 each 12  . Lancets (ONETOUCH ULTRASOFT) lancets USE AS DIRECTED TO CHECK BLOOD SUGAR 2 TIMES A DAY Dx: 11.49 100 each 12  . losartan (COZAAR) 25 MG tablet Take 1 tablet (25 mg total) by mouth daily. 30 tablet 2  . Multiple Vitamins-Minerals (CENTRUM SILVER PO) Take 1 tablet by mouth daily.      . Probiotic Product (ALIGN) 4 MG CAPS Take 1 capsule by mouth daily. Reported on 07/02/2015    . albuterol (PROAIR HFA) 108 (90 Base) MCG/ACT inhaler Inhale 2 puffs into the lungs every 4 (four) hours as needed. Shortness of breath and wheezing 18 g 0  . aspirin 81 MG tablet Take 81 mg by mouth daily. Reported on 07/02/2015    . Blood Glucose Monitoring Suppl (ONE TOUCH ULTRA SYSTEM KIT) w/Device KIT USE AS DIRECTED TO CHECK BLOOD SUGAR 2 TIMES A DAY Dx: E11.49 1 each 0  . diazepam (VALIUM) 5 MG tablet Take 0.5 to 1 tablet by mouth twice daily as needed for anxiety 30 tablet 1  . dicyclomine  (BENTYL) 20 MG tablet Take 1 tablet (20 mg total) by mouth every 6 (six) hours as needed (cramps/diarrhea). 40 tablet 5  . fluticasone (FLONASE) 50 MCG/ACT nasal spray Place 1 spray into both nostrils daily. 16 g 0  . pantoprazole (PROTONIX) 40 MG tablet TAKE 1 TABLET (40 MG TOTAL) BY MOUTH DAILY. 90 tablet 1  . sertraline (ZOLOFT) 50 MG tablet TAKE 1 TABLET (50 MG TOTAL) BY MOUTH DAILY. 90 tablet 1  . benzonatate (TESSALON) 100 MG capsule Take 1 capsule (100 mg total) by mouth 2 (two) times daily as needed for cough. 20 capsule 0  . doxycycline (VIBRAMYCIN) 100 MG capsule Take 1 capsule (100 mg total) by mouth 2 (two) times daily. 20 capsule 0  . predniSONE (DELTASONE) 20 MG tablet Take 1 tablet (20 mg total) by mouth daily with breakfast. 10 tablet 0  . valACYclovir (VALTREX) 1000 MG tablet TAKE 1 TABLET (1,000 MG) BY MOUTH 2 TIMES A DAY 60 tablet 0   No facility-administered medications prior to visit.    Allergies  Allergen Reactions  . Compazine [Prochlorperazine Edisylate]   . Prochlorperazine Edisylate Other (See Comments)    coma for 3 days  . Tetanus Toxoid Other (See Comments)    convulsions    Review of Systems  Constitutional: Negative for fever and malaise/fatigue.  HENT: Negative for congestion.   Eyes: Negative for blurred vision.  Respiratory: Negative for shortness of breath.   Cardiovascular: Negative for chest pain, palpitations and leg swelling.  Gastrointestinal: Negative for nausea, abdominal pain and blood in stool.  Genitourinary: Negative for dysuria and frequency.  Musculoskeletal: Negative for falls.  Skin: Negative for rash.  Neurological: Negative for dizziness, loss of consciousness and headaches.  Endo/Heme/Allergies: Negative for environmental allergies.  Psychiatric/Behavioral: Positive for depression. The patient is nervous/anxious.        Objective:    Physical Exam  Constitutional: She is oriented to person, place, and time. She appears  well-developed and well-nourished. No distress.  HENT:  Head: Normocephalic and atraumatic.  Nose: Nose normal.  Eyes: Right eye exhibits no discharge. Left eye exhibits no discharge.  Neck: Normal range of motion. Neck supple.  Cardiovascular: Normal rate and regular  rhythm.   No murmur heard. Pulmonary/Chest: Effort normal and breath sounds normal.  Abdominal: Soft. Bowel sounds are normal. There is no tenderness.  Musculoskeletal: She exhibits no edema.  Neurological: She is alert and oriented to person, place, and time.  Skin: Skin is warm and dry.  Psychiatric: She has a normal mood and affect.  Nursing note and vitals reviewed.   BP 132/86 mmHg  Pulse 74  Temp(Src) 98.6 F (37 C) (Oral)  Ht _0  (1.6 m)  Wt 150 lb 4 oz (68.153 kg)  BMI 26.62 kg/m2  SpO2 97% Wt Readings from Last 3 Encounters:  12/03/15 150 lb 4 oz (68.153 kg)  09/05/15 149 lb 12.8 oz (67.949 kg)  08/29/15 145 lb 9.6 oz (66.044 kg)     Lab Results  Component Value Date   WBC 5.8 11/27/2015   HGB 13.7 11/27/2015   HCT 40.1 11/27/2015   PLT 251.0 11/27/2015   GLUCOSE 163* 11/27/2015   CHOL 161 11/27/2015   TRIG 131.0 11/27/2015   HDL 42.40 11/27/2015   LDLDIRECT 123.0 09/18/2014   LDLCALC 92 11/27/2015   ALT 19 11/27/2015   AST 16 11/27/2015   NA 140 11/27/2015   K 4.0 11/27/2015   CL 107 11/27/2015   CREATININE 0.65 11/27/2015   BUN 13 11/27/2015   CO2 24 11/27/2015   TSH 4.67* 11/27/2015   HGBA1C 7.2* 11/27/2015   MICROALBUR 1.3 04/30/2015    Lab Results  Component Value Date   TSH 4.67* 11/27/2015   Lab Results  Component Value Date   WBC 5.8 11/27/2015   HGB 13.7 11/27/2015   HCT 40.1 11/27/2015   MCV 91.1 11/27/2015   PLT 251.0 11/27/2015   Lab Results  Component Value Date   NA 140 11/27/2015   K 4.0 11/27/2015   CO2 24 11/27/2015   GLUCOSE 163* 11/27/2015   BUN 13 11/27/2015   CREATININE 0.65 11/27/2015   BILITOT 0.6 11/27/2015   ALKPHOS 57 11/27/2015   AST 16  11/27/2015   ALT 19 11/27/2015   PROT 7.0 11/27/2015   ALBUMIN 4.2 11/27/2015   CALCIUM 9.4 11/27/2015   ANIONGAP 7 01/08/2015   GFR 96.75 11/27/2015   Lab Results  Component Value Date   CHOL 161 11/27/2015   Lab Results  Component Value Date   HDL 42.40 11/27/2015   Lab Results  Component Value Date   LDLCALC 92 11/27/2015   Lab Results  Component Value Date   TRIG 131.0 11/27/2015   Lab Results  Component Value Date   CHOLHDL 4 11/27/2015   Lab Results  Component Value Date   HGBA1C 7.2* 11/27/2015       Assessment & Plan:   Problem List Items Addressed This Visit    Subclinical hypothyroidism - Primary   Relevant Orders   CBC   TSH   Hemoglobin A1c   Lipid panel   Comprehensive metabolic panel   Osteopenia    Encouraged to get adequate exercise, calcium and vitamin d intake      Relevant Orders   CBC   TSH   Hemoglobin A1c   Lipid panel   Comprehensive metabolic panel   Hyperlipidemia    Tolerating statin, encouraged heart healthy diet, avoid trans fats, minimize simple carbs and saturated fats. Increase exercise as tolerated      Relevant Orders   CBC   TSH   Hemoglobin A1c   Lipid panel   Comprehensive metabolic panel   Fatty liver  Relevant Orders   CBC   TSH   Hemoglobin A1c   Lipid panel   Comprehensive metabolic panel   Essential hypertension    Well controlled, no changes to meds. Encouraged heart healthy diet such as the DASH diet and exercise as tolerated.       Relevant Orders   CBC   TSH   Hemoglobin A1c   Lipid panel   Comprehensive metabolic panel   DM (diabetes mellitus), type 2 with neurological complications (HCC)    [id number] acceptable, minimize simple carbs. Increase exercise as tolerated. Continue current meds      Relevant Orders   CBC   TSH   Hemoglobin A1c   Lipid panel   Comprehensive metabolic panel   DEPRESSION/ANXIETY    Daughter's divorce is causin a great deal of stress she agrees to start  Lexapro and can continue Valium prn      Relevant Medications   diazepam (VALIUM) 5 MG tablet   escitalopram (LEXAPRO) 10 MG tablet   Depression with anxiety      I have discontinued Ms. Sinn's aspirin, fluticasone, dicyclomine, sertraline, valACYclovir, pantoprazole, albuterol, doxycycline, predniSONE, benzonatate, and ONE TOUCH ULTRA SYSTEM KIT. I am also having her start on escitalopram. Additionally, I am having her maintain her Multiple Vitamins-Minerals (CENTRUM SILVER PO), ALIGN, fenofibrate, losartan, glimepiride, atorvastatin, onetouch ultrasoft, glucose blood, ergocalciferol, and diazepam.  Meds ordered this encounter  Medications  . diazepam (VALIUM) 5 MG tablet    Sig: Take 0.5 to 1 tablet by mouth twice daily as needed for anxiety    Dispense:  40 tablet    Refill:  2  . escitalopram (LEXAPRO) 10 MG tablet    Sig: Take 1 tablet (10 mg total) by mouth daily.    Dispense:  30 tablet    Refill:  2     Penni Homans, MD

## 2015-12-03 NOTE — Assessment & Plan Note (Signed)
Tolerating statin, encouraged heart healthy diet, avoid trans fats, minimize simple carbs and saturated fats. Increase exercise as tolerated 

## 2015-12-03 NOTE — Progress Notes (Signed)
Pre visit review using our clinic review tool, if applicable. No additional management support is needed unless otherwise documented below in the visit note. 

## 2015-12-03 NOTE — Assessment & Plan Note (Signed)
Daughter's divorce is causin a great deal of stress she agrees to start Lexapro and can continue Valium prn

## 2015-12-03 NOTE — Assessment & Plan Note (Signed)
Encouraged to get adequate exercise, calcium and vitamin d intake 

## 2015-12-12 ENCOUNTER — Other Ambulatory Visit: Payer: Self-pay | Admitting: Family Medicine

## 2015-12-12 MED FILL — LOSARTAN POTASSIUM 25 MG TA: 25 | 90 days supply | Qty: 90 | Fill #0

## 2015-12-12 MED FILL — GLIMEPIRIDE 2 MG TABLET: 2 | 30 days supply | Qty: 60 | Fill #3

## 2015-12-12 MED FILL — FENOFIBRATE 160 MG TABLET: 160 | 90 days supply | Qty: 90 | Fill #0

## 2015-12-12 NOTE — Telephone Encounter (Signed)
Rx sent to the pharmacy by e-script.//AB/CMA 

## 2015-12-20 ENCOUNTER — Other Ambulatory Visit: Payer: Self-pay | Admitting: Family Medicine

## 2015-12-20 NOTE — Telephone Encounter (Signed)
Sent in 90 day supply of glimepiride as pt. Wanted. Did go over all meds. On her list, she just needed confirmation that what she had at home (and taking) was up to date with out list, which it was all correct.

## 2015-12-20 NOTE — Telephone Encounter (Signed)
°  Relationship to patient: Self Can be reached: [phone]   Reason for call: Patient request call back to discuss her medications. Not sure she is taking medications the way they should be taken or if she is taking the correct medications

## 2015-12-25 ENCOUNTER — Other Ambulatory Visit: Payer: Self-pay | Admitting: Family Medicine

## 2015-12-25 MED ORDER — ATORVASTATIN CALCIUM 80 MG PO TABS: ORAL_TABLET | ORAL | Status: DC

## 2015-12-31 ENCOUNTER — Other Ambulatory Visit: Payer: Self-pay | Admitting: Family Medicine

## 2015-12-31 MED ORDER — ATORVASTATIN CALCIUM 80 MG PO TABS: ORAL_TABLET | ORAL | Status: DC

## 2015-12-31 MED FILL — ATORVASTATIN 80 MG TABLET: 80 | 90 days supply | Qty: 90 | Fill #0

## 2016-01-17 ENCOUNTER — Telehealth: Payer: Self-pay | Admitting: Family Medicine

## 2016-01-17 MED ORDER — HYDROCODONE-ACETAMINOPHEN 5-325 MG PO TABS: 1.0000 | ORAL_TABLET | Freq: Two times a day (BID) | ORAL | Status: DC | PRN

## 2016-01-17 MED FILL — HYDROCODON-APAP 5-325: 5-325 | 10 days supply | Qty: 20 | Fill #0

## 2016-01-17 NOTE — Telephone Encounter (Signed)
OK to give her #20 of Hydrocodone 5/325 1 tab po bid prn severe pain, also try Lidocaine patches and moist heat, come in if no improvement

## 2016-01-17 NOTE — Telephone Encounter (Signed)
Patient reported back pain for the past 3 days.  She carried her dog and since then has had back pain and did some transplanting of plants. PCP had given her a small amount of hydrocodone 5 325.  She thinks she has just over done it with the dog/yard work and should get better.

## 2016-01-17 NOTE — Telephone Encounter (Signed)
Called the patient left a message that need more clarification on where pain is and for how long and name of medication requesting.

## 2016-01-17 NOTE — Telephone Encounter (Signed)
°  Relation to [id number] Call back number:[phone] Pharmacy: [place], [place] [phone] (Phone) [phone] (Fax)         Reason for call:  Patient states she experiencing back pain and requesting Rx. Patient states PCP has prescribed medication before

## 2016-01-17 NOTE — Telephone Encounter (Signed)
Printed prescription.  Called the patient informed of PCP instructions.  The patient agreed to all.

## 2016-01-23 MED FILL — GLIMEPIRIDE 2 MG TABLET: 2 | 90 days supply | Qty: 180 | Fill #0

## 2016-02-15 ENCOUNTER — Telehealth: Payer: Self-pay | Admitting: Internal Medicine

## 2016-02-15 NOTE — Telephone Encounter (Signed)
Left message for patient to call back  

## 2016-02-15 NOTE — Telephone Encounter (Signed)
Patient reports several year history of chest pain.  She reports that she will have chest pain accompanied with SOB.  She feels it is related to gas and is sometimes relieved with Gas-x.  She states pain is usually after a meal.  She does not take anything for reflux.  She will try an omeprazole OTC for a few weeks.  If this does not relieve her symptoms she will call me back to see an APP.  I did schedule her an office visit with Dr. Carlean Purl for 04/30/16.  Patient instructed to maintain an anti-reflux diet. Advised to avoid caffeine, mint, citrus foods/juices, tomatoes,  chocolate, NSAIDS/ASA products.  Instructed not to eat within 2 hours of exercise or bed, multiple small meals are better than 3 large meals.  Need to take PPI 30 minutes prior to 1st meal of the day.

## 2016-02-22 ENCOUNTER — Ambulatory Visit: Payer: Commercial Managed Care - HMO | Admitting: Internal Medicine

## 2016-02-28 ENCOUNTER — Other Ambulatory Visit (INDEPENDENT_AMBULATORY_CARE_PROVIDER_SITE_OTHER): Payer: Commercial Managed Care - HMO

## 2016-02-28 DIAGNOSIS — E785 Hyperlipidemia, unspecified: Secondary | ICD-10-CM | POA: Diagnosis not present

## 2016-02-28 DIAGNOSIS — I10 Essential (primary) hypertension: Secondary | ICD-10-CM | POA: Diagnosis not present

## 2016-02-28 DIAGNOSIS — E038 Other specified hypothyroidism: Secondary | ICD-10-CM | POA: Diagnosis not present

## 2016-02-28 DIAGNOSIS — E039 Hypothyroidism, unspecified: Secondary | ICD-10-CM

## 2016-02-28 DIAGNOSIS — M8580 Other specified disorders of bone density and structure, unspecified site: Secondary | ICD-10-CM | POA: Diagnosis not present

## 2016-02-28 DIAGNOSIS — K760 Fatty (change of) liver, not elsewhere classified: Secondary | ICD-10-CM

## 2016-02-28 DIAGNOSIS — E1149 Type 2 diabetes mellitus with other diabetic neurological complication: Secondary | ICD-10-CM

## 2016-02-28 LAB — COMPREHENSIVE METABOLIC PANEL
ALBUMIN: 4.4 g/dL (ref 3.5–5.2)
ALK PHOS: 52 U/L (ref 39–117)
ALT: 23 U/L (ref 0–35)
AST: 18 U/L (ref 0–37)
BILIRUBIN TOTAL: 0.6 mg/dL (ref 0.2–1.2)
BUN: 15 mg/dL (ref 6–23)
CO2: 26 mEq/L (ref 19–32)
Calcium: 10.2 mg/dL (ref 8.4–10.5)
Chloride: 104 mEq/L (ref 96–112)
Creatinine, Ser: 0.79 mg/dL (ref 0.40–1.20)
GFR: 77.19 mL/min (ref >60.00)
Glucose, Bld: 137 mg/dL — ABNORMAL HIGH (ref 70–99)
POTASSIUM: 4.2 meq/L (ref 3.5–5.1)
SODIUM: 139 meq/L (ref 135–145)
TOTAL PROTEIN: 7.5 g/dL (ref 6.0–8.3)

## 2016-02-28 LAB — CBC
HCT: 40.4 % (ref 36.0–46.0)
HEMOGLOBIN: 13.6 g/dL (ref 12.0–15.0)
MCHC: 33.8 g/dL (ref 30.0–36.0)
MCV: 91.3 fl (ref 78.0–100.0)
PLATELETS: 285 10*3/uL (ref 150.0–400.0)
RBC: 4.42 Mil/uL (ref 3.87–5.11)
RDW: 13.8 % (ref 11.5–15.5)
WBC: 6.3 10*3/uL (ref 4.0–10.5)

## 2016-02-28 LAB — LIPID PANEL
CHOLESTEROL: 166 mg/dL (ref 0–200)
HDL: 53 mg/dL (ref >39.00)
LDL CALC: 95 mg/dL (ref 0–99)
NONHDL: 112.71
Total CHOL/HDL Ratio: 3
Triglycerides: 89 mg/dL (ref 0.0–149.0)
VLDL: 17.8 mg/dL (ref 0.0–40.0)

## 2016-02-28 LAB — TSH: TSH: 5.92 u[IU]/mL — ABNORMAL HIGH (ref 0.35–4.50)

## 2016-02-28 LAB — HEMOGLOBIN A1C: Hgb A1c MFr Bld: 6.9 % — ABNORMAL HIGH (ref 4.6–6.5)

## 2016-02-29 ENCOUNTER — Other Ambulatory Visit: Payer: Self-pay | Admitting: Family Medicine

## 2016-02-29 DIAGNOSIS — E039 Hypothyroidism, unspecified: Secondary | ICD-10-CM

## 2016-02-29 MED ORDER — LEVOTHYROXINE SODIUM 25 MCG PO TABS: 25.0000 ug | ORAL_TABLET | Freq: Every day | ORAL | 5 refills | Status: DC

## 2016-02-29 MED FILL — LEVOTHYROXINE 25 MCG TABLET: 25 | 30 days supply | Qty: 30 | Fill #0

## 2016-03-04 ENCOUNTER — Encounter: Payer: Self-pay | Admitting: Family Medicine

## 2016-03-04 ENCOUNTER — Ambulatory Visit (INDEPENDENT_AMBULATORY_CARE_PROVIDER_SITE_OTHER): Payer: Commercial Managed Care - HMO | Admitting: Family Medicine

## 2016-03-04 VITALS — BP 110/62 | HR 85 | Temp 97.8°F | Resp 16 | Ht 75.0 in | Wt 149.6 lb

## 2016-03-04 DIAGNOSIS — E785 Hyperlipidemia, unspecified: Secondary | ICD-10-CM | POA: Diagnosis not present

## 2016-03-04 DIAGNOSIS — Z1239 Encounter for other screening for malignant neoplasm of breast: Secondary | ICD-10-CM | POA: Diagnosis not present

## 2016-03-04 DIAGNOSIS — M8580 Other specified disorders of bone density and structure, unspecified site: Secondary | ICD-10-CM | POA: Diagnosis not present

## 2016-03-04 DIAGNOSIS — E1149 Type 2 diabetes mellitus with other diabetic neurological complication: Secondary | ICD-10-CM

## 2016-03-04 DIAGNOSIS — E039 Hypothyroidism, unspecified: Secondary | ICD-10-CM

## 2016-03-04 DIAGNOSIS — E559 Vitamin D deficiency, unspecified: Secondary | ICD-10-CM

## 2016-03-04 DIAGNOSIS — I10 Essential (primary) hypertension: Secondary | ICD-10-CM | POA: Diagnosis not present

## 2016-03-04 HISTORY — DX: Vitamin D deficiency, unspecified: E55.9

## 2016-03-04 NOTE — Patient Instructions (Signed)

## 2016-03-04 NOTE — Progress Notes (Signed)
Pre visit review using our clinic review tool, if applicable. No additional management support is needed unless otherwise documented below in the visit note. 

## 2016-03-04 NOTE — Assessment & Plan Note (Signed)
Tolerating levothyroxine. Will recheck TSH prior to next visit

## 2016-03-04 NOTE — Progress Notes (Signed)
Patient ID: Crystal [name], female   DOB: 25-Apr-1951, 65 y.o.   MRN: [id number]   Subjective:    Patient ID: Crystal [name], female    DOB: 1951/03/21, 65 y.o.   MRN: [id number]  Chief Complaint  Patient presents with  . Follow-up    HPI Patient is in today for follow up. She feels well today. Continues to struggle with anxiety but no new recent illness or hospitalizations. Denies CP/palp/SOB/HA/congestion/fevers/GI or GU c/o. Taking meds as prescribed  Past Medical History:  Diagnosis Date  . Acute peptic ulcer of stomach    As a teenager  . Allergy    dust, cock roach,grass,weeds,molds  . Anxiety   . Asthma    02/08/10 FEV1 1.98 (80%), s/p saba 2.22 l/m (90%).nml  . Cataract   . Depression   . Diabetes mellitus 2003   Type II  . Diverticulosis   . Dyspepsia   . Fatty liver 12/19/2010  . GERD (gastroesophageal reflux disease)   . High cholesterol   . Hyperlipemia   . Hypertension   . Hypothyroidism 09/08/2013  . IBS (irritable bowel syndrome)   . Internal hemorrhoids   . OSA (obstructive sleep apnea)   . Plantar fasciitis   . Vitamin D deficiency 03/04/2016    Past Surgical History:  Procedure Laterality Date  . APPENDECTOMY     age 80  . CATARACT EXTRACTION, BILATERAL     12/8 and 12/14  . COLONOSCOPY  06/03/2010, 08/25/2011   diverticulosis   . GASTRECTOMY     partial - ulcer as a teen  . HAND SURGERY Right   . KNEE ARTHROSCOPY Left 1998  . NASAL SEPTUM SURGERY    . RHINOPLASTY    . TOE SURGERY Right 1995    Family History  Problem Relation Age of Onset  . Diabetes Mother     type II  . Heart attack Mother 91  . Ulcers      bleeding gastric  . Heart defect      child  . Other      perforated bowel, child  . Colon cancer Sister   . Cancer Sister     colon cancer  . Asthma Maternal Grandmother   . Mental retardation Son     PTSD  . Cancer Sister     stage 4 bladder cancer    Social History   Social History  . Marital status:  Married    Spouse name: [name]  . Number of children: 4  . Years of education: N/A   Occupational History  . 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 Smoking status: Never Smoker  . Smokeless tobacco: Never Used  . Alcohol use 0.0 oz/week     Comment: Rarely  . Drug use: No  . Sexual activity: No     Comment: lives with her mother, no dietary restrictions   Other Topics Concern  . Not on file   Social History Narrative   No caffeine drinks daily     Outpatient Medications Prior to Visit  Medication Sig Dispense Refill  . atorvastatin (LIPITOR) 80 MG tablet TAKE 1 TABLET (80 MG) BY MOUTH DAILY. 90 tablet 2  . diazepam (VALIUM) 5 MG tablet Take 0.5 to 1 tablet by mouth twice daily as needed for anxiety 40 tablet 2  . escitalopram (LEXAPRO) 10 MG tablet Take 1 tablet (10 mg total) by mouth daily. 30 tablet 2  . fenofibrate 160 MG tablet TAKE 1  TABLET (160 MG TOTAL) BY MOUTH AT BEDTIME. 90 tablet 2  . glimepiride (AMARYL) 2 MG tablet TAKE 1 TABLET (2 MG TOTAL) BY MOUTH 2 (TWO) TIMES DAILY. 180 tablet 1  . glucose blood test strip USE AS DIRECTED TO CHECK BLOOD SUGAR 2 TIMES A DAY Dx: E11.49 100 each 12  . HYDROcodone-acetaminophen (NORCO/VICODIN) 5-325 MG tablet Take 1 tablet by mouth 2 (two) times daily as needed for severe pain. 20 tablet 0  . Lancets (ONETOUCH ULTRASOFT) lancets USE AS DIRECTED TO CHECK BLOOD SUGAR 2 TIMES A DAY Dx: 11.49 100 each 12  . levothyroxine (SYNTHROID, LEVOTHROID) 25 MCG tablet Take 1 tablet (25 mcg total) by mouth daily before breakfast. 30 tablet 5  . losartan (COZAAR) 25 MG tablet TAKE 1 TABLET (25 MG TOTAL) BY MOUTH DAILY. 90 tablet 2  . Multiple Vitamins-Minerals (CENTRUM SILVER PO) Take 1 tablet by mouth daily.      . Probiotic Product (ALIGN) 4 MG CAPS Take 1 capsule by mouth daily. Reported on 07/02/2015    . ergocalciferol (VITAMIN D2) 50000 units capsule Take 1 capsule (50,000 Units total) by mouth once a week. 12 capsule 0   No  facility-administered medications prior to visit.     Allergies  Allergen Reactions  . Compazine [Prochlorperazine Edisylate]   . Prochlorperazine Edisylate Other (See Comments)    coma for 3 days  . Tetanus Toxoid Other (See Comments)    convulsions    Review of Systems  Constitutional: Negative for fever and malaise/fatigue.  HENT: Negative for congestion.   Eyes: Negative for blurred vision.  Respiratory: Negative for shortness of breath.   Cardiovascular: Negative for chest pain, palpitations and leg swelling.  Gastrointestinal: Negative for abdominal pain, blood in stool and nausea.  Genitourinary: Negative for dysuria and frequency.  Musculoskeletal: Negative for falls.  Skin: Negative for rash.  Neurological: Negative for dizziness, loss of consciousness and headaches.  Endo/Heme/Allergies: Negative for environmental allergies.  Psychiatric/Behavioral: Negative for depression. The patient is nervous/anxious.        Objective:    Physical Exam  Constitutional: She is oriented to person, place, and time. She appears well-developed and well-nourished. No distress.  HENT:  Head: Normocephalic and atraumatic.  Nose: Nose normal.  Eyes: Right eye exhibits no discharge. Left eye exhibits no discharge.  Neck: Normal range of motion. Neck supple.  Cardiovascular: Normal rate and regular rhythm.   No murmur heard. Pulmonary/Chest: Effort normal and breath sounds normal.  Abdominal: Soft. Bowel sounds are normal. There is no tenderness.  Musculoskeletal: She exhibits no edema.  Neurological: She is alert and oriented to person, place, and time.  Skin: Skin is warm and dry.  Psychiatric: She has a normal mood and affect.  Nursing note and vitals reviewed.   BP 110/62 (BP Location: Left Arm, Patient Position: Sitting, Cuff Size: Normal)   Pulse 85   Temp 97.8 F (36.6 C) (Oral)   Resp 16   Ht 6\' 3"  (1.905 m)   Wt 149 lb 9.6 oz (67.9 kg)   SpO2 95%   BMI 18.70 kg/m    Wt Readings from Last 3 Encounters:  03/04/16 149 lb 9.6 oz (67.9 kg)  12/03/15 150 lb 4 oz (68.2 kg)  09/05/15 149 lb 12.8 oz (67.9 kg)     Lab Results  Component Value Date   WBC 6.3 02/28/2016   HGB 13.6 02/28/2016   HCT 40.4 02/28/2016   PLT 285.0 02/28/2016   GLUCOSE 137 (H) 02/28/2016  CHOL 166 02/28/2016   TRIG 89.0 02/28/2016   HDL 53.00 02/28/2016   LDLDIRECT 123.0 09/18/2014   LDLCALC 95 02/28/2016   ALT 23 02/28/2016   AST 18 02/28/2016   NA 139 02/28/2016   K 4.2 02/28/2016   CL 104 02/28/2016   CREATININE 0.79 02/28/2016   BUN 15 02/28/2016   CO2 26 02/28/2016   TSH 5.92 (H) 02/28/2016   HGBA1C 6.9 (H) 02/28/2016   MICROALBUR 1.3 04/30/2015    Lab Results  Component Value Date   TSH 5.92 (H) 02/28/2016   Lab Results  Component Value Date   WBC 6.3 02/28/2016   HGB 13.6 02/28/2016   HCT 40.4 02/28/2016   MCV 91.3 02/28/2016   PLT 285.0 02/28/2016   Lab Results  Component Value Date   NA 139 02/28/2016   K 4.2 02/28/2016   CO2 26 02/28/2016   GLUCOSE 137 (H) 02/28/2016   BUN 15 02/28/2016   CREATININE 0.79 02/28/2016   BILITOT 0.6 02/28/2016   ALKPHOS 52 02/28/2016   AST 18 02/28/2016   ALT 23 02/28/2016   PROT 7.5 02/28/2016   ALBUMIN 4.4 02/28/2016   CALCIUM 10.2 02/28/2016   ANIONGAP 7 01/08/2015   GFR 77.19 02/28/2016   Lab Results  Component Value Date   CHOL 166 02/28/2016   Lab Results  Component Value Date   HDL 53.00 02/28/2016   Lab Results  Component Value Date   LDLCALC 95 02/28/2016   Lab Results  Component Value Date   TRIG 89.0 02/28/2016   Lab Results  Component Value Date   CHOLHDL 3 02/28/2016   Lab Results  Component Value Date   HGBA1C 6.9 (H) 02/28/2016       Assessment & Plan:   Problem List Items Addressed This Visit    DM (diabetes mellitus), type 2 with neurological complications ([facility])    [id number] acceptable, minimize simple carbs. Increase exercise as tolerated. Continue current meds       Relevant Orders   Comprehensive metabolic panel   Hemoglobin A1c   Hyperlipidemia    Tolerating statin, encouraged heart healthy diet, avoid trans fats, minimize simple carbs and saturated fats. Increase exercise as tolerated      Relevant Orders   Lipid panel   Essential hypertension    Well controlled, no changes to meds. Encouraged heart healthy diet such as the DASH diet and exercise as tolerated.       Relevant Orders   CBC   Hypothyroidism    Tolerating levothyroxine. Will recheck TSH prior to next visit      Relevant Orders   TSH   Osteopenia    Encouraged to get adequate exercise, calcium and vitamin d intake      Relevant Orders   VITAMIN D 25 Hydroxy (Vit-D Deficiency, Fractures)   Vitamin D deficiency   Relevant Orders   VITAMIN D 25 Hydroxy (Vit-D Deficiency, Fractures)    Other Visit Diagnoses    Breast cancer screening    -  Primary   Relevant Orders   MM Digital Screening      I am having Ms. Terance Hart maintain her Multiple Vitamins-Minerals (CENTRUM SILVER PO), ALIGN, onetouch ultrasoft, glucose blood, diazepam, escitalopram, fenofibrate, losartan, glimepiride, atorvastatin, HYDROcodone-acetaminophen, and levothyroxine.  No orders of the defined types were placed in this encounter.    Penni Homans, MD

## 2016-03-04 NOTE — Assessment & Plan Note (Signed)
Tolerating statin, encouraged heart healthy diet, avoid trans fats, minimize simple carbs and saturated fats. Increase exercise as tolerated 

## 2016-03-04 NOTE — Assessment & Plan Note (Signed)
Encouraged to get adequate exercise, calcium and vitamin d intake 

## 2016-03-04 NOTE — Assessment & Plan Note (Signed)
Well controlled, no changes to meds. Encouraged heart healthy diet such as the DASH diet and exercise as tolerated.  °

## 2016-03-04 NOTE — Assessment & Plan Note (Signed)
hgba1c acceptable, minimize simple carbs. Increase exercise as tolerated. Continue current meds 

## 2016-03-14 ENCOUNTER — Other Ambulatory Visit: Payer: Self-pay | Admitting: Family Medicine

## 2016-03-14 DIAGNOSIS — E119 Type 2 diabetes mellitus without complications: Secondary | ICD-10-CM | POA: Diagnosis not present

## 2016-03-14 DIAGNOSIS — Z961 Presence of intraocular lens: Secondary | ICD-10-CM | POA: Diagnosis not present

## 2016-03-14 DIAGNOSIS — H35033 Hypertensive retinopathy, bilateral: Secondary | ICD-10-CM | POA: Diagnosis not present

## 2016-03-14 MED FILL — VIT D2 1.25 MG (50,000 UNIT: 1.25 MG | 28 days supply | Qty: 4 | Fill #0

## 2016-03-20 ENCOUNTER — Ambulatory Visit (HOSPITAL_BASED_OUTPATIENT_CLINIC_OR_DEPARTMENT_OTHER): Payer: Commercial Managed Care - HMO

## 2016-04-07 ENCOUNTER — Other Ambulatory Visit: Payer: Self-pay | Admitting: Family Medicine

## 2016-04-07 MED FILL — FENOFIBRATE 160 MG TABLET: 160 | 90 days supply | Qty: 90 | Fill #1

## 2016-04-07 MED FILL — LOSARTAN POTASSIUM 25 MG TA: 25 | 90 days supply | Qty: 90 | Fill #1

## 2016-04-08 ENCOUNTER — Other Ambulatory Visit: Payer: Self-pay | Admitting: Family Medicine

## 2016-04-08 MED ORDER — LEVOTHYROXINE SODIUM 25 MCG PO TABS: 25.0000 ug | ORAL_TABLET | Freq: Every day | ORAL | 1 refills | Status: DC

## 2016-04-08 MED FILL — LEVOTHYROXINE 25 MCG TABLET: 25 | 90 days supply | Qty: 90 | Fill #0

## 2016-04-08 MED FILL — valACYclovir HCL 1 GM TABS: 1 | 30 days supply | Qty: 60 | Fill #0

## 2016-04-10 ENCOUNTER — Other Ambulatory Visit: Payer: Self-pay | Admitting: Family Medicine

## 2016-04-10 MED ORDER — ACCU-CHEK AVIVA PLUS W/DEVICE KIT: PACK | 0 refills | Status: DC

## 2016-04-10 MED ORDER — GLIMEPIRIDE 2 MG PO TABS: 2.0000 mg | ORAL_TABLET | Freq: Every day | ORAL | 2 refills | Status: DC

## 2016-04-10 MED ORDER — BD SWAB SINGLE USE REGULAR PADS: MEDICATED_PAD | 3 refills | Status: DC

## 2016-04-10 MED ORDER — LEVOTHYROXINE SODIUM 25 MCG PO TABS: 25.0000 ug | ORAL_TABLET | Freq: Every day | ORAL | 2 refills | Status: DC

## 2016-04-10 MED ORDER — ACCU-CHEK AVIVA VI SOLN: 3 refills | Status: DC

## 2016-04-10 MED ORDER — ACCU-CHEK SOFTCLIX LANCETS MISC: 3 refills | Status: DC

## 2016-04-10 MED ORDER — FENOFIBRATE 160 MG PO TABS: 160.0000 mg | ORAL_TABLET | Freq: Every day | ORAL | 2 refills | Status: DC

## 2016-04-10 MED ORDER — LOSARTAN POTASSIUM 25 MG PO TABS: 25.0000 mg | ORAL_TABLET | Freq: Every day | ORAL | 2 refills | Status: DC

## 2016-04-10 MED ORDER — ATORVASTATIN CALCIUM 80 MG PO TABS
ORAL_TABLET | ORAL | 2 refills | Status: DC
Start: 2016-04-10 — End: 2017-04-03

## 2016-04-10 MED ORDER — GLUCOSE BLOOD VI STRP: ORAL_STRIP | 3 refills | Status: DC

## 2016-04-14 ENCOUNTER — Ambulatory Visit (HOSPITAL_BASED_OUTPATIENT_CLINIC_OR_DEPARTMENT_OTHER)
Admission: RE | Admit: 2016-04-14 | Discharge: 2016-04-14 | Disposition: A | Discharge status: Home / Self Care | Payer: Commercial Managed Care - HMO | Source: Ambulatory Visit | Attending: Family Medicine | Admitting: Family Medicine

## 2016-04-14 DIAGNOSIS — Z1231 Encounter for screening mammogram for malignant neoplasm of breast: Secondary | ICD-10-CM | POA: Insufficient documentation

## 2016-04-14 DIAGNOSIS — Z1239 Encounter for other screening for malignant neoplasm of breast: Secondary | ICD-10-CM

## 2016-04-16 ENCOUNTER — Telehealth: Payer: Self-pay | Admitting: Family Medicine

## 2016-04-16 NOTE — Telephone Encounter (Signed)
Pharmacy is requesting a refill of Pantoprazole. Please advise.    Pharmacy Contact: [id number] Pharmacy: [facility]

## 2016-04-17 MED ORDER — PANTOPRAZOLE SODIUM 40 MG PO TBEC: 40.0000 mg | DELAYED_RELEASE_TABLET | Freq: Every day | ORAL | 1 refills | Status: DC

## 2016-04-17 NOTE — Telephone Encounter (Signed)
Is on her history list, please refill for 30 day with 5 rf or 90 with 1

## 2016-04-17 NOTE — Telephone Encounter (Signed)
Advise as to not see this medication on her list.

## 2016-04-17 NOTE — Telephone Encounter (Signed)
Sent in as requested to [facility].

## 2016-04-21 ENCOUNTER — Other Ambulatory Visit: Payer: Self-pay | Admitting: Family Medicine

## 2016-04-21 MED FILL — VIT D2 1.25 MG (50,000 UNIT: 1.25 MG | 28 days supply | Qty: 4 | Fill #0

## 2016-04-23 ENCOUNTER — Ambulatory Visit (INDEPENDENT_AMBULATORY_CARE_PROVIDER_SITE_OTHER): Payer: Commercial Managed Care - HMO

## 2016-04-23 DIAGNOSIS — Z23 Encounter for immunization: Secondary | ICD-10-CM

## 2016-04-30 ENCOUNTER — Encounter: Payer: Self-pay | Admitting: Internal Medicine

## 2016-04-30 ENCOUNTER — Ambulatory Visit (INDEPENDENT_AMBULATORY_CARE_PROVIDER_SITE_OTHER): Payer: Commercial Managed Care - HMO | Admitting: Internal Medicine

## 2016-04-30 VITALS — BP 126/78 | HR 88 | Ht 63.0 in | Wt 148.0 lb

## 2016-04-30 DIAGNOSIS — Z800 Family history of malignant neoplasm of digestive organs: Secondary | ICD-10-CM

## 2016-04-30 DIAGNOSIS — R151 Fecal smearing: Secondary | ICD-10-CM

## 2016-04-30 DIAGNOSIS — K580 Irritable bowel syndrome with diarrhea: Secondary | ICD-10-CM | POA: Diagnosis not present

## 2016-04-30 MED ORDER — NA SULFATE-K SULFATE-MG SULF 17.5-3.13-1.6 GM/177ML PO SOLN: 1.0000 | Freq: Once | ORAL | 0 refills | Status: AC

## 2016-04-30 MED ORDER — NA SULFATE-K SULFATE-MG SULF 17.5-3.13-1.6 GM/177ML PO SOLN: 1.0000 | Freq: Once | ORAL | 0 refills | Status: DC

## 2016-04-30 MED FILL — SUPREP BOWEL PREP KIT: 17.5-3.13-1 | 354 days supply | Qty: 354 | Fill #0

## 2016-04-30 NOTE — Progress Notes (Signed)
Crystal [name] 65 y.o. 14-May-1951 [id number]  Assessment & Plan:   Encounter Diagnoses  Name Primary?  . Fecal smearing Yes  . Family history of colon cancer   . Irritable bowel syndrome with diarrhea     Go ahead and schedule colonoscopy - was due in about 6 mos routine but w/ new(er) fecal leakage will check things now. Suspect hemorrhoids as cause but colonoscopy now will reassure her as she has concerns about risks of colon cancer in setting of Fhx.  If hemorrhoids confirmed as cause I think banding may be good option.  Fortunately IBS seems controlled at this time.  The risks and benefits as well as alternatives of endoscopic procedure(s) have been discussed and reviewed. All questions answered. The patient agrees to proceed.   Subjective:   Chief Complaint: fecal leakage  HPI IBS seems to be "ok" - some loose stools at times but overall regular defecation without problems like in the past. She has been soiling underwear with small amounts of soft feces and notices this especially when she walks and is awake, moving around. Wearing a pad. No bleeding.  Concerned about implications of her FHx. 1 sister colon cancer - in 69's Another bladder cancer Another sister benign colon polyps  Allergies  Allergen Reactions  . Compazine [Prochlorperazine Edisylate]     Comma for 3 days  . Tetanus Toxoid Other (See Comments)    convulsions   Outpatient Medications Prior to Visit  Medication Sig Dispense Refill  . ACCU-CHEK SOFTCLIX LANCETS lancets Use as directed twice daily to check blood sugar. DX E11.9 200 each 3  . Alcohol Swabs (B-D SINGLE USE SWABS REGULAR) PADS Use as directed to check blood sugar. DX E11.9 100 each 3  . atorvastatin (LIPITOR) 80 MG tablet TAKE 1 TABLET (80 MG) BY MOUTH DAILY. 90 tablet 2  . Blood Glucose Calibration (ACCU-CHEK AVIVA) SOLN Use as directed to check blood sugar.  DX E11.9 1 each 3  . Blood Glucose Monitoring Suppl (ACCU-CHEK  AVIVA PLUS) w/Device KIT Use as directed to check blood sugar.  DX E11.9 1 kit 0  . diazepam (VALIUM) 5 MG tablet Take 0.5 to 1 tablet by mouth twice daily as needed for anxiety 40 tablet 2  . escitalopram (LEXAPRO) 10 MG tablet Take 1 tablet (10 mg total) by mouth daily. 30 tablet 2  . fenofibrate 160 MG tablet Take 1 tablet (160 mg total) by mouth daily. (Patient taking differently: Take 160 mg by mouth at bedtime. ) 90 tablet 2  . glucose blood (ACCU-CHEK AVIVA PLUS) test strip Use as directed twice daily to check blood sugar.  DX E11.9 200 each 3  . levothyroxine (SYNTHROID, LEVOTHROID) 25 MCG tablet Take 1 tablet (25 mcg total) by mouth daily before breakfast. 90 tablet 2  . losartan (COZAAR) 25 MG tablet Take 1 tablet (25 mg total) by mouth daily. 90 tablet 2  . Multiple Vitamins-Minerals (CENTRUM SILVER PO) Take 1 tablet by mouth daily.      . pantoprazole (PROTONIX) 40 MG tablet Take 1 tablet (40 mg total) by mouth daily. 90 tablet 1  . Probiotic Product (ALIGN) 4 MG CAPS Take 1 capsule by mouth daily. Reported on 07/02/2015    . valACYclovir (VALTREX) 1000 MG tablet TAKE 1 TABLET (1,000 MG) BY MOUTH 2 TIMES A DAY 60 tablet 0  . Vitamin D, Ergocalciferol, (DRISDOL) 50000 units CAPS capsule TAKE 1 CAPSULE (50,000 UNITS TOTAL) BY MOUTH ONCE A WEEK. 4 capsule 0  .  glimepiride (AMARYL) 2 MG tablet Take 1 tablet (2 mg total) by mouth daily with breakfast. (Patient taking differently: Take 2 mg by mouth 2 (two) times daily. ) 180 tablet 2  . HYDROcodone-acetaminophen (NORCO/VICODIN) 5-325 MG tablet Take 1 tablet by mouth 2 (two) times daily as needed for severe pain. 20 tablet 0   No facility-administered medications prior to visit.    Past Medical History:  Diagnosis Date  . Acute peptic ulcer of stomach    As a teenager  . Allergy    dust, cock roach,grass,weeds,molds  . Anxiety   . Asthma    02/08/10 FEV1 1.98 (80%), s/p saba 2.22 l/m (90%).nml  . Cataract   . Depression   . 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 Diverticulosis   . Dyspepsia   . Fatty liver 12/19/2010  . GERD (gastroesophageal reflux disease)   . High cholesterol   . Hyperlipemia   . Hypertension   . Hypothyroidism 09/08/2013  . IBS (irritable bowel syndrome)   . Internal hemorrhoids   . OSA (obstructive sleep apnea)   . Plantar fasciitis   . Vitamin D deficiency 03/04/2016   Past Surgical History:  Procedure Laterality Date  . APPENDECTOMY     age 21  . CATARACT EXTRACTION, BILATERAL     12/8 and 12/14  . COLONOSCOPY  06/03/2010, 08/25/2011   diverticulosis   . GASTRECTOMY     partial - ulcer as a teen  . HAND SURGERY Right   . KNEE ARTHROSCOPY Left 1998  . NASAL SEPTUM SURGERY    . RHINOPLASTY    . TOE SURGERY Right 1995   Social History   Social History  . Marital status: Married    Spouse name: [name]  . Number of children: 4  . Years of education: N/A   Occupational History  . 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 Smoking status: Never Smoker  . Smokeless tobacco: Never Used  . Alcohol use 0.0 oz/week     Comment: Rarely  . Drug use: No  . Sexual activity: No     Comment: lives with her mother, no dietary restrictions   Other Topics Concern  . None   Social History Narrative   No caffeine drinks daily    Family History  Problem Relation Age of Onset  . Diabetes Mother     type II  . Heart attack Mother 17  . Colon cancer Sister   . Asthma Maternal Grandmother   . Mental retardation Son     PTSD  . Cancer Sister     stage 4 bladder cancer  . Colon polyps Sister   . Ulcers      bleeding gastric  . Heart defect      child  . Other      perforated bowel, child    Review of Systems As above  Objective:   Physical Exam '@BP'  126/78 (BP Location: Left Arm, Patient Position: Sitting, Cuff Size: Normal)   Pulse 88   Ht '5\' 3"'  (1.6 m) @  General:  NAD Eyes:   anicteric Lungs:  clear Heart::  S1S2 no rubs, murmurs or gallops Abdomen:  soft and  nontender, BS+, surgical scars Ext:   no edema, cyanosis or clubbing    Data Reviewed:  Prior colonoscopy 2013 - in EMR Lab Results  Component Value Date   WBC 6.3 02/28/2016   HGB 13.6 02/28/2016   HCT 40.4 02/28/2016  MCV 91.3 02/28/2016   PLT 285.0 02/28/2016

## 2016-04-30 NOTE — Patient Instructions (Signed)
You have been scheduled for a colonoscopy. Please follow written instructions given to you at your visit today.  Please pick up your prep supplies at the pharmacy. If you use inhalers (even only as needed), please bring them with you on the day of your procedure.   I appreciate the opportunity to care for you. Carl Gessner, MD, FACG 

## 2016-05-12 ENCOUNTER — Telehealth: Payer: Self-pay | Admitting: Internal Medicine

## 2016-05-12 NOTE — Telephone Encounter (Signed)
Patient notified she needs to have colonoscopy 1st.  I helped her reschedule to 05/19/16 2:00

## 2016-05-13 ENCOUNTER — Other Ambulatory Visit: Payer: Self-pay | Admitting: Physician Assistant

## 2016-05-13 DIAGNOSIS — J202 Acute bronchitis due to streptococcus: Secondary | ICD-10-CM

## 2016-05-13 MED FILL — VENTOLIN HFA 90 MCG INHALER: 108 (90 BAS | 17 days supply | Qty: 18 | Fill #0

## 2016-05-13 NOTE — Telephone Encounter (Signed)
Rx request to pharmacy/SLS  

## 2016-05-15 ENCOUNTER — Telehealth: Payer: Self-pay | Admitting: Family Medicine

## 2016-05-15 ENCOUNTER — Encounter: Payer: Self-pay | Admitting: Internal Medicine

## 2016-05-15 ENCOUNTER — Emergency Department (INDEPENDENT_AMBULATORY_CARE_PROVIDER_SITE_OTHER): Payer: Commercial Managed Care - HMO

## 2016-05-15 ENCOUNTER — Emergency Department
Admission: EM | Admit: 2016-05-15 | Discharge: 2016-05-15 | Disposition: A | Discharge status: Home / Self Care | Payer: Commercial Managed Care - HMO | Source: Home / Self Care | Attending: Family Medicine | Admitting: Family Medicine

## 2016-05-15 ENCOUNTER — Encounter: Payer: Self-pay | Admitting: *Deleted

## 2016-05-15 DIAGNOSIS — M545 Low back pain, unspecified: Secondary | ICD-10-CM

## 2016-05-15 DIAGNOSIS — M419 Scoliosis, unspecified: Secondary | ICD-10-CM | POA: Diagnosis not present

## 2016-05-15 DIAGNOSIS — S3992XA Unspecified injury of lower back, initial encounter: Secondary | ICD-10-CM | POA: Diagnosis not present

## 2016-05-15 MED ORDER — IBUPROFEN 600 MG PO TABS
600.0000 mg | ORAL_TABLET | Freq: Once | ORAL | Status: AC
  Administered 2016-05-15: 600 mg via ORAL

## 2016-05-15 MED ORDER — HYDROCODONE-ACETAMINOPHEN 5-325 MG PO TABS: ORAL_TABLET | ORAL | 0 refills | Status: DC

## 2016-05-15 MED FILL — HYDROCODON-APAP 5-325: 5-325 | 10 days supply | Qty: 10 | Fill #0

## 2016-05-15 NOTE — Discharge Instructions (Signed)
Apply ice pack for 20 to 30 minutes, 3 to 4 times daily  Continue until pain decreases. May take Tylenol as needed for pain daytime.  Begin range of motion and stretching exercises as tolerated.
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
## 2016-05-15 NOTE — Telephone Encounter (Signed)
Was not able to review phone calls til after hours and so was unable to work her in please see how she is doing and see if she needs to see someone else.
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
## 2016-05-16 NOTE — Telephone Encounter (Signed)
Patient informed of PCP instructions.  She has been seen by Urgent care in [place] and doing ok.

## 2016-05-19 ENCOUNTER — Ambulatory Visit (AMBULATORY_SURGERY_CENTER): Payer: Commercial Managed Care - HMO | Admitting: Internal Medicine

## 2016-05-19 ENCOUNTER — Encounter: Payer: Self-pay | Admitting: Internal Medicine

## 2016-05-19 VITALS — BP 137/76 | HR 59 | Temp 97.8°F | Resp 17 | Ht 63.0 in | Wt 148.0 lb

## 2016-05-19 DIAGNOSIS — Z1211 Encounter for screening for malignant neoplasm of colon: Secondary | ICD-10-CM | POA: Diagnosis not present

## 2016-05-19 DIAGNOSIS — G4730 Sleep apnea, unspecified: Secondary | ICD-10-CM | POA: Diagnosis not present

## 2016-05-19 DIAGNOSIS — Z1212 Encounter for screening for malignant neoplasm of rectum: Secondary | ICD-10-CM | POA: Diagnosis not present

## 2016-05-19 DIAGNOSIS — E119 Type 2 diabetes mellitus without complications: Secondary | ICD-10-CM | POA: Diagnosis not present

## 2016-05-19 DIAGNOSIS — Z800 Family history of malignant neoplasm of digestive organs: Secondary | ICD-10-CM | POA: Diagnosis present

## 2016-05-19 MED ORDER — SODIUM CHLORIDE 0.9 % IV SOLN: 500.0000 mL | INTRAVENOUS | Status: DC

## 2016-05-19 NOTE — Patient Instructions (Addendum)
No polyps or cancer.  Hemorrhoids and diverticulosis seen.  We will set up hemorrhoid banding which should help your fecal smearing problem.  Next routine colonoscopy in 5 years - 2022  I appreciate the opportunity to care for you. Gatha Mayer, MD, FACG        YOU HAD AN ENDOSCOPIC PROCEDURE TODAY AT [facility]:   Refer to the procedure report that was given to you for any specific questions about what was found during the examination.  If the procedure report does not answer your questions, please call your gastroenterologist to clarify.  If you requested that your care partner not be given the details of your procedure findings, then the procedure report has been included in a sealed envelope for you to review at your convenience later.  YOU SHOULD EXPECT: Some feelings of bloating in the abdomen. Passage of more gas than usual.  Walking can help get rid of the air that was put into your GI tract during the procedure and reduce the bloating. If you had a lower endoscopy (such as a colonoscopy or flexible sigmoidoscopy) you may notice spotting of blood in your stool or on the toilet paper. If you underwent a bowel prep for your procedure, you may not have a normal bowel movement for a few days.  Please Note:  You might notice some irritation and congestion in your nose or some drainage.  This is from the oxygen used during your procedure.  There is no need for concern and it should clear up in a day or so.  SYMPTOMS TO REPORT IMMEDIATELY:   Following lower endoscopy (colonoscopy or flexible sigmoidoscopy):  Excessive amounts of blood in the stool  Significant tenderness or worsening of abdominal pains  Swelling of the abdomen that is new, acute  Fever of 100F or higher    For urgent or emergent issues, a gastroenterologist can be reached at any hour by calling [phone].   DIET:  We do recommend a small meal at first, but then you may proceed  to your regular diet.  Drink plenty of fluids but you should avoid alcoholic beverages for 24 hours.  ACTIVITY:  You should plan to take it easy for the rest of today and you should NOT DRIVE or use heavy machinery until tomorrow (because of the sedation medicines used during the test).    FOLLOW UP: Our staff will call the number listed on your records the next business day following your procedure to check on you and address any questions or concerns that you may have regarding the information given to you following your procedure. If we do not reach you, we will leave a message.  However, if you are feeling well and you are not experiencing any problems, there is no need to return our call.  We will assume that you have returned to your regular daily activities without incident.  If any biopsies were taken you will be contacted by phone or by letter within the next 1-3 weeks.  Please call us at [phone] if you have not heard about the biopsies in 3 weeks.    SIGNATURES/CONFIDENTIALITY: You and/or your care partner have signed paperwork which will be entered into your electronic medical record.  These signatures attest to the fact that that the information above on your After Visit Summary has been reviewed and is understood.  Full responsibility of the confidentiality of this discharge information lies with you and/or your care-partner.  Resume medications. Information given on diverticulosis and hemorrhoids.

## 2016-05-19 NOTE — Progress Notes (Signed)
A/ox3, pleased with MAC, report to RN 

## 2016-05-19 NOTE — Op Note (Signed)
[facility] Patient Name: Crystal [name] Procedure Date: 05/19/2016 1:53 PM MRN: [id number] Endoscopist: Gatha Mayer , MD Age: 65 Referring MD:  Date of Birth: 04-13-1951 Gender: Female Account #: [id number] Procedure:                Colonoscopy Indications:              Screening in patient at increased risk: Colorectal                            cancer in sister before age 70 Medicines:                Propofol per Anesthesia, Monitored Anesthesia Care Procedure:                Pre-Anesthesia Assessment:                           - Prior to the procedure, a History and Physical                            was performed, and patient medications and                            allergies were reviewed. The patient's tolerance of                            previous anesthesia was also reviewed. The risks                            and benefits of the procedure and the sedation                            options and risks were discussed with the patient.                            All questions were answered, and informed consent                            was obtained. Prior Anticoagulants: The patient has                            taken no previous anticoagulant or antiplatelet                            agents. ASA Grade Assessment: II - A patient with                            mild systemic disease. After reviewing the risks                            and benefits, the patient was deemed in                            satisfactory condition to undergo the procedure.  After obtaining informed consent, the colonoscope                            was passed under direct vision. Throughout the                            procedure, the patient's blood pressure, pulse, and                            oxygen saturations were monitored continuously. The                            Model CF-HQ190L [id number]) scope was introduced   through the anus and advanced to the the cecum,                            identified by appendiceal orifice and ileocecal                            valve. The colonoscopy was performed without                            difficulty. The patient tolerated the procedure                            well. The quality of the bowel preparation was                            good. The bowel preparation used was Miralax. The                            ileocecal valve, appendiceal orifice, and rectum                            were photographed. Scope In: 1:58:40 PM Scope Out: 2:09:57 PM Scope Withdrawal Time: 0 hours 9 minutes 5 seconds  Total Procedure Duration: 0 hours 11 minutes 17 seconds  Findings:                 The perianal and digital rectal examinations were                            normal.                           Multiple diverticula were found in the left colon.                           Scattered diverticula were found in the right colon.                           Internal hemorrhoids were found during                            retroflexion. The hemorrhoids were Grade II                            (  internal hemorrhoids that prolapse but reduce                            spontaneously).                           The exam was otherwise without abnormality on                            direct and retroflexion views. Complications:            No immediate complications. Estimated Blood Loss:     Estimated blood loss: none. Impression:               - Diverticulosis in the left colon.                           - Diverticulosis in the right colon.                           - Internal hemorrhoids.                           - The examination was otherwise normal on direct                            and retroflexion views.                           - No specimens collected. Recommendation:           - Patient has a contact number available for                            emergencies.  The signs and symptoms of potential                            delayed complications were discussed with the                            patient. Return to normal activities tomorrow.                            Written discharge instructions were provided to the                            patient.                           - Repeat colonoscopy in 5 years for screening                            purposes.                           -                           MY OFFICE WILL  SCHEDULE HEMORRHOID BANDING                           - Resume previous diet.                           - Continue present medications. Gatha Mayer, MD 05/19/2016 2:23:02 PM This report has been signed electronically.

## 2016-05-20 ENCOUNTER — Telehealth: Payer: Self-pay

## 2016-05-20 NOTE — Telephone Encounter (Signed)
  Follow up Call-  Call back number 05/19/2016  Post procedure Call Back phone  # [phone]  Permission to leave phone message Yes  Some recent data might be hidden     Patient questions:  Do you have a fever, pain , or abdominal swelling? No. Pain Score  0 *  Have you tolerated food without any problems? Yes.    Have you been able to return to your normal activities? Yes.    Do you have any questions about your discharge instructions: Diet   No. Medications  No. Follow up visit  No.  Do you have questions or concerns about your Care? No.  Actions: * If pain score is 4 or above: No action needed, pain <4.

## 2016-05-22 ENCOUNTER — Other Ambulatory Visit: Payer: Self-pay | Admitting: Family Medicine

## 2016-05-22 MED ORDER — ESCITALOPRAM OXALATE 10 MG PO TABS: 10.0000 mg | ORAL_TABLET | Freq: Every day | ORAL | 2 refills | Status: DC

## 2016-05-30 ENCOUNTER — Other Ambulatory Visit: Payer: Commercial Managed Care - HMO

## 2016-06-02 ENCOUNTER — Telehealth: Payer: Self-pay | Admitting: Family Medicine

## 2016-06-02 ENCOUNTER — Other Ambulatory Visit (INDEPENDENT_AMBULATORY_CARE_PROVIDER_SITE_OTHER): Payer: Commercial Managed Care - HMO

## 2016-06-02 ENCOUNTER — Encounter: Payer: Commercial Managed Care - HMO | Admitting: Internal Medicine

## 2016-06-02 ENCOUNTER — Other Ambulatory Visit: Payer: Self-pay | Admitting: Family Medicine

## 2016-06-02 DIAGNOSIS — E039 Hypothyroidism, unspecified: Secondary | ICD-10-CM | POA: Diagnosis not present

## 2016-06-02 DIAGNOSIS — I10 Essential (primary) hypertension: Secondary | ICD-10-CM

## 2016-06-02 DIAGNOSIS — E1149 Type 2 diabetes mellitus with other diabetic neurological complication: Secondary | ICD-10-CM

## 2016-06-02 DIAGNOSIS — E785 Hyperlipidemia, unspecified: Secondary | ICD-10-CM | POA: Diagnosis not present

## 2016-06-02 DIAGNOSIS — M8580 Other specified disorders of bone density and structure, unspecified site: Secondary | ICD-10-CM | POA: Diagnosis not present

## 2016-06-02 DIAGNOSIS — E559 Vitamin D deficiency, unspecified: Secondary | ICD-10-CM | POA: Diagnosis not present

## 2016-06-02 LAB — LIPID PANEL
CHOL/HDL RATIO: 3
Cholesterol: 126 mg/dL (ref 0–200)
HDL: 41.1 mg/dL (ref >39.00)
LDL CALC: 59 mg/dL (ref 0–99)
NONHDL: 84.69
Triglycerides: 128 mg/dL (ref 0.0–149.0)
VLDL: 25.6 mg/dL (ref 0.0–40.0)

## 2016-06-02 LAB — COMPREHENSIVE METABOLIC PANEL
ALK PHOS: 51 U/L (ref 39–117)
ALT: 24 U/L (ref 0–35)
AST: 16 U/L (ref 0–37)
Albumin: 4.3 g/dL (ref 3.5–5.2)
BUN: 14 mg/dL (ref 6–23)
CHLORIDE: 107 meq/L (ref 96–112)
CO2: 26 meq/L (ref 19–32)
Calcium: 9.7 mg/dL (ref 8.4–10.5)
Creatinine, Ser: 0.77 mg/dL (ref 0.40–1.20)
GFR: 79.44 mL/min (ref >60.00)
GLUCOSE: 117 mg/dL — AB (ref 70–99)
POTASSIUM: 3.5 meq/L (ref 3.5–5.1)
SODIUM: 142 meq/L (ref 135–145)
Total Bilirubin: 0.5 mg/dL (ref 0.2–1.2)
Total Protein: 7.1 g/dL (ref 6.0–8.3)

## 2016-06-02 LAB — CBC
HEMATOCRIT: 39.8 % (ref 36.0–46.0)
HEMOGLOBIN: 13.1 g/dL (ref 12.0–15.0)
MCHC: 33 g/dL (ref 30.0–36.0)
MCV: 92.6 fl (ref 78.0–100.0)
Platelets: 286 10*3/uL (ref 150.0–400.0)
RBC: 4.3 Mil/uL (ref 3.87–5.11)
RDW: 13.1 % (ref 11.5–15.5)
WBC: 8.1 10*3/uL (ref 4.0–10.5)

## 2016-06-02 LAB — VITAMIN D 25 HYDROXY (VIT D DEFICIENCY, FRACTURES): VITD: 50.86 ng/mL (ref 30.00–100.00)

## 2016-06-02 LAB — TSH: TSH: 1.68 u[IU]/mL (ref 0.35–4.50)

## 2016-06-02 LAB — HEMOGLOBIN A1C: Hgb A1c MFr Bld: 6.5 % (ref 4.6–6.5)

## 2016-06-02 NOTE — Telephone Encounter (Signed)
Please let patient know she did have the Zostavax in 2015 but while it is a good shot it is not 100% that she cannot get the shingles. If she develops a rash or worse pain she needs to come in. If she has associated Symptoms such as SOB, palpitations, n/v or diaphoresis she needs to get looked at

## 2016-06-02 NOTE — Telephone Encounter (Signed)
Vitamin D request on hold pending lab results from today, 06/02/2016.

## 2016-06-02 NOTE — Telephone Encounter (Signed)
Patient informed of PCP instructions and agreed to do so. 

## 2016-06-02 NOTE — Telephone Encounter (Signed)
Caller name:Donnamae Lodema Hong Relation to pt: self  Call back number: [phone] Pharmacy:  Reason for call: Pt came in office to have labs done, but pt stated is having some pain under her left breast all the way to the back, pt mentioned does not have any red areas nor small rash but is worry if she had her shingle shot done just in case it has to do with shingle. Pt states already has an appt set up for 06-10-16 with PCP and does not want to set up another one, pt just would like to know about the info about having her shingle shot or does she still have to come in earlier. Please advise.

## 2016-06-03 MED FILL — VIT D2 1.25 MG (50,000 UNIT: 1.25 MG | 28 days supply | Qty: 4 | Fill #0

## 2016-06-10 ENCOUNTER — Encounter: Payer: Self-pay | Admitting: Family Medicine

## 2016-06-10 ENCOUNTER — Ambulatory Visit (INDEPENDENT_AMBULATORY_CARE_PROVIDER_SITE_OTHER): Payer: Commercial Managed Care - HMO | Admitting: Family Medicine

## 2016-06-10 DIAGNOSIS — E785 Hyperlipidemia, unspecified: Secondary | ICD-10-CM

## 2016-06-10 DIAGNOSIS — E1149 Type 2 diabetes mellitus with other diabetic neurological complication: Secondary | ICD-10-CM | POA: Diagnosis not present

## 2016-06-10 DIAGNOSIS — I10 Essential (primary) hypertension: Secondary | ICD-10-CM | POA: Diagnosis not present

## 2016-06-10 DIAGNOSIS — M19042 Primary osteoarthritis, left hand: Secondary | ICD-10-CM

## 2016-06-10 DIAGNOSIS — M8580 Other specified disorders of bone density and structure, unspecified site: Secondary | ICD-10-CM

## 2016-06-10 DIAGNOSIS — E559 Vitamin D deficiency, unspecified: Secondary | ICD-10-CM

## 2016-06-10 DIAGNOSIS — M19041 Primary osteoarthritis, right hand: Secondary | ICD-10-CM

## 2016-06-10 DIAGNOSIS — R4189 Other symptoms and signs involving cognitive functions and awareness: Secondary | ICD-10-CM

## 2016-06-10 HISTORY — DX: Other symptoms and signs involving cognitive functions and awareness: R41.89

## 2016-06-10 NOTE — Assessment & Plan Note (Signed)
Well treated with supplements, will supplement with daily ITC neds

## 2016-06-10 NOTE — Assessment & Plan Note (Signed)
Left pinky finger flared recently encouraged topical Aspercreme bid. Also noting some left upper thoracic back pain with stiffness last week but that has improved. May try topical treatments and report any worsening symptoms

## 2016-06-10 NOTE — Assessment & Plan Note (Signed)
hgba1c acceptable, minimize simple carbs. Increase exercise as tolerated. Continue current meds 

## 2016-06-10 NOTE — Assessment & Plan Note (Signed)
Well controlled, no changes to meds. Encouraged heart healthy diet such as the DASH diet and exercise as tolerated.  °

## 2016-06-10 NOTE — Assessment & Plan Note (Signed)
Patient noting worsening troubles with memory. Remembers the distant past but cannot remember recent events. Her daughter has taken over paying her bills and she acknowledges more easily agitated than in the past. Will refer to neurology for further evaluation at patient request.

## 2016-06-10 NOTE — Assessment & Plan Note (Signed)
Tolerating statin, encouraged heart healthy diet, avoid trans fats, minimize simple carbs and saturated fats. Increase exercise as tolerated 

## 2016-06-10 NOTE — Patient Instructions (Addendum)
Vitamin D 2000 IU daily over the counter Take aspirin daily, eat a healthy hi fiber diet, exercise Try aspercreme for pain in pinky Try to learn a new thing   Hypertension Hypertension, commonly called high blood pressure, is when the force of blood pumping through your arteries is too strong. Your arteries are the blood vessels that carry blood from your heart throughout your body. A blood pressure reading consists of a higher number over a lower number, such as 110/72. The higher number (systolic) is the pressure inside your arteries when your heart pumps. The lower number (diastolic) is the pressure inside your arteries when your heart relaxes. Ideally you want your blood pressure below 120/80. Hypertension forces your heart to work harder to pump blood. Your arteries may become narrow or stiff. Having untreated or uncontrolled hypertension can cause heart attack, stroke, kidney disease, and other problems. What increases the risk? Some risk factors for high blood pressure are controllable. Others are not. Risk factors you cannot control include:  Race. You may be at higher risk if you are African American.  Age. Risk increases with age.  Gender. Men are at higher risk than women before age 31 years. After age 9, women are at higher risk than men. Risk factors you can control include:  Not getting enough exercise or physical activity.  Being overweight.  Getting too much fat, sugar, calories, or salt in your diet.  Drinking too much alcohol. What are the signs or symptoms? Hypertension does not usually cause signs or symptoms. Extremely high blood pressure (hypertensive crisis) may cause headache, anxiety, shortness of breath, and nosebleed. How is this diagnosed? To check if you have hypertension, your health care provider will measure your blood pressure while you are seated, with your arm held at the level of your heart. It should be measured at least twice using the same arm.  Certain conditions can cause a difference in blood pressure between your right and left arms. A blood pressure reading that is higher than normal on one occasion does not mean that you need treatment. If it is not clear whether you have high blood pressure, you may be asked to return on a different day to have your blood pressure checked again. Or, you may be asked to monitor your blood pressure at home for 1 or more weeks. How is this treated? Treating high blood pressure includes making lifestyle changes and possibly taking medicine. Living a healthy lifestyle can help lower high blood pressure. You may need to change some of your habits. Lifestyle changes may include:  Following the DASH diet. This diet is high in fruits, vegetables, and whole grains. It is low in salt, red meat, and added sugars.  Keep your sodium intake below 2,300 mg per day.  Getting at least 30-45 minutes of aerobic exercise at least 4 times per week.  Losing weight if necessary.  Not smoking.  Limiting alcoholic beverages.  Learning ways to reduce stress. Your health care provider may prescribe medicine if lifestyle changes are not enough to get your blood pressure under control, and if one of the following is true:  You are 51-42 years of age and your systolic blood pressure is above 140.  You are 27 years of age or older, and your systolic blood pressure is above 150.  Your diastolic blood pressure is above 90.  You have diabetes, and your systolic blood pressure is over [id number] or your diastolic blood pressure is over 90.  You have  kidney disease and your blood pressure is above 140/90.  You have heart disease and your blood pressure is above 140/90. Your personal target blood pressure may vary depending on your medical conditions, your age, and other factors. Follow these instructions at home:  Have your blood pressure rechecked as directed by your health care provider.  Take medicines only as directed by  your health care provider. Follow the directions carefully. Blood pressure medicines must be taken as prescribed. The medicine does not work as well when you skip doses. Skipping doses also puts you at risk for problems.  Do not smoke.  Monitor your blood pressure at home as directed by your health care provider. Contact a health care provider if:  You think you are having a reaction to medicines taken.  You have recurrent headaches or feel dizzy.  You have swelling in your ankles.  You have trouble with your vision. Get help right away if:  You develop a severe headache or confusion.  You have unusual weakness, numbness, or feel faint.  You have severe chest or abdominal pain.  You vomit repeatedly.  You have trouble breathing. This information is not intended to replace advice given to you by your health care provider. Make sure you discuss any questions you have with your health care provider. Document Released: 06/30/2005 Document Revised: 12/06/2015 Document Reviewed: 04/22/2013 Elsevier Interactive Patient Education  2017 [employer].
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
## 2016-06-10 NOTE — Progress Notes (Signed)
Pre visit review using our clinic review tool, if applicable. No additional management support is needed unless otherwise documented below in the visit note. 

## 2016-06-10 NOTE — Assessment & Plan Note (Signed)
Encouraged to get adequate exercise, calcium and vitamin d intake 

## 2016-06-17 ENCOUNTER — Telehealth: Payer: Self-pay | Admitting: Family Medicine

## 2016-06-17 ENCOUNTER — Encounter: Payer: Self-pay | Admitting: Internal Medicine

## 2016-06-17 ENCOUNTER — Ambulatory Visit (INDEPENDENT_AMBULATORY_CARE_PROVIDER_SITE_OTHER): Payer: Commercial Managed Care - HMO | Admitting: Internal Medicine

## 2016-06-17 VITALS — BP 130/70 | HR 84 | Ht 63.0 in | Wt 149.0 lb

## 2016-06-17 DIAGNOSIS — K641 Second degree hemorrhoids: Secondary | ICD-10-CM | POA: Diagnosis not present

## 2016-06-17 NOTE — Patient Instructions (Addendum)
  HEMORRHOID BANDING PROCEDURE    FOLLOW-UP CARE   1. The procedure you have had should have been relatively painless since the banding of the area involved does not have nerve endings and there is no pain sensation.  The rubber band cuts off the blood supply to the hemorrhoid and the band may fall off as soon as 48 hours after the banding (the band may occasionally be seen in the toilet bowl following a bowel movement). You may notice a temporary feeling of fullness in the rectum which should respond adequately to plain Tylenol or Motrin.  2. Following the banding, avoid strenuous exercise that evening and resume full activity the next day.  A sitz bath (soaking in a warm tub) or bidet is soothing, and can be useful for cleansing the area after bowel movements.     3. To avoid constipation, take two tablespoons of natural wheat bran, natural oat bran, flax, Benefiber or any over the counter fiber supplement and increase your water intake to 7-8 glasses daily.    4. Unless you have been prescribed anorectal medication, do not put anything inside your rectum for two weeks: No suppositories, enemas, fingers, etc.  5. Occasionally, you may have more bleeding than usual after the banding procedure.  This is often from the untreated hemorrhoids rather than the treated one.  Don't be concerned if there is a tablespoon or so of blood.  If there is more blood than this, lie flat with your bottom higher than your head and apply an ice pack to the area. If the bleeding does not stop within a half an hour or if you feel faint, call our office at [phone] or go to the emergency room.  6. Problems are not common; however, if there is a substantial amount of bleeding, severe pain, chills, fever or difficulty passing urine (very rare) or other problems, you should call us at [phone] or report to the nearest emergency room.  7. Do not stay seated continuously for more than 2-3 hours for a day or  two after the procedure.  Tighten your buttock muscles 10-15 times every two hours and take 10-15 deep breaths every 1-2 hours.  Do not spend more than a few minutes on the toilet if you cannot empty your bowel; instead re-visit the toilet at a later time.    We will see you back for more bandings on December 29th at 3:15pm.    I appreciate the opportunity to care for you. Silvano Rusk, MD, [facility]

## 2016-06-17 NOTE — Telephone Encounter (Signed)
Insurance auth # [id number], faxed to GI

## 2016-06-17 NOTE — Telephone Encounter (Signed)
[facility] called stating that patient needs a prior authorization for [employer]. Dx code is K64.8 Please advise   Patient [id number]

## 2016-06-18 ENCOUNTER — Encounter: Payer: Self-pay | Admitting: Internal Medicine

## 2016-06-18 DIAGNOSIS — K641 Second degree hemorrhoids: Secondary | ICD-10-CM | POA: Insufficient documentation

## 2016-06-18 NOTE — Progress Notes (Signed)
   Sxs: fecal soiling, rectal bleeding  Patti Martinique, CMA present.  Rectal: Fleshy skin tags RA and LL especially and small external component hemorrhoid RA  Anoscopy: Gr 2 inflamed internal hemorrhoids all 3 positions  PROCEDURE NOTE: The patient presents with symptomatic grade 2  hemorrhoids, requesting rubber band ligation of his/her hemorrhoidal disease.  All risks, benefits and alternative forms of therapy were described and informed consent was obtained.   The anorectum was pre-medicated with 0.125% NTG and 5% lidocaine The decision was made to band the RA and LL internal hemorrhoids, and the [place] was used to perform band ligation without complication.  Digital anorectal examination was then performed to assure proper positioning of the band, and to adjust the banded tissue as required.  The patient was discharged home without pain or other issues.  Dietary and behavioral recommendations were given and along with follow-up instructions.      The patient will return in 2-3 weeks for  follow-up and possible additional banding as required. No complications were encountered and the patient tolerated the procedure well.

## 2016-07-01 NOTE — Progress Notes (Deleted)
Pre visit review using our clinic review tool, if applicable. No additional management support is needed unless otherwise documented below in the visit note. 

## 2016-07-01 NOTE — Progress Notes (Deleted)
 Subjective:   Crystal [name] is a 65 y.o. female who presents for Medicare Annual (Subsequent) preventive examination.  Review of Systems:  No ROS.  Medicare Wellness Visit.     Sleep patterns: {SX; SLEEP PATTERNS:18802}.   Home Safety/Smoke Alarms:   Living environment; residence and Firearm Safety: {Rehab home environment / accessibility:30080}. Seat Belt Safety/Bike Helmet:   Counseling:   Eye Exam-  Dental-  Female:   Pap-        Mammo- last 04/14/16. BI-RADS CATEGORY  1: Negative.       Dexa scan- last 09/15/13. Osteopenia.        CCS- last 05/19/16 w/ Dr. Carl Gessner. Diverticulosis in left and right colon, internal hemorrhoids. 5 year recall.        Objective:     Vitals: There were no vitals taken for this visit.  There is no height or weight on file to calculate BMI.   Tobacco History  Smoking Status  . Never Smoker  Smokeless Tobacco  . Never Used     Counseling given: Not Answered   Past Medical History:  Diagnosis Date  . Acute peptic ulcer of stomach    As a teenager  . Allergy    dust, cock roach,grass,weeds,molds  . Anxiety   . Asthma    02/08/10 FEV1 1.98 (80%), s/p saba 2.22 l/m (90%).nml  . Cataract   . Cognitive change 06/10/2016  . Depression   . Diabetes mellitus 2003   Type II  . Diverticulosis   . Dyspepsia   . Fatty liver 12/19/2010  . GERD (gastroesophageal reflux disease)   . High cholesterol   . Hyperlipemia   . Hypertension   . Hypothyroidism 09/08/2013  . IBS (irritable bowel syndrome)   . Internal hemorrhoids   . OSA (obstructive sleep apnea)   . Plantar fasciitis   . Vitamin D deficiency 03/04/2016   Past Surgical History:  Procedure Laterality Date  . APPENDECTOMY     age 17  . CATARACT EXTRACTION, BILATERAL     12/8 and 12/14  . COLONOSCOPY  06/03/2010, 08/25/2011   diverticulosis   . GASTRECTOMY     partial - ulcer as a teen  . HAND SURGERY Right   . KNEE ARTHROSCOPY Left 1998  . NASAL SEPTUM SURGERY      . RHINOPLASTY    . TOE SURGERY Right 1995   Family History  Problem Relation Age of Onset  . Diabetes Mother     type II  . Heart attack Mother 50  . Colon cancer Sister   . Asthma Maternal Grandmother   . Mental retardation Son     PTSD  . Cancer Sister     stage 4 bladder cancer  . Colon polyps Sister   . Ulcers      bleeding gastric  . Heart defect      child  . Other      perforated bowel, child   History  Sexual Activity  . Sexual activity: No    Comment: lives with her mother, no dietary restrictions    Outpatient Encounter Prescriptions as of 07/02/2016  Medication Sig  . ACCU-CHEK SOFTCLIX LANCETS lancets Use as directed twice daily to check blood sugar. DX E11.9  . Alcohol Swabs (B-D SINGLE USE SWABS REGULAR) PADS Use as directed to check blood sugar. DX E11.9  . atorvastatin (LIPITOR) 80 MG tablet TAKE 1 TABLET (80 MG) BY MOUTH DAILY.  . Blood Glucose Calibration (ACCU-CHEK AVIVA) SOLN Use   as directed to check blood sugar.  DX E11.9  . Blood Glucose Monitoring Suppl (ACCU-CHEK AVIVA PLUS) w/Device KIT Use as directed to check blood sugar.  DX E11.9  . diazepam (VALIUM) 5 MG tablet Take 0.5 to 1 tablet by mouth twice daily as needed for anxiety  . escitalopram (LEXAPRO) 10 MG tablet Take 1 tablet (10 mg total) by mouth daily.  . fenofibrate 160 MG tablet Take 1 tablet (160 mg total) by mouth daily. (Patient taking differently: Take 160 mg by mouth at bedtime. )  . glimepiride (AMARYL) 2 MG tablet Take 2 mg by mouth 2 (two) times daily.  Marland Kitchen glucose blood (ACCU-CHEK AVIVA PLUS) test strip Use as directed twice daily to check blood sugar.  DX E11.9  . HYDROcodone-acetaminophen (NORCO/VICODIN) 5-325 MG tablet Take one by mouth at bedtime as needed for pain  . levothyroxine (SYNTHROID, LEVOTHROID) 25 MCG tablet Take 1 tablet (25 mcg total) by mouth daily before breakfast.  . losartan (COZAAR) 25 MG tablet Take 1 tablet (25 mg total) by mouth daily.  . Multiple  Vitamins-Minerals (CENTRUM SILVER PO) Take 1 tablet by mouth daily.    . pantoprazole (PROTONIX) 40 MG tablet Take 1 tablet (40 mg total) by mouth daily.  . Probiotic Product (ALIGN) 4 MG CAPS Take 1 capsule by mouth daily. Reported on 07/02/2015  . valACYclovir (VALTREX) 1000 MG tablet TAKE 1 TABLET (1,000 MG) BY MOUTH 2 TIMES A DAY  . VENTOLIN HFA 108 (90 Base) MCG/ACT inhaler INHALE 2 PUFFS BY MOUTH INTO THE LUNGS EVERY 4 (FOUR) HOURS AS NEEDED FOR SHORTNESS OF BREATH AND WHEEZING   No facility-administered encounter medications on file as of 07/02/2016.     Activities of Daily Living In your present state of health, do you have any difficulty performing the following activities: 07/02/2015  Hearing? N  Vision? N  Difficulty concentrating or making decisions? Y  Walking or climbing stairs? N  Dressing or bathing? N  Doing errands, shopping? N  Preparing Food and eating ? N  Using the Toilet? N  In the past six months, have you accidently leaked urine? Y  Do you have problems with loss of bowel control? Y  Managing your Medications? N  Managing your Finances? Y  Housekeeping or managing your Housekeeping? N  Some recent data might be hidden    Patient Care Team: [name], MD as PCP - General (Family Medicine) Calvert Cantor, MD as Consulting Physician (Ophthalmology) Gatha Mayer, MD as Consulting Physician (Gastroenterology) Wallene Huh, DPM as Consulting Physician (Podiatry) Malachi Pro, MD as Referring Physician (Otolaryngology) Dene Gentry, MD as Consulting Physician (Sports Medicine) Rigoberto Noel, MD as Consulting Physician (Pulmonary Disease)    Assessment:    Physical assessment deferred to PCP.  Exercise Activities and Dietary recommendations    Diet (meal preparation, eat out, water intake, caffeinated beverages, dairy products, fruits and vegetables): {Desc; diets:16563} Breakfast: Lunch:  Dinner:      Goals    . HEMOGLOBIN  A1C < 7.0    . Prevent Falls          Fall Prevention Tip Sheet given.       Fall Risk Fall Risk  07/13/2015 07/02/2015 06/19/2014  Falls in the past year? No Yes No  Number falls in past yr: - 2 or more -  Risk Factor Category  - High Fall Risk -  Follow up - Education provided;Falls prevention discussed -   Depression Screen PHQ  2/9 Scores 07/13/2015 07/02/2015 06/19/2014  PHQ - 2 Score 0 1 2     Cognitive Function MMSE - Mini Mental State Exam 07/02/2015  Orientation to time 5  Orientation to Place 5  Registration 3  Attention/ Calculation 5  Recall 1  Language- name 2 objects 2  Language- repeat 1  Language- follow 3 step command 3  Language- read & follow direction 1  Write a sentence 1  Copy design 1  Total score 28        Immunization History  Administered Date(s) Administered  . Influenza Split 05/26/2011, 03/31/2012  . Influenza Whole 03/27/2010  . Influenza,inj,Quad PF,36+ Mos 03/29/2013, 04/13/2014, 04/02/2015, 04/23/2016  . Pneumococcal Conjugate-13 04/25/2013  . Pneumococcal Polysaccharide-23 07/14/2004, 08/03/2015  . Zoster 12/27/2013   Screening Tests Health Maintenance  Topic Date Due  . OPHTHALMOLOGY EXAM  05/06/2016  . FOOT EXAM  07/01/2016  . HEMOGLOBIN A1C  11/30/2016  . MAMMOGRAM  04/14/2018  . COLONOSCOPY  05/19/2021  . TETANUS/TDAP  09/10/2021  . INFLUENZA VACCINE  Completed  . DEXA SCAN  Completed  . ZOSTAVAX  Completed  . Hepatitis C Screening  Completed  . PNA vac Low Risk Adult  Completed      Plan:    Follow-up w/ Dr. Charlett Blake as scheduled.   During the course of the visit the patient was educated and counseled about the following appropriate screening and preventive services:   Vaccines to include Pneumoccal, Influenza, Hepatitis B, Td, Zostavax, HCV  Electrocardiogram  Cardiovascular Disease  Colorectal cancer screening  Bone density screening  Diabetes screening  Glaucoma screening  Mammography/PAP  Nutrition  counseling   Patient Instructions (the written plan) was given to the patient.   Dorrene German, RN  07/01/2016

## 2016-07-02 ENCOUNTER — Ambulatory Visit: Payer: Commercial Managed Care - HMO | Admitting: *Deleted

## 2016-07-02 NOTE — Progress Notes (Deleted)
Subjective:   Crystal [name] is a 65 y.o. female who presents for Medicare Annual (Subsequent) preventive examination.  Review of Systems:  No ROS.  Medicare Wellness Visit.    Sleep patterns:  Home Safety/Smoke Alarms:   Living environment; residence and Firearm Safety: . Seat Belt Safety/Bike Helmet:   Counseling:   Eye Exam-  Dental-  Female:   Pap-       Mammo- last 04/14/16. BI-RADS CATEGORY 1: Negative.       Dexa scan- last 09/15/13. Osteopenia.        CCS- last 05/19/16 w/ Dr. Silvano Rusk. Diverticulosis in left and right colon, internal hemorrhoids. 5 year recall.           Objective:     Vitals: There were no vitals taken for this visit.  There is no height or weight on file to calculate BMI.   Tobacco History  Smoking Status  . Never Smoker  Smokeless Tobacco  . Never Used     Counseling given: Not Answered   Past Medical History:  Diagnosis Date  . Acute peptic ulcer of stomach    As a teenager  . Allergy    dust, cock roach,grass,weeds,molds  . Anxiety   . Asthma    02/08/10 FEV1 1.98 (80%), s/p saba 2.22 l/m (90%).nml  . Cataract   . Cognitive change 06/10/2016  . Depression   . Diabetes mellitus 2003   Type II  . Diverticulosis   . Dyspepsia   . Fatty liver 12/19/2010  . GERD (gastroesophageal reflux disease)   . High cholesterol   . Hyperlipemia   . Hypertension   . Hypothyroidism 09/08/2013  . IBS (irritable bowel syndrome)   . Internal hemorrhoids   . OSA (obstructive sleep apnea)   . Plantar fasciitis   . Vitamin D deficiency 03/04/2016   Past Surgical History:  Procedure Laterality Date  . APPENDECTOMY     age 53  . CATARACT EXTRACTION, BILATERAL     12/8 and 12/14  . COLONOSCOPY  06/03/2010, 08/25/2011   diverticulosis   . GASTRECTOMY     partial - ulcer as a teen  . HAND SURGERY Right   . KNEE ARTHROSCOPY Left 1998  . NASAL SEPTUM SURGERY    . RHINOPLASTY    . TOE SURGERY Right 1995   Family History    Problem Relation Age of Onset  . Diabetes Mother     type II  . Heart attack Mother 35  . Colon cancer Sister   . Asthma Maternal Grandmother   . Mental retardation Son     PTSD  . Cancer Sister     stage 4 bladder cancer  . Colon polyps Sister   . Ulcers      bleeding gastric  . Heart defect      child  . Other      perforated bowel, child   History  Sexual Activity  . Sexual activity: No    Comment: lives with her mother, no dietary restrictions    Outpatient Encounter Prescriptions as of 07/02/2016  Medication Sig  . ACCU-CHEK SOFTCLIX LANCETS lancets Use as directed twice daily to check blood sugar. DX E11.9  . Alcohol Swabs (B-D SINGLE USE SWABS REGULAR) PADS Use as directed to check blood sugar. DX E11.9  . atorvastatin (LIPITOR) 80 MG tablet TAKE 1 TABLET (80 MG) BY MOUTH DAILY.  Marland Kitchen Blood Glucose Calibration (ACCU-CHEK AVIVA) SOLN Use as directed to check blood sugar.  DX  E11.9  . Blood Glucose Monitoring Suppl (ACCU-CHEK AVIVA PLUS) w/Device KIT Use as directed to check blood sugar.  DX E11.9  . diazepam (VALIUM) 5 MG tablet Take 0.5 to 1 tablet by mouth twice daily as needed for anxiety  . escitalopram (LEXAPRO) 10 MG tablet Take 1 tablet (10 mg total) by mouth daily.  . fenofibrate 160 MG tablet Take 1 tablet (160 mg total) by mouth daily. (Patient taking differently: Take 160 mg by mouth at bedtime. )  . glimepiride (AMARYL) 2 MG tablet Take 2 mg by mouth 2 (two) times daily.  Marland Kitchen glucose blood (ACCU-CHEK AVIVA PLUS) test strip Use as directed twice daily to check blood sugar.  DX E11.9  . HYDROcodone-acetaminophen (NORCO/VICODIN) 5-325 MG tablet Take one by mouth at bedtime as needed for pain  . levothyroxine (SYNTHROID, LEVOTHROID) 25 MCG tablet Take 1 tablet (25 mcg total) by mouth daily before breakfast.  . losartan (COZAAR) 25 MG tablet Take 1 tablet (25 mg total) by mouth daily.  . Multiple Vitamins-Minerals (CENTRUM SILVER PO) Take 1 tablet by mouth daily.     . pantoprazole (PROTONIX) 40 MG tablet Take 1 tablet (40 mg total) by mouth daily.  . Probiotic Product (ALIGN) 4 MG CAPS Take 1 capsule by mouth daily. Reported on 07/02/2015  . valACYclovir (VALTREX) 1000 MG tablet TAKE 1 TABLET (1,000 MG) BY MOUTH 2 TIMES A DAY  . VENTOLIN HFA 108 (90 Base) MCG/ACT inhaler INHALE 2 PUFFS BY MOUTH INTO THE LUNGS EVERY 4 (FOUR) HOURS AS NEEDED FOR SHORTNESS OF BREATH AND WHEEZING   No facility-administered encounter medications on file as of 07/02/2016.     Activities of Daily Living No flowsheet data found.  Patient Care Team: [name], MD as PCP - General (Family Medicine) Calvert Cantor, MD as Consulting Physician (Ophthalmology) Gatha Mayer, MD as Consulting Physician (Gastroenterology) Wallene Huh, DPM as Consulting Physician (Podiatry) Malachi Pro, MD as Referring Physician (Otolaryngology) Dene Gentry, MD as Consulting Physician (Sports Medicine) Rigoberto Noel, MD as Consulting Physician (Pulmonary Disease)    Assessment:    Physical assessment deferred to PCP.  Exercise Activities and Dietary recommendations   Diet (meal preparation, eat out, water intake, caffeinated beverages, dairy products, fruits and vegetables): {Desc; diets:16563} Breakfast: Lunch:  Dinner:      Goals    . HEMOGLOBIN A1C < 7.0    . Prevent Falls          Fall Prevention Tip Sheet given.       Fall Risk Fall Risk  07/13/2015 07/02/2015 06/19/2014  Falls in the past year? No Yes No  Number falls in past yr: - 2 or more -  Risk Factor Category  - High Fall Risk -  Follow up - Education provided;Falls prevention discussed -   Depression Screen PHQ 2/9 Scores 07/13/2015 07/02/2015 06/19/2014  PHQ - 2 Score 0 1 2     Cognitive Function MMSE - Mini Mental State Exam 07/02/2015  Orientation to time 5  Orientation to Place 5  Registration 3  Attention/ Calculation 5  Recall 1  Language- name 2 objects 2  Language- repeat  1  Language- follow 3 step command 3  Language- read & follow direction 1  Write a sentence 1  Copy design 1  Total score 28        Immunization History  Administered Date(s) Administered  . Influenza Split 05/26/2011, 03/31/2012  . Influenza Whole 03/27/2010  . Influenza,inj,Quad PF,36+  Mos 03/29/2013, 04/13/2014, 04/02/2015, 04/23/2016  . Pneumococcal Conjugate-13 04/25/2013  . Pneumococcal Polysaccharide-23 07/14/2004, 08/03/2015  . Zoster 12/27/2013   Screening Tests Health Maintenance  Topic Date Due  . OPHTHALMOLOGY EXAM  05/06/2016  . FOOT EXAM  07/01/2016  . HEMOGLOBIN A1C  11/30/2016  . MAMMOGRAM  04/14/2018  . COLONOSCOPY  05/19/2021  . TETANUS/TDAP  09/10/2021  . INFLUENZA VACCINE  Completed  . DEXA SCAN  Completed  . ZOSTAVAX  Completed  . Hepatitis C Screening  Completed  . PNA vac Low Risk Adult  Completed      Plan:   *** During the course of the visit the patient was educated and counseled about the following appropriate screening and preventive services:   Vaccines to include Pneumoccal, Influenza, Hepatitis B, Td, Zostavax, HCV  Cardiovascular Disease  Colorectal cancer screening  Bone density screening  Diabetes screening  Glaucoma screening  Mammography/PAP  Nutrition counseling   Patient Instructions (the written plan) was given to the patient.   Shela Nevin, [place]  07/02/2016

## 2016-07-02 NOTE — Progress Notes (Deleted)
Pre visit review using our clinic review tool, if applicable. No additional management support is needed unless otherwise documented below in the visit note. 

## 2016-07-10 ENCOUNTER — Other Ambulatory Visit: Payer: Self-pay | Admitting: Family Medicine

## 2016-07-10 ENCOUNTER — Telehealth: Payer: Self-pay | Admitting: Family Medicine

## 2016-07-10 DIAGNOSIS — H9190 Unspecified hearing loss, unspecified ear: Secondary | ICD-10-CM

## 2016-07-10 NOTE — Telephone Encounter (Signed)
done

## 2016-07-10 NOTE — Telephone Encounter (Signed)
Patient called requesting a referral to [facility] with Dr. Derrill Kay for a hearing test. Please advise   Dr. Kathleen Argue phone: [phone] fax: [phone]   Patient phone: [phone]

## 2016-07-11 ENCOUNTER — Encounter: Payer: Self-pay | Admitting: Internal Medicine

## 2016-07-11 ENCOUNTER — Ambulatory Visit (INDEPENDENT_AMBULATORY_CARE_PROVIDER_SITE_OTHER): Payer: Commercial Managed Care - HMO | Admitting: Internal Medicine

## 2016-07-11 DIAGNOSIS — K641 Second degree hemorrhoids: Secondary | ICD-10-CM | POA: Diagnosis not present

## 2016-07-11 NOTE — Assessment & Plan Note (Signed)
RP banded RTC prn

## 2016-07-11 NOTE — Progress Notes (Signed)
   Sxs: fecal soiling PROCEDURE NOTE: The patient presents with symptomatic grade 2  hemorrhoids, requesting rubber band ligation of his/her hemorrhoidal disease.  All risks, benefits and alternative forms of therapy were described and informed consent was obtained.   The anorectum was pre-medicated with 0.125% NTG and 5% lidocaine The decision was made to band the RP internal hemorrhoid, and the [place] was used to perform band ligation without complication.  Digital anorectal examination was then performed to assure proper positioning of the band, and to adjust the banded tissue as required.  The patient was discharged home without pain or other issues.  Dietary and behavioral recommendations were given and along with follow-up instructions.      The patient will return prn for  follow-up and possible additional banding as required. No complications were encountered and the patient tolerated the procedure well.

## 2016-07-11 NOTE — Patient Instructions (Signed)
HEMORRHOID BANDING PROCEDURE    FOLLOW-UP CARE   1. The procedure you have had should have been relatively painless since the banding of the area involved does not have nerve endings and there is no pain sensation.  The rubber band cuts off the blood supply to the hemorrhoid and the band may fall off as soon as 48 hours after the banding (the band may occasionally be seen in the toilet bowl following a bowel movement). You may notice a temporary feeling of fullness in the rectum which should respond adequately to plain Tylenol or Motrin.  2. Following the banding, avoid strenuous exercise that evening and resume full activity the next day.  A sitz bath (soaking in a warm tub) or bidet is soothing, and can be useful for cleansing the area after bowel movements.     3. To avoid constipation, take two tablespoons of natural wheat bran, natural oat bran, flax, Benefiber or any over the counter fiber supplement and increase your water intake to 7-8 glasses daily.    4. Unless you have been prescribed anorectal medication, do not put anything inside your rectum for two weeks: No suppositories, enemas, fingers, etc.  5. Occasionally, you may have more bleeding than usual after the banding procedure.  This is often from the untreated hemorrhoids rather than the treated one.  Don't be concerned if there is a tablespoon or so of blood.  If there is more blood than this, lie flat with your bottom higher than your head and apply an ice pack to the area. If the bleeding does not stop within a half an hour or if you feel faint, call our office at [phone] or go to the emergency room.  6. Problems are not common; however, if there is a substantial amount of bleeding, severe pain, chills, fever or difficulty passing urine (very rare) or other problems, you should call us at [phone] or report to the nearest emergency room.  7. Do not stay seated continuously for more than 2-3 hours for a day or two  after the procedure.  Tighten your buttock muscles 10-15 times every two hours and take 10-15 deep breaths every 1-2 hours.  Do not spend more than a few minutes on the toilet if you cannot empty your bowel; instead re-visit the toilet at a later time.    If you don't feel better by the end of February call us back.    I appreciate the opportunity to care for you. Silvano Rusk, MD, [facility]

## 2016-07-15 DIAGNOSIS — K006 Disturbances in tooth eruption: Secondary | ICD-10-CM | POA: Diagnosis not present

## 2016-07-23 DIAGNOSIS — H903 Sensorineural hearing loss, bilateral: Secondary | ICD-10-CM | POA: Diagnosis not present

## 2016-07-24 ENCOUNTER — Telehealth: Payer: Self-pay | Admitting: Family Medicine

## 2016-07-24 NOTE — Telephone Encounter (Signed)
Pt called in to schedule an appt. She says that she has been feeling off balance /"lack of balance". Transferred pt to [facility] for triaging.

## 2016-07-24 NOTE — Telephone Encounter (Signed)
PLEASE NOTE: All timestamps contained within this report are represented as [place] Standard Time. CONFIDENTIALTY NOTICE: This fax transmission is intended only for the addressee. It contains information that is legally privileged, confidential or otherwise protected from use or disclosure. If you are not the intended recipient, you are strictly prohibited from reviewing, disclosing, copying using or disseminating any of this information or taking any action in reliance on or regarding this information. If you have received this fax in error, please notify us immediately by telephone so that we can arrange for its return to [place]. Phone: [phone], Toll-Free: [phone], Fax: [phone] Page: 1 of 1 Call Id: [id number] [facility] - Client [place] Patient Name: Crystal [name] DOB: 09-01-1950 Initial Comment Caller states, she is having feels off balance and stumbling. When a gym she has no balance to stand. Verified Nurse Assessment Nurse: Chesley Noon, RN, Lattie Haw Date/Time Eilene Ghazi Time): 07/24/2016 9:41:23 AM Confirm and document reason for call. If symptomatic, describe symptoms. ---Caller states she is having dizziness that is getting worse over the last couple of months. She is having difficulty standing now without holding on to something. It seems more like a balance issue. Does the patient have any new or worsening symptoms? ---Yes Will a triage be completed? ---Yes Related visit to physician within the last 2 weeks? ---No Does the PT have any chronic conditions? (i.e. diabetes, asthma, etc.) ---Yes List chronic conditions. ---diabetes, HTN, high cholesterol Is this a behavioral health or substance abuse call? ---No Guidelines Guideline Title Affirmed Question Affirmed Notes Dizziness - Vertigo [1] Dizziness (vertigo) present now AND [2] one or more stroke risk factors (i.e., hypertension, diabetes, prior  stroke/TIA/ heart attack) (Exception: prior physician evaluation for this AND no different/worse than usual) Final Disposition User Go to ED Now (or PCP triage) Chesley Noon, RN, Lattie Haw Referrals [facility] REFUSED Disagree/Comply: Disagree Disagree/Comply Reason: Disagree with instructions

## 2016-07-24 NOTE — Telephone Encounter (Signed)
Crystal [name] - RN  Triage nurse called in to make provider aware that pt is refusing to go to the ED as advised.

## 2016-07-24 NOTE — Telephone Encounter (Signed)
Called to follow up with patient. Pt states she doesn't have an emergency or anything.  States she called because she wanted Dr. Charlett Blake to be made aware that she has been noticing that her balance has been a little off lately.  She denied dizziness, chest pain, shortness of breath.  Said she's currently out walking the dog without difficulty, but at times she notices that she has to hold on to things to keep from losing her balance.    Pt scheduled to see Dr. Charlett Blake tomorrow (07/25/16) at 3:15 pm.  She was advised if symptoms worsen or new symptoms develop (dizziness, chest pain, shortness of breath, etc) to go to the ER.  She stated understanding and agreed that she would.    Message routed to PCP for FYI.

## 2016-07-25 ENCOUNTER — Ambulatory Visit: Payer: Commercial Managed Care - HMO | Admitting: Family Medicine

## 2016-08-04 DIAGNOSIS — M5136 Other intervertebral disc degeneration, lumbar region: Secondary | ICD-10-CM | POA: Diagnosis not present

## 2016-08-04 DIAGNOSIS — M5135 Other intervertebral disc degeneration, thoracolumbar region: Secondary | ICD-10-CM | POA: Diagnosis not present

## 2016-08-04 DIAGNOSIS — M9902 Segmental and somatic dysfunction of thoracic region: Secondary | ICD-10-CM | POA: Diagnosis not present

## 2016-08-04 DIAGNOSIS — M9904 Segmental and somatic dysfunction of sacral region: Secondary | ICD-10-CM | POA: Diagnosis not present

## 2016-08-04 DIAGNOSIS — M9903 Segmental and somatic dysfunction of lumbar region: Secondary | ICD-10-CM | POA: Diagnosis not present

## 2016-08-04 DIAGNOSIS — M5137 Other intervertebral disc degeneration, lumbosacral region: Secondary | ICD-10-CM | POA: Diagnosis not present

## 2016-08-05 ENCOUNTER — Telehealth: Payer: Self-pay | Admitting: Family Medicine

## 2016-08-05 NOTE — Telephone Encounter (Signed)
Yes it is considered safe. Just so she understands. Some people respond to GC/MSM some do not. It works slowly so I encourage people who want to try it to commit to a 90 day trial if they do not experience any significant side effects (such as dyspepsia) then if it has helped some keep taking it if not then it is likely not going to help her and she can stop

## 2016-08-05 NOTE — Telephone Encounter (Signed)
Patient informed of PCP instructions. 

## 2016-08-05 NOTE — Telephone Encounter (Signed)
Patient wanted to know if ok/safe to take Triflex from [facility] store--states ingredients glucosamine chondroitin MSM. Call back number [phone]

## 2016-08-08 ENCOUNTER — Ambulatory Visit (HOSPITAL_BASED_OUTPATIENT_CLINIC_OR_DEPARTMENT_OTHER)
Admission: RE | Admit: 2016-08-08 | Discharge: 2016-08-08 | Disposition: A | Discharge status: Home / Self Care | Payer: Medicare HMO | Source: Ambulatory Visit | Attending: Family Medicine | Admitting: Family Medicine

## 2016-08-08 ENCOUNTER — Ambulatory Visit (INDEPENDENT_AMBULATORY_CARE_PROVIDER_SITE_OTHER): Payer: Medicare HMO | Admitting: Family Medicine

## 2016-08-08 ENCOUNTER — Encounter: Payer: Self-pay | Admitting: Family Medicine

## 2016-08-08 DIAGNOSIS — I10 Essential (primary) hypertension: Secondary | ICD-10-CM | POA: Diagnosis not present

## 2016-08-08 DIAGNOSIS — M545 Low back pain: Secondary | ICD-10-CM | POA: Diagnosis not present

## 2016-08-08 DIAGNOSIS — M2550 Pain in unspecified joint: Secondary | ICD-10-CM

## 2016-08-08 DIAGNOSIS — E785 Hyperlipidemia, unspecified: Secondary | ICD-10-CM

## 2016-08-08 DIAGNOSIS — M25552 Pain in left hip: Secondary | ICD-10-CM | POA: Diagnosis not present

## 2016-08-08 DIAGNOSIS — E1149 Type 2 diabetes mellitus with other diabetic neurological complication: Secondary | ICD-10-CM | POA: Diagnosis not present

## 2016-08-08 HISTORY — DX: Pain in unspecified joint: M25.50

## 2016-08-08 MED ORDER — TIZANIDINE HCL 2 MG PO TABS: 2.0000 mg | ORAL_TABLET | Freq: Two times a day (BID) | ORAL | Status: DC | PRN

## 2016-08-08 NOTE — Assessment & Plan Note (Addendum)
Poorly controlled will alter medications, encouraged DASH diet, minimize caffeine and obtain adequate sleep. Report concerning symptoms and follow up as directed and as needed. Took her blood pressure med this am but had not taken in a week before that.

## 2016-08-08 NOTE — Progress Notes (Signed)
Patient ID: Crystal [name], female   DOB: [birth year], 66 y.o.   MRN: [id number]

## 2016-08-08 NOTE — Progress Notes (Signed)
Subjective:    Patient ID: Crystal [name], female    DOB: 1951-01-16, 66 y.o.   MRN: [id number]  Chief Complaint  Patient presents with  . Back Pain    lower back.    HPI Patient is in today for low back pain. Patient is also following up on hypertension, sleep apnea, GERD, DM, hyperlipidemia. Patient also has a complaint of arthritis pain in her hips, legs, toes and back. She denies any falls or trauma. No incontinence. No fevers, chills change in bowel or bladder habits. Denies CP/palp/SOB/HA/congestion/fevers/GI or GU c/o. Taking meds as prescribed  I acted as a Education administrator for Dr. Charlett Blake. Raiford Noble, [place]   Past Medical History:  Diagnosis Date  . Acute peptic ulcer of stomach    As a teenager  . Allergy    dust, cock roach,grass,weeds,molds  . Anxiety   . Asthma    02/08/10 FEV1 1.98 (80%), s/p saba 2.22 l/m (90%).nml  . Cataract   . Cognitive change 06/10/2016  . Depression   . Diabetes mellitus 2003   Type II  . Diverticulosis   . Dyspepsia   . Fatty liver 12/19/2010  . GERD (gastroesophageal reflux disease)   . High cholesterol   . Hyperlipemia   . Hypertension   . Hypothyroidism 09/08/2013  . IBS (irritable bowel syndrome)   . Internal hemorrhoids   . Joint pain 08/08/2016  . Low back pain 08/10/2016  . OSA (obstructive sleep apnea)   . Plantar fasciitis   . Vitamin D deficiency 03/04/2016    Past Surgical History:  Procedure Laterality Date  . APPENDECTOMY     age 33  . CATARACT EXTRACTION, BILATERAL     12/8 and 12/14  . COLONOSCOPY  06/03/2010, 08/25/2011   diverticulosis   . GASTRECTOMY     partial - ulcer as a teen  . HAND SURGERY Right   . HEMORRHOID BANDING    . KNEE ARTHROSCOPY Left 1998  . NASAL SEPTUM SURGERY    . RHINOPLASTY    . TOE SURGERY Right 1995    Family History  Problem Relation Age of Onset  . Diabetes Mother     type II  . Heart attack Mother 24  . Colon cancer Sister   . Asthma Maternal Grandmother   . Mental retardation  Son     PTSD  . Cancer Sister     stage 4 bladder cancer  . Colon polyps Sister   . Ulcers      bleeding gastric  . Heart defect      child  . Other      perforated bowel, child    Social History   Social History  . Marital status: Married    Spouse name: [name]  . Number of children: 4  . Years of education: N/A   Occupational History  . retired Orthoptist   Social History Main Topics  . Smoking status: Never Smoker  . Smokeless tobacco: Never Used  . Alcohol use 0.0 oz/week     Comment: Rarely  . Drug use: No  . Sexual activity: No     Comment: lives with her mother, no dietary restrictions   Other Topics Concern  . Not on file   Social History Narrative   No caffeine drinks daily     Outpatient Medications Prior to Visit  Medication Sig Dispense Refill  . ACCU-CHEK SOFTCLIX LANCETS lancets Use as directed twice daily to check blood sugar. DX E11.9 200 each 3  .  Alcohol Swabs (B-D SINGLE USE SWABS REGULAR) PADS Use as directed to check blood sugar. DX E11.9 100 each 3  . atorvastatin (LIPITOR) 80 MG tablet TAKE 1 TABLET (80 MG) BY MOUTH DAILY. 90 tablet 2  . Blood Glucose Calibration (ACCU-CHEK AVIVA) SOLN Use as directed to check blood sugar.  DX E11.9 1 each 3  . Blood Glucose Monitoring Suppl (ACCU-CHEK AVIVA PLUS) w/Device KIT Use as directed to check blood sugar.  DX E11.9 1 kit 0  . Cholecalciferol (VITAMIN D3) 2000 units TABS Take 1 tablet by mouth daily.    . diazepam (VALIUM) 5 MG tablet Take 0.5 to 1 tablet by mouth twice daily as needed for anxiety 40 tablet 2  . escitalopram (LEXAPRO) 10 MG tablet Take 1 tablet (10 mg total) by mouth daily. 90 tablet 2  . fenofibrate 160 MG tablet Take 1 tablet (160 mg total) by mouth daily. (Patient taking differently: Take 160 mg by mouth at bedtime. ) 90 tablet 2  . glimepiride (AMARYL) 2 MG tablet Take 2 mg by mouth 2 (two) times daily.    Marland Kitchen glucose blood (ACCU-CHEK AVIVA PLUS) test strip Use as directed twice daily to  check blood sugar.  DX E11.9 200 each 3  . HYDROcodone-acetaminophen (NORCO/VICODIN) 5-325 MG tablet Take one by mouth at bedtime as needed for pain 10 tablet 0  . levothyroxine (SYNTHROID, LEVOTHROID) 25 MCG tablet Take 1 tablet (25 mcg total) by mouth daily before breakfast. 90 tablet 2  . losartan (COZAAR) 25 MG tablet Take 1 tablet (25 mg total) by mouth daily. 90 tablet 2  . Multiple Vitamins-Minerals (CENTRUM SILVER PO) Take 1 tablet by mouth daily.      . pantoprazole (PROTONIX) 40 MG tablet Take 1 tablet (40 mg total) by mouth daily. 90 tablet 1  . Probiotic Product (ALIGN) 4 MG CAPS Take 1 capsule by mouth daily. Reported on 07/02/2015    . valACYclovir (VALTREX) 1000 MG tablet TAKE 1 TABLET (1,000 MG) BY MOUTH 2 TIMES A DAY 60 tablet 0  . VENTOLIN HFA 108 (90 Base) MCG/ACT inhaler INHALE 2 PUFFS BY MOUTH INTO THE LUNGS EVERY 4 (FOUR) HOURS AS NEEDED FOR SHORTNESS OF BREATH AND WHEEZING 18 g 0   No facility-administered medications prior to visit.     Allergies  Allergen Reactions  . Compazine [Prochlorperazine Edisylate]     Comma for 3 days  . Tetanus Toxoid Other (See Comments)    convulsions    Review of Systems  Constitutional: Negative for fever and malaise/fatigue.  HENT: Negative for congestion.   Eyes: Negative for blurred vision.  Respiratory: Negative for cough and shortness of breath.   Cardiovascular: Negative for chest pain, palpitations and leg swelling.  Gastrointestinal: Negative for vomiting.  Musculoskeletal: Positive for back pain and joint pain.  Skin: Negative for rash.  Neurological: Negative for loss of consciousness and headaches.       Objective:    Physical Exam  Constitutional: She is oriented to person, place, and time. She appears well-developed and well-nourished. No distress.  HENT:  Head: Normocephalic and atraumatic.  Eyes: Conjunctivae are normal.  Neck: Normal range of motion. No thyromegaly present.  Cardiovascular: Normal rate  and regular rhythm.   Pulmonary/Chest: Effort normal and breath sounds normal. She has no wheezes.  Abdominal: Soft. Bowel sounds are normal. There is no tenderness.  Musculoskeletal: She exhibits no edema or deformity.  Neurological: She is alert and oriented to person, place, and time.  Skin: Skin  is warm and dry. She is not diaphoretic.  Psychiatric: She has a normal mood and affect.    BP (!) 160/99 (BP Location: Left Arm, Patient Position: Sitting, Cuff Size: Normal)   Pulse 64   Temp 97.6 F (36.4 C) (Oral)   Wt 145 lb 9.6 oz (66 kg)   SpO2 98% Comment: RA  BMI 25.79 kg/m  Wt Readings from Last 3 Encounters:  08/08/16 145 lb 9.6 oz (66 kg)  07/11/16 149 lb 9.6 oz (67.9 kg)  06/17/16 149 lb (67.6 kg)     Lab Results  Component Value Date   WBC 8.1 06/02/2016   HGB 13.1 06/02/2016   HCT 39.8 06/02/2016   PLT 286.0 06/02/2016   GLUCOSE 117 (H) 06/02/2016   CHOL 126 06/02/2016   TRIG 128.0 06/02/2016   HDL 41.10 06/02/2016   LDLDIRECT 123.0 09/18/2014   LDLCALC 59 06/02/2016   ALT 24 06/02/2016   AST 16 06/02/2016   NA 142 06/02/2016   K 3.5 06/02/2016   CL 107 06/02/2016   CREATININE 0.77 06/02/2016   BUN 14 06/02/2016   CO2 26 06/02/2016   TSH 1.68 06/02/2016   HGBA1C 6.5 06/02/2016   MICROALBUR 1.3 04/30/2015    Lab Results  Component Value Date   TSH 1.68 06/02/2016   Lab Results  Component Value Date   WBC 8.1 06/02/2016   HGB 13.1 06/02/2016   HCT 39.8 06/02/2016   MCV 92.6 06/02/2016   PLT 286.0 06/02/2016   Lab Results  Component Value Date   NA 142 06/02/2016   K 3.5 06/02/2016   CO2 26 06/02/2016   GLUCOSE 117 (H) 06/02/2016   BUN 14 06/02/2016   CREATININE 0.77 06/02/2016   BILITOT 0.5 06/02/2016   ALKPHOS 51 06/02/2016   AST 16 06/02/2016   ALT 24 06/02/2016   PROT 7.1 06/02/2016   ALBUMIN 4.3 06/02/2016   CALCIUM 9.7 06/02/2016   ANIONGAP 7 01/08/2015   GFR 79.44 06/02/2016   Lab Results  Component Value Date   CHOL 126  06/02/2016   Lab Results  Component Value Date   HDL 41.10 06/02/2016   Lab Results  Component Value Date   LDLCALC 59 06/02/2016   Lab Results  Component Value Date   TRIG 128.0 06/02/2016   Lab Results  Component Value Date   CHOLHDL 3 06/02/2016   Lab Results  Component Value Date   HGBA1C 6.5 06/02/2016       Assessment & Plan:   Problem List Items Addressed This Visit    DM (diabetes mellitus), type 2 with neurological complications ([place])    [id number] acceptable, minimize simple carbs. Increase exercise as tolerated.       Hyperlipidemia    Encouraged heart healthy diet, increase exercise, avoid trans fats, consider a krill oil cap daily      Essential hypertension    Poorly controlled will alter medications, encouraged DASH diet, minimize caffeine and obtain adequate sleep. Report concerning symptoms and follow up as directed and as needed. Took her blood pressure med this am but had not taken in a week before that.       Joint pain   Relevant Medications   tiZANidine (ZANAFLEX) tablet 2 mg   Other Relevant Orders   Sedimentation rate   Rheumatoid Factor   DG HIP UNILAT WITH PELVIS 2-3 VIEWS LEFT (Completed)   Low back pain    Encouraged moist heat and gentle stretching as tolerated. May try NSAIDs and prescription  meds as directed and report if symptoms worsen or seek immediate care. Xray confirms chronic degenerative changes and she is given rx for Tizanidine and should add topical treatments. Report if no improvement for further referral.       Relevant Medications   tiZANidine (ZANAFLEX) tablet 2 mg      I am having Ms. Terance Hart maintain her Multiple Vitamins-Minerals (CENTRUM SILVER PO), ALIGN, diazepam, valACYclovir, losartan, levothyroxine, fenofibrate, atorvastatin, ACCU-CHEK AVIVA PLUS, ACCU-CHEK AVIVA, B-D SINGLE USE SWABS REGULAR, glucose blood, ACCU-CHEK SOFTCLIX LANCETS, pantoprazole, glimepiride, VENTOLIN HFA, HYDROcodone-acetaminophen,  escitalopram, and Vitamin D3. We will continue to administer tiZANidine.  Meds ordered this encounter  Medications  . tiZANidine (ZANAFLEX) tablet 2 mg    51m-4mg    CMA served as scribe during this visit. History, Physical and Plan performed by medical provider. Documentation and orders reviewed and attested to.  BPenni Homans MD

## 2016-08-10 ENCOUNTER — Encounter: Payer: Self-pay | Admitting: Family Medicine

## 2016-08-10 DIAGNOSIS — M545 Low back pain, unspecified: Secondary | ICD-10-CM | POA: Insufficient documentation

## 2016-08-10 HISTORY — DX: Low back pain, unspecified: M54.50

## 2016-08-10 NOTE — Assessment & Plan Note (Signed)
hgba1c acceptable, minimize simple carbs. Increase exercise as tolerated.  

## 2016-08-10 NOTE — Assessment & Plan Note (Signed)
Encouraged moist heat and gentle stretching as tolerated. May try NSAIDs and prescription meds as directed and report if symptoms worsen or seek immediate care. Xray confirms chronic degenerative changes and she is given rx for Tizanidine and should add topical treatments. Report if no improvement for further referral.

## 2016-08-10 NOTE — Assessment & Plan Note (Signed)
Encouraged heart healthy diet, increase exercise, avoid trans fats, consider a krill oil cap daily 

## 2016-08-12 ENCOUNTER — Ambulatory Visit (INDEPENDENT_AMBULATORY_CARE_PROVIDER_SITE_OTHER): Payer: Medicare HMO | Admitting: Neurology

## 2016-08-12 ENCOUNTER — Encounter: Payer: Self-pay | Admitting: Neurology

## 2016-08-12 VITALS — BP 142/88 | HR 96 | Ht 63.0 in | Wt 145.4 lb

## 2016-08-12 DIAGNOSIS — G3184 Mild cognitive impairment, so stated: Secondary | ICD-10-CM

## 2016-08-12 DIAGNOSIS — F419 Anxiety disorder, unspecified: Secondary | ICD-10-CM

## 2016-08-12 DIAGNOSIS — F320 Major depressive disorder, single episode, mild: Secondary | ICD-10-CM | POA: Diagnosis not present

## 2016-08-12 NOTE — Patient Instructions (Addendum)
1. Schedule MRI brain without contrast 2. Refer to Behavioral Medicine for psychiatry and therapy 3. Contact [employer] about getting blister packs and set an alarm for your medications 4. Control of blood pressure, diabetes, cholesterol, as well as physical exercise and brain stimulation exercises are important for brain health 5. Follow-up in 6 months, call for any changes
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
## 2016-08-15 ENCOUNTER — Ambulatory Visit (INDEPENDENT_AMBULATORY_CARE_PROVIDER_SITE_OTHER): Payer: Medicare HMO | Admitting: Family Medicine

## 2016-08-15 VITALS — BP 134/84 | HR 76

## 2016-08-15 DIAGNOSIS — I10 Essential (primary) hypertension: Secondary | ICD-10-CM

## 2016-08-15 NOTE — Patient Instructions (Addendum)
Per Dr. Charlett Blake: Continue current medication & regimen. Keep follow-up appointment with PCP on 10/06/16 at 2:00 PM.

## 2016-08-15 NOTE — Progress Notes (Signed)
RN blood pressure check note reviewed. Agree with documention and plan. 

## 2016-08-15 NOTE — Progress Notes (Signed)
Pre visit review using our clinic review tool, if applicable. No additional management support is needed unless otherwise documented below in the visit note.  Patient came in office for blood pressure check. Verbal order given by Dr. Charlett Blake. Reviewed medication & regimen with the patient. Today's readings were as follow: BP 142/90 P 70 & 134/84 P 76.  Per Dr. Charlett Blake: Continue current medication & regimen. Keep follow-up appointment with PCP on 10/06/16 at 2:00 PM.  Informed patient of the provider's recommendations. She verbalized understanding and did not have any further concerns before leaving the nurse visit.

## 2016-08-19 ENCOUNTER — Ambulatory Visit
Admission: RE | Admit: 2016-08-19 | Discharge: 2016-08-19 | Disposition: A | Discharge status: Home / Self Care | Payer: Medicare HMO | Source: Ambulatory Visit | Attending: Neurology | Admitting: Neurology

## 2016-08-19 DIAGNOSIS — H7291 Unspecified perforation of tympanic membrane, right ear: Secondary | ICD-10-CM | POA: Diagnosis not present

## 2016-08-19 DIAGNOSIS — F320 Major depressive disorder, single episode, mild: Secondary | ICD-10-CM

## 2016-08-19 DIAGNOSIS — R410 Disorientation, unspecified: Secondary | ICD-10-CM | POA: Diagnosis not present

## 2016-08-19 DIAGNOSIS — F419 Anxiety disorder, unspecified: Secondary | ICD-10-CM

## 2016-08-19 DIAGNOSIS — H90A31 Mixed conductive and sensorineural hearing loss, unilateral, right ear with restricted hearing on the contralateral side: Secondary | ICD-10-CM | POA: Diagnosis not present

## 2016-08-19 DIAGNOSIS — G3184 Mild cognitive impairment, so stated: Secondary | ICD-10-CM

## 2016-08-20 ENCOUNTER — Telehealth: Payer: Self-pay

## 2016-08-20 NOTE — Telephone Encounter (Signed)
Per Dr. Delice Lesch patient notified MRI brain is normal, no evidence of tumor, stroke, or bleed.  Patient verbalized understanding.

## 2016-08-21 ENCOUNTER — Encounter: Payer: Self-pay | Admitting: Neurology

## 2016-08-21 DIAGNOSIS — F419 Anxiety disorder, unspecified: Secondary | ICD-10-CM | POA: Insufficient documentation

## 2016-08-21 DIAGNOSIS — F320 Major depressive disorder, single episode, mild: Secondary | ICD-10-CM | POA: Insufficient documentation

## 2016-08-21 DIAGNOSIS — G3184 Mild cognitive impairment, so stated: Secondary | ICD-10-CM | POA: Insufficient documentation

## 2016-08-28 ENCOUNTER — Ambulatory Visit (INDEPENDENT_AMBULATORY_CARE_PROVIDER_SITE_OTHER): Payer: Medicare HMO | Admitting: Family Medicine

## 2016-08-28 ENCOUNTER — Telehealth: Payer: Self-pay | Admitting: Family Medicine

## 2016-08-28 ENCOUNTER — Encounter: Payer: Self-pay | Admitting: Family Medicine

## 2016-08-28 VITALS — BP 132/78 | HR 72 | Temp 98.2°F | Ht 62.0 in | Wt 146.2 lb

## 2016-08-28 DIAGNOSIS — J4521 Mild intermittent asthma with (acute) exacerbation: Secondary | ICD-10-CM

## 2016-08-28 DIAGNOSIS — J0100 Acute maxillary sinusitis, unspecified: Secondary | ICD-10-CM

## 2016-08-28 MED ORDER — BENZONATATE 100 MG PO CAPS: 100.0000 mg | ORAL_CAPSULE | Freq: Three times a day (TID) | ORAL | 0 refills | Status: DC | PRN

## 2016-08-28 MED ORDER — AMOXICILLIN-POT CLAVULANATE 875-125 MG PO TABS: 1.0000 | ORAL_TABLET | Freq: Two times a day (BID) | ORAL | 0 refills | Status: DC

## 2016-08-28 MED ORDER — FLUTICASONE PROPIONATE HFA 110 MCG/ACT IN AERO: 1.0000 | INHALATION_SPRAY | Freq: Two times a day (BID) | RESPIRATORY_TRACT | 12 refills | Status: DC

## 2016-08-28 MED FILL — BENZONATATE 100 MG CAP: 100 | 10 days supply | Qty: 28 | Fill #0

## 2016-08-28 MED FILL — AMOX-CLAV 875-125 MG TABLET: 875-125 | 10 days supply | Qty: 20 | Fill #0

## 2016-08-28 NOTE — Patient Instructions (Signed)
Remember to rinse your mouth out after using your inhaler (Flovent).  The cough pills are available cheaper at [facility].   Before taking the antibiotic (Augmentin), take the inhaler and cough pill. If you are still having issues in 3 days (Sunday), OK to take it.  Continue to push fluids, practice good hand hygiene, and cover your mouth if you cough.  If you start having fevers, shaking or shortness of breath, seek immediate care.  Flonase may also be helpful for your congestion.

## 2016-08-28 NOTE — Progress Notes (Signed)
Chief Complaint  Patient presents with  . Influenza    Pt reports congestion , cough, mid grade fever, lack of breathing with inhaler possible due to congestion    Crystal [name] here for URI complaints.  Duration: 7 week  Associated symptoms: sinus congestion, sinus pain, rhinorrhea, shortness of breath and cough Denies: itchy watery eyes, ear pain, ear drainage, sore throat, myalgia and fevers/shaking Treatment to date: Nyquil, albuterol Sick contacts: Yes- grandson  ROS:  Const: Denies fevers HEENT: As noted in HPI Lungs: +SOB  Past Medical History:  Diagnosis Date  . Acute peptic ulcer of stomach    As a teenager  . Allergy    dust, cock roach,grass,weeds,molds  . Anxiety   . Asthma    02/08/10 FEV1 1.98 (80%), s/p saba 2.22 l/m (90%).nml  . Cataract   . Cognitive change 06/10/2016  . Depression   . Diabetes mellitus 2003   Type II  . Diverticulosis   . Dyspepsia   . Fatty liver 12/19/2010  . GERD (gastroesophageal reflux disease)   . High cholesterol   . Hyperlipemia   . Hypertension   . Hypothyroidism 09/08/2013  . IBS (irritable bowel syndrome)   . Internal hemorrhoids   . Joint pain 08/08/2016  . Low back pain 08/10/2016  . OSA (obstructive sleep apnea)   . Plantar fasciitis   . Vitamin D deficiency 03/04/2016   Family History  Problem Relation Age of Onset  . Diabetes Mother     type II  . Heart attack Mother 73  . Stroke Mother   . Colon cancer Sister   . Asthma Maternal Grandmother   . Mental retardation Son     PTSD  . Parkinsonism Father   . Cancer Sister     stage 4 bladder cancer  . Colon polyps Sister   . Ulcers      bleeding gastric  . Heart defect      child  . Other      perforated bowel, child    BP 132/78 (BP Location: Left Arm, Patient Position: Sitting, Cuff Size: Small)   Pulse 72   Temp 98.2 F (36.8 C) (Oral)   Ht 5\' 2"  (1.575 m)   Wt 146 lb 3.2 oz (66.3 kg)   SpO2 100%   BMI 26.74 kg/m  General: Awake, alert,  appears stated age HEENT: AT, [place], ears patent b/l and TM's neg, nares patent w/o discharge, +b/l max sinus tenderness, pharynx pink and without exudates, MMM Neck: No masses or asymmetry Heart: RRR, no murmurs, no bruits Lungs: CTAB, no accessory muscle use Psych: Age appropriate judgment and insight, normal mood and affect  Acute non-recurrent maxillary sinusitis - Plan: benzonatate (TESSALON) 100 MG capsule, amoxicillin-clavulanate (AUGMENTIN) 875-125 MG tablet, DISCONTINUED: amoxicillin-clavulanate (AUGMENTIN) 875-125 MG tablet, DISCONTINUED: benzonatate (TESSALON) 100 MG capsule  Mild intermittent asthma with acute exacerbation - Plan: fluticasone (FLOVENT HFA) 110 MCG/ACT inhaler  Orders as above. Supportive care for next 3 days. If no improvement, OK to take abx. Would consider pt in a Yellow Zone with her asthma, rx'd ICS as PO steroids likely to be necessary given lack of wheeze. Continue to push fluids, practice good hand hygiene, cover mouth when coughing. F/u prn. If starting to experience fevers, shaking, or shortness of breath, seek immediate care. Pt voiced understanding and agreement to the plan.  [place], DO 08/28/16 1:52 PM

## 2016-08-28 NOTE — Telephone Encounter (Signed)
Caller name: Ragan Relation to pt: self Call back number: [phone] Pharmacy: [place], [place]  Reason for call: Pt states was seen today and has a prescription sent to pharmacy for fluticasone (FLOVENT HFA) 110 MCG/ACT inhaler, pt states went to pharmacy and the charge would be $200. And something dolars, pt states please to send prescription for Flovent to [facility], since [facility] will cover it for her. Please advise.

## 2016-08-28 NOTE — Progress Notes (Signed)
Pre visit review using our clinic review tool, if applicable. No additional management support is needed unless otherwise documented below in the visit note. 

## 2016-08-29 MED ORDER — FLUTICASONE PROPIONATE HFA 110 MCG/ACT IN AERO: 1.0000 | INHALATION_SPRAY | Freq: Two times a day (BID) | RESPIRATORY_TRACT | 12 refills | Status: DC

## 2016-08-29 NOTE — Telephone Encounter (Signed)
Rx has been sent to [facility] service per patient request. TL/CMA

## 2016-09-04 ENCOUNTER — Telehealth: Payer: Self-pay | Admitting: Neurology

## 2016-09-04 DIAGNOSIS — G3184 Mild cognitive impairment, so stated: Secondary | ICD-10-CM

## 2016-09-04 NOTE — Telephone Encounter (Signed)
PT left a message in regards to not hering form a referral for a psychiatrist/Dawn CB# [phone]

## 2016-09-05 NOTE — Telephone Encounter (Signed)
Contacted patient with number to schedule psychiatry.

## 2016-09-22 ENCOUNTER — Ambulatory Visit: Payer: Medicare HMO | Admitting: Neurology

## 2016-10-06 ENCOUNTER — Ambulatory Visit (INDEPENDENT_AMBULATORY_CARE_PROVIDER_SITE_OTHER): Payer: Medicare HMO | Admitting: Family Medicine

## 2016-10-06 ENCOUNTER — Encounter: Payer: Self-pay | Admitting: Family Medicine

## 2016-10-06 VITALS — BP 128/82 | HR 68 | Temp 98.2°F | Resp 18 | Ht 63.0 in | Wt 146.6 lb

## 2016-10-06 DIAGNOSIS — Z0000 Encounter for general adult medical examination without abnormal findings: Secondary | ICD-10-CM

## 2016-10-06 DIAGNOSIS — E785 Hyperlipidemia, unspecified: Secondary | ICD-10-CM

## 2016-10-06 DIAGNOSIS — M79674 Pain in right toe(s): Secondary | ICD-10-CM | POA: Diagnosis not present

## 2016-10-06 DIAGNOSIS — E1149 Type 2 diabetes mellitus with other diabetic neurological complication: Secondary | ICD-10-CM | POA: Diagnosis not present

## 2016-10-06 DIAGNOSIS — R32 Unspecified urinary incontinence: Secondary | ICD-10-CM | POA: Diagnosis not present

## 2016-10-06 DIAGNOSIS — M8580 Other specified disorders of bone density and structure, unspecified site: Secondary | ICD-10-CM

## 2016-10-06 DIAGNOSIS — I10 Essential (primary) hypertension: Secondary | ICD-10-CM | POA: Diagnosis not present

## 2016-10-06 DIAGNOSIS — E039 Hypothyroidism, unspecified: Secondary | ICD-10-CM | POA: Diagnosis not present

## 2016-10-06 DIAGNOSIS — K219 Gastro-esophageal reflux disease without esophagitis: Secondary | ICD-10-CM

## 2016-10-06 DIAGNOSIS — Z780 Asymptomatic menopausal state: Secondary | ICD-10-CM

## 2016-10-06 NOTE — Progress Notes (Signed)
Pre visit review using our clinic review tool, if applicable. No additional management support is needed unless otherwise documented below in the visit note. 

## 2016-10-06 NOTE — Progress Notes (Signed)
Subjective:   Crystal [name] is a 66 y.o. female who presents for Medicare Annual (Subsequent) preventive examination.  Review of Systems:  No ROS.  Medicare Wellness Visit.  Cardiac Risk Factors include: advanced age (>1mn, >>63women);diabetes mellitus;dyslipidemia  Sleep patterns: has restless sleep and gets up 1-2 times nightly to void.   Home Safety/Smoke Alarms: Feels safe in home. Smoke alarms in place.   Living environment; residence and Firearm Safety: Currently lives w/ daughter and 2 grandsons, but is planning to move into a townhouse with a friend in the next few months. She is excited about this move and is looking forward to having some personal space again. Currently living in a  2-story house, number of inside stairs: 1 flight. Seat Belt Safety/Bike Helmet: Wears seat belt.   Counseling:   Eye Exam- Dr. DBing Plumeyearly. Wears reading glasses.  Dental- [facility] twice yearly. Dentures upper and partial plate lower.   Female:   Pap- N/A, aged out.       Mammo- last 04/14/16. BI-RADS CATEGORY  1: Negative.        Dexa scan- last 09/15/13. Osteopenia.         CCS- last 05/19/16. diverticulosis, internal hemorrhoids. 5 year recall.     Objective:     Vitals: BP 128/82   Pulse 68   Temp 98.2 F (36.8 C) (Oral)   Resp 18   Ht '5\' 3"'  (1.6 m)   Wt 146 lb 9.6 oz (66.5 kg)   SpO2 98%   BMI 25.97 kg/m   Body mass index is 25.97 kg/m.   Tobacco History  Smoking Status  . Never Smoker  Smokeless Tobacco  . Never Used     Counseling given: Not Answered   Past Medical History:  Diagnosis Date  . Acute peptic ulcer of stomach    As a teenager  . Allergy    dust, cock roach,grass,weeds,molds  . Anxiety   . Asthma    02/08/10 FEV1 1.98 (80%), s/p saba 2.22 l/m (90%).nml  . Cataract   . Cognitive change 06/10/2016  . Depression   . Diabetes mellitus 2003   Type II  . Diverticulosis   . Dyspepsia   . Fatty liver 12/19/2010  . GERD  (gastroesophageal reflux disease)   . High cholesterol   . Hyperlipemia   . Hypertension   . Hypothyroidism 09/08/2013  . IBS (irritable bowel syndrome)   . Internal hemorrhoids   . Joint pain 08/08/2016  . Low back pain 08/10/2016  . OSA (obstructive sleep apnea)   . Plantar fasciitis   . Vitamin D deficiency 03/04/2016   Past Surgical History:  Procedure Laterality Date  . APPENDECTOMY     age 89 . CATARACT EXTRACTION, BILATERAL     12/8 and 12/14  . COLONOSCOPY  06/03/2010, 08/25/2011   diverticulosis   . GASTRECTOMY     partial - ulcer as a teen  . HAND SURGERY Right   . HEMORRHOID BANDING    . KNEE ARTHROSCOPY Left 1998  . NASAL SEPTUM SURGERY    . RHINOPLASTY    . TOE SURGERY Right 1995   Family History  Problem Relation Age of Onset  . Diabetes Mother     type II  . Heart attack Mother 571 . Stroke Mother   . Colon cancer Sister   . Asthma Maternal Grandmother   . Mental retardation Son     PTSD  . Parkinsonism Father   . Cancer  Sister     stage 4 bladder cancer  . Colon polyps Sister   . Ulcers      bleeding gastric  . Heart defect      child  . Other      perforated bowel, child   History  Sexual Activity  . Sexual activity: No    Comment: lives with her mother, no dietary restrictions    Outpatient Encounter Prescriptions as of 10/06/2016  Medication Sig  . ACCU-CHEK SOFTCLIX LANCETS lancets Use as directed twice daily to check blood sugar. DX E11.9  . Alcohol Swabs (B-D SINGLE USE SWABS REGULAR) PADS Use as directed to check blood sugar. DX E11.9  . atorvastatin (LIPITOR) 80 MG tablet TAKE 1 TABLET (80 MG) BY MOUTH DAILY.  Marland Kitchen Blood Glucose Calibration (ACCU-CHEK AVIVA) SOLN Use as directed to check blood sugar.  DX E11.9  . Blood Glucose Monitoring Suppl (ACCU-CHEK AVIVA PLUS) w/Device KIT Use as directed to check blood sugar.  DX E11.9  . Cholecalciferol (VITAMIN D3) 2000 units TABS Take 1 tablet by mouth daily.  . diazepam (VALIUM) 5 MG tablet  Take 0.5 to 1 tablet by mouth twice daily as needed for anxiety  . escitalopram (LEXAPRO) 10 MG tablet Take 1 tablet (10 mg total) by mouth daily.  . fenofibrate 160 MG tablet Take 1 tablet (160 mg total) by mouth daily. (Patient taking differently: Take 160 mg by mouth at bedtime. )  . fluticasone (FLOVENT HFA) 110 MCG/ACT inhaler Inhale 1 puff into the lungs 2 (two) times daily. (Patient taking differently: Inhale 1 puff into the lungs 2 (two) times daily as needed. )  . glimepiride (AMARYL) 2 MG tablet Take 2 mg by mouth 2 (two) times daily.  Marland Kitchen glucose blood (ACCU-CHEK AVIVA PLUS) test strip Use as directed twice daily to check blood sugar.  DX E11.9  . HYDROcodone-acetaminophen (NORCO/VICODIN) 5-325 MG tablet Take one by mouth at bedtime as needed for pain  . levothyroxine (SYNTHROID, LEVOTHROID) 25 MCG tablet Take 1 tablet (25 mcg total) by mouth daily before breakfast.  . losartan (COZAAR) 25 MG tablet Take 1 tablet (25 mg total) by mouth daily.  . Multiple Vitamins-Minerals (CENTRUM SILVER PO) Take 1 tablet by mouth daily.    . pantoprazole (PROTONIX) 40 MG tablet Take 1 tablet (40 mg total) by mouth daily.  . Probiotic Product (ALIGN) 4 MG CAPS Take 1 capsule by mouth daily. Reported on 07/02/2015  . valACYclovir (VALTREX) 1000 MG tablet TAKE 1 TABLET (1,000 MG) BY MOUTH 2 TIMES A DAY (Patient taking differently: TAKE 1 TABLET (1,000 MG) BY MOUTH 2 TIMES A DAY PRN)  . VENTOLIN HFA 108 (90 Base) MCG/ACT inhaler INHALE 2 PUFFS BY MOUTH INTO THE LUNGS EVERY 4 (FOUR) HOURS AS NEEDED FOR SHORTNESS OF BREATH AND WHEEZING  . [DISCONTINUED] amoxicillin-clavulanate (AUGMENTIN) 875-125 MG tablet Take 1 tablet by mouth 2 (two) times daily. (Patient not taking: Reported on 10/06/2016)  . [DISCONTINUED] benzonatate (TESSALON) 100 MG capsule Take 1 capsule (100 mg total) by mouth 3 (three) times daily as needed for cough. (Patient not taking: Reported on 10/06/2016)  . [DISCONTINUED] tiZANidine (ZANAFLEX)  tablet 2 mg    No facility-administered encounter medications on file as of 10/06/2016.     Activities of Daily Living In your present state of health, do you have any difficulty performing the following activities: 10/06/2016 08/08/2016  Hearing? Tempie Donning  Vision? N N  Difficulty concentrating or making decisions? N N  Walking or climbing stairs?  N N  Dressing or bathing? N N  Doing errands, shopping? N N  Preparing Food and eating ? N -  Using the Toilet? N -  In the past six months, have you accidently leaked urine? Y -  Do you have problems with loss of bowel control? N -  Managing your Medications? N -  Managing your Finances? N -  Housekeeping or managing your Housekeeping? N -  Some recent data might be hidden    Patient Care Team: [name], MD as PCP - General (Family Medicine) Calvert Cantor, MD as Consulting Physician (Ophthalmology) Gatha Mayer, MD as Consulting Physician (Gastroenterology) Malachi Pro, MD as Referring Physician (Otolaryngology) Dene Gentry, MD as Consulting Physician (Sports Medicine) Rigoberto Noel, MD as Consulting Physician (Pulmonary Disease) Myeong Roxine Caddy, DPM as Consulting Physician (Podiatry) Cameron Sprang, MD as Consulting Physician (Neurology) C. Mammie Lorenzo, MD as Consulting Physician (Otolaryngology)    Assessment:    Physical assessment deferred to PCP.  Exercise Activities and Dietary recommendations Current Exercise Habits: Structured exercise class, Type of exercise: Other - see comments (water aerobics, [employer]neaker aerobics), Frequency (Times/Week): 3, Intensity: Moderate  Diet (meal preparation, eat out, water intake, caffeinated beverages, dairy products, fruits and vegetables): in general, a "healthy" diet  , well balanced, on average, 3 meals per day. Eats most meals at home. Meals vary. Drinks a lot of unsweet iced tea. Drinks water throughout the day. Lunch today was leftover fish w/ shredded crab  meat w/ steamed veggies.       Goals    . HEMOGLOBIN A1C < 7.0    . Prevent Falls    . Weight (lb) < 135 lb (61.2 kg)      Fall Risk Fall Risk  10/06/2016 08/12/2016 08/08/2016 07/13/2015 07/02/2015  Falls in the past year? No Yes No No Yes  Number falls in past yr: - 2 or more - - 2 or more  Injury with Fall? - No - - -  Risk Factor Category  - - - - High Fall Risk  Follow up - - - - Education provided;Falls prevention discussed   Depression Screen PHQ 2/9 Scores 10/06/2016 08/08/2016 07/13/2015 07/02/2015  PHQ - 2 Score 1 0 0 1     Cognitive Function MMSE - Mini Mental State Exam 10/06/2016 07/02/2015  Orientation to time 4 5  Orientation to Place 5 5  Registration 3 3  Attention/ Calculation 5 5  Recall 3 1  Language- name 2 objects 2 2  Language- repeat 1 1  Language- follow 3 step command 3 3  Language- read & follow direction 1 1  Write a sentence 1 1  Copy design 1 1  Total score 29 28   Montreal Cognitive Assessment  08/12/2016  Visuospatial/ Executive (0/5) 5  Naming (0/3) 3  Attention: Read list of digits (0/2) 2  Attention: Read list of letters (0/1) 1  Attention: Serial 7 subtraction starting at 100 (0/3) 3  Language: Repeat phrase (0/2) 2  Language : Fluency (0/1) 0  Abstraction (0/2) 2  Delayed Recall (0/5) 1  Orientation (0/6) 6  Total 25      Immunization History  Administered Date(s) Administered  . Influenza Split 05/26/2011, 03/31/2012  . Influenza Whole 03/27/2010  . Influenza,inj,Quad PF,36+ Mos 03/29/2013, 04/13/2014, 04/02/2015, 04/23/2016  . Pneumococcal Conjugate-13 04/25/2013  . Pneumococcal Polysaccharide-23 07/14/2004, 08/03/2015  . Zoster 12/27/2013   Screening Tests Health Maintenance  Topic Date Due  .  OPHTHALMOLOGY EXAM  05/06/2016  . HEMOGLOBIN A1C  11/30/2016  . FOOT EXAM  10/06/2017  . MAMMOGRAM  04/14/2018  . COLONOSCOPY  05/19/2021  . TETANUS/TDAP  09/10/2021  . INFLUENZA VACCINE  Completed  . DEXA SCAN  Completed    . Hepatitis C Screening  Completed  . PNA vac Low Risk Adult  Completed      Plan:    Follow-up w/ PCP as directed.  Eat heart healthy diet (full of fruits, vegetables, whole grains, lean protein, water--limit salt, fat, and sugar intake) and increase physical activity as tolerated.  Copy of last diabetic eye exam requested from Dr. Rachael Fee office.  During the course of the visit the patient was educated and counseled about the following appropriate screening and preventive services:   Vaccines to include Pneumoccal, Influenza,Td, HCV  Cardiovascular Disease  Colorectal cancer screening  Bone density screening  Diabetes screening  Glaucoma screening  Mammography  Nutrition counseling   Patient Instructions (the written plan) was given to the patient.   Dorrene German, RN  10/06/2016

## 2016-10-06 NOTE — Assessment & Plan Note (Signed)
Encouraged heart healthy diet, increase exercise, avoid trans fats, consider a krill oil cap daily 

## 2016-10-06 NOTE — Patient Instructions (Addendum)
Create and bring a copy of your advance directives to your next office visit. Preventive Care 68 Years and Older, Female Preventive care refers to lifestyle choices and visits with your health care provider that can promote health and wellness. What does preventive care include?  A yearly physical exam. This is also called an annual well check.  Dental exams once or twice a year.  Routine eye exams. Ask your health care provider how often you should have your eyes checked.  Personal lifestyle choices, including:  Daily care of your teeth and gums.  Regular physical activity.  Eating a healthy diet.  Avoiding tobacco and drug use.  Limiting alcohol use.  Practicing safe sex.  Taking low-dose aspirin every day.  Taking vitamin and mineral supplements as recommended by your health care provider. What happens during an annual well check? The services and screenings done by your health care provider during your annual well check will depend on your age, overall health, lifestyle risk factors, and family history of disease. Counseling  Your health care provider may ask you questions about your:  Alcohol use.  Tobacco use.  Drug use.  Emotional well-being.  Home and relationship well-being.  Sexual activity.  Eating habits.  History of falls.  Memory and ability to understand (cognition).  Work and work environment.  Reproductive health. Screening  You may have the following tests or measurements:  Height, weight, and BMI.  Blood pressure.  Lipid and cholesterol levels. These may be checked every 5 years, or more frequently if you are over 50 years old.  Skin check.  Lung cancer screening. You may have this screening every year starting at age 55 if you have a 30-pack-year history of smoking and currently smoke or have quit within the past 15 years.  Fecal occult blood test (FOBT) of the stool. You may have this test every year starting at age  50.  Flexible sigmoidoscopy or colonoscopy. You may have a sigmoidoscopy every 5 years or a colonoscopy every 10 years starting at age 50.  Hepatitis C blood test.  Hepatitis B blood test.  Sexually transmitted disease (STD) testing.  Diabetes screening. This is done by checking your blood sugar (glucose) after you have not eaten for a while (fasting). You may have this done every 1-3 years.  Bone density scan. This is done to screen for osteoporosis. You may have this done starting at age 65.  Mammogram. This may be done every 1-2 years. Talk to your health care provider about how often you should have regular mammograms. Talk with your health care provider about your test results, treatment options, and if necessary, the need for more tests. Vaccines  Your health care provider may recommend certain vaccines, such as:  Influenza vaccine. This is recommended every year.  Tetanus, diphtheria, and acellular pertussis (Tdap, Td) vaccine. You may need a Td booster every 10 years.  Varicella vaccine. You may need this if you have not been vaccinated.  Zoster vaccine. You may need this after age 60.  Measles, mumps, and rubella (MMR) vaccine. You may need at least one dose of MMR if you were born in 1957 or later. You may also need a second dose.  Pneumococcal 13-valent conjugate (PCV13) vaccine. One dose is recommended after age 65.  Pneumococcal polysaccharide (PPSV23) vaccine. One dose is recommended after age 65.  Meningococcal vaccine. You may need this if you have certain conditions.  Hepatitis A vaccine. You may need this if you have certain conditions   or if you travel or work in places where you may be exposed to hepatitis A.  Hepatitis B vaccine. You may need this if you have certain conditions or if you travel or work in places where you may be exposed to hepatitis B.  Haemophilus influenzae type b (Hib) vaccine. You may need this if you have certain conditions. Talk to  your health care provider about which screenings and vaccines you need and how often you need them. This information is not intended to replace advice given to you by your health care provider. Make sure you discuss any questions you have with your health care provider. Document Released: 07/27/2015 Document Revised: 03/19/2016 Document Reviewed: 05/01/2015 Elsevier Interactive Patient Education  2017 [employer].

## 2016-10-06 NOTE — Assessment & Plan Note (Signed)
Well controlled, no changes to meds. Encouraged heart healthy diet such as the DASH diet and exercise as tolerated.  °

## 2016-10-06 NOTE — Assessment & Plan Note (Signed)
hgba1c acceptable, minimize simple carbs. Increase exercise as tolerated. Continue current meds 

## 2016-10-06 NOTE — Progress Notes (Signed)
Patient ID: Carl Bleecker, female   DOB: [birth year], 66 y.o.   MRN: [id number]   Subjective:  I acted as a Education administrator for Penni Homans, [place], [place]   Patient ID: [name], female    DOB: [birth year], 66 y.o.   MRN: [id number]  Chief Complaint  Patient presents with  . Medicare Wellness    with RN.  Marland Kitchen Hypertension  . Urinary Incontinence    States when the urge hits her to urinate, if she is wearing a belt, she cannot get it unbuckled fast enough and has incontinence. Also has incontinence when coughing, laughing, or sneezing.  . Diabetes    Checking CBGs 1-2x weekly. Average CBG 107. Reports compliance w/ medication.  . Sleep Apnea    Previously on CPAP, but machine broke and she has not followed-up w/ pulm for new sleep study/supplies.    Hypertension  This is a chronic problem. The problem is unchanged. Pertinent negatives include no blurred vision, chest pain, headaches, malaise/fatigue, palpitations or shortness of breath.    Patient is in today for a Medicare Wellness visit with the Health Coach to be followed up with the Provider. States that she has ongoing concerns with incontinence. Patient has a Hx of HTN, GERD, Type II Diabetes. Patient has no additional acute concerns noted at this time. She stays active and is trying to maintain a heart healthy diet. Denies CP/palp/SOB/HA/congestion/fevers/GI or GU c/o. Taking meds as prescribed  Patient Care Team: [name], MD as PCP - General (Family Medicine) Calvert Cantor, MD as Consulting Physician (Ophthalmology) Gatha Mayer, MD as Consulting Physician (Gastroenterology) Malachi Pro, MD as Referring Physician (Otolaryngology) Dene Gentry, MD as Consulting Physician (Sports Medicine) Rigoberto Noel, MD as Consulting Physician (Pulmonary Disease) Myeong Roxine Caddy, DPM as Consulting Physician (Podiatry) Cameron Sprang, MD as Consulting Physician (Neurology) C. Mammie Lorenzo, MD as  Consulting Physician (Otolaryngology)   Past Medical History:  Diagnosis Date  . Acute peptic ulcer of stomach    As a teenager  . Allergy    dust, cock roach,grass,weeds,molds  . Anxiety   . Asthma    02/08/10 FEV1 1.98 (80%), s/p saba 2.22 l/m (90%).nml  . Cataract   . Cognitive change 06/10/2016  . Depression   . Diabetes mellitus 2003   Type II  . Diverticulosis   . Dyspepsia   . Fatty liver 12/19/2010  . GERD (gastroesophageal reflux disease)   . High cholesterol   . Hyperlipemia   . Hypertension   . Hypothyroidism 09/08/2013  . IBS (irritable bowel syndrome)   . Internal hemorrhoids   . Joint pain 08/08/2016  . Low back pain 08/10/2016  . OSA (obstructive sleep apnea)   . Plantar fasciitis   . Vitamin D deficiency 03/04/2016    Past Surgical History:  Procedure Laterality Date  . APPENDECTOMY     age 57  . CATARACT EXTRACTION, BILATERAL     12/8 and 12/14  . COLONOSCOPY  06/03/2010, 08/25/2011   diverticulosis   . GASTRECTOMY     partial - ulcer as a teen  . HAND SURGERY Right   . HEMORRHOID BANDING    . KNEE ARTHROSCOPY Left 1998  . NASAL SEPTUM SURGERY    . RHINOPLASTY    . TOE SURGERY Right 1995    Family History  Problem Relation Age of Onset  . Diabetes Mother     type II  . Heart attack Mother 46  . Stroke  Mother   . Colon cancer Sister   . Asthma Maternal Grandmother   . Mental retardation Son     PTSD  . Parkinsonism Father   . Cancer Sister     stage 4 bladder cancer  . Colon polyps Sister   . Ulcers      bleeding gastric  . Heart defect      child  . Other      perforated bowel, child    Social History   Social History  . Marital status: Married    Spouse name: [name]  . Number of children: 4  . Years of education: N/A   Occupational History  . retired Orthoptist   Social History Main Topics  . Smoking status: Never Smoker  . Smokeless tobacco: Never Used  . Alcohol use No  . Drug use: No  . Sexual activity: No     Comment:  lives with her mother, no dietary restrictions   Other Topics Concern  . Not on file   Social History Narrative   No caffeine drinks daily     Outpatient Medications Prior to Visit  Medication Sig Dispense Refill  . ACCU-CHEK SOFTCLIX LANCETS lancets Use as directed twice daily to check blood sugar. DX E11.9 200 each 3  . Alcohol Swabs (B-D SINGLE USE SWABS REGULAR) PADS Use as directed to check blood sugar. DX E11.9 100 each 3  . atorvastatin (LIPITOR) 80 MG tablet TAKE 1 TABLET (80 MG) BY MOUTH DAILY. 90 tablet 2  . Blood Glucose Calibration (ACCU-CHEK AVIVA) SOLN Use as directed to check blood sugar.  DX E11.9 1 each 3  . Blood Glucose Monitoring Suppl (ACCU-CHEK AVIVA PLUS) w/Device KIT Use as directed to check blood sugar.  DX E11.9 1 kit 0  . Cholecalciferol (VITAMIN D3) 2000 units TABS Take 1 tablet by mouth daily.    . diazepam (VALIUM) 5 MG tablet Take 0.5 to 1 tablet by mouth twice daily as needed for anxiety 40 tablet 2  . escitalopram (LEXAPRO) 10 MG tablet Take 1 tablet (10 mg total) by mouth daily. 90 tablet 2  . fenofibrate 160 MG tablet Take 1 tablet (160 mg total) by mouth daily. (Patient taking differently: Take 160 mg by mouth at bedtime. ) 90 tablet 2  . fluticasone (FLOVENT HFA) 110 MCG/ACT inhaler Inhale 1 puff into the lungs 2 (two) times daily. (Patient taking differently: Inhale 1 puff into the lungs 2 (two) times daily as needed. ) 1 Inhaler 12  . glimepiride (AMARYL) 2 MG tablet Take 2 mg by mouth 2 (two) times daily.    Marland Kitchen glucose blood (ACCU-CHEK AVIVA PLUS) test strip Use as directed twice daily to check blood sugar.  DX E11.9 200 each 3  . HYDROcodone-acetaminophen (NORCO/VICODIN) 5-325 MG tablet Take one by mouth at bedtime as needed for pain 10 tablet 0  . levothyroxine (SYNTHROID, LEVOTHROID) 25 MCG tablet Take 1 tablet (25 mcg total) by mouth daily before breakfast. 90 tablet 2  . losartan (COZAAR) 25 MG tablet Take 1 tablet (25 mg total) by mouth daily. 90  tablet 2  . Multiple Vitamins-Minerals (CENTRUM SILVER PO) Take 1 tablet by mouth daily.      . pantoprazole (PROTONIX) 40 MG tablet Take 1 tablet (40 mg total) by mouth daily. 90 tablet 1  . Probiotic Product (ALIGN) 4 MG CAPS Take 1 capsule by mouth daily. Reported on 07/02/2015    . valACYclovir (VALTREX) 1000 MG tablet TAKE 1 TABLET (1,000 MG) BY  MOUTH 2 TIMES A DAY (Patient taking differently: TAKE 1 TABLET (1,000 MG) BY MOUTH 2 TIMES A DAY PRN) 60 tablet 0  . VENTOLIN HFA 108 (90 Base) MCG/ACT inhaler INHALE 2 PUFFS BY MOUTH INTO THE LUNGS EVERY 4 (FOUR) HOURS AS NEEDED FOR SHORTNESS OF BREATH AND WHEEZING 18 g 0  . amoxicillin-clavulanate (AUGMENTIN) 875-125 MG tablet Take 1 tablet by mouth 2 (two) times daily. (Patient not taking: Reported on 10/06/2016) 20 tablet 0  . benzonatate (TESSALON) 100 MG capsule Take 1 capsule (100 mg total) by mouth 3 (three) times daily as needed for cough. (Patient not taking: Reported on 10/06/2016) 28 capsule 0  . tiZANidine (ZANAFLEX) tablet 2 mg      No facility-administered medications prior to visit.     Allergies  Allergen Reactions  . Compazine [Prochlorperazine Edisylate]     Comma for 3 days  . Tetanus Toxoid Other (See Comments)    convulsions  . Sulfa Antibiotics Other (See Comments)    Unknown    Review of Systems  Constitutional: Negative for fever and malaise/fatigue.  HENT: Negative for congestion.   Eyes: Negative for blurred vision.  Respiratory: Negative for cough and shortness of breath.   Cardiovascular: Negative for chest pain, palpitations and leg swelling.  Gastrointestinal: Negative for vomiting.  Musculoskeletal: Negative for back pain.  Skin: Negative for rash.  Neurological: Negative for loss of consciousness and headaches.       Objective:    Physical Exam  Constitutional: She is oriented to person, place, and time. She appears well-developed and well-nourished. No distress.  HENT:  Head: Normocephalic and  atraumatic.  Eyes: Conjunctivae are normal.  Neck: Normal range of motion. No thyromegaly present.  Cardiovascular: Normal rate and regular rhythm.   Pulmonary/Chest: Effort normal and breath sounds normal. She has no wheezes.  Abdominal: Soft. Bowel sounds are normal. There is no tenderness.  Musculoskeletal: She exhibits no edema or deformity.  Neurological: She is alert and oriented to person, place, and time.  Skin: Skin is warm and dry. She is not diaphoretic.  Psychiatric: She has a normal mood and affect.    BP 128/82   Pulse 68   Temp 98.2 F (36.8 C) (Oral)   Resp 18   Ht _0  (1.6 m)   Wt 146 lb 9.6 oz (66.5 kg)   SpO2 98%   BMI 25.97 kg/m  Wt Readings from Last 3 Encounters:  10/06/16 146 lb 9.6 oz (66.5 kg)  08/28/16 146 lb 3.2 oz (66.3 kg)  08/12/16 145 lb 6 oz (65.9 kg)   BP Readings from Last 3 Encounters:  10/06/16 128/82  08/28/16 132/78  08/15/16 134/84     Immunization History  Administered Date(s) Administered  . Influenza Split 05/26/2011, 03/31/2012  . Influenza Whole 03/27/2010  . Influenza,inj,Quad PF,36+ Mos 03/29/2013, 04/13/2014, 04/02/2015, 04/23/2016  . Pneumococcal Conjugate-13 04/25/2013  . Pneumococcal Polysaccharide-23 07/14/2004, 08/03/2015  . Zoster 12/27/2013    Health Maintenance  Topic Date Due  . OPHTHALMOLOGY EXAM  05/06/2016  . HEMOGLOBIN A1C  04/08/2017  . FOOT EXAM  10/06/2017  . MAMMOGRAM  04/14/2018  . COLONOSCOPY  05/19/2021  . TETANUS/TDAP  09/10/2021  . INFLUENZA VACCINE  Completed  . DEXA SCAN  Completed  . Hepatitis C Screening  Completed  . PNA vac Low Risk Adult  Completed    Lab Results  Component Value Date   WBC 7.4 10/06/2016   HGB 13.5 10/06/2016   HCT 40.6 10/06/2016  PLT 312.0 10/06/2016   GLUCOSE 107 (H) 10/06/2016   CHOL 139 10/06/2016   TRIG 66.0 10/06/2016   HDL 50.90 10/06/2016   LDLDIRECT 123.0 09/18/2014   LDLCALC 75 10/06/2016   ALT 22 10/06/2016   AST 20 10/06/2016   NA 141  10/06/2016   K 4.8 10/06/2016   CL 106 10/06/2016   CREATININE 0.76 10/06/2016   BUN 18 10/06/2016   CO2 29 10/06/2016   TSH 2.09 10/06/2016   HGBA1C 6.9 (H) 10/06/2016   MICROALBUR 1.3 04/30/2015    Lab Results  Component Value Date   TSH 2.09 10/06/2016   Lab Results  Component Value Date   WBC 7.4 10/06/2016   HGB 13.5 10/06/2016   HCT 40.6 10/06/2016   MCV 91.8 10/06/2016   PLT 312.0 10/06/2016   Lab Results  Component Value Date   NA 141 10/06/2016   K 4.8 10/06/2016   CO2 29 10/06/2016   GLUCOSE 107 (H) 10/06/2016   BUN 18 10/06/2016   CREATININE 0.76 10/06/2016   BILITOT 0.6 10/06/2016   ALKPHOS 43 10/06/2016   AST 20 10/06/2016   ALT 22 10/06/2016   PROT 7.1 10/06/2016   ALBUMIN 4.4 10/06/2016   CALCIUM 9.8 10/06/2016   ANIONGAP 7 01/08/2015   GFR 80.57 10/06/2016   Lab Results  Component Value Date   CHOL 139 10/06/2016   Lab Results  Component Value Date   HDL 50.90 10/06/2016   Lab Results  Component Value Date   LDLCALC 75 10/06/2016   Lab Results  Component Value Date   TRIG 66.0 10/06/2016   Lab Results  Component Value Date   CHOLHDL 3 10/06/2016   Lab Results  Component Value Date   HGBA1C 6.9 (H) 10/06/2016         Assessment & Plan:   Problem List Items Addressed This Visit    DM (diabetes mellitus), type 2 with neurological complications ([place])    [id number] acceptable, minimize simple carbs. Increase exercise as tolerated. Continue current meds      Relevant Orders   Hemoglobin A1c (Completed)   Hyperlipidemia    Encouraged heart healthy diet, increase exercise, avoid trans fats, consider a krill oil cap daily      Relevant Orders   Lipid panel (Completed)   Essential hypertension    Well controlled, no changes to meds. Encouraged heart healthy diet such as the DASH diet and exercise as tolerated.       Relevant Orders   CBC (Completed)   Comprehensive metabolic panel (Completed)   TSH (Completed)   GERD  (gastroesophageal reflux disease)    Avoid offending foods, take probiotics. Do not eat large meals in late evening and consider raising head of bed.       Hypothyroidism   Osteopenia    Encouraged to get adequate exercise, calcium and vitamin d intake       Other Visit Diagnoses    Encounter for Medicare annual wellness exam    -  Primary   Asymptomatic postmenopausal state       Relevant Orders   DG Bone Density (Completed)   Urinary incontinence, unspecified type       Relevant Orders   Ambulatory referral to Urology   Pain in right toe(s)       Relevant Orders   Ambulatory referral to Podiatry      I have discontinued Ms. Balling's benzonatate and amoxicillin-clavulanate. I am also having her maintain her Multiple Vitamins-Minerals (CENTRUM SILVER PO),  ALIGN, diazepam, valACYclovir, losartan, levothyroxine, fenofibrate, atorvastatin, ACCU-CHEK AVIVA PLUS, ACCU-CHEK AVIVA, B-D SINGLE USE SWABS REGULAR, glucose blood, ACCU-CHEK SOFTCLIX LANCETS, pantoprazole, glimepiride, VENTOLIN HFA, HYDROcodone-acetaminophen, escitalopram, Vitamin D3, and fluticasone. We will stop administering tiZANidine.  No orders of the defined types were placed in this encounter.   CMA served as Education administrator during this visit. History, Physical and Plan performed by medical provider. Documentation and orders reviewed and attested to.  Penni Homans, MD

## 2016-10-07 ENCOUNTER — Other Ambulatory Visit (HOSPITAL_BASED_OUTPATIENT_CLINIC_OR_DEPARTMENT_OTHER): Payer: Medicare HMO

## 2016-10-07 LAB — COMPREHENSIVE METABOLIC PANEL
ALBUMIN: 4.4 g/dL (ref 3.5–5.2)
ALK PHOS: 43 U/L (ref 39–117)
ALT: 22 U/L (ref 0–35)
AST: 20 U/L (ref 0–37)
BUN: 18 mg/dL (ref 6–23)
CO2: 29 mEq/L (ref 19–32)
Calcium: 9.8 mg/dL (ref 8.4–10.5)
Chloride: 106 mEq/L (ref 96–112)
Creatinine, Ser: 0.76 mg/dL (ref 0.40–1.20)
GFR: 80.57 mL/min (ref >60.00)
GLUCOSE: 107 mg/dL — AB (ref 70–99)
POTASSIUM: 4.8 meq/L (ref 3.5–5.1)
SODIUM: 141 meq/L (ref 135–145)
TOTAL PROTEIN: 7.1 g/dL (ref 6.0–8.3)
Total Bilirubin: 0.6 mg/dL (ref 0.2–1.2)

## 2016-10-07 LAB — LIPID PANEL
Cholesterol: 139 mg/dL (ref 0–200)
HDL: 50.9 mg/dL (ref >39.00)
LDL Cholesterol: 75 mg/dL (ref 0–99)
NonHDL: 88.27
TRIGLYCERIDES: 66 mg/dL (ref 0.0–149.0)
Total CHOL/HDL Ratio: 3
VLDL: 13.2 mg/dL (ref 0.0–40.0)

## 2016-10-07 LAB — CBC
HEMATOCRIT: 40.6 % (ref 36.0–46.0)
Hemoglobin: 13.5 g/dL (ref 12.0–15.0)
MCHC: 33.2 g/dL (ref 30.0–36.0)
MCV: 91.8 fl (ref 78.0–100.0)
Platelets: 312 10*3/uL (ref 150.0–400.0)
RBC: 4.42 Mil/uL (ref 3.87–5.11)
RDW: 13 % (ref 11.5–15.5)
WBC: 7.4 10*3/uL (ref 4.0–10.5)

## 2016-10-07 LAB — HEMOGLOBIN A1C: HEMOGLOBIN A1C: 6.9 % — AB (ref 4.6–6.5)

## 2016-10-07 LAB — TSH: TSH: 2.09 u[IU]/mL (ref 0.35–4.50)

## 2016-10-08 ENCOUNTER — Ambulatory Visit (HOSPITAL_BASED_OUTPATIENT_CLINIC_OR_DEPARTMENT_OTHER)
Admission: RE | Admit: 2016-10-08 | Discharge: 2016-10-08 | Disposition: A | Discharge status: Home / Self Care | Payer: Medicare HMO | Source: Ambulatory Visit | Attending: Family Medicine | Admitting: Family Medicine

## 2016-10-08 DIAGNOSIS — M8589 Other specified disorders of bone density and structure, multiple sites: Secondary | ICD-10-CM | POA: Diagnosis not present

## 2016-10-08 DIAGNOSIS — Z780 Asymptomatic menopausal state: Secondary | ICD-10-CM | POA: Insufficient documentation

## 2016-10-08 DIAGNOSIS — M85851 Other specified disorders of bone density and structure, right thigh: Secondary | ICD-10-CM | POA: Diagnosis not present

## 2016-10-10 NOTE — Assessment & Plan Note (Signed)
Avoid offending foods, take probiotics. Do not eat large meals in late evening and consider raising head of bed.  

## 2016-10-10 NOTE — Assessment & Plan Note (Signed)
Encouraged to get adequate exercise, calcium and vitamin d intake 

## 2016-10-22 ENCOUNTER — Encounter: Payer: Self-pay | Admitting: Family Medicine

## 2016-10-22 DIAGNOSIS — N3946 Mixed incontinence: Secondary | ICD-10-CM | POA: Diagnosis not present

## 2016-10-22 DIAGNOSIS — R32 Unspecified urinary incontinence: Secondary | ICD-10-CM | POA: Diagnosis not present

## 2016-10-23 DIAGNOSIS — N3946 Mixed incontinence: Secondary | ICD-10-CM | POA: Insufficient documentation

## 2016-10-28 ENCOUNTER — Telehealth: Payer: Self-pay | Admitting: Family Medicine

## 2016-10-28 NOTE — Telephone Encounter (Signed)
Spoke with Dr. Charlett Blake and typed letter for patient; completed and forwarded to patient awaiting in the lobby/SLS 04/17

## 2016-10-28 NOTE — Telephone Encounter (Signed)
Caller name: Deva  Relation to pt: self  Call back number: [phone] Pharmacy:  Reason for call: Pt came in office stating is needing an urgent letter for today mentioning that Jodell Cipro (pt's dog) is a working Magazine features editor, so that way pt does not have to pay a fee for her new appt at [facility] in [place] ($400.00 fee). Please advise. Please fax letter to [facility], pt did not have there fax number but does have there Tel [phone]. Ask for Fax number.

## 2016-11-04 ENCOUNTER — Ambulatory Visit (INDEPENDENT_AMBULATORY_CARE_PROVIDER_SITE_OTHER): Payer: Medicare HMO | Admitting: Family Medicine

## 2016-11-04 VITALS — BP 124/82 | HR 72

## 2016-11-04 DIAGNOSIS — I10 Essential (primary) hypertension: Secondary | ICD-10-CM

## 2016-11-04 NOTE — Progress Notes (Signed)
RN blood pressure check note reviewed. Agree with documention and plan. 

## 2016-11-04 NOTE — Progress Notes (Signed)
Pre visit review using our clinic tool,if applicable. No additional management support is needed unless otherwise documented below in the visit note.   Patient in for BP check per order from Dr. Charlett Blake  Dated 10/22/16 due to patient starting Mybertriq.  BP today = 124/82 P=72  Per Dr. Charlett Blake no changes needed at this time. Return for office visit as scheduled on 12/09/16 for follow up. Patient agreed Nurse blood pressurecheck note reviewed. Agree with documention and plan.  Penni Homans, MD

## 2016-11-05 ENCOUNTER — Ambulatory Visit: Payer: Medicare HMO | Admitting: Neurology

## 2016-11-05 DIAGNOSIS — Z029 Encounter for administrative examinations, unspecified: Secondary | ICD-10-CM

## 2016-11-07 ENCOUNTER — Encounter: Payer: Self-pay | Admitting: Neurology

## 2016-11-11 ENCOUNTER — Ambulatory Visit (HOSPITAL_COMMUNITY): Payer: Medicare HMO | Admitting: Psychiatry

## 2016-11-20 DIAGNOSIS — N3946 Mixed incontinence: Secondary | ICD-10-CM | POA: Diagnosis not present

## 2016-11-28 ENCOUNTER — Telehealth: Payer: Self-pay | Admitting: Family Medicine

## 2016-11-28 ENCOUNTER — Other Ambulatory Visit: Payer: Self-pay | Admitting: Family Medicine

## 2016-11-28 ENCOUNTER — Other Ambulatory Visit (INDEPENDENT_AMBULATORY_CARE_PROVIDER_SITE_OTHER): Payer: Medicare HMO

## 2016-11-28 DIAGNOSIS — E785 Hyperlipidemia, unspecified: Secondary | ICD-10-CM | POA: Diagnosis not present

## 2016-11-28 DIAGNOSIS — E559 Vitamin D deficiency, unspecified: Secondary | ICD-10-CM

## 2016-11-28 DIAGNOSIS — E1149 Type 2 diabetes mellitus with other diabetic neurological complication: Secondary | ICD-10-CM | POA: Diagnosis not present

## 2016-11-28 DIAGNOSIS — I10 Essential (primary) hypertension: Secondary | ICD-10-CM | POA: Diagnosis not present

## 2016-11-28 LAB — HEMOGLOBIN A1C: HEMOGLOBIN A1C: 7 % — AB (ref 4.6–6.5)

## 2016-11-28 LAB — COMPREHENSIVE METABOLIC PANEL
ALBUMIN: 4.3 g/dL (ref 3.5–5.2)
ALT: 20 U/L (ref 0–35)
AST: 16 U/L (ref 0–37)
Alkaline Phosphatase: 46 U/L (ref 39–117)
BILIRUBIN TOTAL: 0.4 mg/dL (ref 0.2–1.2)
BUN: 17 mg/dL (ref 6–23)
CALCIUM: 10.2 mg/dL (ref 8.4–10.5)
CHLORIDE: 108 meq/L (ref 96–112)
CO2: 25 meq/L (ref 19–32)
CREATININE: 0.77 mg/dL (ref 0.40–1.20)
GFR: 79.33 mL/min (ref >60.00)
Glucose, Bld: 90 mg/dL (ref 70–99)
Potassium: 4 mEq/L (ref 3.5–5.1)
SODIUM: 141 meq/L (ref 135–145)
Total Protein: 7.1 g/dL (ref 6.0–8.3)

## 2016-11-28 LAB — LIPID PANEL
CHOLESTEROL: 137 mg/dL (ref 0–200)
HDL: 50 mg/dL (ref >39.00)
LDL Cholesterol: 73 mg/dL (ref 0–99)
NonHDL: 86.56
TRIGLYCERIDES: 69 mg/dL (ref 0.0–149.0)
Total CHOL/HDL Ratio: 3
VLDL: 13.8 mg/dL (ref 0.0–40.0)

## 2016-11-28 LAB — CBC
HCT: 39.3 % (ref 36.0–46.0)
Hemoglobin: 13.2 g/dL (ref 12.0–15.0)
MCHC: 33.7 g/dL (ref 30.0–36.0)
MCV: 91.7 fl (ref 78.0–100.0)
Platelets: 296 10*3/uL (ref 150.0–400.0)
RBC: 4.28 Mil/uL (ref 3.87–5.11)
RDW: 13.6 % (ref 11.5–15.5)
WBC: 5.2 10*3/uL (ref 4.0–10.5)

## 2016-11-28 LAB — VITAMIN D 25 HYDROXY (VIT D DEFICIENCY, FRACTURES): VITD: 42.22 ng/mL (ref 30.00–100.00)

## 2016-11-28 LAB — TSH: TSH: 3.02 u[IU]/mL (ref 0.35–4.50)

## 2016-11-28 MED FILL — valACYclovir HCL 1 GM TABS: 1 | 30 days supply | Qty: 60 | Fill #0

## 2016-11-28 NOTE — Telephone Encounter (Signed)
Patient informed of response. She has been taking daily for the past 2 weeks. That is why she requested this refill She is agreeable to do whatever you wants She sees PCP one week from Tuesday

## 2016-11-28 NOTE — Telephone Encounter (Signed)
Caller name: Madilyn Relationship to patient: self Can be reached:[phone]  Reason for call: Pt requesting referral to GYN. She states she is having more frequent outbreaks but has not been intimate. She doesn't understand why she is having more often.

## 2016-11-28 NOTE — Telephone Encounter (Signed)
Pt informed RX has been sent

## 2016-11-28 NOTE — Telephone Encounter (Signed)
I am happy to refer her to GYN but we also could put her on a daily dose of Valtrex to try and suppress the outbreaks. When outbreaks increase in frequency then sometimes this work

## 2016-11-28 NOTE — Telephone Encounter (Signed)
Pt in office for labs and having an outbreak. She is going out of town Sunday and needing to get meds filled today.

## 2016-11-30 NOTE — Telephone Encounter (Signed)
So if she just started taking it daily it takes a while to know if this is going to suppress her outbreaks. Unless she is miserable she should keep taking it daily and we can discuss at visit. We will plan on keeping her on it daily for next 6 months and even longer if we need to. Please send her in refill x 6 months.

## 2016-12-01 MED ORDER — VALACYCLOVIR HCL 1 G PO TABS: 1000.0000 mg | ORAL_TABLET | Freq: Two times a day (BID) | ORAL | 6 refills | Status: DC

## 2016-12-01 NOTE — Telephone Encounter (Signed)
Sent in refill

## 2016-12-01 NOTE — Telephone Encounter (Signed)
Called left message to call back 

## 2016-12-01 NOTE — Telephone Encounter (Signed)
Patient notified

## 2016-12-02 ENCOUNTER — Other Ambulatory Visit: Payer: Commercial Managed Care - HMO

## 2016-12-03 ENCOUNTER — Ambulatory Visit: Payer: Medicare HMO | Admitting: Neurology

## 2016-12-09 ENCOUNTER — Ambulatory Visit (INDEPENDENT_AMBULATORY_CARE_PROVIDER_SITE_OTHER): Payer: Medicare HMO | Admitting: Family Medicine

## 2016-12-09 ENCOUNTER — Encounter: Payer: Self-pay | Admitting: Family Medicine

## 2016-12-09 ENCOUNTER — Other Ambulatory Visit: Payer: Self-pay | Admitting: Family Medicine

## 2016-12-09 VITALS — BP 126/78 | HR 64 | Temp 98.1°F | Resp 18 | Ht 62.0 in | Wt 148.8 lb

## 2016-12-09 VITALS — BP 136/83 | HR 69 | Ht 62.0 in | Wt 147.0 lb

## 2016-12-09 DIAGNOSIS — M8580 Other specified disorders of bone density and structure, unspecified site: Secondary | ICD-10-CM | POA: Diagnosis not present

## 2016-12-09 DIAGNOSIS — M19041 Primary osteoarthritis, right hand: Secondary | ICD-10-CM | POA: Diagnosis not present

## 2016-12-09 DIAGNOSIS — M19042 Primary osteoarthritis, left hand: Secondary | ICD-10-CM | POA: Diagnosis not present

## 2016-12-09 DIAGNOSIS — E039 Hypothyroidism, unspecified: Secondary | ICD-10-CM

## 2016-12-09 DIAGNOSIS — E1149 Type 2 diabetes mellitus with other diabetic neurological complication: Secondary | ICD-10-CM

## 2016-12-09 DIAGNOSIS — I10 Essential (primary) hypertension: Secondary | ICD-10-CM

## 2016-12-09 DIAGNOSIS — G8929 Other chronic pain: Secondary | ICD-10-CM | POA: Diagnosis not present

## 2016-12-09 DIAGNOSIS — Z0000 Encounter for general adult medical examination without abnormal findings: Secondary | ICD-10-CM | POA: Diagnosis not present

## 2016-12-09 DIAGNOSIS — M25561 Pain in right knee: Secondary | ICD-10-CM

## 2016-12-09 DIAGNOSIS — B009 Herpesviral infection, unspecified: Secondary | ICD-10-CM | POA: Insufficient documentation

## 2016-12-09 DIAGNOSIS — E559 Vitamin D deficiency, unspecified: Secondary | ICD-10-CM

## 2016-12-09 DIAGNOSIS — L578 Other skin changes due to chronic exposure to nonionizing radiation: Secondary | ICD-10-CM | POA: Diagnosis not present

## 2016-12-09 DIAGNOSIS — E785 Hyperlipidemia, unspecified: Secondary | ICD-10-CM

## 2016-12-09 DIAGNOSIS — M79673 Pain in unspecified foot: Secondary | ICD-10-CM

## 2016-12-09 HISTORY — DX: Other skin changes due to chronic exposure to nonionizing radiation: L57.8

## 2016-12-09 MED ORDER — FAMCICLOVIR 500 MG PO TABS: ORAL_TABLET | ORAL | 3 refills | Status: DC

## 2016-12-09 NOTE — Assessment & Plan Note (Signed)
On Levothyroxine, continue to monitor 

## 2016-12-09 NOTE — Assessment & Plan Note (Signed)
Recurrent vaginal lesions. Referred to gyn for evaluation and changed to Famvir. In need of pap as well

## 2016-12-09 NOTE — Patient Instructions (Addendum)
Folic Acid 099 to 8338 mcg daily Shingrix is the new shingles vaccine check with insurance regarding payment 2 shots over 6 months.   Preventive Care 68 Years and Older, Female Preventive care refers to lifestyle choices and visits with your health care provider that can promote health and wellness. What does preventive care include?  A yearly physical exam. This is also called an annual well check.  Dental exams once or twice a year.  Routine eye exams. Ask your health care provider how often you should have your eyes checked.  Personal lifestyle choices, including:  Daily care of your teeth and gums.  Regular physical activity.  Eating a healthy diet.  Avoiding tobacco and drug use.  Limiting alcohol use.  Practicing safe sex.  Taking low-dose aspirin every day.  Taking vitamin and mineral supplements as recommended by your health care provider. What happens during an annual well check? The services and screenings done by your health care provider during your annual well check will depend on your age, overall health, lifestyle risk factors, and family history of disease. Counseling  Your health care provider may ask you questions about your:  Alcohol use.  Tobacco use.  Drug use.  Emotional well-being.  Home and relationship well-being.  Sexual activity.  Eating habits.  History of falls.  Memory and ability to understand (cognition).  Work and work Statistician.  Reproductive health. Screening  You may have the following tests or measurements:  Height, weight, and BMI.  Blood pressure.  Lipid and cholesterol levels. These may be checked every 5 years, or more frequently if you are over 76 years old.  Skin check.  Lung cancer screening. You may have this screening every year starting at age 32 if you have a 30-pack-year history of smoking and currently smoke or have quit within the past 15 years.  Fecal occult blood test (FOBT) of the stool. You  may have this test every year starting at age 49.  Flexible sigmoidoscopy or colonoscopy. You may have a sigmoidoscopy every 5 years or a colonoscopy every 10 years starting at age 77.  Hepatitis C blood test.  Hepatitis B blood test.  Sexually transmitted disease (STD) testing.  Diabetes screening. This is done by checking your blood sugar (glucose) after you have not eaten for a while (fasting). You may have this done every 1-3 years.  Bone density scan. This is done to screen for osteoporosis. You may have this done starting at age 51.  Mammogram. This may be done every 1-2 years. Talk to your health care provider about how often you should have regular mammograms. Talk with your health care provider about your test results, treatment options, and if necessary, the need for more tests. Vaccines  Your health care provider may recommend certain vaccines, such as:  Influenza vaccine. This is recommended every year.  Tetanus, diphtheria, and acellular pertussis (Tdap, Td) vaccine. You may need a Td booster every 10 years.  Varicella vaccine. You may need this if you have not been vaccinated.  Zoster vaccine. You may need this after age 52.  Measles, mumps, and rubella (MMR) vaccine. You may need at least one dose of MMR if you were born in 1957 or later. You may also need a second dose.  Pneumococcal 13-valent conjugate (PCV13) vaccine. One dose is recommended after age 4.  Pneumococcal polysaccharide (PPSV23) vaccine. One dose is recommended after age 40.  Meningococcal vaccine. You may need this if you have certain conditions.  Hepatitis  A vaccine. You may need this if you have certain conditions or if you travel or work in places where you may be exposed to hepatitis A.  Hepatitis B vaccine. You may need this if you have certain conditions or if you travel or work in places where you may be exposed to hepatitis B.  Haemophilus influenzae type b (Hib) vaccine. You may need  this if you have certain conditions. Talk to your health care provider about which screenings and vaccines you need and how often you need them. This information is not intended to replace advice given to you by your health care provider. Make sure you discuss any questions you have with your health care provider. Document Released: 07/27/2015 Document Revised: 03/19/2016 Document Reviewed: 05/01/2015 Elsevier Interactive Patient Education  2017 [employer].

## 2016-12-09 NOTE — Assessment & Plan Note (Signed)
Well controlled, no changes to meds. Encouraged heart healthy diet such as the DASH diet and exercise as tolerated.  °

## 2016-12-09 NOTE — Assessment & Plan Note (Signed)
Patient encouraged to maintain heart healthy diet, regular exercise, adequate sleep. Consider daily probiotics. Take medications as prescribed 

## 2016-12-09 NOTE — Progress Notes (Signed)
Subjective:  I acted as a Education administrator for Dr. Charlett Blake. [name], [place]    Patient ID: Crystal [name], female    DOB: 08/02/1950, 66 y.o.   MRN: [id number]  Chief Complaint  Patient presents with  . Annual Exam    HPI  Patient is in today for an annual exam. Patient has no acute concerns. No recent febrile illness or acute hospitalizations. Denies CP/palp/SOB/HA/congestion/fevers/GI or GU c/o. Taking meds as prescribed. She has moved to her own apartment and is struggling with increased anxiety. No recent febrile illness or hospitalization. No polyruia or polydipsia but she acknowledges eating more carbs. 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 Denies CP/palp/SOB/HA/congestion/fevers/GI or GU c/o. Taking meds as prescribed   Patient Care Team: [name], MD as PCP - General (Family Medicine) Calvert Cantor, MD as Consulting Physician (Ophthalmology) Gatha Mayer, MD as Consulting Physician (Gastroenterology) Leinbach, Sol Blazing, MD as Referring Physician (Otolaryngology) Dene Gentry, MD as Consulting Physician (Sports Medicine) Rigoberto Noel, MD as Consulting Physician (Pulmonary Disease) Camelia Phenes, DPM as Consulting Physician (Podiatry) Cameron Sprang, MD as Consulting Physician (Neurology) Phipps, C. Shanon Brow, MD as Consulting Physician (Otolaryngology)   Past Medical History:  Diagnosis Date  . Acute peptic ulcer of stomach    As a teenager  . Allergy    dust, cock roach,grass,weeds,molds  . Anxiety   . Asthma    02/08/10 FEV1 1.98 (80%), s/p saba 2.22 l/m (90%).nml  . Cataract   . Cognitive change 06/10/2016  . Depression   . Diabetes mellitus 2003   Type II  . Diverticulosis   . Dyspepsia   . Fatty liver 12/19/2010  . GERD (gastroesophageal reflux disease)   . High cholesterol   . Hyperlipemia   . Hypertension   . Hypothyroidism 09/08/2013  . IBS (irritable bowel syndrome)   . Internal hemorrhoids    . Joint pain 08/08/2016  . Low back pain 08/10/2016  . OSA (obstructive sleep apnea)   . Plantar fasciitis   . Sun-damaged skin 12/09/2016  . Vitamin D deficiency 03/04/2016    Past Surgical History:  Procedure Laterality Date  . APPENDECTOMY     age 48  . CATARACT EXTRACTION, BILATERAL     12/8 and 12/14  . COLONOSCOPY  06/03/2010, 08/25/2011   diverticulosis   . GASTRECTOMY     partial - ulcer as a teen  . HAND SURGERY Right   . HEMORRHOID BANDING    . KNEE ARTHROSCOPY Left 1998  . NASAL SEPTUM SURGERY    . RHINOPLASTY    . TOE SURGERY Right 1995    Family History  Problem Relation Age of Onset  . Diabetes Mother        type II  . Heart attack Mother 8  . Stroke Mother   . Colon cancer Sister   . Asthma Maternal Grandmother   . Mental retardation Son        PTSD  . Parkinsonism Father   . Cancer Sister        stage 4 bladder cancer  . Colon polyps Sister   . Ulcers Unknown        bleeding gastric  . Heart defect Unknown        child  . Other Unknown        perforated bowel, child    Social History   Social History  . Marital status: Married    Spouse name: [name]  .  Number of children: 4  . Years of education: N/A   Occupational History  . retired Orthoptist   Social History Main Topics  . Smoking status: Never Smoker  . Smokeless tobacco: Never Used  . Alcohol use No  . Drug use: No  . Sexual activity: No     Comment: lives with her mother, no dietary restrictions   Other Topics Concern  . Not on file   Social History Narrative   No caffeine drinks daily     Outpatient Medications Prior to Visit  Medication Sig Dispense Refill  . ACCU-CHEK SOFTCLIX LANCETS lancets Use as directed twice daily to check blood sugar. DX E11.9 200 each 3  . Alcohol Swabs (B-D SINGLE USE SWABS REGULAR) PADS Use as directed to check blood sugar. DX E11.9 100 each 3  . atorvastatin (LIPITOR) 80 MG tablet TAKE 1 TABLET (80 MG) BY MOUTH DAILY. 90 tablet 2  . Blood  Glucose Calibration (ACCU-CHEK AVIVA) SOLN Use as directed to check blood sugar.  DX E11.9 1 each 3  . Blood Glucose Monitoring Suppl (ACCU-CHEK AVIVA PLUS) w/Device KIT Use as directed to check blood sugar.  DX E11.9 1 kit 0  . Cholecalciferol (VITAMIN D3) 2000 units TABS Take 1 tablet by mouth daily.    . diazepam (VALIUM) 5 MG tablet Take 0.5 to 1 tablet by mouth twice daily as needed for anxiety 40 tablet 2  . escitalopram (LEXAPRO) 10 MG tablet Take 1 tablet (10 mg total) by mouth daily. 90 tablet 2  . fenofibrate 160 MG tablet Take 1 tablet (160 mg total) by mouth daily. (Patient taking differently: Take 160 mg by mouth at bedtime. ) 90 tablet 2  . fluticasone (FLOVENT HFA) 110 MCG/ACT inhaler Inhale 1 puff into the lungs 2 (two) times daily. (Patient taking differently: Inhale 1 puff into the lungs 2 (two) times daily as needed. ) 1 Inhaler 12  . glimepiride (AMARYL) 2 MG tablet Take 2 mg by mouth 2 (two) times daily.    Marland Kitchen glucose blood (ACCU-CHEK AVIVA PLUS) test strip Use as directed twice daily to check blood sugar.  DX E11.9 200 each 3  . HYDROcodone-acetaminophen (NORCO/VICODIN) 5-325 MG tablet Take one by mouth at bedtime as needed for pain 10 tablet 0  . levothyroxine (SYNTHROID, LEVOTHROID) 25 MCG tablet Take 1 tablet (25 mcg total) by mouth daily before breakfast. 90 tablet 2  . losartan (COZAAR) 25 MG tablet Take 1 tablet (25 mg total) by mouth daily. 90 tablet 2  . Mirabegron (MYRBETRIQ PO) Take by mouth.    . Multiple Vitamins-Minerals (CENTRUM SILVER PO) Take 1 tablet by mouth daily.      . pantoprazole (PROTONIX) 40 MG tablet Take 1 tablet (40 mg total) by mouth daily. 90 tablet 1  . Probiotic Product (ALIGN) 4 MG CAPS Take 1 capsule by mouth daily. Reported on 07/02/2015    . VENTOLIN HFA 108 (90 Base) MCG/ACT inhaler INHALE 2 PUFFS BY MOUTH INTO THE LUNGS EVERY 4 (FOUR) HOURS AS NEEDED FOR SHORTNESS OF BREATH AND WHEEZING 18 g 0  . valACYclovir (VALTREX) 1000 MG tablet Take 1  tablet (1,000 mg total) by mouth 2 (two) times daily. 60 tablet 6   No facility-administered medications prior to visit.     Allergies  Allergen Reactions  . Compazine [Prochlorperazine Edisylate]     Comma for 3 days  . Tetanus Toxoid Other (See Comments)    convulsions  . Sulfa Antibiotics Other (See Comments)  Unknown    Review of Systems  Constitutional: Negative for fever and malaise/fatigue.  HENT: Negative for congestion.   Eyes: Negative for blurred vision.  Respiratory: Negative for cough and shortness of breath.   Cardiovascular: Negative for chest pain, palpitations and leg swelling.  Gastrointestinal: Negative for vomiting.  Musculoskeletal: Negative for back pain.  Skin: Positive for rash.  Neurological: Negative for loss of consciousness and headaches.       Objective:    Physical Exam  Constitutional: She is oriented to person, place, and time. She appears well-developed and well-nourished. No distress.  HENT:  Head: Normocephalic and atraumatic.  Eyes: Conjunctivae are normal.  Neck: Normal range of motion. No thyromegaly present.  Cardiovascular: Normal rate and regular rhythm.   Pulmonary/Chest: Effort normal and breath sounds normal. She has no wheezes.  Abdominal: Soft. Bowel sounds are normal. There is no tenderness.  Musculoskeletal: Normal range of motion. She exhibits no edema or deformity.  Neurological: She is alert and oriented to person, place, and time.  Skin: Skin is warm and dry. She is not diaphoretic.  Psychiatric: She has a normal mood and affect.    BP 126/78 (BP Location: Left Arm, Patient Position: Sitting, Cuff Size: Normal)   Pulse 64   Temp 98.1 F (36.7 C) (Oral)   Resp 18   Ht '5\' 2"'  (1.575 m)   Wt 148 lb 12.8 oz (67.5 kg)   SpO2 98%   BMI 27.22 kg/m  Wt Readings from Last 3 Encounters:  12/09/16 148 lb 12.8 oz (67.5 kg)  10/06/16 146 lb 9.6 oz (66.5 kg)  08/28/16 146 lb 3.2 oz (66.3 kg)   BP Readings from Last 3  Encounters:  12/09/16 126/78  11/04/16 124/82  10/06/16 128/82     Immunization History  Administered Date(s) Administered  . Influenza Split 05/26/2011, 03/31/2012  . Influenza Whole 03/27/2010  . Influenza,inj,Quad PF,36+ Mos 03/29/2013, 04/13/2014, 04/02/2015, 04/23/2016  . Pneumococcal Conjugate-13 04/25/2013  . Pneumococcal Polysaccharide-23 07/14/2004, 08/03/2015  . Zoster 12/27/2013    Health Maintenance  Topic Date Due  . OPHTHALMOLOGY EXAM  05/06/2016  . INFLUENZA VACCINE  02/11/2017  . HEMOGLOBIN A1C  05/31/2017  . FOOT EXAM  10/06/2017  . MAMMOGRAM  04/14/2018  . COLONOSCOPY  05/19/2021  . TETANUS/TDAP  09/10/2021  . DEXA SCAN  Completed  . Hepatitis C Screening  Completed  . PNA vac Low Risk Adult  Completed    Lab Results  Component Value Date   WBC 5.2 11/28/2016   HGB 13.2 11/28/2016   HCT 39.3 11/28/2016   PLT 296.0 11/28/2016   GLUCOSE 90 11/28/2016   CHOL 137 11/28/2016   TRIG 69.0 11/28/2016   HDL 50.00 11/28/2016   LDLDIRECT 123.0 09/18/2014   LDLCALC 73 11/28/2016   ALT 20 11/28/2016   AST 16 11/28/2016   NA 141 11/28/2016   K 4.0 11/28/2016   CL 108 11/28/2016   CREATININE 0.77 11/28/2016   BUN 17 11/28/2016   CO2 25 11/28/2016   TSH 3.02 11/28/2016   HGBA1C 7.0 (H) 11/28/2016   MICROALBUR 1.3 04/30/2015    Lab Results  Component Value Date   TSH 3.02 11/28/2016   Lab Results  Component Value Date   WBC 5.2 11/28/2016   HGB 13.2 11/28/2016   HCT 39.3 11/28/2016   MCV 91.7 11/28/2016   PLT 296.0 11/28/2016   Lab Results  Component Value Date   NA 141 11/28/2016   K 4.0 11/28/2016   CO2 25  11/28/2016   GLUCOSE 90 11/28/2016   BUN 17 11/28/2016   CREATININE 0.77 11/28/2016   BILITOT 0.4 11/28/2016   ALKPHOS 46 11/28/2016   AST 16 11/28/2016   ALT 20 11/28/2016   PROT 7.1 11/28/2016   ALBUMIN 4.3 11/28/2016   CALCIUM 10.2 11/28/2016   ANIONGAP 7 01/08/2015   GFR 79.33 11/28/2016   Lab Results  Component Value Date     CHOL 137 11/28/2016   Lab Results  Component Value Date   HDL 50.00 11/28/2016   Lab Results  Component Value Date   LDLCALC 73 11/28/2016   Lab Results  Component Value Date   TRIG 69.0 11/28/2016   Lab Results  Component Value Date   CHOLHDL 3 11/28/2016   Lab Results  Component Value Date   HGBA1C 7.0 (H) 11/28/2016         Assessment & Plan:   Problem List Items Addressed This Visit    Hyperlipidemia   Relevant Orders   Lipid panel   Essential hypertension - Primary    Well controlled, no changes to meds. Encouraged heart healthy diet such as the DASH diet and exercise as tolerated.       Relevant Orders   CBC   Comprehensive metabolic panel   Hypothyroidism    On Levothyroxine, continue to monitor      Relevant Orders   TSH   Osteopenia    Encouraged to get adequate exercise, calcium and vitamin d intake      Relevant Orders   VITAMIN D 25 Hydroxy (Vit-D Deficiency, Fractures)   Preventative health care    Patient encouraged to maintain heart healthy diet, regular exercise, adequate sleep. Consider daily probiotics. Take medications as prescribed      Vitamin D deficiency    Encouraged daily supplements      Relevant Orders   VITAMIN D 25 Hydroxy (Vit-D Deficiency, Fractures)   Herpes simplex disease    Recurrent vaginal lesions. Referred to gyn for evaluation and changed to Famvir. In need of pap as well      Relevant Medications   famciclovir (FAMVIR) 500 MG tablet   Sun-damaged skin    Referred to dermatology for further consideration      Relevant Orders   Ambulatory referral to Dermatology    Other Visit Diagnoses    Type 2 diabetes mellitus with neurological complications ([place])       Relevant Orders   Hemoglobin A1c      I have discontinued Ms. Poteet's valACYclovir. I am also having her start on famciclovir. Additionally, I am having her maintain her Multiple Vitamins-Minerals (CENTRUM SILVER PO), ALIGN, diazepam,  losartan, levothyroxine, fenofibrate, atorvastatin, ACCU-CHEK AVIVA PLUS, ACCU-CHEK AVIVA, B-D SINGLE USE SWABS REGULAR, glucose blood, ACCU-CHEK SOFTCLIX LANCETS, pantoprazole, glimepiride, VENTOLIN HFA, HYDROcodone-acetaminophen, escitalopram, Vitamin D3, fluticasone, and Mirabegron (MYRBETRIQ PO).  Meds ordered this encounter  Medications  . famciclovir (FAMVIR) 500 MG tablet    Sig: 1000 mg po bid x 24 hours then 250 mg po bid every day    Dispense:  35 tablet    Refill:  3    CMA served as scribe during this visit. History, Physical and Plan performed by medical provider. Documentation and orders reviewed and attested to.  Penni Homans, MD

## 2016-12-09 NOTE — Assessment & Plan Note (Signed)
Encouraged to get adequate exercise, calcium and vitamin d intake 

## 2016-12-09 NOTE — Patient Instructions (Signed)
Your finger pain is due to arthritis. These are the different medications you can take for this: Tylenol 500mg  1-2 tabs three times a day for pain. Capsaicin, aspercreme, or biofreeze topically up to four times a day may also help with pain. Some supplements that may help for arthritis: Boswellia extract, curcumin, pycnogenol Aleve 1-2 tabs twice a day with food Heat or ice 15 minutes at a time 3-4 times a day as needed to help with pain. Consider hand surgeon referral to discuss joint fusion.  Your knee pain is more consistent with a lateral meniscus tear. You were given a cortisone shot today. Straight leg raises, knee extensions 3 sets of 10 once a day (add ankle weight if these become too easy). Knee brace for support when up and walking around. Medications as noted above. Consider physical therapy, MRI if you're not improving as I would expect. Follow up with me in 5-6 weeks.

## 2016-12-09 NOTE — Assessment & Plan Note (Signed)
Encouraged daily supplements 

## 2016-12-09 NOTE — Assessment & Plan Note (Signed)
Referred to dermatology for further consideration.  

## 2016-12-10 DIAGNOSIS — M25561 Pain in right knee: Secondary | ICD-10-CM | POA: Diagnosis not present

## 2016-12-10 DIAGNOSIS — G8929 Other chronic pain: Secondary | ICD-10-CM | POA: Diagnosis not present

## 2016-12-10 DIAGNOSIS — M19042 Primary osteoarthritis, left hand: Secondary | ICD-10-CM | POA: Diagnosis not present

## 2016-12-10 DIAGNOSIS — M19041 Primary osteoarthritis, right hand: Secondary | ICD-10-CM | POA: Diagnosis not present

## 2016-12-10 MED ORDER — METHYLPREDNISOLONE ACETATE 40 MG/ML IJ SUSP
40.0000 mg | Freq: Once | INTRAMUSCULAR | Status: AC
  Administered 2016-12-10: 40 mg via INTRA_ARTICULAR

## 2016-12-11 DIAGNOSIS — M25561 Pain in right knee: Secondary | ICD-10-CM | POA: Insufficient documentation

## 2016-12-11 NOTE — Assessment & Plan Note (Signed)
2/2 lateral meniscus tear, less likely arthritis.  Shown home exercises to do daily.  Knee brace for support.  Tylenol, topical medications, some supplements that may help, aleve reviewed.  Injection given as well.  F/u in 5-6 weeks.  After informed written consent, patient was seated on exam table. Right knee was prepped with alcohol swab and utilizing anteromedial approach, patient's right knee was injected intraarticularly with 3:1 bupivicaine: depomedrol. Patient tolerated the procedure well without immediate complications.

## 2016-12-11 NOTE — Progress Notes (Addendum)
PCP: [name], MD  Subjective:   HPI: Patient is a 66 y.o. female here for right knee, left hand pain.  Patient reports she's had about 2 months of pain in right knee. Pain medial, lateral, and posterior. A constant ache at 2/10 level, up to 10/10 and sharp at worst by end of day, with walking. Taking ibuprofen. Feels like knee is slipping out of joint. Also with pain left fifth digit with swelling. Cannot fully bend this finger. Pain is 1/10, comes and goes. Is right handed. No skin changes, numbness.  Past Medical History:  Diagnosis Date  . Acute peptic ulcer of stomach    As a teenager  . Allergy    dust, cock roach,grass,weeds,molds  . Anxiety   . Asthma    02/08/10 FEV1 1.98 (80%), s/p saba 2.22 l/m (90%).nml  . Cataract   . Cognitive change 06/10/2016  . Depression   . Diabetes mellitus 2003   Type II  . Diverticulosis   . Dyspepsia   . Fatty liver 12/19/2010  . GERD (gastroesophageal reflux disease)   . High cholesterol   . Hyperlipemia   . Hypertension   . Hypothyroidism 09/08/2013  . IBS (irritable bowel syndrome)   . Internal hemorrhoids   . Joint pain 08/08/2016  . Low back pain 08/10/2016  . OSA (obstructive sleep apnea)   . Plantar fasciitis   . Sun-damaged skin 12/09/2016  . Vitamin D deficiency 03/04/2016    Current Outpatient Prescriptions on File Prior to Visit  Medication Sig Dispense Refill  . ACCU-CHEK SOFTCLIX LANCETS lancets Use as directed twice daily to check blood sugar. DX E11.9 200 each 3  . Alcohol Swabs (B-D SINGLE USE SWABS REGULAR) PADS Use as directed to check blood sugar. DX E11.9 100 each 3  . atorvastatin (LIPITOR) 80 MG tablet TAKE 1 TABLET (80 MG) BY MOUTH DAILY. 90 tablet 2  . Blood Glucose Calibration (ACCU-CHEK AVIVA) SOLN Use as directed to check blood sugar.  DX E11.9 1 each 3  . Blood Glucose Monitoring Suppl (ACCU-CHEK AVIVA PLUS) w/Device KIT Use as directed to check blood sugar.  DX E11.9 1 kit 0  . Cholecalciferol  (VITAMIN D3) 2000 units TABS Take 1 tablet by mouth daily.    . diazepam (VALIUM) 5 MG tablet Take 0.5 to 1 tablet by mouth twice daily as needed for anxiety 40 tablet 2  . escitalopram (LEXAPRO) 10 MG tablet Take 1 tablet (10 mg total) by mouth daily. 90 tablet 2  . famciclovir (FAMVIR) 500 MG tablet 1000 mg po bid x 24 hours then 250 mg po bid every day 35 tablet 3  . fenofibrate 160 MG tablet Take 1 tablet (160 mg total) by mouth daily. (Patient taking differently: Take 160 mg by mouth at bedtime. ) 90 tablet 2  . fluticasone (FLOVENT HFA) 110 MCG/ACT inhaler Inhale 1 puff into the lungs 2 (two) times daily. (Patient taking differently: Inhale 1 puff into the lungs 2 (two) times daily as needed. ) 1 Inhaler 12  . glimepiride (AMARYL) 2 MG tablet Take 2 mg by mouth 2 (two) times daily.    Marland Kitchen glucose blood (ACCU-CHEK AVIVA PLUS) test strip Use as directed twice daily to check blood sugar.  DX E11.9 200 each 3  . HYDROcodone-acetaminophen (NORCO/VICODIN) 5-325 MG tablet Take one by mouth at bedtime as needed for pain 10 tablet 0  . levothyroxine (SYNTHROID, LEVOTHROID) 25 MCG tablet Take 1 tablet (25 mcg total) by mouth daily before breakfast.  90 tablet 2  . losartan (COZAAR) 25 MG tablet Take 1 tablet (25 mg total) by mouth daily. 90 tablet 2  . Mirabegron (MYRBETRIQ PO) Take by mouth.    . Multiple Vitamins-Minerals (CENTRUM SILVER PO) Take 1 tablet by mouth daily.      . pantoprazole (PROTONIX) 40 MG tablet Take 1 tablet (40 mg total) by mouth daily. 90 tablet 1  . Probiotic Product (ALIGN) 4 MG CAPS Take 1 capsule by mouth daily. Reported on 07/02/2015    . VENTOLIN HFA 108 (90 Base) MCG/ACT inhaler INHALE 2 PUFFS BY MOUTH INTO THE LUNGS EVERY 4 (FOUR) HOURS AS NEEDED FOR SHORTNESS OF BREATH AND WHEEZING 18 g 0   No current facility-administered medications on file prior to visit.     Past Surgical History:  Procedure Laterality Date  . APPENDECTOMY     age 80  . CATARACT EXTRACTION,  BILATERAL     12/8 and 12/14  . COLONOSCOPY  06/03/2010, 08/25/2011   diverticulosis   . GASTRECTOMY     partial - ulcer as a teen  . HAND SURGERY Right   . HEMORRHOID BANDING    . KNEE ARTHROSCOPY Left 1998  . NASAL SEPTUM SURGERY    . RHINOPLASTY    . TOE SURGERY Right 1995    Allergies  Allergen Reactions  . Compazine [Prochlorperazine Edisylate]     Comma for 3 days  . Tetanus Toxoid Other (See Comments)    convulsions  . Sulfa Antibiotics Other (See Comments)    Unknown    Social History   Social History  . Marital status: Married    Spouse name: [name]  . Number of children: 4  . Years of education: N/A   Occupational History  . retired Orthoptist   Social History Main Topics  . Smoking status: Never Smoker  . Smokeless tobacco: Never Used  . Alcohol use No  . Drug use: No  . Sexual activity: No     Comment: lives with her mother, no dietary restrictions   Other Topics Concern  . Not on file   Social History Narrative   No caffeine drinks daily     Family History  Problem Relation Age of Onset  . Diabetes Mother        type II  . Heart attack Mother 37  . Stroke Mother   . Colon cancer Sister   . Asthma Maternal Grandmother   . Mental retardation Son        PTSD  . Parkinsonism Father   . Cancer Sister        stage 4 bladder cancer  . Colon polyps Sister   . Ulcers Unknown        bleeding gastric  . Heart defect Unknown        child  . Other Unknown        perforated bowel, child    BP 136/83   Pulse 69   Ht '5\' 2"'  (1.575 m)   Wt 147 lb (66.7 kg)   BMI 26.89 kg/m   Review of Systems: See HPI above.     Objective:  Physical Exam:  Gen: NAD, comfortable in exam room  Right knee: No gross deformity, ecchymoses, effusion. TTP lateral joint line. FROM. Negative ant/post drawers. Negative valgus/varus testing. Negative lachmanns. Positive mcmurrays, apleys.  Negative patellar apprehension. NV intact distally.  Left knee: FROM  without pain.  Left hand: Swelling of PIP joint circumferentially 5th digit.  Noted other small  heberdens and bouchards nodes of both hands. Full extension but unable to fully flex at PIP more than DIP joints 5th digit. Collateral ligaments intact. NVI distally.   Assessment & Plan:  1. Right knee pain - 2/2 lateral meniscus tear, less likely arthritis.  Shown home exercises to do daily.  Knee brace for support.  Tylenol, topical medications, some supplements that may help, aleve reviewed.  Injection given as well.  F/u in 5-6 weeks.  After informed written consent, patient was seated on exam table. Right knee was prepped with alcohol swab and utilizing anteromedial approach, patient's right knee was injected intraarticularly with 3:1 bupivicaine: depomedrol. Patient tolerated the procedure well without immediate complications.  2. Left hand pain - 2/2 arthritis.  Discussed tylenol, topical medications, supplements that may help, aleve.  Heat/ice.  Consider hand surgery referral if she's struggling with pain here.  Addendum:  MRI knee reviewed and discussed with patient.  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  Discussed therapy is typically equivalent to surgery for this - she would like to exhaust conservative treatment before considering arthroscopy trial.  Will go ahead with PT and viscosupplementation.

## 2016-12-11 NOTE — Assessment & Plan Note (Signed)
Discussed tylenol, topical medications, supplements that may help, aleve.  Heat/ice.  Consider hand surgery referral if she's struggling with pain here.

## 2016-12-17 ENCOUNTER — Encounter: Payer: Self-pay | Admitting: Family Medicine

## 2016-12-29 DIAGNOSIS — L821 Other seborrheic keratosis: Secondary | ICD-10-CM | POA: Diagnosis not present

## 2016-12-29 DIAGNOSIS — D485 Neoplasm of uncertain behavior of skin: Secondary | ICD-10-CM | POA: Diagnosis not present

## 2016-12-29 DIAGNOSIS — D225 Melanocytic nevi of trunk: Secondary | ICD-10-CM | POA: Diagnosis not present

## 2016-12-30 ENCOUNTER — Telehealth: Payer: Self-pay | Admitting: Family Medicine

## 2016-12-30 ENCOUNTER — Ambulatory Visit (INDEPENDENT_AMBULATORY_CARE_PROVIDER_SITE_OTHER): Payer: Medicare HMO | Admitting: Podiatry

## 2016-12-30 ENCOUNTER — Ambulatory Visit (HOSPITAL_BASED_OUTPATIENT_CLINIC_OR_DEPARTMENT_OTHER)
Admission: RE | Admit: 2016-12-30 | Discharge: 2016-12-30 | Disposition: A | Discharge status: Home / Self Care | Payer: Medicare HMO | Source: Ambulatory Visit | Attending: Podiatry | Admitting: Podiatry

## 2016-12-30 DIAGNOSIS — M216X9 Other acquired deformities of unspecified foot: Secondary | ICD-10-CM

## 2016-12-30 DIAGNOSIS — M79672 Pain in left foot: Secondary | ICD-10-CM

## 2016-12-30 DIAGNOSIS — Q828 Other specified congenital malformations of skin: Secondary | ICD-10-CM | POA: Diagnosis not present

## 2016-12-30 DIAGNOSIS — R52 Pain, unspecified: Secondary | ICD-10-CM | POA: Diagnosis present

## 2016-12-30 DIAGNOSIS — M79671 Pain in right foot: Secondary | ICD-10-CM

## 2016-12-30 DIAGNOSIS — M19071 Primary osteoarthritis, right ankle and foot: Secondary | ICD-10-CM | POA: Diagnosis not present

## 2016-12-30 DIAGNOSIS — M205X1 Other deformities of toe(s) (acquired), right foot: Secondary | ICD-10-CM | POA: Diagnosis not present

## 2016-12-30 NOTE — Telephone Encounter (Signed)
Patient states her right knee is no better. Patient feels as though it is getting worse Requesting a call back

## 2016-12-30 NOTE — Telephone Encounter (Signed)
If she's not improving I'd consider an MRI to assess for a lateral meniscus tear.

## 2016-12-30 NOTE — Telephone Encounter (Signed)
Spoke to patient and she wants to do MRI. Will set up.

## 2016-12-31 ENCOUNTER — Telehealth: Payer: Self-pay | Admitting: *Deleted

## 2016-12-31 NOTE — Telephone Encounter (Signed)
Pt states she doesn't feel she is applying the pads correctly. I spoke to pt and she states she broke the pad [place] gave yesterday, and didn't know how to use the other. I offered to replace the broken pad if she could come to the [place] office and she stated she did not think she could make it. I asked Dr. Leigh Aurora assistant Lattie Haw to speak with pt and she explained how to use the alternate pad and she had pt come to [facility] office to get the replacement pad.

## 2017-01-01 NOTE — Addendum Note (Signed)
Addended by: Sherrie George F on: 01/01/2017 10:43 AM   Modules accepted: Orders

## 2017-01-01 NOTE — Progress Notes (Signed)
Subjective:    Patient ID: Crystal [name], female   DOB: 66 y.o.   MRN: [id number]   HPI 66 year old female presents the office they for concerns of painful calluses to both of her feet. She states that she feels that she is walking on cement hard was seen with her right foot worse than her left. She is also notes that her toes have started to separate the right foot. She denies any recent injury or trauma to her feet and his been ongoing for several months. She denies any numbness or tingling. She has no other concerns today.   Review of Systems  All other systems reviewed and are negative.       Objective:  Physical Exam General: AAO x3, NAD  Dermatological: Hyperkeratotic lesions present left foot some exercises as well as the right foot submetatarsal 2/3. Upon debridement there is no underlying ulceration, drainage or any signs of infection. No open lesions are identified otherwise.   Vascular: DP/PT pulses 2/4, CRT less than 3 seconds. There is no pain with calf compression, swelling, warmth, erythema.   Neruologic: Sensation decreased with Derrel Nip monofilament. She is receiving diagnosed with neuropathy.  Musculoskeletal: There is prominent metatarsal heads plantarly with atrophy to Have the right side worse in left and there is tenderness right foot second metatarsal to in 3 on the hyperkeratotic lesions. There is a decrease in range of motion of first MTPJ on the right side present. Medial deviation of the second toe present. There is no specific area pinpoint bony tenderness or pain the vibratory sensation.  Muscular strength 5/5 in all groups tested bilateral.  Gait: Unassisted, Nonantalgic.      Assessment:     66 year old female hyperkeratotic lesion submetatarsal likely due to biomechanical changes    Plan:     -Treatment options discussed including all alternatives, risks, and complications -Etiology of symptoms were discussed -X-rays  ordered -Hyperkeratotic lesions were sharply debrided 3 without complications or bleeding.  -I dispensed offloading pads and I also discussed shoe gear modifications and possibly orthotics.  -She is inquiring about possible surgical intervention. Discussed briefly surgery however we'll get x-rays first told that her bone surgery.  -Follow-up as scheduled or sooner if needed. Call any questions or concerns.   Celesta Gentile, DPM

## 2017-01-02 ENCOUNTER — Telehealth: Payer: Self-pay | Admitting: *Deleted

## 2017-01-02 NOTE — Telephone Encounter (Signed)
Pt called for x-ray results. Dr. 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 I informed pt.

## 2017-01-12 ENCOUNTER — Encounter (HOSPITAL_COMMUNITY): Payer: Self-pay | Admitting: Psychiatry

## 2017-01-12 ENCOUNTER — Ambulatory Visit (INDEPENDENT_AMBULATORY_CARE_PROVIDER_SITE_OTHER): Payer: Medicare HMO

## 2017-01-12 ENCOUNTER — Ambulatory Visit (INDEPENDENT_AMBULATORY_CARE_PROVIDER_SITE_OTHER): Payer: Medicare HMO | Admitting: Psychiatry

## 2017-01-12 VITALS — BP 160/94 | HR 69 | Ht 62.5 in | Wt 149.0 lb

## 2017-01-12 DIAGNOSIS — Z882 Allergy status to sulfonamides status: Secondary | ICD-10-CM | POA: Diagnosis not present

## 2017-01-12 DIAGNOSIS — E1121 Type 2 diabetes mellitus with diabetic nephropathy: Secondary | ICD-10-CM | POA: Diagnosis not present

## 2017-01-12 DIAGNOSIS — Z888 Allergy status to other drugs, medicaments and biological substances status: Secondary | ICD-10-CM | POA: Diagnosis not present

## 2017-01-12 DIAGNOSIS — K589 Irritable bowel syndrome without diarrhea: Secondary | ICD-10-CM

## 2017-01-12 DIAGNOSIS — E039 Hypothyroidism, unspecified: Secondary | ICD-10-CM | POA: Diagnosis not present

## 2017-01-12 DIAGNOSIS — Z810 Family history of intellectual disabilities: Secondary | ICD-10-CM

## 2017-01-12 DIAGNOSIS — Z79899 Other long term (current) drug therapy: Secondary | ICD-10-CM | POA: Diagnosis not present

## 2017-01-12 DIAGNOSIS — M23221 Derangement of posterior horn of medial meniscus due to old tear or injury, right knee: Secondary | ICD-10-CM | POA: Diagnosis not present

## 2017-01-12 DIAGNOSIS — G3184 Mild cognitive impairment, so stated: Secondary | ICD-10-CM | POA: Diagnosis not present

## 2017-01-12 DIAGNOSIS — M25561 Pain in right knee: Secondary | ICD-10-CM | POA: Diagnosis not present

## 2017-01-12 DIAGNOSIS — F329 Major depressive disorder, single episode, unspecified: Secondary | ICD-10-CM

## 2017-01-12 DIAGNOSIS — F419 Anxiety disorder, unspecified: Secondary | ICD-10-CM

## 2017-01-12 DIAGNOSIS — Z7984 Long term (current) use of oral hypoglycemic drugs: Secondary | ICD-10-CM

## 2017-01-12 DIAGNOSIS — I10 Essential (primary) hypertension: Secondary | ICD-10-CM | POA: Diagnosis not present

## 2017-01-12 DIAGNOSIS — M1711 Unilateral primary osteoarthritis, right knee: Secondary | ICD-10-CM | POA: Diagnosis not present

## 2017-01-12 DIAGNOSIS — Z7951 Long term (current) use of inhaled steroids: Secondary | ICD-10-CM

## 2017-01-12 DIAGNOSIS — F32A Depression, unspecified: Secondary | ICD-10-CM

## 2017-01-12 DIAGNOSIS — E785 Hyperlipidemia, unspecified: Secondary | ICD-10-CM

## 2017-01-12 MED ORDER — DONEPEZIL HCL 5 MG PO TABS: 5.0000 mg | ORAL_TABLET | Freq: Every day | ORAL | 2 refills | Status: DC

## 2017-01-12 MED FILL — DONEPEZIL HCL 5 MG TABLET: 5 | 30 days supply | Qty: 30 | Fill #0

## 2017-01-12 NOTE — Patient Instructions (Signed)
Donepezil tablets What is this medicine? DONEPEZIL (doe NEP e zil) is used to treat mild to moderate dementia caused by Alzheimer's disease. This medicine may be used for other purposes; ask your health care provider or pharmacist if you have questions. COMMON BRAND NAME(S): Aricept What should I tell my health care provider before I take this medicine? They need to know if you have any of these conditions: -asthma or other lung disease -difficulty passing urine -head injury -heart disease -history of irregular heartbeat -liver disease -seizures (convulsions) -stomach or intestinal disease, ulcers or stomach bleeding -an unusual or allergic reaction to donepezil, other medicines, foods, dyes, or preservatives -pregnant or trying to get pregnant -breast-feeding How should I use this medicine? Take this medicine by mouth with a glass of water. Follow the directions on the prescription label. You may take this medicine with or without food. Take this medicine at regular intervals. This medicine is usually taken before bedtime. Do not take it more often than directed. Continue to take your medicine even if you feel better. Do not stop taking except on your doctor's advice. If you are taking the 23 mg donepezil tablet, swallow it whole; do not cut, crush, or chew it. Talk to your pediatrician regarding the use of this medicine in children. Special care may be needed. Overdosage: If you think you have taken too much of this medicine contact a poison control center or emergency room at once. NOTE: This medicine is only for you. Do not share this medicine with others. What if I miss a dose? If you miss a dose, take it as soon as you can. If it is almost time for your next dose, take only that dose, do not take double or extra doses. What may interact with this medicine? Do not take this medicine with any of the following medications: -certain medicines for fungal infections like itraconazole,  fluconazole, posaconazole, and voriconazole -cisapride -dextromethorphan; quinidine -dofetilide -dronedarone -pimozide -quinidine -thioridazine -ziprasidone This medicine may also interact with the following medications: -antihistamines for allergy, cough and cold -atropine -bethanechol -carbamazepine -certain medicines for bladder problems like oxybutynin, tolterodine -certain medicines for Parkinson's disease like benztropine, trihexyphenidyl -certain medicines for stomach problems like dicyclomine, hyoscyamine -certain medicines for travel sickness like scopolamine -dexamethasone -ipratropium -NSAIDs, medicines for pain and inflammation, like ibuprofen or naproxen -other medicines for Alzheimer's disease -other medicines that prolong the QT interval (cause an abnormal heart rhythm) -phenobarbital -phenytoin -rifampin, rifabutin or rifapentine This list may not describe all possible interactions. Give your health care provider a list of all the medicines, herbs, non-prescription drugs, or dietary supplements you use. Also tell them if you smoke, drink alcohol, or use illegal drugs. Some items may interact with your medicine. What should I watch for while using this medicine? Visit your doctor or health care professional for regular checks on your progress. Check with your doctor or health care professional if your symptoms do not get better or if they get worse. You may get drowsy or dizzy. Do not drive, use machinery, or do anything that needs mental alertness until you know how this drug affects you. What side effects may I notice from receiving this medicine? Side effects that you should report to your doctor or health care professional as soon as possible: -allergic reactions like skin rash, itching or hives, swelling of the face, lips, or tongue -feeling faint or lightheaded, falls -loss of bladder control -seizures -signs and symptoms of a dangerous change in heartbeat or  heart   rhythm like chest pain; dizziness; fast or irregular heartbeat; palpitations; feeling faint or lightheaded, falls; breathing problems -signs and symptoms of infection like fever or chills; cough; sore throat; pain or trouble passing urine -signs and symptoms of liver injury like dark yellow or brown urine; general ill feeling or flu-like symptoms; light-colored stools; loss of appetite; nausea; right upper belly pain; unusually weak or tired; yellowing of the eyes or skin -slow heartbeat or palpitations -unusual bleeding or bruising -vomiting Side effects that usually do not require medical attention (report to your doctor or health care professional if they continue or are bothersome): -diarrhea, especially when starting treatment -headache -loss of appetite -muscle cramps -nausea -stomach upset This list may not describe all possible side effects. Call your doctor for medical advice about side effects. You may report side effects to FDA at 1-800-FDA-1088. Where should I keep my medicine? Keep out of reach of children. Store at room temperature between 15 and 30 degrees C (59 and 86 degrees F). Throw away any unused medicine after the expiration date. NOTE: This sheet is a summary. It may not cover all possible information. If you have questions about this medicine, talk to your doctor, pharmacist, or health care provider.  2018 Elsevier/Gold Standard (2015-12-17 21:00:42)       Mild Neurocognitive Disorder Mild neurocognitive disorder (formerly known as mild cognitive impairment) is a mental disorder. It is a slight abnormal decrease in mental function. The areas of mental function affected may include memory, thought, communication, behavior, and completion of tasks. The decrease is noticeable and measurable but for the most part does not interfere with your daily activities. Mild neurocognitive disorder typically occurs in people older than 60 years but can occur earlier. It is  not as serious as major neurocognitive disorder (formerly known as dementia) but may lead to a more serious neurocognitive disorder. However, in some cases the condition does not get worse. A few people with this disorder even improve. What are the causes? There are a number of different causes of mild neurocognitive disorder:  Brain disorders associated with abnormal protein deposits, such as Alzheimer's disease, Pick's disease, and Lewy body disease.  Brain disorders associated with abnormal movement, such as Parkinson's disease and Huntington's disease.  Diseases affecting blood vessels in the brain and resulting in mini-strokes.  Certain infections, such as human immunodeficiency virus (HIV) infection.  Traumatic brain injury.  Other medical conditions such as brain tumors, underactive thyroid (hypothyroidism), and vitamin B12 deficiency.  Use of certain prescription medicine and "recreational" drugs.  What are the signs or symptoms? Symptoms of mild neurocognitive disorder include:  Difficulty remembering. You may forget details of recent events, names, or phone numbers. You may forget important social events and appointments or repeatedly forget where you put your car keys.  Difficulty thinking and solving problems. You may have trouble with complex tasks such as paying bills or driving in unfamiliar locations.  Difficulty communicating. You may have trouble finding the right word, naming an object, forming a sentence that makes sense, or understanding what you read or hear.  Changes in your behavior or personality. You may lose interest in the things that you used to enjoy or withdraw from social situations. You may get angry more easily than usual. You may act before thinking. You may do things in public that you would not usually do. You may hear or see things that are not real (hallucinations). You may believe falsely that others are trying to hurt you (paranoia).  How is this  diagnosed? Mild neurocognitive disorder is diagnosed through an assessment by your health care provider. Your health care provider will ask you and your family, friends, or coworkers questions about your symptoms. He or she will ask how often the symptoms occur, how long they have been occurring, whether they are getting worse, and the effect they are having on your life. Your health care provider may refer you to a neurologist or mental health specialist for a detailed evaluation of your mental functions (neuropsychological testing). To identify the cause of your mild neurocognitive disorder, your health care provider may:  Obtain a detailed medical history.  Ask about alcohol and drug use, including prescription medicine.  Perform a physical exam.  Order blood tests and brain imaging exams.  How is this treated? Mild neurocognitive disorder caused by infections, use of certain medicines or "recreational" drugs, and certain medical conditions may improve with treatment of the condition that is causing the disorder. Mild neurocognitive disorder resulting from other causes generally does not improve and may worsen. In these cases, the goal of treatment is to slow progression of the disorder and help you cope with the loss of mental function. Treatments in these cases include:  Medicine. Medicine helps mainly with memory loss and behavioral symptoms.  Talk therapy. Talk therapy provides education, emotional support, memory aids, and other ways of making up for decreases in mental function.  Lifestyle changes. These include regular exercise, a healthy diet (including essential omega-3 fatty acids), intellectual stimulation, and increased social interaction.  This information is not intended to replace advice given to you by your health care provider. Make sure you discuss any questions you have with your health care provider. Document Released: 03/02/2013 Document Revised: 12/06/2015 Document  Reviewed: 11/22/2012 Elsevier Interactive Patient Education  2017 [employer].
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
## 2017-01-15 NOTE — Addendum Note (Signed)
Addended by: Sherrie George F on: 01/15/2017 08:12 AM   Modules accepted: Orders

## 2017-01-20 ENCOUNTER — Ambulatory Visit (INDEPENDENT_AMBULATORY_CARE_PROVIDER_SITE_OTHER): Payer: Medicare HMO | Admitting: Podiatry

## 2017-01-20 ENCOUNTER — Encounter: Payer: Self-pay | Admitting: Podiatry

## 2017-01-20 DIAGNOSIS — M205X1 Other deformities of toe(s) (acquired), right foot: Secondary | ICD-10-CM

## 2017-01-20 DIAGNOSIS — Q828 Other specified congenital malformations of skin: Secondary | ICD-10-CM | POA: Diagnosis not present

## 2017-01-20 DIAGNOSIS — M216X9 Other acquired deformities of unspecified foot: Secondary | ICD-10-CM

## 2017-01-21 ENCOUNTER — Ambulatory Visit (INDEPENDENT_AMBULATORY_CARE_PROVIDER_SITE_OTHER): Payer: Medicare HMO | Admitting: Physical Therapy

## 2017-01-21 ENCOUNTER — Encounter: Payer: Self-pay | Admitting: Physical Therapy

## 2017-01-21 DIAGNOSIS — M25562 Pain in left knee: Secondary | ICD-10-CM

## 2017-01-21 DIAGNOSIS — G8929 Other chronic pain: Secondary | ICD-10-CM

## 2017-01-21 DIAGNOSIS — M25561 Pain in right knee: Secondary | ICD-10-CM

## 2017-01-21 DIAGNOSIS — M6281 Muscle weakness (generalized): Secondary | ICD-10-CM | POA: Diagnosis not present

## 2017-01-21 DIAGNOSIS — R2689 Other abnormalities of gait and mobility: Secondary | ICD-10-CM

## 2017-01-21 NOTE — Therapy (Signed)
[facility] [place], [place], [address] Phone: [phone]   Fax:  [phone]  Physical Therapy Evaluation  Patient Details  Name: Crystal [name] MRN: [id number] Date of Birth: 04-30-1951 Referring Provider: Dr Barbaraann Barthel  Encounter Date: 01/21/2017      PT End of Session - 01/21/17 1032    Visit Number 1   Number of Visits 12   Date for PT Re-Evaluation 03/04/17   PT Start Time 1027   PT Stop Time 1120   PT Time Calculation (min) 53 min   Activity Tolerance Patient limited by pain  in Lt knee      Past Medical History:  Diagnosis Date  . Acute peptic ulcer of stomach    As a teenager  . Allergy    dust, cock roach,grass,weeds,molds  . Anxiety   . Asthma    02/08/10 FEV1 1.98 (80%), s/p saba 2.22 l/m (90%).nml  . Cataract   . Cognitive change 06/10/2016  . Depression   . Diabetes mellitus 2003   Type II  . Diverticulosis   . Dyspepsia   . Fatty liver 12/19/2010  . GERD (gastroesophageal reflux disease)   . High cholesterol   . Hyperlipemia   . Hypertension   . Hypothyroidism 09/08/2013  . IBS (irritable bowel syndrome)   . Internal hemorrhoids   . Joint pain 08/08/2016  . Low back pain 08/10/2016  . OSA (obstructive sleep apnea)   . Plantar fasciitis   . Sun-damaged skin 12/09/2016  . Vitamin D deficiency 03/04/2016    Past Surgical History:  Procedure Laterality Date  . APPENDECTOMY     age 56  . CATARACT EXTRACTION, BILATERAL     12/8 and 12/14  . COLONOSCOPY  06/03/2010, 08/25/2011   diverticulosis   . GASTRECTOMY     partial - ulcer as a teen  . HAND SURGERY Right   . HEMORRHOID BANDING    . KNEE ARTHROSCOPY Left 1998  . NASAL SEPTUM SURGERY    . RHINOPLASTY    . TOE SURGERY Right 1995    There were no vitals filed for this visit.       Subjective Assessment - 01/21/17 1028    Subjective Pt reports she has years of h/o Lt knee pain and the Rt knee started bothering  her about 3-4 months ago.  She is having more pain in the Lt knee over the last week.     Pertinent History 1999 scope Lt knee   How long can you sit comfortably? no limitation   How long can you walk comfortably? immediate pain   Diagnostic tests MRI - arthritis and meniscus tear.    Patient Stated Goals walk without pain or minimal pain, walk her dog - goes up /down stairs 3-4 time a day.    Currently in Pain? Yes   Pain Score --  Lt ranges from 3-12/10, rt ranges 2-8/10   Pain Location Knee   Pain Orientation Left;Right   Pain Type Chronic pain   Pain Frequency Constant   Aggravating Factors  being still then getting up on her legs, being seditentary   Pain Relieving Factors after moving around a bit, biofreeze, knee stability braces            [facility] PT Assessment - 01/21/17 0001      Assessment   Medical Diagnosis Rt knee pain, Lt knee pain   Referring Provider Dr Barbaraann Barthel   Onset Date/Surgical Date 09/21/16  Hand Dominance Right   Next MD Visit PRN   Prior Therapy years ago Lt 1999     Precautions   Precautions None   Required Braces or Orthoses --  bilat knee braces PRN , orthotics     Balance Screen   Has the patient fallen in the past 6 months No     [place] residence  apartment - has elevator and stairs   Home Layout --  stairs at apartment. Takes one step at a time descending     Prior Function   Level of Independence Independent   Vocation Retired   Leisure walk dog, clean apartment - some trouble     Observation/Other Assessments   Focus on Therapeutic Outcomes (FOTO)  55% limited     Functional Tests   Functional tests Squat;Single leg stance     Squat   Comments knee pain immediately stopped.      Single Leg Stance   Comments Rt > 1sec , Lt 5 sec pain bilat knees.      ROM / Strength   AROM / PROM / Strength AROM;Strength     AROM   AROM Assessment Site Knee   Right/Left Knee --  full extension ,  flex Lt 78, Rt 148,pain      Strength   Strength Assessment Site Hip;Knee;Ankle   Right/Left Hip Right;Left   Right Hip Flexion 4+/5   Right Hip Extension --  5-/5   Right Hip ABduction 4-/5   Left Hip Flexion 4/5   Left Hip Extension 4/5   Left Hip ABduction 3+/5   Right/Left Knee --  bilat grossly 4+/5 with pain   Right/Left Ankle --  bilat NWL     Flexibility   Soft Tissue Assessment /Muscle Length yes   Hamstrings supine SLR Rt 80, Lt 62     Transfers   Comments UE assist to stand from sit due to pain     Ambulation/Gait   Ambulation/Gait Yes   Ambulation/Gait Assistance 7: Independent   Ambulation Distance (Feet) --  observed in clinic   Assistive device None   Gait Pattern Antalgic;Left foot flat;Left circumduction;Decreased weight shift to left;Decreased hip/knee flexion - left;Decreased stance time - left   Gait Comments pattern improves slightly with increased time being up.            Objective measurements completed on examination: See above findings.          [facility] PT Treatment/Exercise - 01/21/17 0001      Exercises   Exercises Knee/Hip     Knee/Hip Exercises: Supine   Quad Sets Strengthening;Both;1 set;10 reps  10sec holds   Straight Leg Raise with External Rotation Strengthening;Both;1 set;15 reps  VC for form     Knee/Hip Exercises: Sidelying   Hip ABduction Strengthening;Both;10 reps  VC for form     Modalities   Modalities Electrical Stimulation;Moist Heat     Moist Heat Therapy   Number Minutes Moist Heat 15 Minutes   Moist Heat Location Knee  bilat     Electrical Stimulation   Electrical Stimulation Location bilat knees   Electrical Stimulation Action premod   Electrical Stimulation Parameters to tolerance   Electrical Stimulation Goals Pain                PT Education - 01/21/17 1111    Education provided Yes   Education Details HEP   Person(s) Educated Patient   Methods  Explanation;Demonstration;Handout  Comprehension Returned demonstration;Verbalized understanding             PT Long Term Goals - 2017/02/20 1416      PT LONG TERM GOAL #1   Title I with advanced HEP ( 03/04/17)   Time 6   Period Weeks   Status New     PT LONG TERM GOAL #2   Title increase Lt knee ROM =/> 100 degrees to assist with stairs ( 03/04/17)    Time 6   Period Weeks   Status New     PT LONG TERM GOAL #3   Title improve bilat hip and knee strength =/> 4+/5 to assist with ability to walk her dog ( 03/04/17)    Time 6   Period Weeks   Status New     PT LONG TERM GOAL #4   Title report =/> 50% reduction in bilat knee pain with transitioning sit to stand ( 03/04/17)    Time 6   Period Weeks   Status New     PT LONG TERM GOAL #5   Title report =/> 50% improvement of patient ability to ambulate upon initial standing/walk ( 03/04/17)    Time 6   Period Weeks   Status New     Additional Long Term Goals   Additional Long Term Goals Yes     PT LONG TERM GOAL #6   Title improve FOTO =/< 45% limited , CK level ( 03/04/17)    Time 6   Period Weeks   Status New                Plan - February 20, 2017 1112    Clinical Impression Statement 66 yo female with significant pain in her Lt knee and some pain in the Rt.  She had a Lt knee scope about 19 yrs ago.  She has limited motion in the Lt knee, a lot of weakness in bilat hips and some in her knees.  She is very hesistant to contract her quads as it causes knee pain.  She is having significant gait deviations when she first stands up and walks, this settles down after about 15 steps.  She is limited in her ability to manuever stairs, walk her dog and perform household chores.    Clinical Presentation Evolving   Clinical Decision Making Low   Rehab Potential Good   PT Frequency 2x / week   PT Duration 6 weeks   PT Treatment/Interventions Moist Heat;Ultrasound;Therapeutic exercise;Dry needling;Taping;Manual  techniques;Neuromuscular re-education;Vasopneumatic Device;Cryotherapy;Electrical Stimulation;Iontophoresis 4mg /ml Dexamethasone;Patient/family education   PT Next Visit Plan progress ther ex, start with open chain and progress to closed chain as pain improves and she can tolerate, heat/e-stim PRN   Consulted and Agree with Plan of Care Patient      Patient will benefit from skilled therapeutic intervention in order to improve the following deficits and impairments:  Decreased range of motion, Difficulty walking, Pain, Decreased strength, Decreased balance  Visit Diagnosis: Acute pain of right knee - Plan: PT plan of care cert/re-cert  Chronic pain of left knee - Plan: PT plan of care cert/re-cert  Muscle weakness (generalized) - Plan: PT plan of care cert/re-cert  Other abnormalities of gait and mobility - Plan: PT plan of care cert/re-cert      [facility] PT PB G-CODES - Feb 20, 2017 1414    Functional Assessment Tool Used  FOTO and professional judgement   Functional Limitations Mobility: Walking and moving around   Mobility: Walking and Moving Around Current Status  At least 60 percent but less than 80 percent impaired, limited or restricted   Mobility: Walking and Moving Around Goal Status [phone]) At least 40 percent but less than 60 percent impaired, limited or restricted       Problem List Patient Active Problem List   Diagnosis Date Noted  . Right knee pain 12/11/2016  . 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 Sun-damaged skin 12/09/2016  . 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 Mild cognitive impairment 08/21/2016  . Mild single current episode of major depressive disorder ([place]) 08/21/2016  . Anxiety 08/21/2016  . Low back pain 08/10/2016  . Joint pain 08/08/2016  . Prolapsed internal hemorrhoids, grade 2 06/18/2016  . Cognitive change 06/10/2016  . Family history of colon cancer 05/19/2016  . Vitamin D deficiency 03/04/2016  . Deformity of metatarsal bone of  right foot 08/15/2015  . Metatarsalgia of right foot 08/15/2015  . Otalgia 04/02/2015  . Preventative health care 06/25/2014  . Sinusitis 12/09/2013  . Osteopenia 09/19/2013  . Hypothyroidism 09/08/2013  . Osteoarthritis, knee 09/06/2013  . Routine general medical examination at a health care facility 09/05/2013  . Dysphagia, unspecified(787.20) 07/19/2013  . Allergic rhinitis 11/16/2012  . Osteoarthritis of both hands 10/15/2012  . Peripheral neuropathy 12/19/2011  . 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 GERD (gastroesophageal reflux disease) 03/07/2011  . Fatty liver 12/19/2010  . Genital herpes 08/06/2010  . MAMMOGRAM, ABNORMAL 08/02/2010  . ABNORMAL ELECTROCARDIOGRAM 07/19/2010  . Obstructive sleep apnea 06/18/2010  . DM (diabetes mellitus), type 2 with neurological complications ([place]) 66/44/0347  . Hyperlipidemia 04/12/2010  . Essential hypertension 04/12/2010  . ASTHMA 04/12/2010  . DEPRESSION/ANXIETY 03/13/2010    Manuela Schwartz Talishia Betzler PT  01/21/2017, 2:22 PM  [facility] [place], [place], [address] Phone: [phone]   Fax:  [phone]  Name: Crystal [name] MRN: [id number] Date of Birth: 09-May-1951

## 2017-01-21 NOTE — Patient Instructions (Addendum)
TENS UNIT: This is helpful for muscle pain and spasm.   Search and Purchase a TENS 7000 2nd edition at www.tenspros.com. It should be less than $30.     TENS unit instructions: Do not shower or bathe with the unit on Turn the unit off before removing electrodes or batteries If the electrodes lose stickiness add a drop of water to the electrodes after they are disconnected from the unit and place on plastic sheet. If you continued to have difficulty, call the TENS unit company to purchase more electrodes. Do not apply lotion on the skin area prior to use. Make sure the skin is clean and dry as this will help prolong the life of the electrodes. After use, always check skin for unusual red areas, rash or other skin difficulties. If there are any skin problems, does not apply electrodes to the same area. Never remove the electrodes from the unit by pulling the wires. Do not use the TENS unit or electrodes other than as directed. Do not change electrode placement without consultating your therapist or physician. Keep 2 fingers with between each electrode. Wear time ratio is 2:1, on to off times.    For example on for 30 minutes off for 15 minutes and then on for 30 minutes off for 15 minutes    Home hot pack, use rice in an old sock. Heat in microwave 60-90 sec and use on knees.   Strengthening: Quadriceps Set - can lie down for this also.     Tighten muscles on top of thighs by pushing knees down into surface. Hold _10___ seconds. Repeat __10__ times per set. Do __1__ sets per session. Do __1-2__ sessions per day.   Straight Leg Raise: With External Leg Rotation    Lie on back with left leg straight, opposite leg bent. Rotate straight leg out and lift _6-8___ inches. Repeat __10__ times per set. Do __1-3__ sets per session. Do __1__ sessions per day. Repeat on the right side.   Strengthening: Hip Abduction (Side-Lying) - keep top leg behind the bottom.     Tighten muscles on  front of left thigh, then lift leg _8-10___ inches from surface, keeping knee locked.  Repeat __10__ times per set. Do __1-3__ sets per session. Do _1___ sessions per day. Repeat on the right side.

## 2017-01-22 NOTE — Progress Notes (Signed)
Subjective: 66 year old female presents the office for follow-up evaluation of right foot pain as well as calluses to the forefoot. She states that she injured about the same to the pads that I dispensed has limited help. She states the majority symptoms come from painful calluses on the ball of her foot on the right side. She denies any swelling redness or drainage from the areas. She has no other concerns today. Denies any systemic complaints such as fevers, chills, nausea, vomiting. No acute changes since last appointment, and no other complaints at this time.   Objective: AAO x3, NAD DP/PT pulses palpable bilaterally, CRT less than 3 seconds Decreased range of motion the first MTPJ of the right foot and there is hammertoe contractures present. The keratotic lesion submetatarsal 2. No underlying ulceration, drainage or any clinical signs of infection. The majority symptoms appear to be submetatarsal 2. No open lesions or pre-ulcerative lesions. There is no other areas of tenderness identified this time. No pain with calf compression, swelling, warmth, erythema  Assessment: Symptomatic hyperkeratotic lesions due to digital deformity/biomechanical changes  Plan: -All treatment options discussed with the patient including all alternatives, risks, complications.  -Lesions were sharply debrided without complications or bleeding. Discussed orthotic due to cost she will start an over-the-counter insert. Dispensed powersteps annular further modified to help offload the symptomatic areas. Also discussed shoe gear modifications. At this time will hold off on surgical intervention but if symptoms continue may re-visit this.  -Patient encouraged to call the office with any questions, concerns, change in symptoms.   Celesta Gentile, DPM

## 2017-01-30 ENCOUNTER — Ambulatory Visit (INDEPENDENT_AMBULATORY_CARE_PROVIDER_SITE_OTHER): Payer: Medicare HMO | Admitting: Physical Therapy

## 2017-01-30 DIAGNOSIS — M25561 Pain in right knee: Secondary | ICD-10-CM | POA: Diagnosis not present

## 2017-01-30 DIAGNOSIS — R2689 Other abnormalities of gait and mobility: Secondary | ICD-10-CM

## 2017-01-30 DIAGNOSIS — M6281 Muscle weakness (generalized): Secondary | ICD-10-CM | POA: Diagnosis not present

## 2017-01-30 DIAGNOSIS — M25562 Pain in left knee: Secondary | ICD-10-CM

## 2017-01-30 DIAGNOSIS — G8929 Other chronic pain: Secondary | ICD-10-CM | POA: Diagnosis not present

## 2017-01-30 NOTE — Therapy (Signed)
[place], [place], [address] Phone: [phone]   Fax:  [phone]  Physical Therapy Treatment  Patient Details  Name: Crystal [name] MRN: [id number] Date of Birth: 1951/06/17 Referring Provider: Dr. Barbaraann Barthel   Encounter Date: 01/30/2017      PT End of Session - 01/30/17 1340    Visit Number 2   Number of Visits 12   Date for PT Re-Evaluation 03/04/17   PT Start Time 3474   PT Stop Time 1417   PT Time Calculation (min) 44 min      Past Medical History:  Diagnosis Date  . Acute peptic ulcer of stomach    As a teenager  . Allergy    dust, cock roach,grass,weeds,molds  . Anxiety   . Asthma    02/08/10 FEV1 1.98 (80%), s/p saba 2.22 l/m (90%).nml  . Cataract   . Cognitive change 06/10/2016  . Depression   . Diabetes mellitus 2003   Type II  . Diverticulosis   . Dyspepsia   . Fatty liver 12/19/2010  . GERD (gastroesophageal reflux disease)   . High cholesterol   . Hyperlipemia   . Hypertension   . Hypothyroidism 09/08/2013  . IBS (irritable bowel syndrome)   . Internal hemorrhoids   . Joint pain 08/08/2016  . Low back pain 08/10/2016  . OSA (obstructive sleep apnea)   . Plantar fasciitis   . Sun-damaged skin 12/09/2016  . Vitamin D deficiency 03/04/2016    Past Surgical History:  Procedure Laterality Date  . APPENDECTOMY     age 46  . CATARACT EXTRACTION, BILATERAL     12/8 and 12/14  . COLONOSCOPY  06/03/2010, 08/25/2011   diverticulosis   . GASTRECTOMY     partial - ulcer as a teen  . HAND SURGERY Right   . HEMORRHOID BANDING    . KNEE ARTHROSCOPY Left 1998  . NASAL SEPTUM SURGERY    . RHINOPLASTY    . TOE SURGERY Right 1995    There were no vitals filed for this visit.      Subjective Assessment - 01/30/17 1340    Subjective Pt reports she has been using heat on knees for pain relief.  She hasn't had much time to do the exercises since last visit.  She has  stayed busy with moving her daughter and walking her dog, Jodell Cipro.    Currently in Pain? Yes   Pain Score 6    Pain Location Knee   Pain Orientation Left   Aggravating Factors  being still, stairs   Pain Relieving Factors after moving a bit, heat.             [facility] PT Assessment - 01/30/17 0001      Assessment   Medical Diagnosis Rt knee pain, Lt knee pain   Referring Provider Dr. Barbaraann Barthel    Onset Date/Surgical Date 09/21/16   Hand Dominance Right   Next MD Visit PRN   Prior Therapy years ago Lt 1999     AROM   AROM Assessment Site Knee     Flexibility   Soft Tissue Assessment /Muscle Length yes   Hamstrings Lt 82 deg, Rt 87   Quadriceps 134 deg Rt knee, 117 deg Lt knee          [facility] Adult PT Treatment/Exercise - 01/30/17 0001      Knee/Hip Exercises: Stretches   Passive Hamstring Stretch Right;Left;3 reps;30 seconds  seated, straight back   Passive  Hamstring Stretch Limitations then supine with strap x 30 sec x 2 reps   Quad Stretch Left;Right;2 reps;60 seconds   Gastroc Stretch Right;Left;2 reps;30 seconds     Knee/Hip Exercises: Supine   Straight Leg Raise with External Rotation Left;Right;1 set;5 reps, 2 sets     Moist Heat Therapy   Number Minutes Moist Heat 15 Minutes   Moist Heat Location Knee  bilat     Electrical Stimulation   Electrical Stimulation Location bilat medial knees    Electrical Stimulation Action premod to each area   Electrical Stimulation Parameters to tolerance    Electrical Stimulation Goals Pain     Manual Therapy   Manual Therapy Taping   Kinesiotex Edema     Kinesiotix   Edema 2- I strips of Rock tape applied with 50% stretch over Lt pes anserine to decompress tissue and decrease pain.                  PT Education - 01/30/17 1404    Education provided Yes   Education Details HEP, kinesiotape info   Person(s) Educated Patient   Methods Explanation;Handout   Comprehension Verbalized understanding;Returned  demonstration             PT Long Term Goals - 01/30/17 1402      PT LONG TERM GOAL #1   Title I with advanced HEP ( 03/04/17)   Time 6   Period Weeks   Status On-going     PT LONG TERM GOAL #2   Title increase Lt knee ROM =/> 100 degrees to assist with stairs ( 03/04/17)    Time 6   Period Weeks   Status Achieved     PT LONG TERM GOAL #3   Title improve bilat hip and knee strength =/> 4+/5 to assist with ability to walk her dog ( 03/04/17)    Time 6   Period Weeks   Status On-going     PT LONG TERM GOAL #4   Title report =/> 50% reduction in bilat knee pain with transitioning sit to stand ( 03/04/17)    Time 6   Period Weeks   Status On-going     PT LONG TERM GOAL #5   Title report =/> 50% improvement of patient ability to ambulate upon initial standing/walk ( 03/04/17)    Time 6   Period Weeks   Status On-going     PT LONG TERM GOAL #6   Title improve FOTO =/< 45% limited , CK level ( 03/04/17)    Time 6   Period Weeks   Status On-going               Plan - 01/30/17 1548    Clinical Impression Statement Pt initially hesitant to performing stretches and AROM with Lt knee due to pain.  With encouragement and education, pt more receptive to completing exercise and reported decreased pain afterwards.  Pt progressing towards goals.  She will benefit from continued PT intervention to max functional mobility.    Rehab Potential Good   PT Frequency 2x / week   PT Duration 6 weeks   PT Treatment/Interventions Moist Heat;Ultrasound;Therapeutic exercise;Dry needling;Taping;Manual techniques;Neuromuscular re-education;Vasopneumatic Device;Cryotherapy;Electrical Stimulation;Iontophoresis 4mg /ml Dexamethasone;Patient/family education   PT Next Visit Plan assess response to exercises issued today. assess response to [facility] tape application.  Progress HEP as tolerated.    Consulted and Agree with Plan of Care Patient      Patient will benefit from skilled therapeutic  intervention in  order to improve the following deficits and impairments:  Decreased range of motion, Difficulty walking, Pain, Decreased strength, Decreased balance  Visit Diagnosis: Acute pain of right knee  Chronic pain of left knee  Muscle weakness (generalized)  Other abnormalities of gait and mobility     Problem List Patient Active Problem List   Diagnosis Date Noted  . Right knee pain 12/11/2016  . 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 Sun-damaged skin 12/09/2016  . 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 Mild cognitive impairment 08/21/2016  . Mild single current episode of major depressive disorder ([place]) 08/21/2016  . Anxiety 08/21/2016  . Low back pain 08/10/2016  . Joint pain 08/08/2016  . Prolapsed internal hemorrhoids, grade 2 06/18/2016  . Cognitive change 06/10/2016  . Family history of colon cancer 05/19/2016  . Vitamin D deficiency 03/04/2016  . Deformity of metatarsal bone of right foot 08/15/2015  . Metatarsalgia of right foot 08/15/2015  . Otalgia 04/02/2015  . Preventative health care 06/25/2014  . Sinusitis 12/09/2013  . Osteopenia 09/19/2013  . Hypothyroidism 09/08/2013  . Osteoarthritis, knee 09/06/2013  . Routine general medical examination at a health care facility 09/05/2013  . Dysphagia, unspecified(787.20) 07/19/2013  . Allergic rhinitis 11/16/2012  . Osteoarthritis of both hands 10/15/2012  . Peripheral neuropathy 12/19/2011  . 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 GERD (gastroesophageal reflux disease) 03/07/2011  . Fatty liver 12/19/2010  . Genital herpes 08/06/2010  . MAMMOGRAM, ABNORMAL 08/02/2010  . ABNORMAL ELECTROCARDIOGRAM 07/19/2010  . Obstructive sleep apnea 06/18/2010  . DM (diabetes mellitus), type 2 with neurological complications ([place]) 27/12/2374  . Hyperlipidemia 04/12/2010  . Essential hypertension 04/12/2010  . ASTHMA 04/12/2010  . DEPRESSION/ANXIETY 03/13/2010   Kerin Perna, PTA 01/30/17 3:52 PM  [facility] [place], [place], [address] Phone: [phone]   Fax:  [phone]  Name: Shaasia Odle MRN: [id number] Date of Birth: Dec 04, 1950

## 2017-01-30 NOTE — Patient Instructions (Addendum)
Calf Stretch    Place hands on wall at shoulder height. Keeping back leg straight, bend front leg, feet pointing forward, heels flat on floor. Lean forward slightly until stretch is felt in calf of back leg. Hold stretch _30__ seconds, breathing slowly in and out. Repeat stretch with other leg back. Do _2__ sessions per day.  (Can do this by hanging heels off of a step) Variation: Use chair or table for support.  Hamstring Step 1    Straighten knee, with foot in strap. Keep knee level with other knee or on bolster. Hold _30__ seconds. Relax knee by returning foot to start. Repeat _2__ times.  2 x/day  KNEE: Quadriceps - Prone    Place strap around ankle. Bring ankle toward buttocks. Press hip into surface. Hold __30_ seconds. _2-3__ reps per set, __2_ sets per day, _5__ days per week Kinesiology tape What is kinesiology tape?  There are many brands of kinesiology tape.  KTape, [employer], [employer], Dynamic tape, to name a few. It is an elasticized tape designed to support the body's natural healing process. This tape provides stability and support to muscles and joints without restricting motion. It can also help decrease swelling in the area of application. How does it work? The tape microscopically lifts and decompresses the skin to allow for drainage of lymph (swelling) to flow away from area, reducing inflammation.  The tape has the ability to help re-educate the neuromuscular system by targeting specific receptors in the skin.  The presence of the tape increases the body's awareness of posture and body mechanics.  Contraindications: . Open wounds . Skin lesions . Adhesive allergies Safe removal of the tape: In some rare cases, mild/moderate skin irritation can occur.  This can include redness, itchiness, or hives. If this occurs, immediately remove tape and consult your primary care physician if symptoms are severe or do not resolve within 2 days.  To remove tape safely, hold  nearby skin with one hand and gentle roll tape down with other hand.  You can apply oil or conditioner to tape while in shower prior to removal to loosen adhesive.  DO NOT swiftly rip tape off like a band-aid, as this could cause skin tears and additional skin irritation.   [facility] at [facility] [place], [place] [address]  [phone] (office) [phone] (fax)

## 2017-02-02 MED FILL — valACYclovir HCL 1 GM TABS: 1 | 30 days supply | Qty: 60 | Fill #0

## 2017-02-03 ENCOUNTER — Ambulatory Visit (INDEPENDENT_AMBULATORY_CARE_PROVIDER_SITE_OTHER): Payer: Medicare HMO | Admitting: Physical Therapy

## 2017-02-03 ENCOUNTER — Ambulatory Visit (INDEPENDENT_AMBULATORY_CARE_PROVIDER_SITE_OTHER): Payer: Medicare HMO | Admitting: Psychology

## 2017-02-03 ENCOUNTER — Encounter: Payer: Self-pay | Admitting: Physical Therapy

## 2017-02-03 DIAGNOSIS — G8929 Other chronic pain: Secondary | ICD-10-CM | POA: Diagnosis not present

## 2017-02-03 DIAGNOSIS — M25561 Pain in right knee: Secondary | ICD-10-CM

## 2017-02-03 DIAGNOSIS — F331 Major depressive disorder, recurrent, moderate: Secondary | ICD-10-CM | POA: Diagnosis not present

## 2017-02-03 DIAGNOSIS — M25562 Pain in left knee: Secondary | ICD-10-CM | POA: Diagnosis not present

## 2017-02-03 DIAGNOSIS — M6281 Muscle weakness (generalized): Secondary | ICD-10-CM

## 2017-02-03 DIAGNOSIS — R2689 Other abnormalities of gait and mobility: Secondary | ICD-10-CM

## 2017-02-03 NOTE — Therapy (Signed)
[place], [place], [address] Phone: [phone]   Fax:  [phone]  Physical Therapy Treatment  Patient Details  Name: Crystal [name] MRN: [id number] Date of Birth: 02/21/1951 Referring Provider: Dr. Barbaraann Barthel   Encounter Date: 02/03/2017      PT End of Session - 02/03/17 1153    Visit Number 3   Number of Visits 12   Date for PT Re-Evaluation 03/04/17   PT Start Time 1150   PT Stop Time 1250   PT Time Calculation (min) 60 min   Activity Tolerance Patient tolerated treatment well      Past Medical History:  Diagnosis Date  . Acute peptic ulcer of stomach    As a teenager  . Allergy    dust, cock roach,grass,weeds,molds  . Anxiety   . Asthma    02/08/10 FEV1 1.98 (80%), s/p saba 2.22 l/m (90%).nml  . Cataract   . Cognitive change 06/10/2016  . Depression   . Diabetes mellitus 2003   Type II  . Diverticulosis   . Dyspepsia   . Fatty liver 12/19/2010  . GERD (gastroesophageal reflux disease)   . High cholesterol   . Hyperlipemia   . Hypertension   . Hypothyroidism 09/08/2013  . IBS (irritable bowel syndrome)   . Internal hemorrhoids   . Joint pain 08/08/2016  . Low back pain 08/10/2016  . OSA (obstructive sleep apnea)   . Plantar fasciitis   . Sun-damaged skin 12/09/2016  . Vitamin D deficiency 03/04/2016    Past Surgical History:  Procedure Laterality Date  . APPENDECTOMY     age 55  . CATARACT EXTRACTION, BILATERAL     12/8 and 12/14  . COLONOSCOPY  06/03/2010, 08/25/2011   diverticulosis   . GASTRECTOMY     partial - ulcer as a teen  . HAND SURGERY Right   . HEMORRHOID BANDING    . KNEE ARTHROSCOPY Left 1998  . NASAL SEPTUM SURGERY    . RHINOPLASTY    . TOE SURGERY Right 1995    There were no vitals filed for this visit.      Subjective Assessment - 02/03/17 1152    Currently in Pain? Yes   Pain Score 4   Lt 4/10, Rt 2/10   Pain Location Knee   Pain  Orientation Right;Left   Pain Descriptors / Indicators Sharp   Pain Type Chronic pain   Pain Onset More than a month ago   Pain Frequency Constant   Aggravating Factors  stairs   Pain Relieving Factors heat and tape                         [facility] Adult PT Treatment/Exercise - 02/03/17 0001      Knee/Hip Exercises: Stretches   Sports administrator Both;3 reps;30 seconds  prone with quad.    Piriformis Stretch 3 reps;30 seconds  supine travell    Other Knee/Hip Stretches 3x30sec cross body stretch with strap.      Knee/Hip Exercises: Aerobic   Nustep L4 x 5' VC for form     Knee/Hip Exercises: Standing   Heel Raises Both;15 reps  VC to stand tall.    Wall Squat Limitations attempted - had increased knee pain so stopped.    Other Standing Knee Exercises 10 wt shifts BWD each side to activiate the quad     Knee/Hip Exercises: Supine   Single Leg Bridge Both;Strengthening  2x8 reps  keeping straight leg engaged and straight.      Modalities   Modalities Electrical Stimulation;Moist Heat     Moist Heat Therapy   Number Minutes Moist Heat 15 Minutes   Moist Heat Location Knee  bilat     Electrical Stimulation   Electrical Stimulation Location bilat knees   Electrical Stimulation Action premod   Electrical Stimulation Parameters to tolerance   Electrical Stimulation Goals Pain     Manual Therapy   Manual Therapy Taping   Kinesiotex Edema     Kinesiotix   Edema 2- I strips of Rock tape applied with 50% stretch over Lt pes anserine to decompress tissue and decrease pain.  In an "X", a long strip                PT Education - 02/03/17 1216    Education provided Yes   Education Details HEP - piriformis stretch   Person(s) Educated Patient   Methods Explanation;Handout   Comprehension Returned demonstration             PT Long Term Goals - 02/03/17 1238      PT LONG TERM GOAL #1   Title I with advanced HEP ( 03/04/17)   Status On-going     PT  LONG TERM GOAL #2   Title increase Lt knee ROM =/> 100 degrees to assist with stairs ( 03/04/17)    Status Achieved     PT LONG TERM GOAL #3   Title improve bilat hip and knee strength =/> 4+/5 to assist with ability to walk her dog ( 03/04/17)    Status On-going     PT LONG TERM GOAL #4   Title report =/> 50% reduction in bilat knee pain with transitioning sit to stand ( 03/04/17)    Status On-going     PT LONG TERM GOAL #5   Title report =/> 50% improvement of patient ability to ambulate upon initial standing/walk ( 03/04/17)    Status On-going     PT LONG TERM GOAL #6   Title improve FOTO =/< 45% limited , CK level ( 03/04/17)    Status On-going               Plan - 02/03/17 1236    Clinical Impression Statement Crystal [name] reports the tape helped, also states she is not needing as much pain meds and is able to move better at home.  Making progress to goals. Still not able to tolerate a lot of exercise in weightbearing due to pain.    Rehab Potential Good   PT Frequency 2x / week   PT Duration 6 weeks   PT Treatment/Interventions Moist Heat;Ultrasound;Therapeutic exercise;Dry needling;Taping;Manual techniques;Neuromuscular re-education;Vasopneumatic Device;Cryotherapy;Electrical Stimulation;Iontophoresis 4mg /ml Dexamethasone;Patient/family education   PT Next Visit Plan retape PRN, LE strengthening and modalties for pain PRN    Consulted and Agree with Plan of Care Patient      Patient will benefit from skilled therapeutic intervention in order to improve the following deficits and impairments:  Decreased range of motion, Difficulty walking, Pain, Decreased strength, Decreased balance  Visit Diagnosis: Acute pain of right knee  Chronic pain of left knee  Other abnormalities of gait and mobility  Muscle weakness (generalized)     Problem List Patient Active Problem List   Diagnosis Date Noted  . Right knee pain 12/11/2016  . 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  Sun-damaged skin 12/09/2016  . 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 Mild cognitive impairment  08/21/2016  . Mild single current episode of major depressive disorder ([place]) 08/21/2016  . Anxiety 08/21/2016  . Low back pain 08/10/2016  . Joint pain 08/08/2016  . Prolapsed internal hemorrhoids, grade 2 06/18/2016  . Cognitive change 06/10/2016  . Family history of colon cancer 05/19/2016  . Vitamin D deficiency 03/04/2016  . Deformity of metatarsal bone of right foot 08/15/2015  . Metatarsalgia of right foot 08/15/2015  . Otalgia 04/02/2015  . Preventative health care 06/25/2014  . Sinusitis 12/09/2013  . Osteopenia 09/19/2013  . Hypothyroidism 09/08/2013  . Osteoarthritis, knee 09/06/2013  . Routine general medical examination at a health care facility 09/05/2013  . Dysphagia, unspecified(787.20) 07/19/2013  . Allergic rhinitis 11/16/2012  . Osteoarthritis of both hands 10/15/2012  . Peripheral neuropathy 12/19/2011  . 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 GERD (gastroesophageal reflux disease) 03/07/2011  . Fatty liver 12/19/2010  . Genital herpes 08/06/2010  . MAMMOGRAM, ABNORMAL 08/02/2010  . ABNORMAL ELECTROCARDIOGRAM 07/19/2010  . Obstructive sleep apnea 06/18/2010  . DM (diabetes mellitus), type 2 with neurological complications ([place]) 35/82/5189  . Hyperlipidemia 04/12/2010  . Essential hypertension 04/12/2010  . ASTHMA 04/12/2010  . DEPRESSION/ANXIETY 03/13/2010    Jeral Pinch PT  02/03/2017, 12:39 PM  [facility] [place], [place], [address] Phone: [phone]   Fax:  [phone]  Name: Crystal [name] MRN: [id number] Date of Birth: 1950-10-08

## 2017-02-03 NOTE — Patient Instructions (Addendum)
   Piriformis Stretch   Lying on back, pull right knee toward opposite shoulder. Hold 30 seconds. Repeat 3 times. Do 2-3 sessions per day.   TENS UNIT: This is helpful for muscle pain and spasm.   Search and Purchase a TENS 7000 2nd edition at www.tenspros.com. It should be less than $30.     TENS unit instructions: Do not shower or bathe with the unit on Turn the unit off before removing electrodes or batteries If the electrodes lose stickiness add a drop of water to the electrodes after they are disconnected from the unit and place on plastic sheet. If you continued to have difficulty, call the TENS unit company to purchase more electrodes. Do not apply lotion on the skin area prior to use. Make sure the skin is clean and dry as this will help prolong the life of the electrodes. After use, always check skin for unusual red areas, rash or other skin difficulties. If there are any skin problems, does not apply electrodes to the same area. Never remove the electrodes from the unit by pulling the wires. Do not use the TENS unit or electrodes other than as directed. Do not change electrode placement without consultating your therapist or physician. Keep 2 fingers with between each electrode. Wear time ratio is 2:1, on to off times.    For example on for 30 minutes off for 15 minutes and then on for 30 minutes off for 15 minutes

## 2017-02-06 ENCOUNTER — Ambulatory Visit (INDEPENDENT_AMBULATORY_CARE_PROVIDER_SITE_OTHER): Payer: Medicare HMO | Admitting: Physical Therapy

## 2017-02-06 DIAGNOSIS — R2689 Other abnormalities of gait and mobility: Secondary | ICD-10-CM

## 2017-02-06 DIAGNOSIS — M25562 Pain in left knee: Secondary | ICD-10-CM

## 2017-02-06 DIAGNOSIS — M6281 Muscle weakness (generalized): Secondary | ICD-10-CM | POA: Diagnosis not present

## 2017-02-06 DIAGNOSIS — G8929 Other chronic pain: Secondary | ICD-10-CM | POA: Diagnosis not present

## 2017-02-06 NOTE — Therapy (Signed)
[place], [place], [address] Phone: [phone]   Fax:  [phone]  Physical Therapy Treatment  Patient Details  Name: Crystal [name] MRN: [id number] Date of Birth: Nov 21, 1950 Referring Provider: Dr. Barbaraann Barthel   Encounter Date: 02/06/2017      PT End of Session - 02/06/17 1429    Visit Number 4   Number of Visits 12   Date for PT Re-Evaluation 03/04/17   PT Start Time 9509   PT Stop Time 1408   PT Time Calculation (min) 33 min   Activity Tolerance Patient tolerated treatment well;No increased pain   Behavior During Therapy WFL for tasks assessed/performed      Past Medical History:  Diagnosis Date  . Acute peptic ulcer of stomach    As a teenager  . Allergy    dust, cock roach,grass,weeds,molds  . Anxiety   . Asthma    02/08/10 FEV1 1.98 (80%), s/p saba 2.22 l/m (90%).nml  . Cataract   . Cognitive change 06/10/2016  . Depression   . Diabetes mellitus 2003   Type II  . Diverticulosis   . Dyspepsia   . Fatty liver 12/19/2010  . GERD (gastroesophageal reflux disease)   . High cholesterol   . Hyperlipemia   . Hypertension   . Hypothyroidism 09/08/2013  . IBS (irritable bowel syndrome)   . Internal hemorrhoids   . Joint pain 08/08/2016  . Low back pain 08/10/2016  . OSA (obstructive sleep apnea)   . Plantar fasciitis   . Sun-damaged skin 12/09/2016  . Vitamin D deficiency 03/04/2016    Past Surgical History:  Procedure Laterality Date  . APPENDECTOMY     age 1  . CATARACT EXTRACTION, BILATERAL     12/8 and 12/14  . COLONOSCOPY  06/03/2010, 08/25/2011   diverticulosis   . GASTRECTOMY     partial - ulcer as a teen  . HAND SURGERY Right   . HEMORRHOID BANDING    . KNEE ARTHROSCOPY Left 1998  . NASAL SEPTUM SURGERY    . RHINOPLASTY    . TOE SURGERY Right 1995    There were no vitals filed for this visit.      Subjective Assessment - 02/06/17 1340    Subjective Pt  reports her pain has decreased significantly; able to ascend/descend stairs and walk dog with less pain.  She is stretching 3-4x/wk. Has increased awareness of LLE tightness, which has prompted her to stretch   Currently in Pain? Yes   Pain Score 2   RLE 0/10   Pain Orientation Left, knee    Pain Descriptors / Indicators Aching;Tightness   Aggravating Factors  stairs   Pain Relieving Factors tape            [facility] PT Assessment - 02/06/17 0001      Flexibility   Hamstrings Lt 84 deg.    Quadriceps Rt knee 134 deg, Lt knee 120 deg            [facility] Adult PT Treatment/Exercise - 02/06/17 0001      Knee/Hip Exercises: Stretches   Passive Hamstring Stretch Right;1 rep;Left;3 reps;30 seconds   Quad Stretch Left;4 reps;60 seconds   Piriformis Stretch Left;1 rep;30 seconds   Gastroc Stretch Right;Left;2 reps;60 seconds  heels off of step   Other Knee/Hip Stretches Lt hip adductor stretch supine with strap x 45 sec x 3 reps.      Manual Therapy   Manual Therapy Taping;Myofascial release;Soft tissue  mobilization   Manual therapy comments I strip of reg Rock tape applied to Lt distal semimembranosus tendon to pes anserine with 25% stretch, perpendicular strip applied at pes anserine with 50% stretch to decompress area, increase proprioception, and decrease pain.    Soft tissue mobilization STM to Lt hamstring, calf, adductors, quad    Myofascial Release MFR to Lt adductor group, Lt quad                     PT Long Term Goals - 02/03/17 1238      PT LONG TERM GOAL #1   Title I with advanced HEP ( 03/04/17)   Status On-going     PT LONG TERM GOAL #2   Title increase Lt knee ROM =/> 100 degrees to assist with stairs ( 03/04/17)    Status Achieved     PT LONG TERM GOAL #3   Title improve bilat hip and knee strength =/> 4+/5 to assist with ability to walk her dog ( 03/04/17)    Status On-going     PT LONG TERM GOAL #4   Title report =/> 50% reduction in bilat knee  pain with transitioning sit to stand ( 03/04/17)    Status On-going     PT LONG TERM GOAL #5   Title report =/> 50% improvement of patient ability to ambulate upon initial standing/walk ( 03/04/17)    Status On-going     PT LONG TERM GOAL #6   Title improve FOTO =/< 45% limited , CK level ( 03/04/17)    Status On-going               Plan - 02/06/17 1439    Clinical Impression Statement Pt has had positive response to last few sessions; reporting overall decrease in bilat knee pain.  Notable tightness/tenderness in Lt quad (mid-belly), Lt semimembranosus, and Lt adductors with palpation; reduced tissue tightness with manual therapy- reported reduction of pain to 0-1/10 at end of session.  Pt may benefit from DN in future session to these areas.  Hamstring/quad flexibility improving gradually.  Progressing towards goals.    Rehab Potential Good   PT Frequency 2x / week   PT Duration 6 weeks   PT Treatment/Interventions Moist Heat;Ultrasound;Therapeutic exercise;Dry needling;Taping;Manual techniques;Neuromuscular re-education;Vasopneumatic Device;Cryotherapy;Electrical Stimulation;Iontophoresis 4mg /ml Dexamethasone;Patient/family education   PT Next Visit Plan DN to LLE.     Consulted and Agree with Plan of Care Patient      Patient will benefit from skilled therapeutic intervention in order to improve the following deficits and impairments:  Decreased range of motion, Difficulty walking, Pain, Decreased strength, Decreased balance  Visit Diagnosis: Chronic pain of left knee  Other abnormalities of gait and mobility  Muscle weakness (generalized)     Problem List Patient Active Problem List   Diagnosis Date Noted  . Right knee pain 12/11/2016  . 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 Sun-damaged skin 12/09/2016  . 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 Mild cognitive impairment 08/21/2016  . Mild single current episode of major depressive disorder ([place])  08/21/2016  . Anxiety 08/21/2016  . Low back pain 08/10/2016  . Joint pain 08/08/2016  . Prolapsed internal hemorrhoids, grade 2 06/18/2016  . Cognitive change 06/10/2016  . Family history of colon cancer 05/19/2016  . Vitamin D deficiency 03/04/2016  . Deformity of metatarsal bone of right foot 08/15/2015  . Metatarsalgia of right foot 08/15/2015  . Otalgia 04/02/2015  . Preventative health care 06/25/2014  .  Sinusitis 12/09/2013  . Osteopenia 09/19/2013  . Hypothyroidism 09/08/2013  . Osteoarthritis, knee 09/06/2013  . Routine general medical examination at a health care facility 09/05/2013  . Dysphagia, unspecified(787.20) 07/19/2013  . Allergic rhinitis 11/16/2012  . Osteoarthritis of both hands 10/15/2012  . Peripheral neuropathy 12/19/2011  . 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 GERD (gastroesophageal reflux disease) 03/07/2011  . Fatty liver 12/19/2010  . Genital herpes 08/06/2010  . MAMMOGRAM, ABNORMAL 08/02/2010  . ABNORMAL ELECTROCARDIOGRAM 07/19/2010  . Obstructive sleep apnea 06/18/2010  . DM (diabetes mellitus), type 2 with neurological complications ([place]) 66/81/5947  . Hyperlipidemia 04/12/2010  . Essential hypertension 04/12/2010  . ASTHMA 04/12/2010  . DEPRESSION/ANXIETY 03/13/2010   Kerin Perna, PTA 02/06/17 2:52 PM  [place], [place], [address] Phone: [phone]   Fax:  [phone]  Name: Crystal [name] MRN: [id number] Date of Birth: January 03, 1951

## 2017-02-10 ENCOUNTER — Ambulatory Visit (INDEPENDENT_AMBULATORY_CARE_PROVIDER_SITE_OTHER): Payer: Medicare HMO | Admitting: Physical Therapy

## 2017-02-10 DIAGNOSIS — G8929 Other chronic pain: Secondary | ICD-10-CM

## 2017-02-10 DIAGNOSIS — R2689 Other abnormalities of gait and mobility: Secondary | ICD-10-CM | POA: Diagnosis not present

## 2017-02-10 DIAGNOSIS — M6281 Muscle weakness (generalized): Secondary | ICD-10-CM | POA: Diagnosis not present

## 2017-02-10 DIAGNOSIS — M25562 Pain in left knee: Secondary | ICD-10-CM | POA: Diagnosis not present

## 2017-02-10 DIAGNOSIS — M25561 Pain in right knee: Secondary | ICD-10-CM | POA: Diagnosis not present

## 2017-02-10 NOTE — Patient Instructions (Signed)

## 2017-02-10 NOTE — Therapy (Signed)
[place], [place], [address] Phone: [phone]   Fax:  [phone]  Physical Therapy Treatment  Patient Details  Name: Crystal [name] MRN: [id number] Date of Birth: Oct 21, 1950 Referring Provider: Dr. Barbaraann Barthel   Encounter Date: 02/10/2017      PT End of Session - 02/10/17 1108    Visit Number 5   Number of Visits 12   Date for PT Re-Evaluation 03/04/17   PT Start Time 1103   PT Stop Time 1153   PT Time Calculation (min) 50 min      Past Medical History:  Diagnosis Date  . Acute peptic ulcer of stomach    As a teenager  . Allergy    dust, cock roach,grass,weeds,molds  . Anxiety   . Asthma    02/08/10 FEV1 1.98 (80%), s/p saba 2.22 l/m (90%).nml  . Cataract   . Cognitive change 06/10/2016  . Depression   . Diabetes mellitus 2003   Type II  . Diverticulosis   . Dyspepsia   . Fatty liver 12/19/2010  . GERD (gastroesophageal reflux disease)   . High cholesterol   . Hyperlipemia   . Hypertension   . Hypothyroidism 09/08/2013  . IBS (irritable bowel syndrome)   . Internal hemorrhoids   . Joint pain 08/08/2016  . Low back pain 08/10/2016  . OSA (obstructive sleep apnea)   . Plantar fasciitis   . Sun-damaged skin 12/09/2016  . Vitamin D deficiency 03/04/2016    Past Surgical History:  Procedure Laterality Date  . APPENDECTOMY     age 15  . CATARACT EXTRACTION, BILATERAL     12/8 and 12/14  . COLONOSCOPY  06/03/2010, 08/25/2011   diverticulosis   . GASTRECTOMY     partial - ulcer as a teen  . HAND SURGERY Right   . HEMORRHOID BANDING    . KNEE ARTHROSCOPY Left 1998  . NASAL SEPTUM SURGERY    . RHINOPLASTY    . TOE SURGERY Right 1995    There were no vitals filed for this visit.      Subjective Assessment - 02/10/17 1107    Subjective Pt had to help family and friends move this weekend, that and the weather is causing her some increase in pain.  She states that all that  is done now and she will be able to focus on herself.    Patient Stated Goals walk without pain or minimal pain, walk her dog - goes up /down stairs 3-4 time a day.    Currently in Pain? Yes   Pain Score 6    Pain Location Knee   Pain Orientation Left  Rt 4/10   Pain Descriptors / Indicators Aching   Pain Type Chronic pain   Pain Onset More than a month ago   Pain Frequency Constant   Aggravating Factors  stairs and weather   Pain Relieving Factors tape some            [facility] PT Assessment - 02/10/17 0001      Strength   Right Hip Flexion 5/5   Right Hip Extension 5/5   Right Hip ABduction 4+/5   Left Hip Flexion 4+/5   Left Hip Extension 4+/5   Left Hip ABduction 4+/5                     [facility] Adult PT Treatment/Exercise - 02/10/17 0001      Knee/Hip Exercises: [employer]  Stretch Left;2 reps;30 seconds  prone with strap   Hip Flexor Stretch Left;3 reps;30 seconds  off edge of bed     Knee/Hip Exercises: Aerobic   Nustep L4 x 6' VC for form     Modalities   Modalities Electrical Stimulation;Moist Heat     Moist Heat Therapy   Number Minutes Moist Heat 15 Minutes   Moist Heat Location Knee  bilat and Lt quad     Electrical Stimulation   Electrical Stimulation Location Lt quad   Electrical Stimulation Action IFC    Electrical Stimulation Parameters to toleracne   Electrical Stimulation Goals Tone;Pain     Manual Therapy   Manual Therapy Myofascial release;Soft tissue mobilization   Soft tissue mobilization STM to Lt quad, vastus lateralis and rectus femoris. more tightness in rectus.           Trigger Point Dry Needling - 02/10/17 1132    Consent Given? Yes   Education Handout Provided Yes   Muscles Treated Lower Body Quadriceps  Lt               PT Education - 02/10/17 1130    Education provided Yes   Education Details DN   Person(s) Educated Patient   Methods Explanation;Handout   Comprehension Verbalized  understanding             PT Long Term Goals - 02/10/17 1109      PT LONG TERM GOAL #1   Title I with advanced HEP ( 03/04/17)   Status On-going     PT LONG TERM GOAL #2   Title increase Lt knee ROM =/> 100 degrees to assist with stairs ( 03/04/17)    Status Achieved     PT LONG TERM GOAL #3   Title improve bilat hip and knee strength =/> 4+/5 to assist with ability to walk her dog ( 03/04/17)    Status Achieved     PT LONG TERM GOAL #4   Title report =/> 50% reduction in bilat knee pain with transitioning sit to stand ( 03/04/17)      PT LONG TERM GOAL #5   Title report =/> 50% improvement of patient ability to ambulate upon initial standing/walk ( 03/04/17)    Status Achieved  70% improvement     PT LONG TERM GOAL #6   Title improve FOTO =/< 45% limited , CK level ( 03/04/17)    Status On-going               Plan - 02/10/17 1846    Clinical Impression Statement Crystal [name] reports some increase in bilat knee pain today as she was very active over the weekend and the weather is raining.  She tolerated DN and manual work to her Lt quad with a report of 50% decrease of symptoms after treatment.  She has partailly met her goals and making progress to the rest.    Rehab Potential Good   PT Frequency 2x / week   PT Duration 6 weeks   PT Treatment/Interventions Moist Heat;Ultrasound;Therapeutic exercise;Dry needling;Taping;Manual techniques;Neuromuscular re-education;Vasopneumatic Device;Cryotherapy;Electrical Stimulation;Iontophoresis 79m/ml Dexamethasone;Patient/family education   PT Next Visit Plan assess response to DN   Consulted and Agree with Plan of Care Patient      Patient will benefit from skilled therapeutic intervention in order to improve the following deficits and impairments:  Decreased range of motion, Difficulty walking, Pain, Decreased strength, Decreased balance  Visit Diagnosis: Chronic pain of left knee  Other abnormalities of gait and  mobility  Muscle weakness (generalized)  Acute pain of right knee     Problem List Patient Active Problem List   Diagnosis Date Noted  . Right knee pain 12/11/2016  . 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 Sun-damaged skin 12/09/2016  . 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 Mild cognitive impairment 08/21/2016  . Mild single current episode of major depressive disorder ([place]) 08/21/2016  . Anxiety 08/21/2016  . Low back pain 08/10/2016  . Joint pain 08/08/2016  . Prolapsed internal hemorrhoids, grade 2 06/18/2016  . Cognitive change 06/10/2016  . Family history of colon cancer 05/19/2016  . Vitamin D deficiency 03/04/2016  . Deformity of metatarsal bone of right foot 08/15/2015  . Metatarsalgia of right foot 08/15/2015  . Otalgia 04/02/2015  . Preventative health care 06/25/2014  . Sinusitis 12/09/2013  . Osteopenia 09/19/2013  . Hypothyroidism 09/08/2013  . Osteoarthritis, knee 09/06/2013  . Routine general medical examination at a health care facility 09/05/2013  . Dysphagia, unspecified(787.20) 07/19/2013  . Allergic rhinitis 11/16/2012  . Osteoarthritis of both hands 10/15/2012  . Peripheral neuropathy 12/19/2011  . 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 GERD (gastroesophageal reflux disease) 03/07/2011  . Fatty liver 12/19/2010  . Genital herpes 08/06/2010  . MAMMOGRAM, ABNORMAL 08/02/2010  . ABNORMAL ELECTROCARDIOGRAM 07/19/2010  . Obstructive sleep apnea 06/18/2010  . DM (diabetes mellitus), type 2 with neurological complications ([place]) 25/95/6387  . Hyperlipidemia 04/12/2010  . Essential hypertension 04/12/2010  . ASTHMA 04/12/2010  . DEPRESSION/ANXIETY 03/13/2010    Jeral Pinch PT 02/10/2017, 6:48 PM  [facility] [place], [place], [address] Phone: [phone]   Fax:  [phone]  Name: Crystal [name] MRN: [id number] Date of Birth:  [birth year]

## 2017-02-11 ENCOUNTER — Ambulatory Visit: Payer: Medicare HMO | Admitting: Neurology

## 2017-02-13 ENCOUNTER — Ambulatory Visit (INDEPENDENT_AMBULATORY_CARE_PROVIDER_SITE_OTHER): Payer: Medicare HMO | Admitting: Physical Therapy

## 2017-02-13 ENCOUNTER — Encounter: Payer: Self-pay | Admitting: Physical Therapy

## 2017-02-13 DIAGNOSIS — M6281 Muscle weakness (generalized): Secondary | ICD-10-CM

## 2017-02-13 DIAGNOSIS — M25561 Pain in right knee: Secondary | ICD-10-CM

## 2017-02-13 DIAGNOSIS — R2689 Other abnormalities of gait and mobility: Secondary | ICD-10-CM | POA: Diagnosis not present

## 2017-02-13 DIAGNOSIS — G8929 Other chronic pain: Secondary | ICD-10-CM

## 2017-02-13 DIAGNOSIS — M25562 Pain in left knee: Secondary | ICD-10-CM

## 2017-02-13 NOTE — Patient Instructions (Signed)
Achilles / Gastroc, Standing    Stand, right foot behind, heel on floor and turned slightly out, leg straight, forward leg bent. Move hips forward. Hold _30__ seconds. Repeat __1_ times per session. Do 1__ sessions per day.  Copyright  VHI. All rights reserved.

## 2017-02-13 NOTE — Therapy (Signed)
[place], [place], [address] Phone: [phone]   Fax:  [phone]  Physical Therapy Treatment  Patient Details  Name: Crystal [name] MRN: [id number] Date of Birth: June 12, 1951 Referring Provider: Dr Barbaraann Barthel  Encounter Date: 02/13/2017      PT End of Session - 02/13/17 1152    Visit Number 6   Number of Visits 12   Date for PT Re-Evaluation 03/04/17   PT Start Time 1150   PT Stop Time 1240   PT Time Calculation (min) 50 min   Activity Tolerance Patient tolerated treatment well      Past Medical History:  Diagnosis Date  . Acute peptic ulcer of stomach    As a teenager  . Allergy    dust, cock roach,grass,weeds,molds  . Anxiety   . Asthma    02/08/10 FEV1 1.98 (80%), s/p saba 2.22 l/m (90%).nml  . Cataract   . Cognitive change 06/10/2016  . Depression   . Diabetes mellitus 2003   Type II  . Diverticulosis   . Dyspepsia   . Fatty liver 12/19/2010  . GERD (gastroesophageal reflux disease)   . High cholesterol   . Hyperlipemia   . Hypertension   . Hypothyroidism 09/08/2013  . IBS (irritable bowel syndrome)   . Internal hemorrhoids   . Joint pain 08/08/2016  . Low back pain 08/10/2016  . OSA (obstructive sleep apnea)   . Plantar fasciitis   . Sun-damaged skin 12/09/2016  . Vitamin D deficiency 03/04/2016    Past Surgical History:  Procedure Laterality Date  . APPENDECTOMY     age 11  . CATARACT EXTRACTION, BILATERAL     12/8 and 12/14  . COLONOSCOPY  06/03/2010, 08/25/2011   diverticulosis   . GASTRECTOMY     partial - ulcer as a teen  . HAND SURGERY Right   . HEMORRHOID BANDING    . KNEE ARTHROSCOPY Left 1998  . NASAL SEPTUM SURGERY    . RHINOPLASTY    . TOE SURGERY Right 1995    There were no vitals filed for this visit.      Subjective Assessment - 02/13/17 1151    Subjective Pt reports her Lt knee is hurting bad today. She walked her dog yesterday and he  pulled on the leash and flared up the knee.  She felt good for about 2 days after last visit. Pt will be out of town some next week to visit with her sister who is terminal   Patient Stated Goals walk without pain or minimal pain, walk her dog - goes up /down stairs 3-4 time a day.    Currently in Pain? Yes   Pain Score 6    Pain Location Knee   Pain Orientation Left   Pain Descriptors / Indicators Aching   Pain Type Chronic pain   Pain Onset More than a month ago   Pain Frequency Constant   Aggravating Factors  stairs and weather            [facility] PT Assessment - 02/13/17 0001      Assessment   Medical Diagnosis Rt knee pain, Lt knee pain   Referring Provider Dr Hubbard Robinson Adult PT Treatment/Exercise - 02/13/17 0001      Knee/Hip Exercises: Stretches   Quad Stretch Both;2 reps  45 sec   Other Knee/Hip  Stretches 2x45 sec ITB cross body with strap bilat    Other Knee/Hip Stretches gastroc stetch due pt reports of foot cramping at night.      Knee/Hip Exercises: Aerobic   Nustep L4 x 6' VC for form     Knee/Hip Exercises: Supine   Straight Leg Raise with External Rotation Strengthening;Both;3 sets;10 reps     Manual Therapy   Manual Therapy Soft tissue mobilization   Soft tissue mobilization STM to Lt quad with gentle mobs to quad tendon and Lt TFL          Trigger Point Dry Needling - 02/13/17 1203    Consent Given? Yes   Education Handout Provided No   Muscles Treated Lower Body Tensor fascia lata;Quadriceps   Tensor Fascia Lata Response Palpable increased muscle length;Twitch response elicited  Lt   Quadriceps Response Palpable increased muscle length;Twitch response elicited  Lt VMO& lateralis              PT Education - 02/13/17 1217    Education provided Yes   Education Details gastroc stretch   Person(s) Educated Patient   Methods Explanation;Demonstration;Handout   Comprehension Returned  demonstration;Verbalized understanding             PT Long Term Goals - 02/10/17 1109      PT LONG TERM GOAL #1   Title I with advanced HEP ( 03/04/17)   Status On-going     PT LONG TERM GOAL #2   Title increase Lt knee ROM =/> 100 degrees to assist with stairs ( 03/04/17)    Status Achieved     PT LONG TERM GOAL #3   Title improve bilat hip and knee strength =/> 4+/5 to assist with ability to walk her dog ( 03/04/17)    Status Achieved     PT LONG TERM GOAL #4   Title report =/> 50% reduction in bilat knee pain with transitioning sit to stand ( 03/04/17)      PT LONG TERM GOAL #5   Title report =/> 50% improvement of patient ability to ambulate upon initial standing/walk ( 03/04/17)    Status Achieved  70% improvement     PT LONG TERM GOAL #6   Title improve FOTO =/< 45% limited , CK level ( 03/04/17)    Status On-going               Plan - 02/13/17 1230    Clinical Impression Statement Crystal [name] had a good response to her last treatment until her dog jerked on the leash.  She did have some trigger points in the Lt quad still and TFL which decreased after today visit.    Rehab Potential Good   PT Frequency 2x / week   PT Duration 6 weeks   PT Treatment/Interventions Moist Heat;Ultrasound;Therapeutic exercise;Dry needling;Taping;Manual techniques;Neuromuscular re-education;Vasopneumatic Device;Cryotherapy;Electrical Stimulation;Iontophoresis 4mg /ml Dexamethasone;Patient/family education   PT Next Visit Plan manual work PRN to quads/TFL. LE strengthenign   Consulted and Agree with Plan of Care Patient      Patient will benefit from skilled therapeutic intervention in order to improve the following deficits and impairments:  Decreased range of motion, Difficulty walking, Pain, Decreased strength, Decreased balance  Visit Diagnosis: Acute pain of right knee  Muscle weakness (generalized)  Other abnormalities of gait and mobility  Chronic pain of left  knee     Problem List Patient Active Problem List   Diagnosis Date Noted  . Right knee pain 12/11/2016  . 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  Sun-damaged skin 12/09/2016  . 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 Mild cognitive impairment 08/21/2016  . Mild single current episode of major depressive disorder ([place]) 08/21/2016  . Anxiety 08/21/2016  . Low back pain 08/10/2016  . Joint pain 08/08/2016  . Prolapsed internal hemorrhoids, grade 2 06/18/2016  . Cognitive change 06/10/2016  . Family history of colon cancer 05/19/2016  . Vitamin D deficiency 03/04/2016  . Deformity of metatarsal bone of right foot 08/15/2015  . Metatarsalgia of right foot 08/15/2015  . Otalgia 04/02/2015  . Preventative health care 06/25/2014  . Sinusitis 12/09/2013  . Osteopenia 09/19/2013  . Hypothyroidism 09/08/2013  . Osteoarthritis, knee 09/06/2013  . Routine general medical examination at a health care facility 09/05/2013  . Dysphagia, unspecified(787.20) 07/19/2013  . Allergic rhinitis 11/16/2012  . Osteoarthritis of both hands 10/15/2012  . Peripheral neuropathy 12/19/2011  . 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 GERD (gastroesophageal reflux disease) 03/07/2011  . Fatty liver 12/19/2010  . Genital herpes 08/06/2010  . MAMMOGRAM, ABNORMAL 08/02/2010  . ABNORMAL ELECTROCARDIOGRAM 07/19/2010  . Obstructive sleep apnea 06/18/2010  . DM (diabetes mellitus), type 2 with neurological complications ([place]) 67/61/9509  . Hyperlipidemia 04/12/2010  . Essential hypertension 04/12/2010  . ASTHMA 04/12/2010  . DEPRESSION/ANXIETY 03/13/2010    Jeral Pinch PT  02/13/2017, 12:37 PM  [facility] [place], [place], [address] Phone: [phone]   Fax:  [phone]  Name: Crystal [name] MRN: [id number] Date of Birth: 15-Jan-1951

## 2017-02-17 ENCOUNTER — Ambulatory Visit (INDEPENDENT_AMBULATORY_CARE_PROVIDER_SITE_OTHER): Payer: Medicare HMO | Admitting: Physical Therapy

## 2017-02-17 ENCOUNTER — Encounter: Payer: Self-pay | Admitting: Physical Therapy

## 2017-02-17 DIAGNOSIS — M6281 Muscle weakness (generalized): Secondary | ICD-10-CM

## 2017-02-17 DIAGNOSIS — R2689 Other abnormalities of gait and mobility: Secondary | ICD-10-CM

## 2017-02-17 DIAGNOSIS — M25562 Pain in left knee: Secondary | ICD-10-CM | POA: Diagnosis not present

## 2017-02-17 DIAGNOSIS — G8929 Other chronic pain: Secondary | ICD-10-CM

## 2017-02-17 DIAGNOSIS — M25561 Pain in right knee: Secondary | ICD-10-CM

## 2017-02-17 NOTE — Therapy (Signed)
[place] [place], [place], [address] Phone: [phone]   Fax:  [phone]  Physical Therapy Treatment  Patient Details  Name: Crystal [name] MRN: [id number] Date of Birth: Dec 07, 1950 Referring Provider: Dr Barbaraann Barthel  Encounter Date: 02/17/2017      PT End of Session - 02/17/17 1055    Visit Number 7   Number of Visits 12   Date for PT Re-Evaluation 03/04/17   PT Start Time 1059   PT Stop Time 1141   PT Time Calculation (min) 42 min      Past Medical History:  Diagnosis Date  . Acute peptic ulcer of stomach    As a teenager  . Allergy    dust, cock roach,grass,weeds,molds  . Anxiety   . Asthma    02/08/10 FEV1 1.98 (80%), s/p saba 2.22 l/m (90%).nml  . Cataract   . Cognitive change 06/10/2016  . Depression   . Diabetes mellitus 2003   Type II  . Diverticulosis   . Dyspepsia   . Fatty liver 12/19/2010  . GERD (gastroesophageal reflux disease)   . High cholesterol   . Hyperlipemia   . Hypertension   . Hypothyroidism 09/08/2013  . IBS (irritable bowel syndrome)   . Internal hemorrhoids   . Joint pain 08/08/2016  . Low back pain 08/10/2016  . OSA (obstructive sleep apnea)   . Plantar fasciitis   . Sun-damaged skin 12/09/2016  . Vitamin D deficiency 03/04/2016    Past Surgical History:  Procedure Laterality Date  . APPENDECTOMY     age 50  . CATARACT EXTRACTION, BILATERAL     12/8 and 12/14  . COLONOSCOPY  06/03/2010, 08/25/2011   diverticulosis   . GASTRECTOMY     partial - ulcer as a teen  . HAND SURGERY Right   . HEMORRHOID BANDING    . KNEE ARTHROSCOPY Left 1998  . NASAL SEPTUM SURGERY    . RHINOPLASTY    . TOE SURGERY Right 1995    There were no vitals filed for this visit.      Subjective Assessment - 02/17/17 1102    Currently in Pain? Yes   Pain Score 3    Pain Location Knee   Pain Orientation Left   Pain Descriptors / Indicators Aching   Pain Type Chronic pain    Pain Onset More than a month ago   Pain Frequency Constant   Aggravating Factors  stairs and weather   Pain Relieving Factors sunshine                         [facility] Adult PT Treatment/Exercise - 02/17/17 0001      Knee/Hip Exercises: Stretches   Active Hamstring Stretch Left;10 seconds  quad set, 10 reps   Quad Stretch Both;1 rep;60 seconds     Knee/Hip Exercises: Aerobic   Nustep L5-6'     Knee/Hip Exercises: Standing   Functional Squat 2 sets;10 reps  on high mat, attempted single leg to painful.    SLS 10 reps FWD leans on each side.    Other Standing Knee Exercises 5x10sec warrior II pose each side     Knee/Hip Exercises: Supine   Short Arc Quad Sets Strengthening;Left;10 reps  10 sec holds     Modalities   Modalities Ultrasound     Ultrasound   Ultrasound Location medial Lt knee   Ultrasound Parameters 100%, 1.7mz, 1.5w/cm2   Ultrasound Goals Pain  PT Long Term Goals - 02/17/17 1103      PT LONG TERM GOAL #1   Title I with advanced HEP ( 03/04/17)   Status On-going     PT LONG TERM GOAL #2   Title increase Lt knee ROM =/> 100 degrees to assist with stairs ( 03/04/17)    Status Achieved     PT LONG TERM GOAL #3   Title improve bilat hip and knee strength =/> 4+/5 to assist with ability to walk her dog ( 03/04/17)    Status Achieved     PT LONG TERM GOAL #4   Title report =/> 50% reduction in bilat knee pain with transitioning sit to stand ( 03/04/17)    Status On-going     PT LONG TERM GOAL #5   Title report =/> 50% improvement of patient ability to ambulate upon initial standing/walk ( 03/04/17)    Status Achieved     PT LONG TERM GOAL #6   Title improve FOTO =/< 45% limited , CK level ( 03/04/17)    Status On-going               Plan - 02/17/17 1104    Clinical Impression Statement Crystal [name] is going out of town end of this week to visit with her sister who is very sick.  She is not sure when she  will be back, depends on how she is. No new goals met. Had some pain relief with ultrasound    Rehab Potential Good   PT Frequency 2x / week   PT Duration 6 weeks   PT Treatment/Interventions Moist Heat;Ultrasound;Therapeutic exercise;Dry needling;Taping;Manual techniques;Neuromuscular re-education;Vasopneumatic Device;Cryotherapy;Electrical Stimulation;Iontophoresis 94m/ml Dexamethasone;Patient/family education   PT Next Visit Plan continue [place] LE strengthening.    Consulted and Agree with Plan of Care Patient      Patient will benefit from skilled therapeutic intervention in order to improve the following deficits and impairments:  Decreased range of motion, Difficulty walking, Pain, Decreased strength, Decreased balance  Visit Diagnosis: Acute pain of right knee  Muscle weakness (generalized)  Other abnormalities of gait and mobility  Chronic pain of left knee     Problem List Patient Active Problem List   Diagnosis Date Noted  . Right knee pain 12/11/2016  . 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 Sun-damaged skin 12/09/2016  . 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 Mild cognitive impairment 08/21/2016  . Mild single current episode of major depressive disorder [place] 08/21/2016  . Anxiety 08/21/2016  . Low back pain 08/10/2016  . Joint pain 08/08/2016  . Prolapsed internal hemorrhoids, grade 2 06/18/2016  . Cognitive change 06/10/2016  . Family history of colon cancer 05/19/2016  . Vitamin D deficiency 03/04/2016  . Deformity of metatarsal bone of right foot 08/15/2015  . Metatarsalgia of right foot 08/15/2015  . Otalgia 04/02/2015  . Preventative health care 06/25/2014  . Sinusitis 12/09/2013  . Osteopenia 09/19/2013  . Hypothyroidism 09/08/2013  . Osteoarthritis, knee 09/06/2013  . Routine general medical examination at a health care facility 09/05/2013  . Dysphagia, unspecified(787.20) 07/19/2013  . Allergic rhinitis 11/16/2012  .  Osteoarthritis of both hands 10/15/2012  . Peripheral neuropathy 12/19/2011  . 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 GERD (gastroesophageal reflux disease) 03/07/2011  . Fatty liver 12/19/2010  . Genital herpes 08/06/2010  . MAMMOGRAM, ABNORMAL 08/02/2010  . ABNORMAL ELECTROCARDIOGRAM 07/19/2010  . Obstructive sleep apnea 06/18/2010  . DM (diabetes mellitus), type 2 with neurological complications (  [place]) 04/12/2010  . Hyperlipidemia 04/12/2010  . Essential hypertension 04/12/2010  . ASTHMA 04/12/2010  . DEPRESSION/ANXIETY 03/13/2010    Jeral Pinch PT 02/17/2017, 11:46 AM  [facility] [place], [place], [address] Phone: [phone]   Fax:  [phone]  Name: Crystal [name] MRN: [id number] Date of Birth: [birth year]

## 2017-02-19 ENCOUNTER — Encounter: Payer: Self-pay | Admitting: Physical Therapy

## 2017-02-23 ENCOUNTER — Encounter: Payer: Self-pay | Admitting: Physical Therapy

## 2017-02-24 ENCOUNTER — Ambulatory Visit: Payer: Medicare HMO | Admitting: Podiatry

## 2017-02-26 ENCOUNTER — Encounter: Payer: Self-pay | Admitting: Physical Therapy

## 2017-03-02 ENCOUNTER — Telehealth: Payer: Self-pay | Admitting: Internal Medicine

## 2017-03-02 ENCOUNTER — Telehealth: Payer: Self-pay | Admitting: Family Medicine

## 2017-03-02 NOTE — Telephone Encounter (Signed)
Patient reports a few weeks of diarrhea and IBS flare as well as rectal bleeding.  She will come in and Crystal Ba PA on 03/05/17 2:00

## 2017-03-02 NOTE — Telephone Encounter (Signed)
Pt called in to request a referral to a GYN

## 2017-03-03 NOTE — Telephone Encounter (Signed)
Patient does not need referral for GYN, she can find her own and make an appointment. It will not make it any faster if we do referral for  GYN.   PC

## 2017-03-05 ENCOUNTER — Ambulatory Visit: Payer: Self-pay | Admitting: Physician Assistant

## 2017-03-05 ENCOUNTER — Ambulatory Visit (INDEPENDENT_AMBULATORY_CARE_PROVIDER_SITE_OTHER): Payer: Medicare HMO | Admitting: Physical Therapy

## 2017-03-05 DIAGNOSIS — R2689 Other abnormalities of gait and mobility: Secondary | ICD-10-CM

## 2017-03-05 DIAGNOSIS — M25561 Pain in right knee: Secondary | ICD-10-CM | POA: Diagnosis not present

## 2017-03-05 DIAGNOSIS — M6281 Muscle weakness (generalized): Secondary | ICD-10-CM

## 2017-03-05 NOTE — Therapy (Signed)
[place] [employer] [place], [place], [address] Phone: [phone]   Fax:  [phone]  Physical Therapy Treatment  Patient Details  Name: Crystal [name] MRN: [id number] Date of Birth: [birth year] Referring Provider: Dr Karlton Lemon  Encounter Date: 03/05/2017      PT End of Session - 03/05/17 1536    Visit Number 8   Number of Visits 12   Date for PT Re-Evaluation 03/04/17   PT Start Time 1532   PT Stop Time 1610   PT Time Calculation (min) 38 min   Activity Tolerance Patient tolerated treatment well   Behavior During Therapy [facility] for tasks assessed/performed      Past Medical History:  Diagnosis Date  . Acute peptic ulcer of stomach    As a teenager  . Allergy    dust, cock roach,grass,weeds,molds  . Anxiety   . Asthma    02/08/10 FEV1 1.98 (80%), s/p saba 2.22 l/m (90%).nml  . Cataract   . Cognitive change 06/10/2016  . Depression   . Diabetes mellitus 2003   Type II  . Diverticulosis   . Dyspepsia   . Fatty liver 12/19/2010  . GERD (gastroesophageal reflux disease)   . High cholesterol   . Hyperlipemia   . Hypertension   . Hypothyroidism 09/08/2013  . IBS (irritable bowel syndrome)   . Internal hemorrhoids   . Joint pain 08/08/2016  . Low back pain 08/10/2016  . OSA (obstructive sleep apnea)   . Plantar fasciitis   . Sun-damaged skin 12/09/2016  . Vitamin D deficiency 03/04/2016    Past Surgical History:  Procedure Laterality Date  . APPENDECTOMY     age 34  . CATARACT EXTRACTION, BILATERAL     12/8 and 12/14  . COLONOSCOPY  06/03/2010, 08/25/2011   diverticulosis   . GASTRECTOMY     partial - ulcer as a teen  . HAND SURGERY Right   . HEMORRHOID BANDING    . KNEE ARTHROSCOPY Left 1998  . NASAL SEPTUM SURGERY    . RHINOPLASTY    . TOE SURGERY Right 1995    There were no vitals filed for this visit.      Subjective Assessment - 03/05/17 1536    Subjective Crystal [name] has been out  of town visiting sister.  Her knee didn't hurt too much on her trip.  She would feel pain if driving too long. Overall she is doing much better; may be ready to d/c.    Patient Stated Goals walk without pain or minimal pain, walk her dog - goes up /down stairs 3-4 time a day.    Currently in Pain? Yes   Pain Score 2    Pain Location Knee   Pain Orientation Left   Pain Descriptors / Indicators Dull   Aggravating Factors  stairs and weather   Pain Relieving Factors no doing stairs.             [facility] PT Assessment - 03/05/17 0001      Assessment   Medical Diagnosis Rt knee pain, Lt knee pain   Referring Provider Dr Karlton Lemon   Onset Date/Surgical Date 09/21/16   Next MD Visit PRN     Observation/Other Assessments   Focus on Therapeutic Outcomes (FOTO)  36% limited      Flexibility   Hamstrings LLE 88 deg; RLE 88   Quadriceps Rt knee 133, Lt 131 deg          [facility] Adult  PT Treatment/Exercise - 03-12-17 0001      Self-Care   Self-Care Other Self-Care Comments   Other Self-Care Comments  Pt educated on how to cut and apply Rock tape to Lt knee.  Pt verbalized understanding and returned demo.       Knee/Hip Exercises: Stretches   Passive Hamstring Stretch Left;Right  45 sec; 3 reps on Lt, 2 reps on Rt   Quad Stretch Left;Right;3 reps  45 sec   Piriformis Stretch Left;2 reps;30 seconds   Gastroc Stretch Right;Left;2 reps  45 sec     Knee/Hip Exercises: Aerobic   Stationary Bike L3: 5 min      Kinesiotix   Edema 2- I strips of Rock tape applied with 50% stretch over Lt pes anserine to decompress tissue and decrease pain.             PT Long Term Goals - 2017/03/12 1555      PT LONG TERM GOAL #1   Title I with advanced HEP ( 03/04/17)   Period Weeks   Status Achieved     PT LONG TERM GOAL #2   Title increase Lt knee ROM =/> 100 degrees to assist with stairs ( 03/04/17)    Time 6   Period Weeks   Status Achieved     PT LONG TERM GOAL #3   Title improve  bilat hip and knee strength =/> 4+/5 to assist with ability to walk her dog ( 03/04/17)    Time 6   Period Weeks   Status Achieved     PT LONG TERM GOAL #4   Title report =/> 50% reduction in bilat knee pain with transitioning sit to stand ( 03/04/17)    Time 6   Period Weeks   Status Achieved     PT LONG TERM GOAL #5   Title report =/> 50% improvement of patient ability to ambulate upon initial standing/walk ( 03/04/17)    Time 6   Period Weeks   Status Achieved     PT LONG TERM GOAL #6   Title improve FOTO =/< 45% limited , CK level ( 03/04/17)    Period Weeks   Status Achieved  36% limited               Plan - 2017/03/12 1628    Clinical Impression Statement Pt demonstrated improved Lt knee ROM. She has met all goals and verbalized readiness to d/c at this time.    Rehab Potential Good   PT Frequency 2x / week   PT Duration 6 weeks   PT Treatment/Interventions Moist Heat;Ultrasound;Therapeutic exercise;Dry needling;Taping;Manual techniques;Neuromuscular re-education;Vasopneumatic Device;Cryotherapy;Electrical Stimulation;Iontophoresis 47m/ml Dexamethasone;Patient/family education   PT Next Visit Plan Spoke to supervising PT; will d/c at this time.    Consulted and Agree with Plan of Care Patient      Patient will benefit from skilled therapeutic intervention in order to improve the following deficits and impairments:  Decreased range of motion, Difficulty walking, Pain, Decreased strength, Decreased balance  Visit Diagnosis: Acute pain of right knee  Muscle weakness (generalized)  Other abnormalities of gait and mobility       [facility] PT PB G-CODES - [id number]    Functional Assessment Tool Used  FOTO and professional judgement   Functional Limitations Mobility: Walking and moving around   Mobility: Walking and Moving Around Goal Status [phone] At least 40 percent but less than 60 percent impaired, limited or restricted   Mobility: Walking and Moving Around  Discharge Status (  [id number]) At least 20 percent but less than 40 percent impaired, limited or restricted      Problem List Patient Active Problem List   Diagnosis Date Noted  . Right knee pain 12/11/2016  . 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 Sun-damaged skin 12/09/2016  . 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 Mild cognitive impairment 08/21/2016  . Mild single current episode of major depressive disorder ([place]) 08/21/2016  . Anxiety 08/21/2016  . Low back pain 08/10/2016  . Joint pain 08/08/2016  . Prolapsed internal hemorrhoids, grade 2 06/18/2016  . Cognitive change 06/10/2016  . Family history of colon cancer 05/19/2016  . Vitamin D deficiency 03/04/2016  . Deformity of metatarsal bone of right foot 08/15/2015  . Metatarsalgia of right foot 08/15/2015  . Otalgia 04/02/2015  . Preventative health care 06/25/2014  . Sinusitis 12/09/2013  . Osteopenia 09/19/2013  . Hypothyroidism 09/08/2013  . Osteoarthritis, knee 09/06/2013  . Routine general medical examination at a health care facility 09/05/2013  . Dysphagia, unspecified(787.20) 07/19/2013  . Allergic rhinitis 11/16/2012  . Osteoarthritis of both hands 10/15/2012  . Peripheral neuropathy 12/19/2011  . 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 GERD (gastroesophageal reflux disease) 03/07/2011  . Fatty liver 12/19/2010  . Genital herpes 08/06/2010  . MAMMOGRAM, ABNORMAL 08/02/2010  . ABNORMAL ELECTROCARDIOGRAM 07/19/2010  . Obstructive sleep apnea 06/18/2010  . DM (diabetes mellitus), type 2 with neurological complications ([place]) 17/51/0258  . Hyperlipidemia 04/12/2010  . Essential hypertension 04/12/2010  . ASTHMA 04/12/2010  . DEPRESSION/ANXIETY 03/13/2010    Kerin Perna, PTA 03/05/17 4:48 PM   [place] [place], [place], [address] Phone: [phone]   Fax:  [phone]  Name: Crystal [name] MRN: [id number] Date of Birth: 1950/09/11   PHYSICAL THERAPY DISCHARGE SUMMARY  Visits from Start of Care: 8 Current functional level related to goals / functional outcomes: See above   Remaining deficits: Known, patient did great with therapy   Education / Equipment: HEP Plan: Patient agrees to discharge.  Patient goals were met. Patient is being discharged due to meeting the stated rehab goals.  ?????    Jeral Pinch, PT 03/05/17 4:49 PM

## 2017-03-13 ENCOUNTER — Ambulatory Visit (INDEPENDENT_AMBULATORY_CARE_PROVIDER_SITE_OTHER): Payer: Medicare HMO | Admitting: Family Medicine

## 2017-03-13 ENCOUNTER — Encounter: Payer: Self-pay | Admitting: Family Medicine

## 2017-03-13 DIAGNOSIS — F419 Anxiety disorder, unspecified: Secondary | ICD-10-CM | POA: Diagnosis not present

## 2017-03-13 DIAGNOSIS — I10 Essential (primary) hypertension: Secondary | ICD-10-CM | POA: Diagnosis not present

## 2017-03-13 DIAGNOSIS — F341 Dysthymic disorder: Secondary | ICD-10-CM

## 2017-03-13 DIAGNOSIS — E1149 Type 2 diabetes mellitus with other diabetic neurological complication: Secondary | ICD-10-CM | POA: Diagnosis not present

## 2017-03-13 LAB — COMPREHENSIVE METABOLIC PANEL
ALK PHOS: 41 U/L (ref 39–117)
ALT: 23 U/L (ref 0–35)
AST: 17 U/L (ref 0–37)
Albumin: 4.2 g/dL (ref 3.5–5.2)
BUN: 12 mg/dL (ref 6–23)
CALCIUM: 9.6 mg/dL (ref 8.4–10.5)
CO2: 28 mEq/L (ref 19–32)
Chloride: 105 mEq/L (ref 96–112)
Creatinine, Ser: 0.71 mg/dL (ref 0.40–1.20)
GFR: 87.04 mL/min (ref >60.00)
GLUCOSE: 210 mg/dL — AB (ref 70–99)
POTASSIUM: 4.4 meq/L (ref 3.5–5.1)
Sodium: 139 mEq/L (ref 135–145)
Total Bilirubin: 0.4 mg/dL (ref 0.2–1.2)
Total Protein: 6.8 g/dL (ref 6.0–8.3)

## 2017-03-13 LAB — LIPID PANEL
CHOLESTEROL: 116 mg/dL (ref 0–200)
HDL: 43.7 mg/dL (ref >39.00)
LDL Cholesterol: 60 mg/dL (ref 0–99)
NonHDL: 72.35
TRIGLYCERIDES: 64 mg/dL (ref 0.0–149.0)
Total CHOL/HDL Ratio: 3
VLDL: 12.8 mg/dL (ref 0.0–40.0)

## 2017-03-13 LAB — CBC
HCT: 41 % (ref 36.0–46.0)
Hemoglobin: 13.5 g/dL (ref 12.0–15.0)
MCHC: 32.8 g/dL (ref 30.0–36.0)
MCV: 98 fl (ref 78.0–100.0)
Platelets: 272 10*3/uL (ref 150.0–400.0)
RBC: 4.19 Mil/uL (ref 3.87–5.11)
RDW: 13.6 % (ref 11.5–15.5)
WBC: 5.8 10*3/uL (ref 4.0–10.5)

## 2017-03-13 LAB — TSH: TSH: 3.27 u[IU]/mL (ref 0.35–4.50)

## 2017-03-13 LAB — HEMOGLOBIN A1C: Hgb A1c MFr Bld: 7.3 % — ABNORMAL HIGH (ref 4.6–6.5)

## 2017-03-13 MED ORDER — GLIMEPIRIDE 4 MG PO TABS: 4.0000 mg | ORAL_TABLET | Freq: Two times a day (BID) | ORAL | 1 refills | Status: DC

## 2017-03-13 MED ORDER — ESCITALOPRAM OXALATE 20 MG PO TABS: 20.0000 mg | ORAL_TABLET | Freq: Every day | ORAL | 1 refills | Status: DC

## 2017-03-13 MED ORDER — LOSARTAN POTASSIUM 50 MG PO TABS: 50.0000 mg | ORAL_TABLET | Freq: Every day | ORAL | 1 refills | Status: DC

## 2017-03-13 NOTE — Assessment & Plan Note (Signed)
Increase Losartan to 50 mg daily due to home bps approaching 180s over 90ss

## 2017-03-13 NOTE — Patient Instructions (Signed)
Until new prescriptions arrive from [facility] double up the Losartan, Glimeperide, and Escitalopram by taking 2 tabs daily til new strength arrives   Hypertension Hypertension, commonly called high blood pressure, is when the force of blood pumping through the arteries is too strong. The arteries are the blood vessels that carry blood from the heart throughout the body. Hypertension forces the heart to work harder to pump blood and may cause arteries to become narrow or stiff. Having untreated or uncontrolled hypertension can cause heart attacks, strokes, kidney disease, and other problems. A blood pressure reading consists of a higher number over a lower number. Ideally, your blood pressure should be below 120/80. The first ("top") number is called the systolic pressure. It is a measure of the pressure in your arteries as your heart beats. The second ("bottom") number is called the diastolic pressure. It is a measure of the pressure in your arteries as the heart relaxes. What are the causes? The cause of this condition is not known. What increases the risk? Some risk factors for high blood pressure are under your control. Others are not. Factors you can change  Smoking.  Having type 2 diabetes mellitus, high cholesterol, or both.  Not getting enough exercise or physical activity.  Being overweight.  Having too much fat, sugar, calories, or salt (sodium) in your diet.  Drinking too much alcohol. Factors that are difficult or impossible to change  Having chronic kidney disease.  Having a family history of high blood pressure.  Age. Risk increases with age.  Race. You may be at higher risk if you are African-American.  Gender. Men are at higher risk than women before age 87. After age 77, women are at higher risk than men.  Having obstructive sleep apnea.  Stress. What are the signs or symptoms? Extremely high blood pressure (hypertensive crisis) may  cause:  Headache.  Anxiety.  Shortness of breath.  Nosebleed.  Nausea and vomiting.  Severe chest pain.  Jerky movements you cannot control (seizures).  How is this diagnosed? This condition is diagnosed by measuring your blood pressure while you are seated, with your arm resting on a surface. The cuff of the blood pressure monitor will be placed directly against the skin of your upper arm at the level of your heart. It should be measured at least twice using the same arm. Certain conditions can cause a difference in blood pressure between your right and left arms. Certain factors can cause blood pressure readings to be lower or higher than normal (elevated) for a short period of time:  When your blood pressure is higher when you are in a health care provider's office than when you are at home, this is called white coat hypertension. Most people with this condition do not need medicines.  When your blood pressure is higher at home than when you are in a health care provider's office, this is called masked hypertension. Most people with this condition may need medicines to control blood pressure.  If you have a high blood pressure reading during one visit or you have normal blood pressure with other risk factors:  You may be asked to return on a different day to have your blood pressure checked again.  You may be asked to monitor your blood pressure at home for 1 week or longer.  If you are diagnosed with hypertension, you may have other blood or imaging tests to help your health care provider understand your overall risk for other conditions. How is  this treated? This condition is treated by making healthy lifestyle changes, such as eating healthy foods, exercising more, and reducing your alcohol intake. Your health care provider may prescribe medicine if lifestyle changes are not enough to get your blood pressure under control, and if:  Your systolic blood pressure is above  130.  Your diastolic blood pressure is above 80.  Your personal target blood pressure may vary depending on your medical conditions, your age, and other factors. Follow these instructions at home: Eating and drinking  Eat a diet that is high in fiber and potassium, and low in sodium, added sugar, and fat. An example eating plan is called the DASH (Dietary Approaches to Stop Hypertension) diet. To eat this way: ? Eat plenty of fresh fruits and vegetables. Try to fill half of your plate at each meal with fruits and vegetables. ? Eat whole grains, such as whole wheat pasta, brown rice, or whole grain bread. Fill about one quarter of your plate with whole grains. ? Eat or drink low-fat dairy products, such as skim milk or low-fat yogurt. ? Avoid fatty cuts of meat, processed or cured meats, and poultry with skin. Fill about one quarter of your plate with lean proteins, such as fish, chicken without skin, beans, eggs, and tofu. ? Avoid premade and processed foods. These tend to be higher in sodium, added sugar, and fat.  Reduce your daily sodium intake. Most people with hypertension should eat less than 1,500 mg of sodium a day.  Limit alcohol intake to no more than 1 drink a day for nonpregnant women and 2 drinks a day for men. One drink equals 12 oz of beer, 5 oz of wine, or 1 oz of hard liquor. Lifestyle  Work with your health care provider to maintain a healthy body weight or to lose weight. Ask what an ideal weight is for you.  Get at least 30 minutes of exercise that causes your heart to beat faster (aerobic exercise) most days of the week. Activities may include walking, swimming, or biking.  Include exercise to strengthen your muscles (resistance exercise), such as pilates or lifting weights, as part of your weekly exercise routine. Try to do these types of exercises for 30 minutes at least 3 days a week.  Do not use any products that contain nicotine or tobacco, such as cigarettes and  e-cigarettes. If you need help quitting, ask your health care provider.  Monitor your blood pressure at home as told by your health care provider.  Keep all follow-up visits as told by your health care provider. This is important. Medicines  Take over-the-counter and prescription medicines only as told by your health care provider. Follow directions carefully. Blood pressure medicines must be taken as prescribed.  Do not skip doses of blood pressure medicine. Doing this puts you at risk for problems and can make the medicine less effective.  Ask your health care provider about side effects or reactions to medicines that you should watch for. Contact a health care provider if:  You think you are having a reaction to a medicine you are taking.  You have headaches that keep coming back (recurring).  You feel dizzy.  You have swelling in your ankles.  You have trouble with your vision. Get help right away if:  You develop a severe headache or confusion.  You have unusual weakness or numbness.  You feel faint.  You have severe pain in your chest or abdomen.  You vomit repeatedly.  You  have trouble breathing. Summary  Hypertension is when the force of blood pumping through your arteries is too strong. If this condition is not controlled, it may put you at risk for serious complications.  Your personal target blood pressure may vary depending on your medical conditions, your age, and other factors. For most people, a normal blood pressure is less than 120/80.  Hypertension is treated with lifestyle changes, medicines, or a combination of both. Lifestyle changes include weight loss, eating a healthy, low-sodium diet, exercising more, and limiting alcohol. This information is not intended to replace advice given to you by your health care provider. Make sure you discuss any questions you have with your health care provider. Document Released: 06/30/2005 Document Revised: 05/28/2016  Document Reviewed: 05/28/2016 Elsevier Interactive Patient Education  [employer].

## 2017-03-13 NOTE — Assessment & Plan Note (Addendum)
Just got home from a road trip to visit her sister who has Alzheimer's dementia and is worsening quickly and she is struggling with worsening anxiety and anhedonia. Increase Lexapro to 20 mg daily

## 2017-03-13 NOTE — Assessment & Plan Note (Signed)
hgba1c acceptable, minimize simple carbs. Increase exercise as tolerated. Continue current meds. She is noting fasting sugars approaching 200 recently, increase Amaryl to 4 mg bid

## 2017-03-13 NOTE — Progress Notes (Signed)
Pre visit review using our clinic review tool, if applicable. No additional management support is needed unless otherwise documented below in the visit note. 

## 2017-03-16 ENCOUNTER — Encounter: Payer: Self-pay | Admitting: Family Medicine

## 2017-03-16 NOTE — Progress Notes (Signed)
Patient ID: Crystal [name], female   DOB: 11/11/1950, 66 y.o.   MRN: [id number]   Subjective:    Patient ID: Crystal [name], female    DOB: [birth year], 66 y.o.   MRN: [id number]  Chief Complaint  Patient presents with  . Follow-up    BP and Blood surgar    HPI Patient is in today for follow up. She is under a great deal of stress. She has just returned from a long road trip she took to see her sister who now has advanced alzheimer's dementia. She is clearly shaken by this experience and is tearful in visit. She acknowledges anhedonia and anxiety. No suicidal ideation. She does questin her memory as well but is managing her ADLs and chores in life well still. No recent physical illness. No fevers or hospitalizations. Denies CP/palp/SOB/HA/congestion/fevers/GI or GU c/o. Taking meds as prescribed  Past Medical History:  Diagnosis Date  . Acute peptic ulcer of stomach    As a teenager  . Allergy    dust, cock roach,grass,weeds,molds  . Anxiety   . Asthma    02/08/10 FEV1 1.98 (80%), s/p saba 2.22 l/m (90%).nml  . Cataract   . Cognitive change 06/10/2016  . Depression   . Diabetes mellitus 2003   Type II  . Diverticulosis   . Dyspepsia   . Fatty liver 12/19/2010  . GERD (gastroesophageal reflux disease)   . High cholesterol   . Hyperlipemia   . Hypertension   . Hypothyroidism 09/08/2013  . IBS (irritable bowel syndrome)   . Internal hemorrhoids   . Joint pain 08/08/2016  . Low back pain 08/10/2016  . OSA (obstructive sleep apnea)   . Plantar fasciitis   . Sun-damaged skin 12/09/2016  . Vitamin D deficiency 03/04/2016    Past Surgical History:  Procedure Laterality Date  . APPENDECTOMY     age 86  . CATARACT EXTRACTION, BILATERAL     12/8 and 12/14  . COLONOSCOPY  06/03/2010, 08/25/2011   diverticulosis   . GASTRECTOMY     partial - ulcer as a teen  . HAND SURGERY Right   . HEMORRHOID BANDING    . KNEE ARTHROSCOPY Left 1998  . NASAL SEPTUM SURGERY    .  RHINOPLASTY    . TOE SURGERY Right 1995    Family History  Problem Relation Age of Onset  . Diabetes Mother        type II  . Heart attack Mother 52  . Stroke Mother   . Colon cancer Sister   . Asthma Maternal Grandmother   . Mental retardation Son        PTSD  . Parkinsonism Father   . Cancer Sister        stage 4 bladder cancer  . Colon polyps Sister   . Ulcers Unknown        bleeding gastric  . Heart defect Unknown        child  . Other Unknown        perforated bowel, child    Social History   Social History  . Marital status: Married    Spouse name: [name]  . Number of children: 4  . Years of education: N/A   Occupational History  . retired Orthoptist   Social History Main Topics  . Smoking status: Never Smoker  . Smokeless tobacco: Never Used  . Alcohol use No  . Drug use: No  . Sexual activity: No     Comment: lives  with her mother, no dietary restrictions   Other Topics Concern  . Not on file   Social History Narrative   No caffeine drinks daily     Outpatient Medications Prior to Visit  Medication Sig Dispense Refill  . ACCU-CHEK SOFTCLIX LANCETS lancets Use as directed twice daily to check blood sugar. DX E11.9 200 each 3  . Alcohol Swabs (B-D SINGLE USE SWABS REGULAR) PADS Use as directed to check blood sugar. DX E11.9 100 each 3  . atorvastatin (LIPITOR) 80 MG tablet TAKE 1 TABLET (80 MG) BY MOUTH DAILY. 90 tablet 2  . Blood Glucose Calibration (ACCU-CHEK AVIVA) SOLN Use as directed to check blood sugar.  DX E11.9 1 each 3  . Blood Glucose Monitoring Suppl (ACCU-CHEK AVIVA PLUS) w/Device KIT Use as directed to check blood sugar.  DX E11.9 1 kit 0  . Cholecalciferol (VITAMIN D3) 2000 units TABS Take 1 tablet by mouth daily.    . diazepam (VALIUM) 5 MG tablet Take 0.5 to 1 tablet by mouth twice daily as needed for anxiety 40 tablet 2  . donepezil (ARICEPT) 5 MG tablet Take 1 tablet (5 mg total) by mouth at bedtime. 30 tablet 2  . famciclovir (FAMVIR)  500 MG tablet 1000 mg po bid x 24 hours then 250 mg po bid every day 35 tablet 3  . fenofibrate 160 MG tablet Take 1 tablet (160 mg total) by mouth daily. (Patient taking differently: Take 160 mg by mouth at bedtime. ) 90 tablet 2  . fluticasone (FLOVENT HFA) 110 MCG/ACT inhaler Inhale 1 puff into the lungs 2 (two) times daily. (Patient taking differently: Inhale 1 puff into the lungs 2 (two) times daily as needed. ) 1 Inhaler 12  . glucose blood (ACCU-CHEK AVIVA PLUS) test strip Use as directed twice daily to check blood sugar.  DX E11.9 200 each 3  . HYDROcodone-acetaminophen (NORCO/VICODIN) 5-325 MG tablet Take one by mouth at bedtime as needed for pain 10 tablet 0  . levothyroxine (SYNTHROID, LEVOTHROID) 25 MCG tablet Take 1 tablet (25 mcg total) by mouth daily before breakfast. 90 tablet 2  . Mirabegron (MYRBETRIQ PO) Take by mouth.    . Multiple Vitamins-Minerals (CENTRUM SILVER PO) Take 1 tablet by mouth daily.      . pantoprazole (PROTONIX) 40 MG tablet Take 1 tablet (40 mg total) by mouth daily. 90 tablet 1  . Probiotic Product (ALIGN) 4 MG CAPS Take 1 capsule by mouth daily. Reported on 07/02/2015    . VENTOLIN HFA 108 (90 Base) MCG/ACT inhaler INHALE 2 PUFFS BY MOUTH INTO THE LUNGS EVERY 4 (FOUR) HOURS AS NEEDED FOR SHORTNESS OF BREATH AND WHEEZING 18 g 0  . escitalopram (LEXAPRO) 10 MG tablet Take 1 tablet (10 mg total) by mouth daily. 90 tablet 2  . glimepiride (AMARYL) 2 MG tablet Take 2 mg by mouth 2 (two) times daily.    Marland Kitchen losartan (COZAAR) 25 MG tablet Take 1 tablet (25 mg total) by mouth daily. 90 tablet 2   No facility-administered medications prior to visit.     Allergies  Allergen Reactions  . Compazine [Prochlorperazine Edisylate]     Comma for 3 days  . Tetanus Toxoid Other (See Comments)    convulsions  . Sulfa Antibiotics Other (See Comments)    Unknown    Review of Systems  Constitutional: Positive for malaise/fatigue. Negative for fever.  HENT: Negative for  congestion.   Eyes: Negative for blurred vision.  Respiratory: Negative for shortness of breath.  Cardiovascular: Negative for chest pain, palpitations and leg swelling.  Gastrointestinal: Negative for abdominal pain, blood in stool and nausea.  Genitourinary: Negative for dysuria and frequency.  Musculoskeletal: Negative for falls.  Skin: Negative for rash.  Neurological: Negative for dizziness, loss of consciousness and headaches.  Endo/Heme/Allergies: Negative for environmental allergies.  Psychiatric/Behavioral: Positive for depression. The patient is nervous/anxious.        Objective:    Physical Exam  Constitutional: She is oriented to person, place, and time. She appears well-developed and well-nourished. No distress.  HENT:  Head: Normocephalic and atraumatic.  Eyes: Conjunctivae are normal.  Neck: Neck supple. No thyromegaly present.  Cardiovascular: Normal rate, regular rhythm and normal heart sounds.   No murmur heard. Pulmonary/Chest: Effort normal and breath sounds normal. No respiratory distress.  Abdominal: Soft. Bowel sounds are normal. She exhibits no distension and no mass. There is no tenderness.  Musculoskeletal: She exhibits no edema.  Lymphadenopathy:    She has no cervical adenopathy.  Neurological: She is alert and oriented to person, place, and time.  Skin: Skin is warm and dry.  Psychiatric: She has a normal mood and affect. Her behavior is normal.    BP 138/90   Pulse 88   Temp 97.8 F (36.6 C) (Oral)   Ht 5' 2.5" (1.588 m)   Wt 155 lb 3.2 oz (70.4 kg)   SpO2 96%   BMI 27.93 kg/m  Wt Readings from Last 3 Encounters:  03/13/17 155 lb 3.2 oz (70.4 kg)  12/09/16 147 lb (66.7 kg)  12/09/16 148 lb 12.8 oz (67.5 kg)     Lab Results  Component Value Date   WBC 5.8 03/13/2017   HGB 13.5 03/13/2017   HCT 41.0 03/13/2017   PLT 272.0 03/13/2017   GLUCOSE 210 (H) 03/13/2017   CHOL 116 03/13/2017   TRIG 64.0 03/13/2017   HDL 43.70 03/13/2017     LDLDIRECT 123.0 09/18/2014   LDLCALC 60 03/13/2017   ALT 23 03/13/2017   AST 17 03/13/2017   NA 139 03/13/2017   K 4.4 03/13/2017   CL 105 03/13/2017   CREATININE 0.71 03/13/2017   BUN 12 03/13/2017   CO2 28 03/13/2017   TSH 3.27 03/13/2017   HGBA1C 7.3 (H) 03/13/2017   MICROALBUR 1.3 04/30/2015    Lab Results  Component Value Date   TSH 3.27 03/13/2017   Lab Results  Component Value Date   WBC 5.8 03/13/2017   HGB 13.5 03/13/2017   HCT 41.0 03/13/2017   MCV 98.0 03/13/2017   PLT 272.0 03/13/2017   Lab Results  Component Value Date   NA 139 03/13/2017   K 4.4 03/13/2017   CO2 28 03/13/2017   GLUCOSE 210 (H) 03/13/2017   BUN 12 03/13/2017   CREATININE 0.71 03/13/2017   BILITOT 0.4 03/13/2017   ALKPHOS 41 03/13/2017   AST 17 03/13/2017   ALT 23 03/13/2017   PROT 6.8 03/13/2017   ALBUMIN 4.2 03/13/2017   CALCIUM 9.6 03/13/2017   ANIONGAP 7 01/08/2015   GFR 87.04 03/13/2017   Lab Results  Component Value Date   CHOL 116 03/13/2017   Lab Results  Component Value Date   HDL 43.70 03/13/2017   Lab Results  Component Value Date   LDLCALC 60 03/13/2017   Lab Results  Component Value Date   TRIG 64.0 03/13/2017   Lab Results  Component Value Date   CHOLHDL 3 03/13/2017   Lab Results  Component Value Date   HGBA1C 7.3 (  H) 03/13/2017       Assessment & Plan:   Problem List Items Addressed This Visit    DM (diabetes mellitus), type 2 with neurological complications ([place])    [id number] acceptable, minimize simple carbs. Increase exercise as tolerated. Continue current meds. She is noting fasting sugars approaching 200 recently, increase Amaryl to 4 mg bid      Relevant Medications   glimepiride (AMARYL) 4 MG tablet   losartan (COZAAR) 50 MG tablet   Other Relevant Orders   Hemoglobin A1c (Completed)   Lipid panel (Completed)   DEPRESSION/ANXIETY    Just got home from a road trip to visit her sister who has Alzheimer's dementia and is worsening  quickly and she is struggling with worsening anxiety and anhedonia. Increase Lexapro to 20 mg daily      Relevant Medications   escitalopram (LEXAPRO) 20 MG tablet   Essential hypertension    Increase Losartan to 50 mg daily due to home bps approaching 180s over 90ss      Relevant Medications   losartan (COZAAR) 50 MG tablet   Other Relevant Orders   CBC (Completed)   Comprehensive metabolic panel (Completed)   Lipid panel (Completed)   TSH (Completed)   RESOLVED: Anxiety   Relevant Medications   escitalopram (LEXAPRO) 20 MG tablet      I have discontinued Ms. Stordahl's losartan, glimepiride, and escitalopram. I am also having her start on glimepiride, losartan, and escitalopram. Additionally, I am having her maintain her Multiple Vitamins-Minerals (CENTRUM SILVER PO), ALIGN, diazepam, levothyroxine, fenofibrate, atorvastatin, ACCU-CHEK AVIVA PLUS, ACCU-CHEK AVIVA, B-D SINGLE USE SWABS REGULAR, glucose blood, ACCU-CHEK SOFTCLIX LANCETS, pantoprazole, VENTOLIN HFA, HYDROcodone-acetaminophen, Vitamin D3, fluticasone, Mirabegron (MYRBETRIQ PO), famciclovir, donepezil, and valACYclovir.  Meds ordered this encounter  Medications  . valACYclovir (VALTREX) 1000 MG tablet    Refill:  6  . glimepiride (AMARYL) 4 MG tablet    Sig: Take 1 tablet (4 mg total) by mouth 2 (two) times daily.    Dispense:  180 tablet    Refill:  1  . losartan (COZAAR) 50 MG tablet    Sig: Take 1 tablet (50 mg total) by mouth daily.    Dispense:  90 tablet    Refill:  1  . escitalopram (LEXAPRO) 20 MG tablet    Sig: Take 1 tablet (20 mg total) by mouth daily.    Dispense:  90 tablet    Refill:  1     Penni Homans, MD

## 2017-03-17 DIAGNOSIS — H35033 Hypertensive retinopathy, bilateral: Secondary | ICD-10-CM | POA: Diagnosis not present

## 2017-03-17 DIAGNOSIS — H43813 Vitreous degeneration, bilateral: Secondary | ICD-10-CM | POA: Diagnosis not present

## 2017-03-17 DIAGNOSIS — H524 Presbyopia: Secondary | ICD-10-CM | POA: Diagnosis not present

## 2017-03-17 DIAGNOSIS — E119 Type 2 diabetes mellitus without complications: Secondary | ICD-10-CM | POA: Diagnosis not present

## 2017-03-17 DIAGNOSIS — Z961 Presence of intraocular lens: Secondary | ICD-10-CM | POA: Diagnosis not present

## 2017-03-17 NOTE — Progress Notes (Signed)
[facility] Note  03/18/2017     CHIEF COMPLAINT Patient presents for Retina Evaluation and Diabetic Eye Exam   HISTORY OF PRESENT ILLNESS: Crystal [name] is a 66 y.o. female who presents to the clinic today for:   HPI    Retina Evaluation  In both eyes.  This started 2 weeks ago.  Duration of 2 weeks.  Associated Symptoms Floaters.  Negative for Flashes, Distortion, Redness, Trauma, Shoulder/Hip pain, Fatigue, Weight Loss, Jaw Claudication, Glare, Pain, Blind Spot, Photophobia, Scalp Tenderness and Fever.  Context:  distance vision, mid-range vision and near vision.  Treatments tried include no treatments.  I, the attending physician,  performed the HPI with the patient and updated documentation appropriately.        Diabetic Eye Exam  Vision is stable.  Associated Symptoms Floaters.  Negative for Flashes, Blind Spot, Photophobia, Scalp Tenderness, Fever, Pain, Glare, Jaw Claudication, Weight Loss, Distortion, Redness, Trauma, Shoulder/Hip pain and Fatigue.  Diabetes characteristics include Type 2.  This started 15 years ago.  Blood sugar level is controlled.  Last Blood Glucose 116.  Last A1C 7.  I, the attending physician,  performed the HPI with the patient and updated documentation appropriately.        Comments  Pt presents on the referral of Dr. Bing Plume for retinal evaluation; Pt states she is seeing floaters OU, OD>OS; Pt states that floaters manifested x 2 weeks ago; Pt denies flashes, denies wavy VA; Pt reports that she is using Zylet OU TID, denies taking eye vits;      Last edited by Bernarda Caffey, MD on 03/18/2017 10:23 AM. (History)      Referring physician: Calvert Cantor, MD [address] [place], [place] [address]  HISTORICAL INFORMATION:   Selected notes from the MEDICAL RECORD NUMBER Referral from Dr. Bing Plume for retina eval: - last exam by [facility], 03/17/17 - 2 wk history of floaters OU - hx of DM2 since 2003 - pseudophakia  OU (complex CEIOL OD, 12.14.16, OS: 11.28.16)   CURRENT MEDICATIONS: Current Outpatient Prescriptions (Ophthalmic Drugs)  Medication Sig  . prednisoLONE acetate (PRED FORTE) 1 % ophthalmic suspension Place 1 drop into the right eye 4 (four) times daily.   No current facility-administered medications for this visit.  (Ophthalmic Drugs)   Current Outpatient Prescriptions (Other)  Medication Sig  . ACCU-CHEK SOFTCLIX LANCETS lancets Use as directed twice daily to check blood sugar. DX E11.9  . Alcohol Swabs (B-D SINGLE USE SWABS REGULAR) PADS Use as directed to check blood sugar. DX E11.9  . atorvastatin (LIPITOR) 80 MG tablet TAKE 1 TABLET (80 MG) BY MOUTH DAILY.  Marland Kitchen Blood Glucose Calibration (ACCU-CHEK AVIVA) SOLN Use as directed to check blood sugar.  DX E11.9  . Blood Glucose Monitoring Suppl (ACCU-CHEK AVIVA PLUS) w/Device KIT Use as directed to check blood sugar.  DX E11.9  . Cholecalciferol (VITAMIN D3) 2000 units TABS Take 1 tablet by mouth daily.  . diazepam (VALIUM) 5 MG tablet Take 0.5 to 1 tablet by mouth twice daily as needed for anxiety (Patient not taking: Reported on 03/18/2017)  . donepezil (ARICEPT) 5 MG tablet Take 1 tablet (5 mg total) by mouth at bedtime.  Marland Kitchen escitalopram (LEXAPRO) 20 MG tablet Take 1 tablet (20 mg total) by mouth daily.  . famciclovir (FAMVIR) 500 MG tablet 1000 mg po bid x 24 hours then 250 mg po bid every day  . fenofibrate 160 MG tablet Take 1 tablet (160  mg total) by mouth daily. (Patient taking differently: Take 160 mg by mouth at bedtime. )  . fluticasone (FLOVENT HFA) 110 MCG/ACT inhaler Inhale 1 puff into the lungs 2 (two) times daily. (Patient taking differently: Inhale 1 puff into the lungs 2 (two) times daily as needed. )  . glimepiride (AMARYL) 4 MG tablet Take 1 tablet (4 mg total) by mouth 2 (two) times daily.  Marland Kitchen glucose blood (ACCU-CHEK AVIVA PLUS) test strip Use as directed twice daily to check blood sugar.  DX E11.9  .  HYDROcodone-acetaminophen (NORCO/VICODIN) 5-325 MG tablet Take one by mouth at bedtime as needed for pain (Patient not taking: Reported on 03/18/2017)  . levothyroxine (SYNTHROID, LEVOTHROID) 25 MCG tablet Take 1 tablet (25 mcg total) by mouth daily before breakfast.  . losartan (COZAAR) 50 MG tablet Take 1 tablet (50 mg total) by mouth daily.  . Mirabegron (MYRBETRIQ PO) Take by mouth.  . Multiple Vitamins-Minerals (CENTRUM SILVER PO) Take 1 tablet by mouth daily.    . pantoprazole (PROTONIX) 40 MG tablet Take 1 tablet (40 mg total) by mouth daily.  . Probiotic Product (ALIGN) 4 MG CAPS Take 1 capsule by mouth daily. Reported on 07/02/2015  . valACYclovir (VALTREX) 1000 MG tablet   . VENTOLIN HFA 108 (90 Base) MCG/ACT inhaler INHALE 2 PUFFS BY MOUTH INTO THE LUNGS EVERY 4 (FOUR) HOURS AS NEEDED FOR SHORTNESS OF BREATH AND WHEEZING   No current facility-administered medications for this visit.  (Other)      REVIEW OF SYSTEMS: ROS    Positive for: Endocrine, Eyes   Negative for: Constitutional, Gastrointestinal, Neurological, Skin, Genitourinary, Musculoskeletal, HENT, Cardiovascular, Respiratory, Psychiatric, Allergic/Imm, Heme/Lymph   Last edited by Alyse Low on 03/18/2017 10:13 AM. (History)       ALLERGIES Allergies  Allergen Reactions  . Compazine [Prochlorperazine Edisylate]     Comma for 3 days  . Tetanus Toxoid Other (See Comments)    convulsions  . Sulfa Antibiotics Other (See Comments)    Unknown    PAST MEDICAL HISTORY Past Medical History:  Diagnosis Date  . Acute peptic ulcer of stomach    As a teenager  . Allergy    dust, cock roach,grass,weeds,molds  . Anxiety   . Asthma    02/08/10 FEV1 1.98 (80%), s/p saba 2.22 l/m (90%).nml  . Cognitive change 06/10/2016  . Depression   . Diabetes mellitus 2003   Type II  . Diverticulosis   . Dyspepsia   . Fatty liver 12/19/2010  . GERD (gastroesophageal reflux disease)   . High cholesterol   . Hyperlipemia    . Hypertension   . Hypothyroidism 09/08/2013  . IBS (irritable bowel syndrome)   . Internal hemorrhoids   . Joint pain 08/08/2016  . Low back pain 08/10/2016  . OSA (obstructive sleep apnea)   . Plantar fasciitis   . Sun-damaged skin 12/09/2016  . Vitamin D deficiency 03/04/2016   Past Surgical History:  Procedure Laterality Date  . APPENDECTOMY     age 64  . CATARACT EXTRACTION Bilateral 2016   Dr. Bing Plume  . CATARACT EXTRACTION, BILATERAL     12/8 and 12/14  . COLONOSCOPY  06/03/2010, 08/25/2011   diverticulosis   . GASTRECTOMY     partial - ulcer as a teen  . HAND SURGERY Right   . HEMORRHOID BANDING    . KNEE ARTHROSCOPY Left 1998  . NASAL SEPTUM SURGERY    . RHINOPLASTY    . TOE SURGERY Right 1995  FAMILY HISTORY Family History  Problem Relation Age of Onset  . Diabetes Mother        type II  . Heart attack Mother 60  . Stroke Mother   . Colon cancer Sister   . Asthma Maternal Grandmother   . Mental retardation Son        PTSD  . Parkinsonism Father   . Cancer Sister        stage 4 bladder cancer  . Colon polyps Sister   . Ulcers Unknown        bleeding gastric  . Heart defect Unknown        child  . Other Unknown        perforated bowel, child  . Amblyopia Neg Hx   . Blindness Neg Hx   . Cataracts Neg Hx   . Glaucoma Neg Hx   . Macular degeneration Neg Hx   . Retinal detachment Neg Hx   . Strabismus Neg Hx   . Retinitis pigmentosa Neg Hx     SOCIAL HISTORY Social History  Substance Use Topics  . Smoking status: Never Smoker  . Smokeless tobacco: Never Used  . Alcohol use No         OPHTHALMIC EXAM:  Base Eye Exam    Visual Acuity (Snellen - Linear)      Right Left   Dist [place] 20/30 20/25   Dist ph [place] 20/25 20/20       Tonometry (Applanation, 10:25 AM)      Right Left   Pressure 20 19       Pupils      Dark Light Shape React APD   Right 5 4 Irregular Brisk None   Left 4 3 Round Brisk None       Visual Fields (Counting  fingers)      Left Right    Full Full       Extraocular Movement      Right Left    Full, Ortho Full, Ortho       Neuro/Psych    Oriented x3:  Yes   Mood/Affect:  Normal       Dilation    Both eyes:  1.0% Mydriacyl, 2.5% Phenylephrine @ 10:24 AM        Slit Lamp and Fundus Exam    External Exam      Right Left   External Normal Normal       Slit Lamp Exam      Right Left   Lids/Lashes Dermatochalasis - upper lid temporally  Dermatochalasis - upper lid temporally    Conjunctiva/Sclera White and quiet White and quiet   Cornea Nylon mattress suture at 0900; cataract wound closed at 0900 Clear   Anterior Chamber Deep and quiet Deep and quiet   Iris Round and dilated; 6.75 mm  Round and dilated; 6.75 mm; PPM at 0600    Lens Three piece Posterior chamber intraocular lens ?sulcus Posterior chamber intraocular lens - good position   Vitreous Vitreous syneresis Vitreous syneresis; Posterior vitreous detachment       Fundus Exam      Right Left   Disc Normal rim; cupped Normal rim, cupped   C/D Ratio 0.7 0.65   Macula ERM; blunted foveal reflex; mild edema Normal foveal reflex   Vessels Vascular attenuation - mild Vascular attenuation - mild   Periphery flat; attached; trace RPE changes; hair line Retinal tears at 3 and 4 o'clock -- no SRF; Degeneration - peripheral cystoid  Flat; attached; trace RPE changes; , Degeneration - peripheral cystoid        Refraction    Manifest Refraction      Sphere Cylinder Dist VA   Right [place]  20/25   Left -0.50 Sphere 20/20          IMAGING AND PROCEDURES  Imaging and Procedures for 03/18/17  OCT, Retina - OU - Both Eyes     Right Eye Quality was good. Central Foveal Thickness: 398. Findings include abnormal foveal contour, epiretinal membrane, intraretinal fluid.   Left Eye Quality was good. Central Foveal Thickness: 283. Findings include normal foveal contour, no IRF, no SRF.   Notes Images taken, stored on drive    Diagnosis / Impression:  OD - abnormal foveal profile; trace ERM; mild CME OS - NFP; no IRF/SRF  Clinical management:  See below  Abbreviations: NFP - Normal foveal profile. CME - cystoid macular edema. PED - pigment epithelial detachment. IRF - intraretinal fluid. SRF - subretinal fluid. EZ - ellipsoid zone. ERM - epiretinal membrane. ORA - outer retinal atrophy. ORT - outer retinal tubulation. SRHM - subretinal hyper-reflective material       Repair Retinal Breaks, Laser - OD - Right Eye     LASER PROCEDURE NOTE  Procedure:  Barrier laser retinopexy using laser indirect ophthalmoscope, RIGHT eye   Diagnosis:   Retinal tears, RIGHT eye                     Hairline flap tears at 3 and 4 oclock, ora serrata   Surgeon: Bernarda Caffey, MD, PhD  Anesthesia: Topical (0.5% tetracaine)  Informed consent obtained, operative eye marked, and time out performed prior to initiation of laser.   Laser settings:  Lumenis [id number] laser indirect ophthalmoscope Power: 200-250 mW Duration: 100 msec  # spots: 393  Placement of laser: Laser was placed in three confluent rows posterior to and around small tears at 3 and 4 oclock ora w/ additional rows anteriorly.  Complications: None.               ASSESSMENT/PLAN:    ICD-10-CM   1. 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 Posterior vitreous detachment of both eyes H43.813 OCT, Retina - OU - Both Eyes  3. Type 2 diabetes mellitus without complication, without long-term current use of insulin (HCC) E11.9   4. Pseudophakia of both eyes Z96.1   5. Cystoid macular edema of right eye H35.351 OCT, Retina - OU - Both Eyes  6. Epiretinal membrane (ERM) of right eye H35.371     1. 2 peripheral retinal tears OD  Small flap tears at ora, 0300 and 0400 oclock  No SRF or associated RD  Recommend barrier laser retinopexy OD today, 03/18/17  RBA of laser procedure  discussed  Pt wishes to proceed with laser -- informed consent obtained and signed  See procedure note  No complications  Start PF QID OD  F/u 2 wks  2. PVD / vitreous syneresis OU  Discussed findings and prognosis  No RT or RD on 360 scleral depressed exam OS  Reviewed s/s of RT/RD  Strict return precautions for any such RT/RD signs/symptoms  3. DM2 without retinopathy  On oral meds  Discussed importance of tight BG and BP control  4. Pseudophakia OU  IOLs in good position OU -- beautiful surgeries by Dr. Bing Plume  OD with history of anterior vitrectomy and 3  pieces sulcus IOL  Currently taking Zylet TID OD following corneal suture removal -- cont per Dr. Bing Plume  Monitor  5. CME OD  Very mild with VA still 20/25  Etiology could be multifactorial -- ERM, delayed post-cataract  Will trial PF QID OD -- started for laser retinopexy, but will continue for CME  Consider subconj steroid if fails to improve or resolve with topical PF  6. Epiretinal membrane, right eye   The natural history, anatomy, potential for loss of vision, and treatment options including vitrectomy techniques and the complications of endophthalmitis, retinal detachment, vitreous hemorrhage and permanent vision loss discussed with the patient.  Very mild ERM OD with VA still 86/75  May be complicating CME OD (#5)  Not yet surgical -- monitor for pucker / symptoms     Ophthalmic Meds Ordered this visit:  Meds ordered this encounter  Medications  . prednisoLONE acetate (PRED FORTE) 1 % ophthalmic suspension    Sig: Place 1 drop into the right eye 4 (four) times daily.    Dispense:  15 mL    Refill:  0       Return in about 2 weeks (around 04/01/2017) for POV, Dilated Exam, OCT.  There are no Patient Instructions on file for this visit.   Explained the diagnoses, plan, and follow up with the patient and they expressed understanding.  Patient expressed understanding of the importance of proper follow up  care.   Gardiner Sleeper, M.D., Ph.D. Vitreoretinal Surgeon [facility] 03/18/17     Abbreviations: M myopia (nearsighted); A astigmatism; H hyperopia (farsighted); P presbyopia; Mrx spectacle prescription;  CTL contact lenses; OD right eye; OS left eye; OU both eyes  XT exotropia; ET esotropia; PEK punctate epithelial keratitis; PEE punctate epithelial erosions; DES dry eye syndrome; MGD meibomian gland dysfunction; ATs artificial tears; PFAT's preservative free artificial tears; [place] nuclear sclerotic cataract; PSC posterior subcapsular cataract; ERM epi-retinal membrane; PVD posterior vitreous detachment; RD retinal detachment; DM diabetes mellitus; DR diabetic retinopathy; NPDR non-proliferative diabetic retinopathy; PDR proliferative diabetic retinopathy; CSME clinically significant macular edema; DME diabetic macular edema; dbh dot blot hemorrhages; CWS cotton wool spot; POAG primary open angle glaucoma; C/D cup-to-disc ratio; HVF humphrey visual field; GVF goldmann visual field; OCT optical coherence tomography; IOP intraocular pressure; BRVO Branch retinal vein occlusion; CRVO central retinal vein occlusion; CRAO central retinal artery occlusion; BRAO branch retinal artery occlusion; RT retinal tear; SB scleral buckle; PPV pars plana vitrectomy; VH Vitreous hemorrhage; PRP panretinal laser photocoagulation; IVK intravitreal kenalog; VMT vitreomacular traction; MH Macular hole;  NVD neovascularization of the disc; NVE neovascularization elsewhere; AREDS age related eye disease study; ARMD age related macular degeneration; POAG primary open angle glaucoma; EBMD epithelial/anterior basement membrane dystrophy; ACIOL anterior chamber intraocular lens; IOL intraocular lens; PCIOL posterior chamber intraocular lens; Phaco/IOL phacoemulsification with intraocular lens placement; [place] photorefractive keratectomy; LASIK laser assisted in situ keratomileusis; HTN hypertension; DM  diabetes mellitus; COPD chronic obstructive pulmonary disease

## 2017-03-18 ENCOUNTER — Encounter (INDEPENDENT_AMBULATORY_CARE_PROVIDER_SITE_OTHER): Payer: Self-pay | Admitting: Ophthalmology

## 2017-03-18 ENCOUNTER — Ambulatory Visit (INDEPENDENT_AMBULATORY_CARE_PROVIDER_SITE_OTHER): Payer: Medicare HMO | Admitting: Ophthalmology

## 2017-03-18 DIAGNOSIS — E119 Type 2 diabetes mellitus without complications: Secondary | ICD-10-CM

## 2017-03-18 DIAGNOSIS — H35371 Puckering of macula, right eye: Secondary | ICD-10-CM

## 2017-03-18 DIAGNOSIS — Z961 Presence of intraocular lens: Secondary | ICD-10-CM

## 2017-03-18 DIAGNOSIS — H35351 Cystoid macular degeneration, right eye: Secondary | ICD-10-CM | POA: Diagnosis not present

## 2017-03-18 DIAGNOSIS — H33331 Multiple defects of retina without detachment, right eye: Secondary | ICD-10-CM | POA: Diagnosis not present

## 2017-03-18 DIAGNOSIS — H43813 Vitreous degeneration, bilateral: Secondary | ICD-10-CM | POA: Diagnosis not present

## 2017-03-18 MED ORDER — PREDNISOLONE ACETATE 1 % OP SUSP: 1.0000 [drp] | Freq: Four times a day (QID) | OPHTHALMIC | 0 refills | Status: DC

## 2017-03-18 MED FILL — PREDNISOLONE AC 1% EYE DROP: 1 | 25 days supply | Qty: 5 | Fill #0

## 2017-03-23 ENCOUNTER — Encounter: Payer: Self-pay | Admitting: Family Medicine

## 2017-03-23 ENCOUNTER — Telehealth: Payer: Self-pay | Admitting: Family Medicine

## 2017-03-23 ENCOUNTER — Encounter: Payer: Self-pay | Admitting: Internal Medicine

## 2017-03-23 NOTE — Telephone Encounter (Signed)
[facility] - Client TELEPHONE ADVICE RECORD [facility]  Patient Name: Crystal [name]  DOB: 19-May-1951    Initial Comment Caller states her blood sugar after breakfast is 259.   Nurse Assessment      Guidelines    Guideline Title Affirmed Question Affirmed Notes       Final Disposition User   FINAL ATTEMPT MADE - no message left Harlow Mares, Therapist, sports, Suanne Marker

## 2017-03-23 NOTE — Telephone Encounter (Signed)
Pt wants to know if she continue taking the same medicines since her blood sugars was 259 two hours after breakfast and she had taken the medication before breakfast. Call [phone]. cb

## 2017-03-23 NOTE — Telephone Encounter (Signed)
Would like her to add Januvia 100 mg tabs 1 tab daily and I am happy to refer her to endocrinology for further consultation if she would like.

## 2017-03-23 NOTE — Telephone Encounter (Signed)
Please advise    PC 

## 2017-03-24 ENCOUNTER — Encounter: Payer: Self-pay | Admitting: Family Medicine

## 2017-03-24 MED ORDER — SITAGLIPTIN PHOSPHATE 100 MG PO TABS: 100.0000 mg | ORAL_TABLET | Freq: Every day | ORAL | 0 refills | Status: DC

## 2017-03-24 NOTE — Addendum Note (Signed)
Addended by: Bunnie Domino on: 03/24/2017 08:41 AM   Modules accepted: Orders

## 2017-03-24 NOTE — Telephone Encounter (Signed)
Called patient left detailed message on answering machine. Advised to call back if she had any questions. Januvia 100 mg sent to pharmacy per order fron Dr. Charlett Blake.

## 2017-03-25 ENCOUNTER — Other Ambulatory Visit: Payer: Self-pay | Admitting: Family Medicine

## 2017-03-25 ENCOUNTER — Encounter (INDEPENDENT_AMBULATORY_CARE_PROVIDER_SITE_OTHER): Payer: Self-pay | Admitting: Ophthalmology

## 2017-03-25 DIAGNOSIS — E118 Type 2 diabetes mellitus with unspecified complications: Secondary | ICD-10-CM

## 2017-03-30 ENCOUNTER — Encounter (INDEPENDENT_AMBULATORY_CARE_PROVIDER_SITE_OTHER): Payer: Self-pay

## 2017-03-30 ENCOUNTER — Encounter (INDEPENDENT_AMBULATORY_CARE_PROVIDER_SITE_OTHER): Payer: Self-pay | Admitting: Ophthalmology

## 2017-03-30 ENCOUNTER — Ambulatory Visit: Payer: Medicare HMO | Admitting: Psychology

## 2017-03-30 ENCOUNTER — Other Ambulatory Visit: Payer: Self-pay

## 2017-03-30 MED ORDER — ACCU-CHEK SOFTCLIX LANCETS MISC: 3 refills | Status: DC

## 2017-03-30 MED FILL — ACCU-CHEK SOFTCLIX LANCETS: 50 days supply | Qty: 100 | Fill #0

## 2017-03-30 NOTE — Progress Notes (Signed)
[facility] Note  04/01/2017     CHIEF COMPLAINT Patient presents for Post-op Follow-up   HISTORY OF PRESENT ILLNESS: Crystal [name] is a 66 y.o. female who presents to the clinic today for:   HPI    Post-op Follow-up  In right eye.  Discomfort includes floaters.  Negative for pain, tearing, itching, discharge and foreign body sensation.  Vision is stable.        Comments  2 week S/P laser retinopexy OD (03/18/17); Pt states she continues to have floaters OD off and on; Pt denies flashes; Pt states that OD is comfortable;      Last edited by Alyse Low on 04/01/2017  9:10 AM. (History)      Referring physician: [name], MD [address] [place], [place] [address]  HISTORICAL INFORMATION:   Selected notes from the MEDICAL RECORD NUMBER Referral from Dr. Bing Plume for retina eval: - last exam by [facility], 03/17/17 - 2 wk history of floaters OU - hx of DM2 since 2003 - pseudophakia OU (complex CEIOL OD, 12.14.16, OS: 11.28.16)   CURRENT MEDICATIONS: Current Outpatient Prescriptions (Ophthalmic Drugs)  Medication Sig  . prednisoLONE acetate (PRED FORTE) 1 % ophthalmic suspension Place 1 drop into the right eye 4 (four) times daily.   No current facility-administered medications for this visit.  (Ophthalmic Drugs)   Current Outpatient Prescriptions (Other)  Medication Sig  . ACCU-CHEK SOFTCLIX LANCETS lancets Use as directed twice daily to check blood sugar. DX E11.9  . Alcohol Swabs (B-D SINGLE USE SWABS REGULAR) PADS Use as directed to check blood sugar. DX E11.9  . atorvastatin (LIPITOR) 80 MG tablet TAKE 1 TABLET (80 MG) BY MOUTH DAILY.  Marland Kitchen Blood Glucose Calibration (ACCU-CHEK AVIVA) SOLN Use as directed to check blood sugar.  DX E11.9  . Blood Glucose Monitoring Suppl (ACCU-CHEK AVIVA PLUS) w/Device KIT Use as directed to check blood sugar.  DX E11.9  . Cholecalciferol (VITAMIN D3) 2000 units TABS Take 1 tablet by  mouth daily.  . diazepam (VALIUM) 5 MG tablet Take 0.5 to 1 tablet by mouth twice daily as needed for anxiety (Patient not taking: Reported on 03/18/2017)  . donepezil (ARICEPT) 5 MG tablet Take 1 tablet (5 mg total) by mouth at bedtime.  Marland Kitchen escitalopram (LEXAPRO) 20 MG tablet Take 1 tablet (20 mg total) by mouth daily.  . famciclovir (FAMVIR) 500 MG tablet 1000 mg po bid x 24 hours then 250 mg po bid every day  . fenofibrate 160 MG tablet Take 1 tablet (160 mg total) by mouth daily. (Patient taking differently: Take 160 mg by mouth at bedtime. )  . fluticasone (FLOVENT HFA) 110 MCG/ACT inhaler Inhale 1 puff into the lungs 2 (two) times daily. (Patient taking differently: Inhale 1 puff into the lungs 2 (two) times daily as needed. )  . glimepiride (AMARYL) 4 MG tablet Take 1 tablet (4 mg total) by mouth 2 (two) times daily.  Marland Kitchen glucose blood (ACCU-CHEK AVIVA PLUS) test strip Use as directed twice daily to check blood sugar.  DX E11.9  . HYDROcodone-acetaminophen (NORCO/VICODIN) 5-325 MG tablet Take one by mouth at bedtime as needed for pain (Patient not taking: Reported on 03/18/2017)  . levothyroxine (SYNTHROID, LEVOTHROID) 25 MCG tablet Take 1 tablet (25 mcg total) by mouth daily before breakfast.  . losartan (COZAAR) 50 MG tablet Take 1 tablet (50 mg total) by mouth daily.  . Mirabegron (MYRBETRIQ PO) Take by mouth.  Marland Kitchen  Multiple Vitamins-Minerals (CENTRUM SILVER PO) Take 1 tablet by mouth daily.    . pantoprazole (PROTONIX) 40 MG tablet Take 1 tablet (40 mg total) by mouth daily.  . Probiotic Product (ALIGN) 4 MG CAPS Take 1 capsule by mouth daily. Reported on 07/02/2015  . sitaGLIPtin (JANUVIA) 100 MG tablet Take 1 tablet (100 mg total) by mouth daily.  . valACYclovir (VALTREX) 1000 MG tablet   . VENTOLIN HFA 108 (90 Base) MCG/ACT inhaler INHALE 2 PUFFS BY MOUTH INTO THE LUNGS EVERY 4 (FOUR) HOURS AS NEEDED FOR SHORTNESS OF BREATH AND WHEEZING   No current facility-administered medications for this  visit.  (Other)      REVIEW OF SYSTEMS: ROS    Positive for: Eyes   Negative for: Constitutional, Gastrointestinal, Neurological, Skin, Genitourinary, Musculoskeletal, HENT, Endocrine, Cardiovascular, Respiratory, Psychiatric, Allergic/Imm, Heme/Lymph   Last edited by Alyse Low on 04/01/2017  9:10 AM. (History)       ALLERGIES Allergies  Allergen Reactions  . Compazine [Prochlorperazine Edisylate]     Comma for 3 days  . Tetanus Toxoid Other (See Comments)    convulsions  . Sulfa Antibiotics Other (See Comments)    Unknown    PAST MEDICAL HISTORY Past Medical History:  Diagnosis Date  . Acute peptic ulcer of stomach    As a teenager  . Allergy    dust, cock roach,grass,weeds,molds  . Anxiety   . Asthma    02/08/10 FEV1 1.98 (80%), s/p saba 2.22 l/m (90%).nml  . Cognitive change 06/10/2016  . Depression   . Diabetes mellitus 2003   Type II  . Diverticulosis   . Dyspepsia   . Fatty liver 12/19/2010  . GERD (gastroesophageal reflux disease)   . High cholesterol   . Hyperlipemia   . Hypertension   . Hypothyroidism 09/08/2013  . IBS (irritable bowel syndrome)   . Internal hemorrhoids   . Joint pain 08/08/2016  . Low back pain 08/10/2016  . OSA (obstructive sleep apnea)   . Plantar fasciitis   . Sun-damaged skin 12/09/2016  . Vitamin D deficiency 03/04/2016   Past Surgical History:  Procedure Laterality Date  . APPENDECTOMY     age 7  . CATARACT EXTRACTION Bilateral 2016   Dr. Bing Plume  . CATARACT EXTRACTION, BILATERAL     12/8 and 12/14  . COLONOSCOPY  06/03/2010, 08/25/2011   diverticulosis   . GASTRECTOMY     partial - ulcer as a teen  . HAND SURGERY Right   . HEMORRHOID BANDING    . KNEE ARTHROSCOPY Left 1998  . NASAL SEPTUM SURGERY    . RHINOPLASTY    . TOE SURGERY Right 1995    FAMILY HISTORY Family History  Problem Relation Age of Onset  . Diabetes Mother        type II  . Heart attack Mother 36  . Stroke Mother   . Colon cancer Sister    . Asthma Maternal Grandmother   . Mental retardation Son        PTSD  . Parkinsonism Father   . Cancer Sister        stage 4 bladder cancer  . Colon polyps Sister   . Ulcers Unknown        bleeding gastric  . Heart defect Unknown        child  . Other Unknown        perforated bowel, child  . Amblyopia Neg Hx   . Blindness Neg Hx   . Cataracts Neg  Hx   . Glaucoma Neg Hx   . Macular degeneration Neg Hx   . Retinal detachment Neg Hx   . Strabismus Neg Hx   . Retinitis pigmentosa Neg Hx     SOCIAL HISTORY Social History  Substance Use Topics  . Smoking status: Never Smoker  . Smokeless tobacco: Never Used  . Alcohol use No         OPHTHALMIC EXAM:  Base Eye Exam    Visual Acuity (Snellen - Linear)      Right Left   Dist [place] 20/70 +1 20/40   Dist ph [place] 20/40 +2 20/25 +2       Tonometry (Applanation, 9:18 AM)      Right Left   Pressure 20 20       Pupils      Dark Light Shape React APD   Right 5 4 Irregular Slow None   Left 5 4 Irregular Slow None       Visual Fields      Left Right     Full   Restrictions Partial outer inferior temporal deficiency    Slight constriction; small than documented;        Extraocular Movement      Right Left    Full, Ortho Full, Ortho       Neuro/Psych    Oriented x3:  Yes   Mood/Affect:  Normal       Dilation    Both eyes:  1.0% Mydriacyl, 2.5% Phenylephrine @ 9:18 AM        Slit Lamp and Fundus Exam    External Exam      Right Left   External Normal Normal       Slit Lamp Exam      Right Left   Lids/Lashes Dermatochalasis - upper lid temporally  Dermatochalasis - upper lid temporally    Conjunctiva/Sclera White and quiet White and quiet   Cornea Nylon mattress suture at 0900; cataract wound closed at 0900 Clear   Anterior Chamber Deep and quiet Deep and quiet   Iris Round and dilated; 6.75 mm  Round and dilated; 6.75 mm; PPM at 0600    Lens Three piece Posterior chamber intraocular lens ?sulcus  Posterior chamber intraocular lens - good position   Vitreous Vitreous syneresis Vitreous syneresis; Posterior vitreous detachment       Fundus Exam      Right Left   Disc Normal rim; cupped Normal rim, cupped   C/D Ratio 0.7 0.65   Macula ERM; blunted foveal reflex; mild edema Normal foveal reflex   Vessels Vascular attenuation - mild Vascular attenuation - mild   Periphery flat; attached; trace RPE changes; hair line Retinal tears at 3 and 4 o'clock / good laser surrounding -- no SRF; Degeneration - peripheral cystoid; supratemporal cobblestoning  Flat; attached; trace RPE changes; , Degeneration - peripheral cystoid          IMAGING AND PROCEDURES  Imaging and Procedures for 04/01/17           ASSESSMENT/PLAN:    ICD-10-CM   1. 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 Posterior vitreous detachment of both eyes H43.813   3. Type 2 diabetes mellitus without complication, without long-term current use of insulin (HCC) E11.9   4. Pseudophakia of both eyes Z96.1   5. Cystoid macular edema of right eye H35.351   6. Epiretinal membrane (ERM) of right eye H35.371     1. 2 peripheral retinal  tears OD  Small flap tears at ora, 0300 and 0400 oclock  No SRF or associated RD  s/p barrier laser retinopexy OD, 03/18/17  Good laser in place  Cont PF QID OD  F/u 4 wks  2. PVD / vitreous syneresis OU  Discussed findings and prognosis  No RT or RD on 360 scleral depressed exam OS  Reviewed s/s of RT/RD  Strict return precautions for any such RT/RD signs/symptoms  3. DM2 without retinopathy  On oral meds  Discussed importance of tight BG and BP control  4. Pseudophakia OU  IOLs in good position OU -- beautiful surgeries by Dr. Bing Plume  OD with history of anterior vitrectomy and 3 pieces sulcus IOL  Currently taking Zylet TID OD following corneal suture removal -- cont per Dr. Bing Plume  Monitor  5. CME OD  Etiology could be multifactorial -- ERM, delayed  post-cataract  Cont PF QID OD -- started for laser retinopexy, but will continue for CME  Consider subconj steroid if fails to improve or resolve with topical PF  6. Epiretinal membrane, right eye   The natural history, anatomy, potential for loss of vision, and treatment options including vitrectomy techniques and the complications of endophthalmitis, retinal detachment, vitreous hemorrhage and permanent vision loss discussed with the patient  Mild ERM OD  May be complicating CME OD (#5)  Not yet surgical -- monitor for pucker / symptoms     Ophthalmic Meds Ordered this visit:  No orders of the defined types were placed in this encounter.      Return for POV.  There are no Patient Instructions on file for this visit.   Explained the diagnoses, plan, and follow up with the patient and they expressed understanding.  Patient expressed understanding of the importance of proper follow up care.   Gardiner Sleeper, M.D., Ph.D. Vitreoretinal Surgeon [facility] 04/01/17     Abbreviations: M myopia (nearsighted); A astigmatism; H hyperopia (farsighted); P presbyopia; Mrx spectacle prescription;  CTL contact lenses; OD right eye; OS left eye; OU both eyes  XT exotropia; ET esotropia; PEK punctate epithelial keratitis; PEE punctate epithelial erosions; DES dry eye syndrome; MGD meibomian gland dysfunction; ATs artificial tears; PFAT's preservative free artificial tears; [place] nuclear sclerotic cataract; PSC posterior subcapsular cataract; ERM epi-retinal membrane; PVD posterior vitreous detachment; RD retinal detachment; DM diabetes mellitus; DR diabetic retinopathy; NPDR non-proliferative diabetic retinopathy; PDR proliferative diabetic retinopathy; CSME clinically significant macular edema; DME diabetic macular edema; dbh dot blot hemorrhages; CWS cotton wool spot; POAG primary open angle glaucoma; C/D cup-to-disc ratio; HVF humphrey visual field; GVF goldmann visual  field; OCT optical coherence tomography; IOP intraocular pressure; BRVO Branch retinal vein occlusion; CRVO central retinal vein occlusion; CRAO central retinal artery occlusion; BRAO branch retinal artery occlusion; RT retinal tear; SB scleral buckle; PPV pars plana vitrectomy; VH Vitreous hemorrhage; PRP panretinal laser photocoagulation; IVK intravitreal kenalog; VMT vitreomacular traction; MH Macular hole;  NVD neovascularization of the disc; NVE neovascularization elsewhere; AREDS age related eye disease study; ARMD age related macular degeneration; POAG primary open angle glaucoma; EBMD epithelial/anterior basement membrane dystrophy; ACIOL anterior chamber intraocular lens; IOL intraocular lens; PCIOL posterior chamber intraocular lens; Phaco/IOL phacoemulsification with intraocular lens placement; Hernandez photorefractive keratectomy; LASIK laser assisted in situ keratomileusis; HTN hypertension; DM diabetes mellitus; COPD chronic obstructive pulmonary disease

## 2017-04-01 ENCOUNTER — Ambulatory Visit (INDEPENDENT_AMBULATORY_CARE_PROVIDER_SITE_OTHER): Payer: Medicare HMO | Admitting: Ophthalmology

## 2017-04-01 ENCOUNTER — Encounter (INDEPENDENT_AMBULATORY_CARE_PROVIDER_SITE_OTHER): Payer: Self-pay | Admitting: Ophthalmology

## 2017-04-01 DIAGNOSIS — Z961 Presence of intraocular lens: Secondary | ICD-10-CM

## 2017-04-01 DIAGNOSIS — H43813 Vitreous degeneration, bilateral: Secondary | ICD-10-CM

## 2017-04-01 DIAGNOSIS — H35371 Puckering of macula, right eye: Secondary | ICD-10-CM

## 2017-04-01 DIAGNOSIS — H33331 Multiple defects of retina without detachment, right eye: Secondary | ICD-10-CM

## 2017-04-01 DIAGNOSIS — H35351 Cystoid macular degeneration, right eye: Secondary | ICD-10-CM

## 2017-04-01 DIAGNOSIS — E119 Type 2 diabetes mellitus without complications: Secondary | ICD-10-CM

## 2017-04-02 ENCOUNTER — Ambulatory Visit (INDEPENDENT_AMBULATORY_CARE_PROVIDER_SITE_OTHER): Payer: Medicare HMO | Admitting: Internal Medicine

## 2017-04-02 ENCOUNTER — Other Ambulatory Visit: Payer: Self-pay | Admitting: Family Medicine

## 2017-04-02 ENCOUNTER — Encounter: Payer: Self-pay | Admitting: Internal Medicine

## 2017-04-02 VITALS — BP 140/78 | HR 80 | Ht 63.0 in | Wt 156.0 lb

## 2017-04-02 DIAGNOSIS — E1149 Type 2 diabetes mellitus with other diabetic neurological complication: Secondary | ICD-10-CM | POA: Diagnosis not present

## 2017-04-02 DIAGNOSIS — Z23 Encounter for immunization: Secondary | ICD-10-CM | POA: Diagnosis not present

## 2017-04-02 MED ORDER — EMPAGLIFLOZIN 10 MG PO TABS: 10.0000 mg | ORAL_TABLET | Freq: Every day | ORAL | 5 refills | Status: DC

## 2017-04-02 MED FILL — JARDIANCE 10 MG TABLET: 10 | 30 days supply | Qty: 30 | Fill #0

## 2017-04-02 NOTE — Progress Notes (Addendum)
Patient ID: Crystal [name], female   DOB: 1950/12/10, 66 y.o.   MRN: [id number]   HPI: Crystal [name] is a 66 y.o.-year-old female, referred by her PCP, Dr. Charlett Blake, for management of DM2, dx in 2003, non-insulin-dependent, uncontrolled, with complications (PN, DR).  Last hemoglobin A1c was: Lab Results  Component Value Date   HGBA1C 7.3 (H) 03/13/2017   HGBA1C 7.0 (H) 11/28/2016   HGBA1C 6.9 (H) 10/06/2016   Pt is on a regimen of: - Amaryl 4 mg 2x a day before meals. Metformin gave her diarrhea in the past. Januvia was too expensive. Invokana >> cannot remember why stopped  Pt checks her sugars 0-1x a day and they are: - am: 120-125 - 2h after b'fast: 199 - before lunch: n/c - 2h after lunch: 190-240s, 275  - beforere dinner: n/c - 2h after dinner: n/c - bedtime: 115-140 - nighttime: n/c Lowest sugar was 55 (? Reason); she has hypoglycemia awareness at 80.  Highest sugar was 320.  Glucometer: AccuChek Aviva  Pt's meals are: - Breakfast: bowl of cereals or bacon + egg + toast or egg sandwich or biscuit + gravy + hash browns; diet drinks - Lunch: leftovers from dinner or tuna + salad dressing + tuna; box of mac and cheese + tuna; raman noodles; cheese + fruit - Dinner: may eat out (chinese)   - no CKD, last BUN/creatinine:  Lab Results  Component Value Date   BUN 12 03/13/2017   BUN 17 11/28/2016   CREATININE 0.71 03/13/2017   CREATININE 0.77 11/28/2016  On Losartan 50. - last set of lipids: Lab Results  Component Value Date   CHOL 116 03/13/2017   HDL 43.70 03/13/2017   LDLCALC 60 03/13/2017   LDLDIRECT 123.0 09/18/2014   TRIG 64.0 03/13/2017   CHOLHDL 3 03/13/2017  On Lipitor 80, Fenofibrate 160. - last eye exam was in 2018. + DR. Sees retina specialist: Dr. Coralyn Pear. - no numbness and tingling in her feet.  Pt has FH of DM in mother.  Pt also has HL, MCI.  ROS: Constitutional: + weight gain, no fatigue, no subjective hyperthermia/hypothermia, +  nocturia Eyes: no blurry vision, no xerophthalmia ENT: no sore throat, no nodules palpated in throat, no dysphagia/odynophagia, no hoarseness Cardiovascular: no CP/SOB/palpitations/leg swelling Respiratory: no cough/SOB Gastrointestinal: no N/V/D/C Musculoskeletal: + muscle/+ joint aches Skin: no rashes Neurological: + tremors/no numbness/tingling/dizziness Psychiatric: no depression/anxiety  Past Medical History:  Diagnosis Date  . Acute peptic ulcer of stomach    As a teenager  . Allergy    dust, cock roach,grass,weeds,molds  . Anxiety   . Asthma    02/08/10 FEV1 1.98 (80%), s/p saba 2.22 l/m (90%).nml  . Cognitive change 06/10/2016  . Depression   . Diabetes mellitus 2003   Type II  . Diverticulosis   . Dyspepsia   . Fatty liver 12/19/2010  . GERD (gastroesophageal reflux disease)   . High cholesterol   . Hyperlipemia   . Hypertension   . Hypothyroidism 09/08/2013  . IBS (irritable bowel syndrome)   . Internal hemorrhoids   . Joint pain 08/08/2016  . Low back pain 08/10/2016  . OSA (obstructive sleep apnea)   . Plantar fasciitis   . Sun-damaged skin 12/09/2016  . Vitamin D deficiency 03/04/2016   Past Surgical History:  Procedure Laterality Date  . APPENDECTOMY     age 66  . CATARACT EXTRACTION Bilateral 2016   Dr. Bing Plume  . CATARACT EXTRACTION, BILATERAL     12/8 and 12/14  .  COLONOSCOPY  06/03/2010, 08/25/2011   diverticulosis   . GASTRECTOMY     partial - ulcer as a teen  . HAND SURGERY Right   . HEMORRHOID BANDING    . KNEE ARTHROSCOPY Left 1998  . NASAL SEPTUM SURGERY    . RHINOPLASTY    . TOE SURGERY Right 1995   Social History   Social History  . Marital status: widowed    Spouse name: [name]  . Number of children: 4  . Years of education: N/A   Occupational History  . retired Orthoptist   Social History Main Topics  . Smoking status: Never Smoker  . Smokeless tobacco: Never Used  . Alcohol use No  . Drug use: No  . Sexual activity: No      Comment: lives with her mother, no dietary restrictions   Other Topics Concern  . Not on file   Social History Narrative   No caffeine drinks daily    Current Outpatient Prescriptions on File Prior to Visit  Medication Sig Dispense Refill  . ACCU-CHEK SOFTCLIX LANCETS lancets Use as directed twice daily to check blood sugar. DX E11.9 200 each 3  . Alcohol Swabs (B-D SINGLE USE SWABS REGULAR) PADS Use as directed to check blood sugar. DX E11.9 100 each 3  . atorvastatin (LIPITOR) 80 MG tablet TAKE 1 TABLET (80 MG) BY MOUTH DAILY. 90 tablet 2  . Blood Glucose Calibration (ACCU-CHEK AVIVA) SOLN Use as directed to check blood sugar.  DX E11.9 1 each 3  . Blood Glucose Monitoring Suppl (ACCU-CHEK AVIVA PLUS) w/Device KIT Use as directed to check blood sugar.  DX E11.9 1 kit 0  . Cholecalciferol (VITAMIN D3) 2000 units TABS Take 1 tablet by mouth daily.    Marland Kitchen donepezil (ARICEPT) 5 MG tablet Take 1 tablet (5 mg total) by mouth at bedtime. 30 tablet 2  . fenofibrate 160 MG tablet Take 1 tablet (160 mg total) by mouth daily. (Patient taking differently: Take 160 mg by mouth at bedtime. ) 90 tablet 2  . fluticasone (FLOVENT HFA) 110 MCG/ACT inhaler Inhale 1 puff into the lungs 2 (two) times daily. (Patient taking differently: Inhale 1 puff into the lungs 2 (two) times daily as needed. ) 1 Inhaler 12  . glimepiride (AMARYL) 4 MG tablet Take 1 tablet (4 mg total) by mouth 2 (two) times daily. 180 tablet 1  . glucose blood (ACCU-CHEK AVIVA PLUS) test strip Use as directed twice daily to check blood sugar.  DX E11.9 200 each 3  . levothyroxine (SYNTHROID, LEVOTHROID) 25 MCG tablet Take 1 tablet (25 mcg total) by mouth daily before breakfast. 90 tablet 2  . losartan (COZAAR) 50 MG tablet Take 1 tablet (50 mg total) by mouth daily. 90 tablet 1  . Multiple Vitamins-Minerals (CENTRUM SILVER PO) Take 1 tablet by mouth daily.      . pantoprazole (PROTONIX) 40 MG tablet Take 1 tablet (40 mg total) by mouth daily.  90 tablet 1  . Probiotic Product (ALIGN) 4 MG CAPS Take 1 capsule by mouth daily. Reported on 07/02/2015    . valACYclovir (VALTREX) 1000 MG tablet   6  . VENTOLIN HFA 108 (90 Base) MCG/ACT inhaler INHALE 2 PUFFS BY MOUTH INTO THE LUNGS EVERY 4 (FOUR) HOURS AS NEEDED FOR SHORTNESS OF BREATH AND WHEEZING 18 g 0  . diazepam (VALIUM) 5 MG tablet Take 0.5 to 1 tablet by mouth twice daily as needed for anxiety (Patient not taking: Reported on 03/18/2017) 40 tablet 2  .  escitalopram (LEXAPRO) 20 MG tablet Take 1 tablet (20 mg total) by mouth daily. (Patient not taking: Reported on 04/02/2017) 90 tablet 1  . famciclovir (FAMVIR) 500 MG tablet 1000 mg po bid x 24 hours then 250 mg po bid every day (Patient not taking: Reported on 04/02/2017) 35 tablet 3  . HYDROcodone-acetaminophen (NORCO/VICODIN) 5-325 MG tablet Take one by mouth at bedtime as needed for pain (Patient not taking: Reported on 03/18/2017) 10 tablet 0  . Mirabegron (MYRBETRIQ PO) Take by mouth.    . sitaGLIPtin (JANUVIA) 100 MG tablet Take 1 tablet (100 mg total) by mouth daily. (Patient not taking: Reported on 04/02/2017) 30 tablet 0   No current facility-administered medications on file prior to visit.    Allergies  Allergen Reactions  . Compazine [Prochlorperazine Edisylate]     Comma for 3 days  . Tetanus Toxoid Other (See Comments)    convulsions  . Sulfa Antibiotics Other (See Comments)    Unknown   Family History  Problem Relation Age of Onset  . Diabetes Mother        type II  . Heart attack Mother 9  . Stroke Mother   . Colon cancer Sister   . Asthma Maternal Grandmother   . Mental retardation Son        PTSD  . Parkinsonism Father   . Cancer Sister        stage 4 bladder cancer  . Colon polyps Sister   . Ulcers Unknown        bleeding gastric  . Heart defect Unknown        child  . Other Unknown        perforated bowel, child  . Amblyopia Neg Hx   . Blindness Neg Hx   . Cataracts Neg Hx   . Glaucoma Neg Hx   .  Macular degeneration Neg Hx   . Retinal detachment Neg Hx   . Strabismus Neg Hx   . Retinitis pigmentosa Neg Hx    PE: BP 140/78 (BP Location: Left Arm, Patient Position: Sitting)   Pulse 80   Ht 5' 3"  (1.6 m)   Wt 156 lb (70.8 kg)   SpO2 97%   BMI 27.63 kg/m  Wt Readings from Last 3 Encounters:  04/02/17 156 lb (70.8 kg)  03/13/17 155 lb 3.2 oz (70.4 kg)  12/09/16 147 lb (66.7 kg)   Constitutional: overweight - truncal obesity, in NAD Eyes: + R surgical pupil, EOMI, no exophthalmos ENT: moist mucous membranes, no thyromegaly, no cervical lymphadenopathy Cardiovascular: RRR, No MRG Respiratory: CTA B Gastrointestinal: abdomen soft, NT, ND, BS+ Musculoskeletal: no deformities, strength intact in all 4 Skin: moist, warm, no rashes Neurological: + tremor with outstretched hands, DTR normal in all 4  ASSESSMENT: 1. DM2, non-insulin-dependent, uncontrolled, with complications - PN - DR  PLAN:  1. Patient with long-standing, uncontrolled diabetes, on oral antidiabetic regimen, which became insufficient. Her sugars are high especially after meals. - we discussed about improving her diet which is very poor >> discussed healthier options. She saw nutrition in the past: "I know what I need to do, but I just cannot do it" - we are limited in our options by the cost of the medicines (we do not have samples). We called the pharmacy while pt was in the room >> almost all meds are >100$ per mo for her. She agreed to try Jardiance >> explained benefits and possible SEs - Will continue Glimepiride but will move doses before  meals as now she is taking the last dose at bedtime. - I advised her to: Patient Instructions  Please continue: - Glimepiride 4 mg 2x a day but move this before meals.  Add: - Jardiance 10 mg daily before b'fast  Please return in 2 months with your sugar log.   - Strongly advised her to start checking sugars at different times of the day - check 1-2 times a day,  rotating checks - given sugar log and advised how to fill it and to bring it at next appt  - given foot care handout and explained the principles  - given instructions for hypoglycemia management "15-15 rule"  - advised for yearly eye exams  - given flu shot today - Return to clinic in 1.5 mo with sugar log   Philemon Kingdom, MD PhD [facility] Endocrinology

## 2017-04-02 NOTE — Addendum Note (Signed)
Addended by: Linus Galas L on: 04/02/2017 05:00 PM   Modules accepted: Orders

## 2017-04-02 NOTE — Patient Instructions (Addendum)
Please continue: - Glimepiride 4 mg 2x a day but move this before meals.  Add: - Jardiance 10 mg daily before b'fast  Please return in 2 months with your sugar log.   PATIENT INSTRUCTIONS FOR TYPE 2 DIABETES:  DIET AND EXERCISE Diet and exercise is an important part of diabetic treatment.  We recommended aerobic exercise in the form of brisk walking (working between 40-60% of maximal aerobic capacity, similar to brisk walking) for 150 minutes per week (such as 30 minutes five days per week) along with 3 times per week performing 'resistance' training (using various gauge rubber tubes with handles) 5-10 exercises involving the major muscle groups (upper body, lower body and core) performing 10-15 repetitions (or near fatigue) each exercise. Start at half the above goal but build slowly to reach the above goals. If limited by weight, joint pain, or disability, we recommend daily walking in a swimming pool with water up to waist to reduce pressure from joints while allow for adequate exercise.    BLOOD GLUCOSES Monitoring your blood glucoses is important for continued management of your diabetes. Please check your blood glucoses 2-4 times a day: fasting, before meals and at bedtime (you can rotate these measurements - e.g. one day check before the 3 meals, the next day check before 2 of the meals and before bedtime, etc.).   HYPOGLYCEMIA (low blood sugar) Hypoglycemia is usually a reaction to not eating, exercising, or taking too much insulin/ other diabetes drugs.  Symptoms include tremors, sweating, hunger, confusion, headache, etc. Treat IMMEDIATELY with 15 grams of Carbs: . 4 glucose tablets .  cup regular juice/soda . 2 tablespoons raisins . 4 teaspoons sugar . 1 tablespoon honey Recheck blood glucose in 15 mins and repeat above if still symptomatic/blood glucose <100.  RECOMMENDATIONS TO REDUCE YOUR RISK OF DIABETIC COMPLICATIONS: * Take your prescribed MEDICATION(S) * Follow a  DIABETIC diet: Complex carbs, fiber rich foods, (monounsaturated and polyunsaturated) fats * AVOID saturated/trans fats, high fat foods, >2,300 mg salt per day. * EXERCISE at least 5 times a week for 30 minutes or preferably daily.  * DO NOT SMOKE OR DRINK more than 1 drink a day. * Check your FEET every day. Do not wear tightfitting shoes. Contact us if you develop an ulcer * See your EYE doctor once a year or more if needed * Get a FLU shot once a year * Get a PNEUMONIA vaccine once before and once after age 55 years  GOALS:  * Your Hemoglobin A1c of <7%  * fasting sugars need to be <130 * after meals sugars need to be <180 (2h after you start eating) * Your Systolic BP should be 427 or lower  * Your Diastolic BP should be 80 or lower  * Your HDL (Good Cholesterol) should be 40 or higher  * Your LDL (Bad Cholesterol) should be 100 or lower. * Your Triglycerides should be 150 or lower  * Your Urine microalbumin (kidney function) should be <30 * Your Body Mass Index should be 25 or lower    Please consider the following ways to cut down carbs and fat and increase fiber and micronutrients in your diet: - substitute whole grain for white bread or pasta - substitute brown rice for white rice - substitute 90-calorie flat bread pieces for slices of bread when possible - substitute sweet potatoes or yams for white potatoes - substitute humus for margarine - substitute tofu for cheese when possible - substitute almond or rice milk  for regular milk (would not drink soy milk daily due to concern for soy estrogen influence on breast cancer risk) - substitute dark chocolate for other sweets when possible - substitute water - can add lemon or orange slices for taste - for diet sodas (artificial sweeteners will trick your body that you can eat sweets without getting calories and will lead you to overeating and weight gain in the long run) - do not skip breakfast or other meals (this will slow down  the metabolism and will result in more weight gain over time)  - can try smoothies made from fruit and almond/rice milk in am instead of regular breakfast - can also try old-fashioned (not instant) oatmeal made with almond/rice milk in am - order the dressing on the side when eating salad at a restaurant (pour less than half of the dressing on the salad) - eat as little meat as possible - can try juicing, but should not forget that juicing will get rid of the fiber, so would alternate with eating raw veg./fruits or drinking smoothies - use as little oil as possible, even when using olive oil - can dress a salad with a mix of balsamic vinegar and lemon juice, for e.g. - use agave nectar, stevia sugar, or regular sugar rather than artificial sweateners - steam or broil/roast veggies  - snack on veggies/fruit/nuts (unsalted, preferably) when possible, rather than processed foods - reduce or eliminate aspartame in diet (it is in diet sodas, chewing gum, etc) Read the labels!  Try to read Dr. Janene Harvey book: "Program for Reversing Diabetes" for other ideas for healthy eating.

## 2017-04-03 ENCOUNTER — Other Ambulatory Visit: Payer: Self-pay | Admitting: Family Medicine

## 2017-04-06 MED FILL — valACYclovir HCL 1 GM TABS: 1 | 30 days supply | Qty: 60 | Fill #1

## 2017-04-10 ENCOUNTER — Ambulatory Visit: Payer: Self-pay | Admitting: Neurology

## 2017-04-14 ENCOUNTER — Encounter (HOSPITAL_COMMUNITY): Payer: Self-pay | Admitting: Psychiatry

## 2017-04-14 ENCOUNTER — Encounter: Payer: Self-pay | Admitting: Neurology

## 2017-04-14 ENCOUNTER — Ambulatory Visit (INDEPENDENT_AMBULATORY_CARE_PROVIDER_SITE_OTHER): Payer: Medicare HMO | Admitting: Psychiatry

## 2017-04-14 VITALS — BP 152/80 | HR 90 | Ht 62.0 in | Wt 155.8 lb

## 2017-04-14 DIAGNOSIS — G3184 Mild cognitive impairment, so stated: Secondary | ICD-10-CM

## 2017-04-14 DIAGNOSIS — F32A Depression, unspecified: Secondary | ICD-10-CM

## 2017-04-14 DIAGNOSIS — Z79899 Other long term (current) drug therapy: Secondary | ICD-10-CM | POA: Diagnosis not present

## 2017-04-14 DIAGNOSIS — F329 Major depressive disorder, single episode, unspecified: Secondary | ICD-10-CM | POA: Diagnosis not present

## 2017-04-14 MED ORDER — ESCITALOPRAM OXALATE 20 MG PO TABS: 20.0000 mg | ORAL_TABLET | Freq: Every day | ORAL | 1 refills | Status: DC

## 2017-04-14 MED ORDER — DONEPEZIL HCL 5 MG PO TABS: 5.0000 mg | ORAL_TABLET | Freq: Every day | ORAL | 2 refills | Status: DC

## 2017-04-14 MED FILL — DONEPEZIL HCL 5 MG TABLET: 5 | 30 days supply | Qty: 30 | Fill #0

## 2017-04-14 NOTE — Progress Notes (Signed)
[place] MD/PA/NP OP Progress Note  04/14/2017 10:25 AM Crystal [name]  MRN:  [id number]  Chief Complaint: med check, doing well  HPI: Crystal [name] is a 66 year old female with mild cognitive impairment, and unspecified depression who presents today for med management follow-up. She reports that she is doing well on Lexapro 20 mg. It's unclear if primary care increase this, but she reports that her mood is good. She reports that she is getting along well with her daughters, and denies any significant anxiety symptoms or panic. We spent time reviewing her medications and reviewing their indications. She continues to be somewhat confused about some of her medical medications and I encouraged her to discuss with her PCP to review what we discussed as well.    She shares the wonderful moves that she has started working about 4 weeks ago, a part-time job, at [employer]. 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 She reports that she really likes the job and enjoys the work, and feels very proud of herself for this. She does struggle with some forgetfulness at work, and makes careless mistakes because she forgets where she is in the baking process at times.  She forgot to start the Aricept which was prescribed at the last visit, so we reiterated that and I provided her written instructions to pick up the Aricept and start this evening. We reviewed the risks and benefits and expected side effects. We will follow-up in 3 months or sooner if needed.   Visit Diagnosis:    ICD-10-CM   1. Depression, unspecified depression type F32.9   2. Mild cognitive impairment G31.84 donepezil (ARICEPT) 5 MG tablet    Past Psychiatric History: See intake H&P for full details. Reviewed, with no updates at this time.   Past Medical History:  Past Medical History:  Diagnosis Date  . Acute peptic ulcer of stomach    As a teenager  . Allergy     dust, cock roach,grass,weeds,molds  . Anxiety   . Asthma    02/08/10 FEV1 1.98 (80%), s/p saba 2.22 l/m (90%).nml  . Cognitive change 06/10/2016  . Depression   . Diabetes mellitus 2003   Type II  . Diverticulosis   . Dyspepsia   . Fatty liver 12/19/2010  . GERD (gastroesophageal reflux disease)   . High cholesterol   . Hyperlipemia   . Hypertension   . Hypothyroidism 09/08/2013  . IBS (irritable bowel syndrome)   . Internal hemorrhoids   . Joint pain 08/08/2016  . Low back pain 08/10/2016  . OSA (obstructive sleep apnea)   . Plantar fasciitis   . Sun-damaged skin 12/09/2016  . Vitamin D deficiency 03/04/2016    Past Surgical History:  Procedure Laterality Date  . APPENDECTOMY     age 36  . CATARACT EXTRACTION Bilateral 2016   Dr. Bing Plume  . CATARACT EXTRACTION, BILATERAL     12/8 and 12/14  . COLONOSCOPY  06/03/2010, 08/25/2011   diverticulosis   . GASTRECTOMY     partial - ulcer as a teen  . HAND SURGERY Right   . HEMORRHOID BANDING    . KNEE ARTHROSCOPY Left 1998  . NASAL SEPTUM SURGERY    . RHINOPLASTY    . TOE SURGERY Right 1995    Family Psychiatric History: See intake H&P for full details. Reviewed, with no updates at this time.   Family History:  Family History  Problem Relation Age of Onset  . Diabetes Mother        type II  . Heart attack Mother 67  . Stroke Mother   . Colon cancer Sister   . Asthma Maternal Grandmother   . Mental retardation Son        PTSD  . Parkinsonism Father   . Cancer Sister        stage 4 bladder cancer  . Colon polyps Sister   . Ulcers Unknown        bleeding gastric  . Heart defect Unknown        child  . Other Unknown        perforated bowel, child  . Amblyopia Neg Hx   . Blindness Neg Hx   . Cataracts Neg Hx   . Glaucoma Neg Hx   . Macular degeneration Neg Hx   . Retinal detachment Neg Hx   . Strabismus Neg Hx   . Retinitis pigmentosa Neg Hx     Social History:  Social History   Social History  . Marital  status: Married    Spouse name: [name]  . Number of children: 4  . Years of education: N/A   Occupational History  . retired Orthoptist   Social History Main Topics  . Smoking status: Never Smoker  . Smokeless tobacco: Never Used  . Alcohol use No  . Drug use: No  . Sexual activity: No     Comment: lives with her mother, no dietary restrictions   Other Topics Concern  . None   Social History Narrative   No caffeine drinks daily     Allergies:  Allergies  Allergen Reactions  . Compazine [Prochlorperazine Edisylate]     Comma for 3 days  . Tetanus Toxoid Other (See Comments)    convulsions  . Sulfa Antibiotics Other (See Comments)    Unknown    Metabolic Disorder Labs: Lab Results  Component Value Date   HGBA1C 7.3 (H) 03/13/2017   MPG 160 (H) 08/03/2015   MPG 151 (H) 12/27/2013   No results found for: PROLACTIN Lab Results  Component Value Date   CHOL 116 03/13/2017   TRIG 64.0 03/13/2017   HDL 43.70 03/13/2017   CHOLHDL 3 03/13/2017   VLDL 12.8 03/13/2017   LDLCALC 60 03/13/2017   LDLCALC 73 11/28/2016   Lab Results  Component Value Date   TSH 3.27 03/13/2017   TSH 3.02 11/28/2016    Therapeutic Level Labs: No results found for: LITHIUM No results found for: VALPROATE No components found for:  CBMZ  Current Medications: Current Outpatient Prescriptions  Medication Sig Dispense Refill  . ACCU-CHEK SOFTCLIX LANCETS lancets Use as directed twice daily to check blood sugar. DX E11.9 200 each 3  . Alcohol Swabs (B-D SINGLE USE SWABS REGULAR) PADS Use as directed to check blood sugar. DX E11.9 100 each 3  . atorvastatin (LIPITOR) 80 MG tablet TAKE 1 TABLET EVERY DAY 90 tablet 2  . Blood Glucose Calibration (ACCU-CHEK AVIVA) SOLN Use as directed to check blood sugar.  DX E11.9 1 each 3  . Blood Glucose Monitoring Suppl (ACCU-CHEK AVIVA PLUS) w/Device KIT Use as directed to check blood sugar.  DX E11.9 1 kit 0  . Cholecalciferol (VITAMIN D3) 2000 units TABS  Take 1 tablet by mouth daily.    Marland Kitchen donepezil (ARICEPT) 5 MG tablet Take 1 tablet (5 mg total) by mouth at bedtime. 30 tablet 2  . empagliflozin (JARDIANCE) 10 MG TABS tablet  Take 10 mg by mouth daily. 30 tablet 5  . fenofibrate 160 MG tablet Take 1 tablet (160 mg total) by mouth daily. (Patient taking differently: Take 160 mg by mouth at bedtime. ) 90 tablet 2  . glimepiride (AMARYL) 4 MG tablet Take 1 tablet (4 mg total) by mouth 2 (two) times daily. 180 tablet 1  . glucose blood (ACCU-CHEK AVIVA PLUS) test strip Use as directed twice daily to check blood sugar.  DX E11.9 200 each 3  . levothyroxine (SYNTHROID, LEVOTHROID) 25 MCG tablet Take 1 tablet (25 mcg total) by mouth daily before breakfast. 90 tablet 2  . losartan (COZAAR) 50 MG tablet Take 1 tablet (50 mg total) by mouth daily. 90 tablet 1  . Multiple Vitamins-Minerals (CENTRUM SILVER PO) Take 1 tablet by mouth daily.      . pantoprazole (PROTONIX) 40 MG tablet TAKE 1 TABLET (40 MG TOTAL) BY MOUTH DAILY. 90 tablet 0  . Probiotic Product (ALIGN) 4 MG CAPS Take 1 capsule by mouth daily. Reported on 07/02/2015    . valACYclovir (VALTREX) 1000 MG tablet   6  . VENTOLIN HFA 108 (90 Base) MCG/ACT inhaler INHALE 2 PUFFS BY MOUTH INTO THE LUNGS EVERY 4 (FOUR) HOURS AS NEEDED FOR SHORTNESS OF BREATH AND WHEEZING 18 g 0  . escitalopram (LEXAPRO) 20 MG tablet Take 1 tablet (20 mg total) by mouth daily. 90 tablet 1  . famciclovir (FAMVIR) 500 MG tablet 1000 mg po bid x 24 hours then 250 mg po bid every day (Patient not taking: Reported on 04/02/2017) 35 tablet 3   No current facility-administered medications for this visit.      Musculoskeletal: Strength & Muscle Tone: within normal limits Gait & Station: normal Patient leans: N/A  Psychiatric Specialty Exam: Review of Systems  Constitutional: Negative.   HENT: Negative.   Respiratory: Negative.   Cardiovascular: Negative.   Gastrointestinal: Negative.   Musculoskeletal: Negative.    Neurological: Negative.   Psychiatric/Behavioral: Positive for memory loss. Negative for depression, hallucinations, substance abuse and suicidal ideas. The patient is not nervous/anxious and does not have insomnia.     Blood pressure (!) 152/80, pulse 90, height 5' 2" (1.575 m), weight 155 lb 12.8 oz (70.7 kg).Body mass index is 28.5 kg/m.  General Appearance: Casual and Fairly Groomed  Eye Contact:  Good  Speech:  Clear and Coherent  Volume:  Normal  Mood:  Euthymic and good cheer  Affect:  Appropriate and Congruent  Thought Process:  Goal Directed  Orientation:  Full (Time, Place, and Person)  Thought Content: Logical   Suicidal Thoughts:  No  Homicidal Thoughts:  No  Memory:  Immediate;   Fair  Judgement:  Good  Insight:  Good  Psychomotor Activity:  Normal  Concentration:  Concentration: Fair  Recall:  Roy of Knowledge: Good  Language: Good  Akathisia:  Negative  Handed:  Right  AIMS (if indicated): not done  Assets:  Communication Skills Desire for Improvement Financial Resources/Insurance Housing Resilience Social Support Transportation Vocational/Educational  ADL's:  Intact  Cognition: Impaired,  Mild  Sleep:  Good   Screenings: Mini-Mental     Office Visit from 10/06/2016 in [facility] at [place] from 07/02/2015 in [place] at [facility]  Total Score (max 30 points )  29  28    PHQ2-9     Office Visit from 12/09/2016 in [facility] at [place] Visit  from 10/06/2016 in [facility] at [place] Visit from 08/08/2016 in [place] [facility] [place] from [facility]/13/2015 in [facility] Clinical Support from 07/02/2015 in [place] at [facility]  PHQ-2 Total Score  0  1  0  0  1       Assessment and Plan:  Crystal [name] is a 66 year old female with unspecified depressive disorder, and mild cognitive impairment, who presents today for medication management follow-up after our initial intake. She appears to be doing well in terms of her mood, but still struggling with mild cognitive symptoms, memory impairment, and some mild attentional difficulties.  She forgot to pick up and start the Aricept, and agrees to pick it up today and initiate as prescribed. No acute safety issues, and I have encouraged her to continue positive behavioral activation efforts as she is doing.  1. Depression, unspecified depression type   2. Mild cognitive impairment     I spent 20 minutes with the patient in direct care and counseling. We discussed her progress in terms of behavioral activation, and a recent job. We also discussed medication management including continuation of Lexapro and initiation of Aricept, reviewed the side effects and risks and benefits.     Aundra Dubin, MD 04/14/2017, 10:25 AM

## 2017-04-15 ENCOUNTER — Ambulatory Visit: Payer: Self-pay | Admitting: Psychology

## 2017-04-16 ENCOUNTER — Telehealth: Payer: Self-pay | Admitting: Neurology

## 2017-04-16 NOTE — Telephone Encounter (Signed)
Patient dismissed from [facility] by Ellouise Newer MD , effective April 14, 2017. Dismissal letter sent out by certified / registered mail.  daj

## 2017-04-27 ENCOUNTER — Ambulatory Visit: Payer: Self-pay | Admitting: Family Medicine

## 2017-04-29 NOTE — Progress Notes (Signed)
[facility] Note  04/30/2017     CHIEF COMPLAINT Patient presents for Retina Follow Up   HISTORY OF PRESENT ILLNESS: Crystal [name] is a 66 y.o. female who presents to the clinic today for:   HPI    Retina Follow Up  Patient presents with  Diabetic Retinopathy.  In right eye.  Since onset it is rapidly improving.  I, the attending physician,  performed the HPI with the patient and updated documentation appropriately.        Comments  POV#3 S/P laser retinopexy 03/18/17 Patient states floaters are gone OD. Denies pain and  Flashes. She is not using eye gtts any longer. She is takes multivitamin One a Day Qd. CBG 111 this am. A1c to be drawn next week. Patient feels she her  Vision is stable.      Last edited by Bernarda Caffey, MD on 04/30/2017  9:36 AM. (History)      Referring physician: [name], MD [address] [place], [place] [address]  HISTORICAL INFORMATION:   Selected notes from the MEDICAL RECORD NUMBER Referral from Dr. Bing Plume for retina eval: - last exam by [facility], 03/17/17 - 2 wk history of floaters OU - hx of DM2 since 2003 - pseudophakia OU (complex CEIOL OD, 12.14.16, OS: 11.28.16)   CURRENT MEDICATIONS: Current Outpatient Prescriptions (Ophthalmic Drugs)  Medication Sig  . prednisoLONE acetate (PRED FORTE) 1 % ophthalmic suspension    No current facility-administered medications for this visit.  (Ophthalmic Drugs)   Current Outpatient Prescriptions (Other)  Medication Sig  . ACCU-CHEK SOFTCLIX LANCETS lancets Use as directed twice daily to check blood sugar. DX E11.9  . Alcohol Swabs (B-D SINGLE USE SWABS REGULAR) PADS Use as directed to check blood sugar. DX E11.9  . atorvastatin (LIPITOR) 80 MG tablet TAKE 1 TABLET EVERY DAY  . Blood Glucose Calibration (ACCU-CHEK AVIVA) SOLN Use as directed to check blood sugar.  DX E11.9  . Blood Glucose Monitoring Suppl (ACCU-CHEK AVIVA PLUS) w/Device KIT Use as  directed to check blood sugar.  DX E11.9  . Cholecalciferol (VITAMIN D3) 2000 units TABS Take 1 tablet by mouth daily.  Marland Kitchen donepezil (ARICEPT) 5 MG tablet Take 1 tablet (5 mg total) by mouth at bedtime.  . empagliflozin (JARDIANCE) 10 MG TABS tablet Take 10 mg by mouth daily.  Marland Kitchen escitalopram (LEXAPRO) 20 MG tablet Take 1 tablet (20 mg total) by mouth daily.  . famciclovir (FAMVIR) 500 MG tablet 1000 mg po bid x 24 hours then 250 mg po bid every day  . fenofibrate 160 MG tablet Take 1 tablet (160 mg total) by mouth daily. (Patient taking differently: Take 160 mg by mouth at bedtime. )  . glimepiride (AMARYL) 4 MG tablet Take 1 tablet (4 mg total) by mouth 2 (two) times daily.  Marland Kitchen glucose blood (ACCU-CHEK AVIVA PLUS) test strip Use as directed twice daily to check blood sugar.  DX E11.9  . levothyroxine (SYNTHROID, LEVOTHROID) 25 MCG tablet Take 1 tablet (25 mcg total) by mouth daily before breakfast.  . losartan (COZAAR) 50 MG tablet Take 1 tablet (50 mg total) by mouth daily.  . Multiple Vitamins-Minerals (CENTRUM SILVER PO) Take 1 tablet by mouth daily.    . pantoprazole (PROTONIX) 40 MG tablet TAKE 1 TABLET (40 MG TOTAL) BY MOUTH DAILY.  . Probiotic Product (ALIGN) 4 MG CAPS Take 1 capsule by mouth daily. Reported on 07/02/2015  . valACYclovir (VALTREX) 1000 MG  tablet   . VENTOLIN HFA 108 (90 Base) MCG/ACT inhaler INHALE 2 PUFFS BY MOUTH INTO THE LUNGS EVERY 4 (FOUR) HOURS AS NEEDED FOR SHORTNESS OF BREATH AND WHEEZING   No current facility-administered medications for this visit.  (Other)      REVIEW OF SYSTEMS: ROS    Positive for: Gastrointestinal, Musculoskeletal, Endocrine, Cardiovascular, Eyes, Respiratory, Psychiatric   Negative for: Constitutional, Neurological, Skin, Genitourinary, HENT, Allergic/Imm, Heme/Lymph   Last edited by Zenovia Jordan, LPN on 89/78/4784  1:28 AM. (History)       ALLERGIES Allergies  Allergen Reactions  . Compazine [Prochlorperazine Edisylate]      Comma for 3 days  . Tetanus Toxoid Other (See Comments)    convulsions  . Sulfa Antibiotics Other (See Comments)    Unknown    PAST MEDICAL HISTORY Past Medical History:  Diagnosis Date  . Acute peptic ulcer of stomach    As a teenager  . Allergy    dust, cock roach,grass,weeds,molds  . Anxiety   . Asthma    02/08/10 FEV1 1.98 (80%), s/p saba 2.22 l/m (90%).nml  . Cognitive change 06/10/2016  . Depression   . Diabetes mellitus 2003   Type II  . Diverticulosis   . Dyspepsia   . Fatty liver 12/19/2010  . GERD (gastroesophageal reflux disease)   . High cholesterol   . Hyperlipemia   . Hypertension   . Hypothyroidism 09/08/2013  . IBS (irritable bowel syndrome)   . Internal hemorrhoids   . Joint pain 08/08/2016  . Low back pain 08/10/2016  . OSA (obstructive sleep apnea)   . Plantar fasciitis   . Sun-damaged skin 12/09/2016  . Vitamin D deficiency 03/04/2016   Past Surgical History:  Procedure Laterality Date  . APPENDECTOMY     age 70  . CATARACT EXTRACTION Bilateral 2016   Dr. Bing Plume  . CATARACT EXTRACTION, BILATERAL     12/8 and 12/14  . COLONOSCOPY  06/03/2010, 08/25/2011   diverticulosis   . GASTRECTOMY     partial - ulcer as a teen  . HAND SURGERY Right   . HEMORRHOID BANDING    . KNEE ARTHROSCOPY Left 1998  . NASAL SEPTUM SURGERY    . RHINOPLASTY    . TOE SURGERY Right 1995    FAMILY HISTORY Family History  Problem Relation Age of Onset  . Diabetes Mother        type II  . Heart attack Mother 43  . Stroke Mother   . Colon cancer Sister   . Asthma Maternal Grandmother   . Mental retardation Son        PTSD  . Parkinsonism Father   . Cancer Sister        stage 4 bladder cancer  . Colon polyps Sister   . Ulcers Unknown        bleeding gastric  . Heart defect Unknown        child  . Other Unknown        perforated bowel, child  . Amblyopia Neg Hx   . Blindness Neg Hx   . Cataracts Neg Hx   . Glaucoma Neg Hx   . Macular degeneration Neg Hx   .  Retinal detachment Neg Hx   . Strabismus Neg Hx   . Retinitis pigmentosa Neg Hx     SOCIAL HISTORY Social History  Substance Use Topics  . Smoking status: Never Smoker  . Smokeless tobacco: Never Used  . Alcohol use No  OPHTHALMIC EXAM:  Base Eye Exam    Visual Acuity (Snellen - Linear)      Right Left   Dist [place] 20/40 +2 20/25   Dist ph [place] 20/25 -2 20/20       Tonometry (Tonopen, 9:06 AM)      Right Left   Pressure 20 17       Pupils      Dark Light Shape React APD   Right 5 4.5 Irregular 1 None   Left 4 3 Round 1 None       Visual Fields (Counting fingers)      Left Right    Full Full   Previous vf defect noted os. None noted today.       Extraocular Movement      Right Left    Full, Ortho Full, Ortho       Neuro/Psych    Oriented x3:  Yes   Mood/Affect:  Normal       Dilation    Both eyes:  1.0% Mydriacyl, 2.5% Phenylephrine @ 9:06 AM        Slit Lamp and Fundus Exam    External Exam      Right Left   External Normal Normal       Slit Lamp Exam      Right Left   Lids/Lashes Dermatochalasis - upper lid temporally  Dermatochalasis - upper lid temporally    Conjunctiva/Sclera White and quiet White and quiet   Cornea Nylon mattress suture at 0900; cataract wound closed at 0900 Clear   Anterior Chamber Deep and quiet Deep and quiet   Iris Round and dilated; 6.75 mm; TID at temporal pupil margin Round and dilated; 6.75 mm; PPM at 0600    Lens Three piece Posterior chamber intraocular lens ?sulcus Posterior chamber intraocular lens - good position   Vitreous Vitreous syneresis Vitreous syneresis; Posterior vitreous detachment       Fundus Exam      Right Left   Disc Normal rim; cupped Normal rim, cupped   C/D Ratio 0.7 0.65   Macula ERM; blunted foveal reflex; edema improved Normal foveal reflex   Vessels Vascular attenuation - mild Vascular attenuation - mild   Periphery flat; attached; trace RPE changes; hair line Retinal tears at 3  and 4 o'clock w/ good laser scars surrounding -- no SRF; Degeneration - peripheral cystoid; supratemporal cobblestoning  Flat; attached; trace RPE changes; , Degeneration - peripheral cystoid          IMAGING AND PROCEDURES  Imaging and Procedures for 04/30/17  OCT, Retina - OU - Both Eyes     Right Eye Quality was good. Central Foveal Thickness: 375. Progression has improved. Findings include abnormal foveal contour, epiretinal membrane, no IRF.   Left Eye Quality was good. Central Foveal Thickness: 279. Progression has improved. Findings include normal foveal contour, no IRF, no SRF.   Notes Images taken, stored on drive   Diagnosis / Impression:  OD - ERM, interval improvement in cystic changes; slight ellipsoid bump of SRF subfoveal OS - NFP; no IRF/SRF  Clinical management:  See below  Abbreviations: NFP - Normal foveal profile. CME - cystoid macular edema. PED - pigment epithelial detachment. IRF - intraretinal fluid. SRF - subretinal fluid. EZ - ellipsoid zone. ERM - epiretinal membrane. ORA - outer retinal atrophy. ORT - outer retinal tubulation. SRHM - subretinal hyper-reflective material                ASSESSMENT/PLAN:  ICD-10-CM   1. 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 Posterior vitreous detachment of both eyes H43.813   3. Type 2 diabetes mellitus without complication, without long-term current use of insulin (HCC) E11.9   4. Pseudophakia of both eyes Z96.1   5. CME (cystoid macular edema), right H35.351 OCT, Retina - OU - Both Eyes  6. Epiretinal membrane (ERM) of right eye H35.371     1. 2 peripheral retinal tears OD  Small flap tears at ora, 0300 and 0400 oclock  No SRF or associated RD  s/p barrier laser retinopexy OD, 03/18/17  Doing great; Good laser in place  Completed post laser drops  Stable  2. PVD / vitreous syneresis OU  Discussed findings and prognosis  No RT or RD on 360 scleral depressed exam  OS  Reviewed s/s of RT/RD  Strict return precautions for any such RT/RD signs/symptoms  3. DM2 without retinopathy  On oral meds  Discussed importance of tight BG and BP control  4. Pseudophakia OU  IOLs in good position OU -- beautiful surgeries by Dr. Hazle Quant  OD with history of anterior vitrectomy and 3 piece sulcus IOL  Monitor  5. CME OD  Now resolved  Etiology could be multifactorial -- ERM, delayed post-cataract  Pt ran out of drops 2 wks ago and has remained off drops -- okay  6. Epiretinal membrane, right eye   The natural history, anatomy, potential for loss of vision, and treatment options including vitrectomy techniques and the complications of endophthalmitis, retinal detachment, vitreous hemorrhage and permanent vision loss discussed with the patient  Mild ERM OD  May have contributed to CME OD (#5)  Not yet surgical -- monitor for pucker / symptoms  F/u in 1 year     Ophthalmic Meds Ordered this visit:  No orders of the defined types were placed in this encounter.      Return in about 1 year (around 04/30/2018) for Dilated exam.  There are no Patient Instructions on file for this visit.   Explained the diagnoses, plan, and follow up with the patient and they expressed understanding.  Patient expressed understanding of the importance of proper follow up care.   Karie Chimera, M.D., Ph.D. Diseases & Surgery of the Retina and Vitreous [facility] 04/30/17       Abbreviations: M myopia (nearsighted); A astigmatism; H hyperopia (farsighted); P presbyopia; Mrx spectacle prescription;  CTL contact lenses; OD right eye; OS left eye; OU both eyes  XT exotropia; ET esotropia; PEK punctate epithelial keratitis; PEE punctate epithelial erosions; DES dry eye syndrome; MGD meibomian gland dysfunction; ATs artificial tears; PFAT's preservative free artificial tears; NSC nuclear sclerotic cataract; PSC posterior subcapsular cataract; ERM  epi-retinal membrane; PVD posterior vitreous detachment; RD retinal detachment; DM diabetes mellitus; DR diabetic retinopathy; NPDR non-proliferative diabetic retinopathy; PDR proliferative diabetic retinopathy; CSME clinically significant macular edema; DME diabetic macular edema; dbh dot blot hemorrhages; CWS cotton wool spot; POAG primary open angle glaucoma; C/D cup-to-disc ratio; HVF humphrey visual field; GVF goldmann visual field; OCT optical coherence tomography; IOP intraocular pressure; BRVO Branch retinal vein occlusion; CRVO central retinal vein occlusion; CRAO central retinal artery occlusion; BRAO branch retinal artery occlusion; RT retinal tear; SB scleral buckle; PPV pars plana vitrectomy; VH Vitreous hemorrhage; PRP panretinal laser photocoagulation; IVK intravitreal kenalog; VMT vitreomacular traction; MH Macular hole;  NVD neovascularization of the disc; NVE neovascularization elsewhere; AREDS age related eye disease study; ARMD age related macular  degeneration; POAG primary open angle glaucoma; EBMD epithelial/anterior basement membrane dystrophy; ACIOL anterior chamber intraocular lens; IOL intraocular lens; PCIOL posterior chamber intraocular lens; Phaco/IOL phacoemulsification with intraocular lens placement; [place] photorefractive keratectomy; LASIK laser assisted in situ keratomileusis; HTN hypertension; DM diabetes mellitus; COPD chronic obstructive pulmonary disease

## 2017-04-30 ENCOUNTER — Ambulatory Visit (INDEPENDENT_AMBULATORY_CARE_PROVIDER_SITE_OTHER): Payer: Medicare HMO | Admitting: Ophthalmology

## 2017-04-30 ENCOUNTER — Encounter (INDEPENDENT_AMBULATORY_CARE_PROVIDER_SITE_OTHER): Payer: Self-pay | Admitting: Ophthalmology

## 2017-04-30 DIAGNOSIS — H43813 Vitreous degeneration, bilateral: Secondary | ICD-10-CM

## 2017-04-30 DIAGNOSIS — H33331 Multiple defects of retina without detachment, right eye: Secondary | ICD-10-CM | POA: Diagnosis not present

## 2017-04-30 DIAGNOSIS — E119 Type 2 diabetes mellitus without complications: Secondary | ICD-10-CM | POA: Diagnosis not present

## 2017-04-30 DIAGNOSIS — H35371 Puckering of macula, right eye: Secondary | ICD-10-CM

## 2017-04-30 DIAGNOSIS — Z961 Presence of intraocular lens: Secondary | ICD-10-CM

## 2017-04-30 DIAGNOSIS — H35351 Cystoid macular degeneration, right eye: Secondary | ICD-10-CM | POA: Diagnosis not present

## 2017-05-07 ENCOUNTER — Encounter: Payer: Self-pay | Admitting: Family Medicine

## 2017-05-07 ENCOUNTER — Ambulatory Visit (INDEPENDENT_AMBULATORY_CARE_PROVIDER_SITE_OTHER): Payer: Medicare HMO | Admitting: Family Medicine

## 2017-05-07 VITALS — BP 134/78 | HR 66 | Temp 97.8°F | Resp 18 | Wt 155.8 lb

## 2017-05-07 DIAGNOSIS — I10 Essential (primary) hypertension: Secondary | ICD-10-CM

## 2017-05-07 DIAGNOSIS — E559 Vitamin D deficiency, unspecified: Secondary | ICD-10-CM | POA: Diagnosis not present

## 2017-05-07 DIAGNOSIS — B070 Plantar wart: Secondary | ICD-10-CM | POA: Diagnosis not present

## 2017-05-07 DIAGNOSIS — E785 Hyperlipidemia, unspecified: Secondary | ICD-10-CM | POA: Diagnosis not present

## 2017-05-07 DIAGNOSIS — E1149 Type 2 diabetes mellitus with other diabetic neurological complication: Secondary | ICD-10-CM

## 2017-05-07 DIAGNOSIS — E039 Hypothyroidism, unspecified: Secondary | ICD-10-CM

## 2017-05-07 MED ORDER — MONTELUKAST SODIUM 10 MG PO TABS: 10.0000 mg | ORAL_TABLET | Freq: Every evening | ORAL | 3 refills | Status: DC | PRN

## 2017-05-07 MED ORDER — FLUTICASONE PROPIONATE 50 MCG/ACT NA SUSP: 2.0000 | Freq: Every day | NASAL | 3 refills | Status: DC

## 2017-05-07 MED ORDER — CETIRIZINE HCL 10 MG PO TABS: 10.0000 mg | ORAL_TABLET | Freq: Every day | ORAL | 11 refills | Status: AC | PRN

## 2017-05-07 MED FILL — ALL DAY ALLERGY 10 MG TAB: 10 | 100 days supply | Qty: 100 | Fill #0

## 2017-05-07 MED FILL — MONTELUKAST SOD 10 MG TAB: 10 | 30 days supply | Qty: 30 | Fill #0

## 2017-05-07 MED FILL — FLUTICASONE PROP 50 MCG SPR: 50 | 30 days supply | Qty: 16 | Fill #0

## 2017-05-07 NOTE — Progress Notes (Signed)
Subjective:  I acted as a Education administrator for Dr. Charlett Blake. [name], [place]  Patient ID: Crystal [name], female    DOB: Feb 27, 1951, 66 y.o.   MRN: [id number]  No chief complaint on file.   HPI  Patient is in today for a 6 week follow up she is following up on her HTn, DM and other medical concerns, she feels well today. 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 This is manageable and not limiting her work. No recent febrile illness or hospitalization. No polyria or polydipsia. In past week lowest BS was 115 and highest 270 when she ate badly. Denies CP/palp/SOB/HA/congestion/fevers/GI or GU c/o. Taking meds as prescribed  Patient Care Team: [name], MD as PCP - General (Family Medicine) Calvert Cantor, MD as Consulting Physician (Ophthalmology) Gatha Mayer, MD as Consulting Physician (Gastroenterology) Leinbach, Sol Blazing, MD as Referring Physician (Otolaryngology) Dene Gentry, MD as Consulting Physician (Sports Medicine) Rigoberto Noel, MD as Consulting Physician (Pulmonary Disease) Camelia Phenes, DPM as Consulting Physician (Podiatry) Cameron Sprang, MD as Consulting Physician (Neurology) Phipps, C. Shanon Brow, MD as Consulting Physician (Otolaryngology)   Past Medical History:  Diagnosis Date  . Acute peptic ulcer of stomach    As a teenager  . Allergy    dust, cock roach,grass,weeds,molds  . Anxiety   . Asthma    02/08/10 FEV1 1.98 (80%), s/p saba 2.22 l/m (90%).nml  . Cognitive change 06/10/2016  . Depression   . Diabetes mellitus 2003   Type II  . Diverticulosis   . Dyspepsia   . Fatty liver 12/19/2010  . GERD (gastroesophageal reflux disease)   . High cholesterol   . Hyperlipemia   . Hypertension   . Hypothyroidism 09/08/2013  . IBS (irritable bowel syndrome)   . Internal hemorrhoids   . Joint pain 08/08/2016  . Low back pain 08/10/2016  . OSA (obstructive sleep  apnea)   . Plantar fasciitis   . Sun-damaged skin 12/09/2016  . Vitamin D deficiency 03/04/2016    Past Surgical History:  Procedure Laterality Date  . APPENDECTOMY     age 57  . CATARACT EXTRACTION Bilateral 2016   Dr. Bing Plume  . CATARACT EXTRACTION, BILATERAL     12/8 and 12/14  . COLONOSCOPY  06/03/2010, 08/25/2011   diverticulosis   . GASTRECTOMY     partial - ulcer as a teen  . HAND SURGERY Right   . HEMORRHOID BANDING    . KNEE ARTHROSCOPY Left 1998  . NASAL SEPTUM SURGERY    . RHINOPLASTY    . TOE SURGERY Right 1995    Family History  Problem Relation Age of Onset  . Diabetes Mother        type II  . Heart attack Mother 11  . Stroke Mother   . Colon cancer Sister   . Asthma Maternal Grandmother   . Mental retardation Son        PTSD  . Parkinsonism Father   . Cancer Sister        stage 4 bladder cancer  . Colon polyps Sister   . Ulcers Unknown        bleeding gastric  . Heart defect Unknown        child  . Other Unknown        perforated bowel, child  . Amblyopia Neg Hx   . Blindness  Neg Hx   . Cataracts Neg Hx   . Glaucoma Neg Hx   . Macular degeneration Neg Hx   . Retinal detachment Neg Hx   . Strabismus Neg Hx   . Retinitis pigmentosa Neg Hx     Social History   Social History  . Marital status: Married    Spouse name: [name]  . Number of children: 4  . Years of education: N/A   Occupational History  . retired Orthoptist   Social History Main Topics  . Smoking status: Never Smoker  . Smokeless tobacco: Never Used  . Alcohol use No  . Drug use: No  . Sexual activity: No     Comment: lives with her mother, no dietary restrictions   Other Topics Concern  . Not on file   Social History Narrative   No caffeine drinks daily     Outpatient Medications Prior to Visit  Medication Sig Dispense Refill  . ACCU-CHEK SOFTCLIX LANCETS lancets Use as directed twice daily to check blood sugar. DX E11.9 200 each 3  . Alcohol Swabs (B-D SINGLE USE  SWABS REGULAR) PADS Use as directed to check blood sugar. DX E11.9 100 each 3  . atorvastatin (LIPITOR) 80 MG tablet TAKE 1 TABLET EVERY DAY 90 tablet 2  . Blood Glucose Calibration (ACCU-CHEK AVIVA) SOLN Use as directed to check blood sugar.  DX E11.9 1 each 3  . Blood Glucose Monitoring Suppl (ACCU-CHEK AVIVA PLUS) w/Device KIT Use as directed to check blood sugar.  DX E11.9 1 kit 0  . Cholecalciferol (VITAMIN D3) 2000 units TABS Take 1 tablet by mouth daily.    Marland Kitchen donepezil (ARICEPT) 5 MG tablet Take 1 tablet (5 mg total) by mouth at bedtime. 30 tablet 2  . empagliflozin (JARDIANCE) 10 MG TABS tablet Take 10 mg by mouth daily. 30 tablet 5  . escitalopram (LEXAPRO) 20 MG tablet Take 1 tablet (20 mg total) by mouth daily. 90 tablet 1  . famciclovir (FAMVIR) 500 MG tablet 1000 mg po bid x 24 hours then 250 mg po bid every day 35 tablet 3  . fenofibrate 160 MG tablet Take 1 tablet (160 mg total) by mouth daily. (Patient taking differently: Take 160 mg by mouth at bedtime. ) 90 tablet 2  . glimepiride (AMARYL) 4 MG tablet Take 1 tablet (4 mg total) by mouth 2 (two) times daily. 180 tablet 1  . glucose blood (ACCU-CHEK AVIVA PLUS) test strip Use as directed twice daily to check blood sugar.  DX E11.9 200 each 3  . levothyroxine (SYNTHROID, LEVOTHROID) 25 MCG tablet Take 1 tablet (25 mcg total) by mouth daily before breakfast. 90 tablet 2  . losartan (COZAAR) 50 MG tablet Take 1 tablet (50 mg total) by mouth daily. 90 tablet 1  . Multiple Vitamins-Minerals (CENTRUM SILVER PO) Take 1 tablet by mouth daily.      . pantoprazole (PROTONIX) 40 MG tablet TAKE 1 TABLET (40 MG TOTAL) BY MOUTH DAILY. 90 tablet 0  . prednisoLONE acetate (PRED FORTE) 1 % ophthalmic suspension   0  . Probiotic Product (ALIGN) 4 MG CAPS Take 1 capsule by mouth daily. Reported on 07/02/2015    . valACYclovir (VALTREX) 1000 MG tablet   6  . VENTOLIN HFA 108 (90 Base) MCG/ACT inhaler INHALE 2 PUFFS BY MOUTH INTO THE LUNGS EVERY 4  (FOUR) HOURS AS NEEDED FOR SHORTNESS OF BREATH AND WHEEZING 18 g 0   No facility-administered medications prior to visit.  Allergies  Allergen Reactions  . Compazine [Prochlorperazine Edisylate]     Comma for 3 days  . Tetanus Toxoid Other (See Comments)    convulsions  . Sulfa Antibiotics Other (See Comments)    Unknown    Review of Systems  Constitutional: Negative for fever and malaise/fatigue.  HENT: Negative for congestion.   Eyes: Negative for blurred vision.  Respiratory: Negative for cough and shortness of breath.   Cardiovascular: Negative for chest pain, palpitations and leg swelling.  Gastrointestinal: Negative for vomiting.  Genitourinary: Positive for hematuria.  Musculoskeletal: Positive for myalgias. Negative for back pain.  Skin: Negative for rash.  Neurological: Positive for sensory change. Negative for loss of consciousness and headaches.       Objective:    Physical Exam  Constitutional: She is oriented to person, place, and time. She appears well-developed and well-nourished. No distress.  HENT:  Head: Normocephalic and atraumatic.  Eyes: Conjunctivae are normal.  Neck: Normal range of motion. No thyromegaly present.  Cardiovascular: Normal rate and regular rhythm.   Pulmonary/Chest: Effort normal and breath sounds normal. She has no wheezes.  Abdominal: Soft. Bowel sounds are normal. There is no tenderness.  Musculoskeletal: Normal range of motion. She exhibits no edema or deformity.  Neurological: She is alert and oriented to person, place, and time.  Skin: Skin is warm and dry. She is not diaphoretic.  Psychiatric: She has a normal mood and affect.    BP 134/78   Pulse 66   Temp 97.8 F (36.6 C) (Oral)   Resp 18   Wt 155 lb 12.8 oz (70.7 kg)   SpO2 97%   BMI 28.50 kg/m  Wt Readings from Last 3 Encounters:  05/07/17 155 lb 12.8 oz (70.7 kg)  04/02/17 156 lb (70.8 kg)  03/13/17 155 lb 3.2 oz (70.4 kg)   BP Readings from Last 3  Encounters:  05/10/17 134/78  04/02/17 140/78  03/13/17 138/90     Immunization History  Administered Date(s) Administered  . Influenza Split 05/26/2011, 03/31/2012  . Influenza Whole 03/27/2010  . Influenza, High Dose Seasonal PF 04/02/2017  . Influenza,inj,Quad PF,6+ Mos 03/29/2013, 04/13/2014, 04/02/2015, 04/23/2016  . Pneumococcal Conjugate-13 04/25/2013  . Pneumococcal Polysaccharide-23 07/14/2004, 08/03/2015  . Zoster 12/27/2013    Health Maintenance  Topic Date Due  . OPHTHALMOLOGY EXAM  05/06/2016  . HEMOGLOBIN A1C  09/10/2017  . FOOT EXAM  10/06/2017  . MAMMOGRAM  04/14/2018  . COLONOSCOPY  05/19/2021  . TETANUS/TDAP  09/10/2021  . INFLUENZA VACCINE  Completed  . DEXA SCAN  Completed  . Hepatitis C Screening  Completed  . PNA vac Low Risk Adult  Completed    Lab Results  Component Value Date   WBC 5.8 03/13/2017   HGB 13.5 03/13/2017   HCT 41.0 03/13/2017   PLT 272.0 03/13/2017   GLUCOSE 210 (H) 03/13/2017   CHOL 116 03/13/2017   TRIG 64.0 03/13/2017   HDL 43.70 03/13/2017   LDLDIRECT 123.0 09/18/2014   LDLCALC 60 03/13/2017   ALT 23 03/13/2017   AST 17 03/13/2017   NA 139 03/13/2017   K 4.4 03/13/2017   CL 105 03/13/2017   CREATININE 0.71 03/13/2017   BUN 12 03/13/2017   CO2 28 03/13/2017   TSH 3.27 03/13/2017   HGBA1C 7.3 (H) 03/13/2017   MICROALBUR 1.3 04/30/2015    Lab Results  Component Value Date   TSH 3.27 03/13/2017   Lab Results  Component Value Date   WBC 5.8 03/13/2017  HGB 13.5 03/13/2017   HCT 41.0 03/13/2017   MCV 98.0 03/13/2017   PLT 272.0 03/13/2017   Lab Results  Component Value Date   NA 139 03/13/2017   K 4.4 03/13/2017   CO2 28 03/13/2017   GLUCOSE 210 (H) 03/13/2017   BUN 12 03/13/2017   CREATININE 0.71 03/13/2017   BILITOT 0.4 03/13/2017   ALKPHOS 41 03/13/2017   AST 17 03/13/2017   ALT 23 03/13/2017   PROT 6.8 03/13/2017   ALBUMIN 4.2 03/13/2017   CALCIUM 9.6 03/13/2017   ANIONGAP 7 01/08/2015   GFR  87.04 03/13/2017   Lab Results  Component Value Date   CHOL 116 03/13/2017   Lab Results  Component Value Date   HDL 43.70 03/13/2017   Lab Results  Component Value Date   LDLCALC 60 03/13/2017   Lab Results  Component Value Date   TRIG 64.0 03/13/2017   Lab Results  Component Value Date   CHOLHDL 3 03/13/2017   Lab Results  Component Value Date   HGBA1C 7.3 (H) 03/13/2017         Assessment & Plan:   Problem List Items Addressed This Visit    DM (diabetes mellitus), type 2 with neurological complications ([place])    Sugars continue to fluctuate low of 115 and hi of 270 in past 2 weeks. No changes for now       Relevant Orders   Hemoglobin A1c   Hyperlipidemia    Tolerating statin, encouraged heart healthy diet, avoid trans fats, minimize simple carbs and saturated fats. Increase exercise as tolerated      Relevant Orders   Lipid panel   Essential hypertension - Primary    Improved on recheck no changes to meds. Encouraged heart healthy diet such as the DASH diet and exercise as tolerated.       Relevant Orders   CBC   Comprehensive metabolic panel   TSH   Hypothyroidism    On Levothyroxine, continue to monitor      Vitamin D deficiency    Take a daily supplement      Relevant Orders   VITAMIN D 25 Hydroxy (Vit-D Deficiency, Fractures)    Other Visit Diagnoses    Plantar wart of right foot       Relevant Orders   Ambulatory referral to Podiatry      I am having Ms. Forlenza start on fluticasone, cetirizine, and montelukast. I am also having her maintain her Multiple Vitamins-Minerals (CENTRUM SILVER PO), ALIGN, levothyroxine, fenofibrate, ACCU-CHEK AVIVA PLUS, ACCU-CHEK AVIVA, B-D SINGLE USE SWABS REGULAR, glucose blood, VENTOLIN HFA, Vitamin D3, famciclovir, valACYclovir, glimepiride, losartan, ACCU-CHEK SOFTCLIX LANCETS, empagliflozin, pantoprazole, atorvastatin, donepezil, escitalopram, and prednisoLONE acetate.  Meds ordered this encounter    Medications  . fluticasone (FLONASE) 50 MCG/ACT nasal spray    Sig: Place 2 sprays into both nostrils daily.    Dispense:  16 g    Refill:  3  . cetirizine (ZYRTEC) 10 MG tablet    Sig: Take 1 tablet (10 mg total) by mouth daily as needed for allergies.    Dispense:  30 tablet    Refill:  11  . montelukast (SINGULAIR) 10 MG tablet    Sig: Take 1 tablet (10 mg total) by mouth at bedtime as needed (allergies).    Dispense:  30 tablet    Refill:  3    CMA served as scribe during this visit. History, Physical and Plan performed by medical provider. Documentation and orders reviewed  and attested to.  Penni Homans, MD

## 2017-05-07 NOTE — Assessment & Plan Note (Signed)
On Levothyroxine, continue to monitor 

## 2017-05-07 NOTE — Assessment & Plan Note (Signed)
Sugars continue to fluctuate low of 115 and hi of 270 in past 2 weeks. No changes for now

## 2017-05-07 NOTE — Patient Instructions (Signed)

## 2017-05-07 NOTE — Assessment & Plan Note (Signed)
Tolerating statin, encouraged heart healthy diet, avoid trans fats, minimize simple carbs and saturated fats. Increase exercise as tolerated 

## 2017-05-07 NOTE — Assessment & Plan Note (Signed)
Take a daily supplement 

## 2017-05-10 NOTE — Assessment & Plan Note (Signed)
Improved on recheck. no changes to meds. Encouraged heart healthy diet such as the DASH diet and exercise as tolerated.  

## 2017-05-11 NOTE — Telephone Encounter (Signed)
Certified dismissal letter returned as undeliverable, unclaimed, return to sender after three attempts by [employer] on May 11, 2017. Letter placed in another envelope and resent as 1st class mail which does not require a signature. [email or web address]

## 2017-05-15 ENCOUNTER — Ambulatory Visit (INDEPENDENT_AMBULATORY_CARE_PROVIDER_SITE_OTHER): Payer: Medicare HMO | Admitting: Podiatry

## 2017-05-15 DIAGNOSIS — Q828 Other specified congenital malformations of skin: Secondary | ICD-10-CM

## 2017-05-20 NOTE — Progress Notes (Signed)
Subjective: Sheleen presents the office today for concerns of thickening calluses to both of her feet with the right side worse to the left. This has been getting worse over last month and is painful to walk on it. She denies any recent injury or trauma. She denies any swelling or redness. She has no other concerns today. She states that she does not want surgery.  Denies any systemic complaints such as fevers, chills, nausea, vomiting. No acute changes since last appointment, and no other complaints at this time.   Objective: AAO x3, NAD DP/PT pulses palpable bilaterally, CRT less than 3 seconds On the right foot submetatarsal 2 and 37 hyperkeratotic lesion as well as the right foot submetatarsal times one. Upon debridement there is no underlying ulceration, drainage or any clinical signs of infection noted today. There is no other open lesions or pre-ulcerative lesions there is no other areas of tenderness identified today. No pain with calf compression, swelling, warmth, erythema  Assessment: Hyperkeratotic lesions bilaterally  Plan: -All treatment options discussed with the patient including all alternatives, risks, complications.  -Lesions were sharply debrided 3 without any complications or bleeding. Offloading pads were dispensed. Discussed shoe shoegear medications inserts, accommodated orthotic. -Patient encouraged to call the office with any questions, concerns, change in symptoms.   Celesta Gentile, DPM

## 2017-05-26 ENCOUNTER — Ambulatory Visit: Payer: Medicare HMO | Admitting: Orthotics

## 2017-05-26 DIAGNOSIS — Q828 Other specified congenital malformations of skin: Secondary | ICD-10-CM

## 2017-05-26 DIAGNOSIS — M79672 Pain in left foot: Secondary | ICD-10-CM

## 2017-05-26 DIAGNOSIS — M79671 Pain in right foot: Secondary | ICD-10-CM

## 2017-05-26 DIAGNOSIS — M21961 Unspecified acquired deformity of right lower leg: Secondary | ICD-10-CM

## 2017-05-26 DIAGNOSIS — M7741 Metatarsalgia, right foot: Secondary | ICD-10-CM

## 2017-05-26 DIAGNOSIS — M205X1 Other deformities of toe(s) (acquired), right foot: Secondary | ICD-10-CM

## 2017-05-26 DIAGNOSIS — M216X9 Other acquired deformities of unspecified foot: Secondary | ICD-10-CM

## 2017-05-26 NOTE — Progress Notes (Signed)

## 2017-05-29 ENCOUNTER — Telehealth: Payer: Self-pay | Admitting: *Deleted

## 2017-05-29 MED FILL — DONEPEZIL HCL 5 MG TABLET: 5 | 30 days supply | Qty: 30 | Fill #1

## 2017-05-29 MED FILL — JARDIANCE 10 MG TABLET: 10 | 30 days supply | Qty: 30 | Fill #1

## 2017-05-29 NOTE — Telephone Encounter (Signed)
Received Physician Orders for Therapeutic shoes from [facility]; forwarded to provider/SLS 11/16

## 2017-06-10 DIAGNOSIS — R69 Illness, unspecified: Secondary | ICD-10-CM | POA: Diagnosis not present

## 2017-06-11 ENCOUNTER — Ambulatory Visit (INDEPENDENT_AMBULATORY_CARE_PROVIDER_SITE_OTHER): Payer: Medicare HMO | Admitting: Podiatry

## 2017-06-11 DIAGNOSIS — Q828 Other specified congenital malformations of skin: Secondary | ICD-10-CM

## 2017-06-11 DIAGNOSIS — M2041 Other hammer toe(s) (acquired), right foot: Secondary | ICD-10-CM

## 2017-06-11 DIAGNOSIS — E1149 Type 2 diabetes mellitus with other diabetic neurological complication: Secondary | ICD-10-CM

## 2017-06-12 NOTE — Progress Notes (Signed)
Subjective: Crystal [name] presents the office today for concerns of thickening calluses to both of her feet with the right side worse to the left.  She is not having much significant callus on the left side of the right side is starting to come back faster.  Denies any skin breakdown denies any redness or drainage or any swelling.  She also has concerns of her right second toe becoming painful and burns at times and she points to the PIPJ where she gets the majority of symptoms.  She is asking there is anything that can be done to help straighten this toe is also started to curl.  She has tried pads and shoe changes.  She has no other concerns today no acute changes since last appointment.  She is still awaiting her diabetic shoes.  Denies any systemic complaints such as fevers, chills, nausea, vomiting. No acute changes since last appointment, and no other complaints at this time.   Objective: AAO x3, NAD DP/PT pulses palpable bilaterally, CRT less than 3 seconds Sensation decreased with Simms Weinstein monofilament On the right foot submetatarsal 2 and 3 there are hyperkeratotic lesions. Upon debridement there is no underlying ulceration, drainage or any clinical signs of infection noted today. There is no other open lesions or pre-ulcerative lesions there is no other areas of tenderness identified today. Hammertoe contracture which is rigid to the PIPJ on the right second toe with tenderness to the PIPJ.  There is no skin breakdown, edema, erythema, increase in warmth.  Mild hammertoe contracture as well as the left second digit. No pain with calf compression, swelling, warmth, erythema  Assessment: Hyperkeratotic lesions bilaterally; hammertoe right second toe  Plan: -All treatment options discussed with the patient including all alternatives, risks, complications.  -Lesions were sharply debrided 2 without any complications or bleeding. Offloading pads were dispensed. -Awaiting shoes,  inserts. -Discussed for hammertoe repair, surgery to the right second toe.  We discussed the surgery as well as the postoperative course and she will consider her options.  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 -Patient encouraged to call the office with any questions, concerns, change in symptoms.   Celesta Gentile, DPM

## 2017-06-15 ENCOUNTER — Encounter: Payer: Self-pay | Admitting: Internal Medicine

## 2017-06-15 ENCOUNTER — Ambulatory Visit: Payer: Medicare HMO | Admitting: Internal Medicine

## 2017-06-15 VITALS — BP 138/82 | HR 73 | Wt 155.6 lb

## 2017-06-15 DIAGNOSIS — E785 Hyperlipidemia, unspecified: Secondary | ICD-10-CM

## 2017-06-15 DIAGNOSIS — R200 Anesthesia of skin: Secondary | ICD-10-CM | POA: Diagnosis not present

## 2017-06-15 DIAGNOSIS — E1149 Type 2 diabetes mellitus with other diabetic neurological complication: Secondary | ICD-10-CM | POA: Diagnosis not present

## 2017-06-15 LAB — POCT GLYCOSYLATED HEMOGLOBIN (HGB A1C): HEMOGLOBIN A1C: 7.1

## 2017-06-15 LAB — VITAMIN B12: VITAMIN B 12: 484 pg/mL (ref 211–911)

## 2017-06-15 NOTE — Patient Instructions (Addendum)
Please continue: - Glimepiride 4 mg 1x a day before breakfast - Jardiance 10 mg daily before breakfast  Please return in 3 months with your sugar log.   Can try the following combination for neuropathy: - alpha-lipoic acid 600 mg 2x a day - B complex 1 tablet a day  Please stop at the lab.

## 2017-06-15 NOTE — Progress Notes (Signed)
Patient ID: Crystal [name], female   DOB: 01/10/1951, 66 y.o.   MRN: [id number]   HPI: Crystal [name] is a 66 y.o.-year-old female, returning for follow-up for DM2, dx in 2003, non-insulin-dependent, uncontrolled, with complications (PN, DR).  Last visit 2.5 months ago.  Last hemoglobin A1c was: Lab Results  Component Value Date   HGBA1C 7.3 (H) 03/13/2017   HGBA1C 7.0 (H) 11/28/2016   HGBA1C 6.9 (H) 10/06/2016   Pt is on a regimen of: - Amaryl 4 mg 2x a day before meals  (is only taking it once a day in am) - Jardiance 10 mg daily before breakfast - added 03/2017 - now pays 47$. Metformin gave her diarrhea in the past. Januvia was too expensive. Invokana >> cannot remember why stopped  Pt checks her sugars 1x a day: - am: 120-125 >> 57, 105-145, 157 - 2h after b'fast: 199 >> n/c - before lunch: n/c - 2h after lunch: 190-240s, 275 >> 127-135 - before dinner: n/c - 2h after dinner: n/c - bedtime: 115-140  >> 135-157 - nighttime: n/c Lowest sugar was 55 >> 57; she has hypoglycemia awareness at 80s.  Highest sugar was 320 >> 157.  Glucometer: AccuChek [employer]iva  Pt's meals are: - Breakfast: bowl of cereals or bacon + egg + toast or egg sandwich or biscuit + gravy + hash browns; diet drinks - Lunch: leftovers from dinner or tuna + salad dressing + tuna; box of mac and cheese + tuna; raman noodles; cheese + fruit - Dinner: may eat out (chinese)   - No CKD, last BUN/creatinine:  Lab Results  Component Value Date   BUN 12 03/13/2017   BUN 17 11/28/2016   CREATININE 0.71 03/13/2017   CREATININE 0.77 11/28/2016  On losartan 50. -+ HL; last set of lipids: Lab Results  Component Value Date   CHOL 116 03/13/2017   HDL 43.70 03/13/2017   LDLCALC 60 03/13/2017   LDLDIRECT 123.0 09/18/2014   TRIG 64.0 03/13/2017   CHOLHDL 3 03/13/2017  On Lipitor 80, fenofibrate 160. - last eye exam was in 2018 >> + DR. Sees retina specialist: Dr. Zamora. - No numbness and tingling in  her feet. + numbness in fingertips.   Pt also has HL, mild cognitive impairment .  ROS: Constitutional: no weight gain/no weight loss, no fatigue, no subjective hyperthermia, no subjective hypothermia Eyes: no blurry vision, no xerophthalmia ENT: no sore throat, no nodules palpated in throat, no dysphagia, no odynophagia, no hoarseness Cardiovascular: no CP/no SOB/no palpitations/no leg swelling Respiratory: no cough/no SOB/no wheezing Gastrointestinal: no N/no V/+ D/no C/no acid reflux Musculoskeletal: no muscle aches/+ joint aches Skin: no rashes, no hair loss Neurological: no tremors/no numbness/no tingling/no dizziness  I reviewed pt's medications, allergies, PMH, social hx, family hx, and changes were documented in the history of present illness. Otherwise, unchanged from my initial visit note.  Past Medical History:  Diagnosis Date  . Acute peptic ulcer of stomach    As a teenager  . Allergy    dust, cock roach,grass,weeds,molds  . Anxiety   . Asthma    02/08/10 FEV1 1.98 (80%), s/p saba 2.22 l/m (90%).nml  . Cognitive change 06/10/2016  . Depression   . Diabetes mellitus 2003   Type II  . Diverticulosis   . Dyspepsia   . Fatty liver 12/19/2010  . GERD (gastroesophageal reflux disease)   . High cholesterol   . Hyperlipemia   . Hypertension   . Hypothyroidism 09/08/2013  . IBS (irritable   bowel syndrome)   . Internal hemorrhoids   . Joint pain 08/08/2016  . Low back pain 08/10/2016  . OSA (obstructive sleep apnea)   . Plantar fasciitis   . Sun-damaged skin 12/09/2016  . Vitamin D deficiency 03/04/2016   Past Surgical History:  Procedure Laterality Date  . APPENDECTOMY     age 17  . CATARACT EXTRACTION Bilateral 2016   Dr. Digby  . CATARACT EXTRACTION, BILATERAL     12/8 and 12/14  . COLONOSCOPY  06/03/2010, 08/25/2011   diverticulosis   . GASTRECTOMY     partial - ulcer as a teen  . HAND SURGERY Right   . HEMORRHOID BANDING    . KNEE ARTHROSCOPY Left 1998  .  NASAL SEPTUM SURGERY    . RHINOPLASTY    . TOE SURGERY Right 1995   Social History   Social History  . Marital status: widowed    Spouse name: Lenny  . Number of children: 4  . Years of education: N/A   Occupational History  . retired [employer]   Social History Main Topics  . Smoking status: Never Smoker  . Smokeless tobacco: Never Used  . Alcohol use No  . Drug use: No  . Sexual activity: No     Comment: lives with her mother, no dietary restrictions   Other Topics Concern  . Not on file   Social History Narrative   No caffeine drinks daily    Current Outpatient Medications on File Prior to Visit  Medication Sig Dispense Refill  . ACCU-CHEK SOFTCLIX LANCETS lancets Use as directed twice daily to check blood sugar. DX E11.9 200 each 3  . Alcohol Swabs (B-D SINGLE USE SWABS REGULAR) PADS Use as directed to check blood sugar. DX E11.9 100 each 3  . atorvastatin (LIPITOR) 80 MG tablet TAKE 1 TABLET EVERY DAY 90 tablet 2  . Blood Glucose Calibration (ACCU-CHEK AVIVA) SOLN Use as directed to check blood sugar.  DX E11.9 1 each 3  . Blood Glucose Monitoring Suppl (ACCU-CHEK AVIVA PLUS) w/Device KIT Use as directed to check blood sugar.  DX E11.9 1 kit 0  . cetirizine (ZYRTEC) 10 MG tablet Take 1 tablet (10 mg total) by mouth daily as needed for allergies. 30 tablet 11  . Cholecalciferol (VITAMIN D3) 2000 units TABS Take 1 tablet by mouth daily.    . donepezil (ARICEPT) 5 MG tablet Take 1 tablet (5 mg total) by mouth at bedtime. 30 tablet 2  . empagliflozin (JARDIANCE) 10 MG TABS tablet Take 10 mg by mouth daily. 30 tablet 5  . escitalopram (LEXAPRO) 20 MG tablet Take 1 tablet (20 mg total) by mouth daily. 90 tablet 1  . famciclovir (FAMVIR) 500 MG tablet 1000 mg po bid x 24 hours then 250 mg po bid every day 35 tablet 3  . fenofibrate 160 MG tablet Take 1 tablet (160 mg total) by mouth daily. (Patient taking differently: Take 160 mg by mouth at bedtime. ) 90 tablet 2  . fluticasone  (FLONASE) 50 MCG/ACT nasal spray Place 2 sprays into both nostrils daily. 16 g 3  . glimepiride (AMARYL) 4 MG tablet Take 1 tablet (4 mg total) by mouth 2 (two) times daily. 180 tablet 1  . glucose blood (ACCU-CHEK AVIVA PLUS) test strip Use as directed twice daily to check blood sugar.  DX E11.9 200 each 3  . levothyroxine (SYNTHROID, LEVOTHROID) 25 MCG tablet Take 1 tablet (25 mcg total) by mouth daily before breakfast. 90 tablet 2  .   losartan (COZAAR) 50 MG tablet Take 1 tablet (50 mg total) by mouth daily. 90 tablet 1  . montelukast (SINGULAIR) 10 MG tablet Take 1 tablet (10 mg total) by mouth at bedtime as needed (allergies). 30 tablet 3  . Multiple Vitamins-Minerals (CENTRUM SILVER PO) Take 1 tablet by mouth daily.      . pantoprazole (PROTONIX) 40 MG tablet TAKE 1 TABLET (40 MG TOTAL) BY MOUTH DAILY. 90 tablet 0  . prednisoLONE acetate (PRED FORTE) 1 % ophthalmic suspension   0  . Probiotic Product (ALIGN) 4 MG CAPS Take 1 capsule by mouth daily. Reported on 07/02/2015    . valACYclovir (VALTREX) 1000 MG tablet   6  . VENTOLIN HFA 108 (90 Base) MCG/ACT inhaler INHALE 2 PUFFS BY MOUTH INTO THE LUNGS EVERY 4 (FOUR) HOURS AS NEEDED FOR SHORTNESS OF BREATH AND WHEEZING 18 g 0   No current facility-administered medications on file prior to visit.    Allergies  Allergen Reactions  . Compazine [Prochlorperazine Edisylate]     Comma for 3 days  . Tetanus Toxoid Other (See Comments)    convulsions  . Sulfa Antibiotics Other (See Comments)    Unknown   Family History  Problem Relation Age of Onset  . Diabetes Mother        type II  . Heart attack Mother 50  . Stroke Mother   . Colon cancer Sister   . Asthma Maternal Grandmother   . Mental retardation Son        PTSD  . Parkinsonism Father   . Cancer Sister        stage 4 bladder cancer  . Colon polyps Sister   . Ulcers Unknown        bleeding gastric  . Heart defect Unknown        child  . Other Unknown        perforated  bowel, child  . Amblyopia Neg Hx   . Blindness Neg Hx   . Cataracts Neg Hx   . Glaucoma Neg Hx   . Macular degeneration Neg Hx   . Retinal detachment Neg Hx   . Strabismus Neg Hx   . Retinitis pigmentosa Neg Hx    Pt has FH of DM in mother.  PE: BP 138/82   Pulse 73   Wt 155 lb 9.6 oz (70.6 kg)   SpO2 98%   BMI 28.46 kg/m  Wt Readings from Last 3 Encounters:  06/15/17 155 lb 9.6 oz (70.6 kg)  05/07/17 155 lb 12.8 oz (70.7 kg)  04/02/17 156 lb (70.8 kg)   Constitutional: overweight, in NAD Eyes: + R surgical pupil, EOMI, no exophthalmos ENT: moist mucous membranes, no thyromegaly, no cervical lymphadenopathy Cardiovascular: RRR, No MRG Respiratory: CTA B Gastrointestinal: abdomen soft, NT, ND, BS+ Musculoskeletal: no deformities, strength intact in all 4 Skin: moist, warm, no rashes Neurological: + tremor with outstretched hands, DTR normal in all 4  ASSESSMENT: 1. DM2, non-insulin-dependent, uncontrolled, with complications - PN - DR  She saw nutrition in the past: "I know what I need to do, but I just cannot do it"  We are limited in our options for treatment by the cost of the medicines.  At last visit, we called her pharmacy with patient in the room and almost all the medicines that we could use were more than $100 per month month for her...  2. HL  PLAN:  1. Patient with long-standing, uncontrolled diabetes, on oral antidiabetic regimen with glimepiride,   to which we added Jardiance at last visit, as her sugars were especially higher after meals.  We also moved glimepiride before meals, since in the past she was taking it without a relationship to the meals.  At that time, we also discussed about healthier options for her diet. - her sugars are better at this visit despite only taking Amaryl in am >> will keep this only once a day as she had 1 low CBG in am and her sugars at bedtime are not higher than target - I advised her to: Patient Instructions  Please  continue: - Glimepiride 4 mg 1x a day before breakfast - Jardiance 10 mg daily before breakfast  Please return in 3 months with your sugar log.   Can try the following combination for neuropathy: - alpha-lipoic acid 600 mg 2x a day - B complex 1 tablet a day  Please stop at the lab.  - today, HbA1c is 7.1% (better) - continue checking sugars at different times of the day - check 1x a day, rotating checks - advised for yearly eye exams >> she is UTD - she had the flu shot this season  - Return to clinic in 3 mo with sugar log   2. HL - Reviewed latest lipid panel >> LDL much improved since 2016, and triglycerides are normal - Continue Lipitor 80 mg daily and also fenofibrate. No SEs  3. Numbness in fingers - we will check a B12 level:  if low >> replete; if normal >> start ALA + B complex (see Pt instr.)  Office Visit on 06/15/2017  Component Date Value Ref Range Status  . Vitamin B-12 06/15/2017 484  211 - 911 pg/mL Final  . Hemoglobin A1C 06/15/2017 7.1   Final   Msg sent: Dear Ms. Bolte, Your vitamin B-12 level is normal.  At this point, we can go ahead with a plan to start alpha lipoic acid and B complex, as discussed. Sincerely,   MD  , MD PhD [facility]   

## 2017-06-17 ENCOUNTER — Ambulatory Visit (INDEPENDENT_AMBULATORY_CARE_PROVIDER_SITE_OTHER): Payer: Medicare HMO | Admitting: Family Medicine

## 2017-06-17 ENCOUNTER — Encounter: Payer: Self-pay | Admitting: Family Medicine

## 2017-06-17 DIAGNOSIS — M25562 Pain in left knee: Secondary | ICD-10-CM | POA: Diagnosis not present

## 2017-06-17 MED ORDER — METHYLPREDNISOLONE ACETATE 40 MG/ML IJ SUSP
40.0000 mg | Freq: Once | INTRAMUSCULAR | Status: AC
  Administered 2017-06-17: 40 mg via INTRA_ARTICULAR

## 2017-06-17 NOTE — Patient Instructions (Signed)
Your pain is due to arthritis. These are the different medications you can take for this: Tylenol 500mg 1-2 tabs three times a day for pain. Capsaicin, aspercreme, or biofreeze topically up to four times a day may also help with pain. Some supplements that may help for arthritis: Boswellia extract, curcumin, pycnogenol Aleve 1-2 tabs twice a day with food Cortisone injections are an option - you were given this today If cortisone injections do not help, there are different types of shots that may help but they take longer to take effect. It's important that you continue to stay active. Straight leg raises, knee extensions 3 sets of 10 once a day (add ankle weight if these become too easy). Consider physical therapy to strengthen muscles around the joint that hurts to take pressure off of the joint itself. Shoe inserts with good arch support may be helpful. Heat or ice 15 minutes at a time 3-4 times a day as needed to help with pain. Water aerobics and cycling with low resistance are the best two types of exercise for arthritis though any exercise is ok as long as it doesn't worsen the pain. Follow up with me in 1 month.  

## 2017-06-18 ENCOUNTER — Other Ambulatory Visit: Payer: Self-pay | Admitting: Family Medicine

## 2017-06-18 ENCOUNTER — Encounter: Payer: Self-pay | Admitting: Family Medicine

## 2017-06-18 DIAGNOSIS — M25562 Pain in left knee: Secondary | ICD-10-CM | POA: Insufficient documentation

## 2017-06-18 NOTE — Progress Notes (Signed)
PCP: [name], MD  Subjective:   HPI: Patient is a 66 y.o. female here for left knee pain.  We saw patient previously for her right knee this year - arthritis in addition to a meniscus tear. However she reports over past 2-3 weeks she's had severely worsening left knee pain with throbbing. Pain level 6/10 and medial. Right knee just started hurting yesterday but not as bad at a 3/10 level. Has been taking aspirin, using icy hot, biofreeze, and patches. No skin changes, numbness.  Past Medical History:  Diagnosis Date  . Acute peptic ulcer of stomach    As a teenager  . Allergy    dust, cock roach,grass,weeds,molds  . Anxiety   . Asthma    02/08/10 FEV1 1.98 (80%), s/p saba 2.22 l/m (90%).nml  . Cognitive change 06/10/2016  . Depression   . Diabetes mellitus 2003   Type II  . Diverticulosis   . Dyspepsia   . Fatty liver 12/19/2010  . GERD (gastroesophageal reflux disease)   . High cholesterol   . Hyperlipemia   . Hypertension   . Hypothyroidism 09/08/2013  . IBS (irritable bowel syndrome)   . Internal hemorrhoids   . Joint pain 08/08/2016  . Low back pain 08/10/2016  . OSA (obstructive sleep apnea)   . Plantar fasciitis   . Sun-damaged skin 12/09/2016  . Vitamin D deficiency 03/04/2016    Current Outpatient Medications on File Prior to Visit  Medication Sig Dispense Refill  . ACCU-CHEK SOFTCLIX LANCETS lancets Use as directed twice daily to check blood sugar. DX E11.9 200 each 3  . Alcohol Swabs (B-D SINGLE USE SWABS REGULAR) PADS Use as directed to check blood sugar. DX E11.9 100 each 3  . atorvastatin (LIPITOR) 80 MG tablet TAKE 1 TABLET EVERY DAY 90 tablet 2  . Blood Glucose Calibration (ACCU-CHEK AVIVA) SOLN Use as directed to check blood sugar.  DX E11.9 1 each 3  . Blood Glucose Monitoring Suppl (ACCU-CHEK AVIVA PLUS) w/Device KIT Use as directed to check blood sugar.  DX E11.9 1 kit 0  . cetirizine (ZYRTEC) 10 MG tablet Take 1 tablet (10 mg total) by mouth  daily as needed for allergies. 30 tablet 11  . Cholecalciferol (VITAMIN D3) 2000 units TABS Take 1 tablet by mouth daily.    Marland Kitchen donepezil (ARICEPT) 5 MG tablet Take 1 tablet (5 mg total) by mouth at bedtime. 30 tablet 2  . empagliflozin (JARDIANCE) 10 MG TABS tablet Take 10 mg by mouth daily. 30 tablet 5  . escitalopram (LEXAPRO) 20 MG tablet Take 1 tablet (20 mg total) by mouth daily. 90 tablet 1  . famciclovir (FAMVIR) 500 MG tablet 1000 mg po bid x 24 hours then 250 mg po bid every day 35 tablet 3  . fenofibrate 160 MG tablet Take 1 tablet (160 mg total) by mouth daily. (Patient taking differently: Take 160 mg by mouth at bedtime. ) 90 tablet 2  . fluticasone (FLONASE) 50 MCG/ACT nasal spray Place 2 sprays into both nostrils daily. 16 g 3  . glimepiride (AMARYL) 4 MG tablet Take 1 tablet (4 mg total) by mouth 2 (two) times daily. 180 tablet 1  . glucose blood (ACCU-CHEK AVIVA PLUS) test strip Use as directed twice daily to check blood sugar.  DX E11.9 200 each 3  . levothyroxine (SYNTHROID, LEVOTHROID) 25 MCG tablet Take 1 tablet (25 mcg total) by mouth daily before breakfast. 90 tablet 2  . losartan (COZAAR) 50 MG tablet Take 1  tablet (50 mg total) by mouth daily. 90 tablet 1  . montelukast (SINGULAIR) 10 MG tablet Take 1 tablet (10 mg total) by mouth at bedtime as needed (allergies). 30 tablet 3  . Multiple Vitamins-Minerals (CENTRUM SILVER PO) Take 1 tablet by mouth daily.      . pantoprazole (PROTONIX) 40 MG tablet TAKE 1 TABLET (40 MG TOTAL) BY MOUTH DAILY. 90 tablet 0  . prednisoLONE acetate (PRED FORTE) 1 % ophthalmic suspension   0  . Probiotic Product (ALIGN) 4 MG CAPS Take 1 capsule by mouth daily. Reported on 07/02/2015    . valACYclovir (VALTREX) 1000 MG tablet   6  . VENTOLIN HFA 108 (90 Base) MCG/ACT inhaler INHALE 2 PUFFS BY MOUTH INTO THE LUNGS EVERY 4 (FOUR) HOURS AS NEEDED FOR SHORTNESS OF BREATH AND WHEEZING 18 g 0   No current facility-administered medications on file prior  to visit.     Past Surgical History:  Procedure Laterality Date  . APPENDECTOMY     age 50  . CATARACT EXTRACTION Bilateral 2016   Dr. Bing Plume  . CATARACT EXTRACTION, BILATERAL     12/8 and 12/14  . COLONOSCOPY  06/03/2010, 08/25/2011   diverticulosis   . GASTRECTOMY     partial - ulcer as a teen  . HAND SURGERY Right   . HEMORRHOID BANDING    . KNEE ARTHROSCOPY Left 1998  . NASAL SEPTUM SURGERY    . RHINOPLASTY    . TOE SURGERY Right 1995    Allergies  Allergen Reactions  . Compazine [Prochlorperazine Edisylate]     Comma for 3 days  . Tetanus Toxoid Other (See Comments)    convulsions  . Sulfa Antibiotics Other (See Comments)    Unknown    Social History   Socioeconomic History  . Marital status: Married    Spouse name: [name]  . Number of children: 4  . Years of education: Not on file  . Highest education level: Not on file  Social Needs  . Financial resource strain: Not on file  . Food insecurity - worry: Not on file  . Food insecurity - inability: Not on file  . Transportation needs - medical: Not on file  . Transportation needs - non-medical: Not on file  Occupational History  . Occupation: retired    Fish farm manager: [employer]  Tobacco Use  . Smoking status: Never Smoker  . Smokeless tobacco: Never Used  Substance and Sexual Activity  . Alcohol use: No    Alcohol/week: 0.0 oz  . Drug use: No  . Sexual activity: No    Comment: lives with her mother, no dietary restrictions  Other Topics Concern  . Not on file  Social History Narrative   No caffeine drinks daily     Family History  Problem Relation Age of Onset  . Diabetes Mother        type II  . Heart attack Mother 54  . Stroke Mother   . Colon cancer Sister   . Asthma Maternal Grandmother   . Mental retardation Son        PTSD  . Parkinsonism Father   . Cancer Sister        stage 4 bladder cancer  . Colon polyps Sister   . Ulcers Unknown        bleeding gastric  . Heart defect Unknown         child  . Other Unknown        perforated bowel, child  .  Amblyopia Neg Hx   . Blindness Neg Hx   . Cataracts Neg Hx   . Glaucoma Neg Hx   . Macular degeneration Neg Hx   . Retinal detachment Neg Hx   . Strabismus Neg Hx   . Retinitis pigmentosa Neg Hx     Pulse 72   Ht '5\' 2"'  (1.575 m)   Wt 153 lb (69.4 kg)   BMI 27.98 kg/m   Review of Systems: See HPI above.     Objective:  Physical Exam:  Gen: NAD, comfortable in exam room  Left knee: No gross deformity, ecchymoses, effusion. TTP medial joint line.  No other tenderness. FROM. Negative ant/post drawers. Negative valgus/varus testing. Negative lachmanns. Negative mcmurrays, apleys, patellar apprehension. NV intact distally.  Right knee: No gross deformity, ecchymoses, swelling. No TTP. FROM. Negative ant/post drawers. Negative valgus/varus testing. Negative lachmanns. Negative mcmurrays, apleys, patellar apprehension. NV intact distally.   Assessment & Plan:  1. Left knee pain - 2/2 arthritis.  Exam is reassuring.  Discussed tylenol, topical medications, supplements, aleve.  Injection given today.  Ice/heat if needed.  Shown home exercises to do daily.  Consider physical therapy.  F/u in 1 month.  After informed written consent timeout was performed, patient was seated on exam table. Left knee was prepped with alcohol swab and utilizing anteromedial approach, patient's left knee was injected intraarticularly with 3:1 bupivicaine: depomedrol. Patient tolerated the procedure well without immediate complications.

## 2017-06-18 NOTE — Assessment & Plan Note (Signed)
2/2 arthritis.  Exam is reassuring.  Discussed tylenol, topical medications, supplements, aleve.  Injection given today.  Ice/heat if needed.  Shown home exercises to do daily.  Consider physical therapy.  F/u in 1 month.  After informed written consent timeout was performed, patient was seated on exam table. Left knee was prepped with alcohol swab and utilizing anteromedial approach, patient's left knee was injected intraarticularly with 3:1 bupivicaine: depomedrol. Patient tolerated the procedure well without immediate complications.

## 2017-06-22 ENCOUNTER — Ambulatory Visit: Payer: Medicare HMO | Admitting: Orthotics

## 2017-06-23 ENCOUNTER — Ambulatory Visit (INDEPENDENT_AMBULATORY_CARE_PROVIDER_SITE_OTHER): Payer: Medicare HMO | Admitting: Orthotics

## 2017-06-23 DIAGNOSIS — E1149 Type 2 diabetes mellitus with other diabetic neurological complication: Secondary | ICD-10-CM

## 2017-06-23 DIAGNOSIS — M205X1 Other deformities of toe(s) (acquired), right foot: Secondary | ICD-10-CM

## 2017-06-23 DIAGNOSIS — M216X2 Other acquired deformities of left foot: Secondary | ICD-10-CM | POA: Diagnosis not present

## 2017-06-23 DIAGNOSIS — M205X9 Other deformities of toe(s) (acquired), unspecified foot: Secondary | ICD-10-CM | POA: Diagnosis not present

## 2017-06-23 DIAGNOSIS — M2041 Other hammer toe(s) (acquired), right foot: Secondary | ICD-10-CM

## 2017-06-23 DIAGNOSIS — M21961 Unspecified acquired deformity of right lower leg: Secondary | ICD-10-CM

## 2017-06-23 DIAGNOSIS — Q828 Other specified congenital malformations of skin: Secondary | ICD-10-CM | POA: Diagnosis not present

## 2017-06-23 DIAGNOSIS — M2040 Other hammer toe(s) (acquired), unspecified foot: Secondary | ICD-10-CM | POA: Diagnosis not present

## 2017-06-24 ENCOUNTER — Telehealth: Payer: Self-pay

## 2017-06-24 ENCOUNTER — Telehealth: Payer: Self-pay | Admitting: Family Medicine

## 2017-06-24 NOTE — Telephone Encounter (Signed)
Copied from [place]. Topic: General - Other >> Jun 23, 2017  2:36 PM Conception Chancy, NT wrote: Reason for CRM: patient says she needs to discuss some things with Dr. Charlett Blake or her nurse about some things. That It was not urgent. Please contact patient when available. Pt is informed the office is closed and will reopen at 10 on 12/12  Please call patient and confirm her concerns.

## 2017-06-24 NOTE — Telephone Encounter (Signed)
Spoke with patient she states she is having parkinson's symptoms  Patient scheduled for 12/14 @ 11:30am  To discuss

## 2017-06-24 NOTE — Progress Notes (Signed)

## 2017-06-24 NOTE — Telephone Encounter (Signed)
Follow up call made to patient. States she has had shaking that hs increased. Would like to be evaluated for Parkinsons. Would also like referal to Neurology. Appointment scheduled for 06/26/17 @ 11:30.

## 2017-06-24 NOTE — Telephone Encounter (Signed)
Called patient and left message for patient to call the office back.       Copied from [place]. Topic: General - Other >> Jun 23, 2017  2:36 PM Conception Chancy, NT wrote: Reason for CRM: patient says she needs to discuss some things with Dr. Charlett Blake or her nurse about some things. That It was not urgent. Please contact patient when available. Pt is informed the office is closed and will reopen at 10 on 12/12

## 2017-06-25 DIAGNOSIS — R69 Illness, unspecified: Secondary | ICD-10-CM | POA: Diagnosis not present

## 2017-06-26 ENCOUNTER — Encounter: Payer: Self-pay | Admitting: Family Medicine

## 2017-06-26 ENCOUNTER — Ambulatory Visit (INDEPENDENT_AMBULATORY_CARE_PROVIDER_SITE_OTHER): Payer: Medicare HMO | Admitting: Family Medicine

## 2017-06-26 ENCOUNTER — Ambulatory Visit (HOSPITAL_BASED_OUTPATIENT_CLINIC_OR_DEPARTMENT_OTHER)
Admission: RE | Admit: 2017-06-26 | Discharge: 2017-06-26 | Disposition: A | Discharge status: Home / Self Care | Payer: Medicare HMO | Source: Ambulatory Visit | Attending: Family Medicine | Admitting: Family Medicine

## 2017-06-26 VITALS — BP 126/62 | HR 84 | Temp 98.0°F | Resp 16 | Wt 153.8 lb

## 2017-06-26 DIAGNOSIS — J019 Acute sinusitis, unspecified: Secondary | ICD-10-CM | POA: Diagnosis not present

## 2017-06-26 DIAGNOSIS — E039 Hypothyroidism, unspecified: Secondary | ICD-10-CM | POA: Diagnosis not present

## 2017-06-26 DIAGNOSIS — I10 Essential (primary) hypertension: Secondary | ICD-10-CM | POA: Diagnosis not present

## 2017-06-26 DIAGNOSIS — M542 Cervicalgia: Secondary | ICD-10-CM

## 2017-06-26 DIAGNOSIS — R251 Tremor, unspecified: Secondary | ICD-10-CM

## 2017-06-26 DIAGNOSIS — M47812 Spondylosis without myelopathy or radiculopathy, cervical region: Secondary | ICD-10-CM | POA: Diagnosis not present

## 2017-06-26 DIAGNOSIS — E785 Hyperlipidemia, unspecified: Secondary | ICD-10-CM | POA: Diagnosis not present

## 2017-06-26 LAB — TSH: TSH: 2.16 u[IU]/mL (ref 0.35–4.50)

## 2017-06-26 MED ORDER — ATENOLOL 25 MG PO TABS: 25.0000 mg | ORAL_TABLET | Freq: Every day | ORAL | 3 refills | Status: DC

## 2017-06-26 MED FILL — ATENOLOL 25 MG TABLET: 25 | 90 days supply | Qty: 90 | Fill #0

## 2017-06-26 NOTE — Patient Instructions (Addendum)
Lidocaine patches or gel for the neck  Tremor A tremor is trembling or shaking that you cannot control. Most tremors affect the hands or arms. Tremors can also affect the head, vocal cords, face, and other parts of the body. There are many types of tremors. Common types include:  Essential tremor. These usually occur in people over the age of 29. It may run in families and can happen in otherwise healthy people.  Resting tremor. These occur when the muscles are at rest, such as when your hands are resting in your lap. People with Parkinson disease often have resting tremors.  Postural tremor. These occur when you try to hold a pose, such as keeping your hands outstretched.  Kinetic tremor. These occur during purposeful movement, such as trying to touch a finger to your nose.  Task-specific tremor. These may occur when you perform tasks such as handwriting, speaking, or standing.  Psychogenic tremor. These dramatically lessen or disappear when you are distracted. They can happen in people of all ages.  Some types of tremors have no known cause. Tremors can also be a symptom of nervous system problems (neurological disorders) that may occur with aging. Some tremors go away with treatment while others do not. Follow these instructions at home: Watch your tremor for any changes. The following actions may help to lessen any discomfort you are feeling:  Take medicines only as directed by your health care provider.  Limit alcohol intake to no more than 1 drink per day for nonpregnant women and 2 drinks per day for men. One drink equals 12 oz of beer, 5 oz of wine, or 1 oz of hard liquor.  Do not use any tobacco products, including cigarettes, chewing tobacco, or electronic cigarettes. If you need help quitting, ask your health care provider.  Avoid extreme heat or cold.  Limit the amount of caffeine you consumeas directed by your health care provider.  Try to get 8 hours of sleep each  night.  Find ways to manage your stress, such as meditation or yoga.  Keep all follow-up visits as directed by your health care provider. This is important.  Contact a health care provider if:  You start having a tremor after starting a new medicine.  You have tremor with other symptoms such as: ? Numbness. ? Tingling. ? Pain. ? Weakness.  Your tremor gets worse.  Your tremor interferes with your day-to-day life. This information is not intended to replace advice given to you by your health care provider. Make sure you discuss any questions you have with your health care provider. Document Released: 06/20/2002 Document Revised: 03/02/2016 Document Reviewed: 12/26/2013 Elsevier Interactive Patient Education  [employer].

## 2017-06-26 NOTE — Assessment & Plan Note (Signed)
Started on Atenolol 25 mg daily and referred to neurology for further consideration.

## 2017-06-26 NOTE — Progress Notes (Signed)
Subjective:  I acted as a Education administrator for [employer]. Crystal [name], [name]   Patient ID: Crystal [name], female    DOB: 01-17-1951, 66 y.o.   MRN: [id number]  No chief complaint on file.   HPI  Patient is in today for evaluation of a tremor. She and her family have been noting it for about a year. She notes it has been worsening over the past couple of months.. It is in both hands but worse in right hand. It is worth with fatigue. She has a family history of parkinson;s disease. She notes increased trouble with her gait and feeling unsteady on her feet. Denies CP/palp/SOB/HA/congestion/fevers/GI or GU c/o. Taking meds as prescribed  Patient Care Team: [name], MD as PCP - General (Family Medicine) Calvert Cantor, MD as Consulting Physician (Ophthalmology) Gatha Mayer, MD as Consulting Physician (Gastroenterology) Leinbach, Sol Blazing, MD as Referring Physician (Otolaryngology) Dene Gentry, MD as Consulting Physician (Sports Medicine) Rigoberto Noel, MD as Consulting Physician (Pulmonary Disease) Camelia Phenes, DPM as Consulting Physician (Podiatry) Phipps, C. Shanon Brow, MD as Consulting Physician (Otolaryngology)   Past Medical History:  Diagnosis Date  . Acute peptic ulcer of stomach    As a teenager  . Allergy    dust, cock roach,grass,weeds,molds  . Anxiety   . Asthma    02/08/10 FEV1 1.98 (80%), s/p saba 2.22 l/m (90%).nml  . Cognitive change 06/10/2016  . Depression   . Diabetes mellitus 2003   Type II  . Diverticulosis   . Dyspepsia   . Fatty liver 12/19/2010  . GERD (gastroesophageal reflux disease)   . High cholesterol   . Hyperlipemia   . Hypertension   . Hypothyroidism 09/08/2013  . IBS (irritable bowel syndrome)   . Internal hemorrhoids   . Joint pain 08/08/2016  . Low back pain 08/10/2016  . OSA (obstructive sleep apnea)   . Plantar fasciitis   . Sun-damaged skin 12/09/2016  . Vitamin D deficiency 03/04/2016    Past Surgical History:    Procedure Laterality Date  . APPENDECTOMY     age 20  . CATARACT EXTRACTION Bilateral 2016   Dr. Bing Plume  . CATARACT EXTRACTION, BILATERAL     12/8 and 12/14  . COLONOSCOPY  06/03/2010, 08/25/2011   diverticulosis   . GASTRECTOMY     partial - ulcer as a teen  . HAND SURGERY Right   . HEMORRHOID BANDING    . KNEE ARTHROSCOPY Left 1998  . NASAL SEPTUM SURGERY    . RHINOPLASTY    . TOE SURGERY Right 1995    Family History  Problem Relation Age of Onset  . Diabetes Mother        type II  . Heart attack Mother 40  . Stroke Mother   . Colon cancer Sister   . Asthma Maternal Grandmother   . Mental retardation Son        PTSD  . Parkinsonism Father   . Cancer Sister        stage 4 bladder cancer  . Colon polyps Sister   . Ulcers Unknown        bleeding gastric  . Heart defect Unknown        child  . Other Unknown        perforated bowel, child  . Amblyopia Neg Hx   . Blindness Neg Hx   . Cataracts Neg Hx   . Glaucoma Neg Hx   . Macular degeneration Neg Hx   .  Retinal detachment Neg Hx   . Strabismus Neg Hx   . Retinitis pigmentosa Neg Hx     Social History   Socioeconomic History  . Marital status: Married    Spouse name: [name]  . Number of children: 4  . Years of education: Not on file  . Highest education level: Not on file  Social Needs  . Financial resource strain: Not on file  . Food insecurity - worry: Not on file  . Food insecurity - inability: Not on file  . Transportation needs - medical: Not on file  . Transportation needs - non-medical: Not on file  Occupational History  . Occupation: retired    Fish farm manager: [employer]  Tobacco Use  . Smoking status: Never Smoker  . Smokeless tobacco: Never Used  Substance and Sexual Activity  . Alcohol use: No    Alcohol/week: 0.0 oz  . Drug use: No  . Sexual activity: No    Comment: lives with her mother, no dietary restrictions  Other Topics Concern  . Not on file  Social History Narrative   No caffeine  drinks daily     Outpatient Medications Prior to Visit  Medication Sig Dispense Refill  . ACCU-CHEK SOFTCLIX LANCETS lancets Use as directed twice daily to check blood sugar. DX E11.9 200 each 3  . Alcohol Swabs (B-D SINGLE USE SWABS REGULAR) PADS Use as directed to check blood sugar. DX E11.9 100 each 3  . atorvastatin (LIPITOR) 80 MG tablet TAKE 1 TABLET EVERY DAY 90 tablet 2  . Blood Glucose Calibration (ACCU-CHEK AVIVA) SOLN Use as directed to check blood sugar.  DX E11.9 1 each 3  . Blood Glucose Monitoring Suppl (ACCU-CHEK AVIVA PLUS) w/Device KIT Use as directed to check blood sugar.  DX E11.9 1 kit 0  . cetirizine (ZYRTEC) 10 MG tablet Take 1 tablet (10 mg total) by mouth daily as needed for allergies. 30 tablet 11  . Cholecalciferol (VITAMIN D3) 2000 units TABS Take 1 tablet by mouth daily.    Marland Kitchen donepezil (ARICEPT) 5 MG tablet Take 1 tablet (5 mg total) by mouth at bedtime. 30 tablet 2  . empagliflozin (JARDIANCE) 10 MG TABS tablet Take 10 mg by mouth daily. 30 tablet 5  . escitalopram (LEXAPRO) 20 MG tablet TAKE 1 TABLET EVERY DAY 90 tablet 1  . famciclovir (FAMVIR) 500 MG tablet 1000 mg po bid x 24 hours then 250 mg po bid every day 35 tablet 3  . fenofibrate 160 MG tablet Take 1 tablet (160 mg total) by mouth daily. (Patient taking differently: Take 160 mg by mouth at bedtime. ) 90 tablet 2  . fluticasone (FLONASE) 50 MCG/ACT nasal spray Place 2 sprays into both nostrils daily. 16 g 3  . glimepiride (AMARYL) 4 MG tablet Take 1 tablet (4 mg total) by mouth 2 (two) times daily. 180 tablet 1  . glucose blood (ACCU-CHEK AVIVA PLUS) test strip Use as directed twice daily to check blood sugar.  DX E11.9 200 each 3  . levothyroxine (SYNTHROID, LEVOTHROID) 25 MCG tablet Take 1 tablet (25 mcg total) by mouth daily before breakfast. 90 tablet 2  . losartan (COZAAR) 50 MG tablet Take 1 tablet (50 mg total) by mouth daily. 90 tablet 1  . montelukast (SINGULAIR) 10 MG tablet Take 1 tablet (10 mg  total) by mouth at bedtime as needed (allergies). 30 tablet 3  . Multiple Vitamins-Minerals (CENTRUM SILVER PO) Take 1 tablet by mouth daily.      . pantoprazole (  PROTONIX) 40 MG tablet TAKE 1 TABLET (40 MG TOTAL) BY MOUTH DAILY. 90 tablet 0  . prednisoLONE acetate (PRED FORTE) 1 % ophthalmic suspension   0  . Probiotic Product (ALIGN) 4 MG CAPS Take 1 capsule by mouth daily. Reported on 07/02/2015    . valACYclovir (VALTREX) 1000 MG tablet   6  . VENTOLIN HFA 108 (90 Base) MCG/ACT inhaler INHALE 2 PUFFS BY MOUTH INTO THE LUNGS EVERY 4 (FOUR) HOURS AS NEEDED FOR SHORTNESS OF BREATH AND WHEEZING 18 g 0   No facility-administered medications prior to visit.     Allergies  Allergen Reactions  . Compazine [Prochlorperazine Edisylate]     Comma for 3 days  . Tetanus Toxoid Other (See Comments)    convulsions  . Sulfa Antibiotics Other (See Comments)    Unknown    Review of Systems  Constitutional: Negative for fever and malaise/fatigue.  HENT: Negative for congestion.   Eyes: Negative for blurred vision.  Respiratory: Negative for shortness of breath.   Cardiovascular: Negative for chest pain, palpitations and leg swelling.  Gastrointestinal: Negative for abdominal pain, blood in stool and nausea.  Genitourinary: Negative for dysuria and frequency.  Musculoskeletal: Negative for falls.  Skin: Negative for rash.  Neurological: Positive for dizziness and tremors. Negative for loss of consciousness and headaches.  Endo/Heme/Allergies: Negative for environmental allergies.  Psychiatric/Behavioral: Negative for depression. The patient is not nervous/anxious.        Objective:    Physical Exam  Constitutional: She is oriented to person, place, and time. She appears well-developed and well-nourished. No distress.  HENT:  Head: Normocephalic and atraumatic.  Nose: Nose normal.  Eyes: Right eye exhibits no discharge. Left eye exhibits no discharge.  Neck: Normal range of motion. Neck  supple.  Cardiovascular: Normal rate and regular rhythm.  No murmur heard. Pulmonary/Chest: Effort normal and breath sounds normal.  Abdominal: Soft. Bowel sounds are normal. There is no tenderness.  Musculoskeletal: She exhibits no edema.  stuggles with FNF. Short step gait.   Neurological: She is alert and oriented to person, place, and time.  Skin: Skin is warm and dry.  Psychiatric: She has a normal mood and affect.  Nursing note and vitals reviewed.   BP 126/62 (BP Location: Left Arm, Patient Position: Sitting, Cuff Size: Normal)   Pulse 84   Temp 98 F (36.7 C) (Oral)   Resp 16   Wt 153 lb 12.8 oz (69.8 kg)   SpO2 97%   BMI 28.13 kg/m  Wt Readings from Last 3 Encounters:  06/26/17 153 lb 12.8 oz (69.8 kg)  06/17/17 153 lb (69.4 kg)  06/15/17 155 lb 9.6 oz (70.6 kg)   BP Readings from Last 3 Encounters:  06/26/17 126/62  06/15/17 138/82  05/10/17 134/78     Immunization History  Administered Date(s) Administered  . Influenza Split 05/26/2011, 03/31/2012  . Influenza Whole 03/27/2010  . Influenza, High Dose Seasonal PF 04/02/2017  . Influenza,inj,Quad PF,6+ Mos 03/29/2013, 04/13/2014, 04/02/2015, 04/23/2016  . Pneumococcal Conjugate-13 04/25/2013  . Pneumococcal Polysaccharide-23 07/14/2004, 08/03/2015  . Zoster 12/27/2013    Health Maintenance  Topic Date Due  . OPHTHALMOLOGY EXAM  05/06/2016  . FOOT EXAM  10/06/2017  . HEMOGLOBIN A1C  12/14/2017  . MAMMOGRAM  04/14/2018  . COLONOSCOPY  05/19/2021  . TETANUS/TDAP  09/10/2021  . INFLUENZA VACCINE  Completed  . DEXA SCAN  Completed  . Hepatitis C Screening  Completed  . PNA vac Low Risk Adult  Completed  Lab Results  Component Value Date   WBC 5.8 03/13/2017   HGB 13.5 03/13/2017   HCT 41.0 03/13/2017   PLT 272.0 03/13/2017   GLUCOSE 210 (H) 03/13/2017   CHOL 116 03/13/2017   TRIG 64.0 03/13/2017   HDL 43.70 03/13/2017   LDLDIRECT 123.0 09/18/2014   LDLCALC 60 03/13/2017   ALT 23 03/13/2017    AST 17 03/13/2017   NA 139 03/13/2017   K 4.4 03/13/2017   CL 105 03/13/2017   CREATININE 0.71 03/13/2017   BUN 12 03/13/2017   CO2 28 03/13/2017   TSH 2.16 06/26/2017   HGBA1C 7.1 06/15/2017   MICROALBUR 1.3 04/30/2015    Lab Results  Component Value Date   TSH 2.16 06/26/2017   Lab Results  Component Value Date   WBC 5.8 03/13/2017   HGB 13.5 03/13/2017   HCT 41.0 03/13/2017   MCV 98.0 03/13/2017   PLT 272.0 03/13/2017   Lab Results  Component Value Date   NA 139 03/13/2017   K 4.4 03/13/2017   CO2 28 03/13/2017   GLUCOSE 210 (H) 03/13/2017   BUN 12 03/13/2017   CREATININE 0.71 03/13/2017   BILITOT 0.4 03/13/2017   ALKPHOS 41 03/13/2017   AST 17 03/13/2017   ALT 23 03/13/2017   PROT 6.8 03/13/2017   ALBUMIN 4.2 03/13/2017   CALCIUM 9.6 03/13/2017   ANIONGAP 7 01/08/2015   GFR 87.04 03/13/2017   Lab Results  Component Value Date   CHOL 116 03/13/2017   Lab Results  Component Value Date   HDL 43.70 03/13/2017   Lab Results  Component Value Date   LDLCALC 60 03/13/2017   Lab Results  Component Value Date   TRIG 64.0 03/13/2017   Lab Results  Component Value Date   CHOLHDL 3 03/13/2017   Lab Results  Component Value Date   HGBA1C 7.1 06/15/2017         Assessment & Plan:   Problem List Items Addressed This Visit    Hyperlipidemia    Tolerating statin, encouraged heart healthy diet, avoid trans fats, minimize simple carbs and saturated fats. Increase exercise as tolerated      Relevant Medications   atenolol (TENORMIN) 25 MG tablet   Essential hypertension    Well controlled, no changes to meds. Encouraged heart healthy diet such as the DASH diet and exercise as tolerated.       Relevant Medications   atenolol (TENORMIN) 25 MG tablet   Hypothyroidism    TSH WNL      Relevant Medications   atenolol (TENORMIN) 25 MG tablet   Sinusitis   Tremor    Started on Atenolol 25 mg daily and referred to neurology for further  consideration.      Relevant Orders   DG Cervical Spine Complete (Completed)   Ambulatory referral to Neurology   TSH (Completed)    Other Visit Diagnoses    Neck pain    -  Primary   Relevant Orders   DG Cervical Spine Complete (Completed)      I am having Payson P. Godina start on atenolol. I am also having her maintain her Multiple Vitamins-Minerals (CENTRUM SILVER PO), ALIGN, levothyroxine, fenofibrate, ACCU-CHEK AVIVA PLUS, ACCU-CHEK AVIVA, B-D SINGLE USE SWABS REGULAR, glucose blood, VENTOLIN HFA, Vitamin D3, famciclovir, valACYclovir, glimepiride, losartan, ACCU-CHEK SOFTCLIX LANCETS, empagliflozin, pantoprazole, atorvastatin, donepezil, prednisoLONE acetate, fluticasone, cetirizine, montelukast, and escitalopram.  Meds ordered this encounter  Medications  . atenolol (TENORMIN) 25 MG tablet    Sig:  Take 1 tablet (25 mg total) by mouth daily.    Dispense:  90 tablet    Refill:  3    CMA served as scribe during this visit. History, Physical and Plan performed by medical provider. Documentation and orders reviewed and attested to.  Penni Homans, MD

## 2017-06-28 NOTE — Assessment & Plan Note (Signed)
TSH WNL

## 2017-06-28 NOTE — Assessment & Plan Note (Signed)
Tolerating statin, encouraged heart healthy diet, avoid trans fats, minimize simple carbs and saturated fats. Increase exercise as tolerated 

## 2017-06-28 NOTE — Assessment & Plan Note (Signed)
Well controlled, no changes to meds. Encouraged heart healthy diet such as the DASH diet and exercise as tolerated.  °

## 2017-07-08 ENCOUNTER — Telehealth: Payer: Self-pay | Admitting: Internal Medicine

## 2017-07-08 NOTE — Telephone Encounter (Signed)
Left message on machine to call back  

## 2017-07-08 NOTE — Telephone Encounter (Signed)
The pt has been advised to take imodium 2 in the morning and after BM at lunch and bedtime if needed.  She will keep appt with Janett Billow for 07/21/17.  She will call back on Friday if no relief.

## 2017-07-14 ENCOUNTER — Other Ambulatory Visit: Payer: Self-pay | Admitting: Family Medicine

## 2017-07-15 ENCOUNTER — Other Ambulatory Visit: Payer: Self-pay

## 2017-07-15 DIAGNOSIS — G3184 Mild cognitive impairment, so stated: Secondary | ICD-10-CM

## 2017-07-15 MED ORDER — ACCU-CHEK SOFTCLIX LANCETS MISC: 3 refills | Status: DC

## 2017-07-15 MED ORDER — ACCU-CHEK AVIVA VI SOLN: 3 refills | Status: DC

## 2017-07-15 MED ORDER — DONEPEZIL HCL 5 MG PO TABS: 5.0000 mg | ORAL_TABLET | Freq: Every day | ORAL | 2 refills | Status: DC

## 2017-07-15 MED ORDER — GLUCOSE BLOOD VI STRP: ORAL_STRIP | 3 refills | Status: DC

## 2017-07-15 MED ORDER — BD SWAB SINGLE USE REGULAR PADS: MEDICATED_PAD | 3 refills | Status: AC

## 2017-07-15 MED ORDER — MONTELUKAST SODIUM 10 MG PO TABS: 10.0000 mg | ORAL_TABLET | Freq: Every evening | ORAL | 3 refills | Status: DC | PRN

## 2017-07-17 ENCOUNTER — Ambulatory Visit: Payer: Medicare HMO | Admitting: Family Medicine

## 2017-07-21 ENCOUNTER — Ambulatory Visit: Payer: Self-pay | Admitting: Gastroenterology

## 2017-07-23 ENCOUNTER — Encounter: Payer: Self-pay | Admitting: Family Medicine

## 2017-07-23 ENCOUNTER — Ambulatory Visit: Payer: Medicare HMO | Admitting: Family Medicine

## 2017-07-23 DIAGNOSIS — M25562 Pain in left knee: Secondary | ICD-10-CM | POA: Diagnosis not present

## 2017-07-23 MED FILL — JARDIANCE 10 MG TABLET: 10 | 30 days supply | Qty: 30 | Fill #2

## 2017-07-23 NOTE — Patient Instructions (Signed)
These are the different medications you can take for this: Tylenol 500mg  1-2 tabs three times a day for pain. Capsaicin, aspercreme, or biofreeze topically up to four times a day may also help with pain (be careful if you use the capsaicin on your fingers though as it can burn if you touch your eyes). Some supplements that may help for arthritis: Boswellia extract, curcumin, pycnogenol Call me if you need anything otherwise follow up as needed.

## 2017-07-25 ENCOUNTER — Encounter: Payer: Self-pay | Admitting: Family Medicine

## 2017-07-25 NOTE — Progress Notes (Signed)
PCP: [name], MD  Subjective:   HPI: Patient is a 67 y.o. female here for left knee pain.  12/5: We saw patient previously for her right knee this year - arthritis in addition to a meniscus tear. However she reports over past 2-3 weeks she's had severely worsening left knee pain with throbbing. Pain level 6/10 and medial. Right knee just started hurting yesterday but not as bad at a 3/10 level. Has been taking aspirin, using icy hot, biofreeze, and patches. No skin changes, numbness.  07/23/17: Patient reports she's 80-90% improved compared to last visit following injection. She also has new shoes (orthopedic new balance). Pain level is 1/10 and dull. No skin changes, numbness.  Past Medical History:  Diagnosis Date  . Acute peptic ulcer of stomach    As a teenager  . Allergy    dust, cock roach,grass,weeds,molds  . Anxiety   . Asthma    02/08/10 FEV1 1.98 (80%), s/p saba 2.22 l/m (90%).nml  . Cognitive change 06/10/2016  . Depression   . Diabetes mellitus 2003   Type II  . Diverticulosis   . Dyspepsia   . Fatty liver 12/19/2010  . GERD (gastroesophageal reflux disease)   . High cholesterol   . Hyperlipemia   . Hypertension   . Hypothyroidism 09/08/2013  . IBS (irritable bowel syndrome)   . Internal hemorrhoids   . Joint pain 08/08/2016  . Low back pain 08/10/2016  . OSA (obstructive sleep apnea)   . Plantar fasciitis   . Sun-damaged skin 12/09/2016  . Vitamin D deficiency 03/04/2016    Current Outpatient Medications on File Prior to Visit  Medication Sig Dispense Refill  . ACCU-CHEK SOFTCLIX LANCETS lancets Use as directed twice daily to check blood sugar. DX E11.9 200 each 3  . Alcohol Swabs (B-D SINGLE USE SWABS REGULAR) PADS Use as directed to check blood sugar. DX E11.9 100 each 3  . atenolol (TENORMIN) 25 MG tablet Take 1 tablet (25 mg total) by mouth daily. 90 tablet 3  . atorvastatin (LIPITOR) 80 MG tablet TAKE 1 TABLET EVERY DAY 90 tablet 2  . Blood  Glucose Calibration (ACCU-CHEK AVIVA) SOLN Use as directed to check blood sugar.  DX E11.9 1 each 3  . Blood Glucose Monitoring Suppl (ACCU-CHEK AVIVA PLUS) w/Device KIT Use as directed to check blood sugar.  DX E11.9 1 kit 0  . cetirizine (ZYRTEC) 10 MG tablet Take 1 tablet (10 mg total) by mouth daily as needed for allergies. 30 tablet 11  . Cholecalciferol (VITAMIN D3) 2000 units TABS Take 1 tablet by mouth daily.    Marland Kitchen donepezil (ARICEPT) 5 MG tablet Take 1 tablet (5 mg total) by mouth at bedtime. 30 tablet 2  . empagliflozin (JARDIANCE) 10 MG TABS tablet Take 10 mg by mouth daily. 30 tablet 5  . escitalopram (LEXAPRO) 20 MG tablet TAKE 1 TABLET EVERY DAY 90 tablet 1  . famciclovir (FAMVIR) 500 MG tablet 1000 mg po bid x 24 hours then 250 mg po bid every day 35 tablet 3  . fenofibrate 160 MG tablet Take 1 tablet (160 mg total) by mouth daily. (Patient taking differently: Take 160 mg by mouth at bedtime. ) 90 tablet 2  . fluticasone (FLONASE) 50 MCG/ACT nasal spray Place 2 sprays into both nostrils daily. 16 g 3  . glimepiride (AMARYL) 4 MG tablet Take 1 tablet (4 mg total) by mouth 2 (two) times daily. 180 tablet 1  . glucose blood (ACCU-CHEK AVIVA PLUS) test  strip Use as directed twice daily to check blood sugar.  DX E11.9 200 each 3  . levothyroxine (SYNTHROID, LEVOTHROID) 25 MCG tablet Take 1 tablet (25 mcg total) by mouth daily before breakfast. 90 tablet 2  . losartan (COZAAR) 50 MG tablet Take 1 tablet (50 mg total) by mouth daily. 90 tablet 1  . montelukast (SINGULAIR) 10 MG tablet Take 1 tablet (10 mg total) by mouth at bedtime as needed (allergies). 30 tablet 3  . Multiple Vitamins-Minerals (CENTRUM SILVER PO) Take 1 tablet by mouth daily.      . pantoprazole (PROTONIX) 40 MG tablet TAKE 1 TABLET EVERY DAY 90 tablet 0  . prednisoLONE acetate (PRED FORTE) 1 % ophthalmic suspension   0  . Probiotic Product (ALIGN) 4 MG CAPS Take 1 capsule by mouth daily. Reported on 07/02/2015    .  valACYclovir (VALTREX) 1000 MG tablet   6  . VENTOLIN HFA 108 (90 Base) MCG/ACT inhaler INHALE 2 PUFFS BY MOUTH INTO THE LUNGS EVERY 4 (FOUR) HOURS AS NEEDED FOR SHORTNESS OF BREATH AND WHEEZING 18 g 0   No current facility-administered medications on file prior to visit.     Past Surgical History:  Procedure Laterality Date  . APPENDECTOMY     age 71  . CATARACT EXTRACTION Bilateral 2016   Dr. Bing Plume  . CATARACT EXTRACTION, BILATERAL     12/8 and 12/14  . COLONOSCOPY  06/03/2010, 08/25/2011   diverticulosis   . GASTRECTOMY     partial - ulcer as a teen  . HAND SURGERY Right   . HEMORRHOID BANDING    . KNEE ARTHROSCOPY Left 1998  . NASAL SEPTUM SURGERY    . RHINOPLASTY    . TOE SURGERY Right 1995    Allergies  Allergen Reactions  . Compazine [Prochlorperazine Edisylate]     Comma for 3 days  . Tetanus Toxoid Other (See Comments)    convulsions  . Sulfa Antibiotics Other (See Comments)    Unknown    Social History   Socioeconomic History  . Marital status: Married    Spouse name: [name]  . Number of children: 4  . Years of education: Not on file  . Highest education level: Not on file  Social Needs  . Financial resource strain: Not on file  . Food insecurity - worry: Not on file  . Food insecurity - inability: Not on file  . Transportation needs - medical: Not on file  . Transportation needs - non-medical: Not on file  Occupational History  . Occupation: retired    Fish farm manager: [employer]  Tobacco Use  . Smoking status: Never Smoker  . Smokeless tobacco: Never Used  Substance and Sexual Activity  . Alcohol use: No    Alcohol/week: 0.0 oz  . Drug use: No  . Sexual activity: No    Comment: lives with her mother, no dietary restrictions  Other Topics Concern  . Not on file  Social History Narrative   No caffeine drinks daily     Family History  Problem Relation Age of Onset  . Diabetes Mother        type II  . Heart attack Mother 11  . Stroke Mother   .  Colon cancer Sister   . Asthma Maternal Grandmother   . Mental retardation Son        PTSD  . Parkinsonism Father   . Cancer Sister        stage 4 bladder cancer  . Colon polyps Sister   .  Ulcers Unknown        bleeding gastric  . Heart defect Unknown        child  . Other Unknown        perforated bowel, child  . Amblyopia Neg Hx   . Blindness Neg Hx   . Cataracts Neg Hx   . Glaucoma Neg Hx   . Macular degeneration Neg Hx   . Retinal detachment Neg Hx   . Strabismus Neg Hx   . Retinitis pigmentosa Neg Hx     BP (!) 148/99   Pulse 94   Ht _0  (1.575 m)   Wt 152 lb (68.9 kg)   BMI 27.80 kg/m   Review of Systems: See HPI above.     Objective:  Physical Exam:  Gen: NAD, comfortable in exam room.  Left knee: No gross deformity, ecchymoses, swelling. No TTP. FROM with 5/5 strength. Negative ant/post drawers. Negative valgus/varus testing. Negative lachmanns. Negative mcmurrays, apleys, patellar apprehension. NV intact distally.   Assessment & Plan:  1. Left knee pain - 2/2 arthritis.  Much better following injection, supportive orthopedic shoes.  Reviewed tylenol, topical medications, supplements, aleve.  Ice/heat if needed.  Home exercises.  F/u prn.

## 2017-07-25 NOTE — Assessment & Plan Note (Signed)
2/2 arthritis.  Much better following injection, supportive orthopedic shoes.  Reviewed tylenol, topical medications, supplements, aleve.  Ice/heat if needed.  Home exercises.  F/u prn.

## 2017-07-27 ENCOUNTER — Other Ambulatory Visit: Payer: Self-pay | Admitting: Family Medicine

## 2017-07-27 ENCOUNTER — Ambulatory Visit (HOSPITAL_COMMUNITY): Payer: Self-pay | Admitting: Psychiatry

## 2017-07-29 ENCOUNTER — Ambulatory Visit: Payer: Medicare HMO | Admitting: Physician Assistant

## 2017-07-29 ENCOUNTER — Other Ambulatory Visit: Payer: Medicare HMO

## 2017-07-29 ENCOUNTER — Encounter: Payer: Self-pay | Admitting: Physician Assistant

## 2017-07-29 VITALS — BP 140/79 | HR 68 | Ht 62.0 in | Wt 154.0 lb

## 2017-07-29 DIAGNOSIS — R197 Diarrhea, unspecified: Secondary | ICD-10-CM | POA: Diagnosis not present

## 2017-07-29 MED ORDER — DICYCLOMINE HCL 10 MG PO CAPS: ORAL_CAPSULE | ORAL | 1 refills | Status: DC

## 2017-07-29 MED FILL — DICYCLOMINE 10 MG CAPSULE: 10 | 10 days supply | Qty: 60 | Fill #0

## 2017-07-29 NOTE — Patient Instructions (Signed)
Your physician has requested that you go to the basement for  lab work before leaving today.  We have sent the following medications to your pharmacy for you to pick up at your convenience: Dicyclomine 10 mg every 4-6 hours as needed for diarrhea or abd pain   Hold Jardiance to see if diarrhea resolves.

## 2017-07-29 NOTE — Progress Notes (Addendum)
Chief Complaint: Diarrhea  HPI:    Crystal [name] is a 67 year old female with a past medical history as listed below including IBS with diarrhea, who follows with Dr. Carlean Purl and presents to clinic today with a complaint of diarrhea and abdominal cramping.    Patient was last seen in clinic 04/30/16 by Dr. Carlean Purl.  At that time, she had some loose stool at times but overall regular defecation.  She did describe soiling her underwear with small amounts of soft feces and noticed that this is especially when she would walk and was awake and moving around.  Patient was scheduled for a colonoscopy at that time.    Colonoscopy completed 05/19/16 showed diverticulosis in the left and right colon as well as internal hemorrhoids and otherwise normal exam.  Patient was then scheduled for hemorrhoid banding and had this done.    Today, patient expresses that she started Jardiance about a month ago and around that time started with an increase in loose stools.  She has at least 4-5 loose stools at a time before she can "stand back up from the toilet" and this occurs at least 3-4 times a day.  Patient describes cramping beforehand as well as a feeling of nausea which is typically relieved afterwards.  She has tried over-the-counter Imodium with no help.  She denies any accompanying blood in her stools.  Patient describes history of being on Lotronex in the past which "worked for a long time until [employer] stopped paying for it".  Patient tells me she is "miserable".    Patient denies fever, chills, melena, weight loss, anorexia, heartburn or reflux.  Past Medical History:  Diagnosis Date  . Acute peptic ulcer of stomach    As a teenager  . Allergy    dust, cock roach,grass,weeds,molds  . Anxiety   . Asthma    02/08/10 FEV1 1.98 (80%), s/p saba 2.22 l/m (90%).nml  . Cognitive change 06/10/2016  . Depression   . Diabetes mellitus 2003   Type II  . Diverticulosis   . Dyspepsia   . Fatty liver 12/19/2010   . GERD (gastroesophageal reflux disease)   . High cholesterol   . Hyperlipemia   . Hypertension   . Hypothyroidism 09/08/2013  . IBS (irritable bowel syndrome)   . Internal hemorrhoids   . Joint pain 08/08/2016  . Low back pain 08/10/2016  . OSA (obstructive sleep apnea)   . Plantar fasciitis   . Sun-damaged skin 12/09/2016  . Vitamin D deficiency 03/04/2016    Past Surgical History:  Procedure Laterality Date  . APPENDECTOMY     age 27  . CATARACT EXTRACTION Bilateral 2016   Dr. Bing Plume  . CATARACT EXTRACTION, BILATERAL     12/8 and 12/14  . COLONOSCOPY  06/03/2010, 08/25/2011   diverticulosis   . GASTRECTOMY     partial - ulcer as a teen  . HAND SURGERY Right   . HEMORRHOID BANDING    . KNEE ARTHROSCOPY Left 1998  . NASAL SEPTUM SURGERY    . RHINOPLASTY    . TOE SURGERY Right 1995    Current Outpatient Medications  Medication Sig Dispense Refill  . ACCU-CHEK SOFTCLIX LANCETS lancets Use as directed twice daily to check blood sugar. DX E11.9 200 each 3  . Alcohol Swabs (B-D SINGLE USE SWABS REGULAR) PADS Use as directed to check blood sugar. DX E11.9 100 each 3  . atenolol (TENORMIN) 25 MG tablet Take 1 tablet (25 mg total) by mouth daily.  90 tablet 3  . atorvastatin (LIPITOR) 80 MG tablet TAKE 1 TABLET EVERY DAY 90 tablet 2  . Blood Glucose Calibration (ACCU-CHEK AVIVA) SOLN Use as directed to check blood sugar.  DX E11.9 1 each 3  . Blood Glucose Monitoring Suppl (ACCU-CHEK AVIVA PLUS) w/Device KIT USE AS DIRECTED TO CHECK BLOOD SUGAR. 1 kit 0  . cetirizine (ZYRTEC) 10 MG tablet Take 1 tablet (10 mg total) by mouth daily as needed for allergies. 30 tablet 11  . Cholecalciferol (VITAMIN D3) 2000 units TABS Take 1 tablet by mouth daily.    Marland Kitchen donepezil (ARICEPT) 5 MG tablet Take 1 tablet (5 mg total) by mouth at bedtime. 30 tablet 2  . empagliflozin (JARDIANCE) 10 MG TABS tablet Take 10 mg by mouth daily. 30 tablet 5  . escitalopram (LEXAPRO) 20 MG tablet TAKE 1 TABLET EVERY  DAY 90 tablet 1  . famciclovir (FAMVIR) 500 MG tablet 1000 mg po bid x 24 hours then 250 mg po bid every day 35 tablet 3  . fenofibrate 160 MG tablet Take 1 tablet (160 mg total) by mouth daily. (Patient taking differently: Take 160 mg by mouth at bedtime. ) 90 tablet 2  . fluticasone (FLONASE) 50 MCG/ACT nasal spray Place 2 sprays into both nostrils daily. 16 g 3  . glimepiride (AMARYL) 4 MG tablet Take 1 tablet (4 mg total) by mouth 2 (two) times daily. 180 tablet 1  . glucose blood (ACCU-CHEK AVIVA PLUS) test strip Use as directed twice daily to check blood sugar.  DX E11.9 200 each 3  . levothyroxine (SYNTHROID, LEVOTHROID) 25 MCG tablet TAKE 1 TABLET (25 MCG TOTAL) BY MOUTH DAILY BEFORE BREAKFAST. 90 tablet 2  . losartan (COZAAR) 50 MG tablet Take 1 tablet (50 mg total) by mouth daily. 90 tablet 1  . montelukast (SINGULAIR) 10 MG tablet Take 1 tablet (10 mg total) by mouth at bedtime as needed (allergies). 30 tablet 3  . Multiple Vitamins-Minerals (CENTRUM SILVER PO) Take 1 tablet by mouth daily.      . pantoprazole (PROTONIX) 40 MG tablet TAKE 1 TABLET EVERY DAY 90 tablet 0  . prednisoLONE acetate (PRED FORTE) 1 % ophthalmic suspension   0  . Probiotic Product (ALIGN) 4 MG CAPS Take 1 capsule by mouth daily. Reported on 07/02/2015    . valACYclovir (VALTREX) 1000 MG tablet   6  . VENTOLIN HFA 108 (90 Base) MCG/ACT inhaler INHALE 2 PUFFS BY MOUTH INTO THE LUNGS EVERY 4 (FOUR) HOURS AS NEEDED FOR SHORTNESS OF BREATH AND WHEEZING 18 g 0   No current facility-administered medications for this visit.     Allergies as of 07/29/2017 - Review Complete 07/25/2017  Allergen Reaction Noted  . Compazine [prochlorperazine edisylate]  06/15/2013  . Tetanus toxoid Other (See Comments)   . Sulfa antibiotics Other (See Comments)     Family History  Problem Relation Age of Onset  . Diabetes Mother        type II  . Heart attack Mother 63  . Stroke Mother   . Colon cancer Sister   . Asthma  Maternal Grandmother   . Mental retardation Son        PTSD  . Parkinsonism Father   . Cancer Sister        stage 4 bladder cancer  . Colon polyps Sister   . Ulcers Unknown        bleeding gastric  . Heart defect Unknown        child  .  Other Unknown        perforated bowel, child  . Amblyopia Neg Hx   . Blindness Neg Hx   . Cataracts Neg Hx   . Glaucoma Neg Hx   . Macular degeneration Neg Hx   . Retinal detachment Neg Hx   . Strabismus Neg Hx   . Retinitis pigmentosa Neg Hx     Social History   Socioeconomic History  . Marital status: Married    Spouse name: [name]  . Number of children: 4  . Years of education: Not on file  . Highest education level: Not on file  Social Needs  . Financial resource strain: Not on file  . Food insecurity - worry: Not on file  . Food insecurity - inability: Not on file  . Transportation needs - medical: Not on file  . Transportation needs - non-medical: Not on file  Occupational History  . Occupation: retired    Fish farm manager: [employer]  Tobacco Use  . Smoking status: Never Smoker  . Smokeless tobacco: Never Used  Substance and Sexual Activity  . Alcohol use: No    Alcohol/week: 0.0 oz  . Drug use: No  . Sexual activity: No    Comment: lives with her mother, no dietary restrictions  Other Topics Concern  . Not on file  Social History Narrative   No caffeine drinks daily     Review of Systems:    Constitutional: No weight loss, fever or chills Cardiovascular: No chest pain Respiratory: No SOB  Gastrointestinal: See HPI and otherwise negative   Physical Exam:  Vital signs: BP 140/79   Pulse 68   Ht '5\' 2"'  (1.575 m)   Wt 154 lb (69.9 kg)   SpO2 96%   BMI 28.17 kg/m    Constitutional: Elderly Caucasian female appears to be in NAD, Well developed, Well nourished, alert and cooperative Respiratory: Respirations even and unlabored. Lungs clear to auscultation bilaterally.   No wheezes, crackles, or rhonchi.  Cardiovascular:  Normal S1, S2. No MRG. Regular rate and rhythm. No peripheral edema, cyanosis or pallor.  Gastrointestinal:  Soft, nondistended, mild generalized ttp. No rebound or guarding. Normal bowel sounds. No appreciable masses or hepatomegaly. Rectal:  Not performed.  Psychiatric: Demonstrates good judgement and reason without abnormal affect or behaviors.  MOST RECENT LABS: CBC    Component Value Date/Time   WBC 5.8 03/13/2017 0841   RBC 4.19 03/13/2017 0841   HGB 13.5 03/13/2017 0841   HCT 41.0 03/13/2017 0841   PLT 272.0 03/13/2017 0841   MCV 98.0 03/13/2017 0841   MCH 31.4 01/17/2014 1045   MCHC 32.8 03/13/2017 0841   RDW 13.6 03/13/2017 0841   LYMPHSABS 2.4 11/27/2015 0825   MONOABS 0.4 11/27/2015 0825   EOSABS 0.1 11/27/2015 0825   BASOSABS 0.0 11/27/2015 0825    CMP     Component Value Date/Time   NA 139 03/13/2017 0841   K 4.4 03/13/2017 0841   CL 105 03/13/2017 0841   CO2 28 03/13/2017 0841   GLUCOSE 210 (H) 03/13/2017 0841   BUN 12 03/13/2017 0841   CREATININE 0.71 03/13/2017 0841   CREATININE 0.72 12/27/2013 1628   CALCIUM 9.6 03/13/2017 0841   PROT 6.8 03/13/2017 0841   ALBUMIN 4.2 03/13/2017 0841   AST 17 03/13/2017 0841   ALT 23 03/13/2017 0841   ALKPHOS 41 03/13/2017 0841   BILITOT 0.4 03/13/2017 0841   GFRNONAA >60 01/08/2015 1120   GFRNONAA 71 08/15/2013 1636   GFRAA >60 01/08/2015  1120   GFRAA 81 08/15/2013 1636    Assessment: 1.  Diarrhea: Increase in loose stools when patient started Jardiance a month ago, at least 4 times a day with multiple stools at a time, accompanied abdominal cramping, history of IBS-D, no recent antibiotics or other changes in medication; consider side effect from Jardiance versus infectious cause versus IBS D  Plan: 1.  Prescribed Dicyclomine 10 mg every 4-6 hours as needed for abdominal cramping/loose stools.  #60 with 1 refill 2.  Order stool studies to include a GI pathogen panel and O&P. 3.  Recommend the patient hold her  Jardiance.  If her diarrhea resolves she should let us know and also call her primary care provider so they can consider a different medication to control her diabetes.  If she continues with loose stool recommend she complete stool studies as above.  She can use Dicyclomine in the meantime. 4.  Patient does request possible Lotronex again in the future if stool studies are negative and she has no resolution after stopping Jardiance. 5.  Patient to follow in clinic with Dr. Carlean Purl in 4-6 weeks if symptoms continue.  Ellouise Newer, PA-C [facility] 07/29/2017, 10:51 AM  Cc: [name], MD   Agree with Ms. Maxen Rowland's evaluation and management.  Gatha Mayer, MD, [facility]

## 2017-07-31 ENCOUNTER — Telehealth: Payer: Self-pay | Admitting: Internal Medicine

## 2017-07-31 NOTE — Telephone Encounter (Signed)
We can try again Januvia 100 mg daily - maybe it is covered in the new year.

## 2017-07-31 NOTE — Telephone Encounter (Signed)
Patient stated that she had to stop taking the medication Jardiance you prescribed her, because it started up her IBS again and gave her a yeast infection (she is having burning and itching).  She has been off for two days and the symptoms are subsiding. Please advise on what to do

## 2017-07-31 NOTE — Telephone Encounter (Signed)
Please advise on below  

## 2017-08-04 ENCOUNTER — Other Ambulatory Visit: Payer: Self-pay

## 2017-08-04 ENCOUNTER — Telehealth: Payer: Self-pay | Admitting: Physician Assistant

## 2017-08-04 MED ORDER — SITAGLIPTIN PHOSPHATE 100 MG PO TABS: 100.0000 mg | ORAL_TABLET | Freq: Every day | ORAL | 1 refills | Status: DC

## 2017-08-04 MED ORDER — SITAGLIPTIN PHOSPHATE 100 MG PO TABS: 100.0000 mg | ORAL_TABLET | Freq: Every day | ORAL | 0 refills | Status: DC

## 2017-08-04 MED ORDER — EMPAGLIFLOZIN 10 MG PO TABS: 10.0000 mg | ORAL_TABLET | Freq: Every day | ORAL | 1 refills | Status: DC

## 2017-08-04 MED FILL — JANUVIA 100 MG TABLET: 100 | 30 days supply | Qty: 30 | Fill #0

## 2017-08-04 NOTE — Telephone Encounter (Signed)
Left message on machine to call back "see note"   Plan: 1.  Prescribed Dicyclomine 10 mg every 4-6 hours as needed for abdominal cramping/loose stools.  #60 with 1 refill 2.  Order stool studies to include a GI pathogen panel and O&P. 3.  Recommend the patient hold her Jardiance.  If her diarrhea resolves she should let us know and also call her primary care provider so they can consider a different medication to control her diabetes.  If she continues with loose stool recommend she complete stool studies as above.  She can use Dicyclomine in the meantime. 4.  Patient does request possible Lotronex again in the future if stool studies are negative and she has no resolution after stopping Jardiance. 5.  Patient to follow in clinic with Dr. Carlean Purl in 4-6 weeks if symptoms continue.  Has she been taking the dicyclomine?  Did she turn in the stool studies?  Has she stopped Jardiance?  Lortonex? ROV with Gessner on 09/07/17 3:30 pm

## 2017-08-04 NOTE — Telephone Encounter (Signed)
Called patient she is wiling to try I have called 30 day local and 90 day to mail order

## 2017-08-04 NOTE — Telephone Encounter (Signed)
The pt states she stopped jardiance and is taking dicyclomine.  She will turn in stool studies today and has been given the appt with Gesser.  We will call her with stool study results.

## 2017-08-05 ENCOUNTER — Other Ambulatory Visit: Payer: Medicare HMO

## 2017-08-05 DIAGNOSIS — R197 Diarrhea, unspecified: Secondary | ICD-10-CM | POA: Diagnosis not present

## 2017-08-06 LAB — GASTROINTESTINAL PATHOGEN PANEL PCR
C. difficile Tox A/B, PCR: NOT DETECTED
CAMPYLOBACTER, PCR: NOT DETECTED
Cryptosporidium, PCR: NOT DETECTED
E COLI 0157, PCR: NOT DETECTED
E coli (ETEC) LT/ST PCR: NOT DETECTED
E coli (STEC) stx1/stx2, PCR: NOT DETECTED
GIARDIA LAMBLIA, PCR: NOT DETECTED
Norovirus, PCR: NOT DETECTED
Rotavirus A, PCR: NOT DETECTED
SHIGELLA, PCR: NOT DETECTED
Salmonella, PCR: NOT DETECTED

## 2017-08-07 ENCOUNTER — Ambulatory Visit (INDEPENDENT_AMBULATORY_CARE_PROVIDER_SITE_OTHER): Payer: Medicare HMO | Admitting: Family Medicine

## 2017-08-07 ENCOUNTER — Encounter: Payer: Self-pay | Admitting: Family Medicine

## 2017-08-07 DIAGNOSIS — I10 Essential (primary) hypertension: Secondary | ICD-10-CM | POA: Diagnosis not present

## 2017-08-07 DIAGNOSIS — R251 Tremor, unspecified: Secondary | ICD-10-CM

## 2017-08-07 DIAGNOSIS — E559 Vitamin D deficiency, unspecified: Secondary | ICD-10-CM | POA: Diagnosis not present

## 2017-08-07 DIAGNOSIS — E1149 Type 2 diabetes mellitus with other diabetic neurological complication: Secondary | ICD-10-CM

## 2017-08-07 DIAGNOSIS — T300 Burn of unspecified body region, unspecified degree: Secondary | ICD-10-CM

## 2017-08-07 DIAGNOSIS — E785 Hyperlipidemia, unspecified: Secondary | ICD-10-CM | POA: Diagnosis not present

## 2017-08-07 LAB — COMPREHENSIVE METABOLIC PANEL
ALT: 20 U/L (ref 0–35)
AST: 17 U/L (ref 0–37)
Albumin: 3.9 g/dL (ref 3.5–5.2)
Alkaline Phosphatase: 47 U/L (ref 39–117)
BILIRUBIN TOTAL: 0.5 mg/dL (ref 0.2–1.2)
BUN: 16 mg/dL (ref 6–23)
CO2: 29 meq/L (ref 19–32)
Calcium: 8.9 mg/dL (ref 8.4–10.5)
Chloride: 104 mEq/L (ref 96–112)
Creatinine, Ser: 0.71 mg/dL (ref 0.40–1.20)
GFR: 86.93 mL/min (ref >60.00)
GLUCOSE: 149 mg/dL — AB (ref 70–99)
POTASSIUM: 3.8 meq/L (ref 3.5–5.1)
SODIUM: 140 meq/L (ref 135–145)
TOTAL PROTEIN: 6.8 g/dL (ref 6.0–8.3)

## 2017-08-07 LAB — OVA AND PARASITE EXAMINATION
CONCENTRATE RESULT: NONE SEEN
SPECIMEN QUALITY:: ADEQUATE
TRICHROME RESULT: NONE SEEN
VKL: 90096550

## 2017-08-07 LAB — CBC
HCT: 41.2 % (ref 36.0–46.0)
HEMOGLOBIN: 13.7 g/dL (ref 12.0–15.0)
MCHC: 33.3 g/dL (ref 30.0–36.0)
MCV: 93 fl (ref 78.0–100.0)
Platelets: 267 10*3/uL (ref 150.0–400.0)
RBC: 4.43 Mil/uL (ref 3.87–5.11)
RDW: 13.2 % (ref 11.5–15.5)
WBC: 6 10*3/uL (ref 4.0–10.5)

## 2017-08-07 LAB — LIPID PANEL
CHOL/HDL RATIO: 3
Cholesterol: 120 mg/dL (ref 0–200)
HDL: 44.5 mg/dL (ref >39.00)
LDL Cholesterol: 55 mg/dL (ref 0–99)
NONHDL: 75.44
Triglycerides: 100 mg/dL (ref 0.0–149.0)
VLDL: 20 mg/dL (ref 0.0–40.0)

## 2017-08-07 LAB — TSH: TSH: 3.62 u[IU]/mL (ref 0.35–4.50)

## 2017-08-07 LAB — HEMOGLOBIN A1C: HEMOGLOBIN A1C: 7.8 % — AB (ref 4.6–6.5)

## 2017-08-07 LAB — VITAMIN D 25 HYDROXY (VIT D DEFICIENCY, FRACTURES): VITD: 26.38 ng/mL — ABNORMAL LOW (ref 30.00–100.00)

## 2017-08-07 NOTE — Assessment & Plan Note (Signed)
Left forearm 2 weeks ago. Healing well, some open wound still centrally but no fluctuance or drainage. Continue to cover with abx ointment and bandage when working and open to air when able to protect.

## 2017-08-07 NOTE — Assessment & Plan Note (Signed)
Well controlled, no changes to meds. Encouraged heart healthy diet such as the DASH diet and exercise as tolerated.  °

## 2017-08-07 NOTE — Assessment & Plan Note (Addendum)
Check vitamin D and treat as needed

## 2017-08-07 NOTE — Assessment & Plan Note (Signed)
hgba1c acceptable, minimize simple carbs. Increase exercise as tolerated. Continue current meds 

## 2017-08-07 NOTE — Assessment & Plan Note (Signed)
Encouraged heart healthy diet, increase exercise, avoid trans fats, consider a krill oil cap daily 

## 2017-08-07 NOTE — Patient Instructions (Signed)
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
## 2017-08-09 NOTE — Assessment & Plan Note (Signed)
Is worsening and she does report some family members have been diagnosed with Parkinson's has an upcoming appointment with dr Tat of neurology.

## 2017-08-26 ENCOUNTER — Ambulatory Visit: Payer: Medicare HMO | Admitting: Neurology

## 2017-08-26 ENCOUNTER — Encounter: Payer: Self-pay | Admitting: Neurology

## 2017-08-26 VITALS — BP 147/85 | HR 59 | Ht 62.0 in | Wt 158.0 lb

## 2017-08-26 DIAGNOSIS — R251 Tremor, unspecified: Secondary | ICD-10-CM

## 2017-08-26 NOTE — Progress Notes (Signed)
Subjective:    Patient ID: Crystal [name] is a 67 y.o. female.  HPI     Star Age, MD, PhD [facility] [address] [place], [place] [address]  Dear Dr. Randel Pigg,   I saw your patient, Crystal [name], upon your kind request in my neurologic clinic today for initial consultation of her tremors. The patient is unaccompanied today. As you know, Crystal [name] is a 67 year old right-handed woman with an underlying complex medical history of, hyperlipidemia, hypothyroidism, IBS, reflux disease, diverticulosis, diabetes, depression, anxiety, asthma, allergies, vitamin D deficiency, sleep apnea, low back pain, joint pain, memory loss and overweight state, who reports a bilateral upper extremity tremor. She reports an intermittent tremor, affecting her right more than left hand over the past couple of years, lately, the past 6 months or so she feels that this has become noticeably worse. She has difficulty holding things at times. She reports no tremor at rest. I reviewed your office note from 06/26/2017.  She has previously seen Dr. Delice Lesch at [facility] neurology for memory loss, seen in January 2018. She had a brain MRI without contrast on 08/19/2016, ordered by Dr. Delice Lesch, which I reviewed: IMPRESSION: Normal brain MRI for age. She reports a family history of Parkinson's disease in her father. She is widowed and lives alone, she has 4 children, she works at [employer], about 24 hours per week, but her hours have been more erratic lately due to other employees being sick. She is a nonsmoker and does not drink alcohol on a regular basis, maybe once every 2-3 months, she does not utilize caffeine but endorses drinking tea and soda, mostly decaf, barely any water, maybe at the most 1 bottle per day. She had blood work through your office on 08/07/2017 which I reviewed. TSH was unremarkable at the time. Of note, she is on Lexapro. She is on a beta blocker,  atenolol.   His Past Medical History Is Significant For: Past Medical History:  Diagnosis Date  . Acute peptic ulcer of stomach    As a teenager  . Allergy    dust, cock roach,grass,weeds,molds  . Anxiety   . Asthma    02/08/10 FEV1 1.98 (80%), s/p saba 2.22 l/m (90%).nml  . Cognitive change 06/10/2016  . Depression   . Diabetes mellitus 2003   Type II  . Diverticulosis   . Dyspepsia   . Fatty liver 12/19/2010  . GERD (gastroesophageal reflux disease)   . High cholesterol   . Hyperlipemia   . Hypertension   . Hypothyroidism 09/08/2013  . IBS (irritable bowel syndrome)   . Internal hemorrhoids   . Joint pain 08/08/2016  . Low back pain 08/10/2016  . OSA (obstructive sleep apnea)   . Plantar fasciitis   . Sun-damaged skin 12/09/2016  . Vitamin D deficiency 03/04/2016    His Past Surgical History Is Significant For: Past Surgical History:  Procedure Laterality Date  . APPENDECTOMY     age 67  . CATARACT EXTRACTION Bilateral 2016   Dr. Bing Plume  . CATARACT EXTRACTION, BILATERAL     12/8 and 12/14  . COLONOSCOPY  06/03/2010, 08/25/2011   diverticulosis   . GASTRECTOMY     partial - ulcer as a teen  . HAND SURGERY Right   . HEMORRHOID BANDING    . KNEE ARTHROSCOPY Left 1998  . NASAL SEPTUM SURGERY    . RHINOPLASTY    . TOE SURGERY Right 1995    His Family  History Is Significant For: Family History  Problem Relation Age of Onset  . Diabetes Mother        type II  . Heart attack Mother 81  . Stroke Mother   . Colon cancer Sister   . Asthma Maternal Grandmother   . Mental retardation Son        PTSD  . Parkinsonism Father   . Cancer Sister        stage 4 bladder cancer  . Colon polyps Sister   . Ulcers Unknown        bleeding gastric  . Heart defect Unknown        child  . Other Unknown        perforated bowel, child  . Amblyopia Neg Hx   . Blindness Neg Hx   . Cataracts Neg Hx   . Glaucoma Neg Hx   . Macular degeneration Neg Hx   . Retinal detachment Neg  Hx   . Strabismus Neg Hx   . Retinitis pigmentosa Neg Hx     His Social History Is Significant For: Social History   Socioeconomic History  . Marital status: Married    Spouse name: [name]  . Number of children: 4  . Years of education: None  . Highest education level: None  Social Needs  . Financial resource strain: None  . Food insecurity - worry: None  . Food insecurity - inability: None  . Transportation needs - medical: None  . Transportation needs - non-medical: None  Occupational History  . Occupation: retired    Fish farm manager: [employer]  Tobacco Use  . Smoking status: Never Smoker  . Smokeless tobacco: Never Used  Substance and Sexual Activity  . Alcohol use: No    Alcohol/week: 0.0 oz  . Drug use: No  . Sexual activity: No    Comment: lives with her mother, no dietary restrictions  Other Topics Concern  . None  Social History Narrative   No caffeine drinks daily     His Allergies Are:  Allergies  Allergen Reactions  . Compazine [Prochlorperazine Edisylate]     Comma for 3 days  . Tetanus Toxoid Other (See Comments)    convulsions  . Sulfa Antibiotics Other (See Comments)    Unknown  :   His Current Medications Are:  Outpatient Encounter Medications as of 08/26/2017  Medication Sig  . ACCU-CHEK SOFTCLIX LANCETS lancets Use as directed twice daily to check blood sugar. DX E11.9  . Alcohol Swabs (B-D SINGLE USE SWABS REGULAR) PADS Use as directed to check blood sugar. DX E11.9  . atenolol (TENORMIN) 25 MG tablet Take 1 tablet (25 mg total) by mouth daily.  Marland Kitchen atorvastatin (LIPITOR) 80 MG tablet TAKE 1 TABLET EVERY DAY  . Blood Glucose Calibration (ACCU-CHEK AVIVA) SOLN Use as directed to check blood sugar.  DX E11.9  . Blood Glucose Monitoring Suppl (ACCU-CHEK AVIVA PLUS) w/Device KIT USE AS DIRECTED TO CHECK BLOOD SUGAR.  . cetirizine (ZYRTEC) 10 MG tablet Take 1 tablet (10 mg total) by mouth daily as needed for allergies.  . Cholecalciferol (VITAMIN D3) 2000  units TABS Take 1 tablet by mouth daily.  Marland Kitchen dicyclomine (BENTYL) 10 MG capsule EVERY 4-6 HOURS AS NEEDED FOR ABD PAIN AND CRAMPING OR DIARRHEA  . donepezil (ARICEPT) 5 MG tablet Take 1 tablet (5 mg total) by mouth at bedtime.  Marland Kitchen escitalopram (LEXAPRO) 20 MG tablet TAKE 1 TABLET EVERY DAY  . famciclovir (FAMVIR) 500 MG tablet 1000 mg po bid  x 24 hours then 250 mg po bid every day  . fenofibrate 160 MG tablet Take 1 tablet (160 mg total) by mouth daily. (Patient taking differently: Take 160 mg by mouth at bedtime. )  . fluticasone (FLONASE) 50 MCG/ACT nasal spray Place 2 sprays into both nostrils daily.  Marland Kitchen glimepiride (AMARYL) 4 MG tablet Take 1 tablet (4 mg total) by mouth 2 (two) times daily.  Marland Kitchen glucose blood (ACCU-CHEK AVIVA PLUS) test strip Use as directed twice daily to check blood sugar.  DX E11.9  . levothyroxine (SYNTHROID, LEVOTHROID) 25 MCG tablet TAKE 1 TABLET (25 MCG TOTAL) BY MOUTH DAILY BEFORE BREAKFAST.  Marland Kitchen losartan (COZAAR) 50 MG tablet Take 1 tablet (50 mg total) by mouth daily.  . montelukast (SINGULAIR) 10 MG tablet Take 1 tablet (10 mg total) by mouth at bedtime as needed (allergies).  . Multiple Vitamins-Minerals (CENTRUM SILVER PO) Take 1 tablet by mouth daily.    . pantoprazole (PROTONIX) 40 MG tablet TAKE 1 TABLET EVERY DAY  . Probiotic Product (ALIGN) 4 MG CAPS Take 1 capsule by mouth daily. Reported on 07/02/2015  . sitaGLIPtin (JANUVIA) 100 MG tablet Take 1 tablet (100 mg total) by mouth daily.  . valACYclovir (VALTREX) 1000 MG tablet   . VENTOLIN HFA 108 (90 Base) MCG/ACT inhaler INHALE 2 PUFFS BY MOUTH INTO THE LUNGS EVERY 4 (FOUR) HOURS AS NEEDED FOR SHORTNESS OF BREATH AND WHEEZING  . [DISCONTINUED] empagliflozin (JARDIANCE) 10 MG TABS tablet Take 10 mg by mouth daily.  . [DISCONTINUED] prednisoLONE acetate (PRED FORTE) 1 % ophthalmic suspension    No facility-administered encounter medications on file as of 08/26/2017.   :   Review of Systems:  Out of a complete  14 point review of systems, all are reviewed and negative with the exception of these symptoms as listed below:  Review of Systems  Neurological:       Pt presents today to discuss her tremors. Pt says she has bilateral hand tremors but the right seems to be worse. Pt is right handed.    Objective:  Neurological Exam  Physical Exam Physical Examination:   Vitals:   08/26/17 1441  BP: (!) 147/85  Pulse: (!) 59    General Examination: The patient is a very pleasant 67 y.o. female in no acute distress. He appears well-developed and well-nourished and well groomed.   HEENT: Normocephalic, atraumatic, pupils are equal, round and reactive to light and accommodation. Extraocular tracking is good without limitation to gaze excursion or nystagmus noted. Normal smooth pursuit is noted. Hearing is grossly intact. Face is symmetric with normal facial animation and normal facial sensation. Speech is clear with no dysarthria noted. There is no hypophonia. There is no lip, neck/head, jaw or voice tremor. Neck is supple with full range of passive and active motion. There are no carotid bruits on auscultation. Oropharynx exam reveals: moderate mouth dryness, adequate dental hygiene with full dentures on top and partial on the bottom. Tongue protrudes centrally and palate elevates symmetrically.  Chest: Clear to auscultation without wheezing, rhonchi or crackles noted.  Heart: S1+S2+0, regular and normal without murmurs, rubs or gallops noted.   Abdomen: Soft, non-tender and non-distended with normal bowel sounds appreciated on auscultation.  Extremities: There is no pitting edema in the distal lower extremities bilaterally. Pedal pulses are intact.  Skin: Warm and dry without trophic changes noted.  Musculoskeletal: exam reveals no obvious joint deformities, tenderness or joint swelling or erythema.   Neurologically:  Mental status: The patient  is awake, alert and oriented in all 4 spheres. Her  immediate and remote memory, attention, language skills and fund of knowledge are appropriate. There is no evidence of aphasia, agnosia, apraxia or anomia. Speech is clear with normal prosody and enunciation. Thought process is linear. Mood is normal and affect is normal.  Cranial nerves II - XII are as described above under HEENT exam. In addition: shoulder shrug is normal with equal shoulder height noted. Motor exam: Normal bulk, strength and tone is noted. There is no drift, resting tremor or rebound. Romberg is negative. Reflexes are 2 to 3+ throughout. Babinski: Toes are flexor bilaterally. Fine motor skills and coordination: intact with normal finger taps, normal hand movements, normal rapid alternating patting, normal foot taps and normal foot agility. She has a mild and intermittent postural tremor, right more than left, irregular in frequency and amplitude. No significant action tremor.  08/26/2017: On Archimedes spiral drawing she has coarse irregularities and trembling with both hands, left more than right. Handwriting is legible, not micrographic, not particularly tremulous.  Cerebellar testing: No dysmetria or intention tremor on finger to nose testing. Heel to shin is unremarkable bilaterally. There is no truncal or gait ataxia.  Sensory exam: intact to light touch in the upper and lower extremities.  Gait, station and balance: She stands easily. No veering to one side is noted. No leaning to one side is noted. Posture is age-appropriate and stance is narrow based. Gait shows normal stride length and normal pace. No problems turning are noted. She has difficulty with tandem walk.                Assessment and Plan:    In summary, Crystal [name] is a very pleasant 67 y.o.-year old female with an underlying complex medical history of, hyperlipidemia, hypothyroidism, IBS, reflux disease, diverticulosis, diabetes, depression, anxiety, asthma, allergies, vitamin D deficiency, sleep apnea,  low back pain, joint pain, memory loss and overweight state, who presents for neurologic consultation of her tremors. She reports a 2 year history of so of intermittent tremors, right more than left, worsening in the past 5-6 months. On examination, she has an irregular postural tremor, right more so than left, no telltale history or exam for parkinsonism or essential tremor for that matter. She is advised that there can be certain triggers for tremor including anxiety, certain medications including antidepressant medications, excess caffeine. She endorses not actually drinking caffeine-containing beverages but she drinks quite a bit of decaf tea which may accumulate some caffeine. She does not hydrate very well with water. She's also advised that sleep deprivation can escalate tremors. She had a brain MRI last year in February which showed no acute abnormalities. She is overall reassured, I would not recommend any symptomatic medication for tremor control at this time. She is already on a beta blocker as well. At this juncture I suggested she stay better hydrated with water, reduce her tea and soda intake, stay well rested, try to achieve 7-8 hours of sleep. I can see her back on an as-needed basis. I answered all her questions today and she was in agreement. Thank you very much for allowing me to participate in the care of this nice patient. If I can be of any further assistance to you please do not hesitate to call me at [phone].  Sincerely,   Star Age, MD, PhD

## 2017-08-26 NOTE — Patient Instructions (Addendum)
You have had a brain MRI brain within the past year, no acute abnormality seen.   I do not see any signs or symptoms of parkinson's like disease or what we call parkinsonism.   You do have a mild appearing tremor affecting more your right hand, but it is on the mild side and intermittent. I would not recommend any new medication for fear of side effects or medication interaction.   I do want to suggest a few things today:  Remember to drink plenty of fluid at least 6 glasses (8 oz each), eat healthy meals and do not skip any meals. Try to eat protein with a every meal and eat a healthy snack such as fruit or nuts in between meals. Try to keep a regular sleep-wake schedule and try to exercise daily, particularly in the form of walking, 20-30 minutes a day, if you can.   Please remember, that any kind of tremor may be exacerbated by anxiety, anger, nervousness, excitement, dehydration, sleep deprivation, by caffeine, and low blood sugar values or blood sugar fluctuations. Some medications, especially some antidepressants and lithium can cause or exacerbate tremors.   Please reduce your soda and tea intake and increase your water intake, make enough time to sleep, about 7-8 hours.   I would be happy to see you back as needed.

## 2017-09-07 ENCOUNTER — Encounter: Payer: Self-pay | Admitting: Internal Medicine

## 2017-09-07 ENCOUNTER — Ambulatory Visit: Payer: Medicare HMO | Admitting: Internal Medicine

## 2017-09-07 VITALS — BP 160/86 | HR 80 | Ht 62.5 in | Wt 157.2 lb

## 2017-09-07 DIAGNOSIS — K580 Irritable bowel syndrome with diarrhea: Secondary | ICD-10-CM

## 2017-09-07 NOTE — Progress Notes (Signed)
Crystal [name] 67 y.o. 1950/08/05 [id number]  Assessment & Plan:   Encounter Diagnosis  Name Primary?  . Irritable bowel syndrome with diarrhea Yes   She is improved at this point and will continue with as needed dicyclomine.  I did inform her that sometimes Aricept, can cause diarrhea.  She is on a low dose of that for mild cognitive impairment and should she have more problems we may need to consider holding that.  I will see her back as needed.  I appreciate the opportunity to care for this patient. CC: Crystal Lukes, MD   Subjective:   Chief Complaint: Diarrhea, IBS  HPI Crystal [name] is here today for follow-up, she had seen Crystal [name] in January when she was having pretty bad diarrhea, and infectious workup with GI pathogen panel and C. difficile was negative.  She was treated with dicyclomine as needed, and over time things have gotten better.  She also stopped Jardiance and is now on Januvia.  She is tending toward slight constipation right now and is not taking the dicyclomine lately.  She has a long history of IBS and has been on Lotronex in the past but does not think she needs it now.  She is fairly satisfied with her quality of life at this point. Allergies  Allergen Reactions  . Compazine [Prochlorperazine Edisylate]     Comma for 3 days  . Tetanus Toxoid Other (See Comments)    convulsions  . Sulfa Antibiotics Other (See Comments)    Unknown   Current Meds  Medication Sig  . ACCU-CHEK SOFTCLIX LANCETS lancets Use as directed twice daily to check blood sugar. DX E11.9  . Alcohol Swabs (B-D SINGLE USE SWABS REGULAR) PADS Use as directed to check blood sugar. DX E11.9  . atenolol (TENORMIN) 25 MG tablet Take 1 tablet (25 mg total) by mouth daily.  Marland Kitchen atorvastatin (LIPITOR) 80 MG tablet TAKE 1 TABLET EVERY DAY  . Blood Glucose Calibration (ACCU-CHEK AVIVA) SOLN Use as directed to check blood sugar.  DX E11.9  . Blood Glucose Monitoring Suppl (ACCU-CHEK  AVIVA PLUS) w/Device KIT USE AS DIRECTED TO CHECK BLOOD SUGAR.  . cetirizine (ZYRTEC) 10 MG tablet Take 1 tablet (10 mg total) by mouth daily as needed for allergies.  . Cholecalciferol (VITAMIN D3) 2000 units TABS Take 1 tablet by mouth daily.  Marland Kitchen dicyclomine (BENTYL) 10 MG capsule EVERY 4-6 HOURS AS NEEDED FOR ABD PAIN AND CRAMPING OR DIARRHEA  . donepezil (ARICEPT) 5 MG tablet Take 1 tablet (5 mg total) by mouth at bedtime.  Marland Kitchen escitalopram (LEXAPRO) 20 MG tablet TAKE 1 TABLET EVERY DAY  . famciclovir (FAMVIR) 500 MG tablet 1000 mg po bid x 24 hours then 250 mg po bid every day  . fenofibrate 160 MG tablet Take 1 tablet (160 mg total) by mouth daily. (Patient taking differently: Take 160 mg by mouth at bedtime. )  . fluticasone (FLONASE) 50 MCG/ACT nasal spray Place 2 sprays into both nostrils daily.  Marland Kitchen glimepiride (AMARYL) 4 MG tablet Take 1 tablet (4 mg total) by mouth 2 (two) times daily.  Marland Kitchen glucose blood (ACCU-CHEK AVIVA PLUS) test strip Use as directed twice daily to check blood sugar.  DX E11.9  . levothyroxine (SYNTHROID, LEVOTHROID) 25 MCG tablet TAKE 1 TABLET (25 MCG TOTAL) BY MOUTH DAILY BEFORE BREAKFAST.  Marland Kitchen losartan (COZAAR) 50 MG tablet Take 1 tablet (50 mg total) by mouth daily.  . montelukast (SINGULAIR) 10 MG tablet Take 1 tablet (10  mg total) by mouth at bedtime as needed (allergies).  . Multiple Vitamins-Minerals (CENTRUM SILVER PO) Take 1 tablet by mouth daily.    . pantoprazole (PROTONIX) 40 MG tablet TAKE 1 TABLET EVERY DAY  . Probiotic Product (ALIGN) 4 MG CAPS Take 1 capsule by mouth daily. Reported on 07/02/2015  . sitaGLIPtin (JANUVIA) 100 MG tablet Take 1 tablet (100 mg total) by mouth daily.  . valACYclovir (VALTREX) 1000 MG tablet   . VENTOLIN HFA 108 (90 Base) MCG/ACT inhaler INHALE 2 PUFFS BY MOUTH INTO THE LUNGS EVERY 4 (FOUR) HOURS AS NEEDED FOR SHORTNESS OF BREATH AND WHEEZING   Past Medical History:  Diagnosis Date  . Acute peptic ulcer of stomach    As a  teenager  . Allergy    dust, cock roach,grass,weeds,molds  . Anxiety   . Asthma    02/08/10 FEV1 1.98 (80%), s/p saba 2.22 l/m (90%).nml  . Cognitive change 06/10/2016  . Depression   . Diabetes mellitus 2003   Type II  . Diverticulosis   . Dyspepsia   . Fatty liver 12/19/2010  . GERD (gastroesophageal reflux disease)   . High cholesterol   . Hyperlipemia   . Hypertension   . Hypothyroidism 09/08/2013  . IBS (irritable bowel syndrome)   . Internal hemorrhoids   . Joint pain 08/08/2016  . Low back pain 08/10/2016  . OSA (obstructive sleep apnea)   . Plantar fasciitis   . Sun-damaged skin 12/09/2016  . Vitamin D deficiency 03/04/2016   Past Surgical History:  Procedure Laterality Date  . APPENDECTOMY     age 43  . CATARACT EXTRACTION Bilateral 2016   Dr. Bing Plume  . CATARACT EXTRACTION, BILATERAL     12/8 and 12/14  . COLONOSCOPY  06/03/2010, 08/25/2011   diverticulosis   . GASTRECTOMY     partial - ulcer as a teen  . HAND SURGERY Right   . HEMORRHOID BANDING    . KNEE ARTHROSCOPY Left 1998  . NASAL SEPTUM SURGERY    . RHINOPLASTY    . TOE SURGERY Right 1995   Social History   Social History Narrative   No caffeine drinks daily    family history includes Asthma in her maternal grandmother; Cancer in her sister; Colon cancer in her sister; Colon polyps in her sister; Diabetes in her mother; Heart attack (age of onset: 28) in her mother; Heart defect in her unknown relative; Mental retardation in her son; Other in her unknown relative; Parkinsonism in her father; Stroke in her mother; Ulcers in her unknown relative.   Review of Systems As per HPI  Objective:   Physical Exam BP (!) 160/86 (BP Location: Left Arm, Patient Position: Sitting, Cuff Size: Normal)   Pulse 80   Ht 5' 2.5" (1.588 m) Comment: height measured without shoes  Wt 157 lb 4 oz (71.3 kg)   BMI 28.30 kg/m  No acute distress

## 2017-09-07 NOTE — Patient Instructions (Signed)
   Glad things are better.  Keep in mind that the Aricept (donepezil) can also cause loose stools sometimes.  I appreciate the opportunity to care for you. Gatha Mayer, MD, [facility]

## 2017-09-11 DIAGNOSIS — M25532 Pain in left wrist: Secondary | ICD-10-CM | POA: Diagnosis not present

## 2017-09-11 DIAGNOSIS — M419 Scoliosis, unspecified: Secondary | ICD-10-CM | POA: Diagnosis not present

## 2017-09-11 DIAGNOSIS — M545 Low back pain: Secondary | ICD-10-CM | POA: Diagnosis not present

## 2017-09-11 DIAGNOSIS — M47896 Other spondylosis, lumbar region: Secondary | ICD-10-CM | POA: Diagnosis not present

## 2017-09-11 DIAGNOSIS — M47816 Spondylosis without myelopathy or radiculopathy, lumbar region: Secondary | ICD-10-CM | POA: Diagnosis not present

## 2017-09-11 DIAGNOSIS — M533 Sacrococcygeal disorders, not elsewhere classified: Secondary | ICD-10-CM | POA: Diagnosis not present

## 2017-09-11 DIAGNOSIS — M25531 Pain in right wrist: Secondary | ICD-10-CM | POA: Diagnosis not present

## 2017-09-14 ENCOUNTER — Encounter: Payer: Self-pay | Admitting: Internal Medicine

## 2017-09-14 ENCOUNTER — Ambulatory Visit: Payer: Medicare HMO | Admitting: Internal Medicine

## 2017-09-14 VITALS — BP 144/84 | HR 89 | Ht 62.5 in | Wt 158.6 lb

## 2017-09-14 DIAGNOSIS — E1149 Type 2 diabetes mellitus with other diabetic neurological complication: Secondary | ICD-10-CM | POA: Diagnosis not present

## 2017-09-14 DIAGNOSIS — E785 Hyperlipidemia, unspecified: Secondary | ICD-10-CM | POA: Diagnosis not present

## 2017-09-14 DIAGNOSIS — G629 Polyneuropathy, unspecified: Secondary | ICD-10-CM | POA: Diagnosis not present

## 2017-09-14 LAB — POCT GLYCOSYLATED HEMOGLOBIN (HGB A1C): HEMOGLOBIN A1C: 6.9

## 2017-09-14 NOTE — Patient Instructions (Signed)
Please continue: - Glimepiride 4 mg before b'fast. Please decrease the dose to 2 mg if you have a lighter meal or if you are very active after the meal. - Januvia 100 mg before b'fast  Please return in 3 months with your sugar log.

## 2017-09-14 NOTE — Progress Notes (Signed)
Patient ID: Crystal [name], female   DOB: 1951/04/26, 67 y.o.   MRN: [id number]   HPI: Crystal [name] is a 67 y.o.-year-old female, returning for follow-up for DM2, dx in 2003, non-insulin-dependent, uncontrolled, with complications (PN, DR).  Last visit with 2.5 months ago.  Last hemoglobin A1c was: Lab Results  Component Value Date   HGBA1C 7.8 (H) 08/07/2017   HGBA1C 7.1 06/15/2017   HGBA1C 7.3 (H) 03/13/2017   Pt is on a regimen of: - Amaryl 4 mg before breakfast -  - added 03/2017 - now pays 47$ >> however, she had to stop because of increased IBS symptoms and also yeast infection. - Januvia 100 mg before breakfast Metformin gave her diarrhea in the past. Januvia was too expensive. Invokana >> cannot remember why stopped  Pt checks her sugars once a day: - am: 120-125 >> 57, 105-145, 157 >> 85-115 - 2h after b'fast: 199 >> n/c - before lunch: n/c - 2h after lunch: 190-240s, 275 >> 127-135 >> 120-140s - before dinner: n/c - 2h after dinner: n/c - bedtime: 115-140  >> 135-157 >> 120-140s - nighttime: n/c Lowest sugar was 55 >> 57 >> 57 x2 in last year - if she has a lighter meal; she has hypoglycemia awareness in the 80s. Highest sugar was 320 >> 157 >> 187 (forgot med).  Glucometer: AccuChek [employer]iva  Pt's meals are: - Breakfast: bowl of cereals or bacon + egg + toast or egg sandwich or biscuit + gravy + hash browns; Still drinks OJ or milk. - Lunch: leftovers from dinner or tuna + salad dressing + tuna; box of mac and cheese + tuna; raman noodles; cheese + fruit - Dinner: may eat out (chinese)   -No CKD, last BUN/creatinine:  Lab Results  Component Value Date   BUN 16 08/07/2017   BUN 12 03/13/2017   CREATININE 0.71 08/07/2017   CREATININE 0.71 03/13/2017  On losartan 50. -+ HL; last set of lipids: Lab Results  Component Value Date   CHOL 120 08/07/2017   HDL 44.50 08/07/2017   LDLCALC 55 08/07/2017   LDLDIRECT 123.0 09/18/2014   TRIG 100.0  08/07/2017   CHOLHDL 3 08/07/2017  On Lipitor 80, fenofibrate 160. - last eye exam was in 2018: + DR. Sees retina specialist: Dr. Coralyn Pear. - No numbness and tingling in her feet.  + Numbness in fingertips.   Pt also has mild cognitive impairment.  ROS: Constitutional: no weight gain/no weight loss, no fatigue, no subjective hyperthermia, no subjective hypothermia Eyes: no blurry vision, no xerophthalmia ENT: no sore throat, no nodules palpated in throat, no dysphagia, no odynophagia, no hoarseness Cardiovascular: no CP/no SOB/no palpitations/no leg swelling Respiratory: no cough/no SOB/no wheezing Gastrointestinal: no N/no V/no D/no C/no acid reflux Musculoskeletal: no muscle aches/no joint aches Skin: no rashes, no hair loss Neurological: no tremors/+ numbness/no tingling/no dizziness  I reviewed pt's medications, allergies, PMH, social hx, family hx, and changes were documented in the history of present illness. Otherwise, unchanged from my initial visit note.  Past Medical History:  Diagnosis Date  . Acute peptic ulcer of stomach    As a teenager  . Allergy    dust, cock roach,grass,weeds,molds  . Anxiety   . Asthma    02/08/10 FEV1 1.98 (80%), s/p saba 2.22 l/m (90%).nml  . Cognitive change 06/10/2016  . Depression   . Diabetes mellitus 2003   Type II  . Diverticulosis   . Dyspepsia   . Fatty liver 12/19/2010  .  GERD (gastroesophageal reflux disease)   . High cholesterol   . Hyperlipemia   . Hypertension   . Hypothyroidism 09/08/2013  . IBS (irritable bowel syndrome)   . Internal hemorrhoids   . Joint pain 08/08/2016  . Low back pain 08/10/2016  . OSA (obstructive sleep apnea)   . Plantar fasciitis   . Sun-damaged skin 12/09/2016  . Vitamin D deficiency 03/04/2016   Past Surgical History:  Procedure Laterality Date  . APPENDECTOMY     age 52  . CATARACT EXTRACTION Bilateral 2016   Dr. Bing Plume  . CATARACT EXTRACTION, BILATERAL     12/8 and 12/14  . COLONOSCOPY   06/03/2010, 08/25/2011   diverticulosis   . GASTRECTOMY     partial - ulcer as a teen  . HAND SURGERY Right   . HEMORRHOID BANDING    . KNEE ARTHROSCOPY Left 1998  . NASAL SEPTUM SURGERY    . RHINOPLASTY    . TOE SURGERY Right 1995   Social History   Social History  . Marital status: widowed    Spouse name: Crystal [name]  . Number of children: 4  . Years of education: N/A   Occupational History  . retired Orthoptist   Social History Main Topics  . Smoking status: Never Smoker  . Smokeless tobacco: Never Used  . Alcohol use No  . Drug use: No  . Sexual activity: No     Comment: lives with her mother, no dietary restrictions   Other Topics Concern  . Not on file   Social History Narrative   No caffeine drinks daily    Current Outpatient Medications on File Prior to Visit  Medication Sig Dispense Refill  . ACCU-CHEK SOFTCLIX LANCETS lancets Use as directed twice daily to check blood sugar. DX E11.9 200 each 3  . Alcohol Swabs (B-D SINGLE USE SWABS REGULAR) PADS Use as directed to check blood sugar. DX E11.9 100 each 3  . atenolol (TENORMIN) 25 MG tablet Take 1 tablet (25 mg total) by mouth daily. 90 tablet 3  . atorvastatin (LIPITOR) 80 MG tablet TAKE 1 TABLET EVERY DAY 90 tablet 2  . Blood Glucose Calibration (ACCU-CHEK AVIVA) SOLN Use as directed to check blood sugar.  DX E11.9 1 each 3  . Blood Glucose Monitoring Suppl (ACCU-CHEK AVIVA PLUS) w/Device KIT USE AS DIRECTED TO CHECK BLOOD SUGAR. 1 kit 0  . cetirizine (ZYRTEC) 10 MG tablet Take 1 tablet (10 mg total) by mouth daily as needed for allergies. 30 tablet 11  . Cholecalciferol (VITAMIN D3) 2000 units TABS Take 1 tablet by mouth daily.    Marland Kitchen dicyclomine (BENTYL) 10 MG capsule EVERY 4-6 HOURS AS NEEDED FOR ABD PAIN AND CRAMPING OR DIARRHEA 60 capsule 1  . donepezil (ARICEPT) 5 MG tablet Take 1 tablet (5 mg total) by mouth at bedtime. 30 tablet 2  . escitalopram (LEXAPRO) 20 MG tablet TAKE 1 TABLET EVERY DAY 90 tablet 1  .  famciclovir (FAMVIR) 500 MG tablet 1000 mg po bid x 24 hours then 250 mg po bid every day 35 tablet 3  . fenofibrate 160 MG tablet Take 1 tablet (160 mg total) by mouth daily. (Patient taking differently: Take 160 mg by mouth at bedtime. ) 90 tablet 2  . fluticasone (FLONASE) 50 MCG/ACT nasal spray Place 2 sprays into both nostrils daily. 16 g 3  . glimepiride (AMARYL) 4 MG tablet Take 1 tablet (4 mg total) by mouth 2 (two) times daily. 180 tablet 1  . glucose  blood (ACCU-CHEK AVIVA PLUS) test strip Use as directed twice daily to check blood sugar.  DX E11.9 200 each 3  . levothyroxine (SYNTHROID, LEVOTHROID) 25 MCG tablet TAKE 1 TABLET (25 MCG TOTAL) BY MOUTH DAILY BEFORE BREAKFAST. 90 tablet 2  . losartan (COZAAR) 50 MG tablet Take 1 tablet (50 mg total) by mouth daily. 90 tablet 1  . montelukast (SINGULAIR) 10 MG tablet Take 1 tablet (10 mg total) by mouth at bedtime as needed (allergies). 30 tablet 3  . Multiple Vitamins-Minerals (CENTRUM SILVER PO) Take 1 tablet by mouth daily.      . pantoprazole (PROTONIX) 40 MG tablet TAKE 1 TABLET EVERY DAY 90 tablet 0  . Probiotic Product (ALIGN) 4 MG CAPS Take 1 capsule by mouth daily. Reported on 07/02/2015    . sitaGLIPtin (JANUVIA) 100 MG tablet Take 1 tablet (100 mg total) by mouth daily. 30 tablet 0  . valACYclovir (VALTREX) 1000 MG tablet   6  . VENTOLIN HFA 108 (90 Base) MCG/ACT inhaler INHALE 2 PUFFS BY MOUTH INTO THE LUNGS EVERY 4 (FOUR) HOURS AS NEEDED FOR SHORTNESS OF BREATH AND WHEEZING 18 g 0   No current facility-administered medications on file prior to visit.    Allergies  Allergen Reactions  . Compazine [Prochlorperazine Edisylate]     Comma for 3 days  . Tetanus Toxoid Other (See Comments)    convulsions  . Sulfa Antibiotics Other (See Comments)    Unknown   Family History  Problem Relation Age of Onset  . Diabetes Mother        type II  . Heart attack Mother 65  . Stroke Mother   . Colon cancer Sister   . Asthma Maternal  Grandmother   . Mental retardation Son        PTSD  . Parkinsonism Father   . Cancer Sister        stage 4 bladder cancer  . Colon polyps Sister   . Ulcers Unknown        bleeding gastric  . Heart defect Unknown        child  . Other Unknown        perforated bowel, child  . Amblyopia Neg Hx   . Blindness Neg Hx   . Cataracts Neg Hx   . Glaucoma Neg Hx   . Macular degeneration Neg Hx   . Retinal detachment Neg Hx   . Strabismus Neg Hx   . Retinitis pigmentosa Neg Hx    Pt has FH of DM in mother.  PE: BP (!) 144/84   Pulse 89   Ht 5' 2.5" (1.588 m)   Wt 158 lb 9.6 oz (71.9 kg)   SpO2 94%   BMI 28.55 kg/m  Wt Readings from Last 3 Encounters:  09/14/17 158 lb 9.6 oz (71.9 kg)  09/07/17 157 lb 4 oz (71.3 kg)  08/26/17 158 lb (71.7 kg)   Constitutional: overweight, in NAD Eyes: + R surgical pupil,, EOMI, no exophthalmos ENT: moist mucous membranes, no thyromegaly, no cervical lymphadenopathy Cardiovascular: RRR, No MRG Respiratory: CTA B Gastrointestinal: abdomen soft, NT, ND, BS+ Musculoskeletal: no deformities, strength intact in all 4 Skin: moist, warm, no rashes Neurological: no tremor with outstretched hands, DTR normal in all 4  ASSESSMENT: 1. DM2, non-insulin-dependent, uncontrolled, with complications - PN - DR  She saw nutrition in the past: "I know what I need to do, but I just cannot do it"  We are limited in our options for treatment  by the cost of the medicines.  At the previous visit, we called her pharmacy with patient in the room and almost all the medicines that we could use were more than $100 per month month for her...  2. HL  PLAN:  1. Patient with long-standing, uncontrolled, diabetes, on oral antidiabetic regimen, to which we added Januvia at last visit as sugars were still high after meals.  At that time, we also discussed about healthier options for her diet. - since last visit, she did great! Syugars are much better, despite having to  stop Jardiance 2/2 SEs - almost all sugars at goal, but some lows before lunch if she has a lighter b'fast >> advised her to halve the dose of Amaryl in that situation - I advised her to: Patient Instructions  Please continue: - Glimepiride 4 mg before b'fast. Please decrease the dose to 2 mg if you have a lighter meal or if you are very active after the meal. - Januvia 100 mg before b'fast  Please return in 3 months with your sugar log.   - today, HbA1c is 6.9% (improved) - continue checking sugars at different times of the day - check 1x a day, rotating checks - advised for yearly eye exams >> she is UTD - Return to clinic in 3 mo with sugar log   2. HL -Reviewed latest lipid panel: LDL much improved since 2016, triglycerides normal -Continue Lipitor 80 and fenofibrate.  No side effects.  3. Numbness in fingers - At last visit, we checked her B12 level and this was normal.  I suggested alpha lipoic acid and B complex >> did not start as sxs improved (thonks the sxs are from too much coffee >> cutting down now)  Philemon Kingdom, MD PhD [facility]

## 2017-09-23 MED FILL — ATENOLOL 25 MG TABLET: 25 | 90 days supply | Qty: 90 | Fill #1

## 2017-09-23 MED FILL — JANUVIA 100 MG TABLET: 100 | 30 days supply | Qty: 30 | Fill #1

## 2017-09-23 MED FILL — DICYCLOMINE 10 MG CAPSULE: 10 | 10 days supply | Qty: 60 | Fill #1

## 2017-09-24 ENCOUNTER — Encounter (HOSPITAL_COMMUNITY): Payer: Self-pay | Admitting: Psychiatry

## 2017-09-24 ENCOUNTER — Ambulatory Visit (HOSPITAL_COMMUNITY): Payer: Medicare HMO | Admitting: Psychiatry

## 2017-09-24 VITALS — BP 132/80 | HR 83 | Ht 62.0 in | Wt 160.0 lb

## 2017-09-24 DIAGNOSIS — Z810 Family history of intellectual disabilities: Secondary | ICD-10-CM | POA: Diagnosis not present

## 2017-09-24 DIAGNOSIS — F32A Depression, unspecified: Secondary | ICD-10-CM

## 2017-09-24 DIAGNOSIS — G4709 Other insomnia: Secondary | ICD-10-CM

## 2017-09-24 DIAGNOSIS — F329 Major depressive disorder, single episode, unspecified: Secondary | ICD-10-CM | POA: Diagnosis not present

## 2017-09-24 DIAGNOSIS — G3184 Mild cognitive impairment, so stated: Secondary | ICD-10-CM

## 2017-09-24 MED ORDER — DONEPEZIL HCL 10 MG PO TABS: 10.0000 mg | ORAL_TABLET | Freq: Every day | ORAL | 1 refills | Status: DC

## 2017-09-24 MED ORDER — ESCITALOPRAM OXALATE 20 MG PO TABS: 20.0000 mg | ORAL_TABLET | Freq: Every day | ORAL | 1 refills | Status: DC

## 2017-09-24 MED ORDER — RAMELTEON 8 MG PO TABS: 8.0000 mg | ORAL_TABLET | Freq: Every day | ORAL | 1 refills | Status: DC

## 2017-09-24 NOTE — Progress Notes (Signed)
[place] MD/PA/NP OP Progress Note  09/24/2017 4:06 PM Crystal [name]  MRN:  [id number]  Chief Complaint: med management  HPI: Crystal [name] reports mood/anxiety under good control.  She reports that working at [employer] in [place] has made her come back to life, she feels cheerful, and has a purpose day-to-day.  She reports that the Aricept has not given her any noticeable benefits in cognition or memory, but the diarrhea has resolved.  We agreed to increase to 10 mg daily.  Again reiterated the risks and benefits associated with Aricept.  Also agreed to start a low-dose of Rozerem for her ongoing difficulties with sleep-onset insomnia.  Educated her on the potential cognitive benefits of Rozerem, and that it needs to be taken about 3 hours before bedtime.  She agrees to follow-up in 3 months.  We agreed to continue Lexapro, reiterated the increase of Aricept, and reiterated initiation of Rozerem for sleep.  Other alternatives for sleep would include low-dose trazodone, I am concerned this may contribute to confusion.  We will revisit this if there are any coverage issues with her rozerem.  Visit Diagnosis:    ICD-10-CM   1. Mild cognitive impairment G31.84 donepezil (ARICEPT) 10 MG tablet    ramelteon (ROZEREM) 8 MG tablet  2. Depression, unspecified depression type F32.9 ramelteon (ROZEREM) 8 MG tablet    escitalopram (LEXAPRO) 20 MG tablet  3. Other insomnia G47.09 ramelteon (ROZEREM) 8 MG tablet    Past Psychiatric History: See intake H&P for full details. Reviewed, with no updates at this time.  Past Medical History:  Past Medical History:  Diagnosis Date  . Acute peptic ulcer of stomach    As a teenager  . Allergy    dust, cock roach,grass,weeds,molds  . Anxiety   . Asthma    02/08/10 FEV1 1.98 (80%), s/p saba 2.22 l/m (90%).nml  . Cognitive change 06/10/2016  . Depression   . Diabetes mellitus 2003   Type II  . Diverticulosis   . Dyspepsia   . Fatty liver  12/19/2010  . GERD (gastroesophageal reflux disease)   . High cholesterol   . Hyperlipemia   . Hypertension   . Hypothyroidism 09/08/2013  . IBS (irritable bowel syndrome)   . Internal hemorrhoids   . Joint pain 08/08/2016  . Low back pain 08/10/2016  . OSA (obstructive sleep apnea)   . Plantar fasciitis   . Sun-damaged skin 12/09/2016  . Vitamin D deficiency 03/04/2016    Past Surgical History:  Procedure Laterality Date  . APPENDECTOMY     age 55  . CATARACT EXTRACTION Bilateral 2016   Dr. Bing Plume  . CATARACT EXTRACTION, BILATERAL     12/8 and 12/14  . COLONOSCOPY  06/03/2010, 08/25/2011   diverticulosis   . GASTRECTOMY     partial - ulcer as a teen  . HAND SURGERY Right   . HEMORRHOID BANDING    . KNEE ARTHROSCOPY Left 1998  . NASAL SEPTUM SURGERY    . RHINOPLASTY    . TOE SURGERY Right 1995    Family Psychiatric History: See intake H&P for full details. Reviewed, with no updates at this time.   Family History:  Family History  Problem Relation Age of Onset  . Diabetes Mother        type II  . Heart attack Mother 79  . Stroke Mother   . Colon cancer Sister   . Asthma Maternal Grandmother   . Mental retardation Son  PTSD  . Parkinsonism Father   . Cancer Sister        stage 4 bladder cancer  . Colon polyps Sister   . Ulcers Unknown        bleeding gastric  . Heart defect Unknown        child  . Other Unknown        perforated bowel, child  . Amblyopia Neg Hx   . Blindness Neg Hx   . Cataracts Neg Hx   . Glaucoma Neg Hx   . Macular degeneration Neg Hx   . Retinal detachment Neg Hx   . Strabismus Neg Hx   . Retinitis pigmentosa Neg Hx     Social History:  Social History   Socioeconomic History  . Marital status: Married    Spouse name: [name]  . Number of children: 4  . Years of education: None  . Highest education level: None  Social Needs  . Financial resource strain: None  . Food insecurity - worry: None  . Food insecurity - inability:  None  . Transportation needs - medical: None  . Transportation needs - non-medical: None  Occupational History  . Occupation: retired    Fish farm manager: [employer]  Tobacco Use  . Smoking status: Never Smoker  . Smokeless tobacco: Never Used  Substance and Sexual Activity  . Alcohol use: No    Alcohol/week: 0.0 oz  . Drug use: No  . Sexual activity: No    Comment: lives with her mother, no dietary restrictions  Other Topics Concern  . None  Social History Narrative   No caffeine drinks daily     Allergies:  Allergies  Allergen Reactions  . Compazine [Prochlorperazine Edisylate]     Comma for 3 days  . Tetanus Toxoid Other (See Comments)    convulsions  . Sulfa Antibiotics Other (See Comments)    Unknown    Metabolic Disorder Labs: Lab Results  Component Value Date   HGBA1C 6.9 09/14/2017   MPG 160 (H) 08/03/2015   MPG 151 (H) 12/27/2013   No results found for: PROLACTIN Lab Results  Component Value Date   CHOL 120 08/07/2017   TRIG 100.0 08/07/2017   HDL 44.50 08/07/2017   CHOLHDL 3 08/07/2017   VLDL 20.0 08/07/2017   LDLCALC 55 08/07/2017   LDLCALC 60 03/13/2017   Lab Results  Component Value Date   TSH 3.62 08/07/2017   TSH 2.16 06/26/2017    Therapeutic Level Labs: No results found for: LITHIUM No results found for: VALPROATE No components found for:  CBMZ  Current Medications: Current Outpatient Medications  Medication Sig Dispense Refill  . ACCU-CHEK SOFTCLIX LANCETS lancets Use as directed twice daily to check blood sugar. DX E11.9 200 each 3  . Alcohol Swabs (B-D SINGLE USE SWABS REGULAR) PADS Use as directed to check blood sugar. DX E11.9 100 each 3  . atenolol (TENORMIN) 25 MG tablet Take 1 tablet (25 mg total) by mouth daily. 90 tablet 3  . atorvastatin (LIPITOR) 80 MG tablet TAKE 1 TABLET EVERY DAY 90 tablet 2  . Blood Glucose Calibration (ACCU-CHEK AVIVA) SOLN Use as directed to check blood sugar.  DX E11.9 1 each 3  . Blood Glucose Monitoring  Suppl (ACCU-CHEK AVIVA PLUS) w/Device KIT USE AS DIRECTED TO CHECK BLOOD SUGAR. 1 kit 0  . cetirizine (ZYRTEC) 10 MG tablet Take 1 tablet (10 mg total) by mouth daily as needed for allergies. 30 tablet 11  . Cholecalciferol (VITAMIN D3) 2000 units  TABS Take 1 tablet by mouth daily.    Marland Kitchen dicyclomine (BENTYL) 10 MG capsule EVERY 4-6 HOURS AS NEEDED FOR ABD PAIN AND CRAMPING OR DIARRHEA 60 capsule 1  . donepezil (ARICEPT) 10 MG tablet Take 1 tablet (10 mg total) by mouth at bedtime. 90 tablet 1  . escitalopram (LEXAPRO) 20 MG tablet Take 1 tablet (20 mg total) by mouth daily. 90 tablet 1  . famciclovir (FAMVIR) 500 MG tablet 1000 mg po bid x 24 hours then 250 mg po bid every day 35 tablet 3  . fenofibrate 160 MG tablet Take 1 tablet (160 mg total) by mouth daily. (Patient taking differently: Take 160 mg by mouth at bedtime. ) 90 tablet 2  . fluticasone (FLONASE) 50 MCG/ACT nasal spray Place 2 sprays into both nostrils daily. 16 g 3  . glimepiride (AMARYL) 4 MG tablet Take 1 tablet (4 mg total) by mouth 2 (two) times daily. 180 tablet 1  . glucose blood (ACCU-CHEK AVIVA PLUS) test strip Use as directed twice daily to check blood sugar.  DX E11.9 200 each 3  . levothyroxine (SYNTHROID, LEVOTHROID) 25 MCG tablet TAKE 1 TABLET (25 MCG TOTAL) BY MOUTH DAILY BEFORE BREAKFAST. 90 tablet 2  . losartan (COZAAR) 50 MG tablet Take 1 tablet (50 mg total) by mouth daily. 90 tablet 1  . montelukast (SINGULAIR) 10 MG tablet Take 1 tablet (10 mg total) by mouth at bedtime as needed (allergies). 30 tablet 3  . Multiple Vitamins-Minerals (CENTRUM SILVER PO) Take 1 tablet by mouth daily.      . pantoprazole (PROTONIX) 40 MG tablet TAKE 1 TABLET EVERY DAY 90 tablet 0  . Probiotic Product (ALIGN) 4 MG CAPS Take 1 capsule by mouth daily. Reported on 07/02/2015    . sitaGLIPtin (JANUVIA) 100 MG tablet Take 1 tablet (100 mg total) by mouth daily. 30 tablet 0  . valACYclovir (VALTREX) 1000 MG tablet   6  . VENTOLIN HFA 108  (90 Base) MCG/ACT inhaler INHALE 2 PUFFS BY MOUTH INTO THE LUNGS EVERY 4 (FOUR) HOURS AS NEEDED FOR SHORTNESS OF BREATH AND WHEEZING 18 g 0  . ramelteon (ROZEREM) 8 MG tablet Take 1 tablet (8 mg total) by mouth at bedtime. 90 tablet 1   No current facility-administered medications for this visit.      Musculoskeletal: Strength & Muscle Tone: within normal limits Gait & Station: normal Patient leans: N/A  Psychiatric Specialty Exam: ROS  Blood pressure 132/80, pulse 83, height _0  (1.575 m), weight 160 lb (72.6 kg).Body mass index is 29.26 kg/m.  General Appearance: Casual and Well Groomed  Eye Contact:  Fair  Speech:  Clear and Coherent and Normal Rate  Volume:  Normal  Mood:  Euthymic  Affect:  Appropriate and Congruent  Thought Process:  Coherent, Goal Directed and Descriptions of Associations: Intact  Orientation:  Full (Time, Place, and Person)  Thought Content: Logical   Suicidal Thoughts:  No  Homicidal Thoughts:  No  Memory:  Immediate;   Fair  Judgement:  Fair  Insight:  Shallow  Psychomotor Activity:  Normal  Concentration:  Attention Span: Fair  Recall:  [employer] of Knowledge: Fair  Language: Fair  Akathisia:  Negative  Handed:  Right  AIMS (if indicated): not done  Assets:  Communication Skills Desire for Improvement Financial Resources/Insurance Housing Social Support Transportation  ADL's:  Intact  Cognition: WNL  Sleep:  Fair   Screenings: Mini-Mental     Office Visit from 10/06/2016 in [place]  [place] at [place] from 07/02/2015 in [place] at [employer] [facility]  Total Score (max 30 points )  29  28    PHQ2-9     Office Visit from 12/09/2016 in [facility] [place] Visit from 10/06/2016 in [facility] at [place] Visit from 08/08/2016 in [place] at [place] from  [facility]/13/2015 [facility] Clinical Support from 07/02/2015 in [place] at [facility]  PHQ-2 Total Score  0  1  0  0  1       Assessment and Plan: Cyniah Gossard presents with general remission of depressive and anxiety symptoms, and is tolerating Lexapro without any issues.  Her job at [employer] in [place] also seems to have given her a significant improvement and sense of purpose in terms of her mood.  She continues to have some difficulties with memory, and is agreeable to increasing Aricept to 10 mg daily.  If she does not have some reconstitution of cognition or memory on follow-up [place] cognitive assessment, then we will discontinue Aricept.  With regard to sleep, she has some difficulty with sleep onset insomnia symptoms.  We agreed to initiate Rozerem given its generally safe profile.  We will follow-up in 3 months.  1. Mild cognitive impairment   2. Depression, unspecified depression type   3. Other insomnia     Status of current problems: gradually improving  Labs Ordered: No orders of the defined types were placed in this encounter.   Labs Reviewed: N/A  Collateral Obtained/Records Reviewed: N/A  Plan:  Increase Aricept to 10 mg for neurocognitive symptoms Lexapro 20 mg daily Initiate Rozerem for insomnia, in the context of suspected mild neurocognitive disorder Return to clinic in 3 months Recommend against the use of hypnotics or benzodiazepines  I spent 20 minutes with the patient in direct face-to-face clinical care.  Greater than 50% of this time was spent in counseling and coordination of care with the patient.    Aundra Dubin, MD 09/24/2017, 4:06 PM

## 2017-09-24 NOTE — Patient Instructions (Signed)
Increase Aricept to 10 mg (1 tablet)  START Rozerem at sundown (about 3 hours before bedtime)  Lexapro 20 mg daily

## 2017-10-02 ENCOUNTER — Other Ambulatory Visit: Payer: Self-pay | Admitting: Family Medicine

## 2017-10-12 ENCOUNTER — Telehealth: Payer: Self-pay | Admitting: Family Medicine

## 2017-10-12 NOTE — Telephone Encounter (Signed)
-  Copied from [place] [id number]. Topic: Referral - Request >> Oct 12, 2017  3:29 PM Crystal [name], [place], [place] wrote: -Reason for BVQ:[id number] calling because she would like to have a referral to a dermatologist. Stated that she has a black spot on her stomach and right foot and her husband passed from skin cancer.If someone could give her a call back about this at [phone] -

## 2017-10-13 ENCOUNTER — Other Ambulatory Visit: Payer: Self-pay | Admitting: Family Medicine

## 2017-10-13 DIAGNOSIS — L578 Other skin changes due to chronic exposure to nonionizing radiation: Secondary | ICD-10-CM

## 2017-10-13 DIAGNOSIS — L989 Disorder of the skin and subcutaneous tissue, unspecified: Secondary | ICD-10-CM

## 2017-10-13 DIAGNOSIS — Z961 Presence of intraocular lens: Secondary | ICD-10-CM | POA: Diagnosis not present

## 2017-10-13 DIAGNOSIS — E119 Type 2 diabetes mellitus without complications: Secondary | ICD-10-CM | POA: Diagnosis not present

## 2017-10-13 DIAGNOSIS — H43813 Vitreous degeneration, bilateral: Secondary | ICD-10-CM | POA: Diagnosis not present

## 2017-10-13 DIAGNOSIS — H524 Presbyopia: Secondary | ICD-10-CM | POA: Diagnosis not present

## 2017-10-13 DIAGNOSIS — H35033 Hypertensive retinopathy, bilateral: Secondary | ICD-10-CM | POA: Diagnosis not present

## 2017-10-13 NOTE — Telephone Encounter (Signed)
Referral to derm  

## 2017-10-16 DIAGNOSIS — L821 Other seborrheic keratosis: Secondary | ICD-10-CM | POA: Diagnosis not present

## 2017-10-16 DIAGNOSIS — D1801 Hemangioma of skin and subcutaneous tissue: Secondary | ICD-10-CM | POA: Diagnosis not present

## 2017-10-16 DIAGNOSIS — D225 Melanocytic nevi of trunk: Secondary | ICD-10-CM | POA: Diagnosis not present

## 2017-10-27 ENCOUNTER — Telehealth: Payer: Self-pay | Admitting: Internal Medicine

## 2017-10-27 NOTE — Telephone Encounter (Signed)
Patient reports that diarrhea for the last 2 weeks is uncontrollable. She is incontinent.  Imodium not helping.  She is still taking Aricept.  No recent travel, antibiotics, or sick contacts.  Multiple episodes a day.  Dr. Carlean Purl, she said you two discussed the next step, but her symptoms had calmd at the last office visit and you were going to hold off. Please advise

## 2017-10-27 NOTE — Telephone Encounter (Signed)
Pt stated that she still has diarrhea and is till not feeling well. She wants to know what the next step in treatment is.

## 2017-10-28 NOTE — Telephone Encounter (Signed)
I told her to hold Aricept and see what that does She will call me back with an update

## 2017-11-03 ENCOUNTER — Other Ambulatory Visit: Payer: Self-pay | Admitting: Family Medicine

## 2017-11-17 ENCOUNTER — Encounter: Payer: Self-pay | Admitting: Emergency Medicine

## 2017-11-17 ENCOUNTER — Emergency Department (INDEPENDENT_AMBULATORY_CARE_PROVIDER_SITE_OTHER)
Admission: EM | Admit: 2017-11-17 | Discharge: 2017-11-17 | Disposition: A | Discharge status: Home / Self Care | Payer: Medicare HMO | Source: Home / Self Care | Attending: Family Medicine | Admitting: Family Medicine

## 2017-11-17 ENCOUNTER — Other Ambulatory Visit: Payer: Self-pay | Admitting: *Deleted

## 2017-11-17 DIAGNOSIS — R197 Diarrhea, unspecified: Secondary | ICD-10-CM | POA: Diagnosis not present

## 2017-11-17 DIAGNOSIS — R112 Nausea with vomiting, unspecified: Secondary | ICD-10-CM | POA: Diagnosis not present

## 2017-11-17 LAB — POCT FASTING CBG KUC MANUAL ENTRY: POCT GLUCOSE (MANUAL ENTRY) KUC: 151 mg/dL — AB (ref 70–99)

## 2017-11-17 MED ORDER — ONDANSETRON 4 MG PO TBDP: ORAL_TABLET | ORAL | 0 refills | Status: DC

## 2017-11-17 MED ORDER — SITAGLIPTIN PHOSPHATE 100 MG PO TABS: 100.0000 mg | ORAL_TABLET | Freq: Every day | ORAL | 0 refills | Status: DC

## 2017-11-17 MED ORDER — ONDANSETRON 4 MG PO TBDP
4.0000 mg | ORAL_TABLET | Freq: Once | ORAL | Status: AC
Start: 2017-11-17 — End: 2017-11-17
  Administered 2017-11-17: 4 mg via ORAL

## 2017-11-17 MED FILL — JANUVIA 100 MG TABLET: 100 | 30 days supply | Qty: 30 | Fill #0

## 2017-11-17 MED FILL — ONDANSETRON ODT 4 MG TABLET: 4 | 4 days supply | Qty: 12 | Fill #0

## 2017-11-17 NOTE — ED Provider Notes (Signed)
[name]    CSN: [id number] Arrival date & time: 11/17/17  1445     History   Chief Complaint Chief Complaint  Patient presents with  . Nausea  . Emesis    HPI Crystal [name] is a 67 y.o. female.   Two days ago after eating a "sausage dog," patient developed nausea, diarrhea, and chills.  Yesterday she had several episodes of nausea/vomiting.  She continues to have crampy abdominal discomfort and nausea.  No urinary symptoms.  Denies recent foreign travel, or drinking untreated water in a wilderness environment.  She denies recent antibiotic use.   The history is provided by the patient.  Emesis  Severity:  Mild Duration:  2 days Timing:  Intermittent Quality:  Stomach contents Able to tolerate:  Liquids Progression:  Improving Chronicity:  New Recent urination:  Decreased Relieved by:  None tried Exacerbated by: eating. Ineffective treatments:  None tried Associated symptoms: abdominal pain, chills and diarrhea   Associated symptoms: no arthralgias, no cough, no fever, no headaches, no myalgias, no sore throat and no URI   Risk factors: prior abdominal surgery and suspect food intake   Risk factors: no sick contacts and no travel to endemic areas     Past Medical History:  Diagnosis Date  . Acute peptic ulcer of stomach    As a teenager  . Allergy    dust, cock roach,grass,weeds,molds  . Anxiety   . Asthma    02/08/10 FEV1 1.98 (80%), s/p saba 2.22 l/m (90%).nml  . Cognitive change 06/10/2016  . Depression   . Diabetes mellitus 2003   Type II  . Diverticulosis   . Dyspepsia   . Fatty liver 12/19/2010  . GERD (gastroesophageal reflux disease)   . High cholesterol   . Hyperlipemia   . Hypertension   . Hypothyroidism 09/08/2013  . IBS (irritable bowel syndrome)   . Internal hemorrhoids   . Joint pain 08/08/2016  . Low back pain 08/10/2016  . OSA (obstructive sleep apnea)   . Plantar fasciitis   . Sun-damaged skin 12/09/2016  . Vitamin D  deficiency 03/04/2016    Patient Active Problem List   Diagnosis Date Noted  . Burn 08/07/2017  . Tremor 06/26/2017  . Left knee pain 06/18/2017  . Right knee pain 12/11/2016  . 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 Sun-damaged skin 12/09/2016  . 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 Mild cognitive impairment 08/21/2016  . Mild single current episode of major depressive disorder ([place]) 08/21/2016  . Low back pain 08/10/2016  . Joint pain 08/08/2016  . Prolapsed internal hemorrhoids, grade 2 06/18/2016  . Cognitive change 06/10/2016  . Family history of colon cancer 05/19/2016  . Vitamin D deficiency 03/04/2016  . Deformity of metatarsal bone of right foot 08/15/2015  . Metatarsalgia of right foot 08/15/2015  . Otalgia 04/02/2015  . Preventative health care 06/25/2014  . Sinusitis 12/09/2013  . Osteopenia 09/19/2013  . Hypothyroidism 09/08/2013  . Osteoarthritis, knee 09/06/2013  . Routine general medical examination at a health care facility 09/05/2013  . Dysphagia, unspecified(787.20) 07/19/2013  . Allergic rhinitis 11/16/2012  . Osteoarthritis of both hands 10/15/2012  . Peripheral neuropathy 12/19/2011  . 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 GERD (gastroesophageal reflux disease) 03/07/2011  . Fatty liver 12/19/2010  . Genital herpes 08/06/2010  . MAMMOGRAM, ABNORMAL 08/02/2010  . ABNORMAL ELECTROCARDIOGRAM 07/19/2010  . Obstructive sleep apnea 06/18/2010  . DM (diabetes mellitus), type 2 with neurological  complications ([place]) 29/93/7169  . Hyperlipidemia 04/12/2010  . Essential hypertension 04/12/2010  . ASTHMA 04/12/2010  . DEPRESSION/ANXIETY 03/13/2010    Past Surgical History:  Procedure Laterality Date  . APPENDECTOMY     age 63  . CATARACT EXTRACTION Bilateral 2016   Dr. Bing Plume  . CATARACT EXTRACTION, BILATERAL     12/8 and 12/14  . COLONOSCOPY  06/03/2010, 08/25/2011   diverticulosis   . GASTRECTOMY     partial  - ulcer as a teen  . HAND SURGERY Right   . HEMORRHOID BANDING    . KNEE ARTHROSCOPY Left 1998  . NASAL SEPTUM SURGERY    . RHINOPLASTY    . TOE SURGERY Right 1995    OB History    Gravida  4   Para  4   Term      Preterm      AB      Living        SAB      TAB      Ectopic      Multiple      Live Births           Obstetric Comments  1 child died at 3 weeks (heart defects, perforated bowel)         Home Medications    Prior to Admission medications   Medication Sig Start Date End Date Taking? Authorizing Provider  ACCU-CHEK SOFTCLIX LANCETS lancets Use as directed twice daily to check blood sugar. DX E11.9 07/15/17   [name], MD  Alcohol Swabs (B-D SINGLE USE SWABS REGULAR) PADS Use as directed to check blood sugar. DX E11.9 07/15/17   [name], MD  atenolol (TENORMIN) 25 MG tablet Take 1 tablet (25 mg total) by mouth daily. 06/26/17   [name], MD  atorvastatin (LIPITOR) 80 MG tablet TAKE 1 TABLET EVERY DAY 04/06/17   [name], MD  Blood Glucose Calibration (ACCU-CHEK AVIVA) SOLN Use as directed to check blood sugar.  DX E11.9 07/15/17   [name], MD  Blood Glucose Monitoring Suppl (ACCU-CHEK AVIVA PLUS) w/Device KIT USE AS DIRECTED TO CHECK BLOOD SUGAR. 07/27/17   [name], MD  cetirizine (ZYRTEC) 10 MG tablet Take 1 tablet (10 mg total) by mouth daily as needed for allergies. 05/07/17   [name], MD  Cholecalciferol (VITAMIN D3) 2000 units TABS Take 1 tablet by mouth daily.    [provider]  dicyclomine (BENTYL) 10 MG capsule EVERY 4-6 HOURS AS NEEDED FOR ABD PAIN AND CRAMPING OR DIARRHEA 07/29/17   Levin Erp, PA  donepezil (ARICEPT) 10 MG tablet Take 1 tablet (10 mg total) by mouth at bedtime. 09/24/17 09/24/18  Aundra Dubin, MD  escitalopram (LEXAPRO) 20 MG tablet Take 1 tablet (20 mg total) by mouth daily. 09/24/17   Aundra Dubin, MD  famciclovir [facility]) 500 MG tablet 1000  mg po bid x 24 hours then 250 mg po bid every day 12/09/16   [name], MD  fenofibrate 160 MG tablet TAKE 1 TABLET (160 MG TOTAL) BY MOUTH DAILY. 10/02/17   [name], MD  fluticasone (FLONASE) 50 MCG/ACT nasal spray Place 2 sprays into both nostrils daily. 05/07/17   [name], MD  glimepiride (AMARYL) 4 MG tablet Take 1 tablet (4 mg total) by mouth 2 (two) times daily. 03/13/17   [name], MD  glucose blood (ACCU-CHEK AVIVA PLUS) test strip Use as directed twice  daily to check blood sugar.  DX E11.9 07/15/17   [name], MD  levothyroxine (SYNTHROID, LEVOTHROID) 25 MCG tablet TAKE 1 TABLET (25 MCG TOTAL) BY MOUTH DAILY BEFORE BREAKFAST. 07/27/17   [name], MD  losartan (COZAAR) 50 MG tablet TAKE 1 TABLET EVERY DAY 11/04/17   [name], MD  montelukast (SINGULAIR) 10 MG tablet Take 1 tablet (10 mg total) by mouth at bedtime as needed (allergies). 07/15/17   [name], MD  Multiple Vitamins-Minerals (CENTRUM SILVER PO) Take 1 tablet by mouth daily.      [provider]  ondansetron (ZOFRAN ODT) 4 MG disintegrating tablet Take one tab by mouth Q6hr prn nausea.  Dissolve under tongue. 11/17/17   Kandra Nicolas, MD  pantoprazole (PROTONIX) 40 MG tablet TAKE 1 TABLET EVERY DAY 07/15/17   [name], MD  Probiotic Product (ALIGN) 4 MG CAPS Take 1 capsule by mouth daily. Reported on 07/02/2015    [provider]  ramelteon (ROZEREM) 8 MG tablet Take 1 tablet (8 mg total) by mouth at bedtime. 09/24/17   Eksir, Richard Miu, MD  sitaGLIPtin (JANUVIA) 100 MG tablet Take 1 tablet (100 mg total) by mouth daily. 11/17/17   Philemon Kingdom, MD  valACYclovir (VALTREX) 1000 MG tablet  02/02/17   [provider]  VENTOLIN HFA 108 (90 Base) MCG/ACT inhaler INHALE 2 PUFFS BY MOUTH INTO THE LUNGS EVERY 4 (FOUR) HOURS AS NEEDED FOR SHORTNESS OF BREATH AND WHEEZING 05/13/16   [name], MD    Family History Family History  Problem Relation  Age of Onset  . Diabetes Mother        type II  . Heart attack Mother 55  . Stroke Mother   . Colon cancer Sister   . Asthma Maternal Grandmother   . Mental retardation Son        PTSD  . Parkinsonism Father   . Cancer Sister        stage 4 bladder cancer  . Colon polyps Sister   . Ulcers Unknown        bleeding gastric  . Heart defect Unknown        child  . Other Unknown        perforated bowel, child  . Amblyopia Neg Hx   . Blindness Neg Hx   . Cataracts Neg Hx   . Glaucoma Neg Hx   . Macular degeneration Neg Hx   . Retinal detachment Neg Hx   . Strabismus Neg Hx   . Retinitis pigmentosa Neg Hx     Social History Social History   Tobacco Use  . Smoking status: Never Smoker  . Smokeless tobacco: Never Used  Substance Use Topics  . Alcohol use: No    Alcohol/week: 0.0 oz  . Drug use: No     Allergies   Compazine [prochlorperazine edisylate]; Tetanus toxoid; and Sulfa antibiotics   Review of Systems Review of Systems  Constitutional: Positive for chills. Negative for fever.  HENT: Negative for sore throat.   Respiratory: Negative for cough.   Gastrointestinal: Positive for abdominal pain, diarrhea and vomiting.  Musculoskeletal: Negative for arthralgias and myalgias.  Neurological: Negative for headaches.  All other systems reviewed and are negative.    Physical Exam Triage Vital Signs ED Triage Vitals [11/17/17 1506]  Enc Vitals Group     BP (!) 174/88     Pulse Rate 99     Resp  Temp 98.2 F (36.8 C)     Temp Source Oral     SpO2 98 %     Weight 152 lb (68.9 kg)     Height '5\' 2"'  (1.575 m)     Head Circumference      Peak Flow      Pain Score 8     Pain Loc      Pain Edu?      Excl. in [place]?    No data found.  Updated Vital Signs BP (!) 174/88 (BP Location: Right Arm)   Pulse 99   Temp 98.2 F (36.8 C) (Oral)   Ht '5\' 2"'  (1.575 m)   Wt 152 lb (68.9 kg)   SpO2 98%   BMI 27.80 kg/m   Visual Acuity Right Eye Distance:   Left  Eye Distance:   Bilateral Distance:    Right Eye Near:   Left Eye Near:    Bilateral Near:     Physical Exam Nursing notes and Vital Signs reviewed. Appearance:  Patient appears stated age, and in no acute distress.    Eyes:  Pupils are equal, round, and reactive to light and accomodation.  Extraocular movement is intact.  Conjunctivae are not inflamed   Pharynx:  Normal; moist mucous membranes  Neck:  Supple.  No adenopathy Lungs:  Clear to auscultation.  Breath sounds are equal.  Moving air well. Heart:  Regular rate and rhythm without murmurs, rubs, or gallops.  Abdomen:  Nontender without masses or hepatosplenomegaly.  Bowel sounds are present but decreased.  No CVA or flank tenderness.  Extremities:  No edema.  Skin:  No rash present.     UC Treatments / Results  Labs (all labs ordered are listed, but only abnormal results are displayed) Labs Reviewed  POCT FASTING [facility] - Abnormal; Notable for the following components:      Result Value   POCT Glucose ([facility]) 151 (*)    All other components within normal limits  POCT CBC W AUTO DIFF ([facility]):  WBC 8.5; LY 24.5; MO 2.7; GR 72.8; Hgb 14.2; Platelets 239     EKG None  Radiology No results found.  Procedures Procedures (including critical care time)  Medications Ordered in UC Medications  ondansetron (ZOFRAN-ODT) disintegrating tablet 4 mg (4 mg Oral Given 11/17/17 1516)    Initial Impression / Assessment and Plan / UC Course  I have reviewed the triage vital signs and the nursing notes.  Pertinent labs & imaging results that were available during my care of the patient were reviewed by me and considered in my medical decision making (see chart for details).    Benign exam and normal WBC. ?viral gastroenteritis Administered Zofran ODT 72m PO; given Rx for same. Followup with Family Doctor if not improved in about three days.   Final Clinical Impressions(s) / UC Diagnoses   Final  diagnoses:  Nausea vomiting and diarrhea     Discharge Instructions     Begin clear liquids (Pedialyte while having diarrhea) until improved, then advance to a [employer](Bananas, Rice, Applesauce, Toast).  Then gradually resume a regular diet when tolerated.  Avoid milk products until well.    If symptoms become significantly worse during the night or over the weekend, proceed to the local emergency room.     ED Prescriptions    Medication Sig Dispense Auth. Provider   ondansetron (ZOFRAN ODT) 4 MG disintegrating tablet Take one tab by mouth Q6hr prn  nausea.  Dissolve under tongue. 12 tablet Kandra Nicolas, MD        Kandra Nicolas, MD 11/19/17 1049

## 2017-11-17 NOTE — ED Notes (Signed)
Given sprite zero and ice chips. Tolerated well.

## 2017-11-17 NOTE — ED Triage Notes (Signed)
67 y.o female complains of abd cramping nausea and vomiting. She states this started Saturday after attending to [employer] and eating a sausage dog. She states she feels "shakey" and is unable to keep anything down.

## 2017-11-17 NOTE — Discharge Instructions (Addendum)
Begin clear liquids (Pedialyte while having diarrhea) until improved, then advance to a BRAT diet (Bananas, Rice, Applesauce, Toast).  Then gradually resume a regular diet when tolerated.  Avoid milk products until well.  ° °If symptoms become significantly worse during the night or over the weekend, proceed to the local emergency room. ° °

## 2017-11-20 ENCOUNTER — Telehealth (INDEPENDENT_AMBULATORY_CARE_PROVIDER_SITE_OTHER): Payer: Medicare HMO

## 2017-11-20 DIAGNOSIS — R112 Nausea with vomiting, unspecified: Secondary | ICD-10-CM

## 2017-11-20 DIAGNOSIS — R197 Diarrhea, unspecified: Secondary | ICD-10-CM

## 2017-11-20 LAB — POCT CBC W AUTO DIFF (K'VILLE URGENT CARE)

## 2017-11-25 ENCOUNTER — Other Ambulatory Visit: Payer: Self-pay

## 2017-11-27 ENCOUNTER — Other Ambulatory Visit: Payer: Self-pay | Admitting: Family Medicine

## 2017-11-27 ENCOUNTER — Telehealth: Payer: Self-pay | Admitting: Family Medicine

## 2017-11-27 ENCOUNTER — Encounter: Payer: Self-pay | Admitting: Family Medicine

## 2017-11-27 MED ORDER — GLIMEPIRIDE 4 MG PO TABS: 4.0000 mg | ORAL_TABLET | Freq: Two times a day (BID) | ORAL | 1 refills | Status: DC

## 2017-11-27 MED FILL — GLIMEPIRIDE 4 MG TABLET: 4 | 90 days supply | Qty: 180 | Fill #0

## 2017-11-27 NOTE — Telephone Encounter (Signed)
Copied from [place] [id number]. Topic: Quick Communication - Rx Refill/Question >> Nov 27, 2017  2:25 PM Tye [place] wrote: Pt is completely out and is going out of town tomorrow   Medication: glimepiride (AMARYL) 4 MG tablet [[id number]]  Has the patient contacted their pharmacy? Yes.   (Agent: If no, request that the patient contact the pharmacy for the refill.) (Agent: If yes, when and what did the pharmacy advise?)  Preferred Pharmacy (with phone number or street name): [facility]  Agent: Please be advised that RX refills may take up to 3 business days. We ask that you follow-up with your pharmacy.

## 2017-11-27 NOTE — Telephone Encounter (Signed)
Request refill glimepiride (Amarly) 4 MG tab LOV 08/07/17 PCP Dr. Charlett Blake

## 2017-12-17 ENCOUNTER — Ambulatory Visit: Payer: Medicare HMO | Admitting: Internal Medicine

## 2017-12-17 ENCOUNTER — Encounter: Payer: Self-pay | Admitting: Internal Medicine

## 2017-12-17 VITALS — BP 128/80 | HR 78 | Ht 62.5 in | Wt 158.2 lb

## 2017-12-17 DIAGNOSIS — E039 Hypothyroidism, unspecified: Secondary | ICD-10-CM | POA: Diagnosis not present

## 2017-12-17 DIAGNOSIS — E785 Hyperlipidemia, unspecified: Secondary | ICD-10-CM

## 2017-12-17 DIAGNOSIS — G629 Polyneuropathy, unspecified: Secondary | ICD-10-CM

## 2017-12-17 DIAGNOSIS — E1149 Type 2 diabetes mellitus with other diabetic neurological complication: Secondary | ICD-10-CM

## 2017-12-17 LAB — POCT GLYCOSYLATED HEMOGLOBIN (HGB A1C): Hemoglobin A1C: 6.8 % — AB (ref 4.0–5.6)

## 2017-12-17 NOTE — Progress Notes (Signed)
Patient ID: Crystal [name], female   DOB: 08-Dec-1950, 67 y.o.   MRN: [id number]   HPI: Crystal [name] is a 67 y.o.-year-old female, returning for follow-up for DM2, dx in 2003, non-insulin-dependent, uncontrolled, with complications (PN, DR).  Last visit 3 months ago.  Last hemoglobin A1c was: Lab Results  Component Value Date   HGBA1C 6.9 09/14/2017   HGBA1C 7.8 (H) 08/07/2017   HGBA1C 7.1 06/15/2017   Pt is on a regimen of: - Amaryl 4 mg before breakfast (2 mg before a lighter breakfast or if she is active during the day) - uses one or the other dose (50-50%) - Januvia 100 mg before breakfast She had to stop Jardiance in the past because of increased IBS symptoms and also yeast infections. Metformin gave her diarrhea in the past.  Januvia was too expensive in the past Invokana >> cannot remember why stopped  Pt checks her sugars 1X a day: - am: 120-125 >> 57, 105-145, 157 >> 85-115 >> 85-116 - 2h after b'fast: 199 >> n/c - before lunch: n/c - 2h after lunch: 190-240s, 275 >> 127-135 >> 120-140s >> n/c - before dinner: n/c - 2h after dinner: n/c - bedtime: 115-140  >> 135-157 >> 120-140s >> 135-150 - nighttime: n/c Lowest sugar was 57 x2 if lighter meal >> 85; she has hypoglycemia awareness in the 80s. Highest sugar was 187 (forgot med) >> 150  Glucometer: AccuChek [employer]iva  Pt's meals are: - Breakfast: bowl of cereals or bacon + egg + toast or egg sandwich or biscuit + gravy + hash browns; Still drinks OJ or milk. - Lunch: leftovers from dinner or tuna + salad dressing + tuna; box of mac and cheese + tuna; raman noodles; cheese + fruit - Dinner: may eat out (chinese)   -No CKD, last BUN/creatinine:  Lab Results  Component Value Date   BUN 16 08/07/2017   BUN 12 03/13/2017   CREATININE 0.71 08/07/2017   CREATININE 0.71 03/13/2017  On losartan 50. -+ HL; last set of lipids: Lab Results  Component Value Date   CHOL 120 08/07/2017   HDL 44.50 08/07/2017   LDLCALC 55 08/07/2017   LDLDIRECT 123.0 09/18/2014   TRIG 100.0 08/07/2017   CHOLHDL 3 08/07/2017  On Lipitor 80, fenofibrate 160. - last eye exam was in 09/2017: + DR. Sees retina specialist: Dr. Coralyn Pear. - No numbness and tingling in her feet.  She had numbness in fingertips, resolved. Sees podiatry - Dr. Jacqualyn Posey. . Will see him soon for calluses.  Pt also has mild cognitive impairment.  ROS: Constitutional: no weight gain/no weight loss, no fatigue, no subjective hyperthermia, no subjective hypothermia Eyes: no blurry vision, no xerophthalmia ENT: no sore throat, no nodules palpated in throat, no dysphagia, no odynophagia, no hoarseness Cardiovascular: no CP/no SOB/no palpitations/no leg swelling Respiratory: no cough/no SOB/no wheezing Gastrointestinal: no N/no V/no D/no C/no acid reflux Musculoskeletal: no muscle aches/no joint aches Skin: no rashes, no hair loss Neurological: no tremors/no numbness/no tingling/no dizziness  I reviewed pt's medications, allergies, PMH, social hx, family hx, and changes were documented in the history of present illness. Otherwise, unchanged from my initial visit note.  Past Medical History:  Diagnosis Date  . Acute peptic ulcer of stomach    As a teenager  . Allergy    dust, cock roach,grass,weeds,molds  . Anxiety   . Asthma    02/08/10 FEV1 1.98 (80%), s/p saba 2.22 l/m (90%).nml  . Cognitive change 06/10/2016  . Depression   .  Diabetes mellitus 2003   Type II  . Diverticulosis   . Dyspepsia   . Fatty liver 12/19/2010  . GERD (gastroesophageal reflux disease)   . High cholesterol   . Hyperlipemia   . Hypertension   . Hypothyroidism 09/08/2013  . IBS (irritable bowel syndrome)   . Internal hemorrhoids   . Joint pain 08/08/2016  . Low back pain 08/10/2016  . OSA (obstructive sleep apnea)   . Plantar fasciitis   . Sun-damaged skin 12/09/2016  . Vitamin D deficiency 03/04/2016   Past Surgical History:  Procedure Laterality Date  .  APPENDECTOMY     age 63  . CATARACT EXTRACTION Bilateral 2016   Dr. Bing Plume  . CATARACT EXTRACTION, BILATERAL     12/8 and 12/14  . COLONOSCOPY  06/03/2010, 08/25/2011   diverticulosis   . GASTRECTOMY     partial - ulcer as a teen  . HAND SURGERY Right   . HEMORRHOID BANDING    . KNEE ARTHROSCOPY Left 1998  . NASAL SEPTUM SURGERY    . RHINOPLASTY    . TOE SURGERY Right 1995   Social History   Social History  . Marital status: widowed    Spouse name: [name]  . Number of children: 4  . Years of education: N/A   Occupational History  . retired Orthoptist   Social History Main Topics  . Smoking status: Never Smoker  . Smokeless tobacco: Never Used  . Alcohol use No  . Drug use: No  . Sexual activity: No     Comment: lives with her mother, no dietary restrictions   Other Topics Concern  . Not on file   Social History Narrative   No caffeine drinks daily    Current Outpatient Medications on File Prior to Visit  Medication Sig Dispense Refill  . ACCU-CHEK SOFTCLIX LANCETS lancets Use as directed twice daily to check blood sugar. DX E11.9 200 each 3  . Alcohol Swabs (B-D SINGLE USE SWABS REGULAR) PADS Use as directed to check blood sugar. DX E11.9 100 each 3  . atenolol (TENORMIN) 25 MG tablet Take 1 tablet (25 mg total) by mouth daily. 90 tablet 3  . atorvastatin (LIPITOR) 80 MG tablet TAKE 1 TABLET EVERY DAY 90 tablet 2  . Blood Glucose Calibration (ACCU-CHEK AVIVA) SOLN Use as directed to check blood sugar.  DX E11.9 1 each 3  . Blood Glucose Monitoring Suppl (ACCU-CHEK AVIVA PLUS) w/Device KIT USE AS DIRECTED TO CHECK BLOOD SUGAR. 1 kit 0  . cetirizine (ZYRTEC) 10 MG tablet Take 1 tablet (10 mg total) by mouth daily as needed for allergies. 30 tablet 11  . Cholecalciferol (VITAMIN D3) 2000 units TABS Take 1 tablet by mouth daily.    Marland Kitchen dicyclomine (BENTYL) 10 MG capsule EVERY 4-6 HOURS AS NEEDED FOR ABD PAIN AND CRAMPING OR DIARRHEA 60 capsule 1  . donepezil (ARICEPT) 10 MG  tablet Take 1 tablet (10 mg total) by mouth at bedtime. 90 tablet 1  . escitalopram (LEXAPRO) 20 MG tablet Take 1 tablet (20 mg total) by mouth daily. 90 tablet 1  . famciclovir (FAMVIR) 500 MG tablet 1000 mg po bid x 24 hours then 250 mg po bid every day 35 tablet 3  . fenofibrate 160 MG tablet TAKE 1 TABLET (160 MG TOTAL) BY MOUTH DAILY. 90 tablet 2  . fluticasone (FLONASE) 50 MCG/ACT nasal spray Place 2 sprays into both nostrils daily. 16 g 3  . glimepiride (AMARYL) 2 MG tablet TAKE 1 TABLET (  2 MG TOTAL) BY MOUTH 2 (TWO) TIMES DAILY. 180 tablet 1  . glimepiride (AMARYL) 4 MG tablet Take 1 tablet (4 mg total) by mouth 2 (two) times daily. 180 tablet 1  . glucose blood (ACCU-CHEK AVIVA PLUS) test strip Use as directed twice daily to check blood sugar.  DX E11.9 200 each 3  . levothyroxine (SYNTHROID, LEVOTHROID) 25 MCG tablet TAKE 1 TABLET (25 MCG TOTAL) BY MOUTH DAILY BEFORE BREAKFAST. 90 tablet 2  . losartan (COZAAR) 50 MG tablet TAKE 1 TABLET EVERY DAY 90 tablet 1  . montelukast (SINGULAIR) 10 MG tablet Take 1 tablet (10 mg total) by mouth at bedtime as needed (allergies). 30 tablet 3  . Multiple Vitamins-Minerals (CENTRUM SILVER PO) Take 1 tablet by mouth daily.      . ondansetron (ZOFRAN ODT) 4 MG disintegrating tablet Take one tab by mouth Q6hr prn nausea.  Dissolve under tongue. 12 tablet 0  . pantoprazole (PROTONIX) 40 MG tablet TAKE 1 TABLET EVERY DAY 90 tablet 0  . Probiotic Product (ALIGN) 4 MG CAPS Take 1 capsule by mouth daily. Reported on 07/02/2015    . ramelteon (ROZEREM) 8 MG tablet Take 1 tablet (8 mg total) by mouth at bedtime. 90 tablet 1  . sitaGLIPtin (JANUVIA) 100 MG tablet Take 1 tablet (100 mg total) by mouth daily. 90 tablet 0  . valACYclovir (VALTREX) 1000 MG tablet   6  . VENTOLIN HFA 108 (90 Base) MCG/ACT inhaler INHALE 2 PUFFS BY MOUTH INTO THE LUNGS EVERY 4 (FOUR) HOURS AS NEEDED FOR SHORTNESS OF BREATH AND WHEEZING 18 g 0   No current facility-administered  medications on file prior to visit.    Allergies  Allergen Reactions  . Compazine [Prochlorperazine Edisylate]     Comma for 3 days  . Tetanus Toxoid Other (See Comments)    convulsions  . Sulfa Antibiotics Other (See Comments)    Unknown   Family History  Problem Relation Age of Onset  . Diabetes Mother        type II  . Heart attack Mother 47  . Stroke Mother   . Colon cancer Sister   . Asthma Maternal Grandmother   . Mental retardation Son        PTSD  . Parkinsonism Father   . Cancer Sister        stage 4 bladder cancer  . Colon polyps Sister   . Ulcers Unknown        bleeding gastric  . Heart defect Unknown        child  . Other Unknown        perforated bowel, child  . Amblyopia Neg Hx   . Blindness Neg Hx   . Cataracts Neg Hx   . Glaucoma Neg Hx   . Macular degeneration Neg Hx   . Retinal detachment Neg Hx   . Strabismus Neg Hx   . Retinitis pigmentosa Neg Hx    Pt has FH of DM in mother.  PE: BP 128/80   Pulse 78   Ht 5' 2.5" (1.588 m)   Wt 158 lb 3.2 oz (71.8 kg)   SpO2 95%   BMI 28.47 kg/m  Wt Readings from Last 3 Encounters:  12/17/17 158 lb 3.2 oz (71.8 kg)  11/17/17 152 lb (68.9 kg)  09/14/17 158 lb 9.6 oz (71.9 kg)   Constitutional: overweight, in NAD Eyes: + Right surgical pupil, EOMI, no exophthalmos ENT: moist mucous membranes, no thyromegaly, no cervical lymphadenopathy Cardiovascular: RRR,  No MRG Respiratory: CTA B Gastrointestinal: abdomen soft, NT, ND, BS+ Musculoskeletal: no deformities, strength intact in all 4 Skin: moist, warm, no rashes Neurological: no tremor with outstretched hands, DTR normal in all 4   ASSESSMENT: 1. DM2, non-insulin-dependent, uncontrolled, with complications - PN - DR  She saw nutrition in the past: "I know what I need to do, but I just cannot do it"  We are limited in our options for treatment by the cost of the medicines.  At the previous visit, we called her pharmacy with patient in the room  and almost all the medicines that we could use were more than $100 per month month for her...  2. HL  3.  Numbness in fingers  PLAN:  1. Patient with long-standing, uncontrolled, type 2 diabetes, on oral antidiabetic regimen with glimepiride and Januvia.  We discussed at last visits about healthier ways to improve her diet and, at last visit, her sugars are much better despite having to stop Jardiance due to side effects.  At that time, almost all her sugars were at goal, except for some lows before lunch if she had a lighter breakfast or was more active during the morning.  I advised her to decrease the Amaryl dose in those situations. - she started to do so without hyper- or hypoglycemia. She uses one or the other dose 50-50% of the time. Will continue this for now. We may be able to stop Amaryl in the future. - I advised her to: Patient Instructions  Please continue: - Glimepiride 4 mg before b'fast.  Decrease the dose to 2 mg if you have a lighter meal or if you plan to be active after the meal.   - Januvia 100 mg before b'fast  Please return in 4 months with your sugar log.   - today, HbA1c is 6.8% (slightly better) - continue checking sugars at different times of the day - check 1x a day, rotating checks - advised for yearly eye exams >> she is UTD - Return to clinic in  4 mo with sugar log - she prefers to come back in 6 mo  2. HL - Reviewed latest lipid panel From 07/2017: LDL much improved since 2016, triglycerides normal - Continues  Lipitor 80 and fenofibrate 160 without side effects.  3. Numbness in fingers - A B12 level was normal.   - I suggested alpha lipoic acid and B complex but she did not start this. - Her symptoms improved after cutting back on coffee.  Philemon Kingdom, MD PhD [facility]

## 2017-12-17 NOTE — Patient Instructions (Signed)
Please continue: - Glimepiride 2-4 mg before b'fast - Januvia 100 mg before b'fast  Please return in 4 months with your sugar log.

## 2017-12-31 ENCOUNTER — Encounter: Payer: Self-pay | Admitting: Podiatry

## 2017-12-31 ENCOUNTER — Ambulatory Visit (INDEPENDENT_AMBULATORY_CARE_PROVIDER_SITE_OTHER): Payer: Medicare HMO | Admitting: Podiatry

## 2017-12-31 DIAGNOSIS — Q828 Other specified congenital malformations of skin: Secondary | ICD-10-CM

## 2017-12-31 DIAGNOSIS — E1149 Type 2 diabetes mellitus with other diabetic neurological complication: Secondary | ICD-10-CM | POA: Diagnosis not present

## 2018-01-03 NOTE — Progress Notes (Signed)
Subjective: Crystal [name] presents the office today for concerns of painful calluses on the ball of the right foot.  She states that they have gotten bigger and causing discomfort.  Denies any redness or drainage or any swelling.  She does try to keep moisturizer on the areas. Denies any systemic complaints such as fevers, chills, nausea, vomiting. No acute changes since last appointment, and no other complaints at this time.   Objective: AAO x3, NAD DP/PT pulses palpable bilaterally, CRT less than 3 seconds Thick hyperkeratotic lesions right foot submetatarsal 2, 3.  Upon debridement there is no underlying ulceration, drainage or any signs of infection.  There is prominence of metatarsal heads plantarly with atrophy of the fat pad.  No other areas of tenderness.  No open lesions or pre-ulcerative lesions.  No pain with calf compression, swelling, warmth, erythema  Assessment: Keratotic lesions  Plan: -All treatment options discussed with the patient including all alternatives, risks, complications.  -I sharply debrided the hyperkeratotic lesions x2 without any complications or bleeding.  Continue moisturizer and offloading all times.  Again we discussed surgical intervention however she cannot do that at this point.  Also discussed is not a guarantee resolution of symptoms where she has a chance of transfer lesions. -Patient encouraged to call the office with any questions, concerns, change in symptoms.   Trula Slade DPM

## 2018-01-06 ENCOUNTER — Telehealth: Payer: Self-pay | Admitting: Family Medicine

## 2018-01-06 NOTE — Telephone Encounter (Signed)
Received call from patient regarding Emmi call for AWV. Returned call to patient to schedule wellness visit, but phone was disconnected. Pt will need to give office a call to schedule her wellness visit. SF.

## 2018-01-18 ENCOUNTER — Encounter: Payer: Self-pay | Admitting: Family Medicine

## 2018-01-18 ENCOUNTER — Other Ambulatory Visit: Payer: Self-pay | Admitting: Physician Assistant

## 2018-01-18 MED FILL — JANUVIA 100 MG TABLET: 100 | 30 days supply | Qty: 30 | Fill #0

## 2018-01-19 MED FILL — DICYCLOMINE 10 MG CAPSULE: 10 | 10 days supply | Qty: 60 | Fill #0

## 2018-01-25 ENCOUNTER — Ambulatory Visit (HOSPITAL_COMMUNITY): Payer: Self-pay | Admitting: Psychiatry

## 2018-01-25 ENCOUNTER — Ambulatory Visit (HOSPITAL_COMMUNITY): Payer: Medicare HMO | Admitting: Psychiatry

## 2018-02-04 ENCOUNTER — Encounter: Payer: Self-pay | Admitting: Family Medicine

## 2018-02-17 ENCOUNTER — Other Ambulatory Visit: Payer: Self-pay | Admitting: Family Medicine

## 2018-02-17 MED FILL — ATENOLOL 25 MG TABLET: 25 | 90 days supply | Qty: 90 | Fill #2

## 2018-02-23 ENCOUNTER — Ambulatory Visit (INDEPENDENT_AMBULATORY_CARE_PROVIDER_SITE_OTHER): Payer: Medicare HMO | Admitting: Family Medicine

## 2018-02-23 ENCOUNTER — Encounter: Payer: Self-pay | Admitting: Family Medicine

## 2018-02-23 ENCOUNTER — Ambulatory Visit (HOSPITAL_BASED_OUTPATIENT_CLINIC_OR_DEPARTMENT_OTHER)
Admission: RE | Admit: 2018-02-23 | Discharge: 2018-02-23 | Disposition: A | Discharge status: Home / Self Care | Payer: Medicare HMO | Source: Ambulatory Visit | Attending: Family Medicine | Admitting: Family Medicine

## 2018-02-23 VITALS — BP 132/70 | HR 61 | Temp 98.0°F | Resp 18 | Ht 62.5 in | Wt 160.4 lb

## 2018-02-23 DIAGNOSIS — J4520 Mild intermittent asthma, uncomplicated: Secondary | ICD-10-CM | POA: Diagnosis not present

## 2018-02-23 DIAGNOSIS — Z1231 Encounter for screening mammogram for malignant neoplasm of breast: Secondary | ICD-10-CM | POA: Diagnosis not present

## 2018-02-23 DIAGNOSIS — R202 Paresthesia of skin: Secondary | ICD-10-CM | POA: Diagnosis not present

## 2018-02-23 DIAGNOSIS — G4730 Sleep apnea, unspecified: Secondary | ICD-10-CM

## 2018-02-23 DIAGNOSIS — I10 Essential (primary) hypertension: Secondary | ICD-10-CM

## 2018-02-23 DIAGNOSIS — Z1239 Encounter for other screening for malignant neoplasm of breast: Secondary | ICD-10-CM

## 2018-02-23 DIAGNOSIS — G4733 Obstructive sleep apnea (adult) (pediatric): Secondary | ICD-10-CM

## 2018-02-23 DIAGNOSIS — J202 Acute bronchitis due to streptococcus: Secondary | ICD-10-CM

## 2018-02-23 DIAGNOSIS — E1149 Type 2 diabetes mellitus with other diabetic neurological complication: Secondary | ICD-10-CM | POA: Diagnosis not present

## 2018-02-23 DIAGNOSIS — M5030 Other cervical disc degeneration, unspecified cervical region: Secondary | ICD-10-CM | POA: Insufficient documentation

## 2018-02-23 DIAGNOSIS — J45909 Unspecified asthma, uncomplicated: Secondary | ICD-10-CM

## 2018-02-23 DIAGNOSIS — M542 Cervicalgia: Secondary | ICD-10-CM

## 2018-02-23 DIAGNOSIS — M4802 Spinal stenosis, cervical region: Secondary | ICD-10-CM | POA: Diagnosis not present

## 2018-02-23 DIAGNOSIS — L989 Disorder of the skin and subcutaneous tissue, unspecified: Secondary | ICD-10-CM | POA: Diagnosis not present

## 2018-02-23 DIAGNOSIS — Z124 Encounter for screening for malignant neoplasm of cervix: Secondary | ICD-10-CM

## 2018-02-23 DIAGNOSIS — E559 Vitamin D deficiency, unspecified: Secondary | ICD-10-CM | POA: Diagnosis not present

## 2018-02-23 DIAGNOSIS — E785 Hyperlipidemia, unspecified: Secondary | ICD-10-CM | POA: Diagnosis not present

## 2018-02-23 DIAGNOSIS — M8580 Other specified disorders of bone density and structure, unspecified site: Secondary | ICD-10-CM

## 2018-02-23 DIAGNOSIS — Z0000 Encounter for general adult medical examination without abnormal findings: Secondary | ICD-10-CM

## 2018-02-23 DIAGNOSIS — E039 Hypothyroidism, unspecified: Secondary | ICD-10-CM | POA: Diagnosis not present

## 2018-02-23 MED ORDER — ALBUTEROL SULFATE HFA 108 (90 BASE) MCG/ACT IN AERS
INHALATION_SPRAY | RESPIRATORY_TRACT | 1 refills | Status: DC
Start: 2018-02-23 — End: 2018-12-02

## 2018-02-23 MED ORDER — SITAGLIPTIN PHOSPHATE 100 MG PO TABS: 100.0000 mg | ORAL_TABLET | Freq: Every day | ORAL | 1 refills | Status: DC

## 2018-02-23 MED FILL — VENTOLIN HFA 90 MCG INHALER: 108 (90 BAS | 17 days supply | Qty: 18 | Fill #0

## 2018-02-23 MED FILL — JANUVIA 100 MG TABLET: 100 | 30 days supply | Qty: 30 | Fill #0

## 2018-02-23 NOTE — Assessment & Plan Note (Signed)
Encouraged to get adequate exercise, calcium and vitamin d intake 

## 2018-02-23 NOTE — Assessment & Plan Note (Signed)
Inner right thigh lesion. Growing. Referred to dermatology

## 2018-02-23 NOTE — Assessment & Plan Note (Signed)
Supplement and monitor 

## 2018-02-23 NOTE — Assessment & Plan Note (Signed)
Encouraged heart healthy diet, increase exercise, avoid trans fats, consider a krill oil cap daily 

## 2018-02-23 NOTE — Progress Notes (Signed)
Subjective:  I acted as a Education administrator for Dr. Charlett Blake. Crystal [name], [place]  Patient ID: Crystal [name], female    DOB: 06/10/1951, 67 y.o.   MRN: [id number]  No chief complaint on file.   HPI  Patient is in today for an annual exam and follow up on chronic medical concerns such as diabetes, hyperlipidemia and hypertension. Also notes a rash on right thigh that is persistent x several weeks despite applying antifungal cream routinely. Her wheezing with exertion has been worsening. No acute illness. No febrile illness or hospitalizations. No polyuria or polydipsia. Is doing well and enjoys her new home in [place]. Is performing ADLs well. Tries to maintain a heart healthy diet and stay active. Denies CP/palp/HA/congestion/fevers/GI or GU c/o. Taking meds as prescribed  Patient Care Team: [name], MD as PCP - General (Family Medicine) Calvert Cantor, MD as Consulting Physician (Ophthalmology) Gatha Mayer, MD as Consulting Physician (Gastroenterology) Leinbach, Sol Blazing, MD as Referring Physician (Otolaryngology) Dene Gentry, MD as Consulting Physician (Sports Medicine) Rigoberto Noel, MD as Consulting Physician (Pulmonary Disease) Camelia Phenes, DPM as Consulting Physician (Podiatry) Phipps, C. Shanon Brow, MD as Consulting Physician (Otolaryngology)   Past Medical History:  Diagnosis Date  . Acute peptic ulcer of stomach    As a teenager  . Allergy    dust, cock roach,grass,weeds,molds  . Anxiety   . Asthma    02/08/10 FEV1 1.98 (80%), s/p saba 2.22 l/m (90%).nml  . Cognitive change 06/10/2016  . Depression   . Diabetes mellitus 2003   Type II  . Diverticulosis   . Dyspepsia   . Fatty liver 12/19/2010  . GERD (gastroesophageal reflux disease)   . High cholesterol   . Hyperlipemia   . Hypertension   . Hypothyroidism 09/08/2013  . IBS (irritable bowel syndrome)   . Internal hemorrhoids   . Joint pain 08/08/2016  . Low back pain 08/10/2016  . OSA  (obstructive sleep apnea)   . Plantar fasciitis   . Sun-damaged skin 12/09/2016  . Vitamin D deficiency 03/04/2016    Past Surgical History:  Procedure Laterality Date  . APPENDECTOMY     age 44  . CATARACT EXTRACTION Bilateral 2016   Dr. Bing Plume  . CATARACT EXTRACTION, BILATERAL     12/8 and 12/14  . COLONOSCOPY  06/03/2010, 08/25/2011   diverticulosis   . GASTRECTOMY     partial - ulcer as a teen  . HAND SURGERY Right   . HEMORRHOID BANDING    . KNEE ARTHROSCOPY Left 1998  . NASAL SEPTUM SURGERY    . RHINOPLASTY    . TOE SURGERY Right 1995    Family History  Problem Relation Age of Onset  . Diabetes Mother        type II  . Heart attack Mother 41  . Stroke Mother   . Hyperlipidemia Mother   . Colon cancer Sister   . Cancer Sister        GI  . Heart disease Sister   . Asthma Maternal Grandmother   . Mental illness Son        PTSD from [employer]  . Parkinsonism Father   . Cancer Sister        stage 4 bladder cancer  . Colon polyps Sister   . Ulcers Unknown        bleeding gastric  . Heart defect Unknown        child  . Other Unknown  perforated bowel, child  . Cancer Brother        pancreatic cancer  . Amblyopia Neg Hx   . Blindness Neg Hx   . Cataracts Neg Hx   . Glaucoma Neg Hx   . Macular degeneration Neg Hx   . Retinal detachment Neg Hx   . Strabismus Neg Hx   . Retinitis pigmentosa Neg Hx     Social History   Socioeconomic History  . Marital status: Married    Spouse name: [name]  . Number of children: 4  . Years of education: Not on file  . Highest education level: Not on file  Occupational History  . Occupation: retired    Fish farm manager: [employer]  Social Needs  . Financial resource strain: Not on file  . Food insecurity:    Worry: Not on file    Inability: Not on file  . Transportation needs:    Medical: Not on file    Non-medical: Not on file  Tobacco Use  . Smoking status: Never Smoker  . Smokeless tobacco: Never Used  Substance and  Sexual Activity  . Alcohol use: No    Alcohol/week: 0.0 standard drinks    Comment: Rare occasions   . Drug use: No  . Sexual activity: Not Currently    Comment: lives with her mother, no dietary restrictions  Lifestyle  . Physical activity:    Days per week: Not on file    Minutes per session: Not on file  . Stress: Not on file  Relationships  . Social connections:    Talks on phone: Not on file    Gets together: Not on file    Attends religious service: Not on file    Active member of club or organization: Not on file    Attends meetings of clubs or organizations: Not on file    Relationship status: Not on file  . Intimate partner violence:    Fear of current or ex partner: Not on file    Emotionally abused: Not on file    Physically abused: Not on file    Forced sexual activity: Not on file  Other Topics Concern  . Not on file  Social History Narrative   No caffeine drinks daily     Outpatient Medications Prior to Visit  Medication Sig Dispense Refill  . ACCU-CHEK SOFTCLIX LANCETS lancets Use as directed twice daily to check blood sugar. DX E11.9 200 each 3  . Alcohol Swabs (B-D SINGLE USE SWABS REGULAR) PADS Use as directed to check blood sugar. DX E11.9 100 each 3  . atenolol (TENORMIN) 25 MG tablet Take 1 tablet (25 mg total) by mouth daily. 90 tablet 3  . atorvastatin (LIPITOR) 80 MG tablet TAKE 1 TABLET EVERY DAY 90 tablet 2  . Blood Glucose Calibration (ACCU-CHEK AVIVA) SOLN Use as directed to check blood sugar.  DX E11.9 1 each 3  . Blood Glucose Monitoring Suppl (ACCU-CHEK AVIVA PLUS) w/Device KIT USE AS DIRECTED TO CHECK BLOOD SUGAR. 1 kit 0  . cetirizine (ZYRTEC) 10 MG tablet Take 1 tablet (10 mg total) by mouth daily as needed for allergies. 30 tablet 11  . Cholecalciferol (VITAMIN D3) 2000 units TABS Take 1 tablet by mouth daily.    Marland Kitchen dicyclomine (BENTYL) 10 MG capsule TAKE 1 CAPSULE EVERY 4-6 HOURS AS NEEDED FOR ABDOMINAL PAIN AND CRAMPING OR DIARRHEA 60  capsule 1  . famciclovir (FAMVIR) 500 MG tablet 1000 mg po bid x 24 hours then 250 mg po  bid every day 35 tablet 3  . fenofibrate 160 MG tablet TAKE 1 TABLET (160 MG TOTAL) BY MOUTH DAILY. 90 tablet 2  . fluticasone (FLONASE) 50 MCG/ACT nasal spray Place 2 sprays into both nostrils daily. 16 g 3  . glimepiride (AMARYL) 4 MG tablet Take 1 tablet (4 mg total) by mouth 2 (two) times daily. 180 tablet 1  . glucose blood (ACCU-CHEK AVIVA PLUS) test strip Use as directed twice daily to check blood sugar.  DX E11.9 200 each 3  . levothyroxine (SYNTHROID, LEVOTHROID) 25 MCG tablet TAKE 1 TABLET (25 MCG TOTAL) BY MOUTH DAILY BEFORE BREAKFAST. 90 tablet 2  . losartan (COZAAR) 50 MG tablet TAKE 1 TABLET EVERY DAY 90 tablet 1  . montelukast (SINGULAIR) 10 MG tablet Take 1 tablet (10 mg total) by mouth at bedtime as needed (allergies). 30 tablet 3  . Multiple Vitamins-Minerals (CENTRUM SILVER PO) Take 1 tablet by mouth daily.      . ondansetron (ZOFRAN ODT) 4 MG disintegrating tablet Take one tab by mouth Q6hr prn nausea.  Dissolve under tongue. 12 tablet 0  . pantoprazole (PROTONIX) 40 MG tablet TAKE 1 TABLET EVERY DAY 90 tablet 0  . Probiotic Product (ALIGN) 4 MG CAPS Take 1 capsule by mouth daily. Reported on 07/02/2015    . ramelteon (ROZEREM) 8 MG tablet Take 1 tablet (8 mg total) by mouth at bedtime. 90 tablet 1  . valACYclovir (VALTREX) 1000 MG tablet   6  . donepezil (ARICEPT) 10 MG tablet Take 1 tablet (10 mg total) by mouth at bedtime. 90 tablet 1  . escitalopram (LEXAPRO) 20 MG tablet Take 1 tablet (20 mg total) by mouth daily. 90 tablet 1  . glimepiride (AMARYL) 2 MG tablet TAKE 1 TABLET (2 MG TOTAL) BY MOUTH 2 (TWO) TIMES DAILY. 180 tablet 1  . sitaGLIPtin (JANUVIA) 100 MG tablet Take 1 tablet (100 mg total) by mouth daily. 90 tablet 0  . VENTOLIN HFA 108 (90 Base) MCG/ACT inhaler INHALE 2 PUFFS BY MOUTH INTO THE LUNGS EVERY 4 (FOUR) HOURS AS NEEDED FOR SHORTNESS OF BREATH AND WHEEZING 18 g 0    No facility-administered medications prior to visit.     Allergies  Allergen Reactions  . Compazine [Prochlorperazine Edisylate]     Comma for 3 days  . Tetanus Toxoid Other (See Comments)    convulsions  . Sulfa Antibiotics Other (See Comments)    Unknown    Review of Systems  Constitutional: Positive for malaise/fatigue. Negative for chills and fever.  HENT: Negative for congestion and hearing loss.   Eyes: Negative for discharge.  Respiratory: Positive for shortness of breath. Negative for cough and sputum production.   Cardiovascular: Negative for chest pain, palpitations and leg swelling.  Gastrointestinal: Negative for abdominal pain, blood in stool, constipation, diarrhea, heartburn, nausea and vomiting.  Genitourinary: Negative for dysuria, frequency, hematuria and urgency.  Musculoskeletal: Negative for back pain, falls and myalgias.  Skin: Positive for rash.  Neurological: Negative for dizziness, sensory change, loss of consciousness, weakness and headaches.  Endo/Heme/Allergies: Negative for environmental allergies. Does not bruise/bleed easily.  Psychiatric/Behavioral: Negative for depression and suicidal ideas. The patient is not nervous/anxious and does not have insomnia.        Objective:    Physical Exam  Constitutional: She is oriented to person, place, and time. She appears well-developed and well-nourished. No distress.  HENT:  Head: Normocephalic and atraumatic.  Eyes: Conjunctivae are normal.  Neck: Neck supple. No thyromegaly present.  Cardiovascular: Normal rate, regular rhythm and normal heart sounds.  No murmur heard. Pulmonary/Chest: Effort normal and breath sounds normal. No respiratory distress.  Abdominal: Soft. Bowel sounds are normal. She exhibits no distension and no mass. There is no tenderness.  Musculoskeletal: She exhibits no edema.  Lymphadenopathy:    She has no cervical adenopathy.  Neurological: She is alert and oriented to  person, place, and time.  Skin: Skin is warm and dry. Rash noted.  Oval shaped erythematous lesion 2 x 5 cm in size. Erythematous and slightly raised  Psychiatric: She has a normal mood and affect. Her behavior is normal.    BP 132/70 (BP Location: Left Arm, Patient Position: Sitting, Cuff Size: Normal)   Pulse 61   Temp 98 F (36.7 C) (Oral)   Resp 18   Ht 5' 2.5" (1.588 m)   Wt 160 lb 6.4 oz (72.8 kg)   SpO2 98%   BMI 28.87 kg/m  Wt Readings from Last 3 Encounters:  02/23/18 160 lb 6.4 oz (72.8 kg)  12/17/17 158 lb 3.2 oz (71.8 kg)  11/17/17 152 lb (68.9 kg)   BP Readings from Last 3 Encounters:  02/23/18 132/70  12/17/17 128/80  11/17/17 (!) 174/88     Immunization History  Administered Date(s) Administered  . Influenza Split 05/26/2011, 03/31/2012  . Influenza Whole 03/27/2010  . Influenza, High Dose Seasonal PF 04/02/2017  . Influenza,inj,Quad PF,6+ Mos 03/29/2013, 04/13/2014, 04/02/2015, 04/23/2016  . Pneumococcal Conjugate-13 04/25/2013  . Pneumococcal Polysaccharide-23 07/14/2004, 08/03/2015  . Zoster 12/27/2013    Health Maintenance  Topic Date Due  . OPHTHALMOLOGY EXAM  05/06/2016  . FOOT EXAM  10/06/2017  . INFLUENZA VACCINE  02/11/2018  . MAMMOGRAM  04/14/2018  . HEMOGLOBIN A1C  06/18/2018  . COLONOSCOPY  05/19/2021  . TETANUS/TDAP  09/10/2021  . DEXA SCAN  Completed  . Hepatitis C Screening  Completed  . PNA vac Low Risk Adult  Completed    Lab Results  Component Value Date   WBC 6.6 02/23/2018   HGB 12.6 02/23/2018   HCT 37.4 02/23/2018   PLT 266.0 02/23/2018   GLUCOSE 210 (H) 02/23/2018   CHOL 142 02/23/2018   TRIG 124.0 02/23/2018   HDL 51.70 02/23/2018   LDLDIRECT 123.0 09/18/2014   LDLCALC 66 02/23/2018   ALT 20 02/23/2018   AST 17 02/23/2018   NA 140 02/23/2018   K 4.0 02/23/2018   CL 105 02/23/2018   CREATININE 0.82 02/23/2018   BUN 17 02/23/2018   CO2 27 02/23/2018   TSH 4.79 (H) 02/23/2018   HGBA1C 7.4 (H) 02/23/2018    MICROALBUR 1.3 04/30/2015    Lab Results  Component Value Date   TSH 4.79 (H) 02/23/2018   Lab Results  Component Value Date   WBC 6.6 02/23/2018   HGB 12.6 02/23/2018   HCT 37.4 02/23/2018   MCV 92.6 02/23/2018   PLT 266.0 02/23/2018   Lab Results  Component Value Date   NA 140 02/23/2018   K 4.0 02/23/2018   CO2 27 02/23/2018   GLUCOSE 210 (H) 02/23/2018   BUN 17 02/23/2018   CREATININE 0.82 02/23/2018   BILITOT 0.3 02/23/2018   ALKPHOS 42 02/23/2018   AST 17 02/23/2018   ALT 20 02/23/2018   PROT 6.7 02/23/2018   ALBUMIN 4.1 02/23/2018   CALCIUM 9.3 02/23/2018   ANIONGAP 7 01/08/2015   GFR 73.50 02/23/2018   Lab Results  Component Value Date   CHOL 142 02/23/2018   Lab Results  Component Value Date   HDL 51.70 02/23/2018   Lab Results  Component Value Date   LDLCALC 66 02/23/2018   Lab Results  Component Value Date   TRIG 124.0 02/23/2018   Lab Results  Component Value Date   CHOLHDL 3 02/23/2018   Lab Results  Component Value Date   HGBA1C 7.4 (H) 02/23/2018         Assessment & Plan:   Problem List Items Addressed This Visit    DM (diabetes mellitus), type 2 with neurological complications ([place])   Relevant Medications   sitaGLIPtin (JANUVIA) 100 MG tablet   Other Relevant Orders   Hemoglobin A1c (Completed)   Hyperlipidemia    Encouraged heart healthy diet, increase exercise, avoid trans fats, consider a krill oil cap daily      Relevant Orders   Lipid panel (Completed)   Obstructive sleep apnea    Not using he CPAP agrees to referral to pulmonology      Essential hypertension    Well controlled, no changes to meds. Encouraged heart healthy diet such as the DASH diet and exercise as tolerated.       Asthma    Patient notes worsening dyspnea is referred to pulmonology for further evaluation      Relevant Medications   albuterol (VENTOLIN HFA) 108 (90 Base) MCG/ACT inhaler   Other Relevant Orders   Ambulatory referral to  Pulmonology   Skin lesion - Primary    Inner right thigh lesion. Growing. Referred to dermatology      Relevant Orders   Ambulatory referral to Dermatology   CBC (Completed)   Hypothyroidism    On Levothyroxine, continue to monitor      Relevant Orders   TSH (Completed)   Osteopenia    Encouraged to get adequate exercise, calcium and vitamin d intake      Preventative health care    Patient encouraged to maintain heart healthy diet, regular exercise, adequate sleep. Consider daily probiotics. Take medications as prescribed. Given and reviewed copy of ACP documents from [employer] and encouraged to complete and return. MGM ordered referred to GYN for pap      Vitamin D deficiency    Supplement and monitor      Relevant Orders   Comprehensive metabolic panel (Completed)   VITAMIN D 25 Hydroxy (Vit-D Deficiency, Fractures) (Completed)    Other Visit Diagnoses    Acute bronchitis due to Streptococcus       Sleep apnea, unspecified type       Relevant Orders   Ambulatory referral to Pulmonology   Neck pain       Relevant Orders   DG Cervical Spine Complete (Completed)   Breast cancer screening       Relevant Orders   MM 3D SCREEN BREAST BILATERAL   Cervical cancer screening       Relevant Orders   Ambulatory referral to Obstetrics / Gynecology      I have changed Camren P. Civil's VENTOLIN HFA to albuterol. I am also having her maintain her Multiple Vitamins-Minerals (CENTRUM SILVER PO), ALIGN, Vitamin D3, famciclovir, valACYclovir, atorvastatin, fluticasone, cetirizine, atenolol, pantoprazole, montelukast, ACCU-CHEK AVIVA, glucose blood, B-D SINGLE USE SWABS REGULAR, ACCU-CHEK SOFTCLIX LANCETS, ACCU-CHEK AVIVA PLUS, levothyroxine, ramelteon, fenofibrate, losartan, ondansetron, glimepiride, dicyclomine, and sitaGLIPtin.  Meds ordered this encounter  Medications  . DISCONTD: sitaGLIPtin (JANUVIA) 100 MG tablet    Sig: Take 1 tablet (100 mg total) by mouth  daily.    Dispense:  90  tablet    Refill:  1  . albuterol (VENTOLIN HFA) 108 (90 Base) MCG/ACT inhaler    Sig: INHALE 2 PUFFS BY MOUTH INTO THE LUNGS EVERY 4 (FOUR) HOURS AS NEEDED FOR SHORTNESS OF BREATH AND WHEEZING    Dispense:  18 g    Refill:  1  . DISCONTD: sitaGLIPtin (JANUVIA) 100 MG tablet    Sig: Take 1 tablet (100 mg total) by mouth daily.    Dispense:  30 tablet    Refill:  1  . sitaGLIPtin (JANUVIA) 100 MG tablet    Sig: Take 1 tablet (100 mg total) by mouth daily.    Dispense:  90 tablet    Refill:  1    CMA served as Education administrator during this visit. History, Physical and Plan performed by medical provider. Documentation and orders reviewed and attested to.  Penni Homans, MD

## 2018-02-23 NOTE — Assessment & Plan Note (Signed)
On Levothyroxine, continue to monitor 

## 2018-02-23 NOTE — Patient Instructions (Addendum)
Shingrix is the new shingles shot. Over 2-6 month window. At pharmacy   Preventive Care 65 Years and Older, Female Preventive care refers to lifestyle choices and visits with your health care provider that can promote health and wellness. What does preventive care include?  A yearly physical exam. This is also called an annual well check.  Dental exams once or twice a year.  Routine eye exams. Ask your health care provider how often you should have your eyes checked.  Personal lifestyle choices, including: ? Daily care of your teeth and gums. ? Regular physical activity. ? Eating a healthy diet. ? Avoiding tobacco and drug use. ? Limiting alcohol use. ? Practicing safe sex. ? Taking low-dose aspirin every day. ? Taking vitamin and mineral supplements as recommended by your health care provider. What happens during an annual well check? The services and screenings done by your health care provider during your annual well check will depend on your age, overall health, lifestyle risk factors, and family history of disease. Counseling Your health care provider may ask you questions about your:  Alcohol use.  Tobacco use.  Drug use.  Emotional well-being.  Home and relationship well-being.  Sexual activity.  Eating habits.  History of falls.  Memory and ability to understand (cognition).  Work and work Statistician.  Reproductive health.  Screening You may have the following tests or measurements:  Height, weight, and BMI.  Blood pressure.  Lipid and cholesterol levels. These may be checked every 5 years, or more frequently if you are over 62 years old.  Skin check.  Lung cancer screening. You may have this screening every year starting at age 55 if you have a 30-pack-year history of smoking and currently smoke or have quit within the past 15 years.  Fecal occult blood test (FOBT) of the stool. You may have this test every year starting at age 32.  Flexible  sigmoidoscopy or colonoscopy. You may have a sigmoidoscopy every 5 years or a colonoscopy every 10 years starting at age 68.  Hepatitis C blood test.  Hepatitis B blood test.  Sexually transmitted disease (STD) testing.  Diabetes screening. This is done by checking your blood sugar (glucose) after you have not eaten for a while (fasting). You may have this done every 1-3 years.  Bone density scan. This is done to screen for osteoporosis. You may have this done starting at age 42.  Mammogram. This may be done every 1-2 years. Talk to your health care provider about how often you should have regular mammograms.  Talk with your health care provider about your test results, treatment options, and if necessary, the need for more tests. Vaccines Your health care provider may recommend certain vaccines, such as:  Influenza vaccine. This is recommended every year.  Tetanus, diphtheria, and acellular pertussis (Tdap, Td) vaccine. You may need a Td booster every 10 years.  Varicella vaccine. You may need this if you have not been vaccinated.  Zoster vaccine. You may need this after age 56.  Measles, mumps, and rubella (MMR) vaccine. You may need at least one dose of MMR if you were born in 1957 or later. You may also need a second dose.  Pneumococcal 13-valent conjugate (PCV13) vaccine. One dose is recommended after age 106.  Pneumococcal polysaccharide (PPSV23) vaccine. One dose is recommended after age 49.  Meningococcal vaccine. You may need this if you have certain conditions.  Hepatitis A vaccine. You may need this if you have certain conditions or  if you travel or work in places where you may be exposed to hepatitis A.  Hepatitis B vaccine. You may need this if you have certain conditions or if you travel or work in places where you may be exposed to hepatitis B.  Haemophilus influenzae type b (Hib) vaccine. You may need this if you have certain conditions.  Talk to your health  care provider about which screenings and vaccines you need and how often you need them. This information is not intended to replace advice given to you by your health care provider. Make sure you discuss any questions you have with your health care provider. Document Released: 07/27/2015 Document Revised: 03/19/2016 Document Reviewed: 05/01/2015 Elsevier Interactive Patient Education  [employer].

## 2018-02-24 ENCOUNTER — Ambulatory Visit (INDEPENDENT_AMBULATORY_CARE_PROVIDER_SITE_OTHER): Payer: Medicare HMO | Admitting: Psychiatry

## 2018-02-24 ENCOUNTER — Encounter (HOSPITAL_COMMUNITY): Payer: Self-pay | Admitting: Psychiatry

## 2018-02-24 DIAGNOSIS — G3184 Mild cognitive impairment, so stated: Secondary | ICD-10-CM

## 2018-02-24 DIAGNOSIS — Z818 Family history of other mental and behavioral disorders: Secondary | ICD-10-CM

## 2018-02-24 DIAGNOSIS — F329 Major depressive disorder, single episode, unspecified: Secondary | ICD-10-CM | POA: Diagnosis not present

## 2018-02-24 DIAGNOSIS — F32A Depression, unspecified: Secondary | ICD-10-CM

## 2018-02-24 LAB — CBC
HEMATOCRIT: 37.4 % (ref 36.0–46.0)
HEMOGLOBIN: 12.6 g/dL (ref 12.0–15.0)
MCHC: 33.8 g/dL (ref 30.0–36.0)
MCV: 92.6 fl (ref 78.0–100.0)
Platelets: 266 10*3/uL (ref 150.0–400.0)
RBC: 4.04 Mil/uL (ref 3.87–5.11)
RDW: 13.2 % (ref 11.5–15.5)
WBC: 6.6 10*3/uL (ref 4.0–10.5)

## 2018-02-24 LAB — COMPREHENSIVE METABOLIC PANEL
ALBUMIN: 4.1 g/dL (ref 3.5–5.2)
ALT: 20 U/L (ref 0–35)
AST: 17 U/L (ref 0–37)
Alkaline Phosphatase: 42 U/L (ref 39–117)
BUN: 17 mg/dL (ref 6–23)
CO2: 27 mEq/L (ref 19–32)
Calcium: 9.3 mg/dL (ref 8.4–10.5)
Chloride: 105 mEq/L (ref 96–112)
Creatinine, Ser: 0.82 mg/dL (ref 0.40–1.20)
GFR: 73.5 mL/min (ref >60.00)
Glucose, Bld: 210 mg/dL — ABNORMAL HIGH (ref 70–99)
Potassium: 4 mEq/L (ref 3.5–5.1)
Sodium: 140 mEq/L (ref 135–145)
Total Bilirubin: 0.3 mg/dL (ref 0.2–1.2)
Total Protein: 6.7 g/dL (ref 6.0–8.3)

## 2018-02-24 LAB — LIPID PANEL
CHOLESTEROL: 142 mg/dL (ref 0–200)
HDL: 51.7 mg/dL (ref >39.00)
LDL Cholesterol: 66 mg/dL (ref 0–99)
NonHDL: 90.5
Total CHOL/HDL Ratio: 3
Triglycerides: 124 mg/dL (ref 0.0–149.0)
VLDL: 24.8 mg/dL (ref 0.0–40.0)

## 2018-02-24 LAB — VITAMIN D 25 HYDROXY (VIT D DEFICIENCY, FRACTURES): VITD: 26.16 ng/mL — ABNORMAL LOW (ref 30.00–100.00)

## 2018-02-24 LAB — HEMOGLOBIN A1C: Hgb A1c MFr Bld: 7.4 % — ABNORMAL HIGH (ref 4.6–6.5)

## 2018-02-24 LAB — TSH: TSH: 4.79 u[IU]/mL — ABNORMAL HIGH (ref 0.35–4.50)

## 2018-02-24 MED ORDER — ESCITALOPRAM OXALATE 20 MG PO TABS: 20.0000 mg | ORAL_TABLET | Freq: Every day | ORAL | 1 refills | Status: DC

## 2018-02-24 MED ORDER — DONEPEZIL HCL 10 MG PO TABS: 10.0000 mg | ORAL_TABLET | Freq: Every day | ORAL | 1 refills | Status: DC

## 2018-02-24 MED ORDER — ESCITALOPRAM OXALATE 20 MG PO TABS: 20.0000 mg | ORAL_TABLET | Freq: Every day | ORAL | 0 refills | Status: AC

## 2018-02-24 MED ORDER — DONEPEZIL HCL 10 MG PO TABS: 10.0000 mg | ORAL_TABLET | Freq: Every day | ORAL | 0 refills | Status: AC

## 2018-02-24 MED FILL — ESCITALOPRAM 20 MG TABLET: 20 | 30 days supply | Qty: 30 | Fill #0

## 2018-02-24 MED FILL — DONEPEZIL HCL 10 MG TABLET: 10 | 30 days supply | Qty: 30 | Fill #0

## 2018-02-24 NOTE — Assessment & Plan Note (Signed)
Patient encouraged to maintain heart healthy diet, regular exercise, adequate sleep. Consider daily probiotics. Take medications as prescribed. Given and reviewed copy of ACP documents from [employer] and encouraged to complete and return. MGM ordered referred to GYN for pap

## 2018-02-24 NOTE — Assessment & Plan Note (Signed)
Patient notes worsening dyspnea is referred to pulmonology for further evaluation

## 2018-02-24 NOTE — Patient Instructions (Signed)
[facility] [address], [place], [place], [address] [phone]

## 2018-02-24 NOTE — Assessment & Plan Note (Signed)
Well controlled, no changes to meds. Encouraged heart healthy diet such as the DASH diet and exercise as tolerated.  °

## 2018-02-24 NOTE — Assessment & Plan Note (Signed)
Not using he CPAP agrees to referral to pulmonology

## 2018-02-24 NOTE — Progress Notes (Signed)
[place] MD/PA/NP OP Progress Note  02/24/2018 2:25 PM Crystal [name]  MRN:  [id number]  Chief Complaint: med management  HPI: Crystal [name] reports that her anxiety and mood and thinking have been significantly improved.  She reports that things have been going well with her personal life and she is now living with her sister-in-law.  She tends to be fairly excited and expansive at baseline, reports that overall she feels like she is doing really well.  She denies any acute safety concerns and would like to continuing her care with this Probation officer at the new practice.  She continues to have somewhat of a strained relationship with her daughter.  She reports that her sleep has not been particularly good but this is complicated by the fact that she has sleep apnea and is not using a CPAP.  Her CPAP has been broken for about 6 years, and she reports that her primary care doctor is scheduling her for a new sleep study and repeat titration for CPAP.  Visit Diagnosis:    ICD-10-CM   1. Depression, unspecified depression type F32.9 escitalopram (LEXAPRO) 20 MG tablet    DISCONTINUED: escitalopram (LEXAPRO) 20 MG tablet  2. Mild cognitive impairment G31.84 donepezil (ARICEPT) 10 MG tablet    DISCONTINUED: donepezil (ARICEPT) 10 MG tablet    Past Psychiatric History: See intake H&P for full details. Reviewed, with no updates at this time.  Past Medical History:  Past Medical History:  Diagnosis Date  . Acute peptic ulcer of stomach    As a teenager  . Allergy    dust, cock roach,grass,weeds,molds  . Anxiety   . Asthma    02/08/10 FEV1 1.98 (80%), s/p saba 2.22 l/m (90%).nml  . Cognitive change 06/10/2016  . Depression   . Diabetes mellitus 2003   Type II  . Diverticulosis   . Dyspepsia   . Fatty liver 12/19/2010  . GERD (gastroesophageal reflux disease)   . High cholesterol   . Hyperlipemia   . Hypertension   . Hypothyroidism 09/08/2013  . IBS (irritable bowel syndrome)   . Internal  hemorrhoids   . Joint pain 08/08/2016  . Low back pain 08/10/2016  . OSA (obstructive sleep apnea)   . Plantar fasciitis   . Sun-damaged skin 12/09/2016  . Vitamin D deficiency 03/04/2016    Past Surgical History:  Procedure Laterality Date  . APPENDECTOMY     age 80  . CATARACT EXTRACTION Bilateral 2016   Dr. Bing Plume  . CATARACT EXTRACTION, BILATERAL     12/8 and 12/14  . COLONOSCOPY  06/03/2010, 08/25/2011   diverticulosis   . GASTRECTOMY     partial - ulcer as a teen  . HAND SURGERY Right   . HEMORRHOID BANDING    . KNEE ARTHROSCOPY Left 1998  . NASAL SEPTUM SURGERY    . RHINOPLASTY    . TOE SURGERY Right 1995    Family Psychiatric History: See intake H&P for full details. Reviewed, with no updates at this time.   Family History:  Family History  Problem Relation Age of Onset  . Diabetes Mother        type II  . Heart attack Mother 21  . Stroke Mother   . Hyperlipidemia Mother   . Colon cancer Sister   . Cancer Sister        GI  . Heart disease Sister   . Asthma Maternal Grandmother   . Mental illness Son  PTSD from [employer]  . Parkinsonism Father   . Cancer Sister        stage 4 bladder cancer  . Colon polyps Sister   . Ulcers Unknown        bleeding gastric  . Heart defect Unknown        child  . Other Unknown        perforated bowel, child  . Cancer Brother        pancreatic cancer  . Amblyopia Neg Hx   . Blindness Neg Hx   . Cataracts Neg Hx   . Glaucoma Neg Hx   . Macular degeneration Neg Hx   . Retinal detachment Neg Hx   . Strabismus Neg Hx   . Retinitis pigmentosa Neg Hx     Social History:  Social History   Socioeconomic History  . Marital status: Married    Spouse name: [name]  . Number of children: 4  . Years of education: Not on file  . Highest education level: Not on file  Occupational History  . Occupation: retired    Fish farm manager: [employer]  Social Needs  . Financial resource strain: Not on file  . Food insecurity:    Worry:  Not on file    Inability: Not on file  . Transportation needs:    Medical: Not on file    Non-medical: Not on file  Tobacco Use  . Smoking status: Never Smoker  . Smokeless tobacco: Never Used  Substance and Sexual Activity  . Alcohol use: No    Alcohol/week: 0.0 standard drinks    Comment: Rare occasions   . Drug use: No  . Sexual activity: Not Currently    Comment: lives with her mother, no dietary restrictions  Lifestyle  . Physical activity:    Days per week: Not on file    Minutes per session: Not on file  . Stress: Not on file  Relationships  . Social connections:    Talks on phone: Not on file    Gets together: Not on file    Attends religious service: Not on file    Active member of club or organization: Not on file    Attends meetings of clubs or organizations: Not on file    Relationship status: Not on file  Other Topics Concern  . Not on file  Social History Narrative   No caffeine drinks daily     Allergies:  Allergies  Allergen Reactions  . Compazine [Prochlorperazine Edisylate]     Comma for 3 days  . Tetanus Toxoid Other (See Comments)    convulsions  . Sulfa Antibiotics Other (See Comments)    Unknown    Metabolic Disorder Labs: Lab Results  Component Value Date   HGBA1C 7.4 (H) 02/23/2018   MPG 160 (H) 08/03/2015   MPG 151 (H) 12/27/2013   No results found for: PROLACTIN Lab Results  Component Value Date   CHOL 142 02/23/2018   TRIG 124.0 02/23/2018   HDL 51.70 02/23/2018   CHOLHDL 3 02/23/2018   VLDL 24.8 02/23/2018   LDLCALC 66 02/23/2018   LDLCALC 55 08/07/2017   Lab Results  Component Value Date   TSH 4.79 (H) 02/23/2018   TSH 3.62 08/07/2017    Therapeutic Level Labs: No results found for: LITHIUM No results found for: VALPROATE No components found for:  CBMZ  Current Medications: Current Outpatient Medications  Medication Sig Dispense Refill  . ACCU-CHEK SOFTCLIX LANCETS lancets Use as directed twice daily to check  blood sugar. DX E11.9 200 each 3  . albuterol (VENTOLIN HFA) 108 (90 Base) MCG/ACT inhaler INHALE 2 PUFFS BY MOUTH INTO THE LUNGS EVERY 4 (FOUR) HOURS AS NEEDED FOR SHORTNESS OF BREATH AND WHEEZING 18 g 1  . Alcohol Swabs (B-D SINGLE USE SWABS REGULAR) PADS Use as directed to check blood sugar. DX E11.9 100 each 3  . atenolol (TENORMIN) 25 MG tablet Take 1 tablet (25 mg total) by mouth daily. 90 tablet 3  . atorvastatin (LIPITOR) 80 MG tablet TAKE 1 TABLET EVERY DAY 90 tablet 2  . Blood Glucose Calibration (ACCU-CHEK AVIVA) SOLN Use as directed to check blood sugar.  DX E11.9 1 each 3  . Blood Glucose Monitoring Suppl (ACCU-CHEK AVIVA PLUS) w/Device KIT USE AS DIRECTED TO CHECK BLOOD SUGAR. 1 kit 0  . cetirizine (ZYRTEC) 10 MG tablet Take 1 tablet (10 mg total) by mouth daily as needed for allergies. 30 tablet 11  . Cholecalciferol (VITAMIN D3) 2000 units TABS Take 1 tablet by mouth daily.    Marland Kitchen dicyclomine (BENTYL) 10 MG capsule TAKE 1 CAPSULE EVERY 4-6 HOURS AS NEEDED FOR ABDOMINAL PAIN AND CRAMPING OR DIARRHEA 60 capsule 1  . donepezil (ARICEPT) 10 MG tablet Take 1 tablet (10 mg total) by mouth at bedtime. 30 tablet 0  . escitalopram (LEXAPRO) 20 MG tablet Take 1 tablet (20 mg total) by mouth daily. 30 tablet 0  . famciclovir (FAMVIR) 500 MG tablet 1000 mg po bid x 24 hours then 250 mg po bid every day 35 tablet 3  . fenofibrate 160 MG tablet TAKE 1 TABLET (160 MG TOTAL) BY MOUTH DAILY. 90 tablet 2  . fluticasone (FLONASE) 50 MCG/ACT nasal spray Place 2 sprays into both nostrils daily. 16 g 3  . glimepiride (AMARYL) 4 MG tablet Take 1 tablet (4 mg total) by mouth 2 (two) times daily. 180 tablet 1  . glucose blood (ACCU-CHEK AVIVA PLUS) test strip Use as directed twice daily to check blood sugar.  DX E11.9 200 each 3  . levothyroxine (SYNTHROID, LEVOTHROID) 25 MCG tablet TAKE 1 TABLET (25 MCG TOTAL) BY MOUTH DAILY BEFORE BREAKFAST. 90 tablet 2  . losartan (COZAAR) 50 MG tablet TAKE 1 TABLET EVERY  DAY 90 tablet 1  . montelukast (SINGULAIR) 10 MG tablet Take 1 tablet (10 mg total) by mouth at bedtime as needed (allergies). 30 tablet 3  . Multiple Vitamins-Minerals (CENTRUM SILVER PO) Take 1 tablet by mouth daily.      . ondansetron (ZOFRAN ODT) 4 MG disintegrating tablet Take one tab by mouth Q6hr prn nausea.  Dissolve under tongue. 12 tablet 0  . pantoprazole (PROTONIX) 40 MG tablet TAKE 1 TABLET EVERY DAY 90 tablet 0  . Probiotic Product (ALIGN) 4 MG CAPS Take 1 capsule by mouth daily. Reported on 07/02/2015    . ramelteon (ROZEREM) 8 MG tablet Take 1 tablet (8 mg total) by mouth at bedtime. 90 tablet 1  . sitaGLIPtin (JANUVIA) 100 MG tablet Take 1 tablet (100 mg total) by mouth daily. 90 tablet 1  . valACYclovir (VALTREX) 1000 MG tablet   6   No current facility-administered medications for this visit.     Musculoskeletal: Strength & Muscle Tone: within normal limits Gait & Station: normal Patient leans: N/A  Psychiatric Specialty Exam: ROS  Blood pressure 138/82, pulse 72, height _0  (1.549 m), weight 157 lb (71.2 kg), SpO2 95 %.Body mass index is 29.66 kg/m.  General Appearance: Casual and Well Groomed  Eye Contact:  Fair  Speech:  Clear and Coherent and Normal Rate  Volume:  Normal  Mood:  Euthymic  Affect:  Appropriate and Congruent  Thought Process:  Coherent, Goal Directed and Descriptions of Associations: Intact  Orientation:  Full (Time, Place, and Person)  Thought Content: Logical   Suicidal Thoughts:  No  Homicidal Thoughts:  No  Memory:  Immediate;   Fair  Judgement:  Fair  Insight:  Shallow  Psychomotor Activity:  Normal  Concentration:  Attention Span: Fair  Recall:  [employer] of Knowledge: Fair  Language: Fair  Akathisia:  Negative  Handed:  Right  AIMS (if indicated): not done  Assets:  Communication Skills Desire for Improvement Financial Resources/Insurance Housing Social Support Transportation  ADL's:  Intact  Cognition: WNL  Sleep:   Fair limited by sleep apnea   Screenings: Mini-Mental     Office Visit from 10/06/2016 in [facility] at [place] from 07/02/2015 in [place] at [facility]  Total Score (max 30 points )  29  28    PHQ2-9     Office Visit from 12/09/2016 in [facility] at [place] Visit from 10/06/2016 in [facility] at [place] Visit from 08/08/2016 in [facility] at [place] from [facility]/13/2015 in [facility] Clinical Support from 07/02/2015 in [place] at [place]  PHQ-2 Total Score  0  1  0  0  1       Assessment and Plan: Crystal [name] presents with general remission of depressive and anxiety symptoms, and is tolerating Lexapro without any issues.  She continues to work at [employer] in [place] and this brings her significant joy.  She seems to be improving in terms of her cognitive symptoms with the dose of Aricept, and reports that she has noticed she is much less forgetful.  She is living with her sister-in-law now so that she has increased support.  Overall she is quite happy with this combination of medications.  1. Depression, unspecified depression type   2. Mild cognitive impairment     Status of current problems: gradually improving  Labs Ordered: No orders of the defined types were placed in this encounter.   Labs Reviewed: N/A  Collateral Obtained/Records Reviewed: N/A  Plan:  Aricept 10 mg for neurocognitive symptoms Lexapro 20 mg daily RTC 3 months  Aundra Dubin, MD 02/24/2018, 2:25 PM

## 2018-02-25 ENCOUNTER — Ambulatory Visit (HOSPITAL_BASED_OUTPATIENT_CLINIC_OR_DEPARTMENT_OTHER)
Admission: RE | Admit: 2018-02-25 | Discharge: 2018-02-25 | Disposition: A | Discharge status: Home / Self Care | Payer: Medicare HMO | Source: Ambulatory Visit | Attending: Family Medicine | Admitting: Family Medicine

## 2018-02-25 DIAGNOSIS — Z1239 Encounter for other screening for malignant neoplasm of breast: Secondary | ICD-10-CM

## 2018-02-25 DIAGNOSIS — Z1231 Encounter for screening mammogram for malignant neoplasm of breast: Secondary | ICD-10-CM | POA: Insufficient documentation

## 2018-03-01 ENCOUNTER — Other Ambulatory Visit: Payer: Self-pay

## 2018-03-01 DIAGNOSIS — E559 Vitamin D deficiency, unspecified: Secondary | ICD-10-CM

## 2018-03-01 MED ORDER — VITAMIN D (ERGOCALCIFEROL) 1.25 MG (50000 UNIT) PO CAPS: 50000.0000 [IU] | ORAL_CAPSULE | ORAL | Status: DC

## 2018-03-01 MED ORDER — VITAMIN D (ERGOCALCIFEROL) 1.25 MG (50000 UNIT) PO CAPS: 50000.0000 [IU] | ORAL_CAPSULE | ORAL | 3 refills | Status: DC

## 2018-03-01 MED FILL — VIT D2 1.25 MG (50,000 UNIT: 1.25 MG | 28 days supply | Qty: 4 | Fill #0

## 2018-03-01 NOTE — Progress Notes (Signed)
e

## 2018-03-10 ENCOUNTER — Telehealth: Payer: Self-pay

## 2018-03-10 ENCOUNTER — Other Ambulatory Visit: Payer: Self-pay | Admitting: Family Medicine

## 2018-03-10 NOTE — Telephone Encounter (Signed)
Copied from [place] [id number]. Topic: Referral - Status >> Mar 10, 2018  3:41 PM Valla Leaver wrote: Reason for CRM: Patient was told her insurance is not accepted at the dermatology office Dr. Randel Pigg referred her to. Please advise.

## 2018-03-10 NOTE — Telephone Encounter (Signed)
Referral was sent to [facility] office does not accept patient's insurance.

## 2018-03-10 NOTE — Telephone Encounter (Signed)
Can we please look into switching her to another dermatology office due to insurance

## 2018-03-16 ENCOUNTER — Ambulatory Visit: Payer: Medicare HMO | Admitting: Obstetrics & Gynecology

## 2018-03-16 ENCOUNTER — Encounter: Payer: Self-pay | Admitting: Obstetrics & Gynecology

## 2018-03-16 VITALS — BP 129/79 | HR 68 | Ht 62.0 in | Wt 158.0 lb

## 2018-03-16 DIAGNOSIS — Z23 Encounter for immunization: Secondary | ICD-10-CM

## 2018-03-16 DIAGNOSIS — N939 Abnormal uterine and vaginal bleeding, unspecified: Secondary | ICD-10-CM | POA: Diagnosis not present

## 2018-03-16 NOTE — Progress Notes (Signed)
Last mammogram 02/25/18- negative Last pap smear 09/05/13- negative Pt wants to discuss Herpes flare ups

## 2018-03-16 NOTE — Progress Notes (Signed)
   Subjective:    Patient ID: Crystal [name], female    DOB: [birth year], 67 y.o.   MRN: [id number]  HPI Patient is a 67 year old female who presents for questions about recurrent herpes and some vaginal spotting she has.  Patient was having herpes flareups when she was married with her husband.  They were both monogamous and marriage however she and he did have partners before they were married.  Patient could have brought HSV into the relationship.  She was on suppression for Valtrex but stopped.  She has not had a recurrence in 9 months.  She feels like she is managing the stress in her life better now.  She is living with her sister-in-law and they are enjoying each other's company.  She also complaining of vaginal spotting when wiping after urination.  There is no mucus associated with the blood.  Patient does have large hemorrhoids but she does not think it is coming from this.  She last blood about a month ago.  This is been going on for several months to years.  Patient wondered if she was wiping too vigorously and causing irritation.   Review of Systems  Respiratory: Negative.   Cardiovascular: Negative.   Gastrointestinal: Negative.   Genitourinary: Positive for vaginal bleeding.  Psychiatric/Behavioral: The patient is nervous/anxious.        Objective:   Physical Exam  Constitutional: She is oriented to person, place, and time. She appears well-developed and well-nourished. No distress.  HENT:  Head: Normocephalic and atraumatic.  Eyes: Conjunctivae are normal.  Cardiovascular: Normal rate.  Pulmonary/Chest: Effort normal.  Genitourinary:  Genitourinary Comments: Normal breast exam Sternal genitalia is Tanner V There are no vulvar lesions or areas of irritation On his pale pink with mild atrophic changes.  Cervix is normal.  There is no polyps. Large hemorrhoids noted  Musculoskeletal: She exhibits no edema.  Neurological: She is alert and oriented to person, place,  and time.  Skin: Skin is warm and dry.  Psychiatric: She has a normal mood and affect.  Vitals reviewed.  Vitals:   03/16/18 1437  BP: 129/79  Pulse: 68  Weight: 158 lb (71.7 kg)  Height: 5\' 2"  (1.575 m)   Assessment & Plan:  67 year old female with herpes and vaginal bleeding 1.  Counseling regarding HSV infection.  Patient has not had outbreak for 9 months.  We will not restart suppressive therapy at this time.  Patient will call us if she has another outbreak. 2.  Patient has vaginal spotting for several months now.  None for the past month.  We will get a transvaginal ultrasound to look at endometrial stripe.  If thickened we will have patient come back for endometrial biopsy.  If negative then patient should come in when bleeding is occurring so we can look again.  30 minutes spent face-to-face with patient with greater than 50% counseling.

## 2018-03-18 ENCOUNTER — Emergency Department (HOSPITAL_BASED_OUTPATIENT_CLINIC_OR_DEPARTMENT_OTHER)
Admission: EM | Admit: 2018-03-18 | Discharge: 2018-03-18 | Disposition: A | Discharge status: Home / Self Care | Payer: Worker's Compensation | Attending: Emergency Medicine | Admitting: Emergency Medicine

## 2018-03-18 ENCOUNTER — Ambulatory Visit (INDEPENDENT_AMBULATORY_CARE_PROVIDER_SITE_OTHER): Payer: Medicare HMO

## 2018-03-18 ENCOUNTER — Encounter (HOSPITAL_BASED_OUTPATIENT_CLINIC_OR_DEPARTMENT_OTHER): Payer: Self-pay | Admitting: Emergency Medicine

## 2018-03-18 ENCOUNTER — Other Ambulatory Visit: Payer: Self-pay

## 2018-03-18 ENCOUNTER — Emergency Department (HOSPITAL_BASED_OUTPATIENT_CLINIC_OR_DEPARTMENT_OTHER): Payer: Worker's Compensation

## 2018-03-18 DIAGNOSIS — R1909 Other intra-abdominal and pelvic swelling, mass and lump: Secondary | ICD-10-CM | POA: Diagnosis not present

## 2018-03-18 DIAGNOSIS — E1140 Type 2 diabetes mellitus with diabetic neuropathy, unspecified: Secondary | ICD-10-CM | POA: Insufficient documentation

## 2018-03-18 DIAGNOSIS — Y990 Civilian activity done for income or pay: Secondary | ICD-10-CM | POA: Insufficient documentation

## 2018-03-18 DIAGNOSIS — Y9301 Activity, walking, marching and hiking: Secondary | ICD-10-CM | POA: Diagnosis not present

## 2018-03-18 DIAGNOSIS — L920 Granuloma annulare: Secondary | ICD-10-CM | POA: Diagnosis not present

## 2018-03-18 DIAGNOSIS — Y9289 Other specified places as the place of occurrence of the external cause: Secondary | ICD-10-CM | POA: Diagnosis not present

## 2018-03-18 DIAGNOSIS — S92101A Unspecified fracture of right talus, initial encounter for closed fracture: Secondary | ICD-10-CM | POA: Diagnosis not present

## 2018-03-18 DIAGNOSIS — I10 Essential (primary) hypertension: Secondary | ICD-10-CM | POA: Diagnosis not present

## 2018-03-18 DIAGNOSIS — N939 Abnormal uterine and vaginal bleeding, unspecified: Secondary | ICD-10-CM

## 2018-03-18 DIAGNOSIS — N950 Postmenopausal bleeding: Secondary | ICD-10-CM | POA: Diagnosis not present

## 2018-03-18 DIAGNOSIS — J45909 Unspecified asthma, uncomplicated: Secondary | ICD-10-CM | POA: Diagnosis not present

## 2018-03-18 DIAGNOSIS — Z7984 Long term (current) use of oral hypoglycemic drugs: Secondary | ICD-10-CM | POA: Diagnosis not present

## 2018-03-18 DIAGNOSIS — W010XXA Fall on same level from slipping, tripping and stumbling without subsequent striking against object, initial encounter: Secondary | ICD-10-CM | POA: Insufficient documentation

## 2018-03-18 DIAGNOSIS — E039 Hypothyroidism, unspecified: Secondary | ICD-10-CM | POA: Diagnosis not present

## 2018-03-18 DIAGNOSIS — Z79899 Other long term (current) drug therapy: Secondary | ICD-10-CM | POA: Diagnosis not present

## 2018-03-18 DIAGNOSIS — S8991XA Unspecified injury of right lower leg, initial encounter: Secondary | ICD-10-CM | POA: Diagnosis present

## 2018-03-18 MED ORDER — ACETAMINOPHEN 500 MG PO TABS
1000.0000 mg | ORAL_TABLET | Freq: Once | ORAL | Status: AC
  Administered 2018-03-18: 1000 mg via ORAL
  Filled 2018-03-18: qty 2

## 2018-03-18 MED ORDER — HYDROCODONE-ACETAMINOPHEN 5-325 MG PO TABS: 1.0000 | ORAL_TABLET | Freq: Four times a day (QID) | ORAL | 0 refills | Status: DC | PRN

## 2018-03-18 MED FILL — TRIAMCINOLONE 0.1% CREAM: 0.1 | 15 days supply | Qty: 30 | Fill #0

## 2018-03-18 NOTE — ED Provider Notes (Signed)
[facility] Provider Note   CSN: [id number] Arrival date & time: 03/18/18  1758     History   Chief Complaint Chief Complaint  Patient presents with  . Fall    HPI Crystal [name] is a 67 y.o. female who presents for evaluation of right ankle pain after mechanical fall that occurred at work.  Patient reports she was walking with a box in her hand and states that she tripped, causing an inversion injury of her right ankle.  Patient reports that she fell and her ankle was bent underneath her.  She did not hit her head or lose consciousness.  Patient reports that she was not able to get up and ambulate or bear weight on the foot since incident.  She had to be assisted off the floor by 2 coworkers.  Patient states she has not taken anything for pain.  Pain is worse with movement of the ankle.  Patient denies any numbness/weakness.  The history is provided by the patient.    Past Medical History:  Diagnosis Date  . Acute peptic ulcer of stomach    As a teenager  . Allergy    dust, cock roach,grass,weeds,molds  . Anxiety   . Asthma    02/08/10 FEV1 1.98 (80%), s/p saba 2.22 l/m (90%).nml  . Cognitive change 06/10/2016  . Depression   . Diabetes mellitus 2003   Type II  . Diverticulosis   . Dyspepsia   . Fatty liver 12/19/2010  . GERD (gastroesophageal reflux disease)   . Herpes simplex virus (HSV) infection   . High cholesterol   . Hyperlipemia   . Hypertension   . Hypothyroidism 09/08/2013  . IBS (irritable bowel syndrome)   . Internal hemorrhoids   . Joint pain 08/08/2016  . Low back pain 08/10/2016  . OSA (obstructive sleep apnea)   . Plantar fasciitis   . Sun-damaged skin 12/09/2016  . Vaginal Pap smear, abnormal   . Vitamin D deficiency 03/04/2016    Patient Active Problem List   Diagnosis Date Noted  . Burn 08/07/2017  . Tremor 06/26/2017  . Left knee pain 06/18/2017  . Right knee pain 12/11/2016  . Herpes simplex disease  12/09/2016  . Sun-damaged skin 12/09/2016  . 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 Mild cognitive impairment 08/21/2016  . Mild single current episode of major depressive disorder ([place]) 08/21/2016  . Low back pain 08/10/2016  . Joint pain 08/08/2016  . Prolapsed internal hemorrhoids, grade 2 06/18/2016  . Cognitive change 06/10/2016  . Family history of colon cancer 05/19/2016  . Vitamin D deficiency 03/04/2016  . Deformity of metatarsal bone of right foot 08/15/2015  . Metatarsalgia of right foot 08/15/2015  . Otalgia 04/02/2015  . Preventative health care 06/25/2014  . Sinusitis 12/09/2013  . Osteopenia 09/19/2013  . Hypothyroidism 09/08/2013  . Osteoarthritis, knee 09/06/2013  . Skin lesion 09/01/2013  . Dysphagia, unspecified(787.20) 07/19/2013  . Allergic rhinitis 11/16/2012  . Osteoarthritis of both hands 10/15/2012  . Peripheral neuropathy 12/19/2011  . 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 GERD (gastroesophageal reflux disease) 03/07/2011  . Fatty liver 12/19/2010  . Genital herpes 08/06/2010  . MAMMOGRAM, ABNORMAL 08/02/2010  . ABNORMAL ELECTROCARDIOGRAM 07/19/2010  . Obstructive sleep apnea 06/18/2010  . DM (diabetes mellitus), type 2 with neurological complications ([place]) 02/40/9735  . Hyperlipidemia 04/12/2010  . Essential hypertension 04/12/2010  . Asthma 04/12/2010  . DEPRESSION/ANXIETY 03/13/2010    Past Surgical History:  Procedure Laterality Date  . APPENDECTOMY     age 71  . CATARACT EXTRACTION Bilateral 2016   Dr. Bing Plume  . CATARACT EXTRACTION, BILATERAL     12/8 and 12/14  . COLONOSCOPY  06/03/2010, 08/25/2011   diverticulosis   . GASTRECTOMY     partial - ulcer as a teen  . HAND SURGERY Right   . HEMORRHOID BANDING    . KNEE ARTHROSCOPY Left 1998  . NASAL SEPTUM SURGERY    . RHINOPLASTY    . TOE SURGERY Right 1995     OB History    Gravida  4   Para  4   Term      Preterm      AB       Living  3     SAB      TAB      Ectopic      Multiple      Live Births           Obstetric Comments  1 child died at 16 weeks (heart defects, perforated bowel)         Home Medications    Prior to Admission medications   Medication Sig Start Date End Date Taking? Authorizing Provider  ACCU-CHEK SOFTCLIX LANCETS lancets Use as directed twice daily to check blood sugar. DX E11.9 07/15/17   [name], MD  albuterol (VENTOLIN HFA) 108 (90 Base) MCG/ACT inhaler INHALE 2 PUFFS BY MOUTH INTO THE LUNGS EVERY 4 (FOUR) HOURS AS NEEDED FOR SHORTNESS OF BREATH AND WHEEZING 02/23/18   [name], MD  Alcohol Swabs (B-D SINGLE USE SWABS REGULAR) PADS Use as directed to check blood sugar. DX E11.9 07/15/17   [name], MD  atenolol (TENORMIN) 25 MG tablet Take 1 tablet (25 mg total) by mouth daily. 06/26/17   [name], MD  atorvastatin (LIPITOR) 80 MG tablet TAKE 1 TABLET EVERY DAY 04/06/17   [name], MD  Blood Glucose Calibration (ACCU-CHEK AVIVA) SOLN Use as directed to check blood sugar.  DX E11.9 07/15/17   [name], MD  Blood Glucose Monitoring Suppl (ACCU-CHEK AVIVA PLUS) w/Device KIT USE AS DIRECTED TO CHECK BLOOD SUGAR. 07/27/17   [name], MD  cetirizine (ZYRTEC) 10 MG tablet Take 1 tablet (10 mg total) by mouth daily as needed for allergies. 05/07/17   [name], MD  Cholecalciferol (VITAMIN D3) 2000 units TABS Take 1 tablet by mouth daily.    [provider]  dicyclomine (BENTYL) 10 MG capsule TAKE 1 CAPSULE EVERY 4-6 HOURS AS NEEDED FOR ABDOMINAL PAIN AND CRAMPING OR DIARRHEA 01/19/18   Levin Erp, PA  donepezil (ARICEPT) 10 MG tablet Take 1 tablet (10 mg total) by mouth at bedtime. 02/24/18 02/24/19  Aundra Dubin, MD  escitalopram (LEXAPRO) 20 MG tablet Take 1 tablet (20 mg total) by mouth daily. 02/24/18   Aundra Dubin, MD  famciclovir [facility]) 500 MG tablet 1000 mg po bid x 24 hours then 250 mg po bid  every day 12/09/16   [name], MD  fenofibrate 160 MG tablet TAKE 1 TABLET (160 MG TOTAL) BY MOUTH DAILY. 10/02/17   [name], MD  fluticasone (FLONASE) 50 MCG/ACT nasal spray Place 2 sprays into both nostrils daily. 05/07/17   [name], MD  glimepiride (AMARYL) 4 MG tablet Take 1 tablet (4 mg total) by mouth 2 (two) times daily. 11/27/17   [name], MD  glucose blood (ACCU-CHEK AVIVA PLUS) test strip Use as directed twice daily to check blood sugar.  DX E11.9 07/15/17   [name], MD  HYDROcodone-acetaminophen (NORCO/VICODIN) 5-325 MG tablet Take 1-2 tablets by mouth every 6 (six) hours as needed. 03/18/18   Volanda Napoleon, PA-C  levothyroxine (SYNTHROID, LEVOTHROID) 25 MCG tablet TAKE 1 TABLET (25 MCG TOTAL) BY MOUTH DAILY BEFORE BREAKFAST. 07/27/17   [name], MD  losartan (COZAAR) 50 MG tablet TAKE 1 TABLET EVERY DAY 11/04/17   [name], MD  montelukast (SINGULAIR) 10 MG tablet Take 1 tablet (10 mg total) by mouth at bedtime as needed (allergies). 07/15/17   [name], MD  Multiple Vitamins-Minerals (CENTRUM SILVER PO) Take 1 tablet by mouth daily.      [provider]  ondansetron (ZOFRAN ODT) 4 MG disintegrating tablet Take one tab by mouth Q6hr prn nausea.  Dissolve under tongue. 11/17/17   Kandra Nicolas, MD  pantoprazole (PROTONIX) 40 MG tablet TAKE 1 TABLET EVERY DAY 03/12/18   [name], MD  Probiotic Product (ALIGN) 4 MG CAPS Take 1 capsule by mouth daily. Reported on 07/02/2015    [provider]  ramelteon (ROZEREM) 8 MG tablet Take 1 tablet (8 mg total) by mouth at bedtime. 09/24/17   Eksir, Richard Miu, MD  sitaGLIPtin (JANUVIA) 100 MG tablet Take 1 tablet (100 mg total) by mouth daily. 02/23/18   [name], MD  valACYclovir (VALTREX) 1000 MG tablet  02/02/17   [provider]  Vitamin D, Ergocalciferol, (DRISDOL) 50000 units CAPS capsule Take 1 capsule (50,000 Units total) by mouth every 7 (seven)  days for 12 doses. 03/01/18 05/18/18  [name], MD    Family History Family History  Problem Relation Age of Onset  . Diabetes Mother        type II  . Heart attack Mother 48  . Stroke Mother   . Hyperlipidemia Mother   . Colon cancer Sister   . Cancer Sister        GI  . Heart disease Sister   . Asthma Maternal Grandmother   . Mental illness Son        PTSD from [employer]  . Parkinsonism Father   . Cancer Sister        stage 4 bladder cancer  . Colon polyps Sister   . Ulcers Unknown        bleeding gastric  . Heart defect Unknown        child  . Other Unknown        perforated bowel, child  . Cancer Brother        pancreatic cancer  . Amblyopia Neg Hx   . Blindness Neg Hx   . Cataracts Neg Hx   . Glaucoma Neg Hx   . Macular degeneration Neg Hx   . Retinal detachment Neg Hx   . Strabismus Neg Hx   . Retinitis pigmentosa Neg Hx     Social History Social History   Tobacco Use  . Smoking status: Never Smoker  . Smokeless tobacco: Never Used  Substance Use Topics  . Alcohol use: No    Alcohol/week: 0.0 standard drinks    Comment: Rare occasions   . Drug use: No     Allergies   Compazine [prochlorperazine edisylate]; Tetanus toxoid; and Sulfa antibiotics   Review of Systems Review of Systems  Musculoskeletal:       Right ankle pain  Neurological:  Negative for weakness and numbness.  All other systems reviewed and are negative.    Physical Exam Updated Vital Signs BP (!) 144/87 (BP Location: Left Arm)   Pulse 65   Temp 98.4 F (36.9 C) (Oral)   Resp 20   Ht '5\' 5"'  (1.651 m)   Wt 71.7 kg   SpO2 97%   BMI 26.29 kg/m   Physical Exam  Constitutional: She appears well-developed and well-nourished.  HENT:  Head: Normocephalic and atraumatic.  Eyes: Conjunctivae and EOM are normal. Right eye exhibits no discharge. Left eye exhibits no discharge. No scleral icterus.  Cardiovascular:  Pulses:      Dorsalis pedis pulses are 2+ on the right  side, and 2+ on the left side.  Pulmonary/Chest: Effort normal.  Musculoskeletal:  Tenderness palpation noted to lateral malleolus of the right ankle with overlying soft tissue swelling.  No deformity or crepitus noted.  Limited plantarflexion dorsiflexion secondary to pain.  Patient can move all 5 digits of right foot without any difficulty.  No tenderness palpation to distal, proximal tib-fib or right knee.  No tenderness palpation of the left lower extremity  Neurological: She is alert.  Sensation intact along major nerve distributions of BLE  Skin: Skin is warm and dry. Capillary refill takes less than 2 seconds.  Good distal cap refill. RLE is not dusky in appearance or cool to touch.  Psychiatric: She has a normal mood and affect. Her speech is normal and behavior is normal.  Nursing note and vitals reviewed.    ED Treatments / Results  Labs (all labs ordered are listed, but only abnormal results are displayed) Labs Reviewed - No data to display  EKG None  Radiology Dg Ankle Complete Right  Result Date: 03/18/2018 CLINICAL DATA:  67 year old female with left ankle pain and swelling after injuring it earlier today. EXAM: RIGHT ANKLE - COMPLETE 3+ VIEW COMPARISON:  None. FINDINGS: Extensive soft tissue swelling surrounding the lateral malleolus. Subtle cortical irregularity at the lateral process of the talus. Fracture is not excluded. The distal fibula is intact. The ankle mortise remains congruent in the talar dome is intact. No evidence of fracture at the base of the fifth metatarsal. IMPRESSION: 1. Subtle cortical irregularity at the lateral talar process. Nondisplaced fracture is not excluded. 2. Extensive overlying soft tissue swelling. Electronically Signed   By: Jacqulynn Cadet M.D.   On: 03/18/2018 19:27   US Transvaginal Non-ob  Result Date: 03/18/2018 CLINICAL DATA:  Postmenopausal spotting for several months EXAM: ULTRASOUND PELVIS TRANSVAGINAL TECHNIQUE: Transvaginal  ultrasound examination of the pelvis was performed including evaluation of the uterus, ovaries, adnexal regions, and pelvic cul-de-sac. COMPARISON:  None FINDINGS: Uterus Measurements: 7.6 x 4.0 x 4.7 cm. Mildly heterogeneous myometrial echogenicity without focal mural abnormality Endometrium Thickness: 13.4 mm. Focally thickened with a masslike area of heterogeneous increased echogenicity at the mid uterus 1.8 x 1.8 x 1.4 cm with scattered areas of shadowing. Finding is worrisome for endometrial neoplasm. No endometrial fluid. Right ovary Measurements: 1.7 x 1.0 x 1.6 cm. Grossly normal morphology without mass Left ovary Not visualized on either transabdominal or endovaginal imaging, likely obscured by bowel Other findings:  No free pelvic fluid or adnexal masses IMPRESSION: 1.8 x 1.8 x 1.4 cm diameter heterogeneous mildly hyperechoic endometrial mass with scattered areas of shadowing worrisome for endometrial neoplasm. In the setting of post-menopausal bleeding, endometrial sampling is indicated to exclude carcinoma. If results are benign, sonohysterogram should be considered for focal lesion work-up prior to  hysteroscopy. (Ref: Radiological Reasoning: Algorithmic Workup of Abnormal Vaginal Bleeding with Endovaginal Sonography and Sonohysterography. AJR 2008; 737:T06-26) These results will be called to the ordering clinician or representative by the Radiologist Assistant, and communication documented in the PACS or zVision Dashboard. Electronically Signed   By: Lavonia Dana M.D.   On: 03/18/2018 16:39    Procedures Procedures (including critical care time)  Medications Ordered in ED Medications  acetaminophen (TYLENOL) tablet 1,000 mg (has no administration in time range)     Initial Impression / Assessment and Plan / ED Course  I have reviewed the triage vital signs and the nursing notes.  Pertinent labs & imaging results that were available during my care of the patient were reviewed by me and  considered in my medical decision making (see chart for details).     67 year old female who presents for evaluation of right ankle pain after mechanical fall that occurred just prior to ED arrival.  Patient reports he has not been able to ambulate or bear weight on the foot since incident.  Patient is afebrile, non-toxic appearing, sitting comfortably on examination table. Vital signs reviewed and stable. Patient is neurovascularly intact.  Patient with tenderness palpation to the right malleolus with overlying soft tissue swelling.  Consider fracture versus dislocation versus sprain.  Will obtain x-ray imaging.  X-ray reviewed.  There is a subtle cortical irregularity of the lateral talar process that cannot exclude nondisplaced fracture.  This correlates to patient's area of tenderness.  Discussed results with patient.  We will plan to put her in a splint.  Patient always already a patient of Dr. Barbaraann Barthel (Ortho).  Re-evaluation after splint placement. Good distal cap refill and sensation.  Encouraged her to follow-up with Dr. Barbaraann Barthel. Patient had ample opportunity for questions and discussion. All patient's questions were answered with full understanding. Strict return precautions discussed. Patient expresses understanding and agreement to plan.    Final Clinical Impressions(s) / ED Diagnoses   Final diagnoses:  Closed displaced fracture of right talus, unspecified portion of talus, initial encounter    ED Discharge Orders         Ordered    HYDROcodone-acetaminophen (NORCO/VICODIN) 5-325 MG tablet  Every 6 hours PRN     03/18/18 1945           Desma Mcgregor 03/18/18 2114    Margette Fast, MD 03/19/18 1137

## 2018-03-18 NOTE — ED Triage Notes (Signed)
Reports fall with injury to right ankle today at work.

## 2018-03-18 NOTE — Discharge Instructions (Signed)
You can take 1000 mg of Tylenol.  Do not exceed 4000 mg of Tylenol a day.  You can take pain medication for severe breakthrough pain.  Do not get your splint wet.  Elevate the leg when you are at home.  Use crutches.  Follow-up with Dr. Barbaraann Barthel in his office.  Return to emergency department for any worsening pain, numbness/weakness of the leg, discoloration of the leg or any other worsening or concerning symptoms.

## 2018-03-19 ENCOUNTER — Telehealth: Payer: Self-pay | Admitting: *Deleted

## 2018-03-19 MED ORDER — MISOPROSTOL 200 MCG PO TABS: ORAL_TABLET | ORAL | 0 refills | Status: DC

## 2018-03-19 MED FILL — miSOPROStol 200 MCG TABS: 200 | 1 days supply | Qty: 2 | Fill #0

## 2018-03-19 NOTE — Telephone Encounter (Signed)
Pt notified of TVU results and is scheduled for Endo Bx 03/24/18 with Dr Hulan Fray.  Per protocol Cytotec 200 mg sent to [facility] to take the night prior to appt.

## 2018-03-20 ENCOUNTER — Encounter: Payer: Self-pay | Admitting: Obstetrics & Gynecology

## 2018-03-20 DIAGNOSIS — N950 Postmenopausal bleeding: Secondary | ICD-10-CM | POA: Insufficient documentation

## 2018-03-23 ENCOUNTER — Ambulatory Visit (HOSPITAL_BASED_OUTPATIENT_CLINIC_OR_DEPARTMENT_OTHER)
Admission: RE | Admit: 2018-03-23 | Discharge: 2018-03-23 | Disposition: A | Discharge status: Home / Self Care | Payer: Medicare HMO | Source: Ambulatory Visit | Attending: Family Medicine | Admitting: Family Medicine

## 2018-03-23 ENCOUNTER — Encounter: Payer: Self-pay | Admitting: Family Medicine

## 2018-03-23 ENCOUNTER — Ambulatory Visit (INDEPENDENT_AMBULATORY_CARE_PROVIDER_SITE_OTHER): Payer: Worker's Compensation | Admitting: Family Medicine

## 2018-03-23 VITALS — BP 162/95 | HR 71 | Ht 62.0 in | Wt 158.0 lb

## 2018-03-23 DIAGNOSIS — M810 Age-related osteoporosis without current pathological fracture: Secondary | ICD-10-CM | POA: Diagnosis not present

## 2018-03-23 DIAGNOSIS — S3992XA Unspecified injury of lower back, initial encounter: Secondary | ICD-10-CM

## 2018-03-23 DIAGNOSIS — S99911A Unspecified injury of right ankle, initial encounter: Secondary | ICD-10-CM | POA: Diagnosis not present

## 2018-03-23 DIAGNOSIS — W19XXXA Unspecified fall, initial encounter: Secondary | ICD-10-CM | POA: Diagnosis not present

## 2018-03-23 DIAGNOSIS — M545 Low back pain: Secondary | ICD-10-CM | POA: Diagnosis not present

## 2018-03-23 DIAGNOSIS — M5136 Other intervertebral disc degeneration, lumbar region: Secondary | ICD-10-CM | POA: Insufficient documentation

## 2018-03-23 MED ORDER — HYDROCODONE-ACETAMINOPHEN 5-325 MG PO TABS: 1.0000 | ORAL_TABLET | ORAL | 0 refills | Status: DC | PRN

## 2018-03-23 MED FILL — HYDROCODON-APAP 5-325: 5-325 | 5 days supply | Qty: 30 | Fill #0

## 2018-03-23 NOTE — Patient Instructions (Addendum)
You have a talus fracture. Wear the boot at all times except to ice and wash the area. Icing 15 minutes at a time 3-4 times a day. Elevate above the heart as much as possible. Aleve 2 tabs twice a day with food for pain and inflammation. Norco as needed for severe pain. Knee scooter and/or wheelchair to help get around. Follow up with me in 1 week for reevaluation, repeat x-rays.

## 2018-03-24 ENCOUNTER — Encounter: Payer: Self-pay | Admitting: Obstetrics & Gynecology

## 2018-03-24 ENCOUNTER — Ambulatory Visit (INDEPENDENT_AMBULATORY_CARE_PROVIDER_SITE_OTHER): Payer: Medicare HMO | Admitting: Obstetrics & Gynecology

## 2018-03-24 VITALS — BP 130/80 | HR 78 | Resp 16 | Ht 62.0 in | Wt 158.0 lb

## 2018-03-24 DIAGNOSIS — N950 Postmenopausal bleeding: Secondary | ICD-10-CM | POA: Diagnosis not present

## 2018-03-24 MED FILL — JANUVIA 100 MG TABLET: 100 | 30 days supply | Qty: 30 | Fill #1

## 2018-03-24 NOTE — Addendum Note (Signed)
Addended by: Asencion Islam on: 03/24/2018 07:36 AM   Modules accepted: Orders

## 2018-03-24 NOTE — Progress Notes (Signed)
   Subjective:    Patient ID: Crystal [name], female    DOB: [birth year], 67 y.o.   MRN: [id number]  HPI 67 yo widowed P4 here for [facility]. She has had PMB for at least a month and a gyn u/s showed a 1.8 cm endometrial mass suspicious for endometrial cancer. She took cytotec last night.   Review of Systems     Objective:   Physical Exam Breathing, conversing normally She is in an air cast on her right foot, using a scooter Well nourished, well hydrated White female, no apparent distress  UPT negative, consent signed, time out done Cervix prepped with betadine and grasped with a single tooth tenaculum Uterus sounded to 9 cm Pipelle used for 2 passes with a small amount of tissue obtained. The biopsy did not correspond to her ultrasound findings. She tolerated the procedure well.     Assessment & Plan:  PMB- await pathology Come back Monday for results and plan If biopsy is negative, then I would recommend a d&c.

## 2018-03-26 ENCOUNTER — Encounter: Payer: Self-pay | Admitting: Pulmonary Disease

## 2018-03-26 ENCOUNTER — Ambulatory Visit (INDEPENDENT_AMBULATORY_CARE_PROVIDER_SITE_OTHER): Payer: Medicare HMO | Admitting: Pulmonary Disease

## 2018-03-26 VITALS — BP 120/70 | HR 87

## 2018-03-26 DIAGNOSIS — J4520 Mild intermittent asthma, uncomplicated: Secondary | ICD-10-CM

## 2018-03-26 DIAGNOSIS — G4733 Obstructive sleep apnea (adult) (pediatric): Secondary | ICD-10-CM

## 2018-03-26 NOTE — Progress Notes (Signed)
Crystal [name]    [id number]    May 07, 1951  Primary Care Physician:Blyth, Bonnita Levan, MD  Referring Physician: [name], MD [address] [place], [place] [address]  Chief complaint:   Patient with a history of known obstructive sleep apnea with recurrence of symptoms Has not used CPAP in a few years  HPI:  Patient with known obstructive sleep apnea diagnosed over 9 years ago She used CPAP up until about 2012 History of snoring Mouth breathing Daytime sleepiness Multiple comorbidities including hypertension, diabetes,  Had sinus surgery 1972  She did tolerate CPAP well with a full facemask in the past, stopped using the machine when it broke and she had a lot of other things going on at the time    Recent fall with a right foot injury  Outpatient Encounter Medications as of 03/26/2018  Medication Sig  . albuterol (VENTOLIN HFA) 108 (90 Base) MCG/ACT inhaler INHALE 2 PUFFS BY MOUTH INTO THE LUNGS EVERY 4 (FOUR) HOURS AS NEEDED FOR SHORTNESS OF BREATH AND WHEEZING  . atenolol (TENORMIN) 25 MG tablet Take 1 tablet (25 mg total) by mouth daily.  Marland Kitchen atorvastatin (LIPITOR) 80 MG tablet TAKE 1 TABLET EVERY DAY  . cetirizine (ZYRTEC) 10 MG tablet Take 1 tablet (10 mg total) by mouth daily as needed for allergies.  . Cholecalciferol (VITAMIN D3) 2000 units TABS Take 1 tablet by mouth daily.  Marland Kitchen dicyclomine (BENTYL) 10 MG capsule TAKE 1 CAPSULE EVERY 4-6 HOURS AS NEEDED FOR ABDOMINAL PAIN AND CRAMPING OR DIARRHEA  . donepezil (ARICEPT) 10 MG tablet Take 1 tablet (10 mg total) by mouth at bedtime.  Marland Kitchen escitalopram (LEXAPRO) 20 MG tablet Take 1 tablet (20 mg total) by mouth daily.  . famciclovir (FAMVIR) 500 MG tablet 1000 mg po bid x 24 hours then 250 mg po bid every day  . fenofibrate 160 MG tablet TAKE 1 TABLET (160 MG TOTAL) BY MOUTH DAILY.  Marland Kitchen glimepiride (AMARYL) 4 MG tablet Take 1 tablet (4 mg total) by mouth 2 (two) times daily.  Marland Kitchen  HYDROcodone-acetaminophen (NORCO/VICODIN) 5-325 MG tablet Take 1 tablet by mouth every 4 (four) hours as needed.  Marland Kitchen levothyroxine (SYNTHROID, LEVOTHROID) 25 MCG tablet TAKE 1 TABLET (25 MCG TOTAL) BY MOUTH DAILY BEFORE BREAKFAST.  Marland Kitchen losartan (COZAAR) 50 MG tablet TAKE 1 TABLET EVERY DAY  . misoprostol (CYTOTEC) 200 MCG tablet TAke 2 tabs PO the night prior to procedure  . montelukast (SINGULAIR) 10 MG tablet Take 1 tablet (10 mg total) by mouth at bedtime as needed (allergies).  . Multiple Vitamins-Minerals (CENTRUM SILVER PO) Take 1 tablet by mouth daily.    . ondansetron (ZOFRAN ODT) 4 MG disintegrating tablet Take one tab by mouth Q6hr prn nausea.  Dissolve under tongue.  . pantoprazole (PROTONIX) 40 MG tablet TAKE 1 TABLET EVERY DAY  . Probiotic Product (ALIGN) 4 MG CAPS Take 1 capsule by mouth daily. Reported on 07/02/2015  . ramelteon (ROZEREM) 8 MG tablet Take 1 tablet (8 mg total) by mouth at bedtime.  . sitaGLIPtin (JANUVIA) 100 MG tablet Take 1 tablet (100 mg total) by mouth daily.  Marland Kitchen triamcinolone cream (KENALOG) 0.1 %   . valACYclovir (VALTREX) 1000 MG tablet   . Vitamin D, Ergocalciferol, (DRISDOL) 50000 units CAPS capsule Take 1 capsule (50,000 Units total) by mouth every 7 (seven) days for 12 doses.  Marland Kitchen ACCU-CHEK SOFTCLIX LANCETS lancets Use as directed twice daily to check blood  sugar. DX E11.9 (Patient not taking: Reported on 03/26/2018)  . Alcohol Swabs (B-D SINGLE USE SWABS REGULAR) PADS Use as directed to check blood sugar. DX E11.9 (Patient not taking: Reported on 03/26/2018)  . Blood Glucose Calibration (ACCU-CHEK AVIVA) SOLN Use as directed to check blood sugar.  DX E11.9 (Patient not taking: Reported on 03/26/2018)  . Blood Glucose Monitoring Suppl (ACCU-CHEK AVIVA PLUS) w/Device KIT USE AS DIRECTED TO CHECK BLOOD SUGAR. (Patient not taking: Reported on 03/26/2018)  . fluticasone (FLONASE) 50 MCG/ACT nasal spray Place 2 sprays into both nostrils daily. (Patient not taking:  Reported on 03/26/2018)  . glucose blood (ACCU-CHEK AVIVA PLUS) test strip Use as directed twice daily to check blood sugar.  DX E11.9 (Patient not taking: Reported on 03/26/2018)   No facility-administered encounter medications on file as of 03/26/2018.     Allergies as of 03/26/2018 - Review Complete 03/26/2018  Allergen Reaction Noted  . Compazine [prochlorperazine edisylate]  06/15/2013  . Tetanus toxoid Other (See Comments)   . Sulfa antibiotics Other (See Comments)     Past Medical History:  Diagnosis Date  . Acute peptic ulcer of stomach    As a teenager  . Allergy    dust, cock roach,grass,weeds,molds  . Anxiety   . Asthma    02/08/10 FEV1 1.98 (80%), s/p saba 2.22 l/m (90%).nml  . Cognitive change 06/10/2016  . Depression   . Diabetes mellitus 2003   Type II  . Diverticulosis   . Dyspepsia   . Fatty liver 12/19/2010  . GERD (gastroesophageal reflux disease)   . Herpes simplex virus (HSV) infection   . High cholesterol   . Hyperlipemia   . Hypertension   . Hypothyroidism 09/08/2013  . IBS (irritable bowel syndrome)   . Internal hemorrhoids   . Joint pain 08/08/2016  . Low back pain 08/10/2016  . OSA (obstructive sleep apnea)   . Plantar fasciitis   . Sun-damaged skin 12/09/2016  . Vaginal Pap smear, abnormal   . Vitamin D deficiency 03/04/2016    Past Surgical History:  Procedure Laterality Date  . APPENDECTOMY     age 57  . CATARACT EXTRACTION Bilateral 2016   Dr. Bing Plume  . CATARACT EXTRACTION, BILATERAL     12/8 and 12/14  . COLONOSCOPY  06/03/2010, 08/25/2011   diverticulosis   . GASTRECTOMY     partial - ulcer as a teen  . HAND SURGERY Right   . HEMORRHOID BANDING    . KNEE ARTHROSCOPY Left 1998  . NASAL SEPTUM SURGERY    . RHINOPLASTY    . TOE SURGERY Right 1995    Family History  Problem Relation Age of Onset  . Diabetes Mother        type II  . Heart attack Mother 53  . Stroke Mother   . Hyperlipidemia Mother   . Colon cancer Sister   .  Cancer Sister        GI  . Heart disease Sister   . Asthma Maternal Grandmother   . Mental illness Son        PTSD from [employer]  . Parkinsonism Father   . Cancer Sister        stage 4 bladder cancer  . Colon polyps Sister   . Ulcers Unknown        bleeding gastric  . Heart defect Unknown        child  . Other Unknown        perforated bowel, child  .  Cancer Brother        pancreatic cancer  . Amblyopia Neg Hx   . Blindness Neg Hx   . Cataracts Neg Hx   . Glaucoma Neg Hx   . Macular degeneration Neg Hx   . Retinal detachment Neg Hx   . Strabismus Neg Hx   . Retinitis pigmentosa Neg Hx     Social History   Socioeconomic History  . Marital status: Married    Spouse name: [name]  . Number of children: 4  . Years of education: Not on file  . Highest education level: Not on file  Occupational History  . Occupation: retired    Fish farm manager: [employer]  Social Needs  . Financial resource strain: Not on file  . Food insecurity:    Worry: Not on file    Inability: Not on file  . Transportation needs:    Medical: Not on file    Non-medical: Not on file  Tobacco Use  . Smoking status: Never Smoker  . Smokeless tobacco: Never Used  Substance and Sexual Activity  . Alcohol use: No    Alcohol/week: 0.0 standard drinks    Comment: Rare occasions   . Drug use: No  . Sexual activity: Not Currently    Comment: lives with her mother, no dietary restrictions  Lifestyle  . Physical activity:    Days per week: Not on file    Minutes per session: Not on file  . Stress: Not on file  Relationships  . Social connections:    Talks on phone: Not on file    Gets together: Not on file    Attends religious service: Not on file    Active member of club or organization: Not on file    Attends meetings of clubs or organizations: Not on file    Relationship status: Not on file  . Intimate partner violence:    Fear of current or ex partner: Not on file    Emotionally abused: Not on file     Physically abused: Not on file    Forced sexual activity: Not on file  Other Topics Concern  . Not on file  Social History Narrative   No caffeine drinks daily     Review of Systems  Constitutional: Negative.   HENT: Negative.   Eyes: Negative.   Respiratory: Positive for apnea.   Cardiovascular: Negative.   Gastrointestinal: Negative.   Psychiatric/Behavioral: Positive for sleep disturbance.  All other systems reviewed and are negative.   Vitals:   03/26/18 1545  BP: 120/70  Pulse: 87  SpO2: 99%     Physical Exam  Constitutional: She is oriented to person, place, and time. She appears well-developed and well-nourished. No distress.  HENT:  Head: Normocephalic and atraumatic.  Crowded oropharynx  Eyes: Pupils are equal, round, and reactive to light. Conjunctivae and EOM are normal. Right eye exhibits no discharge.  Neck: Neck supple.  Cardiovascular: Normal rate.  Pulmonary/Chest: Effort normal and breath sounds normal.  Abdominal: Soft. Bowel sounds are normal. She exhibits no distension. There is no tenderness.  Neurological: She is alert and oriented to person, place, and time.  Skin: Skin is warm and dry. No erythema.  Psychiatric: She has a normal mood and affect. Her behavior is normal.    Data Reviewed: Records from Dr. Elsworth Soho  from 2015 reviewed She has severe obstructive sleep apnea-was on CPAP 11-an oral nasal mask  Assessment:   History of obstructive sleep apnea  History of asthma  Nonrestorative sleep Daytime sleepiness   Plan/Recommendations: We will schedule the patient for home sleep study and possible treatment of her sleep disordered breathing with an auto titrating CPAP  Pathophysiology of sleep disordered breathing discussed  Treatment options discussed  We will obtain a pulmonary function study  Continue other lines of care Encouraged to call if any significant problems  Sherrilyn Rist MD [facility] 03/26/2018, 4:05 PM  CC: [name], MD

## 2018-03-26 NOTE — Patient Instructions (Signed)
Excessive daytime sleepiness  Known obstructive sleep apnea   Plan for home sleep study  Obtain a pulmonary function study  I will see you back in the office about 8 weeks following initiation of treatment

## 2018-03-28 ENCOUNTER — Encounter: Payer: Self-pay | Admitting: Family Medicine

## 2018-03-28 NOTE — Progress Notes (Signed)
PCP: [name], MD  Subjective:   HPI: Patient is a 67 y.o. female here for right foot/ankle injury.  Patient reports while at work 9/5 she accidentally fell, inverted her right ankle badly and struck middle of low back on counter as she fell down. Pain level in her splint is now 0/10 but was sharp. Unable to bear weight initially. Currently in wheelchair. + swelling and bruising. No prior injuries. Intermittently had numbness.  Past Medical History:  Diagnosis Date  . Acute peptic ulcer of stomach    As a teenager  . Allergy    dust, cock roach,grass,weeds,molds  . Anxiety   . Asthma    02/08/10 FEV1 1.98 (80%), s/p saba 2.22 l/m (90%).nml  . Cognitive change 06/10/2016  . Depression   . Diabetes mellitus 2003   Type II  . Diverticulosis   . Dyspepsia   . Fatty liver 12/19/2010  . GERD (gastroesophageal reflux disease)   . Herpes simplex virus (HSV) infection   . High cholesterol   . Hyperlipemia   . Hypertension   . Hypothyroidism 09/08/2013  . IBS (irritable bowel syndrome)   . Internal hemorrhoids   . Joint pain 08/08/2016  . Low back pain 08/10/2016  . OSA (obstructive sleep apnea)   . Plantar fasciitis   . Sun-damaged skin 12/09/2016  . Vaginal Pap smear, abnormal   . Vitamin D deficiency 03/04/2016    Current Outpatient Medications on File Prior to Visit  Medication Sig Dispense Refill  . ACCU-CHEK SOFTCLIX LANCETS lancets Use as directed twice daily to check blood sugar. DX E11.9 (Patient not taking: Reported on 03/26/2018) 200 each 3  . albuterol (VENTOLIN HFA) 108 (90 Base) MCG/ACT inhaler INHALE 2 PUFFS BY MOUTH INTO THE LUNGS EVERY 4 (FOUR) HOURS AS NEEDED FOR SHORTNESS OF BREATH AND WHEEZING 18 g 1  . Alcohol Swabs (B-D SINGLE USE SWABS REGULAR) PADS Use as directed to check blood sugar. DX E11.9 (Patient not taking: Reported on 03/26/2018) 100 each 3  . atenolol (TENORMIN) 25 MG tablet Take 1 tablet (25 mg total) by mouth daily. 90 tablet 3  .  atorvastatin (LIPITOR) 80 MG tablet TAKE 1 TABLET EVERY DAY 90 tablet 2  . Blood Glucose Calibration (ACCU-CHEK AVIVA) SOLN Use as directed to check blood sugar.  DX E11.9 (Patient not taking: Reported on 03/26/2018) 1 each 3  . Blood Glucose Monitoring Suppl (ACCU-CHEK AVIVA PLUS) w/Device KIT USE AS DIRECTED TO CHECK BLOOD SUGAR. (Patient not taking: Reported on 03/26/2018) 1 kit 0  . cetirizine (ZYRTEC) 10 MG tablet Take 1 tablet (10 mg total) by mouth daily as needed for allergies. 30 tablet 11  . Cholecalciferol (VITAMIN D3) 2000 units TABS Take 1 tablet by mouth daily.    Marland Kitchen dicyclomine (BENTYL) 10 MG capsule TAKE 1 CAPSULE EVERY 4-6 HOURS AS NEEDED FOR ABDOMINAL PAIN AND CRAMPING OR DIARRHEA 60 capsule 1  . donepezil (ARICEPT) 10 MG tablet Take 1 tablet (10 mg total) by mouth at bedtime. 30 tablet 0  . escitalopram (LEXAPRO) 20 MG tablet Take 1 tablet (20 mg total) by mouth daily. 30 tablet 0  . famciclovir (FAMVIR) 500 MG tablet 1000 mg po bid x 24 hours then 250 mg po bid every day 35 tablet 3  . fenofibrate 160 MG tablet TAKE 1 TABLET (160 MG TOTAL) BY MOUTH DAILY. 90 tablet 2  . fluticasone (FLONASE) 50 MCG/ACT nasal spray Place 2 sprays into both nostrils daily. (Patient not taking: Reported on 03/26/2018) 16  g 3  . glimepiride (AMARYL) 4 MG tablet Take 1 tablet (4 mg total) by mouth 2 (two) times daily. 180 tablet 1  . glucose blood (ACCU-CHEK AVIVA PLUS) test strip Use as directed twice daily to check blood sugar.  DX E11.9 (Patient not taking: Reported on 03/26/2018) 200 each 3  . levothyroxine (SYNTHROID, LEVOTHROID) 25 MCG tablet TAKE 1 TABLET (25 MCG TOTAL) BY MOUTH DAILY BEFORE BREAKFAST. 90 tablet 2  . losartan (COZAAR) 50 MG tablet TAKE 1 TABLET EVERY DAY 90 tablet 1  . misoprostol (CYTOTEC) 200 MCG tablet TAke 2 tabs PO the night prior to procedure 2 tablet 0  . montelukast (SINGULAIR) 10 MG tablet Take 1 tablet (10 mg total) by mouth at bedtime as needed (allergies). 30 tablet 3  .  Multiple Vitamins-Minerals (CENTRUM SILVER PO) Take 1 tablet by mouth daily.      . ondansetron (ZOFRAN ODT) 4 MG disintegrating tablet Take one tab by mouth Q6hr prn nausea.  Dissolve under tongue. 12 tablet 0  . pantoprazole (PROTONIX) 40 MG tablet TAKE 1 TABLET EVERY DAY 90 tablet 0  . Probiotic Product (ALIGN) 4 MG CAPS Take 1 capsule by mouth daily. Reported on 07/02/2015    . ramelteon (ROZEREM) 8 MG tablet Take 1 tablet (8 mg total) by mouth at bedtime. 90 tablet 1  . sitaGLIPtin (JANUVIA) 100 MG tablet Take 1 tablet (100 mg total) by mouth daily. 90 tablet 1  . valACYclovir (VALTREX) 1000 MG tablet   6  . Vitamin D, Ergocalciferol, (DRISDOL) 50000 units CAPS capsule Take 1 capsule (50,000 Units total) by mouth every 7 (seven) days for 12 doses. 4 capsule 3   No current facility-administered medications on file prior to visit.     Past Surgical History:  Procedure Laterality Date  . APPENDECTOMY     age 59  . CATARACT EXTRACTION Bilateral 2016   Dr. Bing Plume  . CATARACT EXTRACTION, BILATERAL     12/8 and 12/14  . COLONOSCOPY  06/03/2010, 08/25/2011   diverticulosis   . GASTRECTOMY     partial - ulcer as a teen  . HAND SURGERY Right   . HEMORRHOID BANDING    . KNEE ARTHROSCOPY Left 1998  . NASAL SEPTUM SURGERY    . RHINOPLASTY    . TOE SURGERY Right 1995    Allergies  Allergen Reactions  . Compazine [Prochlorperazine Edisylate]     Comma for 3 days  . Tetanus Toxoid Other (See Comments)    convulsions  . Sulfa Antibiotics Other (See Comments)    Unknown    Social History   Socioeconomic History  . Marital status: Married    Spouse name: [name]  . Number of children: 4  . Years of education: Not on file  . Highest education level: Not on file  Occupational History  . Occupation: retired    Fish farm manager: [employer]  Social Needs  . Financial resource strain: Not on file  . Food insecurity:    Worry: Not on file    Inability: Not on file  . Transportation needs:     Medical: Not on file    Non-medical: Not on file  Tobacco Use  . Smoking status: Never Smoker  . Smokeless tobacco: Never Used  Substance and Sexual Activity  . Alcohol use: No    Alcohol/week: 0.0 standard drinks    Comment: Rare occasions   . Drug use: No  . Sexual activity: Not Currently    Comment: lives with her mother,  no dietary restrictions  Lifestyle  . Physical activity:    Days per week: Not on file    Minutes per session: Not on file  . Stress: Not on file  Relationships  . Social connections:    Talks on phone: Not on file    Gets together: Not on file    Attends religious service: Not on file    Active member of club or organization: Not on file    Attends meetings of clubs or organizations: Not on file    Relationship status: Not on file  . Intimate partner violence:    Fear of current or ex partner: Not on file    Emotionally abused: Not on file    Physically abused: Not on file    Forced sexual activity: Not on file  Other Topics Concern  . Not on file  Social History Narrative   No caffeine drinks daily     Family History  Problem Relation Age of Onset  . Diabetes Mother        type II  . Heart attack Mother 10  . Stroke Mother   . Hyperlipidemia Mother   . Colon cancer Sister   . Cancer Sister        GI  . Heart disease Sister   . Asthma Maternal Grandmother   . Mental illness Son        PTSD from [employer]  . Parkinsonism Father   . Cancer Sister        stage 4 bladder cancer  . Colon polyps Sister   . Ulcers Unknown        bleeding gastric  . Heart defect Unknown        child  . Other Unknown        perforated bowel, child  . Cancer Brother        pancreatic cancer  . Amblyopia Neg Hx   . Blindness Neg Hx   . Cataracts Neg Hx   . Glaucoma Neg Hx   . Macular degeneration Neg Hx   . Retinal detachment Neg Hx   . Strabismus Neg Hx   . Retinitis pigmentosa Neg Hx     BP (!) 162/95   Pulse 71   Ht '5\' 2"'  (1.575 m)   Wt 158 lb  (71.7 kg)   BMI 28.90 kg/m   Review of Systems: See HPI above.     Objective:  Physical Exam:  Gen: NAD, comfortable in exam room  Right foot/ankle: Splints removed. Mod swelling, bruising dorsal foot and antlat ankle.  No other gross deformity. Very limited motion in all directions but with some strength in all planes. TTP anterior ankle, over ATFL, into dorsolateral foot. Deferred ant drawer and talar tilt due to pain. Thompsons test negative. NV intact distally.  Left foot/ankle: No deformity. FROM with 5/5 strength. No tenderness to palpation. NVI distally.   Back: No gross deformity. Midline tenderness mid-lumbar region but no stepoffs. Negative SLRs. Sensation intact to light touch bilaterally.  Assessment & Plan:  1. Right ankle injury - independently reviewed radiographs and appears to have subtle lateral process fracture of the talus.  We discussed immobilization options and she preferred cam walker but with knee scooter or wheelchair.  Icing, elevation.  Aleve with norco as needed.  F/u in 1 week for reevaluation, repeat x-rays.  2. Low back injury - independently reviewed radiographs and no evidence fracture from her fall.  Reassured.

## 2018-03-29 ENCOUNTER — Ambulatory Visit (INDEPENDENT_AMBULATORY_CARE_PROVIDER_SITE_OTHER): Payer: Medicare HMO | Admitting: Obstetrics & Gynecology

## 2018-03-29 ENCOUNTER — Encounter: Payer: Self-pay | Admitting: Obstetrics & Gynecology

## 2018-03-29 ENCOUNTER — Encounter (HOSPITAL_COMMUNITY): Payer: Self-pay

## 2018-03-29 ENCOUNTER — Encounter (HOSPITAL_COMMUNITY): Payer: Self-pay | Admitting: *Deleted

## 2018-03-29 VITALS — BP 128/72 | HR 82 | Resp 16 | Ht 62.0 in | Wt 158.0 lb

## 2018-03-29 DIAGNOSIS — N950 Postmenopausal bleeding: Secondary | ICD-10-CM

## 2018-03-29 NOTE — Patient Instructions (Addendum)
Your procedure is scheduled on: Thursday, 9/19  Enter through the Main Entrance of [facility] at: 2 pm  Pick up the phone at the desk and dial [phone].  Call this number if you have problems the morning of surgery: [phone].  Remember: Do NOT eat food after midnight Wednesday  Do NOT drink clear liquids (including water) after 9 am Thursday, day of surgery.  Take these medicines the morning of surgery with a SIP OF WATER: atenolol, lexapro, fenofibrate, famciclovir if needed, protonix, synthroid and flonase nasal spray if needed.  Brush your teeth on the day of surgery.  Bring albuterol inhaler with you on day of surgery.  Do not take any diabetes medication on day of surgery.  We will check your blood sugar upon arrival and treat if needed.  Stop herbal medications, vitamin supplements, Ibuprofen/NSAIDS at this time.  Do NOT wear jewelry (body piercing), metal hair clips/bobby pins, make-up, or nail polish. Do NOT wear lotions, powders, or perfumes.  You may wear deoderant. Do NOT shave for 48 hours prior to surgery. Do NOT bring valuables to the hospital. Dentures/partials may not be worn into surgery.  Have a responsible adult drive you home and stay with you for 24 hours after your procedure.  Home with Daughter Butch Penny cell [phone]

## 2018-03-29 NOTE — Progress Notes (Signed)
   Subjective:    Patient ID: Crystal [name], female    DOB: Feb 07, 1951, 67 y.o.   MRN: [id number]  HPI 66 yo widowed P4 here for results of EMBX. Her u/s    Review of Systems     Objective:   Physical Exam Breathing, conversing normally Well nourished, well hydrated White female, no apparent distress Pathology was normal    Assessment & Plan:  Thickened endometrium/PMB Plan for d&c this Thursday

## 2018-03-30 ENCOUNTER — Encounter (HOSPITAL_COMMUNITY)
Admission: RE | Admit: 2018-03-30 | Discharge: 2018-03-30 | Disposition: A | Discharge status: Home / Self Care | Payer: Medicare HMO | Source: Ambulatory Visit | Attending: Obstetrics & Gynecology | Admitting: Obstetrics & Gynecology

## 2018-03-30 ENCOUNTER — Ambulatory Visit (INDEPENDENT_AMBULATORY_CARE_PROVIDER_SITE_OTHER): Payer: Worker's Compensation | Admitting: Family Medicine

## 2018-03-30 ENCOUNTER — Encounter (HOSPITAL_COMMUNITY): Payer: Self-pay

## 2018-03-30 ENCOUNTER — Ambulatory Visit (HOSPITAL_BASED_OUTPATIENT_CLINIC_OR_DEPARTMENT_OTHER)
Admission: RE | Admit: 2018-03-30 | Discharge: 2018-03-30 | Disposition: A | Discharge status: Home / Self Care | Payer: Medicare HMO | Source: Ambulatory Visit | Attending: Family Medicine | Admitting: Family Medicine

## 2018-03-30 ENCOUNTER — Other Ambulatory Visit: Payer: Self-pay

## 2018-03-30 ENCOUNTER — Encounter: Payer: Self-pay | Admitting: Family Medicine

## 2018-03-30 VITALS — BP 135/81 | HR 71 | Ht 62.0 in | Wt 158.0 lb

## 2018-03-30 DIAGNOSIS — M25571 Pain in right ankle and joints of right foot: Secondary | ICD-10-CM | POA: Diagnosis not present

## 2018-03-30 DIAGNOSIS — S99911D Unspecified injury of right ankle, subsequent encounter: Secondary | ICD-10-CM | POA: Diagnosis not present

## 2018-03-30 DIAGNOSIS — X58XXXD Exposure to other specified factors, subsequent encounter: Secondary | ICD-10-CM | POA: Diagnosis not present

## 2018-03-30 DIAGNOSIS — S99911A Unspecified injury of right ankle, initial encounter: Secondary | ICD-10-CM | POA: Diagnosis not present

## 2018-03-30 DIAGNOSIS — M7989 Other specified soft tissue disorders: Secondary | ICD-10-CM | POA: Diagnosis not present

## 2018-03-30 HISTORY — DX: Unspecified osteoarthritis, unspecified site: M19.90

## 2018-03-30 HISTORY — DX: Insomnia, unspecified: G47.00

## 2018-03-30 HISTORY — DX: Unspecified hearing loss, unspecified ear: H91.90

## 2018-03-30 HISTORY — DX: Other fracture of unspecified lower leg, initial encounter for closed fracture: S82.899A

## 2018-03-30 HISTORY — DX: Unspecified dementia, unspecified severity, without behavioral disturbance, psychotic disturbance, mood disturbance, and anxiety: F03.90

## 2018-03-30 HISTORY — DX: Presence of dental prosthetic device (complete) (partial): Z97.2

## 2018-03-30 LAB — BASIC METABOLIC PANEL
Anion gap: 8 (ref 5–15)
BUN: 13 mg/dL (ref 8–23)
CALCIUM: 9.7 mg/dL (ref 8.9–10.3)
CO2: 26 mmol/L (ref 22–32)
CREATININE: 0.85 mg/dL (ref 0.44–1.00)
Chloride: 107 mmol/L (ref 98–111)
GFR calc Af Amer: >60 mL/min (ref >60)
Glucose, Bld: 87 mg/dL (ref 70–99)
Potassium: 4.1 mmol/L (ref 3.5–5.1)
SODIUM: 141 mmol/L (ref 135–145)

## 2018-03-30 LAB — CBC
HCT: 41.5 % (ref 36.0–46.0)
Hemoglobin: 13.5 g/dL (ref 12.0–15.0)
MCH: 31 pg (ref 26.0–34.0)
MCHC: 32.5 g/dL (ref 30.0–36.0)
MCV: 95.2 fL (ref 78.0–100.0)
PLATELETS: 312 10*3/uL (ref 150–400)
RBC: 4.36 MIL/uL (ref 3.87–5.11)
RDW: 12.6 % (ref 11.5–15.5)
WBC: 7.8 10*3/uL (ref 4.0–10.5)

## 2018-03-30 NOTE — Progress Notes (Signed)
PCP: [name], MD  Subjective:   HPI: Patient is a 67 y.o. female here for right foot/ankle injury.  9/10: Patient reports while at work 9/5 she accidentally fell, inverted her right ankle badly and struck middle of low back on counter as she fell down. Pain level in her splint is now 0/10 but was sharp. Unable to bear weight initially. Currently in wheelchair. + swelling and bruising. No prior injuries. Intermittently had numbness.  9/17: Patient reports she's doing well. Pain currently at 0/10 level. Wearing boot regularly. Using knee scooter though she has put a little weight on her foot. Taking aleve some. Is elevating but no needed the norco or icing. Pain is an achiness when this comes on. No numbness.  Past Medical History:  Diagnosis Date  . Acute peptic ulcer of stomach    As a teenager  . Allergy    dust, cock roach,grass,weeds,molds  . Anxiety   . Asthma    02/08/10 FEV1 1.98 (80%), s/p saba 2.22 l/m (90%).nml  . Cognitive change 06/10/2016  . Depression   . Diabetes mellitus 2003   Type II  . Diverticulosis   . Dyspepsia   . Fatty liver 12/19/2010  . GERD (gastroesophageal reflux disease)   . Herpes simplex virus (HSV) infection   . High cholesterol   . Hyperlipemia   . Hypertension   . Hypothyroidism 09/08/2013  . IBS (irritable bowel syndrome)   . Internal hemorrhoids   . Joint pain 08/08/2016  . Low back pain 08/10/2016  . OSA (obstructive sleep apnea)   . Plantar fasciitis   . Sun-damaged skin 12/09/2016  . Vaginal Pap smear, abnormal   . Vitamin D deficiency 03/04/2016    Current Outpatient Medications on File Prior to Visit  Medication Sig Dispense Refill  . ACCU-CHEK SOFTCLIX LANCETS lancets Use as directed twice daily to check blood sugar. DX E11.9 200 each 3  . albuterol (VENTOLIN HFA) 108 (90 Base) MCG/ACT inhaler INHALE 2 PUFFS BY MOUTH INTO THE LUNGS EVERY 4 (FOUR) HOURS AS NEEDED FOR SHORTNESS OF BREATH AND WHEEZING 18 g 1  .  Alcohol Swabs (B-D SINGLE USE SWABS REGULAR) PADS Use as directed to check blood sugar. DX E11.9 100 each 3  . atenolol (TENORMIN) 25 MG tablet Take 1 tablet (25 mg total) by mouth daily. 90 tablet 3  . atorvastatin (LIPITOR) 80 MG tablet TAKE 1 TABLET EVERY DAY 90 tablet 2  . Blood Glucose Calibration (ACCU-CHEK AVIVA) SOLN Use as directed to check blood sugar.  DX E11.9 1 each 3  . Blood Glucose Monitoring Suppl (ACCU-CHEK AVIVA PLUS) w/Device KIT USE AS DIRECTED TO CHECK BLOOD SUGAR. 1 kit 0  . cetirizine (ZYRTEC) 10 MG tablet Take 1 tablet (10 mg total) by mouth daily as needed for allergies. 30 tablet 11  . Cholecalciferol (VITAMIN D3) 2000 units TABS Take 1 tablet by mouth daily.    Marland Kitchen dicyclomine (BENTYL) 10 MG capsule TAKE 1 CAPSULE EVERY 4-6 HOURS AS NEEDED FOR ABDOMINAL PAIN AND CRAMPING OR DIARRHEA 60 capsule 1  . donepezil (ARICEPT) 10 MG tablet Take 1 tablet (10 mg total) by mouth at bedtime. 30 tablet 0  . escitalopram (LEXAPRO) 20 MG tablet Take 1 tablet (20 mg total) by mouth daily. 30 tablet 0  . famciclovir (FAMVIR) 500 MG tablet 1000 mg po bid x 24 hours then 250 mg po bid every day 35 tablet 3  . fenofibrate 160 MG tablet TAKE 1 TABLET (160 MG TOTAL) BY  MOUTH DAILY. 90 tablet 2  . fluticasone (FLONASE) 50 MCG/ACT nasal spray Place 2 sprays into both nostrils daily. 16 g 3  . glimepiride (AMARYL) 4 MG tablet Take 1 tablet (4 mg total) by mouth 2 (two) times daily. 180 tablet 1  . glucose blood (ACCU-CHEK AVIVA PLUS) test strip Use as directed twice daily to check blood sugar.  DX E11.9 200 each 3  . HYDROcodone-acetaminophen (NORCO/VICODIN) 5-325 MG tablet Take 1 tablet by mouth every 4 (four) hours as needed. 30 tablet 0  . levothyroxine (SYNTHROID, LEVOTHROID) 25 MCG tablet TAKE 1 TABLET (25 MCG TOTAL) BY MOUTH DAILY BEFORE BREAKFAST. 90 tablet 2  . losartan (COZAAR) 50 MG tablet TAKE 1 TABLET EVERY DAY 90 tablet 1  . misoprostol (CYTOTEC) 200 MCG tablet TAke 2 tabs PO the night  prior to procedure 2 tablet 0  . montelukast (SINGULAIR) 10 MG tablet Take 1 tablet (10 mg total) by mouth at bedtime as needed (allergies). 30 tablet 3  . Multiple Vitamins-Minerals (CENTRUM SILVER PO) Take 1 tablet by mouth daily.      . ondansetron (ZOFRAN ODT) 4 MG disintegrating tablet Take one tab by mouth Q6hr prn nausea.  Dissolve under tongue. 12 tablet 0  . pantoprazole (PROTONIX) 40 MG tablet TAKE 1 TABLET EVERY DAY 90 tablet 0  . Probiotic Product (ALIGN) 4 MG CAPS Take 1 capsule by mouth daily. Reported on 07/02/2015    . ramelteon (ROZEREM) 8 MG tablet Take 1 tablet (8 mg total) by mouth at bedtime. 90 tablet 1  . sitaGLIPtin (JANUVIA) 100 MG tablet Take 1 tablet (100 mg total) by mouth daily. 90 tablet 1  . triamcinolone cream (KENALOG) 0.1 %   0  . valACYclovir (VALTREX) 1000 MG tablet   6  . Vitamin D, Ergocalciferol, (DRISDOL) 50000 units CAPS capsule Take 1 capsule (50,000 Units total) by mouth every 7 (seven) days for 12 doses. 4 capsule 3   No current facility-administered medications on file prior to visit.     Past Surgical History:  Procedure Laterality Date  . APPENDECTOMY     age 40  . CATARACT EXTRACTION Bilateral 2016   Dr. Bing Plume  . CATARACT EXTRACTION, BILATERAL     12/8 and 12/14  . COLONOSCOPY  06/03/2010, 08/25/2011   diverticulosis   . GASTRECTOMY     partial - ulcer as a teen  . HAND SURGERY Right   . HEMORRHOID BANDING    . KNEE ARTHROSCOPY Left 1998  . NASAL SEPTUM SURGERY    . RHINOPLASTY    . TOE SURGERY Right 1995    Allergies  Allergen Reactions  . Compazine [Prochlorperazine Edisylate]     Comma for 3 days  . Tetanus Toxoid Other (See Comments)    convulsions  . Sulfa Antibiotics Other (See Comments)    Unknown    Social History   Socioeconomic History  . Marital status: Married    Spouse name: [name]  . Number of children: 4  . Years of education: Not on file  . Highest education level: Not on file  Occupational History  .  Occupation: retired    Fish farm manager: [employer]  Social Needs  . Financial resource strain: Not on file  . Food insecurity:    Worry: Not on file    Inability: Not on file  . Transportation needs:    Medical: Not on file    Non-medical: Not on file  Tobacco Use  . Smoking status: Never Smoker  . Smokeless  tobacco: Never Used  Substance and Sexual Activity  . Alcohol use: No    Alcohol/week: 0.0 standard drinks    Comment: Rare occasions   . Drug use: No  . Sexual activity: Not Currently    Comment: lives with her mother, no dietary restrictions  Lifestyle  . Physical activity:    Days per week: Not on file    Minutes per session: Not on file  . Stress: Not on file  Relationships  . Social connections:    Talks on phone: Not on file    Gets together: Not on file    Attends religious service: Not on file    Active member of club or organization: Not on file    Attends meetings of clubs or organizations: Not on file    Relationship status: Not on file  . Intimate partner violence:    Fear of current or ex partner: Not on file    Emotionally abused: Not on file    Physically abused: Not on file    Forced sexual activity: Not on file  Other Topics Concern  . Not on file  Social History Narrative   No caffeine drinks daily     Family History  Problem Relation Age of Onset  . Diabetes Mother        type II  . Heart attack Mother 103  . Stroke Mother   . Hyperlipidemia Mother   . Colon cancer Sister   . Cancer Sister        GI  . Heart disease Sister   . Asthma Maternal Grandmother   . Mental illness Son        PTSD from [employer]  . Parkinsonism Father   . Cancer Sister        stage 4 bladder cancer  . Colon polyps Sister   . Ulcers Unknown        bleeding gastric  . Heart defect Unknown        child  . Other Unknown        perforated bowel, child  . Cancer Brother        pancreatic cancer  . Amblyopia Neg Hx   . Blindness Neg Hx   . Cataracts Neg Hx   .  Glaucoma Neg Hx   . Macular degeneration Neg Hx   . Retinal detachment Neg Hx   . Strabismus Neg Hx   . Retinitis pigmentosa Neg Hx     BP 135/81   Pulse 71   Ht '5\' 2"'  (1.575 m)   Wt 158 lb (71.7 kg)   BMI 28.90 kg/m   Review of Systems: See HPI above.     Objective:  Physical Exam:  Gen: NAD, comfortable in exam room  Right foot/ankle: Significant bruising lateral lower leg into foot.  Mild swelling lateral ankle into dorsal foot.  No other deformity. FROM  TTP lateral ankle greatest lateral ankle joint anteriorly, lateral talus.  No other tenderness. Negative ant drawer and talar tilt.   Negative syndesmotic compression. Thompsons test negative. NV intact distally.  MSK u/s:  Achilles, medial ankle tendons, lateral ankle tendons, dorsal tendons all intact without tears, tenosynovitis.  No cortical irregularities of talar dome, fibula, base 5th metatarsal, or tibia.  Small hematoma present over lateral talus as well.  Irregularity noted lateral talus as well.  Assessment & Plan:  1. Right ankle injury - independently reviewed repeat radiographs and consistent with subtle lateral process fracture of the talus, irregularity seen  of lateral talus on ultrasound as well but no intraarticular extension.  Cam walker.  Icing, elevation.  Aleve, norco if needed.  F/u in 2 weeks.  Transition to weight bearing as tolerated from knee scooter.

## 2018-03-30 NOTE — Patient Instructions (Signed)
Wear boot when up and walking around. Use knee scooter but ok at this point to transition to weight bearing as tolerated. Ok to remove boot when seated, to wash area, and when sleeping. Icing 15 minutes at a time 3-4 times a day. Elevate above your heart when tolerated. Aleve 2 tabs twice a day with food for pain and inflammation as needed. Norco as needed for severe pain. Follow up with me in 2 weeks - we should not have to repeat your x-rays again unless you're not improving as expected.

## 2018-03-31 ENCOUNTER — Encounter (HOSPITAL_COMMUNITY): Payer: Self-pay

## 2018-03-31 NOTE — Anesthesia Preprocedure Evaluation (Addendum)
Anesthesia Evaluation  Patient identified by MRN, date of birth, ID band Patient awake    Reviewed: Allergy & Precautions, H&P , NPO status , Patient's Chart, lab work & pertinent test results, reviewed documented beta blocker date and time   Airway Mallampati: I  TM Distance: >3 FB Neck ROM: full    Dental no notable dental hx. (+) Edentulous Upper, Poor Dentition, Chipped, Missing,    Pulmonary asthma , sleep apnea ,    Pulmonary exam normal breath sounds clear to auscultation       Cardiovascular Exercise Tolerance: Good hypertension, Pt. on medications and Pt. on home beta blockers  Rhythm:regular Rate:Normal     Neuro/Psych PSYCHIATRIC DISORDERS Anxiety Depression Dementia  Neuromuscular disease negative neurological ROS     GI/Hepatic Neg liver ROS, PUD, GERD  Medicated,  Endo/Other  diabetes, Oral Hypoglycemic AgentsHypothyroidism   Renal/GU negative Renal ROS  negative genitourinary   Musculoskeletal   Abdominal   Peds  Hematology negative hematology ROS (+)   Anesthesia Other Findings   Reproductive/Obstetrics negative OB ROS                            Anesthesia Physical Anesthesia Plan  ASA: III  Anesthesia Plan: General   Post-op Pain Management:    Induction: Intravenous  PONV Risk Score and Plan: 3 and Ondansetron, Dexamethasone, Treatment may vary due to age or medical condition and Propofol infusion  Airway Management Planned: LMA  Additional Equipment:   Intra-op Plan:   Post-operative Plan:   Informed Consent: I have reviewed the patients History and Physical, chart, labs and discussed the procedure including the risks, benefits and alternatives for the proposed anesthesia with the patient or authorized representative who has indicated his/her understanding and acceptance.   Dental Advisory Given  Plan Discussed with: CRNA, Anesthesiologist and  Surgeon  Anesthesia Plan Comments: ( )        Anesthesia Quick Evaluation

## 2018-04-01 ENCOUNTER — Encounter (HOSPITAL_COMMUNITY): Payer: Self-pay | Admitting: Emergency Medicine

## 2018-04-01 ENCOUNTER — Encounter (HOSPITAL_COMMUNITY)
Admission: RE | Disposition: A | Discharge status: Home / Self Care | Payer: Self-pay | Source: Ambulatory Visit | Attending: Obstetrics & Gynecology

## 2018-04-01 ENCOUNTER — Ambulatory Visit (HOSPITAL_COMMUNITY)
Admission: RE | Admit: 2018-04-01 | Discharge: 2018-04-01 | Disposition: A | Discharge status: Home / Self Care | Payer: Medicare HMO | Source: Ambulatory Visit | Attending: Obstetrics & Gynecology | Admitting: Obstetrics & Gynecology

## 2018-04-01 ENCOUNTER — Ambulatory Visit (HOSPITAL_COMMUNITY): Payer: Medicare HMO | Admitting: Anesthesiology

## 2018-04-01 DIAGNOSIS — K760 Fatty (change of) liver, not elsewhere classified: Secondary | ICD-10-CM | POA: Diagnosis not present

## 2018-04-01 DIAGNOSIS — E7800 Pure hypercholesterolemia, unspecified: Secondary | ICD-10-CM | POA: Diagnosis not present

## 2018-04-01 DIAGNOSIS — E039 Hypothyroidism, unspecified: Secondary | ICD-10-CM | POA: Insufficient documentation

## 2018-04-01 DIAGNOSIS — E559 Vitamin D deficiency, unspecified: Secondary | ICD-10-CM | POA: Insufficient documentation

## 2018-04-01 DIAGNOSIS — Z888 Allergy status to other drugs, medicaments and biological substances status: Secondary | ICD-10-CM | POA: Diagnosis not present

## 2018-04-01 DIAGNOSIS — E785 Hyperlipidemia, unspecified: Secondary | ICD-10-CM | POA: Insufficient documentation

## 2018-04-01 DIAGNOSIS — N950 Postmenopausal bleeding: Secondary | ICD-10-CM | POA: Insufficient documentation

## 2018-04-01 DIAGNOSIS — M19041 Primary osteoarthritis, right hand: Secondary | ICD-10-CM | POA: Insufficient documentation

## 2018-04-01 DIAGNOSIS — Z887 Allergy status to serum and vaccine status: Secondary | ICD-10-CM | POA: Insufficient documentation

## 2018-04-01 DIAGNOSIS — G4733 Obstructive sleep apnea (adult) (pediatric): Secondary | ICD-10-CM | POA: Insufficient documentation

## 2018-04-01 DIAGNOSIS — J45909 Unspecified asthma, uncomplicated: Secondary | ICD-10-CM | POA: Diagnosis not present

## 2018-04-01 DIAGNOSIS — I10 Essential (primary) hypertension: Secondary | ICD-10-CM | POA: Insufficient documentation

## 2018-04-01 DIAGNOSIS — R9389 Abnormal findings on diagnostic imaging of other specified body structures: Secondary | ICD-10-CM | POA: Diagnosis not present

## 2018-04-01 DIAGNOSIS — F419 Anxiety disorder, unspecified: Secondary | ICD-10-CM | POA: Diagnosis not present

## 2018-04-01 DIAGNOSIS — M19012 Primary osteoarthritis, left shoulder: Secondary | ICD-10-CM | POA: Diagnosis not present

## 2018-04-01 DIAGNOSIS — Z8249 Family history of ischemic heart disease and other diseases of the circulatory system: Secondary | ICD-10-CM | POA: Insufficient documentation

## 2018-04-01 DIAGNOSIS — M170 Bilateral primary osteoarthritis of knee: Secondary | ICD-10-CM | POA: Insufficient documentation

## 2018-04-01 DIAGNOSIS — F0390 Unspecified dementia without behavioral disturbance: Secondary | ICD-10-CM | POA: Diagnosis not present

## 2018-04-01 DIAGNOSIS — K589 Irritable bowel syndrome without diarrhea: Secondary | ICD-10-CM | POA: Insufficient documentation

## 2018-04-01 DIAGNOSIS — Z882 Allergy status to sulfonamides status: Secondary | ICD-10-CM | POA: Insufficient documentation

## 2018-04-01 DIAGNOSIS — Z79899 Other long term (current) drug therapy: Secondary | ICD-10-CM | POA: Insufficient documentation

## 2018-04-01 DIAGNOSIS — N72 Inflammatory disease of cervix uteri: Secondary | ICD-10-CM | POA: Diagnosis not present

## 2018-04-01 DIAGNOSIS — Z8711 Personal history of peptic ulcer disease: Secondary | ICD-10-CM | POA: Insufficient documentation

## 2018-04-01 DIAGNOSIS — M19042 Primary osteoarthritis, left hand: Secondary | ICD-10-CM | POA: Insufficient documentation

## 2018-04-01 DIAGNOSIS — M19011 Primary osteoarthritis, right shoulder: Secondary | ICD-10-CM | POA: Diagnosis not present

## 2018-04-01 DIAGNOSIS — K219 Gastro-esophageal reflux disease without esophagitis: Secondary | ICD-10-CM | POA: Diagnosis not present

## 2018-04-01 DIAGNOSIS — E119 Type 2 diabetes mellitus without complications: Secondary | ICD-10-CM | POA: Insufficient documentation

## 2018-04-01 DIAGNOSIS — F329 Major depressive disorder, single episode, unspecified: Secondary | ICD-10-CM | POA: Diagnosis not present

## 2018-04-01 DIAGNOSIS — Z7984 Long term (current) use of oral hypoglycemic drugs: Secondary | ICD-10-CM | POA: Insufficient documentation

## 2018-04-01 DIAGNOSIS — D259 Leiomyoma of uterus, unspecified: Secondary | ICD-10-CM | POA: Insufficient documentation

## 2018-04-01 DIAGNOSIS — N840 Polyp of corpus uteri: Secondary | ICD-10-CM | POA: Diagnosis not present

## 2018-04-01 HISTORY — PX: DILATATION & CURETTAGE/HYSTEROSCOPY WITH MYOSURE: SHX6511

## 2018-04-01 LAB — GLUCOSE, CAPILLARY
GLUCOSE-CAPILLARY: 193 mg/dL — AB (ref 70–99)
GLUCOSE-CAPILLARY: 145 mg/dL — AB (ref 70–99)

## 2018-04-01 SURGERY — DILATATION & CURETTAGE/HYSTEROSCOPY WITH MYOSURE
Anesthesia: General | Site: Vagina

## 2018-04-01 MED ORDER — FENTANYL CITRATE (PF) 100 MCG/2ML IJ SOLN: 25.0000 ug | INTRAMUSCULAR | Status: DC | PRN

## 2018-04-01 MED ORDER — LACTATED RINGERS IV SOLN
INTRAVENOUS | Status: DC
  Administered 2018-04-01: 07:00:00 via INTRAVENOUS

## 2018-04-01 MED ORDER — LIDOCAINE HCL (CARDIAC) PF 100 MG/5ML IV SOSY
PREFILLED_SYRINGE | INTRAVENOUS | Status: DC | PRN
  Administered 2018-04-01: 60 mg via INTRAVENOUS

## 2018-04-01 MED ORDER — MIDAZOLAM HCL 2 MG/2ML IJ SOLN
INTRAMUSCULAR | Status: DC | PRN
  Administered 2018-04-01: 1 mg via INTRAVENOUS

## 2018-04-01 MED ORDER — ONDANSETRON HCL 4 MG/2ML IJ SOLN
INTRAMUSCULAR | Status: AC
  Filled 2018-04-01: qty 2

## 2018-04-01 MED ORDER — BUPIVACAINE HCL 0.5 % IJ SOLN
INTRAMUSCULAR | Status: DC | PRN
  Administered 2018-04-01: 30 mL

## 2018-04-01 MED ORDER — DEXAMETHASONE SODIUM PHOSPHATE 10 MG/ML IJ SOLN
INTRAMUSCULAR | Status: DC | PRN
  Administered 2018-04-01: 4 mg via INTRAVENOUS

## 2018-04-01 MED ORDER — IBUPROFEN 600 MG PO TABS: 600.0000 mg | ORAL_TABLET | Freq: Four times a day (QID) | ORAL | 1 refills | Status: DC | PRN

## 2018-04-01 MED ORDER — OXYCODONE-ACETAMINOPHEN 5-325 MG PO TABS: 1.0000 | ORAL_TABLET | Freq: Four times a day (QID) | ORAL | 0 refills | Status: DC | PRN

## 2018-04-01 MED ORDER — ONDANSETRON HCL 4 MG/2ML IJ SOLN
INTRAMUSCULAR | Status: DC | PRN
  Administered 2018-04-01: 4 mg via INTRAVENOUS

## 2018-04-01 MED ORDER — ACETAMINOPHEN 325 MG PO TABS: 325.0000 mg | ORAL_TABLET | ORAL | Status: DC | PRN

## 2018-04-01 MED ORDER — PROPOFOL 10 MG/ML IV BOLUS
INTRAVENOUS | Status: AC
  Filled 2018-04-01: qty 20

## 2018-04-01 MED ORDER — MEPERIDINE HCL 25 MG/ML IJ SOLN: 6.2500 mg | INTRAMUSCULAR | Status: DC | PRN

## 2018-04-01 MED ORDER — PROPOFOL 10 MG/ML IV BOLUS
INTRAVENOUS | Status: DC | PRN
  Administered 2018-04-01: 100 mg via INTRAVENOUS

## 2018-04-01 MED ORDER — ONDANSETRON HCL 4 MG/2ML IJ SOLN: 4.0000 mg | Freq: Once | INTRAMUSCULAR | Status: DC | PRN

## 2018-04-01 MED ORDER — OXYCODONE HCL 5 MG PO TABS: 5.0000 mg | ORAL_TABLET | Freq: Once | ORAL | Status: DC | PRN

## 2018-04-01 MED ORDER — BUPIVACAINE HCL (PF) 0.5 % IJ SOLN
INTRAMUSCULAR | Status: AC
  Filled 2018-04-01: qty 30

## 2018-04-01 MED ORDER — FENTANYL CITRATE (PF) 100 MCG/2ML IJ SOLN
INTRAMUSCULAR | Status: DC | PRN
  Administered 2018-04-01: 25 ug via INTRAVENOUS

## 2018-04-01 MED ORDER — OXYCODONE HCL 5 MG/5ML PO SOLN: 5.0000 mg | Freq: Once | ORAL | Status: DC | PRN

## 2018-04-01 MED ORDER — MIDAZOLAM HCL 2 MG/2ML IJ SOLN
INTRAMUSCULAR | Status: AC
  Filled 2018-04-01: qty 2

## 2018-04-01 MED ORDER — DEXAMETHASONE SODIUM PHOSPHATE 4 MG/ML IJ SOLN
INTRAMUSCULAR | Status: AC
  Filled 2018-04-01: qty 1

## 2018-04-01 MED ORDER — FENTANYL CITRATE (PF) 100 MCG/2ML IJ SOLN
INTRAMUSCULAR | Status: AC
  Filled 2018-04-01: qty 2

## 2018-04-01 MED ORDER — LIDOCAINE HCL (CARDIAC) PF 100 MG/5ML IV SOSY
PREFILLED_SYRINGE | INTRAVENOUS | Status: AC
  Filled 2018-04-01: qty 5

## 2018-04-01 MED ORDER — ACETAMINOPHEN 160 MG/5ML PO SOLN: 325.0000 mg | ORAL | Status: DC | PRN

## 2018-04-01 MED FILL — IBUPROFEN 600 MG TABLET: 600 | 8 days supply | Qty: 30 | Fill #0

## 2018-04-01 MED FILL — OXYCODONE-ACETAMINOPHEN 5-3: 5-325 | 2 days supply | Qty: 6 | Fill #0

## 2018-04-01 SURGICAL SUPPLY — 17 items
CNTNR SPEC C3OZ STD GRAD LEK (MISCELLANEOUS) ×2 IMPLANT
CONT SPEC 3OZ W/LID STRL (MISCELLANEOUS) ×4
DEVICE MYOSURE REACH (MISCELLANEOUS) ×3 IMPLANT
DILATOR CANAL MILEX (MISCELLANEOUS) IMPLANT
GLOVE BIO SURGEON STRL SZ 6.5 (GLOVE) ×3 IMPLANT
GLOVE BIO SURGEONS STRL SZ 6.5 (GLOVE) ×1
GLOVE BIOGEL PI IND STRL 7.0 (GLOVE) ×2 IMPLANT
GLOVE BIOGEL PI INDICATOR 7.0 (GLOVE) ×2
GOWN STRL REUS W/TWL LRG LVL3 (GOWN DISPOSABLE) ×8 IMPLANT
NDL SPNL 18GX3.5 QUINCKE PK (NEEDLE) ×1 IMPLANT
NEEDLE SPNL 18GX3.5 QUINCKE PK (NEEDLE) ×4 IMPLANT
PACK VAGINAL MINOR WOMEN LF (CUSTOM PROCEDURE TRAY) ×4 IMPLANT
PAD OB MATERNITY 4.3X12.25 (PERSONAL CARE ITEMS) ×4 IMPLANT
PAD PREP 24X48 CUFFED NSTRL (MISCELLANEOUS) ×4 IMPLANT
TOWEL OR 17X24 6PK STRL BLUE (TOWEL DISPOSABLE) ×8 IMPLANT
TUBING AQUILEX INFLOW (TUBING) ×3 IMPLANT
TUBING AQUILEX OUTFLOW (TUBING) ×3 IMPLANT

## 2018-04-01 NOTE — Op Note (Signed)
04/01/2018  8:23 AM  PATIENT:  Crystal [name]  67 y.o. female  PRE-OPERATIVE DIAGNOSIS:  PMB Thickened Endometrium  POST-OPERATIVE DIAGNOSIS:  PMB, probable submucosal fibroid  PROCEDURE:  Procedure(s): DILATATION & CURETTAGE/HYSTEROSCOPY WITH MYOSURE POLYPECTOMY (N/A)  SURGEON:  Surgeon(s) and Role:    * Lexani Corona, Wilhemina Cash, MD - Primary  ANESTHESIA:   local and general  EBL: minimal  BLOOD ADMINISTERED:none  DRAINS: none   LOCAL MEDICATIONS USED:  MARCAINE     SPECIMEN:  Source of Specimen:  uterine curettings and probable submucosal fibroid  DISPOSITION OF SPECIMEN:  PATHOLOGY  COUNTS:  YES  TOURNIQUET:  * No tourniquets in log *  DICTATION: .Dragon Dictation  PLAN OF CARE: Discharge to home after PACU  PATIENT DISPOSITION:  PACU - hemodynamically stable.   Delay start of Pharmacological VTE agent (>24hrs) due to surgical blood loss or risk of bleeding: not applicable    The risks, benefits, and alternatives of surgery were explained, understood, and accepted. All questions were answered. Consents were signed. In the operating room general anesthesia was applied without complication, and she was placed in the dorsal lithotomy position. Her vagina was prepped and draped in the usual sterile fashion. A Robinson catheter was used to drain her bladder. A bimanual exam revealed a normal size and shape, anteverted mobile uterus. Her adnexa were nonenlarged. A speculum was placed and a single-tooth tenaculum was used to grasp the anterior lip of her cervix. A total of 30 mL of 0.5% Marcaine was used to perform a paracervical block. Her uterus sounded to 8 cm. Her cervix was carefully and slowly dilated to accommodate a small curette. A curettage was done in all quadrants and the fundus of the uterus. A minimal amount of tissue was obtained. I did a diagnostic hysteroscopy and saw a 1.5 cm (probable) submucosal fibroid. I then used the Myosure device to remove the entire lesion.  There was no bleeding noted at the end of the case. She was taken to the recovery room after being extubated. She tolerated the procedure well.

## 2018-04-01 NOTE — Anesthesia Procedure Notes (Signed)
Procedure Name: LMA Insertion Date/Time: 04/01/2018 7:30 AM Performed by: Hewitt Blade, CRNA Pre-anesthesia Checklist: Patient identified, Emergency Drugs available, Suction available and Patient being monitored Patient Re-evaluated:Patient Re-evaluated prior to induction Oxygen Delivery Method: Circle system utilized Preoxygenation: Pre-oxygenation with 100% oxygen Induction Type: IV induction Ventilation: Mask ventilation without difficulty LMA: LMA inserted LMA Size: 4.0 Number of attempts: 1 Placement Confirmation: positive ETCO2 and breath sounds checked- equal and bilateral Tube secured with: Tape Dental Injury: Teeth and Oropharynx as per pre-operative assessment

## 2018-04-01 NOTE — Discharge Instructions (Signed)
DISCHARGE INSTRUCTIONS: D&C / Hysteroscopy/ myosure polypectomy  The following instructions have been prepared to help you care for yourself upon your return home.   Personal hygiene:  Use sanitary pads for vaginal drainage, not tampons.  Shower the day after your procedure.  NO tub baths, pools or Jacuzzis for 2-3 weeks.  Wipe front to back after using the bathroom.  Activity and limitations:  Do NOT drive or operate any equipment for 24 hours. The effects of anesthesia are still present and drowsiness may result.  Do NOT rest in bed all day.  Walking is encouraged.  Walk up and down stairs slowly.  You may resume your normal activity in one to two days or as indicated by your physician.  Sexual activity: NO intercourse for at least 2 weeks after the procedure, or as indicated by your physician.  Diet: Eat a light meal as desired this evening. You may resume your usual diet tomorrow.  Return to work: You may resume your work activities in one to two days or as indicated by your doctor.  What to expect after your surgery: Expect to have vaginal bleeding/discharge for 2-3 days and spotting for up to 10 days. It is not unusual to have soreness for up to 1-2 weeks. You may have a slight burning sensation when you urinate for the first day. Mild cramps may continue for a couple of days. You may have a regular period in 2-6 weeks.  Call your doctor for any of the following:  Excessive vaginal bleeding, saturating and changing one pad every hour.  Inability to urinate 6 hours after discharge from hospital.  Pain not relieved by pain medication.  Fever of 100.4 F or greater.  Unusual vaginal discharge or odor.   Call for an appointment:    Patients signature: ______________________  Nurses signature ________________________  Support person's signature_______________________   Dilation and Curettage or Vacuum Curettage, Care After This sheet gives you information  about how to care for yourself after your procedure. Your health care provider may also give you more specific instructions. If you have problems or questions, contact your health care provider. What can I expect after the procedure? After your procedure, it is common to have:  Mild pain or cramping.  Some vaginal bleeding or spotting.  These may last for up to 2 weeks after your procedure. Follow these instructions at home: Activity   Do not drive or use heavy machinery while taking prescription pain medicine.  Avoid driving for the first 24 hours after your procedure.  Take frequent, short walks, followed by rest periods, throughout the day. Ask your health care provider what activities are safe for you. After 1-2 days, you may be able to return to your normal activities.  Do not lift anything heavier than 10 lb (4.5 kg) until your health care provider approves.  For at least 2 weeks, or as long as told by your health care provider, do not: ? Douche. ? Use tampons. ? Have sexual intercourse. General instructions   Take over-the-counter and prescription medicines only as told by your health care provider. This is especially important if you take blood thinning medicine.  Do not take baths, swim, or use a hot tub until your health care provider approves. Take showers instead of baths.  Wear compression stockings as told by your health care provider. These stockings help to prevent blood clots and reduce swelling in your legs.  It is your responsibility to get the results of your procedure.  Ask your health care provider, or the department performing the procedure, when your results will be ready.  Keep all follow-up visits as told by your health care provider. This is important. Contact a health care provider if:  You have severe cramps that get worse or that do not get better with medicine.  You have severe abdominal pain.  You cannot drink fluids without vomiting.  You  develop pain in a different area of your pelvis.  You have bad-smelling vaginal discharge.  You have a rash. Get help right away if:  You have vaginal bleeding that soaks more than one sanitary pad in 1 hour, for 2 hours in a row.  You pass large blood clots from your vagina.  You have a fever that is above 100.9F (38.0C).  Your abdomen feels very tender or hard.  You have chest pain.  You have shortness of breath.  You cough up blood.  You feel dizzy or light-headed.  You faint.  You have pain in your neck or shoulder area. This information is not intended to replace advice given to you by your health care provider. Make sure you discuss any questions you have with your health care provider. Document Released: 06/27/2000 Document Revised: 02/27/2016 Document Reviewed: 01/31/2016 Elsevier Interactive Patient Education  [employer].

## 2018-04-01 NOTE — Transfer of Care (Signed)
Immediate Anesthesia Transfer of Care Note  Patient: Crystal [name]  Procedure(s) Performed: DILATATION & CURETTAGE/HYSTEROSCOPY WITH MYOSURE POLYPECTOMY (N/A Vagina )  Patient Location: PACU  Anesthesia Type:General  Level of Consciousness: awake, alert  and oriented  Airway & Oxygen Therapy: Patient Spontanous Breathing and Patient connected to nasal cannula oxygen  Post-op Assessment: Report given to RN, Post -op Vital signs reviewed and stable and Patient moving all extremities  Post vital signs: Reviewed and stable  Last Vitals:  Vitals Value Taken Time  BP 175/93 04/01/2018  8:30 AM  Temp    Pulse 90 04/01/2018  8:31 AM  Resp 20 04/01/2018  8:31 AM  SpO2 96 % 04/01/2018  8:31 AM  Vitals shown include unvalidated device data.  Last Pain:  Vitals:   04/01/18 0645  TempSrc: Oral      Patients Stated Pain Goal: 3 (56/70/14 1030)  Complications: No apparent anesthesia complications

## 2018-04-01 NOTE — H&P (Signed)
Crystal [name] is an 67 y.o. female. She is here for a d&c due to a long h/o PMB. Her u/s showed a 1.8 cm endometrium suspicous for carcinoma. The embx was negative.  No LMP recorded (lmp unknown). Patient is postmenopausal.    Past Medical History:  Diagnosis Date  . Acute peptic ulcer of stomach    As a teenager  . Allergy    dust, cock roach,grass,weeds,molds  . Anxiety   . Arthritis    knees, hands, shoulders  . Asthma    02/08/10 FEV1 1.98 (80%), s/p saba 2.22 l/m (90%).nml  . Broken ankle    right foot, wearing a boot  . Cognitive change 06/10/2016  . Dementia   . Depression   . Diabetes mellitus 2003   Type II  . Diverticulosis   . Dyspepsia   . Fatty liver 12/19/2010  . GERD (gastroesophageal reflux disease)   . Hearing loss    bilateral hearing aids   . Herpes simplex virus (HSV) infection   . High cholesterol   . Hyperlipemia   . Hypertension   . Hypothyroidism 09/08/2013  . IBS (irritable bowel syndrome)   . Insomnia   . Internal hemorrhoids   . Joint pain 08/08/2016  . Low back pain 08/10/2016  . OSA (obstructive sleep apnea)    Does not use CPAP - old one broken, new sleep study to be scheduled and then given a new CPAP machine  . Plantar fasciitis    no longer an issue  . Sun-damaged skin 12/09/2016  . SVD (spontaneous vaginal delivery)    x 4  . Vaginal Pap smear, abnormal   . Vitamin D deficiency 03/04/2016  . Wears partial dentures    upper dentures and lower partial    Past Surgical History:  Procedure Laterality Date  . APPENDECTOMY     age 46  . CATARACT EXTRACTION Bilateral 2016   Dr. Bing Plume  . CATARACT EXTRACTION, BILATERAL     12/8 and 12/14  . COLONOSCOPY  06/03/2010, 08/25/2011    severeal - polyps and diverticulosis   . EYE SURGERY     cateracts removed bilateral  . GASTRECTOMY     partial - ulcer as a teen  . HAND SURGERY Right    thumb   . HEMORRHOID BANDING    . KNEE ARTHROSCOPY Left 1998  . NASAL SEPTUM SURGERY    .  RHINOPLASTY    . TOE SURGERY Right 1995    Family History  Problem Relation Age of Onset  . Diabetes Mother        type II  . Heart attack Mother 64  . Stroke Mother   . Hyperlipidemia Mother   . Colon cancer Sister   . Cancer Sister        GI  . Heart disease Sister   . Asthma Maternal Grandmother   . Mental illness Son        PTSD from [employer]  . Parkinsonism Father   . Cancer Sister        stage 4 bladder cancer  . Colon polyps Sister   . Ulcers Unknown        bleeding gastric  . Heart defect Unknown        child  . Other Unknown        perforated bowel, child  . Cancer Brother        pancreatic cancer  . Amblyopia Neg Hx   . Blindness Neg Hx   .  Cataracts Neg Hx   . Glaucoma Neg Hx   . Macular degeneration Neg Hx   . Retinal detachment Neg Hx   . Strabismus Neg Hx   . Retinitis pigmentosa Neg Hx     Social History:  reports that she has never smoked. She has never used smokeless tobacco. She reports that she drinks alcohol. She reports that she does not use drugs.  Allergies:  Allergies  Allergen Reactions  . Compazine [Prochlorperazine Edisylate]     Comma for 3 days  . Sulfa Antibiotics Itching    severe itching  . Tetanus Toxoid Other (See Comments)    convulsions    Medications Prior to Admission  Medication Sig Dispense Refill Last Dose  . acetaminophen (TYLENOL) 650 MG CR tablet Take 1,950 mg by mouth daily as needed for pain.   Past Week at Unknown time  . albuterol (VENTOLIN HFA) 108 (90 Base) MCG/ACT inhaler INHALE 2 PUFFS BY MOUTH INTO THE LUNGS EVERY 4 (FOUR) HOURS AS NEEDED FOR SHORTNESS OF BREATH AND WHEEZING 18 g 1 Past Week at Unknown time  . atenolol (TENORMIN) 25 MG tablet Take 1 tablet (25 mg total) by mouth daily. 90 tablet 3 Past Week at Unknown time  . atorvastatin (LIPITOR) 80 MG tablet TAKE 1 TABLET EVERY DAY (Patient taking differently: Take 80 mg by mouth at bedtime. ) 90 tablet 2 Past Week at Unknown time  . cetirizine (ZYRTEC)  10 MG tablet Take 1 tablet (10 mg total) by mouth daily as needed for allergies. 30 tablet 11 Past Week at Unknown time  . Cholecalciferol (VITAMIN D3) 2000 units TABS Take 1 tablet by mouth daily.   Past Week at Unknown time  . donepezil (ARICEPT) 10 MG tablet Take 1 tablet (10 mg total) by mouth at bedtime. 30 tablet 0 Past Week at Unknown time  . escitalopram (LEXAPRO) 20 MG tablet Take 1 tablet (20 mg total) by mouth daily. 30 tablet 0 Past Week at Unknown time  . fenofibrate 160 MG tablet TAKE 1 TABLET (160 MG TOTAL) BY MOUTH DAILY. 90 tablet 2 Past Week at Unknown time  . fluticasone (FLONASE) 50 MCG/ACT nasal spray Place 2 sprays into both nostrils daily. 16 g 3 Past Week at Unknown time  . glimepiride (AMARYL) 4 MG tablet Take 1 tablet (4 mg total) by mouth 2 (two) times daily. (Patient taking differently: Take 4 mg by mouth daily with breakfast. ) 180 tablet 1 Past Week at Unknown time  . levothyroxine (SYNTHROID, LEVOTHROID) 25 MCG tablet TAKE 1 TABLET (25 MCG TOTAL) BY MOUTH DAILY BEFORE BREAKFAST. 90 tablet 2 Past Week at Unknown time  . losartan (COZAAR) 50 MG tablet TAKE 1 TABLET EVERY DAY (Patient taking differently: Take 50 mg by mouth daily. ) 90 tablet 1 Past Week at Unknown time  . montelukast (SINGULAIR) 10 MG tablet Take 1 tablet (10 mg total) by mouth at bedtime as needed (allergies). 30 tablet 3 Past Week at Unknown time  . Multiple Vitamins-Minerals (CENTRUM SILVER PO) Take 1 tablet by mouth daily.     Past Week at Unknown time  . ondansetron (ZOFRAN ODT) 4 MG disintegrating tablet Take one tab by mouth Q6hr prn nausea.  Dissolve under tongue. (Patient taking differently: Take 4 mg by mouth every 6 (six) hours as needed for nausea. Take one tab by mouth Q6hr prn nausea.  Dissolve under tongue.) 12 tablet 0 Past Month at Unknown time  . pantoprazole (PROTONIX) 40 MG tablet TAKE 1 TABLET  EVERY DAY (Patient taking differently: Take 40 mg by mouth daily. ) 90 tablet 0 Past Week at  Unknown time  . Probiotic Product (ALIGN) 4 MG CAPS Take 1 capsule by mouth daily. Reported on 07/02/2015   Past Week at Unknown time  . ramelteon (ROZEREM) 8 MG tablet Take 1 tablet (8 mg total) by mouth at bedtime. 90 tablet 1 Past Week at Unknown time  . sitaGLIPtin (JANUVIA) 100 MG tablet Take 1 tablet (100 mg total) by mouth daily. 90 tablet 1 Past Week at Unknown time  . triamcinolone cream (KENALOG) 0.1 % Apply 1 application topically 2 (two) times daily.   0 Taking  . valACYclovir (VALTREX) 1000 MG tablet Take 1,000 mg by mouth daily as needed (outbreaks).   6 Past Week at Unknown time  . ACCU-CHEK SOFTCLIX LANCETS lancets Use as directed twice daily to check blood sugar. DX E11.9 200 each 3 Taking  . Alcohol Swabs (B-D SINGLE USE SWABS REGULAR) PADS Use as directed to check blood sugar. DX E11.9 100 each 3 Taking  . Blood Glucose Calibration (ACCU-CHEK AVIVA) SOLN Use as directed to check blood sugar.  DX E11.9 1 each 3 Taking  . Blood Glucose Monitoring Suppl (ACCU-CHEK AVIVA PLUS) w/Device KIT USE AS DIRECTED TO CHECK BLOOD SUGAR. 1 kit 0 Taking  . dicyclomine (BENTYL) 10 MG capsule TAKE 1 CAPSULE EVERY 4-6 HOURS AS NEEDED FOR ABDOMINAL PAIN AND CRAMPING OR DIARRHEA (Patient taking differently: Take 10 mg by mouth See admin instructions. TAKE 1 CAPSULE EVERY 4-6 HOURS AS NEEDED FOR ABDOMINAL PAIN AND CRAMPING OR DIARRHEA) 60 capsule 1 More than a month at Unknown time  . famciclovir (FAMVIR) 500 MG tablet 1000 mg po bid x 24 hours then 250 mg po bid every day (Patient taking differently: Take 250 mg by mouth 2 (two) times daily. ) 35 tablet 3 More than a month at Unknown time  . glucose blood (ACCU-CHEK AVIVA PLUS) test strip Use as directed twice daily to check blood sugar.  DX E11.9 200 each 3 Taking  . HYDROcodone-acetaminophen (NORCO/VICODIN) 5-325 MG tablet Take 1 tablet by mouth every 4 (four) hours as needed. (Patient taking differently: Take 1 tablet by mouth every 4 (four) hours as  needed for moderate pain. ) 30 tablet 0 More than a month at Unknown time  . misoprostol (CYTOTEC) 200 MCG tablet TAke 2 tabs PO the night prior to procedure (Patient not taking: Reported on 03/30/2018) 2 tablet 0 Not Taking at Unknown time  . Vitamin D, Ergocalciferol, (DRISDOL) 50000 units CAPS capsule Take 1 capsule (50,000 Units total) by mouth every 7 (seven) days for 12 doses. (Patient not taking: Reported on 03/30/2018) 4 capsule 3 Not Taking at Unknown time    ROS  Blood pressure (!) 156/99, pulse 81, temperature 97.8 F (36.6 C), temperature source Oral, resp. rate 16, height '5\' 2"'  (1.575 m), weight 71.7 kg, SpO2 98 %. Physical Exam Heart- rrr Lungs- CTAB Abd- benign No results found for this or any previous visit (from the past 24 hour(s)).  Dg Ankle Complete Right  Result Date: 03/30/2018 CLINICAL DATA:  Ankle injury with pain. EXAM: RIGHT ANKLE - COMPLETE 3+ VIEW COMPARISON:  03/18/2018 FINDINGS: No fracture. No subluxation or dislocation. No worrisome lytic or sclerotic osseous abnormality. Persistent soft tissue swelling evident. IMPRESSION: No fracture. Cortical irregularity along the lateral talus is similar to prior and may be from an avulsion injury. Electronically Signed   By: Misty Stanley M.D.   On:  03/30/2018 10:45    Assessment/Plan: PMB, thickened endometrium- for d&c  She understands the risks of surgery, including, but not to infection, bleeding, DVTs, damage to bowel, bladder, ureters. She wishes to proceed.     Emily Filbert 04/01/2018, 7:21 AM

## 2018-04-01 NOTE — Anesthesia Postprocedure Evaluation (Signed)
Anesthesia Post Note  Patient: Crystal [name]  Procedure(s) Performed: DILATATION & CURETTAGE/HYSTEROSCOPY WITH MYOSURE POLYPECTOMY (N/A Vagina )     Patient location during evaluation: PACU Anesthesia Type: General Level of consciousness: awake and alert Pain management: pain level controlled Vital Signs Assessment: post-procedure vital signs reviewed and stable Respiratory status: spontaneous breathing, nonlabored ventilation, respiratory function stable and patient connected to nasal cannula oxygen Cardiovascular status: blood pressure returned to baseline and stable Postop Assessment: no apparent nausea or vomiting Anesthetic complications: no    Last Vitals:  Vitals:   04/01/18 0930 04/01/18 1015  BP: (!) 154/90 (!) 162/94  Pulse: 91 82  Resp: 14 16  Temp:  36.6 C  SpO2: 97% 95%    Last Pain:  Vitals:   04/01/18 1015  TempSrc:   PainSc: 0-No pain   Pain Goal: Patients Stated Pain Goal: 3 (04/01/18 0645)               Daris Harkins

## 2018-04-02 ENCOUNTER — Encounter (HOSPITAL_COMMUNITY): Payer: Self-pay | Admitting: Obstetrics & Gynecology

## 2018-04-05 ENCOUNTER — Encounter: Payer: Self-pay | Admitting: Family Medicine

## 2018-04-05 ENCOUNTER — Ambulatory Visit: Payer: Medicare HMO | Admitting: Obstetrics & Gynecology

## 2018-04-05 ENCOUNTER — Encounter: Payer: Self-pay | Admitting: Obstetrics & Gynecology

## 2018-04-05 VITALS — BP 154/96 | HR 87 | Ht 62.0 in | Wt 158.0 lb

## 2018-04-05 DIAGNOSIS — Z9889 Other specified postprocedural states: Secondary | ICD-10-CM

## 2018-04-05 NOTE — Progress Notes (Signed)
   Subjective:    Patient ID: Crystal [name], female    DOB: 1950/12/24, 67 y.o.   MRN: [id number]  HPI  67 yo married lady here for a post op visit. She had a d&c last week for PMB and thickened endometrium. It turned out that she had a submucosal fibroid which I removed with Myosure. She has only a small amount of bleeding now, no pain.  Review of Systems     Objective:   Physical Exam Breathing, conversing normally  Ambulating with a scooter due to a right foot/ankle injury Well nourished, well hydrated White female, no apparent distress Pathology benign    Assessment & Plan:  Post op- doing well Come back for annual exam in a year

## 2018-04-13 ENCOUNTER — Encounter: Payer: Self-pay | Admitting: Family Medicine

## 2018-04-13 ENCOUNTER — Ambulatory Visit (INDEPENDENT_AMBULATORY_CARE_PROVIDER_SITE_OTHER): Payer: Worker's Compensation | Admitting: Family Medicine

## 2018-04-13 VITALS — BP 127/74 | HR 72 | Ht 62.0 in | Wt 158.0 lb

## 2018-04-13 DIAGNOSIS — S99911D Unspecified injury of right ankle, subsequent encounter: Secondary | ICD-10-CM | POA: Diagnosis not present

## 2018-04-13 NOTE — Progress Notes (Signed)
PCP: [name], MD  Subjective:   HPI: Patient is a 67 y.o. [name] here for right foot/ankle injury.  9/10: Patient reports while at work 9/5 she accidentally fell, inverted her right ankle badly and struck middle of low back on counter as she fell down. Pain level in her splint is now 0/10 but was sharp. Unable to bear weight initially. Currently in wheelchair. + swelling and bruising. No prior injuries. Intermittently had numbness.  9/17: Patient reports she's doing well. Pain currently at 0/10 level. Wearing boot regularly. Using knee scooter though she has put a little weight on her foot. Taking aleve some. Is elevating but no needed the norco or icing. Pain is an achiness when this comes on. No numbness.  10/1: Patient returns reporting she's much better. Pain level 0/10. Wearing cam walker and using knee scooter but reports she's able to bear weight without pain in short bursts. Not taking anything for pain. No numbness.  Past Medical History:  Diagnosis Date  . Acute peptic ulcer of stomach    As a teenager  . Allergy    dust, cock roach,grass,weeds,molds  . Anxiety   . Arthritis    knees, hands, shoulders  . Asthma    02/08/10 FEV1 1.98 (80%), s/p saba 2.22 l/m (90%).nml  . Broken ankle    right foot, wearing a boot  . Cognitive change 06/10/2016  . Dementia ([place])   . Depression   . Diabetes mellitus 2003   Type II  . Diverticulosis   . Dyspepsia   . Fatty liver 12/19/2010  . GERD (gastroesophageal reflux disease)   . Hearing loss    bilateral hearing aids   . Herpes simplex virus (HSV) infection   . High cholesterol   . Hyperlipemia   . Hypertension   . Hypothyroidism 09/08/2013  . IBS (irritable bowel syndrome)   . Insomnia   . Internal hemorrhoids   . Joint pain 08/08/2016  . Low back pain 08/10/2016  . OSA (obstructive sleep apnea)    Does not use CPAP - old one broken, new sleep study to be scheduled and then given a new CPAP machine   . Plantar fasciitis    no longer an issue  . Sun-damaged skin 12/09/2016  . SVD (spontaneous vaginal delivery)    x 4  . Vaginal Pap smear, abnormal   . Vitamin D deficiency 03/04/2016  . Wears partial dentures    upper dentures and lower partial    Current Outpatient Medications on File Prior to Visit  Medication Sig Dispense Refill  . ACCU-CHEK SOFTCLIX LANCETS lancets Use as directed twice daily to check blood sugar. DX E11.9 200 each 3  . albuterol (VENTOLIN HFA) 108 (90 Base) MCG/ACT inhaler INHALE 2 PUFFS BY MOUTH INTO THE LUNGS EVERY 4 (FOUR) HOURS AS NEEDED FOR SHORTNESS OF BREATH AND WHEEZING 18 g 1  . Alcohol Swabs (B-D SINGLE USE SWABS REGULAR) PADS Use as directed to check blood sugar. DX E11.9 100 each 3  . atenolol (TENORMIN) 25 MG tablet Take 1 tablet (25 mg total) by mouth daily. 90 tablet 3  . atorvastatin (LIPITOR) 80 MG tablet TAKE 1 TABLET EVERY DAY (Patient taking differently: Take 80 mg by mouth at bedtime. ) 90 tablet 2  . Blood Glucose Calibration (ACCU-CHEK AVIVA) SOLN Use as directed to check blood sugar.  DX E11.9 1 each 3  . Blood Glucose Monitoring Suppl (ACCU-CHEK AVIVA PLUS) w/Device KIT USE AS DIRECTED TO CHECK BLOOD SUGAR. 1  kit 0  . cetirizine (ZYRTEC) 10 MG tablet Take 1 tablet (10 mg total) by mouth daily as needed for allergies. 30 tablet 11  . Cholecalciferol (VITAMIN D3) 2000 units TABS Take 1 tablet by mouth daily.    Marland Kitchen dicyclomine (BENTYL) 10 MG capsule TAKE 1 CAPSULE EVERY 4-6 HOURS AS NEEDED FOR ABDOMINAL PAIN AND CRAMPING OR DIARRHEA (Patient taking differently: Take 10 mg by mouth See admin instructions. TAKE 1 CAPSULE EVERY 4-6 HOURS AS NEEDED FOR ABDOMINAL PAIN AND CRAMPING OR DIARRHEA) 60 capsule 1  . donepezil (ARICEPT) 10 MG tablet Take 1 tablet (10 mg total) by mouth at bedtime. 30 tablet 0  . escitalopram (LEXAPRO) 20 MG tablet Take 1 tablet (20 mg total) by mouth daily. 30 tablet 0  . famciclovir (FAMVIR) 500 MG tablet 1000 mg po bid x 24  hours then 250 mg po bid every day (Patient taking differently: Take 250 mg by mouth 2 (two) times daily. ) 35 tablet 3  . fenofibrate 160 MG tablet TAKE 1 TABLET (160 MG TOTAL) BY MOUTH DAILY. 90 tablet 2  . fluticasone (FLONASE) 50 MCG/ACT nasal spray Place 2 sprays into both nostrils daily. 16 g 3  . glimepiride (AMARYL) 4 MG tablet Take 1 tablet (4 mg total) by mouth 2 (two) times daily. (Patient taking differently: Take 4 mg by mouth daily with breakfast. ) 180 tablet 1  . glucose blood (ACCU-CHEK AVIVA PLUS) test strip Use as directed twice daily to check blood sugar.  DX E11.9 200 each 3  . HYDROcodone-acetaminophen (NORCO/VICODIN) 5-325 MG tablet Take 1 tablet by mouth every 4 (four) hours as needed. (Patient taking differently: Take 1 tablet by mouth every 4 (four) hours as needed for moderate pain. ) 30 tablet 0  . ibuprofen (ADVIL,MOTRIN) 600 MG tablet Take 1 tablet (600 mg total) by mouth every 6 (six) hours as needed. 30 tablet 1  . levothyroxine (SYNTHROID, LEVOTHROID) 25 MCG tablet TAKE 1 TABLET (25 MCG TOTAL) BY MOUTH DAILY BEFORE BREAKFAST. 90 tablet 2  . losartan (COZAAR) 50 MG tablet TAKE 1 TABLET EVERY DAY (Patient taking differently: Take 50 mg by mouth daily. ) 90 tablet 1  . montelukast (SINGULAIR) 10 MG tablet Take 1 tablet (10 mg total) by mouth at bedtime as needed (allergies). 30 tablet 3  . Multiple Vitamins-Minerals (CENTRUM SILVER PO) Take 1 tablet by mouth daily.      . ondansetron (ZOFRAN ODT) 4 MG disintegrating tablet Take one tab by mouth Q6hr prn nausea.  Dissolve under tongue. (Patient taking differently: Take 4 mg by mouth every 6 (six) hours as needed for nausea. Take one tab by mouth Q6hr prn nausea.  Dissolve under tongue.) 12 tablet 0  . oxyCODONE-acetaminophen (PERCOCET/ROXICET) 5-325 MG tablet Take 1 tablet by mouth every 6 (six) hours as needed. 8 tablet 0  . oxyCODONE-acetaminophen (PERCOCET/ROXICET) 5-325 MG tablet Take 1 tablet by mouth every 6 (six) hours  as needed. 6 tablet 0  . pantoprazole (PROTONIX) 40 MG tablet TAKE 1 TABLET EVERY DAY (Patient taking differently: Take 40 mg by mouth daily. ) 90 tablet 0  . Probiotic Product (ALIGN) 4 MG CAPS Take 1 capsule by mouth daily. Reported on 07/02/2015    . ramelteon (ROZEREM) 8 MG tablet Take 1 tablet (8 mg total) by mouth at bedtime. 90 tablet 1  . sitaGLIPtin (JANUVIA) 100 MG tablet Take 1 tablet (100 mg total) by mouth daily. 90 tablet 1  . triamcinolone cream (KENALOG) 0.1 % Apply 1  application topically 2 (two) times daily.   0   No current facility-administered medications on file prior to visit.     Past Surgical History:  Procedure Laterality Date  . APPENDECTOMY     age 73  . CATARACT EXTRACTION Bilateral 2016   Dr. Bing Plume  . CATARACT EXTRACTION, BILATERAL     12/8 and 12/14  . COLONOSCOPY  06/03/2010, 08/25/2011    severeal - polyps and diverticulosis   . DILATATION & CURETTAGE/HYSTEROSCOPY WITH MYOSURE N/A 04/01/2018   Procedure: DILATATION & CURETTAGE/HYSTEROSCOPY WITH MYOSURE POLYPECTOMY;  Surgeon: Emily Filbert, MD;  Location: [facility];  Service: Gynecology;  Laterality: N/A;  . EYE SURGERY     cateracts removed bilateral  . GASTRECTOMY     partial - ulcer as a teen  . HAND SURGERY Right    thumb   . HEMORRHOID BANDING    . KNEE ARTHROSCOPY Left 1998  . NASAL SEPTUM SURGERY    . RHINOPLASTY    . TOE SURGERY Right 1995    Allergies  Allergen Reactions  . Compazine [Prochlorperazine Edisylate]     Comma for 3 days  . Sulfa Antibiotics Itching    severe itching  . Tetanus Toxoid Other (See Comments)    convulsions    Social History   Socioeconomic History  . Marital status: Widowed    Spouse name: Not on file  . Number of children: 4  . Years of education: Not on file  . Highest education level: Not on file  Occupational History  . Occupation: retired    Fish farm manager: [employer]  Social Needs  . Financial resource strain: Not on file  . Food insecurity:    Worry:  Not on file    Inability: Not on file  . Transportation needs:    Medical: Not on file    Non-medical: Not on file  Tobacco Use  . Smoking status: Never Smoker  . Smokeless tobacco: Never Used  Substance and Sexual Activity  . Alcohol use: Yes    Alcohol/week: 0.0 standard drinks    Comment: Rare occasions   . Drug use: No  . Sexual activity: Not Currently    Birth control/protection: Post-menopausal  Lifestyle  . Physical activity:    Days per week: Not on file    Minutes per session: Not on file  . Stress: Not on file  Relationships  . Social connections:    Talks on phone: Not on file    Gets together: Not on file    Attends religious service: Not on file    Active member of club or organization: Not on file    Attends meetings of clubs or organizations: Not on file    Relationship status: Not on file  . Intimate partner violence:    Fear of current or ex partner: Not on file    Emotionally abused: Not on file    Physically abused: Not on file    Forced sexual activity: Not on file  Other Topics Concern  . Not on file  Social History Narrative   No caffeine drinks daily     Family History  Problem Relation Age of Onset  . Diabetes Mother        type II  . Heart attack Mother 17  . Stroke Mother   . Hyperlipidemia Mother   . Colon cancer Sister   . Cancer Sister        GI  . Heart disease Sister   . Asthma Maternal  Grandmother   . Mental illness Son        PTSD from [employer]  . Parkinsonism Father   . Cancer Sister        stage 4 bladder cancer  . Colon polyps Sister   . Ulcers Unknown        bleeding gastric  . Heart defect Unknown        child  . Other Unknown        perforated bowel, child  . Cancer Brother        pancreatic cancer  . Amblyopia Neg Hx   . Blindness Neg Hx   . Cataracts Neg Hx   . Glaucoma Neg Hx   . Macular degeneration Neg Hx   . Retinal detachment Neg Hx   . Strabismus Neg Hx   . Retinitis pigmentosa Neg Hx     BP  127/74   Pulse Crystal   Ht '5\' 2"'  (1.575 m)   Wt 158 lb (71.7 kg)   LMP  (LMP Unknown)   BMI 28.90 kg/m   Review of Systems: See HPI above.     Objective:  Physical Exam:  Gen: NAD, comfortable in exam room  Right foot/ankle: Mild swelling anterior and lateral ankle.  Minimal bruising distal dorsal foot.  No other deformity. FROM with 5/5 strength - mild pain full IR felt laterally. TTP over lateral talus, lateral ankle joint.  No other tenderness. Negative ant drawer and talar tilt.   Negative syndesmotic compression. Thompsons test negative. NV intact distally.  Assessment & Plan:  1. Right ankle injury - 2/2 lateral process fracture of talus.  Clinically much improved.  Start ambulating in cam walker without the knee scooter for next 2 weeks.  Come out of this to supportive shoe prior to follow up in 2 1/2 weeks.  Aleve if needed.  Out of work in meantime.

## 2018-04-13 NOTE — Patient Instructions (Addendum)
You're doing great. Wear boot when up and walking around. Try to stop using the knee scooter now. Ok to come out of boot to do motion exercises now too in addition to if you're just sitting on couch, to wash area, and when sleeping. Aleve if needed. In 2 weeks stop using the boot and switch to a supportive shoe when up and walking around. Then follow up with me 2-3 days after this - I'm anticipating clearing you to return to work at that time.

## 2018-04-21 NOTE — Progress Notes (Signed)
[facility] Note  04/26/2018     CHIEF COMPLAINT Patient presents for Retina Follow Up   HISTORY OF PRESENT ILLNESS: Crystal [name] is a 67 y.o. female who presents to the clinic today for:   HPI    Retina Follow Up    Patient presents with  Retinal Break/Detachment.  In right eye.  This started 14 months ago.  Severity is moderate.  Duration of 12 months.  I, the attending physician,  performed the HPI with the patient and updated documentation appropriately.          Comments    12 month retinal eval for s/p laser retinopexy OD. Patient denies floaters/flashes OU. Got new glasses Rx about 6 months ago. Vision is clearer with glasses on. CBG 105-135. Last A1c was 7.2 several months ago.        Last edited by Bernarda Caffey, MD on 04/26/2018 12:05 PM. (History)    Pt feels OU VA is "a little weaker, since I got my glasses"; Pt states OD VA is worse than OS; Pt denies being on any gtts;   Referring physician: [name], MD [address] [place], [place] [address]  HISTORICAL INFORMATION:   Selected notes from the MEDICAL RECORD NUMBER Referral from Dr. Bing Plume for retina eval: - last exam by [facility], 03/17/17 - 2 wk history of floaters OU - hx of DM2 since 2003 - pseudophakia OU (complex CEIOL OD, 12.14.16, OS: 11.28.16)   CURRENT MEDICATIONS: Current Outpatient Medications (Ophthalmic Drugs)  Medication Sig  . ketorolac (ACULAR) 0.5 % ophthalmic solution Place 1 drop into the right eye 4 (four) times daily.  . prednisoLONE acetate (PRED FORTE) 1 % ophthalmic suspension Place 1 drop into the right eye 4 (four) times daily.   No current facility-administered medications for this visit.  (Ophthalmic Drugs)   Current Outpatient Medications (Other)  Medication Sig  . ACCU-CHEK SOFTCLIX LANCETS lancets Use as directed twice daily to check blood sugar. DX E11.9  . albuterol (VENTOLIN HFA) 108 (90 Base) MCG/ACT inhaler INHALE 2  PUFFS BY MOUTH INTO THE LUNGS EVERY 4 (FOUR) HOURS AS NEEDED FOR SHORTNESS OF BREATH AND WHEEZING  . Alcohol Swabs (B-D SINGLE USE SWABS REGULAR) PADS Use as directed to check blood sugar. DX E11.9  . atenolol (TENORMIN) 25 MG tablet Take 1 tablet (25 mg total) by mouth daily.  Marland Kitchen atorvastatin (LIPITOR) 80 MG tablet TAKE 1 TABLET EVERY DAY (Patient taking differently: Take 80 mg by mouth at bedtime. )  . Blood Glucose Calibration (ACCU-CHEK AVIVA) SOLN Use as directed to check blood sugar.  DX E11.9  . Blood Glucose Monitoring Suppl (ACCU-CHEK AVIVA PLUS) w/Device KIT USE AS DIRECTED TO CHECK BLOOD SUGAR.  . cetirizine (ZYRTEC) 10 MG tablet Take 1 tablet (10 mg total) by mouth daily as needed for allergies.  . Cholecalciferol (VITAMIN D3) 2000 units TABS Take 1 tablet by mouth daily.  Marland Kitchen dicyclomine (BENTYL) 10 MG capsule TAKE 1 CAPSULE EVERY 4-6 HOURS AS NEEDED FOR ABDOMINAL PAIN AND CRAMPING OR DIARRHEA (Patient taking differently: Take 10 mg by mouth See admin instructions. TAKE 1 CAPSULE EVERY 4-6 HOURS AS NEEDED FOR ABDOMINAL PAIN AND CRAMPING OR DIARRHEA)  . donepezil (ARICEPT) 10 MG tablet Take 1 tablet (10 mg total) by mouth at bedtime.  Marland Kitchen escitalopram (LEXAPRO) 20 MG tablet Take 1 tablet (20 mg total) by mouth daily.  . famciclovir (FAMVIR) 500 MG tablet 1000 mg po bid  x 24 hours then 250 mg po bid every day (Patient taking differently: Take 250 mg by mouth 2 (two) times daily. )  . fenofibrate 160 MG tablet TAKE 1 TABLET (160 MG TOTAL) BY MOUTH DAILY.  . fluticasone (FLONASE) 50 MCG/ACT nasal spray Place 2 sprays into both nostrils daily.  Marland Kitchen glimepiride (AMARYL) 4 MG tablet Take 1 tablet (4 mg total) by mouth 2 (two) times daily. (Patient taking differently: Take 4 mg by mouth daily with breakfast. )  . glucose blood (ACCU-CHEK AVIVA PLUS) test strip Use as directed twice daily to check blood sugar.  DX E11.9  . HYDROcodone-acetaminophen (NORCO/VICODIN) 5-325 MG tablet Take 1 tablet by mouth  every 4 (four) hours as needed. (Patient taking differently: Take 1 tablet by mouth every 4 (four) hours as needed for moderate pain. )  . ibuprofen (ADVIL,MOTRIN) 600 MG tablet Take 1 tablet (600 mg total) by mouth every 6 (six) hours as needed.  Marland Kitchen levothyroxine (SYNTHROID, LEVOTHROID) 25 MCG tablet TAKE 1 TABLET (25 MCG TOTAL) BY MOUTH DAILY BEFORE BREAKFAST.  Marland Kitchen losartan (COZAAR) 50 MG tablet TAKE 1 TABLET EVERY DAY (Patient taking differently: Take 50 mg by mouth daily. )  . montelukast (SINGULAIR) 10 MG tablet Take 1 tablet (10 mg total) by mouth at bedtime as needed (allergies).  . Multiple Vitamins-Minerals (CENTRUM SILVER PO) Take 1 tablet by mouth daily.    . ondansetron (ZOFRAN ODT) 4 MG disintegrating tablet Take one tab by mouth Q6hr prn nausea.  Dissolve under tongue. (Patient taking differently: Take 4 mg by mouth every 6 (six) hours as needed for nausea. Take one tab by mouth Q6hr prn nausea.  Dissolve under tongue.)  . pantoprazole (PROTONIX) 40 MG tablet TAKE 1 TABLET EVERY DAY (Patient taking differently: Take 40 mg by mouth daily. )  . Probiotic Product (ALIGN) 4 MG CAPS Take 1 capsule by mouth daily. Reported on 07/02/2015  . ramelteon (ROZEREM) 8 MG tablet Take 1 tablet (8 mg total) by mouth at bedtime.  . sitaGLIPtin (JANUVIA) 100 MG tablet Take 1 tablet (100 mg total) by mouth daily.  Marland Kitchen triamcinolone cream (KENALOG) 0.1 % Apply 1 application topically 2 (two) times daily.   Marland Kitchen oxyCODONE-acetaminophen (PERCOCET/ROXICET) 5-325 MG tablet Take 1 tablet by mouth every 6 (six) hours as needed. (Patient not taking: Reported on 04/26/2018)  . oxyCODONE-acetaminophen (PERCOCET/ROXICET) 5-325 MG tablet Take 1 tablet by mouth every 6 (six) hours as needed. (Patient not taking: Reported on 04/26/2018)   No current facility-administered medications for this visit.  (Other)      REVIEW OF SYSTEMS: ROS    Positive for: Gastrointestinal, Musculoskeletal, Endocrine, Cardiovascular, Eyes,  Respiratory, Psychiatric   Negative for: Constitutional, Neurological, Skin, Genitourinary, HENT, Allergic/Imm, Heme/Lymph   Last edited by Roselee Nova D on 04/26/2018  9:38 AM. (History)       ALLERGIES Allergies  Allergen Reactions  . Compazine [Prochlorperazine Edisylate]     Comma for 3 days  . Sulfa Antibiotics Itching    severe itching  . Tetanus Toxoid Other (See Comments)    convulsions    PAST MEDICAL HISTORY Past Medical History:  Diagnosis Date  . Acute peptic ulcer of stomach    As a teenager  . Allergy    dust, cock roach,grass,weeds,molds  . Anxiety   . Arthritis    knees, hands, shoulders  . Asthma    02/08/10 FEV1 1.98 (80%), s/p saba 2.22 l/m (90%).nml  . Broken ankle    right foot, wearing a  boot  . Cognitive change 06/10/2016  . Dementia ([place])   . Depression   . Diabetes mellitus 2003   Type II  . Diverticulosis   . Dyspepsia   . Fatty liver 12/19/2010  . GERD (gastroesophageal reflux disease)   . Hearing loss    bilateral hearing aids   . Herpes simplex virus (HSV) infection   . High cholesterol   . Hyperlipemia   . Hypertension   . Hypothyroidism 09/08/2013  . IBS (irritable bowel syndrome)   . Insomnia   . Internal hemorrhoids   . Joint pain 08/08/2016  . Low back pain 08/10/2016  . OSA (obstructive sleep apnea)    Does not use CPAP - old one broken, new sleep study to be scheduled and then given a new CPAP machine  . Plantar fasciitis    no longer an issue  . Sun-damaged skin 12/09/2016  . SVD (spontaneous vaginal delivery)    x 4  . Vaginal Pap smear, abnormal   . Vitamin D deficiency 03/04/2016  . Wears partial dentures    upper dentures and lower partial   Past Surgical History:  Procedure Laterality Date  . APPENDECTOMY     age 32  . CATARACT EXTRACTION Bilateral 2016   Dr. Bing Plume  . CATARACT EXTRACTION, BILATERAL     12/8 and 12/14  . COLONOSCOPY  06/03/2010, 08/25/2011    severeal - polyps and diverticulosis   .  DILATATION & CURETTAGE/HYSTEROSCOPY WITH MYOSURE N/A 04/01/2018   Procedure: DILATATION & CURETTAGE/HYSTEROSCOPY WITH MYOSURE POLYPECTOMY;  Surgeon: Emily Filbert, MD;  Location: [facility];  Service: Gynecology;  Laterality: N/A;  . EYE SURGERY     cateracts removed bilateral  . GASTRECTOMY     partial - ulcer as a teen  . HAND SURGERY Right    thumb   . HEMORRHOID BANDING    . KNEE ARTHROSCOPY Left 1998  . NASAL SEPTUM SURGERY    . RHINOPLASTY    . TOE SURGERY Right 1995    FAMILY HISTORY Family History  Problem Relation Age of Onset  . Diabetes Mother        type II  . Heart attack Mother 88  . Stroke Mother   . Hyperlipidemia Mother   . Colon cancer Sister   . Cancer Sister        GI  . Heart disease Sister   . Asthma Maternal Grandmother   . Mental illness Son        PTSD from [employer]  . Parkinsonism Father   . Cancer Sister        stage 4 bladder cancer  . Colon polyps Sister   . Ulcers Unknown        bleeding gastric  . Heart defect Unknown        child  . Other Unknown        perforated bowel, child  . Cancer Brother        pancreatic cancer  . Amblyopia Neg Hx   . Blindness Neg Hx   . Cataracts Neg Hx   . Glaucoma Neg Hx   . Macular degeneration Neg Hx   . Retinal detachment Neg Hx   . Strabismus Neg Hx   . Retinitis pigmentosa Neg Hx     SOCIAL HISTORY Social History   Tobacco Use  . Smoking status: Never Smoker  . Smokeless tobacco: Never Used  Substance Use Topics  . Alcohol use: Yes    Alcohol/week: 0.0 standard drinks  Comment: Rare occasions   . Drug use: No         OPHTHALMIC EXAM:  Base Eye Exam    Visual Acuity (Snellen - Linear)      Right Left   Dist cc 20/40 -2 20/25   Dist ph cc 20/40 +2 NI   Correction:  Glasses       Tonometry (Tonopen, 9:56 AM)      Right Left   Pressure 18 18       Pupils      Dark Light Shape React APD   Right 4 3 Irregular Minimal None   Left 4 3 Round Brisk None       Visual Fields  (Counting fingers)      Left Right    Full Full       Extraocular Movement      Right Left    Full, Ortho Full, Ortho       Neuro/Psych    Oriented x3:  Yes   Mood/Affect:  Normal       Dilation    Both eyes:  1.0% Mydriacyl, 2.5% Phenylephrine @ 9:56 AM        Slit Lamp and Fundus Exam    External Exam      Right Left   External Normal Normal       Slit Lamp Exam      Right Left   Lids/Lashes Dermatochalasis - upper lid temporally , Telangiectasia Dermatochalasis - upper lid temporally , Telangiectasia   Conjunctiva/Sclera White and quiet White and quiet   Cornea Nylon mattress suture at 0900; cataract wound closed at 0900, NV at 0900 wound Well healed cataract wounds   Anterior Chamber Deep and quiet Deep and quiet   Iris Round and dilated; 5.5 mm; TID at temporal pupil margin Round and dilated; 7.5 mm; PPM at 0600    Lens Three piece Posterior chamber intraocular lens ?sulcus Posterior chamber intraocular lens - good position   Vitreous Vitreous syneresis Vitreous syneresis; Posterior vitreous detachment       Fundus Exam      Right Left   Disc Normal rim; cupped, inf and temp PPP Normal rim, cupped, temp PPP   C/D Ratio 0.7 0.65   Macula Blunted foveal reflex, Epiretinal membrane, +CME  foveal reflex, Blunted foveal reflex, Retinal pigment epithelial mottling, No heme or edema   Vessels Vascular attenuation - mild Vascular attenuation - mild   Periphery flat; attached; trace RPE changes; hair line Retinal tears at 3 and 4 o'clock w/ good laser scars surrounding; peripheral cystoid degeneration; supratemporal cobblestoning  Flat; attached; trace RPE changes, peripheral cystoid degneration        Refraction    Wearing Rx      Sphere Cylinder Axis Add   Right -1.25 +0.75 088 +2.50   Left -1.25 +0.75 110 +2.50   Age:  6 mo   Type:  PAL       Manifest Refraction      Sphere Cylinder Axis Dist VA   Right -1.00 +1.00 088 20/30+2   Left -1.25 +0.75 110 20/20-1           IMAGING AND PROCEDURES  Imaging and Procedures for 04/30/17  OCT, Retina - OU - Both Eyes       Right Eye Quality was good. Central Foveal Thickness: 574. Progression has worsened. Findings include abnormal foveal contour, epiretinal membrane, intraretinal fluid, subretinal fluid, cystoid macular edema.   Left Eye Quality was good. Central  Foveal Thickness: 78. Progression has been stable. Findings include normal foveal contour, no IRF, no SRF.   Notes Images taken, stored on drive   Diagnosis / Impression:  OD - ERM, recurrent CME with +IRF/SRF OS - NFP; no IRF/SRF  Clinical management:  See below  Abbreviations: NFP - Normal foveal profile. CME - cystoid macular edema. PED - pigment epithelial detachment. IRF - intraretinal fluid. SRF - subretinal fluid. EZ - ellipsoid zone. ERM - epiretinal membrane. ORA - outer retinal atrophy. ORT - outer retinal tubulation. SRHM - subretinal hyper-reflective material         Fluorescein Angiography Optos (Transit OD)       Right Eye   Progression has no prior data. Early phase findings include staining, leakage. Mid/Late phase findings include staining, leakage.   Left Eye   Progression has no prior data. Early phase findings include normal observations. Mid/Late phase findings include normal observations.   Notes Images captured and stored on drive;   Impression: OD: petaloid hyper fluorescence and late hyper fluorescence of disc consistent with CME OS: normal study                  ASSESSMENT/PLAN:    ICD-10-CM   1. 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 Posterior vitreous detachment of both eyes H43.813   3. Type 2 diabetes mellitus without complication, without long-term current use of insulin (HCC) E11.9   4. Pseudophakia of both eyes Z96.1   5. CME (cystoid macular edema), right H35.351 OCT, Retina - OU - Both Eyes    Fluorescein Angiography Optos (Transit OD)  6.  Epiretinal membrane (ERM) of right eye H35.371 OCT, Retina - OU - Both Eyes    1. Peripheral retinal tears OD  Small flap tears at ora, 0300 and 0400 oclock  s/p barrier laser retinopexy OD, 03/18/17 -- good laser in place  Stable; No new RT/RD  2. PVD / vitreous syneresis OU  Discussed findings and prognosis  No RT or RD on 360 scleral depressed exam OS  Reviewed s/s of RT/RD  Strict return precautions for any such RT/RD signs/symptoms  3. DM2 without retinopathy  On oral meds  Discussed importance of tight BG and BP control  4. Pseudophakia OU  IOLs in good position OU -- beautiful surgeries by Dr. Bing Plume  OD with history of anterior vitrectomy and 3 piece sulcus IOL  Monitor  5. CME OD  Recurrent today (10.14.19)  Etiology could be multifactorial -- ERM, delayed post-cataract  Start PF and Ketorolac QID OD  F/U 4 weeks  6. Epiretinal membrane, right eye   The natural history, anatomy, potential for loss of vision, and treatment options including vitrectomy techniques and the complications of endophthalmitis, retinal detachment, vitreous hemorrhage and permanent vision loss discussed with the patient  Mild ERM OD  May be contributing to CME OD (#5)  Not yet surgical -- monitor for pucker / symptoms  F/u in 1 year     Ophthalmic Meds Ordered this visit:  Meds ordered this encounter  Medications  . prednisoLONE acetate (PRED FORTE) 1 % ophthalmic suspension    Sig: Place 1 drop into the right eye 4 (four) times daily.    Dispense:  10 mL    Refill:  0  . ketorolac (ACULAR) 0.5 % ophthalmic solution    Sig: Place 1 drop into the right eye 4 (four) times daily.    Dispense:  10 mL    Refill:  0  Return in about 4 weeks (around 05/24/2018) for F/U CME OD, DFE, OCT.  There are no Patient Instructions on file for this visit.   Explained the diagnoses, plan, and follow up with the patient and they expressed understanding.  Patient expressed understanding of the  importance of proper follow up care.   This document serves as a record of services personally performed by Gardiner Sleeper, MD, PhD. It was created on their behalf by Catha Brow, [place], a certified ophthalmic assistant. The creation of this record is the provider's dictation and/or activities during the visit.  Electronically signed by: Catha Brow, COA  10.09.19 5:15 PM   Gardiner Sleeper, M.D., Ph.D. Diseases & Surgery of the Retina and Vitreous [facility]   I have reviewed the above documentation for accuracy and completeness, and I agree with the above. Gardiner Sleeper, M.D., Ph.D. 04/27/18 5:15 PM    Abbreviations: M myopia (nearsighted); A astigmatism; H hyperopia (farsighted); P presbyopia; Mrx spectacle prescription;  CTL contact lenses; OD right eye; OS left eye; OU both eyes  XT exotropia; ET esotropia; PEK punctate epithelial keratitis; PEE punctate epithelial erosions; DES dry eye syndrome; MGD meibomian gland dysfunction; ATs artificial tears; PFAT's preservative free artificial tears; [place] nuclear sclerotic cataract; PSC posterior subcapsular cataract; ERM epi-retinal membrane; PVD posterior vitreous detachment; RD retinal detachment; DM diabetes mellitus; DR diabetic retinopathy; NPDR non-proliferative diabetic retinopathy; PDR proliferative diabetic retinopathy; CSME clinically significant macular edema; DME diabetic macular edema; dbh dot blot hemorrhages; CWS cotton wool spot; POAG primary open angle glaucoma; C/D cup-to-disc ratio; HVF humphrey visual field; GVF goldmann visual field; OCT optical coherence tomography; IOP intraocular pressure; BRVO Branch retinal vein occlusion; CRVO central retinal vein occlusion; CRAO central retinal artery occlusion; BRAO branch retinal artery occlusion; RT retinal tear; SB scleral buckle; PPV pars plana vitrectomy; VH Vitreous hemorrhage; PRP panretinal laser photocoagulation; IVK intravitreal kenalog; VMT  vitreomacular traction; MH Macular hole;  NVD neovascularization of the disc; NVE neovascularization elsewhere; AREDS age related eye disease study; ARMD age related macular degeneration; POAG primary open angle glaucoma; EBMD epithelial/anterior basement membrane dystrophy; ACIOL anterior chamber intraocular lens; IOL intraocular lens; PCIOL posterior chamber intraocular lens; Phaco/IOL phacoemulsification with intraocular lens placement; [place] photorefractive keratectomy; LASIK laser assisted in situ keratomileusis; HTN hypertension; DM diabetes mellitus; COPD chronic obstructive pulmonary disease

## 2018-04-26 ENCOUNTER — Ambulatory Visit (INDEPENDENT_AMBULATORY_CARE_PROVIDER_SITE_OTHER): Payer: Medicare HMO | Admitting: Ophthalmology

## 2018-04-26 ENCOUNTER — Encounter (INDEPENDENT_AMBULATORY_CARE_PROVIDER_SITE_OTHER): Payer: Self-pay | Admitting: Ophthalmology

## 2018-04-26 DIAGNOSIS — H33331 Multiple defects of retina without detachment, right eye: Secondary | ICD-10-CM

## 2018-04-26 DIAGNOSIS — H35371 Puckering of macula, right eye: Secondary | ICD-10-CM | POA: Diagnosis not present

## 2018-04-26 DIAGNOSIS — H43813 Vitreous degeneration, bilateral: Secondary | ICD-10-CM | POA: Diagnosis not present

## 2018-04-26 DIAGNOSIS — H35351 Cystoid macular degeneration, right eye: Secondary | ICD-10-CM

## 2018-04-26 DIAGNOSIS — Z961 Presence of intraocular lens: Secondary | ICD-10-CM | POA: Diagnosis not present

## 2018-04-26 DIAGNOSIS — E119 Type 2 diabetes mellitus without complications: Secondary | ICD-10-CM | POA: Diagnosis not present

## 2018-04-26 MED ORDER — PREDNISOLONE ACETATE 1 % OP SUSP: 1.0000 [drp] | Freq: Four times a day (QID) | OPHTHALMIC | 0 refills | Status: DC

## 2018-04-26 MED ORDER — KETOROLAC TROMETHAMINE 0.5 % OP SOLN: 1.0000 [drp] | Freq: Four times a day (QID) | OPHTHALMIC | 0 refills | Status: DC

## 2018-04-26 MED FILL — PREDNISOLONE AC 1% EYE DROP: 1 | 50 days supply | Qty: 10 | Fill #0

## 2018-04-26 MED FILL — KETOROLAC 0.5% OPHTH SOLN: 0.5 | 25 days supply | Qty: 5 | Fill #0

## 2018-04-27 ENCOUNTER — Encounter (INDEPENDENT_AMBULATORY_CARE_PROVIDER_SITE_OTHER): Payer: Self-pay | Admitting: Ophthalmology

## 2018-04-27 MED FILL — JANUVIA 100 MG TABLET: 100 | 30 days supply | Qty: 30 | Fill #2

## 2018-04-27 MED FILL — DICYCLOMINE 10 MG CAPSULE: 10 | 10 days supply | Qty: 60 | Fill #1

## 2018-04-29 ENCOUNTER — Encounter: Payer: Self-pay | Admitting: Family Medicine

## 2018-04-29 ENCOUNTER — Ambulatory Visit (INDEPENDENT_AMBULATORY_CARE_PROVIDER_SITE_OTHER): Payer: Worker's Compensation | Admitting: Family Medicine

## 2018-04-29 VITALS — BP 116/79 | HR 80 | Ht 63.0 in | Wt 158.0 lb

## 2018-04-29 DIAGNOSIS — S99911D Unspecified injury of right ankle, subsequent encounter: Secondary | ICD-10-CM

## 2018-04-29 NOTE — Patient Instructions (Signed)
Start motion exercises of your ankle now - up/downs and alphabet exercises twice a day. Wear supportive shoes when up and walking around. Compression ankle sleeve also when up and walking around (bodyhelix is one brand). Start theraband strengthening exercises in about 1 week with yellow theraband. Aleve if needed. Tentatively return to work 4 hour shifts in 1 week, full duty in 2 weeks. Follow up with me in 3-4 weeks for reevaluation.

## 2018-04-29 NOTE — Progress Notes (Signed)
PCP: [name], MD  Subjective:   HPI: Patient is a 67 y.o. female here for right foot/ankle injury.  9/10: Patient reports while at work 9/5 she accidentally fell, inverted her right ankle badly and struck middle of low back on counter as she fell down. Pain level in her splint is now 0/10 but was sharp. Unable to bear weight initially. Currently in wheelchair. + swelling and bruising. No prior injuries. Intermittently had numbness.  9/17: Patient reports she's doing well. Pain currently at 0/10 level. Wearing boot regularly. Using knee scooter though she has put a little weight on her foot. Taking aleve some. Is elevating but no needed the norco or icing. Pain is an achiness when this comes on. No numbness.  10/1: Patient returns reporting she's much better. Pain level 0/10. Wearing cam walker and using knee scooter but reports she's able to bear weight without pain in short bursts. Not taking anything for pain. No numbness.  10/17: Patient reports she's doing well. Pain still an achiness, worse when on feet for prolonged period. Swelling with some stiffness. Pain currently 0/10. Out of boot for past 3-4 days now. No skin changes.  Past Medical History:  Diagnosis Date  . Acute peptic ulcer of stomach    As a teenager  . Allergy    dust, cock roach,grass,weeds,molds  . Anxiety   . Arthritis    knees, hands, shoulders  . Asthma    02/08/10 FEV1 1.98 (80%), s/p saba 2.22 l/m (90%).nml  . Broken ankle    right foot, wearing a boot  . Cognitive change 06/10/2016  . Dementia ([place])   . Depression   . Diabetes mellitus 2003   Type II  . Diverticulosis   . Dyspepsia   . Fatty liver 12/19/2010  . GERD (gastroesophageal reflux disease)   . Hearing loss    bilateral hearing aids   . Herpes simplex virus (HSV) infection   . High cholesterol   . Hyperlipemia   . Hypertension   . Hypothyroidism 09/08/2013  . IBS (irritable bowel syndrome)   . Insomnia   .  Internal hemorrhoids   . Joint pain 08/08/2016  . Low back pain 08/10/2016  . OSA (obstructive sleep apnea)    Does not use CPAP - old one broken, new sleep study to be scheduled and then given a new CPAP machine  . Plantar fasciitis    no longer an issue  . Sun-damaged skin 12/09/2016  . SVD (spontaneous vaginal delivery)    x 4  . Vaginal Pap smear, abnormal   . Vitamin D deficiency 03/04/2016  . Wears partial dentures    upper dentures and lower partial    Current Outpatient Medications on File Prior to Visit  Medication Sig Dispense Refill  . ACCU-CHEK SOFTCLIX LANCETS lancets Use as directed twice daily to check blood sugar. DX E11.9 200 each 3  . albuterol (VENTOLIN HFA) 108 (90 Base) MCG/ACT inhaler INHALE 2 PUFFS BY MOUTH INTO THE LUNGS EVERY 4 (FOUR) HOURS AS NEEDED FOR SHORTNESS OF BREATH AND WHEEZING 18 g 1  . Alcohol Swabs (B-D SINGLE USE SWABS REGULAR) PADS Use as directed to check blood sugar. DX E11.9 100 each 3  . atenolol (TENORMIN) 25 MG tablet Take 1 tablet (25 mg total) by mouth daily. 90 tablet 3  . atorvastatin (LIPITOR) 80 MG tablet TAKE 1 TABLET EVERY DAY (Patient taking differently: Take 80 mg by mouth at bedtime. ) 90 tablet 2  . Blood Glucose  Calibration (ACCU-CHEK AVIVA) SOLN Use as directed to check blood sugar.  DX E11.9 1 each 3  . Blood Glucose Monitoring Suppl (ACCU-CHEK AVIVA PLUS) w/Device KIT USE AS DIRECTED TO CHECK BLOOD SUGAR. 1 kit 0  . cetirizine (ZYRTEC) 10 MG tablet Take 1 tablet (10 mg total) by mouth daily as needed for allergies. 30 tablet 11  . Cholecalciferol (VITAMIN D3) 2000 units TABS Take 1 tablet by mouth daily.    Marland Kitchen dicyclomine (BENTYL) 10 MG capsule TAKE 1 CAPSULE EVERY 4-6 HOURS AS NEEDED FOR ABDOMINAL PAIN AND CRAMPING OR DIARRHEA (Patient taking differently: Take 10 mg by mouth See admin instructions. TAKE 1 CAPSULE EVERY 4-6 HOURS AS NEEDED FOR ABDOMINAL PAIN AND CRAMPING OR DIARRHEA) 60 capsule 1  . donepezil (ARICEPT) 10 MG tablet  Take 1 tablet (10 mg total) by mouth at bedtime. 30 tablet 0  . escitalopram (LEXAPRO) 20 MG tablet Take 1 tablet (20 mg total) by mouth daily. 30 tablet 0  . famciclovir (FAMVIR) 500 MG tablet 1000 mg po bid x 24 hours then 250 mg po bid every day (Patient taking differently: Take 250 mg by mouth 2 (two) times daily. ) 35 tablet 3  . fenofibrate 160 MG tablet TAKE 1 TABLET (160 MG TOTAL) BY MOUTH DAILY. 90 tablet 2  . fluticasone (FLONASE) 50 MCG/ACT nasal spray Place 2 sprays into both nostrils daily. 16 g 3  . glimepiride (AMARYL) 4 MG tablet Take 1 tablet (4 mg total) by mouth 2 (two) times daily. (Patient taking differently: Take 4 mg by mouth daily with breakfast. ) 180 tablet 1  . glucose blood (ACCU-CHEK AVIVA PLUS) test strip Use as directed twice daily to check blood sugar.  DX E11.9 200 each 3  . HYDROcodone-acetaminophen (NORCO/VICODIN) 5-325 MG tablet Take 1 tablet by mouth every 4 (four) hours as needed. (Patient taking differently: Take 1 tablet by mouth every 4 (four) hours as needed for moderate pain. ) 30 tablet 0  . ibuprofen (ADVIL,MOTRIN) 600 MG tablet Take 1 tablet (600 mg total) by mouth every 6 (six) hours as needed. 30 tablet 1  . ketorolac (ACULAR) 0.5 % ophthalmic solution Place 1 drop into the right eye 4 (four) times daily. 10 mL 0  . levothyroxine (SYNTHROID, LEVOTHROID) 25 MCG tablet TAKE 1 TABLET (25 MCG TOTAL) BY MOUTH DAILY BEFORE BREAKFAST. 90 tablet 2  . losartan (COZAAR) 50 MG tablet TAKE 1 TABLET EVERY DAY (Patient taking differently: Take 50 mg by mouth daily. ) 90 tablet 1  . montelukast (SINGULAIR) 10 MG tablet Take 1 tablet (10 mg total) by mouth at bedtime as needed (allergies). 30 tablet 3  . Multiple Vitamins-Minerals (CENTRUM SILVER PO) Take 1 tablet by mouth daily.      . ondansetron (ZOFRAN ODT) 4 MG disintegrating tablet Take one tab by mouth Q6hr prn nausea.  Dissolve under tongue. (Patient taking differently: Take 4 mg by mouth every 6 (six) hours as  needed for nausea. Take one tab by mouth Q6hr prn nausea.  Dissolve under tongue.) 12 tablet 0  . oxyCODONE-acetaminophen (PERCOCET/ROXICET) 5-325 MG tablet Take 1 tablet by mouth every 6 (six) hours as needed. (Patient not taking: Reported on 04/26/2018) 8 tablet 0  . oxyCODONE-acetaminophen (PERCOCET/ROXICET) 5-325 MG tablet Take 1 tablet by mouth every 6 (six) hours as needed. (Patient not taking: Reported on 04/26/2018) 6 tablet 0  . pantoprazole (PROTONIX) 40 MG tablet TAKE 1 TABLET EVERY DAY (Patient taking differently: Take 40 mg by mouth daily. )  90 tablet 0  . prednisoLONE acetate (PRED FORTE) 1 % ophthalmic suspension Place 1 drop into the right eye 4 (four) times daily. 10 mL 0  . Probiotic Product (ALIGN) 4 MG CAPS Take 1 capsule by mouth daily. Reported on 07/02/2015    . ramelteon (ROZEREM) 8 MG tablet Take 1 tablet (8 mg total) by mouth at bedtime. 90 tablet 1  . sitaGLIPtin (JANUVIA) 100 MG tablet Take 1 tablet (100 mg total) by mouth daily. 90 tablet 1  . triamcinolone cream (KENALOG) 0.1 % Apply 1 application topically 2 (two) times daily.   0   No current facility-administered medications on file prior to visit.     Past Surgical History:  Procedure Laterality Date  . APPENDECTOMY     age 65  . CATARACT EXTRACTION Bilateral 2016   Dr. Bing Plume  . CATARACT EXTRACTION, BILATERAL     12/8 and 12/14  . COLONOSCOPY  06/03/2010, 08/25/2011    severeal - polyps and diverticulosis   . DILATATION & CURETTAGE/HYSTEROSCOPY WITH MYOSURE N/A 04/01/2018   Procedure: DILATATION & CURETTAGE/HYSTEROSCOPY WITH MYOSURE POLYPECTOMY;  Surgeon: Emily Filbert, MD;  Location: [facility];  Service: Gynecology;  Laterality: N/A;  . EYE SURGERY     cateracts removed bilateral  . GASTRECTOMY     partial - ulcer as a teen  . HAND SURGERY Right    thumb   . HEMORRHOID BANDING    . KNEE ARTHROSCOPY Left 1998  . NASAL SEPTUM SURGERY    . RHINOPLASTY    . TOE SURGERY Right 1995    Allergies  Allergen  Reactions  . Compazine [Prochlorperazine Edisylate]     Comma for 3 days  . Sulfa Antibiotics Itching    severe itching  . Tetanus Toxoid Other (See Comments)    convulsions    Social History   Socioeconomic History  . Marital status: Widowed    Spouse name: Not on file  . Number of children: 4  . Years of education: Not on file  . Highest education level: Not on file  Occupational History  . Occupation: retired    Fish farm manager: [employer]  Social Needs  . Financial resource strain: Not on file  . Food insecurity:    Worry: Not on file    Inability: Not on file  . Transportation needs:    Medical: Not on file    Non-medical: Not on file  Tobacco Use  . Smoking status: Never Smoker  . Smokeless tobacco: Never Used  Substance and Sexual Activity  . Alcohol use: Yes    Alcohol/week: 0.0 standard drinks    Comment: Rare occasions   . Drug use: No  . Sexual activity: Not Currently    Birth control/protection: Post-menopausal  Lifestyle  . Physical activity:    Days per week: Not on file    Minutes per session: Not on file  . Stress: Not on file  Relationships  . Social connections:    Talks on phone: Not on file    Gets together: Not on file    Attends religious service: Not on file    Active member of club or organization: Not on file    Attends meetings of clubs or organizations: Not on file    Relationship status: Not on file  . Intimate partner violence:    Fear of current or ex partner: Not on file    Emotionally abused: Not on file    Physically abused: Not on file  Forced sexual activity: Not on file  Other Topics Concern  . Not on file  Social History Narrative   No caffeine drinks daily     Family History  Problem Relation Age of Onset  . Diabetes Mother        type II  . Heart attack Mother 108  . Stroke Mother   . Hyperlipidemia Mother   . Colon cancer Sister   . Cancer Sister        GI  . Heart disease Sister   . Asthma Maternal Grandmother   .  Mental illness Son        PTSD from [employer]  . Parkinsonism Father   . Cancer Sister        stage 4 bladder cancer  . Colon polyps Sister   . Ulcers Unknown        bleeding gastric  . Heart defect Unknown        child  . Other Unknown        perforated bowel, child  . Cancer Brother        pancreatic cancer  . Amblyopia Neg Hx   . Blindness Neg Hx   . Cataracts Neg Hx   . Glaucoma Neg Hx   . Macular degeneration Neg Hx   . Retinal detachment Neg Hx   . Strabismus Neg Hx   . Retinitis pigmentosa Neg Hx     BP 116/79   Pulse 80   Ht _0  (1.6 m)   Wt 158 lb (71.7 kg)   LMP  (LMP Unknown)   BMI 27.99 kg/m   Review of Systems: See HPI above.     Objective:  Physical Exam:  Gen: NAD, comfortable in exam room  Right foot/ankle: Mild lateral swelling of ankle into dorsal foot.  No gross deformity, ecchymoses. Mild limitation and pain with pain with IR and ER. TTP laterally over lateral talus, lateral ankle joint.  No other tenderness. Negative ant drawer and talar tilt.   Negative syndesmotic compression. Thompsons test negative. NV intact distally.  Assessment & Plan:  1. Right ankle injury - 2/2 lateral process fracture of talus.  Some limitation of motion, still with pain and stiffness from immobilization.  Start motion exercises and strengthening.  Return to 4 hour shifts in 1 week then back to full shifts in 2 weeks.  F/u in 3-4 weeks for reevaluation.

## 2018-05-04 DIAGNOSIS — Z961 Presence of intraocular lens: Secondary | ICD-10-CM | POA: Diagnosis not present

## 2018-05-04 DIAGNOSIS — E119 Type 2 diabetes mellitus without complications: Secondary | ICD-10-CM | POA: Diagnosis not present

## 2018-05-04 DIAGNOSIS — H35033 Hypertensive retinopathy, bilateral: Secondary | ICD-10-CM | POA: Diagnosis not present

## 2018-05-04 DIAGNOSIS — H43813 Vitreous degeneration, bilateral: Secondary | ICD-10-CM | POA: Diagnosis not present

## 2018-05-04 DIAGNOSIS — H35351 Cystoid macular degeneration, right eye: Secondary | ICD-10-CM | POA: Diagnosis not present

## 2018-05-10 MED FILL — TRIAMCINOLONE 0.1% CREAM: 0.1 | 15 days supply | Qty: 30 | Fill #0

## 2018-05-13 DIAGNOSIS — F331 Major depressive disorder, recurrent, moderate: Secondary | ICD-10-CM | POA: Diagnosis not present

## 2018-05-13 DIAGNOSIS — F5101 Primary insomnia: Secondary | ICD-10-CM | POA: Diagnosis not present

## 2018-05-13 DIAGNOSIS — F604 Histrionic personality disorder: Secondary | ICD-10-CM | POA: Diagnosis not present

## 2018-05-13 MED FILL — MIRTAZAPINE 15 MG TABLET: 15 | 90 days supply | Qty: 45 | Fill #0

## 2018-05-17 ENCOUNTER — Emergency Department (HOSPITAL_BASED_OUTPATIENT_CLINIC_OR_DEPARTMENT_OTHER)
Admission: EM | Admit: 2018-05-17 | Discharge: 2018-05-17 | Disposition: A | Discharge status: Home / Self Care | Payer: Medicare HMO | Attending: Emergency Medicine | Admitting: Emergency Medicine

## 2018-05-17 ENCOUNTER — Emergency Department (HOSPITAL_BASED_OUTPATIENT_CLINIC_OR_DEPARTMENT_OTHER): Payer: Medicare HMO

## 2018-05-17 ENCOUNTER — Encounter (HOSPITAL_BASED_OUTPATIENT_CLINIC_OR_DEPARTMENT_OTHER): Payer: Self-pay | Admitting: Respiratory Therapy

## 2018-05-17 DIAGNOSIS — I209 Angina pectoris, unspecified: Secondary | ICD-10-CM | POA: Diagnosis not present

## 2018-05-17 DIAGNOSIS — E119 Type 2 diabetes mellitus without complications: Secondary | ICD-10-CM | POA: Insufficient documentation

## 2018-05-17 DIAGNOSIS — J45909 Unspecified asthma, uncomplicated: Secondary | ICD-10-CM | POA: Diagnosis not present

## 2018-05-17 DIAGNOSIS — F0390 Unspecified dementia without behavioral disturbance: Secondary | ICD-10-CM | POA: Insufficient documentation

## 2018-05-17 DIAGNOSIS — R079 Chest pain, unspecified: Secondary | ICD-10-CM | POA: Diagnosis not present

## 2018-05-17 DIAGNOSIS — F329 Major depressive disorder, single episode, unspecified: Secondary | ICD-10-CM | POA: Diagnosis not present

## 2018-05-17 DIAGNOSIS — I10 Essential (primary) hypertension: Secondary | ICD-10-CM | POA: Diagnosis not present

## 2018-05-17 DIAGNOSIS — Z79899 Other long term (current) drug therapy: Secondary | ICD-10-CM | POA: Diagnosis not present

## 2018-05-17 DIAGNOSIS — R0602 Shortness of breath: Secondary | ICD-10-CM | POA: Diagnosis not present

## 2018-05-17 DIAGNOSIS — E039 Hypothyroidism, unspecified: Secondary | ICD-10-CM | POA: Diagnosis not present

## 2018-05-17 DIAGNOSIS — Z7984 Long term (current) use of oral hypoglycemic drugs: Secondary | ICD-10-CM | POA: Insufficient documentation

## 2018-05-17 DIAGNOSIS — F419 Anxiety disorder, unspecified: Secondary | ICD-10-CM | POA: Diagnosis not present

## 2018-05-17 LAB — TROPONIN I: Troponin I: <0.03 ng/mL (ref <0.03)

## 2018-05-17 LAB — CBC
HCT: 36.9 % (ref 36.0–46.0)
Hemoglobin: 12 g/dL (ref 12.0–15.0)
MCH: 30.5 pg (ref 26.0–34.0)
MCHC: 32.5 g/dL (ref 30.0–36.0)
MCV: 93.7 fL (ref 80.0–100.0)
PLATELETS: 238 10*3/uL (ref 150–400)
RBC: 3.94 MIL/uL (ref 3.87–5.11)
RDW: 12.6 % (ref 11.5–15.5)
WBC: 7.6 10*3/uL (ref 4.0–10.5)
nRBC: 0 % (ref 0.0–0.2)

## 2018-05-17 LAB — BASIC METABOLIC PANEL
ANION GAP: 12 (ref 5–15)
BUN: 17 mg/dL (ref 8–23)
CALCIUM: 8.8 mg/dL — AB (ref 8.9–10.3)
CO2: 22 mmol/L (ref 22–32)
Chloride: 107 mmol/L (ref 98–111)
Creatinine, Ser: 0.76 mg/dL (ref 0.44–1.00)
GLUCOSE: 169 mg/dL — AB (ref 70–99)
Potassium: 3.5 mmol/L (ref 3.5–5.1)
Sodium: 141 mmol/L (ref 135–145)

## 2018-05-17 MED ORDER — KETOROLAC TROMETHAMINE 15 MG/ML IJ SOLN
15.0000 mg | Freq: Once | INTRAMUSCULAR | Status: AC
  Administered 2018-05-17: 15 mg via INTRAVENOUS
  Filled 2018-05-17: qty 1

## 2018-05-17 MED ORDER — DIAZEPAM 2 MG PO TABS
2.0000 mg | ORAL_TABLET | Freq: Once | ORAL | Status: AC
  Administered 2018-05-17: 2 mg via ORAL
  Filled 2018-05-17: qty 1

## 2018-05-17 NOTE — ED Provider Notes (Signed)
[facility] EMERGENCY DEPARTMENT Provider Note   CSN: [id number] Arrival date & time: 05/17/18  1030     History   Chief Complaint Chief Complaint  Patient presents with  . Chest Pain    HPI Crystal [name] is a 67 y.o. female.  The history is provided by the patient and medical records. No language interpreter was used.  Chest Pain   Associated symptoms include shortness of breath. Pertinent negatives include no cough and no palpitations.   Crystal [name] is a 67 y.o. female  with a PMH of HTN, HLD, DM2 who presents to the Emergency Department complaining of left-sided chest pain that radiates to left side and back.  She awoke at 3 AM this morning due to her pain.  She took 3 ibuprofen and then went back to sleep.  She feels as if the ibuprofen helped, as she was able to go back to sleep.  The pain has been constant, however will wax and wane in intensity.  She does report associated shortness of breath and notes that taking deep breaths does make her pain much worse.  No fever, chills, cough, congestion, nausea, vomiting, abdominal pain, leg swelling, diaphoresis.  She did have surgery on her ankle about 2 months ago and reports being somewhat immobile since procedure.   Past Medical History:  Diagnosis Date  . Acute peptic ulcer of stomach    As a teenager  . Allergy    dust, cock roach,grass,weeds,molds  . Anxiety   . Arthritis    knees, hands, shoulders  . Asthma    02/08/10 FEV1 1.98 (80%), s/p saba 2.22 l/m (90%).nml  . Broken ankle    right foot, wearing a boot  . Cognitive change 06/10/2016  . Dementia ([place])   . Depression   . Diabetes mellitus 2003   Type II  . Diverticulosis   . Dyspepsia   . Fatty liver 12/19/2010  . GERD (gastroesophageal reflux disease)   . Hearing loss    bilateral hearing aids   . Herpes simplex virus (HSV) infection   . High cholesterol   . Hyperlipemia   . Hypertension   . Hypothyroidism 09/08/2013  . IBS  (irritable bowel syndrome)   . Insomnia   . Internal hemorrhoids   . Joint pain 08/08/2016  . Low back pain 08/10/2016  . OSA (obstructive sleep apnea)    Does not use CPAP - old one broken, new sleep study to be scheduled and then given a new CPAP machine  . Plantar fasciitis    no longer an issue  . Sun-damaged skin 12/09/2016  . SVD (spontaneous vaginal delivery)    x 4  . Vaginal Pap smear, abnormal   . Vitamin D deficiency 03/04/2016  . Wears partial dentures    upper dentures and lower partial    Patient Active Problem List   Diagnosis Date Noted  . Postmenopausal bleeding 03/20/2018  . Burn 08/07/2017  . Tremor 06/26/2017  . Left knee pain 06/18/2017  . Right knee pain 12/11/2016  . 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 Sun-damaged skin 12/09/2016  . 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 Mild cognitive impairment 08/21/2016  . Mild single current episode of major depressive disorder ([place]) 08/21/2016  . Low back pain 08/10/2016  . Joint pain 08/08/2016  . Prolapsed internal hemorrhoids, grade 2 06/18/2016  . Cognitive change 06/10/2016  . Family history of colon cancer 05/19/2016  . Vitamin D deficiency 03/04/2016  .  Deformity of metatarsal bone of right foot 08/15/2015  . Metatarsalgia of right foot 08/15/2015  . Otalgia 04/02/2015  . Preventative health care 06/25/2014  . Sinusitis 12/09/2013  . Osteopenia 09/19/2013  . Hypothyroidism 09/08/2013  . Osteoarthritis, knee 09/06/2013  . Skin lesion 09/01/2013  . Dysphagia, unspecified(787.20) 07/19/2013  . Allergic rhinitis 11/16/2012  . Osteoarthritis of both hands 10/15/2012  . Peripheral neuropathy 12/19/2011  . 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 GERD (gastroesophageal reflux disease) 03/07/2011  . Fatty liver 12/19/2010  . Genital herpes 08/06/2010  . MAMMOGRAM, ABNORMAL 08/02/2010  . ABNORMAL ELECTROCARDIOGRAM 07/19/2010  . Obstructive sleep apnea 06/18/2010  .  DM (diabetes mellitus), type 2 with neurological complications ([place]) 54/00/8676  . Hyperlipidemia 04/12/2010  . Essential hypertension 04/12/2010  . Asthma 04/12/2010  . DEPRESSION/ANXIETY 03/13/2010    Past Surgical History:  Procedure Laterality Date  . APPENDECTOMY     age 6  . CATARACT EXTRACTION Bilateral 2016   Dr. Bing Plume  . CATARACT EXTRACTION, BILATERAL     12/8 and 12/14  . COLONOSCOPY  06/03/2010, 08/25/2011    severeal - polyps and diverticulosis   . DILATATION & CURETTAGE/HYSTEROSCOPY WITH MYOSURE N/A 04/01/2018   Procedure: DILATATION & CURETTAGE/HYSTEROSCOPY WITH MYOSURE POLYPECTOMY;  Surgeon: Emily Filbert, MD;  Location: [facility];  Service: Gynecology;  Laterality: N/A;  . EYE SURGERY     cateracts removed bilateral  . GASTRECTOMY     partial - ulcer as a teen  . HAND SURGERY Right    thumb   . HEMORRHOID BANDING    . KNEE ARTHROSCOPY Left 1998  . NASAL SEPTUM SURGERY    . RHINOPLASTY    . TOE SURGERY Right 1995     OB History    Gravida  4   Para  4   Term      Preterm      AB      Living  3     SAB      TAB      Ectopic      Multiple      Live Births           Obstetric Comments  1 child died at 28 weeks (heart defects, perforated bowel)         Home Medications    Prior to Admission medications   Medication Sig Start Date End Date Taking? Authorizing Provider  ACCU-CHEK SOFTCLIX LANCETS lancets Use as directed twice daily to check blood sugar. DX E11.9 07/15/17   [name], MD  albuterol (VENTOLIN HFA) 108 (90 Base) MCG/ACT inhaler INHALE 2 PUFFS BY MOUTH INTO THE LUNGS EVERY 4 (FOUR) HOURS AS NEEDED FOR SHORTNESS OF BREATH AND WHEEZING 02/23/18   [name], MD  Alcohol Swabs (B-D SINGLE USE SWABS REGULAR) PADS Use as directed to check blood sugar. DX E11.9 07/15/17   [name], MD  atenolol (TENORMIN) 25 MG tablet Take 1 tablet (25 mg total) by mouth daily. 06/26/17   [name], MD  atorvastatin (LIPITOR) 80 MG  tablet TAKE 1 TABLET EVERY DAY Patient taking differently: Take 80 mg by mouth at bedtime.  04/06/17   [name], MD  Blood Glucose Calibration (ACCU-CHEK AVIVA) SOLN Use as directed to check blood sugar.  DX E11.9 07/15/17   [name], MD  Blood Glucose Monitoring Suppl (ACCU-CHEK AVIVA PLUS) w/Device KIT USE AS DIRECTED TO CHECK BLOOD SUGAR. 07/27/17   [name], MD  cetirizine (ZYRTEC) 10 MG tablet Take 1 tablet (10 mg total) by mouth daily as needed for allergies. 05/07/17   [name], MD  Cholecalciferol (VITAMIN D3) 2000 units TABS Take 1 tablet by mouth daily.    [provider]  dicyclomine (BENTYL) 10 MG capsule TAKE 1 CAPSULE EVERY 4-6 HOURS AS NEEDED FOR ABDOMINAL PAIN AND CRAMPING OR DIARRHEA Patient taking differently: Take 10 mg by mouth See admin instructions. TAKE 1 CAPSULE EVERY 4-6 HOURS AS NEEDED FOR ABDOMINAL PAIN AND CRAMPING OR DIARRHEA 01/19/18   Levin Erp, PA  donepezil (ARICEPT) 10 MG tablet Take 1 tablet (10 mg total) by mouth at bedtime. 02/24/18 02/24/19  Aundra Dubin, MD  escitalopram (LEXAPRO) 20 MG tablet Take 1 tablet (20 mg total) by mouth daily. 02/24/18   Aundra Dubin, MD  famciclovir (FAMVIR) 500 MG tablet 1000 mg po bid x 24 hours then 250 mg po bid every day Patient taking differently: Take 250 mg by mouth 2 (two) times daily.  12/09/16   [name], MD  fenofibrate 160 MG tablet TAKE 1 TABLET (160 MG TOTAL) BY MOUTH DAILY. 10/02/17   [name], MD  fluticasone (FLONASE) 50 MCG/ACT nasal spray Place 2 sprays into both nostrils daily. 05/07/17   [name], MD  glimepiride (AMARYL) 4 MG tablet Take 1 tablet (4 mg total) by mouth 2 (two) times daily. Patient taking differently: Take 4 mg by mouth daily with breakfast.  11/27/17   [name], MD  glucose blood (ACCU-CHEK AVIVA PLUS) test strip Use as directed twice daily to check blood sugar.  DX E11.9 07/15/17   [name], MD    HYDROcodone-acetaminophen (NORCO/VICODIN) 5-325 MG tablet Take 1 tablet by mouth every 4 (four) hours as needed. Patient taking differently: Take 1 tablet by mouth every 4 (four) hours as needed for moderate pain.  03/23/18   Hudnall, Sharyn Lull, MD  ibuprofen (ADVIL,MOTRIN) 600 MG tablet Take 1 tablet (600 mg total) by mouth every 6 (six) hours as needed. 04/01/18   Emily Filbert, MD  ketorolac (ACULAR) 0.5 % ophthalmic solution Place 1 drop into the right eye 4 (four) times daily. 04/26/18 04/26/19  Bernarda Caffey, MD  levothyroxine (SYNTHROID, LEVOTHROID) 25 MCG tablet TAKE 1 TABLET (25 MCG TOTAL) BY MOUTH DAILY BEFORE BREAKFAST. 07/27/17   [name], MD  losartan (COZAAR) 50 MG tablet TAKE 1 TABLET EVERY DAY Patient taking differently: Take 50 mg by mouth daily.  11/04/17   [name], MD  montelukast (SINGULAIR) 10 MG tablet Take 1 tablet (10 mg total) by mouth at bedtime as needed (allergies). 07/15/17   [name], MD  Multiple Vitamins-Minerals (CENTRUM SILVER PO) Take 1 tablet by mouth daily.      [provider]  ondansetron (ZOFRAN ODT) 4 MG disintegrating tablet Take one tab by mouth Q6hr prn nausea.  Dissolve under tongue. Patient taking differently: Take 4 mg by mouth every 6 (six) hours as needed for nausea. Take one tab by mouth Q6hr prn nausea.  Dissolve under tongue. 11/17/17   Kandra Nicolas, MD  oxyCODONE-acetaminophen (PERCOCET/ROXICET) 5-325 MG tablet Take 1 tablet by mouth every 6 (six) hours as needed. Patient not taking: Reported on 04/26/2018 04/01/18   Emily Filbert, MD  oxyCODONE-acetaminophen (PERCOCET/ROXICET) 5-325 MG tablet Take 1 tablet by mouth every 6 (six) hours as needed. Patient not taking: Reported on 04/26/2018 04/01/18   Emily Filbert, MD  pantoprazole (  PROTONIX) 40 MG tablet TAKE 1 TABLET EVERY DAY Patient taking differently: Take 40 mg by mouth daily.  03/12/18   [name], MD  prednisoLONE acetate (PRED FORTE) 1 % ophthalmic suspension  Place 1 drop into the right eye 4 (four) times daily. 04/26/18   Bernarda Caffey, MD  Probiotic Product (ALIGN) 4 MG CAPS Take 1 capsule by mouth daily. Reported on 07/02/2015    [provider]  ramelteon (ROZEREM) 8 MG tablet Take 1 tablet (8 mg total) by mouth at bedtime. 09/24/17   Eksir, Richard Miu, MD  sitaGLIPtin (JANUVIA) 100 MG tablet Take 1 tablet (100 mg total) by mouth daily. 02/23/18   [name], MD  triamcinolone cream (KENALOG) 0.1 % Apply 1 application topically 2 (two) times daily.  03/18/18   [provider]    Family History Family History  Problem Relation Age of Onset  . Diabetes Mother        type II  . Heart attack Mother 7  . Stroke Mother   . Hyperlipidemia Mother   . Colon cancer Sister   . Cancer Sister        GI  . Heart disease Sister   . Asthma Maternal Grandmother   . Mental illness Son        PTSD from [employer]  . Parkinsonism Father   . Cancer Sister        stage 4 bladder cancer  . Colon polyps Sister   . Ulcers Unknown        bleeding gastric  . Heart defect Unknown        child  . Other Unknown        perforated bowel, child  . Cancer Brother        pancreatic cancer  . Amblyopia Neg Hx   . Blindness Neg Hx   . Cataracts Neg Hx   . Glaucoma Neg Hx   . Macular degeneration Neg Hx   . Retinal detachment Neg Hx   . Strabismus Neg Hx   . Retinitis pigmentosa Neg Hx     Social History Social History   Tobacco Use  . Smoking status: Never Smoker  . Smokeless tobacco: Never Used  Substance Use Topics  . Alcohol use: Yes    Alcohol/week: 0.0 standard drinks    Comment: Rare occasions   . Drug use: No     Allergies   Compazine [prochlorperazine edisylate]; Sulfa antibiotics; and Tetanus toxoid   Review of Systems Review of Systems  Respiratory: Positive for shortness of breath. Negative for cough and wheezing.   Cardiovascular: Positive for chest pain. Negative for palpitations and leg swelling.  All  other systems reviewed and are negative.    Physical Exam Updated Vital Signs BP 140/79   Pulse 69   Temp 98.1 F (36.7 C) (Oral)   Resp 17   Ht '5\' 2"'  (1.575 m)   Wt 71.7 kg   LMP  (LMP Unknown)   SpO2 96%   BMI 28.90 kg/m   Physical Exam  Constitutional: She is oriented to person, place, and time. She appears well-developed and well-nourished. No distress.  HENT:  Head: Normocephalic and atraumatic.  Cardiovascular: Normal rate, regular rhythm and normal heart sounds.  No murmur heard. Pulmonary/Chest: Effort normal and breath sounds normal. No respiratory distress. She has no wheezes. She has no rales. She exhibits tenderness.  Tenderness to palpation to left chest wall side and upper back. No overlying skin changes.  Abdominal: Soft. She exhibits no distension. There is no tenderness.  Musculoskeletal: She exhibits no edema.  Neurological: She is alert and oriented to person, place, and time.  Skin: Skin is warm and dry.  Nursing note and vitals reviewed.    ED Treatments / Results  Labs (all labs ordered are listed, but only abnormal results are displayed) Labs Reviewed  BASIC METABOLIC PANEL - Abnormal; Notable for the following components:      Result Value   Glucose, Bld 169 (*)    Calcium 8.8 (*)    All other components within normal limits  CBC  TROPONIN I  TROPONIN I    EKG EKG Interpretation  Date/Time:  Monday May 17 2018 10:40:55 EST Ventricular Rate:  83 PR Interval:    QRS Duration: 97 QT Interval:  423 QTC Calculation: 498 R Axis:   60 Text Interpretation:  Sinus rhythm Borderline prolonged QT interval No acute changes subtle ST elevation in the inferior leads No significant change since last tracing Reconfirmed by Varney Biles [phone]) on 05/17/2018 11:02:09 AM Also confirmed by Varney Biles [phone]), editor Lynder Parents [phone])  on 05/17/2018 2:33:44 PM   Radiology Dg Chest 2 View  Result Date: 05/17/2018 CLINICAL DATA:   Left-sided chest pain and shortness of breath. EXAM: CHEST - 2 VIEW COMPARISON:  Chest x-ray dated September 05, 2015. FINDINGS: The heart size and mediastinal contours are within normal limits. Normal pulmonary vascularity. No focal consolidation, pleural effusion, or pneumothorax. No acute osseous abnormality. IMPRESSION: No active cardiopulmonary disease. Electronically Signed   By: Titus Dubin M.D.   On: 05/17/2018 11:06    Procedures Procedures (including critical care time)  Medications Ordered in ED Medications  ketorolac (TORADOL) 15 MG/ML injection 15 mg (15 mg Intravenous Given 05/17/18 1147)  diazepam (VALIUM) tablet 2 mg (2 mg Oral Given 05/17/18 1147)     Initial Impression / Assessment and Plan / ED Course  I have reviewed the triage vital signs and the nursing notes.  Pertinent labs & imaging results that were available during my care of the patient were reviewed by me and considered in my medical decision making (see chart for details).    Zonnique Norkus is a 67 y.o. female who presents to ED for left-sided chest pain which began early this morning.  On exam, patient is afebrile, hemodynamically stable with significant amount of tenderness to the chest wall.  No overlying skin changes.  EKG doubt acute changes.  Troponin negative x2.  Remainder of lab work reassuring.  Chest x-ray without acute abnormalities including pneumonia.  Given her age and risk factors (hypertension, hyperlipidemia, diabetes and family history), shared decision making about inpatient cardiac work-up versus discharge to home with strict return precautions and close PCP follow-up.  Patient and family at bedside would like outpatient follow-up.  Daughter will stay with her tonight.  Reasons to return to the ER were discussed and all questions answered.  Patient seen by and discussed with Dr. Kathrynn Humble who agrees with treatment plan.    Final Clinical Impressions(s) / ED Diagnoses   Final  diagnoses:  Angina pectoris [facility])    ED Discharge Orders    None       Cheryll Keisler, Ozella Almond, PA-C 05/17/18 [place], MD 05/17/18 1554

## 2018-05-17 NOTE — Discharge Instructions (Signed)
It was my pleasure taking care of you today!   Call your primary care doctor today to schedule a follow up appointment for further work up of your chest pain.   It was my pleasure taking care of you today!   You were seen in the Emergency Department today for chest pain.  As we have discussed, todays blood work and imaging are normal, but you may require further testing.  Return to the Emergency Department if you experience any further chest pain/pressure/tightness, difficulty breathing or other symptoms that concern you.

## 2018-05-17 NOTE — ED Triage Notes (Addendum)
Pt reports 8/10 left sided cp w/ radiation to left back and SOB. Pt reports started last night and worsened this AM. Pt A+OX4.

## 2018-05-17 NOTE — ED Notes (Signed)
Pt on monitor 

## 2018-05-19 ENCOUNTER — Telehealth: Payer: Self-pay

## 2018-05-19 ENCOUNTER — Ambulatory Visit (INDEPENDENT_AMBULATORY_CARE_PROVIDER_SITE_OTHER): Payer: Medicare HMO | Admitting: Medical

## 2018-05-19 ENCOUNTER — Encounter: Payer: Self-pay | Admitting: Medical

## 2018-05-19 VITALS — BP 139/74 | HR 62 | Temp 97.7°F | Resp 16 | Ht 63.0 in | Wt 166.0 lb

## 2018-05-19 DIAGNOSIS — R0789 Other chest pain: Secondary | ICD-10-CM

## 2018-05-19 DIAGNOSIS — M546 Pain in thoracic spine: Secondary | ICD-10-CM

## 2018-05-19 DIAGNOSIS — R079 Chest pain, unspecified: Secondary | ICD-10-CM

## 2018-05-19 DIAGNOSIS — M792 Neuralgia and neuritis, unspecified: Secondary | ICD-10-CM | POA: Diagnosis not present

## 2018-05-19 LAB — TROPONIN I: TNIDX: 0.01 ug/L (ref 0.00–0.06)

## 2018-05-19 MED ORDER — LEVOFLOXACIN 500 MG PO TABS: 500.0000 mg | ORAL_TABLET | Freq: Every day | ORAL | 0 refills | Status: DC

## 2018-05-19 MED ORDER — FAMCICLOVIR 500 MG PO TABS: 500.0000 mg | ORAL_TABLET | Freq: Three times a day (TID) | ORAL | 0 refills | Status: DC

## 2018-05-19 MED ORDER — DICLOFENAC POTASSIUM 50 MG PO TABS: 50.0000 mg | ORAL_TABLET | Freq: Two times a day (BID) | ORAL | 0 refills | Status: DC

## 2018-05-19 NOTE — Patient Instructions (Addendum)
You had  recent back and chest area pain.  Work-up in the emergency department was negative.  However they did offer you to be admitted for further work-up in light of your high risk factors and her family history.  Since discharge from the emergency department your symptoms are still present but stable.  We will repeat troponin stat today.  If your troponin is elevated then will need to refer you to emergency department for further work-up/evaluation.  I did go ahead and put in referral to cardiologist.  Trying to get you seen upstairs either tomorrow or Friday.  This would be very quick referral to cardiologist and hopefully will be successful.  Would recommend that if your symptoms worsen or change then be evaluated at the emergency department.  This is even if you have a negative troponin test.  More important to pay attention to your symptoms rather than results of labs.  Some features of potential muscle pain as you report pain in the back worsen when you attempt to lift your arm up.  So I am going to give you a brief course of diclofenac to see if this has impact on your pain.  Also on exam some superficial sensitivity to skin that extends from the back towards the mid axillary area.  Pain in dermatomal pattern but no rash.  Counseled you today to watch/examined skin area if you see any small blister/rash type eruption then start famciclovir and notify me immediately.  Follow-up in 7 days or as needed.

## 2018-05-19 NOTE — Progress Notes (Signed)
Subjective:    Patient ID: Crystal [name], female    DOB: 08/05/1950, 67 y.o.   MRN: [id number]  HPI  Pt in with some left upper back pain that started early Monday morning around 3 am. Pain does run toward her chest. She states pain goes through to left side. No rash on chest. No superficial pain per pt. She states over past 48 hours pain is same. No jaw pain. No nausea or vomiting. No obvious shortness breath above baseline. Hx of asthma and always wheezes,  Pt had work up yesterday with negative 2 troponins. On 05-17-2018 cxr was negative. Records not that provider had discussion about admission to hospital but they decided against and she follows up  here now. Told needs stress test.  Hx of hyperlipidemia.  Non-smoker. Pt last a1c was 7.4. Pt mom did have MI around 80 yo. Pt mom was smoker.  Pt when she take a deep breath has more back pain.  No leg pain.   Review of Systems  Constitutional: Negative for chills, fatigue and fever.  Respiratory: Negative for cough, chest tightness, shortness of breath and wheezing.   Cardiovascular: Positive for chest pain. Negative for palpitations.  Musculoskeletal: Positive for back pain.  Neurological: Negative for dizziness, speech difficulty, weakness, light-headedness and headaches.  Hematological: Negative for adenopathy. Does not bruise/bleed easily.    Past Medical History:  Diagnosis Date  . Acute peptic ulcer of stomach    As a teenager  . Allergy    dust, cock roach,grass,weeds,molds  . Anxiety   . Arthritis    knees, hands, shoulders  . Asthma    02/08/10 FEV1 1.98 (80%), s/p saba 2.22 l/m (90%).nml  . Broken ankle    right foot, wearing a boot  . Cognitive change 06/10/2016  . Dementia ([place])   . Depression   . Diabetes mellitus 2003   Type II  . Diverticulosis   . Dyspepsia   . Fatty liver 12/19/2010  . GERD (gastroesophageal reflux disease)   . Hearing loss    bilateral hearing aids   . Herpes simplex virus  (HSV) infection   . High cholesterol   . Hyperlipemia   . Hypertension   . Hypothyroidism 09/08/2013  . IBS (irritable bowel syndrome)   . Insomnia   . Internal hemorrhoids   . Joint pain 08/08/2016  . Low back pain 08/10/2016  . OSA (obstructive sleep apnea)    Does not use CPAP - old one broken, new sleep study to be scheduled and then given a new CPAP machine  . Plantar fasciitis    no longer an issue  . Sun-damaged skin 12/09/2016  . SVD (spontaneous vaginal delivery)    x 4  . Vaginal Pap smear, abnormal   . Vitamin D deficiency 03/04/2016  . Wears partial dentures    upper dentures and lower partial     Social History   Socioeconomic History  . Marital status: Widowed    Spouse name: Not on file  . Number of children: 4  . Years of education: Not on file  . Highest education level: Not on file  Occupational History  . Occupation: retired    Fish farm manager: [employer]  Social Needs  . Financial resource strain: Not on file  . Food insecurity:    Worry: Not on file    Inability: Not on file  . Transportation needs:    Medical: Not on file    Non-medical: Not on file  Tobacco Use  .  Smoking status: Never Smoker  . Smokeless tobacco: Never Used  Substance and Sexual Activity  . Alcohol use: Yes    Alcohol/week: 0.0 standard drinks    Comment: Rare occasions   . Drug use: No  . Sexual activity: Not Currently    Birth control/protection: Post-menopausal  Lifestyle  . Physical activity:    Days per week: Not on file    Minutes per session: Not on file  . Stress: Not on file  Relationships  . Social connections:    Talks on phone: Not on file    Gets together: Not on file    Attends religious service: Not on file    Active member of club or organization: Not on file    Attends meetings of clubs or organizations: Not on file    Relationship status: Not on file  . Intimate partner violence:    Fear of current or ex partner: Not on file    Emotionally abused: Not on file     Physically abused: Not on file    Forced sexual activity: Not on file  Other Topics Concern  . Not on file  Social History Narrative   No caffeine drinks daily     Past Surgical History:  Procedure Laterality Date  . APPENDECTOMY     age 88  . CATARACT EXTRACTION Bilateral 2016   Dr. Bing Plume  . CATARACT EXTRACTION, BILATERAL     12/8 and 12/14  . COLONOSCOPY  06/03/2010, 08/25/2011    severeal - polyps and diverticulosis   . DILATATION & CURETTAGE/HYSTEROSCOPY WITH MYOSURE N/A 04/01/2018   Procedure: DILATATION & CURETTAGE/HYSTEROSCOPY WITH MYOSURE POLYPECTOMY;  Surgeon: Emily Filbert, MD;  Location: [facility];  Service: Gynecology;  Laterality: N/A;  . EYE SURGERY     cateracts removed bilateral  . GASTRECTOMY     partial - ulcer as a teen  . HAND SURGERY Right    thumb   . HEMORRHOID BANDING    . KNEE ARTHROSCOPY Left 1998  . NASAL SEPTUM SURGERY    . RHINOPLASTY    . TOE SURGERY Right 1995    Family History  Problem Relation Age of Onset  . Diabetes Mother        type II  . Heart attack Mother 31  . Stroke Mother   . Hyperlipidemia Mother   . Colon cancer Sister   . Cancer Sister        GI  . Heart disease Sister   . Asthma Maternal Grandmother   . Mental illness Son        PTSD from [employer]  . Parkinsonism Father   . Cancer Sister        stage 4 bladder cancer  . Colon polyps Sister   . Ulcers Unknown        bleeding gastric  . Heart defect Unknown        child  . Other Unknown        perforated bowel, child  . Cancer Brother        pancreatic cancer  . Amblyopia Neg Hx   . Blindness Neg Hx   . Cataracts Neg Hx   . Glaucoma Neg Hx   . Macular degeneration Neg Hx   . Retinal detachment Neg Hx   . Strabismus Neg Hx   . Retinitis pigmentosa Neg Hx     Allergies  Allergen Reactions  . Compazine [Prochlorperazine Edisylate]     Comma for 3 days  . Sulfa  Antibiotics Itching    severe itching  . Tetanus Toxoid Other (See Comments)    convulsions      Current Outpatient Medications on File Prior to Visit  Medication Sig Dispense Refill  . ACCU-CHEK SOFTCLIX LANCETS lancets Use as directed twice daily to check blood sugar. DX E11.9 200 each 3  . albuterol (VENTOLIN HFA) 108 (90 Base) MCG/ACT inhaler INHALE 2 PUFFS BY MOUTH INTO THE LUNGS EVERY 4 (FOUR) HOURS AS NEEDED FOR SHORTNESS OF BREATH AND WHEEZING 18 g 1  . Alcohol Swabs (B-D SINGLE USE SWABS REGULAR) PADS Use as directed to check blood sugar. DX E11.9 100 each 3  . atenolol (TENORMIN) 25 MG tablet Take 1 tablet (25 mg total) by mouth daily. 90 tablet 3  . atorvastatin (LIPITOR) 80 MG tablet TAKE 1 TABLET EVERY DAY (Patient taking differently: Take 80 mg by mouth at bedtime. ) 90 tablet 2  . Blood Glucose Calibration (ACCU-CHEK AVIVA) SOLN Use as directed to check blood sugar.  DX E11.9 1 each 3  . Blood Glucose Monitoring Suppl (ACCU-CHEK AVIVA PLUS) w/Device KIT USE AS DIRECTED TO CHECK BLOOD SUGAR. 1 kit 0  . cetirizine (ZYRTEC) 10 MG tablet Take 1 tablet (10 mg total) by mouth daily as needed for allergies. 30 tablet 11  . Cholecalciferol (VITAMIN D3) 2000 units TABS Take 1 tablet by mouth daily.    Marland Kitchen dicyclomine (BENTYL) 10 MG capsule TAKE 1 CAPSULE EVERY 4-6 HOURS AS NEEDED FOR ABDOMINAL PAIN AND CRAMPING OR DIARRHEA (Patient taking differently: Take 10 mg by mouth See admin instructions. TAKE 1 CAPSULE EVERY 4-6 HOURS AS NEEDED FOR ABDOMINAL PAIN AND CRAMPING OR DIARRHEA) 60 capsule 1  . donepezil (ARICEPT) 10 MG tablet Take 1 tablet (10 mg total) by mouth at bedtime. 30 tablet 0  . escitalopram (LEXAPRO) 20 MG tablet Take 1 tablet (20 mg total) by mouth daily. 30 tablet 0  . famciclovir (FAMVIR) 500 MG tablet 1000 mg po bid x 24 hours then 250 mg po bid every day (Patient taking differently: Take 250 mg by mouth 2 (two) times daily. ) 35 tablet 3  . fenofibrate 160 MG tablet TAKE 1 TABLET (160 MG TOTAL) BY MOUTH DAILY. 90 tablet 2  . fluticasone (FLONASE) 50 MCG/ACT nasal spray  Place 2 sprays into both nostrils daily. 16 g 3  . glimepiride (AMARYL) 4 MG tablet Take 1 tablet (4 mg total) by mouth 2 (two) times daily. (Patient taking differently: Take 4 mg by mouth daily with breakfast. ) 180 tablet 1  . glucose blood (ACCU-CHEK AVIVA PLUS) test strip Use as directed twice daily to check blood sugar.  DX E11.9 200 each 3  . HYDROcodone-acetaminophen (NORCO/VICODIN) 5-325 MG tablet Take 1 tablet by mouth every 4 (four) hours as needed. (Patient taking differently: Take 1 tablet by mouth every 4 (four) hours as needed for moderate pain. ) 30 tablet 0  . ibuprofen (ADVIL,MOTRIN) 600 MG tablet Take 1 tablet (600 mg total) by mouth every 6 (six) hours as needed. 30 tablet 1  . ketorolac (ACULAR) 0.5 % ophthalmic solution Place 1 drop into the right eye 4 (four) times daily. 10 mL 0  . levothyroxine (SYNTHROID, LEVOTHROID) 25 MCG tablet TAKE 1 TABLET (25 MCG TOTAL) BY MOUTH DAILY BEFORE BREAKFAST. 90 tablet 2  . losartan (COZAAR) 50 MG tablet TAKE 1 TABLET EVERY DAY (Patient taking differently: Take 50 mg by mouth daily. ) 90 tablet 1  . montelukast (SINGULAIR) 10 MG tablet Take 1  tablet (10 mg total) by mouth at bedtime as needed (allergies). 30 tablet 3  . Multiple Vitamins-Minerals (CENTRUM SILVER PO) Take 1 tablet by mouth daily.      . ondansetron (ZOFRAN ODT) 4 MG disintegrating tablet Take one tab by mouth Q6hr prn nausea.  Dissolve under tongue. (Patient taking differently: Take 4 mg by mouth every 6 (six) hours as needed for nausea. Take one tab by mouth Q6hr prn nausea.  Dissolve under tongue.) 12 tablet 0  . oxyCODONE-acetaminophen (PERCOCET/ROXICET) 5-325 MG tablet Take 1 tablet by mouth every 6 (six) hours as needed. 8 tablet 0  . oxyCODONE-acetaminophen (PERCOCET/ROXICET) 5-325 MG tablet Take 1 tablet by mouth every 6 (six) hours as needed. 6 tablet 0  . pantoprazole (PROTONIX) 40 MG tablet TAKE 1 TABLET EVERY DAY (Patient taking differently: Take 40 mg by mouth daily. )  90 tablet 0  . prednisoLONE acetate (PRED FORTE) 1 % ophthalmic suspension Place 1 drop into the right eye 4 (four) times daily. 10 mL 0  . Probiotic Product (ALIGN) 4 MG CAPS Take 1 capsule by mouth daily. Reported on 07/02/2015    . ramelteon (ROZEREM) 8 MG tablet Take 1 tablet (8 mg total) by mouth at bedtime. 90 tablet 1  . sitaGLIPtin (JANUVIA) 100 MG tablet Take 1 tablet (100 mg total) by mouth daily. 90 tablet 1  . triamcinolone cream (KENALOG) 0.1 % Apply 1 application topically 2 (two) times daily.   0   No current facility-administered medications on file prior to visit.     BP 139/74   Pulse 62   Temp 97.7 F (36.5 C) (Oral)   Resp 16   Ht _0  (1.6 m)   Wt 166 lb (75.3 kg)   LMP  (LMP Unknown)   SpO2 98%   BMI 29.41 kg/m       Objective:   Physical Exam  General Mental Status- Alert. General Appearance- Not in acute distress.   Skin General: Color- Normal Color. Moisture- Normal Moisture.  Neck Carotid Arteries- Normal color. Moisture- Normal Moisture. No carotid bruits. No JVD.  Chest and Lung Exam Auscultation: Breath Sounds:-Normal.  Cardiovascular Auscultation:Rythm- Regular. Murmurs & Other Heart Sounds:Auscultation of the heart reveals- No Murmurs.  Abdomen Inspection:-Inspeection Normal. Palpation/Percussion:Note:No mass. Palpation and Percussion of the abdomen reveal- Non Tender, Non Distended + BS, no rebound or guarding.   Neurologic Cranial Nerve exam:- CN III-XII intact(No nystagmus), symmetric smile. Strength:- 5/5 equal and symmetric strength both upper and lower extremities.  Back pain- has pain on palpation left upper back over superior latisumuss dosri area. Pain worse if tries to lift arm  Ribs- at same level as back in dermatomal pattern shows tender to light touch but no rash. No vesicles seen. No induration or redness.  Lower ext- no calf swelling and negative homans signs.       Assessment & Plan:  [phone].   Daughter Vernie Murders [phone]  You had  recent back and chest area pain.  Work-up in the emergency department was negative.  However they did offer you to be admitted for further work-up in light of your high risk factors and her family history.  Since discharge from the emergency department your symptoms are still present but stable.  We will repeat troponin stat today.  If your troponin is elevated then will need to refer you to emergency department for further work-up/evaluation.  I did go ahead and put in referral to cardiologist.  Trying to get you seen upstairs  either tomorrow or Friday.  This would be very quick referral to cardiologist and hopefully will be successful.  Would recommend that if your symptoms worsen or change then be evaluated at the emergency department.  This is even if you have a negative troponin test.  More important to pay attention to your symptoms rather than results of labs.  Some features of potential muscle pain as you report pain in the back worsen when you attempt to lift your arm up.  So I am going to give you a brief course of diclofenac to see if this has impact on your pain.  Also on exam some superficial sensitivity to skin that extends from the back towards the mid axillary area.  Pain in dermatomal pattern but no rash.  Counseled you today to watch/examined skin area if you see any small blister/rash type eruption then start famciclovir and notify me immediately.  Follow-up in 7 days or as needed.  40 minute spent with pt 50% of time spent counseling in light of challenging circumtances seeing her after ED evaluation with persisting pain. Explained work up today and plan going forward as explained above.  Mackie Pai, PA-C

## 2018-05-19 NOTE — Telephone Encounter (Signed)
-----   Message from Mackie Pai, PA-C sent at 05/19/2018  1:38 PM EST ----- Your troponin test was not elevated. Lower end. So will still try to get you in to cardiologist. If symptoms worsen in back or chest before then recommend ED evaluation.

## 2018-05-19 NOTE — Addendum Note (Signed)
Addended by: Anabel Halon on: 05/19/2018 01:14 PM   Modules accepted: Orders, Level of Service

## 2018-05-19 NOTE — Telephone Encounter (Signed)
Author phoned pt. to relay Edward's message. Pt. verbalized understanding. Pt. Made aware that cardiology should be contacting pt. In next few days to make an appointment.

## 2018-05-21 NOTE — Progress Notes (Signed)
[facility] Note  05/24/2018     CHIEF COMPLAINT Patient presents for Retina Follow Up   HISTORY OF PRESENT ILLNESS: Crystal [name] is a 67 y.o. female who presents to the clinic today for:   HPI    Retina Follow Up    Patient presents with  Retinal Break/Detachment.  In right eye.  Severity is moderate.  Duration of 3 weeks.  I, the attending physician,  performed the HPI with the patient and updated documentation appropriately.          Comments    Patient states vision seems the same OD. OD dry at times with some itching. Last BS was 117 this am. Last A1c was 7.2 around 2 months ago. No new floaters/flashes.       Last edited by Bernarda Caffey, MD on 05/24/2018  2:22 PM. (History)      Referring physician: [name], MD [address] [place], [place] [address]  HISTORICAL INFORMATION:   Selected notes from the MEDICAL RECORD NUMBER Referral from Dr. Bing Plume for retina eval: - last exam by [facility], 03/17/17 - 2 wk history of floaters OU - hx of DM2 since 2003 - pseudophakia OU (complex CEIOL OD, 12.14.16, OS: 11.28.16)   CURRENT MEDICATIONS: Current Outpatient Medications (Ophthalmic Drugs)  Medication Sig  . ketorolac (ACULAR) 0.5 % ophthalmic solution Place 1 drop into the right eye 4 (four) times daily.  . prednisoLONE acetate (PRED FORTE) 1 % ophthalmic suspension Place 1 drop into the right eye 4 (four) times daily.   No current facility-administered medications for this visit.  (Ophthalmic Drugs)   Current Outpatient Medications (Other)  Medication Sig  . ACCU-CHEK SOFTCLIX LANCETS lancets Use as directed twice daily to check blood sugar. DX E11.9  . albuterol (VENTOLIN HFA) 108 (90 Base) MCG/ACT inhaler INHALE 2 PUFFS BY MOUTH INTO THE LUNGS EVERY 4 (FOUR) HOURS AS NEEDED FOR SHORTNESS OF BREATH AND WHEEZING  . Alcohol Swabs (B-D SINGLE USE SWABS REGULAR) PADS Use as directed to check blood sugar. DX E11.9  .  atenolol (TENORMIN) 25 MG tablet Take 1 tablet (25 mg total) by mouth daily.  Marland Kitchen atorvastatin (LIPITOR) 80 MG tablet TAKE 1 TABLET EVERY DAY (Patient taking differently: Take 80 mg by mouth at bedtime. )  . Blood Glucose Calibration (ACCU-CHEK AVIVA) SOLN Use as directed to check blood sugar.  DX E11.9  . Blood Glucose Monitoring Suppl (ACCU-CHEK AVIVA PLUS) w/Device KIT USE AS DIRECTED TO CHECK BLOOD SUGAR.  . cetirizine (ZYRTEC) 10 MG tablet Take 1 tablet (10 mg total) by mouth daily as needed for allergies.  . Cholecalciferol (VITAMIN D3) 2000 units TABS Take 1 tablet by mouth daily.  . diclofenac (CATAFLAM) 50 MG tablet Take 1 tablet (50 mg total) by mouth 2 (two) times daily.  Marland Kitchen dicyclomine (BENTYL) 10 MG capsule TAKE 1 CAPSULE EVERY 4-6 HOURS AS NEEDED FOR ABDOMINAL PAIN AND CRAMPING OR DIARRHEA (Patient taking differently: Take 10 mg by mouth See admin instructions. TAKE 1 CAPSULE EVERY 4-6 HOURS AS NEEDED FOR ABDOMINAL PAIN AND CRAMPING OR DIARRHEA)  . donepezil (ARICEPT) 10 MG tablet Take 1 tablet (10 mg total) by mouth at bedtime.  Marland Kitchen escitalopram (LEXAPRO) 20 MG tablet Take 1 tablet (20 mg total) by mouth daily.  . famciclovir (FAMVIR) 500 MG tablet 1000 mg po bid x 24 hours then 250 mg po bid every day (Patient taking differently: Take 250 mg by mouth 2 (  two) times daily. )  . famciclovir (FAMVIR) 500 MG tablet Take 1 tablet (500 mg total) by mouth 3 (three) times daily.  . fenofibrate 160 MG tablet TAKE 1 TABLET (160 MG TOTAL) BY MOUTH DAILY.  . fluticasone (FLONASE) 50 MCG/ACT nasal spray Place 2 sprays into both nostrils daily.  Marland Kitchen glimepiride (AMARYL) 4 MG tablet Take 1 tablet (4 mg total) by mouth 2 (two) times daily. (Patient taking differently: Take 4 mg by mouth daily with breakfast. )  . glucose blood (ACCU-CHEK AVIVA PLUS) test strip Use as directed twice daily to check blood sugar.  DX E11.9  . HYDROcodone-acetaminophen (NORCO/VICODIN) 5-325 MG tablet Take 1 tablet by mouth every  4 (four) hours as needed. (Patient taking differently: Take 1 tablet by mouth every 4 (four) hours as needed for moderate pain. )  . ibuprofen (ADVIL,MOTRIN) 600 MG tablet Take 1 tablet (600 mg total) by mouth every 6 (six) hours as needed.  Marland Kitchen levofloxacin (LEVAQUIN) 500 MG tablet Take 1 tablet (500 mg total) by mouth daily.  Marland Kitchen levothyroxine (SYNTHROID, LEVOTHROID) 25 MCG tablet TAKE 1 TABLET (25 MCG TOTAL) BY MOUTH DAILY BEFORE BREAKFAST.  Marland Kitchen losartan (COZAAR) 50 MG tablet TAKE 1 TABLET EVERY DAY (Patient taking differently: Take 50 mg by mouth daily. )  . montelukast (SINGULAIR) 10 MG tablet Take 1 tablet (10 mg total) by mouth at bedtime as needed (allergies).  . Multiple Vitamins-Minerals (CENTRUM SILVER PO) Take 1 tablet by mouth daily.    . ondansetron (ZOFRAN ODT) 4 MG disintegrating tablet Take one tab by mouth Q6hr prn nausea.  Dissolve under tongue. (Patient taking differently: Take 4 mg by mouth every 6 (six) hours as needed for nausea. Take one tab by mouth Q6hr prn nausea.  Dissolve under tongue.)  . oxyCODONE-acetaminophen (PERCOCET/ROXICET) 5-325 MG tablet Take 1 tablet by mouth every 6 (six) hours as needed.  Marland Kitchen oxyCODONE-acetaminophen (PERCOCET/ROXICET) 5-325 MG tablet Take 1 tablet by mouth every 6 (six) hours as needed.  . pantoprazole (PROTONIX) 40 MG tablet TAKE 1 TABLET EVERY DAY (Patient taking differently: Take 40 mg by mouth daily. )  . Probiotic Product (ALIGN) 4 MG CAPS Take 1 capsule by mouth daily. Reported on 07/02/2015  . ramelteon (ROZEREM) 8 MG tablet Take 1 tablet (8 mg total) by mouth at bedtime.  . sitaGLIPtin (JANUVIA) 100 MG tablet Take 1 tablet (100 mg total) by mouth daily.  Marland Kitchen triamcinolone cream (KENALOG) 0.1 % Apply 1 application topically 2 (two) times daily.    No current facility-administered medications for this visit.  (Other)      REVIEW OF SYSTEMS: ROS    Positive for: Gastrointestinal, Musculoskeletal, Endocrine, Cardiovascular, Eyes,  Respiratory, Psychiatric   Negative for: Constitutional, Neurological, Skin, Genitourinary, HENT, Allergic/Imm, Heme/Lymph   Last edited by Roselee Nova D on 05/24/2018  1:40 PM. (History)       ALLERGIES Allergies  Allergen Reactions  . Compazine [Prochlorperazine Edisylate]     Comma for 3 days  . Sulfa Antibiotics Itching    severe itching  . Tetanus Toxoid Other (See Comments)    convulsions    PAST MEDICAL HISTORY Past Medical History:  Diagnosis Date  . Acute peptic ulcer of stomach    As a teenager  . Allergy    dust, cock roach,grass,weeds,molds  . Anxiety   . Arthritis    knees, hands, shoulders  . Asthma    02/08/10 FEV1 1.98 (80%), s/p saba 2.22 l/m (90%).nml  . Broken ankle  right foot, wearing a boot  . Cognitive change 06/10/2016  . Dementia ([place])   . Depression   . Diabetes mellitus 2003   Type II  . Diverticulosis   . Dyspepsia   . Fatty liver 12/19/2010  . GERD (gastroesophageal reflux disease)   . Hearing loss    bilateral hearing aids   . Herpes simplex virus (HSV) infection   . High cholesterol   . Hyperlipemia   . Hypertension   . Hypothyroidism 09/08/2013  . IBS (irritable bowel syndrome)   . Insomnia   . Internal hemorrhoids   . Joint pain 08/08/2016  . Low back pain 08/10/2016  . OSA (obstructive sleep apnea)    Does not use CPAP - old one broken, new sleep study to be scheduled and then given a new CPAP machine  . Plantar fasciitis    no longer an issue  . Sun-damaged skin 12/09/2016  . SVD (spontaneous vaginal delivery)    x 4  . Vaginal Pap smear, abnormal   . Vitamin D deficiency 03/04/2016  . Wears partial dentures    upper dentures and lower partial   Past Surgical History:  Procedure Laterality Date  . APPENDECTOMY     age 49  . CATARACT EXTRACTION Bilateral 2016   Dr. Bing Plume  . CATARACT EXTRACTION, BILATERAL     12/8 and 12/14  . COLONOSCOPY  06/03/2010, 08/25/2011    severeal - polyps and diverticulosis   .  DILATATION & CURETTAGE/HYSTEROSCOPY WITH MYOSURE N/A 04/01/2018   Procedure: DILATATION & CURETTAGE/HYSTEROSCOPY WITH MYOSURE POLYPECTOMY;  Surgeon: Emily Filbert, MD;  Location: [facility];  Service: Gynecology;  Laterality: N/A;  . EYE SURGERY     cateracts removed bilateral  . GASTRECTOMY     partial - ulcer as a teen  . HAND SURGERY Right    thumb   . HEMORRHOID BANDING    . KNEE ARTHROSCOPY Left 1998  . NASAL SEPTUM SURGERY    . RHINOPLASTY    . TOE SURGERY Right 1995    FAMILY HISTORY Family History  Problem Relation Age of Onset  . Diabetes Mother        type II  . Heart attack Mother 73  . Stroke Mother   . Hyperlipidemia Mother   . Colon cancer Sister   . Cancer Sister        GI  . Heart disease Sister   . Asthma Maternal Grandmother   . Mental illness Son        PTSD from [employer]  . Parkinsonism Father   . Cancer Sister        stage 4 bladder cancer  . Colon polyps Sister   . Ulcers Unknown        bleeding gastric  . Heart defect Unknown        child  . Other Unknown        perforated bowel, child  . Cancer Brother        pancreatic cancer  . Amblyopia Neg Hx   . Blindness Neg Hx   . Cataracts Neg Hx   . Glaucoma Neg Hx   . Macular degeneration Neg Hx   . Retinal detachment Neg Hx   . Strabismus Neg Hx   . Retinitis pigmentosa Neg Hx     SOCIAL HISTORY Social History   Tobacco Use  . Smoking status: Never Smoker  . Smokeless tobacco: Never Used  Substance Use Topics  . Alcohol use: Yes  Alcohol/week: 0.0 standard drinks    Comment: Rare occasions   . Drug use: No         OPHTHALMIC EXAM:  Base Eye Exam    Visual Acuity (Snellen - Linear)      Right Left   Dist cc 20/50 20/20 -2   Dist ph cc 20/40    Correction:  Glasses       Tonometry (Tonopen, 1:53 PM)      Right Left   Pressure 21 15       Pupils      Dark Light Shape React APD   Right 4 4 Irregular Minimal None   Left 4 3 Round Slow None       Visual Fields (Counting  fingers)      Left Right    Full Full       Extraocular Movement      Right Left    Full, Ortho Full, Ortho       Neuro/Psych    Oriented x3:  Yes   Mood/Affect:  Normal       Dilation    Both eyes:  1.0% Mydriacyl, 2.5% Phenylephrine @ 1:53 PM        Slit Lamp and Fundus Exam    External Exam      Right Left   External Normal Normal       Slit Lamp Exam      Right Left   Lids/Lashes Dermatochalasis - upper lid temporally , Telangiectasia Dermatochalasis - upper lid temporally , Telangiectasia   Conjunctiva/Sclera White and quiet White and quiet   Cornea Nylon mattress suture at 0900; cataract wound closed at 0900, NV at 0900 wound Well healed cataract wounds   Anterior Chamber Deep and quiet Deep and quiet   Iris Round and dilated; 5.5 mm; TID at temporal pupil margin Round and dilated; 7.5 mm; PPM at 0600    Lens Three piece Posterior chamber intraocular lens ?sulcus Posterior chamber intraocular lens - good position   Vitreous Vitreous syneresis Vitreous syneresis; Posterior vitreous detachment       Fundus Exam      Right Left   Disc Normal rim; cupped, inf and temp PPP Normal rim, cupped, temp PPP   C/D Ratio 0.7 0.65   Macula Blunted foveal reflex, Epiretinal membrane, +CME -- improved from prior  foveal reflex, Blunted foveal reflex, Retinal pigment epithelial mottling, No heme or edema   Vessels Vascular attenuation - mild Vascular attenuation - mild   Periphery flat; attached; trace RPE changes; hair line Retinal tears at 3 and 4 o'clock w/ good laser scars surrounding; peripheral cystoid degeneration; supratemporal cobblestoning  Flat; attached; trace RPE changes, peripheral cystoid degneration        Refraction    Wearing Rx      Sphere Cylinder Axis Add   Right -1.25 +0.75 088 +2.50   Left -1.25 +0.75 110 +2.50   Type:  PAL          IMAGING AND PROCEDURES  Imaging and Procedures for 04/30/17  OCT, Retina - OU - Both Eyes       Right  Eye Quality was good. Central Foveal Thickness: 453. Progression has improved. Findings include abnormal foveal contour, epiretinal membrane, intraretinal fluid, subretinal fluid, cystoid macular edema (Interval improvement in IRF/SRF).   Left Eye Quality was good. Central Foveal Thickness: 276. Progression has been stable. Findings include normal foveal contour, no IRF, no SRF.   Notes Images taken, stored on drive  Diagnosis / Impression:  OD - ERM, recurrent CME with interval improvement in IRF/SRF OS - NFP; no IRF/SRF  Clinical management:  See below  Abbreviations: NFP - Normal foveal profile. CME - cystoid macular edema. PED - pigment epithelial detachment. IRF - intraretinal fluid. SRF - subretinal fluid. EZ - ellipsoid zone. ERM - epiretinal membrane. ORA - outer retinal atrophy. ORT - outer retinal tubulation. SRHM - subretinal hyper-reflective material                  ASSESSMENT/PLAN:    ICD-10-CM   1. 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 Posterior vitreous detachment of both eyes H43.813   3. Type 2 diabetes mellitus without complication, without long-term current use of insulin (HCC) E11.9   4. Pseudophakia of both eyes Z96.1   5. CME (cystoid macular edema), right H35.351   6. Epiretinal membrane (ERM) of right eye H35.371   7. Cystoid macular edema of right eye H35.351   8. Retinal edema H35.81 OCT, Retina - OU - Both Eyes    1. Peripheral retinal tears OD  Small flap tears at ora, 0300 and 0400 oclock  s/p barrier laser retinopexy OD, 03/18/17 -- good laser in place  Stable; No new RT/RD  2. PVD / vitreous syneresis OU  Discussed findings and prognosis  No RT or RD on 360 scleral depressed exam OS  Reviewed s/s of RT/RD  Strict return precautions for any such RT/RD signs/symptoms  3. DM2 without retinopathy  On oral meds  Discussed importance of tight BG and BP control  4. Pseudophakia OU  IOLs in good position OU --  beautiful surgeries by Dr. Bing Plume  OD with history of anterior vitrectomy and 3 piece sulcus IOL  monitor  5. CME OD  Recurrent on 10.14.19  Etiology could be multifactorial -- ERM, delayed post-cataract  Improving on PF and Ketorolac QID OD  Discussed other treatment options including subtenon's kenalog, intravitreal steroid  Pt wishes to continue with topical therapy for now -- reasonable  F/U in 4 weeks  6. Epiretinal membrane, right eye   The natural history, anatomy, potential for loss of vision, and treatment options including vitrectomy techniques and the complications of endophthalmitis, retinal detachment, vitreous hemorrhage and permanent vision loss discussed with the patient  Mild ERM OD  May be contributing to CME OD (#5)  Not yet surgical -- monitor for pucker / symptoms  F/u in 1 year     Ophthalmic Meds Ordered this visit:  Meds ordered this encounter  Medications  . prednisoLONE acetate (PRED FORTE) 1 % ophthalmic suspension    Sig: Place 1 drop into the right eye 4 (four) times daily.    Dispense:  10 mL    Refill:  0  . ketorolac (ACULAR) 0.5 % ophthalmic solution    Sig: Place 1 drop into the right eye 4 (four) times daily.    Dispense:  10 mL    Refill:  0       Return in about 4 weeks (around 06/21/2018) for Dilated Exam, OCT.  There are no Patient Instructions on file for this visit.   Explained the diagnoses, plan, and follow up with the patient and they expressed understanding.  Patient expressed understanding of the importance of proper follow up care.   This document serves as a record of services personally performed by Gardiner Sleeper, MD, PhD. It was created on their behalf by Ernest Mallick, OA, an ophthalmic assistant. The  creation of this record is the provider's dictation and/or activities during the visit.    Electronically signed by: Ernest Mallick, OA  11.08.19 2:29 PM    Gardiner Sleeper, M.D., Ph.D. Diseases & Surgery of the Retina and  Vitreous [facility]  I have reviewed the above documentation for accuracy and completeness, and I agree with the above. Gardiner Sleeper, M.D., Ph.D. 05/24/18 2:29 PM      Abbreviations: M myopia (nearsighted); A astigmatism; H hyperopia (farsighted); P presbyopia; Mrx spectacle prescription;  CTL contact lenses; OD right eye; OS left eye; OU both eyes  XT exotropia; ET esotropia; PEK punctate epithelial keratitis; PEE punctate epithelial erosions; DES dry eye syndrome; MGD meibomian gland dysfunction; ATs artificial tears; PFAT's preservative free artificial tears; [place] nuclear sclerotic cataract; PSC posterior subcapsular cataract; ERM epi-retinal membrane; PVD posterior vitreous detachment; RD retinal detachment; DM diabetes mellitus; DR diabetic retinopathy; NPDR non-proliferative diabetic retinopathy; PDR proliferative diabetic retinopathy; CSME clinically significant macular edema; DME diabetic macular edema; dbh dot blot hemorrhages; CWS cotton wool spot; POAG primary open angle glaucoma; C/D cup-to-disc ratio; HVF humphrey visual field; GVF goldmann visual field; OCT optical coherence tomography; IOP intraocular pressure; BRVO Branch retinal vein occlusion; CRVO central retinal vein occlusion; CRAO central retinal artery occlusion; BRAO branch retinal artery occlusion; RT retinal tear; SB scleral buckle; PPV pars plana vitrectomy; VH Vitreous hemorrhage; PRP panretinal laser photocoagulation; IVK intravitreal kenalog; VMT vitreomacular traction; MH Macular hole;  NVD neovascularization of the disc; NVE neovascularization elsewhere; AREDS age related eye disease study; ARMD age related macular degeneration; POAG primary open angle glaucoma; EBMD epithelial/anterior basement membrane dystrophy; ACIOL anterior chamber intraocular lens; IOL intraocular lens; PCIOL posterior chamber intraocular lens; Phaco/IOL phacoemulsification with intraocular lens placement; [place] photorefractive  keratectomy; LASIK laser assisted in situ keratomileusis; HTN hypertension; DM diabetes mellitus; COPD chronic obstructive pulmonary disease

## 2018-05-24 ENCOUNTER — Ambulatory Visit (INDEPENDENT_AMBULATORY_CARE_PROVIDER_SITE_OTHER): Payer: Medicare HMO | Admitting: Ophthalmology

## 2018-05-24 ENCOUNTER — Encounter (INDEPENDENT_AMBULATORY_CARE_PROVIDER_SITE_OTHER): Payer: Self-pay | Admitting: Ophthalmology

## 2018-05-24 DIAGNOSIS — E119 Type 2 diabetes mellitus without complications: Secondary | ICD-10-CM

## 2018-05-24 DIAGNOSIS — H3581 Retinal edema: Secondary | ICD-10-CM

## 2018-05-24 DIAGNOSIS — H35371 Puckering of macula, right eye: Secondary | ICD-10-CM

## 2018-05-24 DIAGNOSIS — H35351 Cystoid macular degeneration, right eye: Secondary | ICD-10-CM | POA: Diagnosis not present

## 2018-05-24 DIAGNOSIS — Z961 Presence of intraocular lens: Secondary | ICD-10-CM

## 2018-05-24 DIAGNOSIS — H33331 Multiple defects of retina without detachment, right eye: Secondary | ICD-10-CM

## 2018-05-24 DIAGNOSIS — H43813 Vitreous degeneration, bilateral: Secondary | ICD-10-CM

## 2018-05-24 MED ORDER — PREDNISOLONE ACETATE 1 % OP SUSP: 1.0000 [drp] | Freq: Four times a day (QID) | OPHTHALMIC | 0 refills | Status: DC

## 2018-05-24 MED ORDER — KETOROLAC TROMETHAMINE 0.5 % OP SOLN: 1.0000 [drp] | Freq: Four times a day (QID) | OPHTHALMIC | 0 refills | Status: AC

## 2018-05-26 ENCOUNTER — Telehealth: Payer: Self-pay | Admitting: Medical

## 2018-05-26 MED ORDER — MELOXICAM 7.5 MG PO TABS: 7.5000 mg | ORAL_TABLET | Freq: Every day | ORAL | 0 refills | Status: DC

## 2018-05-26 MED FILL — MELOXICAM 7.5 MG TABLET: 7.5 | 30 days supply | Qty: 30 | Fill #0

## 2018-05-26 NOTE — Telephone Encounter (Signed)
Rx meloxicam sent to pt pharmacy. 

## 2018-05-28 ENCOUNTER — Other Ambulatory Visit: Payer: Self-pay | Admitting: Physician Assistant

## 2018-05-28 MED FILL — JANUVIA 100 MG TABLET: 100 | 30 days supply | Qty: 30 | Fill #3

## 2018-05-31 ENCOUNTER — Encounter: Payer: Self-pay | Admitting: Family Medicine

## 2018-05-31 ENCOUNTER — Ambulatory Visit (INDEPENDENT_AMBULATORY_CARE_PROVIDER_SITE_OTHER): Payer: Worker's Compensation | Admitting: Family Medicine

## 2018-05-31 VITALS — BP 161/88 | HR 82 | Ht 62.0 in | Wt 158.0 lb

## 2018-05-31 DIAGNOSIS — S99911D Unspecified injury of right ankle, subsequent encounter: Secondary | ICD-10-CM | POA: Diagnosis not present

## 2018-05-31 MED FILL — PREDNISOLONE AC 1% EYE DROP: 1 | 50 days supply | Qty: 10 | Fill #0

## 2018-05-31 MED FILL — KETOROLAC 0.5% OPHTH SOLN: 0.5 | 50 days supply | Qty: 10 | Fill #0

## 2018-05-31 NOTE — Progress Notes (Signed)
PCP: [name], MD  Subjective:   HPI: Patient is a 67 y.o. female here for right foot/ankle injury.  9/10: Patient reports while at work 9/5 she accidentally fell, inverted her right ankle badly and struck middle of low back on counter as she fell down. Pain level in her splint is now 0/10 but was sharp. Unable to bear weight initially. Currently in wheelchair. + swelling and bruising. No prior injuries. Intermittently had numbness.  9/17: Patient reports she's doing well. Pain currently at 0/10 level. Wearing boot regularly. Using knee scooter though she has put a little weight on her foot. Taking aleve some. Is elevating but no needed the norco or icing. Pain is an achiness when this comes on. No numbness.  10/1: Patient returns reporting she's much better. Pain level 0/10. Wearing cam walker and using knee scooter but reports she's able to bear weight without pain in short bursts. Not taking anything for pain. No numbness.  10/17: Patient reports she's doing well. Pain still an achiness, worse when on feet for prolonged period. Swelling with some stiffness. Pain currently 0/10. Out of boot for past 3-4 days now. No skin changes.  11/18: Patient states she is doing very well.  She denies any pain at this time and experiences at most 1/10 pain with working.  She is return to full duty at work and is tolerating this well.  She only experiences occasional mild soreness.  She does not use any ice or require pain medications.  She has not been doing home exercises.  She denies any continued swelling, bruising, or erythema.  No numbness or tingling.  No associated skin changes  Past Medical History:  Diagnosis Date  . Acute peptic ulcer of stomach    As a teenager  . Allergy    dust, cock roach,grass,weeds,molds  . Anxiety   . Arthritis    knees, hands, shoulders  . Asthma    02/08/10 FEV1 1.98 (80%), s/p saba 2.22 l/m (90%).nml  . Broken ankle    right foot,  wearing a boot  . Cognitive change 06/10/2016  . Dementia ([place])   . Depression   . Diabetes mellitus 2003   Type II  . Diverticulosis   . Dyspepsia   . Fatty liver 12/19/2010  . GERD (gastroesophageal reflux disease)   . Hearing loss    bilateral hearing aids   . Herpes simplex virus (HSV) infection   . High cholesterol   . Hyperlipemia   . Hypertension   . Hypothyroidism 09/08/2013  . IBS (irritable bowel syndrome)   . Insomnia   . Internal hemorrhoids   . Joint pain 08/08/2016  . Low back pain 08/10/2016  . OSA (obstructive sleep apnea)    Does not use CPAP - old one broken, new sleep study to be scheduled and then given a new CPAP machine  . Plantar fasciitis    no longer an issue  . Sun-damaged skin 12/09/2016  . SVD (spontaneous vaginal delivery)    x 4  . Vaginal Pap smear, abnormal   . Vitamin D deficiency 03/04/2016  . Wears partial dentures    upper dentures and lower partial    Current Outpatient Medications on File Prior to Visit  Medication Sig Dispense Refill  . ACCU-CHEK SOFTCLIX LANCETS lancets Use as directed twice daily to check blood sugar. DX E11.9 200 each 3  . albuterol (VENTOLIN HFA) 108 (90 Base) MCG/ACT inhaler INHALE 2 PUFFS BY MOUTH INTO THE LUNGS EVERY 4 (  FOUR) HOURS AS NEEDED FOR SHORTNESS OF BREATH AND WHEEZING 18 g 1  . Alcohol Swabs (B-D SINGLE USE SWABS REGULAR) PADS Use as directed to check blood sugar. DX E11.9 100 each 3  . atenolol (TENORMIN) 25 MG tablet Take 1 tablet (25 mg total) by mouth daily. 90 tablet 3  . atorvastatin (LIPITOR) 80 MG tablet TAKE 1 TABLET EVERY DAY (Patient taking differently: Take 80 mg by mouth at bedtime. ) 90 tablet 2  . Blood Glucose Calibration (ACCU-CHEK AVIVA) SOLN Use as directed to check blood sugar.  DX E11.9 1 each 3  . Blood Glucose Monitoring Suppl (ACCU-CHEK AVIVA PLUS) w/Device KIT USE AS DIRECTED TO CHECK BLOOD SUGAR. 1 kit 0  . cetirizine (ZYRTEC) 10 MG tablet Take 1 tablet (10 mg total) by mouth daily  as needed for allergies. 30 tablet 11  . Cholecalciferol (VITAMIN D3) 2000 units TABS Take 1 tablet by mouth daily.    Marland Kitchen dicyclomine (BENTYL) 10 MG capsule TAKE 1 CAPSULE EVERY 4-6 HOURS AS NEEDED FOR ABDOMINAL PAIN AND CRAMPING OR DIARRHEA (Patient taking differently: Take 10 mg by mouth See admin instructions. TAKE 1 CAPSULE EVERY 4-6 HOURS AS NEEDED FOR ABDOMINAL PAIN AND CRAMPING OR DIARRHEA) 60 capsule 1  . donepezil (ARICEPT) 10 MG tablet Take 1 tablet (10 mg total) by mouth at bedtime. 30 tablet 0  . escitalopram (LEXAPRO) 20 MG tablet Take 1 tablet (20 mg total) by mouth daily. 30 tablet 0  . famciclovir (FAMVIR) 500 MG tablet 1000 mg po bid x 24 hours then 250 mg po bid every day (Patient taking differently: Take 250 mg by mouth 2 (two) times daily. ) 35 tablet 3  . famciclovir (FAMVIR) 500 MG tablet Take 1 tablet (500 mg total) by mouth 3 (three) times daily. 21 tablet 0  . fenofibrate 160 MG tablet TAKE 1 TABLET (160 MG TOTAL) BY MOUTH DAILY. 90 tablet 2  . fluticasone (FLONASE) 50 MCG/ACT nasal spray Place 2 sprays into both nostrils daily. 16 g 3  . glimepiride (AMARYL) 4 MG tablet Take 1 tablet (4 mg total) by mouth 2 (two) times daily. (Patient taking differently: Take 4 mg by mouth daily with breakfast. ) 180 tablet 1  . glucose blood (ACCU-CHEK AVIVA PLUS) test strip Use as directed twice daily to check blood sugar.  DX E11.9 200 each 3  . HYDROcodone-acetaminophen (NORCO/VICODIN) 5-325 MG tablet Take 1 tablet by mouth every 4 (four) hours as needed. (Patient taking differently: Take 1 tablet by mouth every 4 (four) hours as needed for moderate pain. ) 30 tablet 0  . ibuprofen (ADVIL,MOTRIN) 600 MG tablet Take 1 tablet (600 mg total) by mouth every 6 (six) hours as needed. 30 tablet 1  . ketorolac (ACULAR) 0.5 % ophthalmic solution Place 1 drop into the right eye 4 (four) times daily. 10 mL 0  . levofloxacin (LEVAQUIN) 500 MG tablet Take 1 tablet (500 mg total) by mouth daily. 7 tablet  0  . levothyroxine (SYNTHROID, LEVOTHROID) 25 MCG tablet TAKE 1 TABLET (25 MCG TOTAL) BY MOUTH DAILY BEFORE BREAKFAST. 90 tablet 2  . losartan (COZAAR) 50 MG tablet TAKE 1 TABLET EVERY DAY (Patient taking differently: Take 50 mg by mouth daily. ) 90 tablet 1  . meloxicam (MOBIC) 7.5 MG tablet Take 1 tablet (7.5 mg total) by mouth daily. 30 tablet 0  . montelukast (SINGULAIR) 10 MG tablet Take 1 tablet (10 mg total) by mouth at bedtime as needed (allergies). 30 tablet 3  .  Multiple Vitamins-Minerals (CENTRUM SILVER PO) Take 1 tablet by mouth daily.      . ondansetron (ZOFRAN ODT) 4 MG disintegrating tablet Take one tab by mouth Q6hr prn nausea.  Dissolve under tongue. (Patient taking differently: Take 4 mg by mouth every 6 (six) hours as needed for nausea. Take one tab by mouth Q6hr prn nausea.  Dissolve under tongue.) 12 tablet 0  . oxyCODONE-acetaminophen (PERCOCET/ROXICET) 5-325 MG tablet Take 1 tablet by mouth every 6 (six) hours as needed. 8 tablet 0  . oxyCODONE-acetaminophen (PERCOCET/ROXICET) 5-325 MG tablet Take 1 tablet by mouth every 6 (six) hours as needed. 6 tablet 0  . pantoprazole (PROTONIX) 40 MG tablet TAKE 1 TABLET EVERY DAY (Patient taking differently: Take 40 mg by mouth daily. ) 90 tablet 0  . prednisoLONE acetate (PRED FORTE) 1 % ophthalmic suspension Place 1 drop into the right eye 4 (four) times daily. 10 mL 0  . Probiotic Product (ALIGN) 4 MG CAPS Take 1 capsule by mouth daily. Reported on 07/02/2015    . ramelteon (ROZEREM) 8 MG tablet Take 1 tablet (8 mg total) by mouth at bedtime. 90 tablet 1  . sitaGLIPtin (JANUVIA) 100 MG tablet Take 1 tablet (100 mg total) by mouth daily. 90 tablet 1  . triamcinolone cream (KENALOG) 0.1 % Apply 1 application topically 2 (two) times daily.   0   No current facility-administered medications on file prior to visit.     Past Surgical History:  Procedure Laterality Date  . APPENDECTOMY     age 68  . CATARACT EXTRACTION Bilateral 2016    Dr. Bing Plume  . CATARACT EXTRACTION, BILATERAL     12/8 and 12/14  . COLONOSCOPY  06/03/2010, 08/25/2011    severeal - polyps and diverticulosis   . DILATATION & CURETTAGE/HYSTEROSCOPY WITH MYOSURE N/A 04/01/2018   Procedure: DILATATION & CURETTAGE/HYSTEROSCOPY WITH MYOSURE POLYPECTOMY;  Surgeon: Emily Filbert, MD;  Location: [facility];  Service: Gynecology;  Laterality: N/A;  . EYE SURGERY     cateracts removed bilateral  . GASTRECTOMY     partial - ulcer as a teen  . HAND SURGERY Right    thumb   . HEMORRHOID BANDING    . KNEE ARTHROSCOPY Left 1998  . NASAL SEPTUM SURGERY    . RHINOPLASTY    . TOE SURGERY Right 1995    Allergies  Allergen Reactions  . Compazine [Prochlorperazine Edisylate]     Comma for 3 days  . Sulfa Antibiotics Itching    severe itching  . Tetanus Toxoid Other (See Comments)    convulsions    Social History   Socioeconomic History  . Marital status: Widowed    Spouse name: Not on file  . Number of children: 4  . Years of education: Not on file  . Highest education level: Not on file  Occupational History  . Occupation: retired    Fish farm manager: [employer]  Social Needs  . Financial resource strain: Not on file  . Food insecurity:    Worry: Not on file    Inability: Not on file  . Transportation needs:    Medical: Not on file    Non-medical: Not on file  Tobacco Use  . Smoking status: Never Smoker  . Smokeless tobacco: Never Used  Substance and Sexual Activity  . Alcohol use: Yes    Alcohol/week: 0.0 standard drinks    Comment: Rare occasions   . Drug use: No  . Sexual activity: Not Currently    Birth control/protection:  Post-menopausal  Lifestyle  . Physical activity:    Days per week: Not on file    Minutes per session: Not on file  . Stress: Not on file  Relationships  . Social connections:    Talks on phone: Not on file    Gets together: Not on file    Attends religious service: Not on file    Active member of club or organization: Not on  file    Attends meetings of clubs or organizations: Not on file    Relationship status: Not on file  . Intimate partner violence:    Fear of current or ex partner: Not on file    Emotionally abused: Not on file    Physically abused: Not on file    Forced sexual activity: Not on file  Other Topics Concern  . Not on file  Social History Narrative   No caffeine drinks daily     Family History  Problem Relation Age of Onset  . Diabetes Mother        type II  . Heart attack Mother 66  . Stroke Mother   . Hyperlipidemia Mother   . Colon cancer Sister   . Cancer Sister        GI  . Heart disease Sister   . Asthma Maternal Grandmother   . Mental illness Son        PTSD from [employer]  . Parkinsonism Father   . Cancer Sister        stage 4 bladder cancer  . Colon polyps Sister   . Ulcers Unknown        bleeding gastric  . Heart defect Unknown        child  . Other Unknown        perforated bowel, child  . Cancer Brother        pancreatic cancer  . Amblyopia Neg Hx   . Blindness Neg Hx   . Cataracts Neg Hx   . Glaucoma Neg Hx   . Macular degeneration Neg Hx   . Retinal detachment Neg Hx   . Strabismus Neg Hx   . Retinitis pigmentosa Neg Hx     LMP  (LMP Unknown)   Review of Systems: See HPI above.     Objective:  Physical Exam:  GEN: Awake, alert, no acute distress Pulmonary: Breathing unlabored  Right ankle: No gross deformity, swelling, bruising or erythema Mild tenderness over the lateral ankle inferior to the lateral malleolus Full range of motion.  Very mild discomfort with eversion 5/5 strength N/V intact distally  Assessment & Plan:  1.  Right ankle pain secondary to lateral process fracture of the talus- patient has no further physical limitations. - Ice or NSAIDs as needed for pain - Encouraged to continue home exercises - Follow-up as needed

## 2018-05-31 NOTE — Patient Instructions (Signed)
Do your strengthening exercises with the theraband!  3-4 times a week for 4 weeks. Follow up with me as needed.

## 2018-06-01 DIAGNOSIS — R0789 Other chest pain: Secondary | ICD-10-CM | POA: Insufficient documentation

## 2018-06-01 DIAGNOSIS — R071 Chest pain on breathing: Secondary | ICD-10-CM

## 2018-06-01 NOTE — Progress Notes (Signed)
Cardiology Office Note:    Date:  06/02/2018   ID:  Crystal [name], DOB Dec 31, 1950, MRN [id number]  PCP:  [name], MD  Cardiologist:  Shirlee More, MD   Referring MD: Mackie Pai, PA-C  ASSESSMENT:    1. Essential hypertension   2. Chest pain in adult   3. Other chest pain   4. Shortness of breath   5. Chest pain, unspecified type    PLAN:    In order of problems listed above:  1. Her chest pain is quite concerning for pleuritic and in the setting of risk factor for venous thromboembolism pulmonary embolism.  She will undergo quick evaluation including a CTA pulmonary embolism protocol as well as laboratory work including sedimentation rate C-reactive protein and CBC regarding pleuritis.  Be scheduled for a pharmacologic perfusion study regarding CAD 2. See above 3. Concerning for venous thromboembolism  4. Stable hypertension continue current treatment   Next appointment four weeks   Medication Adjustments/Labs and Tests Ordered: Current medicines are reviewed at length with the patient today.  Concerns regarding medicines are outlined above.  Orders Placed This Encounter  Procedures  . CT ANGIO CHEST PE W OR WO CONTRAST  . Sedimentation rate  . C-reactive protein  . CBC  . Basic Metabolic Panel (BMET)  . MYOCARDIAL PERFUSION IMAGING  . EKG 12-Lead   No orders of the defined types were placed in this encounter.    Chief Complaint  Patient presents with  . Chest Pain  . Shortness of Breath    History of Present Illness:    Crystal [name] is a 67 y.o. female who is being seen today for the evaluation of chest pain with recent ED evaluation at the request of [place], Percell Miller, [place]. Seen in the emergency room 05/17/2018 with 2 undetectable troponin levels, I independently reviewed the EKG with minor nonspecific ST and T abnormality.  Her chest x-ray was also normal  The day she presented to the hospital with chest pain she had the abrupt  onset of back discomfort on the left radiates through the chest.  She described as a sharp pleuritic worse with a deep breath and particularly severe with a cough.  She was quite short of breath with this she has a history of having fractured her ankle in September and was immobilized in cast for 8 weeks.  Is no known history of venous thromboembolism.  Since that time she has had no chest pain and although she has a chronic cough shortness of breath and wheezing the symptoms were not different the day of her chest discomfort except for worsening shortness of breath in the last few weeks with more than usual activity.  The symptoms were not anginal pressure unassociated with activity unrelieved with rest no diaphoresis or nausea.  Did not have evaluation for venous thromboembolism Past Medical History:  Diagnosis Date  . Acute peptic ulcer of stomach    As a teenager  . Allergy    dust, cock roach,grass,weeds,molds  . Anxiety   . Arthritis    knees, hands, shoulders  . Asthma    02/08/10 FEV1 1.98 (80%), s/p saba 2.22 l/m (90%).nml  . Broken ankle    right foot, wearing a boot  . Cognitive change 06/10/2016  . Dementia ([place])   . Depression   . Diabetes mellitus 2003   Type II  . Diverticulosis   . Dyspepsia   . Fatty liver 12/19/2010  . GERD (gastroesophageal reflux disease)   .  Hearing loss    bilateral hearing aids   . Herpes simplex virus (HSV) infection   . High cholesterol   . Hyperlipemia   . Hypertension   . Hypothyroidism 09/08/2013  . IBS (irritable bowel syndrome)   . Insomnia   . Internal hemorrhoids   . Joint pain 08/08/2016  . Low back pain 08/10/2016  . OSA (obstructive sleep apnea)    Does not use CPAP - old one broken, new sleep study to be scheduled and then given a new CPAP machine  . Plantar fasciitis    no longer an issue  . Sun-damaged skin 12/09/2016  . SVD (spontaneous vaginal delivery)    x 4  . Vaginal Pap smear, abnormal   . Vitamin D deficiency 03/04/2016   . Wears partial dentures    upper dentures and lower partial    Past Surgical History:  Procedure Laterality Date  . APPENDECTOMY     age 1  . CATARACT EXTRACTION Bilateral 2016   Dr. Bing Plume  . CATARACT EXTRACTION, BILATERAL     12/8 and 12/14  . COLONOSCOPY  06/03/2010, 08/25/2011    severeal - polyps and diverticulosis   . DILATATION & CURETTAGE/HYSTEROSCOPY WITH MYOSURE N/A 04/01/2018   Procedure: DILATATION & CURETTAGE/HYSTEROSCOPY WITH MYOSURE POLYPECTOMY;  Surgeon: Emily Filbert, MD;  Location: [facility];  Service: Gynecology;  Laterality: N/A;  . EYE SURGERY     cateracts removed bilateral  . GASTRECTOMY     partial - ulcer as a teen  . HAND SURGERY Right    thumb   . HEMORRHOID BANDING    . KNEE ARTHROSCOPY Left 1998  . NASAL SEPTUM SURGERY    . RHINOPLASTY    . TOE SURGERY Right 1995    Current Medications: Current Meds  Medication Sig  . ACCU-CHEK SOFTCLIX LANCETS lancets Use as directed twice daily to check blood sugar. DX E11.9  . albuterol (VENTOLIN HFA) 108 (90 Base) MCG/ACT inhaler INHALE 2 PUFFS BY MOUTH INTO THE LUNGS EVERY 4 (FOUR) HOURS AS NEEDED FOR SHORTNESS OF BREATH AND WHEEZING  . Alcohol Swabs (B-D SINGLE USE SWABS REGULAR) PADS Use as directed to check blood sugar. DX E11.9  . Apoaequorin (PREVAGEN EXTRA STRENGTH PO) Take 1 capsule by mouth daily.  Marland Kitchen atenolol (TENORMIN) 25 MG tablet Take 1 tablet (25 mg total) by mouth daily.  Marland Kitchen atorvastatin (LIPITOR) 80 MG tablet TAKE 1 TABLET EVERY DAY (Patient taking differently: Take 80 mg by mouth at bedtime. )  . Blood Glucose Calibration (ACCU-CHEK AVIVA) SOLN Use as directed to check blood sugar.  DX E11.9  . Blood Glucose Monitoring Suppl (ACCU-CHEK AVIVA PLUS) w/Device KIT USE AS DIRECTED TO CHECK BLOOD SUGAR.  . cetirizine (ZYRTEC) 10 MG tablet Take 1 tablet (10 mg total) by mouth daily as needed for allergies.  . Cholecalciferol (VITAMIN D3) 2000 units TABS Take 1 tablet by mouth daily.  Marland Kitchen dicyclomine  (BENTYL) 10 MG capsule TAKE 1 CAPSULE EVERY 4-6 HOURS AS NEEDED FOR ABDOMINAL PAIN AND CRAMPING OR DIARRHEA (Patient taking differently: Take 10 mg by mouth See admin instructions. TAKE 1 CAPSULE EVERY 4-6 HOURS AS NEEDED FOR ABDOMINAL PAIN AND CRAMPING OR DIARRHEA)  . donepezil (ARICEPT) 10 MG tablet Take 1 tablet (10 mg total) by mouth at bedtime.  Marland Kitchen escitalopram (LEXAPRO) 20 MG tablet Take 1 tablet (20 mg total) by mouth daily.  . fenofibrate 160 MG tablet TAKE 1 TABLET (160 MG TOTAL) BY MOUTH DAILY.  . fluticasone (FLONASE) 50 MCG/ACT  nasal spray Place 2 sprays into both nostrils daily.  Marland Kitchen glimepiride (AMARYL) 4 MG tablet Take 1 tablet (4 mg total) by mouth 2 (two) times daily. (Patient taking differently: Take 4 mg by mouth daily with breakfast. )  . glucose blood (ACCU-CHEK AVIVA PLUS) test strip Use as directed twice daily to check blood sugar.  DX E11.9  . ibuprofen (ADVIL,MOTRIN) 600 MG tablet Take 1 tablet (600 mg total) by mouth every 6 (six) hours as needed.  Marland Kitchen ketorolac (ACULAR) 0.5 % ophthalmic solution Place 1 drop into the right eye 4 (four) times daily. (Patient taking differently: Place 1 drop into both eyes 4 (four) times daily. )  . levothyroxine (SYNTHROID, LEVOTHROID) 25 MCG tablet TAKE 1 TABLET (25 MCG TOTAL) BY MOUTH DAILY BEFORE BREAKFAST.  Marland Kitchen losartan (COZAAR) 50 MG tablet TAKE 1 TABLET EVERY DAY (Patient taking differently: Take 50 mg by mouth daily. )  . meloxicam (MOBIC) 7.5 MG tablet Take 1 tablet (7.5 mg total) by mouth daily.  . montelukast (SINGULAIR) 10 MG tablet Take 1 tablet (10 mg total) by mouth at bedtime as needed (allergies).  . Multiple Vitamins-Minerals (CENTRUM SILVER PO) Take 1 tablet by mouth daily.    . pantoprazole (PROTONIX) 40 MG tablet TAKE 1 TABLET EVERY DAY (Patient taking differently: Take 40 mg by mouth daily. )  . Probiotic Product (ALIGN) 4 MG CAPS Take 1 capsule by mouth daily. Reported on 07/02/2015  . sitaGLIPtin (JANUVIA) 100 MG tablet Take  1 tablet (100 mg total) by mouth daily.  Marland Kitchen triamcinolone cream (KENALOG) 0.1 % Apply 1 application topically 2 (two) times daily.      Allergies:   Compazine [prochlorperazine edisylate]; Sulfa antibiotics; and Tetanus toxoid   Social History   Socioeconomic History  . Marital status: Widowed    Spouse name: Not on file  . Number of children: 4  . Years of education: Not on file  . Highest education level: Not on file  Occupational History  . Occupation: retired    Fish farm manager: [employer]  Social Needs  . Financial resource strain: Not on file  . Food insecurity:    Worry: Not on file    Inability: Not on file  . Transportation needs:    Medical: Not on file    Non-medical: Not on file  Tobacco Use  . Smoking status: Never Smoker  . Smokeless tobacco: Never Used  Substance and Sexual Activity  . Alcohol use: Yes    Alcohol/week: 0.0 standard drinks    Comment: Rare occasions   . Drug use: No  . Sexual activity: Not Currently    Birth control/protection: Post-menopausal  Lifestyle  . Physical activity:    Days per week: Not on file    Minutes per session: Not on file  . Stress: Not on file  Relationships  . Social connections:    Talks on phone: Not on file    Gets together: Not on file    Attends religious service: Not on file    Active member of club or organization: Not on file    Attends meetings of clubs or organizations: Not on file    Relationship status: Not on file  Other Topics Concern  . Not on file  Social History Narrative   No caffeine drinks daily      Family History: The patient's family history includes Asthma in her maternal grandmother; Cancer in her brother, sister, and sister; Colon cancer in her sister; Colon polyps in her sister; Diabetes  in her mother; Heart attack (age of onset: 20) in her mother; Heart defect in her unknown relative; Heart disease in her sister; Hyperlipidemia in her mother; Mental illness in her son; Other in her unknown  relative; Parkinsonism in her father; Stroke in her mother; Ulcers in her unknown relative. There is no history of Amblyopia, Blindness, Cataracts, Glaucoma, Macular degeneration, Retinal detachment, Strabismus, or Retinitis pigmentosa.  ROS:   Review of Systems  Constitution: Positive for diaphoresis and malaise/fatigue.  HENT: Negative.   Eyes: Negative.   Cardiovascular: Positive for chest pain and dyspnea on exertion.  Respiratory: Positive for cough, shortness of breath and wheezing.   Endocrine: Negative.   Hematologic/Lymphatic: Negative.   Skin: Negative.   Musculoskeletal: Negative.   Gastrointestinal: Positive for diarrhea.  Genitourinary: Negative.   Neurological: Positive for disturbances in coordination and dizziness.  Psychiatric/Behavioral: The patient is nervous/anxious.   Allergic/Immunologic: Negative.    Please see the history of present illness.     All other systems reviewed and are negative.  EKGs/Labs/Other Studies Reviewed:    The following studies were reviewed today:   EKG:  EKG is  ordered today.  The ekg ordered today demonstrates sinus rhythm and is normal  Recent Labs: 02/23/2018: ALT 20; TSH 4.79 05/17/2018: BUN 17; Creatinine, Ser 0.76; Hemoglobin 12.0; Platelets 238; Potassium 3.5; Sodium 141  Recent Lipid Panel    Component Value Date/Time   CHOL 142 02/23/2018 1512   TRIG 124.0 02/23/2018 1512   HDL 51.70 02/23/2018 1512   CHOLHDL 3 02/23/2018 1512   VLDL 24.8 02/23/2018 1512   LDLCALC 66 02/23/2018 1512   LDLDIRECT 123.0 09/18/2014 1344    Physical Exam:    VS:  BP 136/84 (BP Location: Left Arm, Patient Position: Sitting, Cuff Size: Normal)   Pulse 70   Ht 5' 2.5" (1.588 m)   Wt 163 lb 12.8 oz (74.3 kg)   LMP  (LMP Unknown)   SpO2 95%   BMI 29.48 kg/m     Wt Readings from Last 3 Encounters:  06/02/18 163 lb 12.8 oz (74.3 kg)  05/31/18 158 lb (71.7 kg)  05/19/18 166 lb (75.3 kg)     GEN:  Well nourished, well developed in no  acute distress HEENT: Normal NECK: No JVD; No carotid bruits LYMPHATICS: No lymphadenopathy CARDIAC: RRR, no murmurs, rubs, gallops RESPIRATORY:  Clear to auscultation without rales, wheezing or rhonchi  ABDOMEN: Soft, non-tender, non-distended MUSCULOSKELETAL:  No edema; No deformity  SKIN: Warm and dry NEUROLOGIC:  Alert and oriented x 3 PSYCHIATRIC:  Normal affect     Signed, Shirlee More, MD  06/02/2018 12:36 PM    [place]

## 2018-06-02 ENCOUNTER — Ambulatory Visit (HOSPITAL_BASED_OUTPATIENT_CLINIC_OR_DEPARTMENT_OTHER)
Admission: RE | Admit: 2018-06-02 | Discharge: 2018-06-02 | Disposition: A | Discharge status: Home / Self Care | Payer: Medicare HMO | Source: Ambulatory Visit | Attending: Cardiology | Admitting: Cardiology

## 2018-06-02 ENCOUNTER — Ambulatory Visit (INDEPENDENT_AMBULATORY_CARE_PROVIDER_SITE_OTHER): Payer: Medicare HMO | Admitting: Cardiology

## 2018-06-02 ENCOUNTER — Encounter: Payer: Self-pay | Admitting: *Deleted

## 2018-06-02 ENCOUNTER — Encounter: Payer: Self-pay | Admitting: Cardiology

## 2018-06-02 ENCOUNTER — Encounter (HOSPITAL_BASED_OUTPATIENT_CLINIC_OR_DEPARTMENT_OTHER): Payer: Self-pay

## 2018-06-02 VITALS — BP 136/84 | HR 70 | Ht 62.5 in | Wt 163.8 lb

## 2018-06-02 DIAGNOSIS — R079 Chest pain, unspecified: Secondary | ICD-10-CM | POA: Diagnosis not present

## 2018-06-02 DIAGNOSIS — I700 Atherosclerosis of aorta: Secondary | ICD-10-CM | POA: Diagnosis not present

## 2018-06-02 DIAGNOSIS — R0789 Other chest pain: Secondary | ICD-10-CM

## 2018-06-02 DIAGNOSIS — R0602 Shortness of breath: Secondary | ICD-10-CM

## 2018-06-02 DIAGNOSIS — I10 Essential (primary) hypertension: Secondary | ICD-10-CM | POA: Diagnosis not present

## 2018-06-02 MED ORDER — IOPAMIDOL (ISOVUE-370) INJECTION 76%
100.0000 mL | Freq: Once | INTRAVENOUS | Status: AC | PRN
  Administered 2018-06-02: 100 mL via INTRAVENOUS

## 2018-06-02 NOTE — Patient Instructions (Addendum)
Medication Instructions:  Your physician recommends that you continue on your current medications as directed. Please refer to the Current Medication list given to you today.  If you need a refill on your cardiac medications before your next appointment, please call your pharmacy.   Lab work: Your physician recommends that you return for lab work today: BMP, CBC, CRP, Sedimentation rate.   If you have labs (blood work) drawn today and your tests are completely normal, you will receive your results only by: Marland Kitchen MyChart Message (if you have MyChart) OR . A paper copy in the mail If you have any lab test that is abnormal or we need to change your treatment, we will call you to review the results.  Testing/Procedures: You had an EKG today.   Your physician has requested that you have a lexiscan myoview. For further information please visit [email or web address]. Please follow instruction sheet, as given.  Non-Cardiac CT Angiography (CTA), is a special type of CT scan that uses a computer to produce multi-dimensional views of major blood vessels throughout the body. In CT angiography, a contrast material is injected through an IV to help visualize the blood vessels  Follow-Up: At [facility], you and your health needs are our priority.  As part of our continuing mission to provide you with exceptional heart care, we have created designated Provider Care Teams.  These Care Teams include your primary Cardiologist (physician) and Advanced Practice Providers (APPs -  Physician Assistants and Nurse Practitioners) who all work together to provide you with the care you need, when you need it. You will need a follow up appointment in 4 weeks.      Regadenoson injection What is this medicine? REGADENOSON is used to test the heart for coronary artery disease. It is used in patients who can not exercise for their stress test. This medicine may be used for other purposes; ask your health care provider or  pharmacist if you have questions. COMMON BRAND NAME(S): Lexiscan What should I tell my health care provider before I take this medicine? They need to know if you have any of these conditions: -heart problems -lung or breathing disease, like asthma or COPD -an unusual or allergic reaction to regadenoson, other medicines, foods, dyes, or preservatives -pregnant or trying to get pregnant -breast-feeding How should I use this medicine? This medicine is for injection into a vein. It is given by a health care professional in a hospital or clinic setting. Talk to your pediatrician regarding the use of this medicine in children. Special care may be needed. Overdosage: If you think you have taken too much of this medicine contact a poison control center or emergency room at once. NOTE: This medicine is only for you. Do not share this medicine with others. What if I miss a dose? This does not apply. What may interact with this medicine? -caffeine -dipyridamole -guarana -theophylline This list may not describe all possible interactions. Give your health care provider a list of all the medicines, herbs, non-prescription drugs, or dietary supplements you use. Also tell them if you smoke, drink alcohol, or use illegal drugs. Some items may interact with your medicine. What should I watch for while using this medicine? Your condition will be monitored carefully while you are receiving this medicine. Do not take medicines, foods, or drinks with caffeine (like coffee, tea, or colas) for at least 12 hours before your test. If you do not know if something contains caffeine, ask your health care professional. What side  effects may I notice from receiving this medicine? Side effects that you should report to your doctor or health care professional as soon as possible: -allergic reactions like skin rash, itching or hives, swelling of the face, lips, or tongue -breathing problems -chest pain, tightness or  palpitations -severe headache Side effects that usually do not require medical attention (report to your doctor or health care professional if they continue or are bothersome): -flushing -headache -irritation or pain at site where injected -nausea, vomiting This list may not describe all possible side effects. Call your doctor for medical advice about side effects. You may report side effects to FDA at 1-800-FDA-1088. Where should I keep my medicine? This drug is given in a hospital or clinic and will not be stored at home. NOTE: This sheet is a summary. It may not cover all possible information. If you have questions about this medicine, talk to your doctor, pharmacist, or health care provider.  2018 Elsevier/Gold Standard (2008-02-28 15:08:13)    CT Angiogram A CT angiogram is a procedure to look at the blood vessels in various areas of the body. For this procedure, a large X-ray machine, called a CT scanner, takes detailed pictures of blood vessels that have been injected with a dye (contrast material). A CT angiogram allows your health care provider to see how well blood is flowing to the area of your body that is being checked. Your health care provider will be able to see if there are any problems, such as a blockage. Tell a health care provider about:  Any allergies you have.  All medicines you are taking, including vitamins, herbs, eye drops, creams, and over-the-counter medicines.  Any problems you or family members have had with anesthetic medicines.  Any blood disorders you have.  Any surgeries you have had.  Any medical conditions you have.  Whether you are pregnant or may be pregnant.  Whether you are breastfeeding.  Any anxiety disorders, chronic pain, or other conditions you have that may increase your stress or prevent you from lying still. What are the risks? Generally, this is a safe procedure. However, problems may occur,  including:  Infection.  Bleeding.  Allergic reactions to medicines or dyes.  Damage to other structures or organs.  Kidney damage from the dye or contrast that is used.  Increased risk of cancer from radiation exposure. This risk is low. Talk with your health care provider about: ? The risks and benefits of testing. ? How you can receive the lowest dose of radiation.  What happens before the procedure?  Wear comfortable clothing and remove any jewelry.  Follow instructions from your health care provider about eating and drinking. For most people, instructions may include these actions: ? For 12 hours before the test, avoid caffeine. This includes tea, coffee, soda, and energy drinks or pills. ? For 3-4 hours before the test, stop eating or drinking anything but water. ? Stay well hydrated by continuing to drink water before the exam. This will help to clear the contrast dye from your body after the test.  Ask your health care provider about changing or stopping your regular medicines. This is especially important if you are taking diabetes medicines or blood thinners. What happens during the procedure?  An IV tube will be inserted into one of your veins.  You will be asked to lie on an exam table. This table will slide in and out of the CT machine during the procedure.  Contrast dye will be injected  into the IV tube. You might feel warm, or you may get a metallic taste in your mouth.  The table that you are lying on will move into the CT machine tunnel for the scan.  The person running the machine will give you instructions while the scans are being done. You may be asked to: ? Keep your arms above your head. ? Hold your breath. ? Stay very still, even if the table is moving.  When the scanning is complete, you will be moved out of the machine.  The IV tube will be removed. The procedure may vary among health care providers and hospitals. What happens after the  procedure?  You might feel warm, or you may get a metallic taste in your mouth.  You may be asked to drink water or other fluids to wash (flush) the contrast material out of your body.  It is up to you to get the results of your procedure. Ask your health care provider, or the department that is doing the procedure, when your results will be ready. Summary  A CT angiogram is a procedure to look at the blood vessels in various areas of the body.  You will need to stay very still during the exam.  You may be asked to drink water or other fluids to wash (flush) the contrast material out of your body after your scan. This information is not intended to replace advice given to you by your health care provider. Make sure you discuss any questions you have with your health care provider. Document Released: 02/28/2016 Document Revised: 02/28/2016 Document Reviewed: 02/28/2016 Elsevier Interactive Patient Education  2018 [place].    Erythrocyte Sedimentation Rate Why am I having this test? The erythrocyte sedimentation rate (ESR) test is used to find illnesses associated with:  Acute and chronic infection.  Inflammation.  Noncancerous abnormal tissue growth.  Tissue death.  This test measures how long it takes for your red blood cells to settle in a saline or plasma solution over a specified amount of time. If you have vague symptoms associated with these illnesses, your health care provider may do this test before doing more specific tests. This test can also be used to help monitor therapy if you have an inflammatory immune disease, such as rheumatoid arthritis. What kind of sample is taken? A blood sample is required for this test. It is usually collected by inserting a needle into a vein. How do I prepare for this test? There is no preparation required for this test. Your health care provider will tell you if you need to stop any of your medicines before the test. Some medicines  can affect the test results. What are the reference values? Reference values are considered healthy values established after testing a large group of healthy people. Reference values may vary among different people, labs, and hospitals. It is your responsibility to obtain your test results. Ask the lab or department performing the test when and how you will get your results. Reference values using the Westergren method are the following:  Female: up to 15 mm/hr.  Female:up to 20 mm/hr.  Child: up to 10 mm/hr.  Newborn: 0-2 mm/hr.  The ESR results depend on which test the lab uses. What do the results mean? Increased ESR levels may indicate:  Kidney failure.  Certain types of cancer.  Inflammatory diseases.  Tissue death or damage.  Severe anemia.  Certain diseases may cause ESR levels to be falsely decreased. Talk with your  health care provider to discuss your results, treatment options, and if necessary, the need for more tests. Talk with your health care provider if you have any questions about your results. Talk with your health care provider to discuss your results, treatment options, and if necessary, the need for more tests. Talk with your health care provider if you have any questions about your results. This information is not intended to replace advice given to you by your health care provider. Make sure you discuss any questions you have with your health care provider. Document Released: 07/23/2004 Document Revised: 03/04/2016 Document Reviewed: 11/25/2013 Elsevier Interactive Patient Education  2018 [place].    C-Reactive Protein Test C-reactive protein (CRP) is a substance released by the liver in response to inflammation. The level of CRP in your body increases greatly after a heart attack, an infection, or an injury. High levels of CRP in your body can indicate an infection. This can help your health care provider determine whether you have a serious bacterial  or fungal infection. Elevated levels of CRP can also help health care providers identify conditions that can be difficult to diagnose. These include:  Cancer.  Arthritis.  Inflammatory bowel disease.  Lupus or other autoimmune diseases.  A high-sensitivity C-reactive protein (hs-CRP) assay is a more sensitive test. It is used to measure your risk for heart disease. The test for CRP is a blood test. It requires a blood sample from a vein in your arm. What do the results mean? It is your responsibility to obtain your test results. Ask the lab or department performing the test when and how you will get your results. Contact your health care provider to discuss any questions you have about your results. The results of a CRP test will be a range of values. CRP levels that are above the normal range may indicate a problem. Range of Normal Values Ranges for normal values vary among different labs and hospitals. You should always check with your health care provider after having lab work or other tests done to discuss whether your values are considered within normal limits. For a standard CRP test, the normal value is zero. The standard CRP test usually measures levels of CRP from 10 to 1,000 milligrams per liter (mg/L). The hs-CRP can detect CRP levels between 0.5 and 10 mg/L.  A level of 1.0 mg/L and lower may indicate a low risk for heart disease.  A level between 1.0 and 3.0 mg/L may represent an average risk for heart disease.  A level of 3.0 mg/L or higher may indicate a high risk for heart disease.  Meaning of Results Outside Normal Range Values A high CRP level only indicates that there is inflammation. This can be caused by many different conditions, includinginfection, arthritis, and autoimmune diseases. It can also be a marker for cancer, a heart attack, or internal injury. Health care providers also use other tests and consider your signs and symptoms when diagnosing your  condition. Talk with your health care provider to discuss your results, treatment options, and if necessary, the need for more tests. Talk with your health care provider if you have any questions about your results. This information is not intended to replace advice given to you by your health care provider. Make sure you discuss any questions you have with your health care provider. Document Released: 07/23/2004 Document Revised: 03/04/2016 Document Reviewed: 09/27/2013 Elsevier Interactive Patient Education  [employer].

## 2018-06-03 LAB — BASIC METABOLIC PANEL
BUN/Creatinine Ratio: 16 (ref 12–28)
BUN: 13 mg/dL (ref 8–27)
CO2: 25 mmol/L (ref 20–29)
Calcium: 9.9 mg/dL (ref 8.7–10.3)
Chloride: 106 mmol/L (ref 96–106)
Creatinine, Ser: 0.81 mg/dL (ref 0.57–1.00)
GFR calc Af Amer: 86 mL/min/{1.73_m2} (ref >59)
GFR, EST NON AFRICAN AMERICAN: 74 mL/min/{1.73_m2} (ref >59)
GLUCOSE: 95 mg/dL (ref 65–99)
POTASSIUM: 4.2 mmol/L (ref 3.5–5.2)
SODIUM: 145 mmol/L — AB (ref 134–144)

## 2018-06-03 LAB — CBC
Hematocrit: 38 % (ref 34.0–46.6)
Hemoglobin: 12.8 g/dL (ref 11.1–15.9)
MCH: 30.7 pg (ref 26.6–33.0)
MCHC: 33.7 g/dL (ref 31.5–35.7)
MCV: 91 fL (ref 79–97)
PLATELETS: 332 10*3/uL (ref 150–450)
RBC: 4.17 x10E6/uL (ref 3.77–5.28)
RDW: 12.1 % — AB (ref 12.3–15.4)
WBC: 6.2 10*3/uL (ref 3.4–10.8)

## 2018-06-03 LAB — C-REACTIVE PROTEIN: CRP: 1 mg/L (ref 0–10)

## 2018-06-03 LAB — SEDIMENTATION RATE: SED RATE: 9 mm/h (ref 0–40)

## 2018-06-04 ENCOUNTER — Telehealth: Payer: Self-pay | Admitting: Internal Medicine

## 2018-06-04 NOTE — Telephone Encounter (Signed)
Patient reports that she is having diarrhea.  All of her previous meds and OTC products are not working.  She will come in on 06/08/18 1:30 to see Tye Savoy RNP

## 2018-06-04 NOTE — Telephone Encounter (Signed)
Left message for patient to call back  

## 2018-06-04 NOTE — Telephone Encounter (Signed)
Left message to call back  

## 2018-06-04 NOTE — Telephone Encounter (Signed)
Patient states she is returning nurses call.

## 2018-06-07 ENCOUNTER — Other Ambulatory Visit: Payer: Self-pay | Admitting: Family Medicine

## 2018-06-08 ENCOUNTER — Ambulatory Visit: Payer: Self-pay | Admitting: Nurse Practitioner

## 2018-06-14 ENCOUNTER — Telehealth (HOSPITAL_COMMUNITY): Payer: Self-pay

## 2018-06-14 NOTE — Telephone Encounter (Signed)
Detailed message left on the pt's am. Asked to call back with any questions. S.Silas Muff EMTP

## 2018-06-16 MED FILL — DICYCLOMINE 10 MG CAPSULE: 10 | 10 days supply | Qty: 60 | Fill #0

## 2018-06-17 ENCOUNTER — Ambulatory Visit (HOSPITAL_COMMUNITY): Payer: Medicare HMO | Attending: Cardiology

## 2018-06-17 VITALS — Ht 62.5 in | Wt 163.0 lb

## 2018-06-17 DIAGNOSIS — R079 Chest pain, unspecified: Secondary | ICD-10-CM | POA: Insufficient documentation

## 2018-06-17 LAB — MYOCARDIAL PERFUSION IMAGING
CHL CUP NUCLEAR SSS: 1
LVDIAVOL: 67 mL (ref 46–106)
LVSYSVOL: 24 mL
Peak HR: 108 {beats}/min
Rest HR: 62 {beats}/min
SDS: 1
SRS: 0
TID: 1.02

## 2018-06-17 MED ORDER — TECHNETIUM TC 99M TETROFOSMIN IV KIT
10.1000 | PACK | Freq: Once | INTRAVENOUS | Status: AC | PRN
  Administered 2018-06-17: 10.1 via INTRAVENOUS
  Filled 2018-06-17: qty 11

## 2018-06-17 MED ORDER — REGADENOSON 0.4 MG/5ML IV SOLN
0.4000 mg | Freq: Once | INTRAVENOUS | Status: AC
  Administered 2018-06-17: 0.4 mg via INTRAVENOUS

## 2018-06-17 MED ORDER — TECHNETIUM TC 99M TETROFOSMIN IV KIT
31.2000 | PACK | Freq: Once | INTRAVENOUS | Status: AC | PRN
  Administered 2018-06-17: 31.2 via INTRAVENOUS
  Filled 2018-06-17: qty 32

## 2018-06-21 ENCOUNTER — Encounter (INDEPENDENT_AMBULATORY_CARE_PROVIDER_SITE_OTHER): Payer: Medicare HMO | Admitting: Ophthalmology

## 2018-06-22 ENCOUNTER — Ambulatory Visit: Payer: Medicare HMO | Admitting: Internal Medicine

## 2018-06-22 ENCOUNTER — Encounter: Payer: Self-pay | Admitting: Internal Medicine

## 2018-06-22 ENCOUNTER — Encounter: Payer: Self-pay | Admitting: Nurse Practitioner

## 2018-06-22 ENCOUNTER — Ambulatory Visit: Payer: Medicare HMO | Admitting: Nurse Practitioner

## 2018-06-22 ENCOUNTER — Encounter

## 2018-06-22 VITALS — BP 140/80 | HR 76 | Ht 62.5 in | Wt 164.0 lb

## 2018-06-22 VITALS — BP 152/76 | HR 76 | Ht 62.5 in | Wt 164.0 lb

## 2018-06-22 DIAGNOSIS — G63 Polyneuropathy in diseases classified elsewhere: Secondary | ICD-10-CM | POA: Diagnosis not present

## 2018-06-22 DIAGNOSIS — E1149 Type 2 diabetes mellitus with other diabetic neurological complication: Secondary | ICD-10-CM

## 2018-06-22 DIAGNOSIS — E785 Hyperlipidemia, unspecified: Secondary | ICD-10-CM

## 2018-06-22 DIAGNOSIS — K580 Irritable bowel syndrome with diarrhea: Secondary | ICD-10-CM | POA: Diagnosis not present

## 2018-06-22 LAB — POCT GLYCOSYLATED HEMOGLOBIN (HGB A1C): Hemoglobin A1C: 6.5 % — AB (ref 4.0–5.6)

## 2018-06-22 MED ORDER — DIPHENOXYLATE-ATROPINE 2.5-0.025 MG PO TABS: 1.0000 | ORAL_TABLET | ORAL | 3 refills | Status: AC

## 2018-06-22 MED FILL — DIPHENOXYLATE-ATROPINE 2.5-: 2.5-0.025 | 30 days supply | Qty: 30 | Fill #0

## 2018-06-22 NOTE — Progress Notes (Signed)
Patient ID: Crystal [name], female   DOB: 1950/12/04, 67 y.o.   MRN: [id number]   HPI: Crystal [name] is a 67 y.o.-year-old female, returning for follow-up for DM2, dx in 2003, non-insulin-dependent, uncontrolled, with complications (PN, DR).  Last visit 6 months ago.  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  They are living in the same apartment.  She is quite stressed.  Last hemoglobin A1c was: Lab Results  Component Value Date   HGBA1C 7.4 (H) 02/23/2018   HGBA1C 6.8 (A) 12/17/2017   HGBA1C 6.9 09/14/2017   Pt is on a regimen of: - Amaryl 4 mg before breakfast - Januvia 100 mg before breakfast She had to stop Jardiance in the past because of increased IBS symptoms and also yeast infections. Metformin gave her diarrhea in the past. Januvia was too expensive in the past Invokana >> cannot remember why stopped  Pt checks her sugars once a day: - am: 57, 105-145, 157 >> 85-115 >> 85-116 >> 105-120 - 2h after b'fast: 199 >> n/c - before lunch: n/c - 2h after lunch: 127-135 >> 120-140s >> n/c >> 57 - seldom: 1x every 2 months - before dinner: n/c - 2h after dinner: n/c - bedtime:120-140s >> 135-150 >> lower than 135 - nighttime: n/c Lowest sugar was 57 x2 if lighter meal >> 85 >> 57; she has hypoglycemia awareness in the 80s. Highest sugar was 187 (forgot med) >> 150 >> 140.  Glucometer: AccuChek Aviva  Pt's meals are: - Breakfast: bowl of cereals or bacon + egg + toast or egg sandwich or biscuit + gravy + hash browns; Still drinks OJ or milk. - Lunch: leftovers from dinner or tuna + salad dressing + tuna; box of mac and cheese + tuna; raman noodles; cheese + fruit - Dinner: may eat out (chinese)   -No CKD, last BUN/creatinine:  Lab Results  Component Value Date   BUN 13 06/02/2018   BUN 17 05/17/2018   CREATININE 0.81 06/02/2018   CREATININE 0.76 05/17/2018  On losartan 50. -+ HL; last set of lipids: Lab Results  Component Value Date    CHOL 142 02/23/2018   HDL 51.70 02/23/2018   LDLCALC 66 02/23/2018   LDLDIRECT 123.0 09/18/2014   TRIG 124.0 02/23/2018   CHOLHDL 3 02/23/2018  On Lipitor 80, fenofibrate 160. - last eye exam was in 09/2017: + DR. Sees retina specialist: Dr. Coralyn Pear. -No numbness and tingling in her feet.  Numbness in fingertips resolved. Sees podiatry - Dr. Jacqualyn Posey.  Pt also has mild cognitive impairment.  ROS: Constitutional: + Weight gain/no weight loss, no fatigue, no subjective hyperthermia, no subjective hypothermia Eyes: no blurry vision, no xerophthalmia ENT: no sore throat, no nodules palpated in neck, no dysphagia, no odynophagia, no hoarseness Cardiovascular: no CP/no SOB/no palpitations/no leg swelling Respiratory: no cough/no SOB/no wheezing Gastrointestinal: no N/no V/no D/no C/no acid reflux Musculoskeletal: no muscle aches/no joint aches Skin: no rashes, no hair loss Neurological: no tremors/no numbness/no tingling/no dizziness  I reviewed pt's medications, allergies, PMH, social hx, family hx, and changes were documented in the history of present illness. Otherwise, unchanged from my initial visit note.  Past Medical History:  Diagnosis Date  . Acute peptic ulcer of stomach    As a teenager  . Allergy    dust, cock roach,grass,weeds,molds  . Anxiety   . Arthritis    knees, hands, shoulders  . Asthma    02/08/10 FEV1 1.98 (80%), s/p saba  2.22 l/m (90%).nml  . Broken ankle    right foot, wearing a boot  . Cognitive change 06/10/2016  . Dementia ([place])   . Depression   . Diabetes mellitus 2003   Type II  . Diverticulosis   . Dyspepsia   . Fatty liver 12/19/2010  . GERD (gastroesophageal reflux disease)   . Hearing loss    bilateral hearing aids   . Herpes simplex virus (HSV) infection   . High cholesterol   . Hyperlipemia   . Hypertension   . Hypothyroidism 09/08/2013  . IBS (irritable bowel syndrome)   . Insomnia   . Internal hemorrhoids   . Joint pain 08/08/2016  .  Low back pain 08/10/2016  . OSA (obstructive sleep apnea)    Does not use CPAP - old one broken, new sleep study to be scheduled and then given a new CPAP machine  . Plantar fasciitis    no longer an issue  . Sun-damaged skin 12/09/2016  . SVD (spontaneous vaginal delivery)    x 4  . Vaginal Pap smear, abnormal   . Vitamin D deficiency 03/04/2016  . Wears partial dentures    upper dentures and lower partial   Past Surgical History:  Procedure Laterality Date  . APPENDECTOMY     age 13  . CATARACT EXTRACTION Bilateral 2016   Dr. Bing Plume  . CATARACT EXTRACTION, BILATERAL     12/8 and 12/14  . COLONOSCOPY  06/03/2010, 08/25/2011    severeal - polyps and diverticulosis   . DILATATION & CURETTAGE/HYSTEROSCOPY WITH MYOSURE N/A 04/01/2018   Procedure: DILATATION & CURETTAGE/HYSTEROSCOPY WITH MYOSURE POLYPECTOMY;  Surgeon: Emily Filbert, MD;  Location: [facility];  Service: Gynecology;  Laterality: N/A;  . EYE SURGERY     cateracts removed bilateral  . GASTRECTOMY     partial - ulcer as a teen  . HAND SURGERY Right    thumb   . HEMORRHOID BANDING    . KNEE ARTHROSCOPY Left 1998  . NASAL SEPTUM SURGERY    . RHINOPLASTY    . TOE SURGERY Right 1995   Social History   Social History  . Marital status: widowed    Spouse name: [name]  . Number of children: 4  . Years of education: N/A   Occupational History  . retired Orthoptist   Social History Main Topics  . Smoking status: Never Smoker  . Smokeless tobacco: Never Used  . Alcohol use No  . Drug use: No  . Sexual activity: No     Comment: lives with her mother, no dietary restrictions   Other Topics Concern  . Not on file   Social History Narrative   No caffeine drinks daily    Current Outpatient Medications on File Prior to Visit  Medication Sig Dispense Refill  . ACCU-CHEK SOFTCLIX LANCETS lancets Use as directed twice daily to check blood sugar. DX E11.9 200 each 3  . albuterol (VENTOLIN HFA) 108 (90 Base) MCG/ACT inhaler INHALE  2 PUFFS BY MOUTH INTO THE LUNGS EVERY 4 (FOUR) HOURS AS NEEDED FOR SHORTNESS OF BREATH AND WHEEZING 18 g 1  . Alcohol Swabs (B-D SINGLE USE SWABS REGULAR) PADS Use as directed to check blood sugar. DX E11.9 100 each 3  . Apoaequorin (PREVAGEN EXTRA STRENGTH PO) Take 1 capsule by mouth daily.    Marland Kitchen atenolol (TENORMIN) 25 MG tablet Take 1 tablet (25 mg total) by mouth daily. 90 tablet 3  . atorvastatin (LIPITOR) 80 MG tablet TAKE 1 TABLET EVERY  DAY 90 tablet 0  . Blood Glucose Calibration (ACCU-CHEK AVIVA) SOLN Use as directed to check blood sugar.  DX E11.9 1 each 3  . Blood Glucose Monitoring Suppl (ACCU-CHEK AVIVA PLUS) w/Device KIT USE AS DIRECTED TO CHECK BLOOD SUGAR. 1 kit 0  . cetirizine (ZYRTEC) 10 MG tablet Take 1 tablet (10 mg total) by mouth daily as needed for allergies. 30 tablet 11  . Cholecalciferol (VITAMIN D3) 2000 units TABS Take 1 tablet by mouth daily.    Marland Kitchen dicyclomine (BENTYL) 10 MG capsule TAKE 1 CAPSULE BY MOUTH EVERY 4 TO 6 HOURS AS NEEDED FOR ABDOMINAL PAIN AND CRAMPING OR DIARRHEA 60 capsule 1  . donepezil (ARICEPT) 10 MG tablet Take 1 tablet (10 mg total) by mouth at bedtime. 30 tablet 0  . escitalopram (LEXAPRO) 20 MG tablet Take 1 tablet (20 mg total) by mouth daily. 30 tablet 0  . fenofibrate 160 MG tablet TAKE 1 TABLET (160 MG TOTAL) BY MOUTH DAILY. 90 tablet 2  . fluticasone (FLONASE) 50 MCG/ACT nasal spray Place 2 sprays into both nostrils daily. 16 g 3  . glimepiride (AMARYL) 4 MG tablet Take 1 tablet (4 mg total) by mouth 2 (two) times daily. (Patient taking differently: Take 4 mg by mouth daily with breakfast. ) 180 tablet 1  . glucose blood (ACCU-CHEK AVIVA PLUS) test strip Use as directed twice daily to check blood sugar.  DX E11.9 200 each 3  . ibuprofen (ADVIL,MOTRIN) 600 MG tablet Take 1 tablet (600 mg total) by mouth every 6 (six) hours as needed. 30 tablet 1  . ketorolac (ACULAR) 0.5 % ophthalmic solution Place 1 drop into the right eye 4 (four) times daily.  (Patient taking differently: Place 1 drop into both eyes 4 (four) times daily. ) 10 mL 0  . levothyroxine (SYNTHROID, LEVOTHROID) 25 MCG tablet TAKE 1 TABLET DAILY BEFORE BREAKFAST. 90 tablet 0  . losartan (COZAAR) 50 MG tablet TAKE 1 TABLET EVERY DAY 90 tablet 0  . meloxicam (MOBIC) 7.5 MG tablet Take 1 tablet (7.5 mg total) by mouth daily. 30 tablet 0  . montelukast (SINGULAIR) 10 MG tablet TAKE 1 TABLET AT BEDTIME AS NEEDED FOR ALLERGIES 30 tablet 0  . Multiple Vitamins-Minerals (CENTRUM SILVER PO) Take 1 tablet by mouth daily.      . pantoprazole (PROTONIX) 40 MG tablet TAKE 1 TABLET EVERY DAY 90 tablet 0  . prednisoLONE acetate (PRED FORTE) 1 % ophthalmic suspension Place 1 drop into the right eye 4 (four) times daily. 10 mL 0  . Probiotic Product (ALIGN) 4 MG CAPS Take 1 capsule by mouth daily. Reported on 07/02/2015    . sitaGLIPtin (JANUVIA) 100 MG tablet Take 1 tablet (100 mg total) by mouth daily. 90 tablet 1  . triamcinolone cream (KENALOG) 0.1 % Apply 1 application topically 2 (two) times daily.   0   No current facility-administered medications on file prior to visit.    Allergies  Allergen Reactions  . Compazine [Prochlorperazine Edisylate]     Comma for 3 days  . Sulfa Antibiotics Itching    severe itching  . Tetanus Toxoid Other (See Comments)    convulsions   Family History  Problem Relation Age of Onset  . Diabetes Mother        type II  . Heart attack Mother 2  . Stroke Mother   . Hyperlipidemia Mother   . Colon cancer Sister   . Cancer Sister        GI  .  Heart disease Sister   . Asthma Maternal Grandmother   . Mental illness Son        PTSD from [employer]  . Parkinsonism Father   . Cancer Sister        stage 4 bladder cancer  . Colon polyps Sister   . Ulcers Unknown        bleeding gastric  . Heart defect Unknown        child  . Other Unknown        perforated bowel, child  . Cancer Brother        pancreatic cancer  . Amblyopia Neg Hx   .  Blindness Neg Hx   . Cataracts Neg Hx   . Glaucoma Neg Hx   . Macular degeneration Neg Hx   . Retinal detachment Neg Hx   . Strabismus Neg Hx   . Retinitis pigmentosa Neg Hx    Pt has FH of DM in mother.  PE: BP 140/80   Pulse 76   Ht 5' 2.5" (1.588 m) Comment: measured  Wt 164 lb (74.4 kg)   LMP  (LMP Unknown)   SpO2 97%   BMI 29.52 kg/m  Wt Readings from Last 3 Encounters:  06/22/18 164 lb (74.4 kg)  06/22/18 164 lb (74.4 kg)  06/17/18 163 lb (73.9 kg)   Constitutional: overweight, in NAD Eyes: + Right surgical pupil, EOMI, no exophthalmos ENT: moist mucous membranes, no thyromegaly, no cervical lymphadenopathy Cardiovascular: RRR, No MRG Respiratory: CTA B Gastrointestinal: abdomen soft, NT, ND, BS+ Musculoskeletal: no deformities, strength intact in all 4 Skin: moist, warm, no rashes Neurological: no tremor with outstretched hands, DTR normal in all 4   ASSESSMENT: 1. DM2, non-insulin-dependent, uncontrolled, with complications - PN - DR  She saw nutrition in the past: "I know what I need to do, but I just cannot do it"  We are limited in our options for treatment by the cost of the medicines.  At the previous visit, we called her pharmacy with patient in the room and almost all the medicines that we could use were more than $100 per month month for her...  2. HL  3.  Numbness in fingers  PLAN:  1. Patient with long-acting, uncontrolled, type 2 diabetes, on oral antidiabetic regimen with sulfonylurea and DPP 4 inhibitor.  She improved her diet before last visit and we were able to stop Jardiance (due to side effects) without increasing blood sugars.  At last visit, almost all of her sugars were at goal except for some lows before lunch.  This happened if she had a lighter breakfast or was more active during the morning.  I advised her to decrease the Amaryl dose in the situations.  She did not do so, she did continue with the 4 mg daily. -At last visit, HbA1c was  at goal, however, she had another HbA1c since then, in 02/2018 and this was higher, at 7.4% -At this visit, sugars are at goal at all times of the day, with the exception of lunchtime when she may have low blood sugars.  She has sugars in the 50s approximately every 2 months, but she feels that her sugars are dropping more frequently.  She does not always get to check them.  Therefore, I advised her to stop glimepiride for now, but we may restart at a lower dose if her sugars increased afterwards. - I advised her to: Patient Instructions  Please continue: - Januvia 100 mg before b'fast  Please  stop Glimepiride, however, if sugars increase midday, please restart it at 2 mg before b'fast.  Please return in 4 months with your sugar log.   - today, HbA1c is 6.5% (lower) - continue checking sugars at different times of the day - check 1x a day, rotating checks - advised for yearly eye exams >> she is UTD - Up-to-date with flu shot - Return to clinic in 4 mo with sugar log   2. HL - Reviewed latest lipid panel from 02/2018: LDL much improved, now at goal, and the rest of the fractions also at goal Lab Results  Component Value Date   CHOL 142 02/23/2018   HDL 51.70 02/23/2018   LDLCALC 66 02/23/2018   LDLDIRECT 123.0 09/18/2014   TRIG 124.0 02/23/2018   CHOLHDL 3 02/23/2018  - Continues Lipitor 80 and fenofibrate 160 without side effects.  3.  Numbness in fingers -B12 levels were normal -I suggested alpha lipoic acid and B complex but she did not start this -Her symptoms improved after cutting back on coffee  Philemon Kingdom, MD PhD [facility]

## 2018-06-22 NOTE — Patient Instructions (Signed)
If you are age 69 or older, your body mass index should be between 23-30. Your Body mass index is 29.52 kg/m. If this is out of the aforementioned range listed, please consider follow up with your Primary Care Provider.  If you are age 29 or younger, your body mass index should be between 19-25. Your Body mass index is 29.52 kg/m. If this is out of the aformentioned range listed, please consider follow up with your Primary Care Provider.   HOLD BENTYL for now.  We have sent the following medications to your pharmacy for you to pick up at your convenience: Lomotil  Start Calmoseptine - apply around anal area twice daily as needed.  (over-the-counter)  Call if not improving.  Thank you for choosing me and [facility].   Tye Savoy, NP

## 2018-06-22 NOTE — Progress Notes (Signed)
Chief Complaint:    Diarrhea  IMPRESSION and PLAN:    40. 67 year old female with longstanding diarrhea predominant IBS.  Based on history, no reason to suspect the this is infectious.  -Patient quires about resuming Lotronex which she took years ago.  Since she is having solid stools part of the time I do not think Lotronex is a good option for her, at least not right now. -Patient is taking one dicyclomine every day.  I have asked her to hold the dicyclomine (unless has any cramping ) and will try Lomotil.  She will start with 1 every day.  If no improvement over the next couple days she can increase to twice daily -Patient will call me back if diarrhea not improving  2.  Perianal burning due to the diarrhea.  -Recommended a skin barrier such as calmoseptine as needed.  Situation should improve once the diarrhea comes under control  HPI:     Patient is a 67 year old female known to Dr. Carlean Purl.  She has a history of diarrhea predominant IBS, last seen for this late February of this year.  There was a question of whether or not Aricept could be contributing to the diarrhea.  At the time of visit she had improved on dicyclomine as needed  Patient has returned with complaints of diarrhea.  No recent antibiotics . She has been taking dicyclomine once daily.  he diarrhea is unpredictable, often urgent and sometimes she cannot make it to the bathroom in time. She cannot identify any specific triggers for the diarrhea, it is not dependent on what she eats or her emotional state.  She does have solid stools but more often than not stools are loose, sometimes several times a day.  She has no associated cramps.  There is no blood in the stool.  No fevers.  No nausea or vomiting.  No unexpected weight loss.  Labs 06/02/2018 and including BMP, CBC were normal.   Patient is inquiring about taking Lotronex again this was stopped several years ago due to cost and also her diarrhea was being nicely  managed with other agents.    Crystal [name] complains of perianal burning from all the diarrhea  Review of systems:     No chest pain, no SOB, no fevers, no urinary sx   Past Medical History:  Diagnosis Date  . Acute peptic ulcer of stomach    As a teenager  . Allergy    dust, cock roach,grass,weeds,molds  . Anxiety   . Arthritis    knees, hands, shoulders  . Asthma    02/08/10 FEV1 1.98 (80%), s/p saba 2.22 l/m (90%).nml  . Broken ankle    right foot, wearing a boot  . Cognitive change 06/10/2016  . Dementia ([place])   . Depression   . Diabetes mellitus 2003   Type II  . Diverticulosis   . Dyspepsia   . Fatty liver 12/19/2010  . GERD (gastroesophageal reflux disease)   . Hearing loss    bilateral hearing aids   . Herpes simplex virus (HSV) infection   . High cholesterol   . Hyperlipemia   . Hypertension   . Hypothyroidism 09/08/2013  . IBS (irritable bowel syndrome)   . Insomnia   . Internal hemorrhoids   . Joint pain 08/08/2016  . Low back pain 08/10/2016  . OSA (obstructive sleep apnea)    Does not use CPAP - old one broken, new sleep study to be scheduled and then  given a new CPAP machine  . Plantar fasciitis    no longer an issue  . Sun-damaged skin 12/09/2016  . SVD (spontaneous vaginal delivery)    x 4  . Vaginal Pap smear, abnormal   . Vitamin D deficiency 03/04/2016  . Wears partial dentures    upper dentures and lower partial    Patient's surgical history, family medical history, social history, medications and allergies were all reviewed in Epic   Serum creatinine: 0.81 mg/dL 06/02/18 1216 Estimated creatinine clearance: 62.6 mL/min  Current Outpatient Medications  Medication Sig Dispense Refill  . ACCU-CHEK SOFTCLIX LANCETS lancets Use as directed twice daily to check blood sugar. DX E11.9 200 each 3  . albuterol (VENTOLIN HFA) 108 (90 Base) MCG/ACT inhaler INHALE 2 PUFFS BY MOUTH INTO THE LUNGS EVERY 4 (FOUR) HOURS AS NEEDED FOR SHORTNESS OF BREATH AND  WHEEZING 18 g 1  . Alcohol Swabs (B-D SINGLE USE SWABS REGULAR) PADS Use as directed to check blood sugar. DX E11.9 100 each 3  . Apoaequorin (PREVAGEN EXTRA STRENGTH PO) Take 1 capsule by mouth daily.    Marland Kitchen atenolol (TENORMIN) 25 MG tablet Take 1 tablet (25 mg total) by mouth daily. 90 tablet 3  . atorvastatin (LIPITOR) 80 MG tablet TAKE 1 TABLET EVERY DAY 90 tablet 0  . Blood Glucose Calibration (ACCU-CHEK AVIVA) SOLN Use as directed to check blood sugar.  DX E11.9 1 each 3  . Blood Glucose Monitoring Suppl (ACCU-CHEK AVIVA PLUS) w/Device KIT USE AS DIRECTED TO CHECK BLOOD SUGAR. 1 kit 0  . cetirizine (ZYRTEC) 10 MG tablet Take 1 tablet (10 mg total) by mouth daily as needed for allergies. 30 tablet 11  . Cholecalciferol (VITAMIN D3) 2000 units TABS Take 1 tablet by mouth daily.    Marland Kitchen dicyclomine (BENTYL) 10 MG capsule TAKE 1 CAPSULE BY MOUTH EVERY 4 TO 6 HOURS AS NEEDED FOR ABDOMINAL PAIN AND CRAMPING OR DIARRHEA 60 capsule 1  . donepezil (ARICEPT) 10 MG tablet Take 1 tablet (10 mg total) by mouth at bedtime. 30 tablet 0  . escitalopram (LEXAPRO) 20 MG tablet Take 1 tablet (20 mg total) by mouth daily. 30 tablet 0  . fenofibrate 160 MG tablet TAKE 1 TABLET (160 MG TOTAL) BY MOUTH DAILY. 90 tablet 2  . fluticasone (FLONASE) 50 MCG/ACT nasal spray Place 2 sprays into both nostrils daily. 16 g 3  . glimepiride (AMARYL) 4 MG tablet Take 1 tablet (4 mg total) by mouth 2 (two) times daily. (Patient taking differently: Take 4 mg by mouth daily with breakfast. ) 180 tablet 1  . glucose blood (ACCU-CHEK AVIVA PLUS) test strip Use as directed twice daily to check blood sugar.  DX E11.9 200 each 3  . ibuprofen (ADVIL,MOTRIN) 600 MG tablet Take 1 tablet (600 mg total) by mouth every 6 (six) hours as needed. 30 tablet 1  . ketorolac (ACULAR) 0.5 % ophthalmic solution Place 1 drop into the right eye 4 (four) times daily. (Patient taking differently: Place 1 drop into both eyes 4 (four) times daily. ) 10 mL 0  .  levothyroxine (SYNTHROID, LEVOTHROID) 25 MCG tablet TAKE 1 TABLET DAILY BEFORE BREAKFAST. 90 tablet 0  . losartan (COZAAR) 50 MG tablet TAKE 1 TABLET EVERY DAY 90 tablet 0  . meloxicam (MOBIC) 7.5 MG tablet Take 1 tablet (7.5 mg total) by mouth daily. 30 tablet 0  . montelukast (SINGULAIR) 10 MG tablet TAKE 1 TABLET AT BEDTIME AS NEEDED FOR ALLERGIES 30 tablet 0  .  Multiple Vitamins-Minerals (CENTRUM SILVER PO) Take 1 tablet by mouth daily.      . pantoprazole (PROTONIX) 40 MG tablet TAKE 1 TABLET EVERY DAY 90 tablet 0  . prednisoLONE acetate (PRED FORTE) 1 % ophthalmic suspension Place 1 drop into the right eye 4 (four) times daily. 10 mL 0  . Probiotic Product (ALIGN) 4 MG CAPS Take 1 capsule by mouth daily. Reported on 07/02/2015    . sitaGLIPtin (JANUVIA) 100 MG tablet Take 1 tablet (100 mg total) by mouth daily. 90 tablet 1  . triamcinolone cream (KENALOG) 0.1 % Apply 1 application topically 2 (two) times daily.   0   No current facility-administered medications for this visit.     Physical Exam:     BP (!) 152/76   Pulse 76   Ht 5' 2.5" (1.588 m)   Wt 164 lb (74.4 kg)   LMP  (LMP Unknown)   BMI 29.52 kg/m   GENERAL:  Pleasant female in NAD PSYCH: : Cooperative, normal affect EENT:  conjunctiva pink, mucous membranes moist, neck supple without masses CARDIAC:  RRR, no murmur heard, no peripheral edema PULM: Normal respiratory effort, lungs CTA bilaterally, no wheezing ABDOMEN:  Nondistended, soft, nontender. No obvious masses, no hepatomegaly,  normal bowel sounds SKIN:  turgor, no lesions seen Musculoskeletal:  Normal muscle tone, normal strength NEURO: Alert and oriented x 3, no focal neurologic deficits   Crystal [name] , NP 06/22/2018, 10:49 AM

## 2018-06-22 NOTE — Patient Instructions (Addendum)
Please continue: - Januvia 100 mg before b'fast  Please stop Glimepiride, however, if sugars increase midday, please restart it at 2 mg before b'fast.  Please return in 4 months with your sugar log.

## 2018-06-30 NOTE — Progress Notes (Signed)
[facility] Note  07/02/2018     CHIEF COMPLAINT Patient presents for Retina Follow Up   HISTORY OF PRESENT ILLNESS: Crystal [name] is a 67 y.o. female who presents to the clinic today for:   HPI    Retina Follow Up    In right eye.  This started 3 months ago.  Severity is mild.  Since onset it is stable.  I, the attending physician,  performed the HPI with the patient and updated documentation appropriately.          Comments    F/U RT w/detach. OD. Patient states her vision is "doing good", denies new  Visual issues/onsets. Patient is gtt's(does not remember name of gtt's).        Last edited by Bernarda Caffey, MD on 07/02/2018  9:59 AM. (History)    pt states her vision is not 100% but getting better  Referring physician: Calvert Cantor, MD [address] [place] [address], [place] [address]  HISTORICAL INFORMATION:   Selected notes from the MEDICAL RECORD NUMBER Referral from Dr. Bing Plume for retina eval: - last exam by [facility], 03/17/17 - 2 wk history of floaters OU - hx of DM2 since 2003 - pseudophakia OU (complex CEIOL OD, 12.14.16, OS: 11.28.16)   CURRENT MEDICATIONS: Current Outpatient Medications (Ophthalmic Drugs)  Medication Sig  . ketorolac (ACULAR) 0.5 % ophthalmic solution Place 1 drop into the right eye 4 (four) times daily. (Patient taking differently: Place 1 drop into both eyes 4 (four) times daily. )  . prednisoLONE acetate (PRED FORTE) 1 % ophthalmic suspension Place 1 drop into the right eye 4 (four) times daily.   No current facility-administered medications for this visit.  (Ophthalmic Drugs)   Current Outpatient Medications (Other)  Medication Sig  . ACCU-CHEK SOFTCLIX LANCETS lancets Use as directed twice daily to check blood sugar. DX E11.9  . albuterol (VENTOLIN HFA) 108 (90 Base) MCG/ACT inhaler INHALE 2 PUFFS BY MOUTH INTO THE LUNGS EVERY 4 (FOUR) HOURS AS NEEDED FOR SHORTNESS OF BREATH AND WHEEZING  . Alcohol  Swabs (B-D SINGLE USE SWABS REGULAR) PADS Use as directed to check blood sugar. DX E11.9  . Apoaequorin (PREVAGEN EXTRA STRENGTH PO) Take 1 capsule by mouth daily.  Marland Kitchen atenolol (TENORMIN) 25 MG tablet Take 1 tablet (25 mg total) by mouth daily.  Marland Kitchen atorvastatin (LIPITOR) 80 MG tablet TAKE 1 TABLET EVERY DAY  . Blood Glucose Calibration (ACCU-CHEK AVIVA) SOLN Use as directed to check blood sugar.  DX E11.9  . Blood Glucose Monitoring Suppl (ACCU-CHEK AVIVA PLUS) w/Device KIT USE AS DIRECTED TO CHECK BLOOD SUGAR.  . cetirizine (ZYRTEC) 10 MG tablet Take 1 tablet (10 mg total) by mouth daily as needed for allergies.  . Cholecalciferol (VITAMIN D3) 2000 units TABS Take 1 tablet by mouth daily.  Marland Kitchen dicyclomine (BENTYL) 10 MG capsule TAKE 1 CAPSULE BY MOUTH EVERY 4 TO 6 HOURS AS NEEDED FOR ABDOMINAL PAIN AND CRAMPING OR DIARRHEA  . diphenoxylate-atropine (LOMOTIL) 2.5-0.025 MG tablet Take 1 tablet by mouth every morning. If continued diarrhea in the next 2-3 days may increase to twice daily.  Marland Kitchen donepezil (ARICEPT) 10 MG tablet Take 1 tablet (10 mg total) by mouth at bedtime.  Marland Kitchen escitalopram (LEXAPRO) 20 MG tablet Take 1 tablet (20 mg total) by mouth daily.  . fenofibrate 160 MG tablet TAKE 1 TABLET (160 MG TOTAL) BY MOUTH DAILY.  . fluticasone (FLONASE) 50 MCG/ACT nasal spray Place 2 sprays into  both nostrils daily.  Marland Kitchen glucose blood (ACCU-CHEK AVIVA PLUS) test strip Use as directed twice daily to check blood sugar.  DX E11.9  . ibuprofen (ADVIL,MOTRIN) 600 MG tablet Take 1 tablet (600 mg total) by mouth every 6 (six) hours as needed.  Marland Kitchen levothyroxine (SYNTHROID, LEVOTHROID) 25 MCG tablet TAKE 1 TABLET DAILY BEFORE BREAKFAST.  Marland Kitchen losartan (COZAAR) 50 MG tablet TAKE 1 TABLET EVERY DAY  . meloxicam (MOBIC) 7.5 MG tablet Take 1 tablet (7.5 mg total) by mouth daily.  . montelukast (SINGULAIR) 10 MG tablet TAKE 1 TABLET AT BEDTIME AS NEEDED FOR ALLERGIES  . Multiple Vitamins-Minerals (CENTRUM SILVER PO) Take 1  tablet by mouth daily.    . pantoprazole (PROTONIX) 40 MG tablet TAKE 1 TABLET EVERY DAY  . Probiotic Product (ALIGN) 4 MG CAPS Take 1 capsule by mouth daily. Reported on 07/02/2015  . sitaGLIPtin (JANUVIA) 100 MG tablet Take 1 tablet (100 mg total) by mouth daily.  Marland Kitchen triamcinolone cream (KENALOG) 0.1 % Apply 1 application topically 2 (two) times daily.    No current facility-administered medications for this visit.  (Other)      REVIEW OF SYSTEMS: ROS    Positive for: Eyes   Negative for: Constitutional, Gastrointestinal, Neurological, Skin, Genitourinary, Musculoskeletal, HENT, Endocrine, Cardiovascular, Respiratory, Psychiatric, Allergic/Imm, Heme/Lymph   Last edited by Zenovia Jordan, LPN on 39/09/90  3:30 AM. (History)       ALLERGIES Allergies  Allergen Reactions  . Compazine [Prochlorperazine Edisylate]     Comma for 3 days  . Sulfa Antibiotics Itching    severe itching  . Tetanus Toxoid Other (See Comments)    convulsions    PAST MEDICAL HISTORY Past Medical History:  Diagnosis Date  . Acute peptic ulcer of stomach    As a teenager  . Allergy    dust, cock roach,grass,weeds,molds  . Anxiety   . Arthritis    knees, hands, shoulders  . Asthma    02/08/10 FEV1 1.98 (80%), s/p saba 2.22 l/m (90%).nml  . Broken ankle    right foot, wearing a boot  . Cognitive change 06/10/2016  . Dementia ([place])   . Depression   . Diabetes mellitus 2003   Type II  . Diverticulosis   . Dyspepsia   . Fatty liver 12/19/2010  . GERD (gastroesophageal reflux disease)   . Hearing loss    bilateral hearing aids   . Herpes simplex virus (HSV) infection   . High cholesterol   . Hyperlipemia   . Hypertension   . Hypothyroidism 09/08/2013  . IBS (irritable bowel syndrome)   . Insomnia   . Internal hemorrhoids   . Joint pain 08/08/2016  . Low back pain 08/10/2016  . OSA (obstructive sleep apnea)    Does not use CPAP - old one broken, new sleep study to be scheduled and then  given a new CPAP machine  . Plantar fasciitis    no longer an issue  . Sun-damaged skin 12/09/2016  . SVD (spontaneous vaginal delivery)    x 4  . Vaginal Pap smear, abnormal   . Vitamin D deficiency 03/04/2016  . Wears partial dentures    upper dentures and lower partial   Past Surgical History:  Procedure Laterality Date  . APPENDECTOMY     age 41  . CATARACT EXTRACTION Bilateral 2016   Dr. Bing Plume  . CATARACT EXTRACTION, BILATERAL     12/8 and 12/14  . COLONOSCOPY  06/03/2010, 08/25/2011    severeal - polyps and diverticulosis   .  DILATATION & CURETTAGE/HYSTEROSCOPY WITH MYOSURE N/A 04/01/2018   Procedure: DILATATION & CURETTAGE/HYSTEROSCOPY WITH MYOSURE POLYPECTOMY;  Surgeon: Emily Filbert, MD;  Location: [facility];  Service: Gynecology;  Laterality: N/A;  . EYE SURGERY     cateracts removed bilateral  . GASTRECTOMY     partial - ulcer as a teen  . HAND SURGERY Right    thumb   . HEMORRHOID BANDING    . KNEE ARTHROSCOPY Left 1998  . NASAL SEPTUM SURGERY    . RHINOPLASTY    . TOE SURGERY Right 1995    FAMILY HISTORY Family History  Problem Relation Age of Onset  . Diabetes Mother        type II  . Heart attack Mother 35  . Stroke Mother   . Hyperlipidemia Mother   . Colon cancer Sister   . Cancer Sister        GI  . Heart disease Sister   . Asthma Maternal Grandmother   . Mental illness Son        PTSD from [employer]  . Parkinsonism Father   . Cancer Sister        stage 4 bladder cancer  . Colon polyps Sister   . Ulcers Other        bleeding gastric  . Heart defect Other        child  . Other Other        perforated bowel, child  . Cancer Brother        pancreatic cancer  . Amblyopia Neg Hx   . Blindness Neg Hx   . Cataracts Neg Hx   . Glaucoma Neg Hx   . Macular degeneration Neg Hx   . Retinal detachment Neg Hx   . Strabismus Neg Hx   . Retinitis pigmentosa Neg Hx     SOCIAL HISTORY Social History   Tobacco Use  . Smoking status: Never Smoker  .  Smokeless tobacco: Never Used  Substance Use Topics  . Alcohol use: Yes    Alcohol/week: 0.0 standard drinks    Comment: Rare occasions   . Drug use: No         OPHTHALMIC EXAM:  Base Eye Exam    Visual Acuity (Snellen - Linear)      Right Left   Dist cc 20/50 20/25   Dist ph cc NI NI   Correction:  Glasses       Tonometry (Tonopen, 9:07 AM)      Right Left   Pressure 14 14       Pupils      Dark Light Shape React APD   Right 4 3 Round Brisk None   Left 4 3 Round Brisk None       Visual Fields (Counting fingers)      Left Right    Full Full       Extraocular Movement      Right Left    Full, Ortho Full, Ortho       Neuro/Psych    Oriented x3:  Yes   Mood/Affect:  Normal       Dilation    Both eyes:  1.0% Mydriacyl, 2.5% Phenylephrine @ 9:02 AM        Slit Lamp and Fundus Exam    External Exam      Right Left   External Normal Normal       Slit Lamp Exam      Right Left   Lids/Lashes Dermatochalasis -  upper lid  Dermatochalasis - upper lid temporally , Telangiectasia   Conjunctiva/Sclera White and quiet White and quiet   Cornea 1+ Punctate epithelial erosions Well healed cataract wounds   Anterior Chamber Deep and clear Deep and quiet   Iris Round and dilated; 5.5 mm; TID at temporal pupil margin Round and dilated; 7.5 mm; PPM at 0600    Lens PC IOL in good position with open PC Posterior chamber intraocular lens - good position   Vitreous Vitreous syneresis Vitreous syneresis; Posterior vitreous detachment       Fundus Exam      Right Left   Disc +cupping, sharp rim, mild PPP +cupping, mild tilt, PPP   C/D Ratio 0.7 0.7   Macula Blunted foveal reflex, Epiretinal membrane, no CME, no heme Blunted foveal reflex, Retinal pigment epithelial mottling, No heme or edema   Vessels Vascular attenuation - mild Vascular attenuation - mild   Periphery flat; attached; trace RPE changes; hair line Retinal tears at 3 and 4 o'clock w/ good laser scars  surrounding; peripheral cystoid degeneration; supratemporal cobblestoning  Flat; attached; trace RPE changes, peripheral cystoid degneration          IMAGING AND PROCEDURES  Imaging and Procedures for 04/30/17  OCT, Retina - OU - Both Eyes       Right Eye Quality was good. Central Foveal Thickness: 358. Progression has improved. Findings include epiretinal membrane, no IRF, no SRF, normal foveal contour (CME resolved).   Left Eye Quality was good. Central Foveal Thickness: 273. Progression has been stable. Findings include normal foveal contour, no IRF, no SRF.   Notes Images taken, stored on drive   Diagnosis / Impression:  OD - NFP; +ERM; CME resolved OS - NFP; no IRF/SRF  Clinical management:  See below  Abbreviations: NFP - Normal foveal profile. CME - cystoid macular edema. PED - pigment epithelial detachment. IRF - intraretinal fluid. SRF - subretinal fluid. EZ - ellipsoid zone. ERM - epiretinal membrane. ORA - outer retinal atrophy. ORT - outer retinal tubulation. SRHM - subretinal hyper-reflective material                  ASSESSMENT/PLAN:    ICD-10-CM   1. 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 Posterior vitreous detachment of both eyes H43.813   3. Type 2 diabetes mellitus without complication, without long-term current use of insulin (HCC) E11.9   4. Pseudophakia of both eyes Z96.1   5. CME (cystoid macular edema), right H35.351   6. Epiretinal membrane (ERM) of right eye H35.371   7. Cystoid macular edema of right eye H35.351   8. Retinal edema H35.81 OCT, Retina - OU - Both Eyes    1. Peripheral retinal tears OD  Small flap tears at ora, 0300 and 0400 oclock  s/p barrier laser retinopexy OD, 03/18/17 -- good laser in place  Stable; No new RT/RD  2. PVD / vitreous syneresis OU  Discussed findings and prognosis  No RT or RD on 360 scleral depressed exam OS  Reviewed s/s of RT/RD  Strict return precautions for any such  RT/RD signs/symptoms  3. DM2 without retinopathy  On oral meds  Discussed importance of tight BG and BP control  4. Pseudophakia OU  IOLs in good position OU -- beautiful surgeries by Dr. Bing Plume  OD with history of anterior vitrectomy and 3 piece sulcus IOL  monitor  5,6. CME OD  Resolved now on PF and ketorolac (12.20.19)  Etiology could be  multifactorial -- ERM, delayed post-cataract  Start PF taper -- 3,2,1 drops daily, decrease weekly  stop Ketorolac QID OD  F/U 4-6 weeks  7. Epiretinal membrane, right eye   The natural history, anatomy, potential for loss of vision, and treatment options including vitrectomy techniques and the complications of endophthalmitis, retinal detachment, vitreous hemorrhage and permanent vision loss discussed with the patient  Mild ERM OD  May have contributed to CME OD (#5)  Not yet surgical -- monitor for pucker / symptoms     Ophthalmic Meds Ordered this visit:  No orders of the defined types were placed in this encounter.      Return for F/U 4-6 weeks, CME OD, DFE, OCT.  There are no Patient Instructions on file for this visit.   Explained the diagnoses, plan, and follow up with the patient and they expressed understanding.  Patient expressed understanding of the importance of proper follow up care.   This document serves as a record of services personally performed by Gardiner Sleeper, MD, PhD. It was created on their behalf by Ernest Mallick, OA, an ophthalmic assistant. The creation of this record is the provider's dictation and/or activities during the visit.    Electronically signed by: Ernest Mallick, OA  12.18.19 12:41 PM    Gardiner Sleeper, M.D., Ph.D. Diseases & Surgery of the Retina and Vitreous [facility]  I have reviewed the above documentation for accuracy and completeness, and I agree with the above. Gardiner Sleeper, M.D., Ph.D. 07/02/18 12:43 PM     Abbreviations: M myopia (nearsighted); A  astigmatism; H hyperopia (farsighted); P presbyopia; Mrx spectacle prescription;  CTL contact lenses; OD right eye; OS left eye; OU both eyes  XT exotropia; ET esotropia; PEK punctate epithelial keratitis; PEE punctate epithelial erosions; DES dry eye syndrome; MGD meibomian gland dysfunction; ATs artificial tears; PFAT's preservative free artificial tears; Tull nuclear sclerotic cataract; PSC posterior subcapsular cataract; ERM epi-retinal membrane; PVD posterior vitreous detachment; RD retinal detachment; DM diabetes mellitus; DR diabetic retinopathy; NPDR non-proliferative diabetic retinopathy; PDR proliferative diabetic retinopathy; CSME clinically significant macular edema; DME diabetic macular edema; dbh dot blot hemorrhages; CWS cotton wool spot; POAG primary open angle glaucoma; C/D cup-to-disc ratio; HVF humphrey visual field; GVF goldmann visual field; OCT optical coherence tomography; IOP intraocular pressure; BRVO Branch retinal vein occlusion; CRVO central retinal vein occlusion; CRAO central retinal artery occlusion; BRAO branch retinal artery occlusion; RT retinal tear; SB scleral buckle; PPV pars plana vitrectomy; VH Vitreous hemorrhage; PRP panretinal laser photocoagulation; IVK intravitreal kenalog; VMT vitreomacular traction; MH Macular hole;  NVD neovascularization of the disc; NVE neovascularization elsewhere; AREDS age related eye disease study; ARMD age related macular degeneration; POAG primary open angle glaucoma; EBMD epithelial/anterior basement membrane dystrophy; ACIOL anterior chamber intraocular lens; IOL intraocular lens; PCIOL posterior chamber intraocular lens; Phaco/IOL phacoemulsification with intraocular lens placement; Gregory photorefractive keratectomy; LASIK laser assisted in situ keratomileusis; HTN hypertension; DM diabetes mellitus; COPD chronic obstructive pulmonary disease

## 2018-07-02 ENCOUNTER — Encounter (INDEPENDENT_AMBULATORY_CARE_PROVIDER_SITE_OTHER): Payer: Self-pay | Admitting: Ophthalmology

## 2018-07-02 ENCOUNTER — Ambulatory Visit (INDEPENDENT_AMBULATORY_CARE_PROVIDER_SITE_OTHER): Payer: Medicare HMO | Admitting: Ophthalmology

## 2018-07-02 DIAGNOSIS — H35371 Puckering of macula, right eye: Secondary | ICD-10-CM | POA: Diagnosis not present

## 2018-07-02 DIAGNOSIS — H3581 Retinal edema: Secondary | ICD-10-CM

## 2018-07-02 DIAGNOSIS — E119 Type 2 diabetes mellitus without complications: Secondary | ICD-10-CM | POA: Diagnosis not present

## 2018-07-02 DIAGNOSIS — H43813 Vitreous degeneration, bilateral: Secondary | ICD-10-CM

## 2018-07-02 DIAGNOSIS — H35351 Cystoid macular degeneration, right eye: Secondary | ICD-10-CM | POA: Diagnosis not present

## 2018-07-02 DIAGNOSIS — H33331 Multiple defects of retina without detachment, right eye: Secondary | ICD-10-CM

## 2018-07-02 DIAGNOSIS — Z961 Presence of intraocular lens: Secondary | ICD-10-CM
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
## 2018-07-08 ENCOUNTER — Ambulatory Visit: Payer: Medicare HMO | Admitting: Cardiology

## 2018-07-08 VITALS — BP 162/90 | HR 71 | Ht 62.5 in | Wt 162.0 lb

## 2018-07-08 DIAGNOSIS — R079 Chest pain, unspecified: Secondary | ICD-10-CM

## 2018-07-08 DIAGNOSIS — I10 Essential (primary) hypertension: Secondary | ICD-10-CM | POA: Diagnosis not present

## 2018-07-08 MED ORDER — CELECOXIB 100 MG PO CAPS: 100.0000 mg | ORAL_CAPSULE | Freq: Every day | ORAL | 0 refills | Status: DC

## 2018-07-08 MED FILL — CELECOXIB 100 MG CAP: 100 | 60 days supply | Qty: 60 | Fill #0

## 2018-07-08 NOTE — Patient Instructions (Addendum)
Medication Instructions:  Your physician has recommended you make the following change in your medication:   START celecoxib (celebrex) 100 mg: Take 1 capsule daily for 2 weeks then continue if unimproved   If you need a refill on your cardiac medications before your next appointment, please call your pharmacy.   Lab work: None  If you have labs (blood work) drawn today and your tests are completely normal, you will receive your results only by: Marland Kitchen MyChart Message (if you have MyChart) OR . A paper copy in the mail If you have any lab test that is abnormal or we need to change your treatment, we will call you to review the results.  Testing/Procedures: None  Follow-Up: At [facility], you and your health needs are our priority.  As part of our continuing mission to provide you with exceptional heart care, we have created designated Provider Care Teams.  These Care Teams include your primary Cardiologist (physician) and Advanced Practice Providers (APPs -  Physician Assistants and Nurse Practitioners) who all work together to provide you with the care you need, when you need it. You will need a follow up appointment in 1 months.       Costochondritis Costochondritis is swelling and irritation (inflammation) of the tissue (cartilage) that connects your ribs to your breastbone (sternum). This causes pain in the front of your chest. Usually, the pain:  Starts gradually.  Is in more than one rib. This condition usually goes away on its own over time. Follow these instructions at home:  Do not do anything that makes your pain worse.  If directed, put ice on the painful area: ? Put ice in a plastic bag. ? Place a towel between your skin and the bag. ? Leave the ice on for 20 minutes, 2-3 times a day.  If directed, put heat on the affected area as often as told by your doctor. Use the heat source that your doctor tells you to use, such as a moist heat pack or a heating  pad. ? Place a towel between your skin and the heat source. ? Leave the heat on for 20-30 minutes. ? Take off the heat if your skin turns bright red. This is very important if you cannot feel pain, heat, or cold. You may have a greater risk of getting burned.  Take over-the-counter and prescription medicines only as told by your doctor.  Return to your normal activities as told by your doctor. Ask your doctor what activities are safe for you.  Keep all follow-up visits as told by your doctor. This is important. Contact a doctor if:  You have chills or a fever.  Your pain does not go away or it gets worse.  You have a cough that does not go away. Get help right away if:  You are short of breath. This information is not intended to replace advice given to you by your health care provider. Make sure you discuss any questions you have with your health care provider. Document Released: 12/17/2007 Document Revised: 01/18/2016 Document Reviewed: 10/24/2015 Elsevier Interactive Patient Education  2019 [employer].

## 2018-07-09 ENCOUNTER — Other Ambulatory Visit: Payer: Self-pay | Admitting: Medical

## 2018-07-09 MED FILL — JANUVIA 100 MG TABLET: 100 | 30 days supply | Qty: 30 | Fill #4

## 2018-07-09 MED FILL — MELOXICAM 7.5 MG TABLET: 7.5 | 30 days supply | Qty: 30 | Fill #0

## 2018-07-15 ENCOUNTER — Ambulatory Visit: Payer: Medicare HMO | Admitting: Family Medicine

## 2018-07-15 ENCOUNTER — Encounter: Payer: Self-pay | Admitting: Family Medicine

## 2018-07-15 VITALS — BP 177/97 | HR 66 | Ht 63.0 in | Wt 162.0 lb

## 2018-07-15 DIAGNOSIS — M79672 Pain in left foot: Secondary | ICD-10-CM | POA: Diagnosis not present

## 2018-07-15 MED FILL — DIPHENOXYLATE-ATROPINE 2.5-: 2.5-0.025 | 30 days supply | Qty: 30 | Fill #1

## 2018-07-15 NOTE — Patient Instructions (Signed)
You have early evidence of a stress fracture of your fifth metatarsal. We worry more about these because they're not as supported by the surrounding bones and ligaments. Wear boot when up and walking around. Can use crutches if needed - it's possible we will have to make you non-weight bearing. Icing 15 minutes at a time 3-4 times a day. Elevate above your heart as much as possible. Aleve 2 tabs twice a day with food as needed for pain and inflammation. Can take tylenol, use topical medication like aspercreme or biofreeze in addition to this. Follow up with me in 2 weeks.

## 2018-07-15 NOTE — Progress Notes (Signed)
PCP: [name], MD  Subjective:   HPI: Patient is a 68 y.o. female here for left foot pain.  Patient denies known injury or trauma. She reports about 3-4 days ago she started to get pain dorsolateral aspect of left foot. Pain up to 5/10 and sharp, worse with ambulation. Has only tried elevating with mild benefit. No skin changes, numbness.  Past Medical History:  Diagnosis Date  . Acute peptic ulcer of stomach    As a teenager  . Allergy    dust, cock roach,grass,weeds,molds  . Anxiety   . Arthritis    knees, hands, shoulders  . Asthma    02/08/10 FEV1 1.98 (80%), s/p saba 2.22 l/m (90%).nml  . Broken ankle    right foot, wearing a boot  . Cognitive change 06/10/2016  . Dementia ([place])   . Depression   . Diabetes mellitus 2003   Type II  . Diverticulosis   . Dyspepsia   . Fatty liver 12/19/2010  . GERD (gastroesophageal reflux disease)   . Hearing loss    bilateral hearing aids   . Herpes simplex virus (HSV) infection   . High cholesterol   . Hyperlipemia   . Hypertension   . Hypothyroidism 09/08/2013  . IBS (irritable bowel syndrome)   . Insomnia   . Internal hemorrhoids   . Joint pain 08/08/2016  . Low back pain 08/10/2016  . OSA (obstructive sleep apnea)    Does not use CPAP - old one broken, new sleep study to be scheduled and then given a new CPAP machine  . Plantar fasciitis    no longer an issue  . Sun-damaged skin 12/09/2016  . SVD (spontaneous vaginal delivery)    x 4  . Vaginal Pap smear, abnormal   . Vitamin D deficiency 03/04/2016  . Wears partial dentures    upper dentures and lower partial    Current Outpatient Medications on File Prior to Visit  Medication Sig Dispense Refill  . ACCU-CHEK SOFTCLIX LANCETS lancets Use as directed twice daily to check blood sugar. DX E11.9 200 each 3  . albuterol (VENTOLIN HFA) 108 (90 Base) MCG/ACT inhaler INHALE 2 PUFFS BY MOUTH INTO THE LUNGS EVERY 4 (FOUR) HOURS AS NEEDED FOR SHORTNESS OF BREATH AND  WHEEZING 18 g 1  . Alcohol Swabs (B-D SINGLE USE SWABS REGULAR) PADS Use as directed to check blood sugar. DX E11.9 100 each 3  . Apoaequorin (PREVAGEN EXTRA STRENGTH PO) Take 1 capsule by mouth daily.    Marland Kitchen atenolol (TENORMIN) 25 MG tablet Take 1 tablet (25 mg total) by mouth daily. 90 tablet 3  . atorvastatin (LIPITOR) 80 MG tablet TAKE 1 TABLET EVERY DAY 90 tablet 0  . Blood Glucose Calibration (ACCU-CHEK AVIVA) SOLN Use as directed to check blood sugar.  DX E11.9 1 each 3  . Blood Glucose Monitoring Suppl (ACCU-CHEK AVIVA PLUS) w/Device KIT USE AS DIRECTED TO CHECK BLOOD SUGAR. 1 kit 0  . celecoxib (CELEBREX) 100 MG capsule Take 1 capsule (100 mg total) by mouth daily. 60 capsule 0  . cetirizine (ZYRTEC) 10 MG tablet Take 1 tablet (10 mg total) by mouth daily as needed for allergies. 30 tablet 11  . Cholecalciferol (VITAMIN D3) 2000 units TABS Take 1 tablet by mouth daily.    Marland Kitchen dicyclomine (BENTYL) 10 MG capsule TAKE 1 CAPSULE BY MOUTH EVERY 4 TO 6 HOURS AS NEEDED FOR ABDOMINAL PAIN AND CRAMPING OR DIARRHEA 60 capsule 1  . diphenoxylate-atropine (LOMOTIL) 2.5-0.025 MG tablet Take  1 tablet by mouth every morning. If continued diarrhea in the next 2-3 days may increase to twice daily. 30 tablet 3  . donepezil (ARICEPT) 10 MG tablet Take 1 tablet (10 mg total) by mouth at bedtime. 30 tablet 0  . escitalopram (LEXAPRO) 20 MG tablet Take 1 tablet (20 mg total) by mouth daily. 30 tablet 0  . fenofibrate 160 MG tablet TAKE 1 TABLET (160 MG TOTAL) BY MOUTH DAILY. 90 tablet 2  . fluticasone (FLONASE) 50 MCG/ACT nasal spray Place 2 sprays into both nostrils daily. 16 g 3  . glucose blood (ACCU-CHEK AVIVA PLUS) test strip Use as directed twice daily to check blood sugar.  DX E11.9 200 each 3  . ibuprofen (ADVIL,MOTRIN) 600 MG tablet Take 1 tablet (600 mg total) by mouth every 6 (six) hours as needed. 30 tablet 1  . ketorolac (ACULAR) 0.5 % ophthalmic solution Place 1 drop into the right eye 4 (four) times  daily. (Patient taking differently: Place 1 drop into both eyes 4 (four) times daily. ) 10 mL 0  . levothyroxine (SYNTHROID, LEVOTHROID) 25 MCG tablet TAKE 1 TABLET DAILY BEFORE BREAKFAST. 90 tablet 0  . losartan (COZAAR) 50 MG tablet TAKE 1 TABLET EVERY DAY 90 tablet 0  . meloxicam (MOBIC) 7.5 MG tablet TAKE 1 TABLET (7.5 MG TOTAL) BY MOUTH DAILY. 30 tablet 0  . montelukast (SINGULAIR) 10 MG tablet TAKE 1 TABLET AT BEDTIME AS NEEDED FOR ALLERGIES 30 tablet 0  . Multiple Vitamins-Minerals (CENTRUM SILVER PO) Take 1 tablet by mouth daily.      . pantoprazole (PROTONIX) 40 MG tablet TAKE 1 TABLET EVERY DAY 90 tablet 0  . prednisoLONE acetate (PRED FORTE) 1 % ophthalmic suspension Place 1 drop into the right eye 4 (four) times daily. 10 mL 0  . Probiotic Product (ALIGN) 4 MG CAPS Take 1 capsule by mouth daily. Reported on 07/02/2015    . sitaGLIPtin (JANUVIA) 100 MG tablet Take 1 tablet (100 mg total) by mouth daily. 90 tablet 1  . triamcinolone cream (KENALOG) 0.1 % Apply 1 application topically 2 (two) times daily.   0   No current facility-administered medications on file prior to visit.     Past Surgical History:  Procedure Laterality Date  . APPENDECTOMY     age 60  . CATARACT EXTRACTION Bilateral 2016   Dr. Bing Plume  . CATARACT EXTRACTION, BILATERAL     12/8 and 12/14  . COLONOSCOPY  06/03/2010, 08/25/2011    severeal - polyps and diverticulosis   . DILATATION & CURETTAGE/HYSTEROSCOPY WITH MYOSURE N/A 04/01/2018   Procedure: DILATATION & CURETTAGE/HYSTEROSCOPY WITH MYOSURE POLYPECTOMY;  Surgeon: Emily Filbert, MD;  Location: [facility];  Service: Gynecology;  Laterality: N/A;  . EYE SURGERY     cateracts removed bilateral  . GASTRECTOMY     partial - ulcer as a teen  . HAND SURGERY Right    thumb   . HEMORRHOID BANDING    . KNEE ARTHROSCOPY Left 1998  . NASAL SEPTUM SURGERY    . RHINOPLASTY    . TOE SURGERY Right 1995    Allergies  Allergen Reactions  . Compazine [Prochlorperazine  Edisylate]     Comma for 3 days  . Sulfa Antibiotics Itching    severe itching  . Tetanus Toxoid Other (See Comments)    convulsions    Social History   Socioeconomic History  . Marital status: Widowed    Spouse name: Not on file  . Number of children: 4  .  Years of education: Not on file  . Highest education level: Not on file  Occupational History  . Occupation: retired    Fish farm manager: [employer]  Social Needs  . Financial resource strain: Not on file  . Food insecurity:    Worry: Not on file    Inability: Not on file  . Transportation needs:    Medical: Not on file    Non-medical: Not on file  Tobacco Use  . Smoking status: Never Smoker  . Smokeless tobacco: Never Used  Substance and Sexual Activity  . Alcohol use: Yes    Alcohol/week: 0.0 standard drinks    Comment: Rare occasions   . Drug use: No  . Sexual activity: Not Currently    Birth control/protection: Post-menopausal  Lifestyle  . Physical activity:    Days per week: Not on file    Minutes per session: Not on file  . Stress: Not on file  Relationships  . Social connections:    Talks on phone: Not on file    Gets together: Not on file    Attends religious service: Not on file    Active member of club or organization: Not on file    Attends meetings of clubs or organizations: Not on file    Relationship status: Not on file  . Intimate partner violence:    Fear of current or ex partner: Not on file    Emotionally abused: Not on file    Physically abused: Not on file    Forced sexual activity: Not on file  Other Topics Concern  . Not on file  Social History Narrative   No caffeine drinks daily     Family History  Problem Relation Age of Onset  . Diabetes Mother        type II  . Heart attack Mother 57  . Stroke Mother   . Hyperlipidemia Mother   . Colon cancer Sister   . Cancer Sister        GI  . Heart disease Sister   . Asthma Maternal Grandmother   . Mental illness Son        PTSD from  [employer]  . Parkinsonism Father   . Cancer Sister        stage 4 bladder cancer  . Colon polyps Sister   . Ulcers Other        bleeding gastric  . Heart defect Other        child  . Other Other        perforated bowel, child  . Cancer Brother        pancreatic cancer  . Amblyopia Neg Hx   . Blindness Neg Hx   . Cataracts Neg Hx   . Glaucoma Neg Hx   . Macular degeneration Neg Hx   . Retinal detachment Neg Hx   . Strabismus Neg Hx   . Retinitis pigmentosa Neg Hx     BP (!) 177/97   Pulse 66   Ht '5\' 3"'  (1.6 m)   Wt 162 lb (73.5 kg)   LMP  (LMP Unknown)   BMI 28.70 kg/m   Review of Systems: See HPI above.     Objective:  Physical Exam:  Gen: NAD, comfortable in exam room  Left foot/ankle: Mild swelling dorsolaterally.  No bruising, other deformity. FROM with 5/5 strength all directions. TTP 3rd-5th metatarsals, over cuboid, along peroneal tendons. Negative ant drawer and talar tilt.   Negative syndesmotic compression. Thompsons test negative. NV intact  distally.  Right foot/ankle: No deformity. FROM with 5/5 strength. No tenderness to palpation. NVI distally.   MSK u/s left foot:  Peroneal tendons intact without abnormalities.  5th metatarsal with edema overlying the cortex and neovascularity.  4th, 3rd metatarsals and cuboid appear normal.    Assessment & Plan:  1. Left foot pain - concerning for early stress fracture/reaction of 5th metatarsal given cortical edema and neovascularity.  Will wear cam walker with crutches as needed.  Icing, aleve, elevation.  F/u in 2 weeks.  Discussed possibility she may have to be non weight bearing.

## 2018-07-19 MED FILL — MELOXICAM 7.5 MG TABLET: 7.5 | 30 days supply | Qty: 30 | Fill #0

## 2018-07-19 MED FILL — JANUVIA 100 MG TABLET: 100 | 30 days supply | Qty: 30 | Fill #4

## 2018-07-19 MED FILL — AMOXICILLIN 500 MG CAPSULE: 500 | 10 days supply | Qty: 31 | Fill #0

## 2018-07-26 MED FILL — TRIAMCINOLONE 0.1% CREAM: 0.1 | 15 days supply | Qty: 30 | Fill #0

## 2018-07-29 ENCOUNTER — Encounter: Payer: Self-pay | Admitting: Family Medicine

## 2018-07-29 ENCOUNTER — Ambulatory Visit: Payer: Medicare HMO | Admitting: Family Medicine

## 2018-07-29 ENCOUNTER — Ambulatory Visit: Payer: Self-pay

## 2018-07-29 VITALS — BP 150/92 | Ht 62.0 in | Wt 164.0 lb

## 2018-07-29 DIAGNOSIS — M25572 Pain in left ankle and joints of left foot: Secondary | ICD-10-CM

## 2018-07-29 NOTE — Patient Instructions (Signed)
Your fifth metatarsal stress fracture is healing. Wear boot when up and walking around. Can use crutches if needed. Icing 15 minutes at a time 3-4 times a day. Elevate above your heart as much as possible. Ibuprofen as needed for pain and inflammation. Can take tylenol, use topical medication like aspercreme or biofreeze in addition to this. Follow up with me in 4 weeks.

## 2018-07-30 ENCOUNTER — Encounter: Payer: Self-pay | Admitting: Family Medicine

## 2018-07-30 ENCOUNTER — Encounter (INDEPENDENT_AMBULATORY_CARE_PROVIDER_SITE_OTHER): Payer: Medicare HMO | Admitting: Ophthalmology

## 2018-07-30 NOTE — Progress Notes (Signed)
PCP: [name], MD  Subjective:   HPI: Patient is a 68 y.o. female here for left foot pain.  1/2: Patient denies known injury or trauma. She reports about 3-4 days ago she started to get pain dorsolateral aspect of left foot. Pain up to 5/10 and sharp, worse with ambulation. Has only tried elevating with mild benefit. No skin changes, numbness.  1/16: Patient reports pain at 6/10 level at worst with ambulation, sharp. Not been adherent to wearing boot all the time - states she has to work so wearing non-slip shoes at work then puts but on other times. Has been taking ibuprofen, using topical biofreeze. No skin changes, numbness. Swelling has improved.  Past Medical History:  Diagnosis Date  . Acute peptic ulcer of stomach    As a teenager  . Allergy    dust, cock roach,grass,weeds,molds  . Anxiety   . Arthritis    knees, hands, shoulders  . Asthma    02/08/10 FEV1 1.98 (80%), s/p saba 2.22 l/m (90%).nml  . Broken ankle    right foot, wearing a boot  . Cognitive change 06/10/2016  . Dementia ([place])   . Depression   . Diabetes mellitus 2003   Type II  . Diverticulosis   . Dyspepsia   . Fatty liver 12/19/2010  . GERD (gastroesophageal reflux disease)   . Hearing loss    bilateral hearing aids   . Herpes simplex virus (HSV) infection   . High cholesterol   . Hyperlipemia   . Hypertension   . Hypothyroidism 09/08/2013  . IBS (irritable bowel syndrome)   . Insomnia   . Internal hemorrhoids   . Joint pain 08/08/2016  . Low back pain 08/10/2016  . OSA (obstructive sleep apnea)    Does not use CPAP - old one broken, new sleep study to be scheduled and then given a new CPAP machine  . Plantar fasciitis    no longer an issue  . Sun-damaged skin 12/09/2016  . SVD (spontaneous vaginal delivery)    x 4  . Vaginal Pap smear, abnormal   . Vitamin D deficiency 03/04/2016  . Wears partial dentures    upper dentures and lower partial    Current Outpatient Medications on  File Prior to Visit  Medication Sig Dispense Refill  . ACCU-CHEK SOFTCLIX LANCETS lancets Use as directed twice daily to check blood sugar. DX E11.9 200 each 3  . albuterol (VENTOLIN HFA) 108 (90 Base) MCG/ACT inhaler INHALE 2 PUFFS BY MOUTH INTO THE LUNGS EVERY 4 (FOUR) HOURS AS NEEDED FOR SHORTNESS OF BREATH AND WHEEZING 18 g 1  . Alcohol Swabs (B-D SINGLE USE SWABS REGULAR) PADS Use as directed to check blood sugar. DX E11.9 100 each 3  . Apoaequorin (PREVAGEN EXTRA STRENGTH PO) Take 1 capsule by mouth daily.    Marland Kitchen atenolol (TENORMIN) 25 MG tablet Take 1 tablet (25 mg total) by mouth daily. 90 tablet 3  . atorvastatin (LIPITOR) 80 MG tablet TAKE 1 TABLET EVERY DAY 90 tablet 0  . Blood Glucose Calibration (ACCU-CHEK AVIVA) SOLN Use as directed to check blood sugar.  DX E11.9 1 each 3  . Blood Glucose Monitoring Suppl (ACCU-CHEK AVIVA PLUS) w/Device KIT USE AS DIRECTED TO CHECK BLOOD SUGAR. 1 kit 0  . celecoxib (CELEBREX) 100 MG capsule Take 1 capsule (100 mg total) by mouth daily. 60 capsule 0  . cetirizine (ZYRTEC) 10 MG tablet Take 1 tablet (10 mg total) by mouth daily as needed for allergies. [place]  tablet 11  . Cholecalciferol (VITAMIN D3) 2000 units TABS Take 1 tablet by mouth daily.    Marland Kitchen dicyclomine (BENTYL) 10 MG capsule TAKE 1 CAPSULE BY MOUTH EVERY 4 TO 6 HOURS AS NEEDED FOR ABDOMINAL PAIN AND CRAMPING OR DIARRHEA 60 capsule 1  . diphenoxylate-atropine (LOMOTIL) 2.5-0.025 MG tablet Take 1 tablet by mouth every morning. If continued diarrhea in the next 2-3 days may increase to twice daily. 30 tablet 3  . donepezil (ARICEPT) 10 MG tablet Take 1 tablet (10 mg total) by mouth at bedtime. 30 tablet 0  . escitalopram (LEXAPRO) 20 MG tablet Take 1 tablet (20 mg total) by mouth daily. 30 tablet 0  . fenofibrate 160 MG tablet TAKE 1 TABLET (160 MG TOTAL) BY MOUTH DAILY. 90 tablet 2  . fluticasone (FLONASE) 50 MCG/ACT nasal spray Place 2 sprays into both nostrils daily. 16 g 3  . glucose blood  (ACCU-CHEK AVIVA PLUS) test strip Use as directed twice daily to check blood sugar.  DX E11.9 200 each 3  . ibuprofen (ADVIL,MOTRIN) 600 MG tablet Take 1 tablet (600 mg total) by mouth every 6 (six) hours as needed. 30 tablet 1  . ketorolac (ACULAR) 0.5 % ophthalmic solution Place 1 drop into the right eye 4 (four) times daily. (Patient taking differently: Place 1 drop into both eyes 4 (four) times daily. ) 10 mL 0  . levothyroxine (SYNTHROID, LEVOTHROID) 25 MCG tablet TAKE 1 TABLET DAILY BEFORE BREAKFAST. 90 tablet 0  . losartan (COZAAR) 50 MG tablet TAKE 1 TABLET EVERY DAY 90 tablet 0  . meloxicam (MOBIC) 7.5 MG tablet TAKE 1 TABLET (7.5 MG TOTAL) BY MOUTH DAILY. 30 tablet 0  . montelukast (SINGULAIR) 10 MG tablet TAKE 1 TABLET AT BEDTIME AS NEEDED FOR ALLERGIES 30 tablet 0  . Multiple Vitamins-Minerals (CENTRUM SILVER PO) Take 1 tablet by mouth daily.      . pantoprazole (PROTONIX) 40 MG tablet TAKE 1 TABLET EVERY DAY 90 tablet 0  . prednisoLONE acetate (PRED FORTE) 1 % ophthalmic suspension Place 1 drop into the right eye 4 (four) times daily. 10 mL 0  . Probiotic Product (ALIGN) 4 MG CAPS Take 1 capsule by mouth daily. Reported on 07/02/2015    . sitaGLIPtin (JANUVIA) 100 MG tablet Take 1 tablet (100 mg total) by mouth daily. 90 tablet 1  . triamcinolone cream (KENALOG) 0.1 % Apply 1 application topically 2 (two) times daily.   0   No current facility-administered medications on file prior to visit.     Past Surgical History:  Procedure Laterality Date  . APPENDECTOMY     age 76  . CATARACT EXTRACTION Bilateral 2016   Dr. Bing Plume  . CATARACT EXTRACTION, BILATERAL     12/8 and 12/14  . COLONOSCOPY  06/03/2010, 08/25/2011    severeal - polyps and diverticulosis   . DILATATION & CURETTAGE/HYSTEROSCOPY WITH MYOSURE N/A 04/01/2018   Procedure: DILATATION & CURETTAGE/HYSTEROSCOPY WITH MYOSURE POLYPECTOMY;  Surgeon: Emily Filbert, MD;  Location: [facility];  Service: Gynecology;  Laterality: N/A;   . EYE SURGERY     cateracts removed bilateral  . GASTRECTOMY     partial - ulcer as a teen  . HAND SURGERY Right    thumb   . HEMORRHOID BANDING    . KNEE ARTHROSCOPY Left 1998  . NASAL SEPTUM SURGERY    . RHINOPLASTY    . TOE SURGERY Right 1995    Allergies  Allergen Reactions  . Compazine [Prochlorperazine Edisylate]  Comma for 3 days  . Sulfa Antibiotics Itching    severe itching  . Tetanus Toxoid Other (See Comments)    convulsions    Social History   Socioeconomic History  . Marital status: Widowed    Spouse name: Not on file  . Number of children: 4  . Years of education: Not on file  . Highest education level: Not on file  Occupational History  . Occupation: retired    Fish farm manager: [employer]  Social Needs  . Financial resource strain: Not on file  . Food insecurity:    Worry: Not on file    Inability: Not on file  . Transportation needs:    Medical: Not on file    Non-medical: Not on file  Tobacco Use  . Smoking status: Never Smoker  . Smokeless tobacco: Never Used  Substance and Sexual Activity  . Alcohol use: Yes    Alcohol/week: 0.0 standard drinks    Comment: Rare occasions   . Drug use: No  . Sexual activity: Not Currently    Birth control/protection: Post-menopausal  Lifestyle  . Physical activity:    Days per week: Not on file    Minutes per session: Not on file  . Stress: Not on file  Relationships  . Social connections:    Talks on phone: Not on file    Gets together: Not on file    Attends religious service: Not on file    Active member of club or organization: Not on file    Attends meetings of clubs or organizations: Not on file    Relationship status: Not on file  . Intimate partner violence:    Fear of current or ex partner: Not on file    Emotionally abused: Not on file    Physically abused: Not on file    Forced sexual activity: Not on file  Other Topics Concern  . Not on file  Social History Narrative   No caffeine drinks  daily     Family History  Problem Relation Age of Onset  . Diabetes Mother        type II  . Heart attack Mother 40  . Stroke Mother   . Hyperlipidemia Mother   . Colon cancer Sister   . Cancer Sister        GI  . Heart disease Sister   . Asthma Maternal Grandmother   . Mental illness Son        PTSD from [employer]  . Parkinsonism Father   . Cancer Sister        stage 4 bladder cancer  . Colon polyps Sister   . Ulcers Other        bleeding gastric  . Heart defect Other        child  . Other Other        perforated bowel, child  . Cancer Brother        pancreatic cancer  . Amblyopia Neg Hx   . Blindness Neg Hx   . Cataracts Neg Hx   . Glaucoma Neg Hx   . Macular degeneration Neg Hx   . Retinal detachment Neg Hx   . Strabismus Neg Hx   . Retinitis pigmentosa Neg Hx     BP (!) 150/92   Ht '5\' 2"'  (1.575 m)   Wt 164 lb (74.4 kg)   LMP  (LMP Unknown)   BMI 30.00 kg/m   Review of Systems: See HPI above.     Objective:  Physical Exam:  Gen: NAD, comfortable in exam room  Left foot/ankle: Minimal dorsolateral swelling of foot.  No bruising, other deformity. FROM with 5/5 strength all directions TTP over 3rd-5th metatarsals, greatest over 5th.  No other tenderness. Negative ant drawer and talar tilt.   Negative syndesmotic compression. Thompsons test negative. NV intact distally.  MSK u/s left foot:  5th metatarsal stress fracture visible with early callus formation, edema overlying cortex.  No other abnormalities.  Assessment & Plan:  1. Left foot pain - 2/2 stress fracture of 5th metatarsal.  Early callus formation visible on ultrasound.  Advised to wear boot at all times.  Icing, elevation, ibuprofen if needed.  Tylenol, topical medications if needed.  F/u in 4 weeks.

## 2018-08-02 ENCOUNTER — Ambulatory Visit: Payer: Medicare HMO | Admitting: Podiatry

## 2018-08-02 DIAGNOSIS — M79674 Pain in right toe(s): Secondary | ICD-10-CM | POA: Diagnosis not present

## 2018-08-02 DIAGNOSIS — B351 Tinea unguium: Secondary | ICD-10-CM

## 2018-08-02 DIAGNOSIS — M79675 Pain in left toe(s): Secondary | ICD-10-CM

## 2018-08-02 DIAGNOSIS — L600 Ingrowing nail: Secondary | ICD-10-CM

## 2018-08-02 NOTE — Patient Instructions (Addendum)

## 2018-08-03 ENCOUNTER — Encounter: Payer: Self-pay | Admitting: Podiatry

## 2018-08-03 NOTE — Progress Notes (Signed)
Subjective: Crystal [name] presents today with painful, thick toenails 1-5 b/l that she cannot cut and which interfere with daily activities.  Pain is aggravated when wearing enclosed shoe gear.  [name], MD is her PCP and last visit was 02/23/2018.  Patient states she sustained a stress fracture of the left 5th metatarsal and she is now wearing a walking boot. She is under the care of Dr. Barbaraann Barthel for this injury.   Current Outpatient Medications:  .  ACCU-CHEK SOFTCLIX LANCETS lancets, Use as directed twice daily to check blood sugar. DX E11.9, Disp: 200 each, Rfl: 3 .  albuterol (VENTOLIN HFA) 108 (90 Base) MCG/ACT inhaler, INHALE 2 PUFFS BY MOUTH INTO THE LUNGS EVERY 4 (FOUR) HOURS AS NEEDED FOR SHORTNESS OF BREATH AND WHEEZING, Disp: 18 g, Rfl: 1 .  Alcohol Swabs (B-D SINGLE USE SWABS REGULAR) PADS, Use as directed to check blood sugar. DX E11.9, Disp: 100 each, Rfl: 3 .  Apoaequorin (PREVAGEN EXTRA STRENGTH PO), Take 1 capsule by mouth daily., Disp: , Rfl:  .  atenolol (TENORMIN) 25 MG tablet, Take 1 tablet (25 mg total) by mouth daily., Disp: 90 tablet, Rfl: 3 .  atorvastatin (LIPITOR) 80 MG tablet, TAKE 1 TABLET EVERY DAY, Disp: 90 tablet, Rfl: 0 .  Blood Glucose Calibration (ACCU-CHEK AVIVA) SOLN, Use as directed to check blood sugar.  DX E11.9, Disp: 1 each, Rfl: 3 .  Blood Glucose Monitoring Suppl (ACCU-CHEK AVIVA PLUS) w/Device KIT, USE AS DIRECTED TO CHECK BLOOD SUGAR., Disp: 1 kit, Rfl: 0 .  celecoxib (CELEBREX) 100 MG capsule, Take 1 capsule (100 mg total) by mouth daily., Disp: 60 capsule, Rfl: 0 .  cetirizine (ZYRTEC) 10 MG tablet, Take 1 tablet (10 mg total) by mouth daily as needed for allergies., Disp: 30 tablet, Rfl: 11 .  Cholecalciferol (VITAMIN D3) 2000 units TABS, Take 1 tablet by mouth daily., Disp: , Rfl:  .  dicyclomine (BENTYL) 10 MG capsule, TAKE 1 CAPSULE BY MOUTH EVERY 4 TO 6 HOURS AS NEEDED FOR ABDOMINAL PAIN AND CRAMPING OR DIARRHEA, Disp: 60 capsule,  Rfl: 1 .  diphenoxylate-atropine (LOMOTIL) 2.5-0.025 MG tablet, Take 1 tablet by mouth every morning. If continued diarrhea in the next 2-3 days may increase to twice daily., Disp: 30 tablet, Rfl: 3 .  donepezil (ARICEPT) 10 MG tablet, Take 1 tablet (10 mg total) by mouth at bedtime., Disp: 30 tablet, Rfl: 0 .  escitalopram (LEXAPRO) 20 MG tablet, Take 1 tablet (20 mg total) by mouth daily., Disp: 30 tablet, Rfl: 0 .  fenofibrate 160 MG tablet, TAKE 1 TABLET (160 MG TOTAL) BY MOUTH DAILY., Disp: 90 tablet, Rfl: 2 .  fluticasone (FLONASE) 50 MCG/ACT nasal spray, Place 2 sprays into both nostrils daily., Disp: 16 g, Rfl: 3 .  glucose blood (ACCU-CHEK AVIVA PLUS) test strip, Use as directed twice daily to check blood sugar.  DX E11.9, Disp: 200 each, Rfl: 3 .  ibuprofen (ADVIL,MOTRIN) 600 MG tablet, Take 1 tablet (600 mg total) by mouth every 6 (six) hours as needed., Disp: 30 tablet, Rfl: 1 .  ketorolac (ACULAR) 0.5 % ophthalmic solution, Place 1 drop into the right eye 4 (four) times daily. (Patient taking differently: Place 1 drop into both eyes 4 (four) times daily. ), Disp: 10 mL, Rfl: 0 .  levothyroxine (SYNTHROID, LEVOTHROID) 25 MCG tablet, TAKE 1 TABLET DAILY BEFORE BREAKFAST., Disp: 90 tablet, Rfl: 0 .  losartan (COZAAR) 50 MG tablet, TAKE 1 TABLET EVERY DAY, Disp: 90 tablet, Rfl:  0 .  meloxicam (MOBIC) 7.5 MG tablet, TAKE 1 TABLET (7.5 MG TOTAL) BY MOUTH DAILY., Disp: 30 tablet, Rfl: 0 .  montelukast (SINGULAIR) 10 MG tablet, TAKE 1 TABLET AT BEDTIME AS NEEDED FOR ALLERGIES, Disp: 30 tablet, Rfl: 0 .  Multiple Vitamins-Minerals (CENTRUM SILVER PO), Take 1 tablet by mouth daily.  , Disp: , Rfl:  .  pantoprazole (PROTONIX) 40 MG tablet, TAKE 1 TABLET EVERY DAY, Disp: 90 tablet, Rfl: 0 .  prednisoLONE acetate (PRED FORTE) 1 % ophthalmic suspension, Place 1 drop into the right eye 4 (four) times daily., Disp: 10 mL, Rfl: 0 .  Probiotic Product (ALIGN) 4 MG CAPS, Take 1 capsule by mouth daily.  Reported on 07/02/2015, Disp: , Rfl:  .  sitaGLIPtin (JANUVIA) 100 MG tablet, Take 1 tablet (100 mg total) by mouth daily., Disp: 90 tablet, Rfl: 1 .  triamcinolone cream (KENALOG) 0.1 %, Apply 1 application topically 2 (two) times daily. , Disp: , Rfl: 0  Allergies  Allergen Reactions  . Compazine [Prochlorperazine Edisylate]     Comma for 3 days  . Sulfa Antibiotics Itching    severe itching  . Tetanus Toxoid Other (See Comments)    convulsions    Objective:  Vascular Examination: Capillary refill time <3 seconds x 10 digits  Dorsalis pedis and Posterior tibial pulses palpable b/l  Digital hair present x 10 digits  Skin temperature gradient WNL b/l  Dermatological Examination: Skin with normal turgor, texture and tone b/l  Toenails 1-5 b/l discolored, thick, dystrophic with subungual debris and pain with palpation to nailbeds due to thickness of nails. Of note is incurvated nailplate left great toe with tenderness to palpation of the medial border. There is mild irritation. No flocculence, no abscess, no breaks in skin.  Musculoskeletal: Muscle strength 5/5 to all LE muscle groups  No gross bony deformities b/l.  No pain, crepitus or joint limitation noted with ROM.   Neurological: Sensation intact with 10 gram monofilament. Vibratory sensation intact.  Assessment: Painful onychomycosis toenails 1-5 b/l   Plan: 1. Toenails 1-5 b/l were debrided in length and girth without iatrogenic bleeding. Offending nail border debrided and curretaged left great toe. Patient noted relief post debridement/curretage. Triple antibiotic ointment applied to digit. She was given samples of Polysporin Ointment and instructed to apply to digit once daily for one week. 2. Patient to continue soft, supportive shoe gear right foot. She is under care of Dr. Barbaraann Barthel for stress fracture of the left 5th metatarsal. 3. Patient to report any pedal injuries to medical professional  immediately. 4. Follow up 3 months. Patient/POA to call should there be a concern in the interim.

## 2018-08-05 ENCOUNTER — Other Ambulatory Visit: Payer: Self-pay | Admitting: Family Medicine

## 2018-08-05 NOTE — Progress Notes (Signed)
Cardiology Office Note:    Date:  08/06/2018   ID:  Crystal [name], DOB [birth year], MRN [id number]  PCP:  [name], MD  Cardiologist:  Shirlee More, MD    Referring MD: [name], MD    ASSESSMENT:    1. Costochondral chest pain    PLAN:    In order of problems listed above:  1. 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  This.I think she requires any further cardiac ischemic evaluation.  Type 2 diabetes hyperlipidemia and hypertension are stable and she will continue current medical treatment.   Next appointment: As needed   Medication Adjustments/Labs and Tests Ordered: Current medicines are reviewed at length with the patient today.  Concerns regarding medicines are outlined above.  No orders of the defined types were placed in this encounter.  Meds ordered this encounter  Medications  . naproxen sodium (ALEVE) 220 MG tablet    Sig: Take 1 tablet (220 mg total) by mouth daily.    Dispense:  30 tablet    Refill:  0    Chief Complaint  Patient presents with  . Follow-up    History of Present Illness:    Crystal [name] is a 68 y.o. female with a hx of chest pain last seen by me 06/02/2018.  Prior to that 05/17/2018 she is seen in the emergency room with pleuritic chest pain and shortness of breath with 2 undetectable troponin levels EKG with minor nonspecific ST and T abnormality..  She subsequent had a submaximal pharmacologic myocardial perfusion study with normal perfusion and normal left ventricular function ejection fraction normal 55 to 65%.  Chest  CTA was performed and showed old healed fractures involving the anterior aspects of the bilateral third and fourth ribs and indeterminate pulmonary nodules  She was  last seen 07/08/18 with typical nonexertional localized reproducible on exam bilateral costochondritis and started on a NSAID trial. Compliance with diet, lifestyle and medications: Yes  She is improved resolution of chest pain with nonsteroidal anti-inflammatory drug.  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  Her other medical problems of hypertension hyperlipidemia and diabetes are stable and she has had no chest pain shortness of breath palpitation or syncope. Past Medical History:  Diagnosis Date  . Acute peptic ulcer of stomach    As a teenager  . Allergy    dust, cock roach,grass,weeds,molds  . Anxiety   . Arthritis    knees, hands, shoulders  . Asthma    02/08/10 FEV1 1.98 (80%), s/p saba 2.22 l/m (90%).nml  . Broken ankle    right foot, wearing a boot  . Cognitive change 06/10/2016  . Dementia ([place])   . Depression   . Diabetes mellitus 2003   Type II  . Diverticulosis   . Dyspepsia   . Fatty liver 12/19/2010  . GERD (gastroesophageal reflux disease)   . Hearing loss    bilateral hearing aids   . Herpes simplex virus (HSV) infection   . High cholesterol   . Hyperlipemia   . Hypertension   . Hypothyroidism 09/08/2013  . IBS (irritable bowel syndrome)   . Insomnia   . Internal hemorrhoids   .  Joint pain 08/08/2016  . Low back pain 08/10/2016  . OSA (obstructive sleep apnea)    Does not use CPAP - old one broken, new sleep study to be scheduled and then given a new CPAP machine  . Plantar fasciitis    no longer an issue  . Sun-damaged skin 12/09/2016  . SVD (spontaneous vaginal delivery)    x 4  . Vaginal Pap smear, abnormal   . Vitamin D deficiency 03/04/2016  . Wears partial dentures    upper dentures and lower partial    Past Surgical History:  Procedure Laterality Date  . APPENDECTOMY     age 22  . CATARACT EXTRACTION Bilateral 2016   Dr. Bing Plume    . CATARACT EXTRACTION, BILATERAL     12/8 and 12/14  . COLONOSCOPY  06/03/2010, 08/25/2011    severeal - polyps and diverticulosis   . DILATATION & CURETTAGE/HYSTEROSCOPY WITH MYOSURE N/A 04/01/2018   Procedure: DILATATION & CURETTAGE/HYSTEROSCOPY WITH MYOSURE POLYPECTOMY;  Surgeon: Emily Filbert, MD;  Location: [facility];  Service: Gynecology;  Laterality: N/A;  . EYE SURGERY     cateracts removed bilateral  . GASTRECTOMY     partial - ulcer as a teen  . HAND SURGERY Right    thumb   . HEMORRHOID BANDING    . KNEE ARTHROSCOPY Left 1998  . NASAL SEPTUM SURGERY    . RHINOPLASTY    . TOE SURGERY Right 1995    Current Medications: Current Meds  Medication Sig  . ACCU-CHEK SOFTCLIX LANCETS lancets Use as directed twice daily to check blood sugar. DX E11.9  . albuterol (VENTOLIN HFA) 108 (90 Base) MCG/ACT inhaler INHALE 2 PUFFS BY MOUTH INTO THE LUNGS EVERY 4 (FOUR) HOURS AS NEEDED FOR SHORTNESS OF BREATH AND WHEEZING  . Alcohol Swabs (B-D SINGLE USE SWABS REGULAR) PADS Use as directed to check blood sugar. DX E11.9  . Apoaequorin (PREVAGEN EXTRA STRENGTH PO) Take 1 capsule by mouth daily.  Marland Kitchen atenolol (TENORMIN) 25 MG tablet Take 1 tablet (25 mg total) by mouth daily.  Marland Kitchen atorvastatin (LIPITOR) 80 MG tablet TAKE 1 TABLET EVERY DAY  . Blood Glucose Calibration (ACCU-CHEK AVIVA) SOLN Use as directed to check blood sugar.  DX E11.9  . Blood Glucose Monitoring Suppl (ACCU-CHEK AVIVA PLUS) w/Device KIT USE AS DIRECTED TO CHECK BLOOD SUGAR.  . celecoxib (CELEBREX) 100 MG capsule Take 1 capsule (100 mg total) by mouth daily.  . cetirizine (ZYRTEC) 10 MG tablet Take 1 tablet (10 mg total) by mouth daily as needed for allergies.  . Cholecalciferol (VITAMIN D3) 2000 units TABS Take 1 tablet by mouth daily.  Marland Kitchen dicyclomine (BENTYL) 10 MG capsule TAKE 1 CAPSULE BY MOUTH EVERY 4 TO 6 HOURS AS NEEDED FOR ABDOMINAL PAIN AND CRAMPING OR DIARRHEA  . diphenoxylate-atropine (LOMOTIL) 2.5-0.025 MG tablet Take 1  tablet by mouth every morning. If continued diarrhea in the next 2-3 days may increase to twice daily.  Marland Kitchen donepezil (ARICEPT) 10 MG tablet Take 1 tablet (10 mg total) by mouth at bedtime.  Marland Kitchen escitalopram (LEXAPRO) 20 MG tablet Take 1 tablet (20 mg total) by mouth daily.  . fenofibrate 160 MG tablet TAKE 1 TABLET (160 MG TOTAL) BY MOUTH DAILY.  . fluticasone (FLONASE) 50 MCG/ACT nasal spray Place 2 sprays into both nostrils daily.  Marland Kitchen glucose blood (ACCU-CHEK AVIVA PLUS) test strip Use as directed twice daily to check blood sugar.  DX E11.9  . ibuprofen (ADVIL,MOTRIN) 600 MG tablet Take 1 tablet (600  mg total) by mouth every 6 (six) hours as needed.  Marland Kitchen ketorolac (ACULAR) 0.5 % ophthalmic solution Place 1 drop into the right eye 4 (four) times daily. (Patient taking differently: Place 1 drop into both eyes 4 (four) times daily. )  . levothyroxine (SYNTHROID, LEVOTHROID) 25 MCG tablet TAKE 1 TABLET DAILY BEFORE BREAKFAST.  Marland Kitchen losartan (COZAAR) 50 MG tablet TAKE 1 TABLET EVERY DAY  . meloxicam (MOBIC) 7.5 MG tablet TAKE 1 TABLET (7.5 MG TOTAL) BY MOUTH DAILY.  . montelukast (SINGULAIR) 10 MG tablet TAKE 1 TABLET (10 MG TOTAL) BY MOUTH AT BEDTIME AS NEEDED (ALLERGIES).  . Multiple Vitamins-Minerals (CENTRUM SILVER PO) Take 1 tablet by mouth daily.    . pantoprazole (PROTONIX) 40 MG tablet TAKE 1 TABLET EVERY DAY  . prednisoLONE acetate (PRED FORTE) 1 % ophthalmic suspension Place 1 drop into the right eye 4 (four) times daily.  . Probiotic Product (ALIGN) 4 MG CAPS Take 1 capsule by mouth daily. Reported on 07/02/2015  . sitaGLIPtin (JANUVIA) 100 MG tablet Take 1 tablet (100 mg total) by mouth daily.  Marland Kitchen triamcinolone cream (KENALOG) 0.1 % Apply 1 application topically 2 (two) times daily.      Allergies:   Compazine [prochlorperazine edisylate]; Sulfa antibiotics; and Tetanus toxoid   Social History   Socioeconomic History  . Marital status: Widowed    Spouse name: Not on file  . Number of  children: 4  . Years of education: Not on file  . Highest education level: Not on file  Occupational History  . Occupation: retired    Fish farm manager: [employer]  Social Needs  . Financial resource strain: Not on file  . Food insecurity:    Worry: Not on file    Inability: Not on file  . Transportation needs:    Medical: Not on file    Non-medical: Not on file  Tobacco Use  . Smoking status: Never Smoker  . Smokeless tobacco: Never Used  Substance and Sexual Activity  . Alcohol use: Yes    Alcohol/week: 0.0 standard drinks    Comment: Rare occasions   . Drug use: No  . Sexual activity: Not Currently    Birth control/protection: Post-menopausal  Lifestyle  . Physical activity:    Days per week: Not on file    Minutes per session: Not on file  . Stress: Not on file  Relationships  . Social connections:    Talks on phone: Not on file    Gets together: Not on file    Attends religious service: Not on file    Active member of club or organization: Not on file    Attends meetings of clubs or organizations: Not on file    Relationship status: Not on file  Other Topics Concern  . Not on file  Social History Narrative   No caffeine drinks daily      Family History: The patient's family history includes Asthma in her maternal grandmother; Cancer in her brother, sister, and sister; Colon cancer in her sister; Colon polyps in her sister; Diabetes in her mother; Heart attack (age of onset: 60) in her mother; Heart defect in an other family member; Heart disease in her sister; Hyperlipidemia in her mother; Mental illness in her son; Other in an other family member; Parkinsonism in her father; Stroke in her mother; Ulcers in an other family member. There is no history of Amblyopia, Blindness, Cataracts, Glaucoma, Macular degeneration, Retinal detachment, Strabismus, or Retinitis pigmentosa. ROS:   Please see the  history of present illness.    All other systems reviewed and are  negative.  EKGs/Labs/Other Studies Reviewed:    The following studies were reviewed today  Recent Labs: 02/23/2018: ALT 20; TSH 4.79 06/02/2018: BUN 13; Creatinine, Ser 0.81; Hemoglobin 12.8; Platelets 332; Potassium 4.2; Sodium 145  Recent Lipid Panel    Component Value Date/Time   CHOL 142 02/23/2018 1512   TRIG 124.0 02/23/2018 1512   HDL 51.70 02/23/2018 1512   CHOLHDL 3 02/23/2018 1512   VLDL 24.8 02/23/2018 1512   LDLCALC 66 02/23/2018 1512   LDLDIRECT 123.0 09/18/2014 1344    Physical Exam:    VS:  BP (!) 142/70 (BP Location: Left Arm, Patient Position: Sitting, Cuff Size: Normal)   Pulse 67   Ht '5\' 2"'  (1.575 m)   Wt 156 lb 1.9 oz (70.8 kg)   LMP  (LMP Unknown)   SpO2 97%   BMI 28.55 kg/m     Wt Readings from Last 3 Encounters:  08/06/18 156 lb 1.9 oz (70.8 kg)  07/29/18 164 lb (74.4 kg)  07/15/18 162 lb (73.5 kg)     GEN:  [name] nourished, [name] developed in no acute distress HEENT: Normal NECK: No JVD; No carotid bruits LYMPHATICS: No lymphadenopathy CARDIAC: No chest wall tenderness RRR, no murmurs, rubs, gallops RESPIRATORY:  Clear to auscultation without rales, wheezing or rhonchi  ABDOMEN: Soft, non-tender, non-distended MUSCULOSKELETAL:  No edema; No deformity  SKIN: Warm and dry NEUROLOGIC:  Alert and oriented x 3 PSYCHIATRIC:  Normal affect    Signed, Shirlee More, MD  08/06/2018 5:48 PM    [facility]

## 2018-08-06 ENCOUNTER — Encounter (INDEPENDENT_AMBULATORY_CARE_PROVIDER_SITE_OTHER): Payer: Medicare HMO | Admitting: Ophthalmology

## 2018-08-06 ENCOUNTER — Encounter: Payer: Self-pay | Admitting: Cardiology

## 2018-08-06 ENCOUNTER — Ambulatory Visit (INDEPENDENT_AMBULATORY_CARE_PROVIDER_SITE_OTHER): Payer: Medicare HMO | Admitting: Cardiology

## 2018-08-06 VITALS — BP 142/70 | HR 67 | Ht 62.0 in | Wt 156.1 lb

## 2018-08-06 DIAGNOSIS — R0789 Other chest pain: Secondary | ICD-10-CM

## 2018-08-06 DIAGNOSIS — R071 Chest pain on breathing: Secondary | ICD-10-CM

## 2018-08-06 MED ORDER — NAPROXEN SODIUM 220 MG PO TABS: 220.0000 mg | ORAL_TABLET | Freq: Every day | ORAL | 0 refills | Status: DC

## 2018-08-06 NOTE — Telephone Encounter (Signed)
Dr Charlett Blake -- please advise valacyclovir request?  Medication is no longer on current med list and last Rx was 11/2016 and notation was made to "stop taking at discharge".  Please advise?

## 2018-08-06 NOTE — Patient Instructions (Addendum)
Medication Instructions:  Your physician has recommended you make the following change in your medication:  START:  Aleve 220 mg (naproxen) over the counter daily.  If you need a refill on your cardiac medications before your next appointment, please call your pharmacy.   Lab work: NONE If you have labs (blood work) drawn today and your tests are completely normal, you will receive your results only by: Marland Kitchen MyChart Message (if you have MyChart) OR . A paper copy in the mail If you have any lab test that is abnormal or we need to change your treatment, we will call you to review the results.  Testing/Procedures: NONE  Follow-Up: At [facility], you and your health needs are our priority.  As part of our continuing mission to provide you with exceptional heart care, we have created designated Provider Care Teams.  These Care Teams include your primary Cardiologist (physician) and Advanced Practice Providers (APPs -  Physician Assistants and Nurse Practitioners) who all work together to provide you with the care you need, when you need it.  You will follow up with our office if symptoms worsen or fail to improve.

## 2018-08-13 ENCOUNTER — Ambulatory Visit (INDEPENDENT_AMBULATORY_CARE_PROVIDER_SITE_OTHER): Payer: Medicare HMO | Admitting: Ophthalmology

## 2018-08-13 ENCOUNTER — Encounter (INDEPENDENT_AMBULATORY_CARE_PROVIDER_SITE_OTHER): Payer: Self-pay | Admitting: Ophthalmology

## 2018-08-13 DIAGNOSIS — H35351 Cystoid macular degeneration, right eye: Secondary | ICD-10-CM

## 2018-08-13 DIAGNOSIS — H43813 Vitreous degeneration, bilateral: Secondary | ICD-10-CM

## 2018-08-13 DIAGNOSIS — H33331 Multiple defects of retina without detachment, right eye: Secondary | ICD-10-CM

## 2018-08-13 DIAGNOSIS — E119 Type 2 diabetes mellitus without complications: Secondary | ICD-10-CM

## 2018-08-13 DIAGNOSIS — H35371 Puckering of macula, right eye: Secondary | ICD-10-CM | POA: Diagnosis not present

## 2018-08-13 DIAGNOSIS — Z961 Presence of intraocular lens: Secondary | ICD-10-CM | POA: Diagnosis not present

## 2018-08-13 DIAGNOSIS — H3581 Retinal edema: Secondary | ICD-10-CM

## 2018-08-13 NOTE — Progress Notes (Signed)
[facility] Note  08/13/2018     CHIEF COMPLAINT Patient presents for Retina Follow Up   HISTORY OF PRESENT ILLNESS: Crystal [name] is a 68 y.o. female who presents to the clinic today for:   HPI    Retina Follow Up    Patient presents with  Retinal Break/Detachment.  In right eye.  This started 3 months ago.  Severity is moderate.  Duration of 6 weeks.  Since onset it is stable.  I, the attending physician,  performed the HPI with the patient and updated documentation appropriately.          Comments    68 y/o female pt here for 6 wk f/u for multiple retinal tears OD.  No change in [place] OU.  Denies pain, flashes, floaters.  No gtts.  BS 115 this a.m.  A1C 7.1 7 wks ago.       Last edited by Bernarda Caffey, MD on 08/13/2018  2:15 PM. (History)      Referring physician: [name], MD [address] [place], [place] [address]  HISTORICAL INFORMATION:   Selected notes from the [facility] Referral from Dr. Bing Plume for retina eval: - last exam by [facility], 03/17/17 - 2 wk history of floaters OU - hx of DM2 since 2003 - pseudophakia OU (complex CEIOL OD, 12.14.16, OS: 11.28.16)   CURRENT MEDICATIONS: Current Outpatient Medications (Ophthalmic Drugs)  Medication Sig  . ketorolac (ACULAR) 0.5 % ophthalmic solution Place 1 drop into the right eye 4 (four) times daily. (Patient taking differently: Place 1 drop into both eyes 4 (four) times daily. )  . prednisoLONE acetate (PRED FORTE) 1 % ophthalmic suspension Place 1 drop into the right eye 4 (four) times daily.   No current facility-administered medications for this visit.  (Ophthalmic Drugs)   Current Outpatient Medications (Other)  Medication Sig  . ACCU-CHEK SOFTCLIX LANCETS lancets Use as directed twice daily to check blood sugar. DX E11.9  . albuterol (VENTOLIN HFA) 108 (90 Base) MCG/ACT inhaler INHALE 2 PUFFS BY MOUTH INTO THE LUNGS EVERY 4 (FOUR) HOURS AS NEEDED FOR  SHORTNESS OF BREATH AND WHEEZING  . Alcohol Swabs (B-D SINGLE USE SWABS REGULAR) PADS Use as directed to check blood sugar. DX E11.9  . amoxicillin (AMOXIL) 500 MG capsule   . Apoaequorin (PREVAGEN EXTRA STRENGTH PO) Take 1 capsule by mouth daily.  Marland Kitchen atenolol (TENORMIN) 25 MG tablet Take 1 tablet (25 mg total) by mouth daily.  Marland Kitchen atorvastatin (LIPITOR) 80 MG tablet TAKE 1 TABLET EVERY DAY  . Blood Glucose Calibration (ACCU-CHEK AVIVA) SOLN Use as directed to check blood sugar.  DX E11.9  . Blood Glucose Monitoring Suppl (ACCU-CHEK AVIVA PLUS) w/Device KIT USE AS DIRECTED TO CHECK BLOOD SUGAR.  . celecoxib (CELEBREX) 100 MG capsule Take 1 capsule (100 mg total) by mouth daily.  . cetirizine (ZYRTEC) 10 MG tablet Take 1 tablet (10 mg total) by mouth daily as needed for allergies.  . Cholecalciferol (VITAMIN D3) 2000 units TABS Take 1 tablet by mouth daily.  Marland Kitchen dicyclomine (BENTYL) 10 MG capsule TAKE 1 CAPSULE BY MOUTH EVERY 4 TO 6 HOURS AS NEEDED FOR ABDOMINAL PAIN AND CRAMPING OR DIARRHEA  . diphenoxylate-atropine (LOMOTIL) 2.5-0.025 MG tablet Take 1 tablet by mouth every morning. If continued diarrhea in the next 2-3 days may increase to twice daily.  Marland Kitchen donepezil (ARICEPT) 10 MG tablet Take 1 tablet (10 mg total) by mouth at bedtime.  Marland Kitchen  escitalopram (LEXAPRO) 20 MG tablet Take 1 tablet (20 mg total) by mouth daily.  . fenofibrate 160 MG tablet TAKE 1 TABLET (160 MG TOTAL) BY MOUTH DAILY.  . fluticasone (FLONASE) 50 MCG/ACT nasal spray Place 2 sprays into both nostrils daily.  Marland Kitchen glucose blood (ACCU-CHEK AVIVA PLUS) test strip Use as directed twice daily to check blood sugar.  DX E11.9  . ibuprofen (ADVIL,MOTRIN) 600 MG tablet Take 1 tablet (600 mg total) by mouth every 6 (six) hours as needed.  Marland Kitchen levothyroxine (SYNTHROID, LEVOTHROID) 25 MCG tablet TAKE 1 TABLET DAILY BEFORE BREAKFAST.  Marland Kitchen losartan (COZAAR) 50 MG tablet TAKE 1 TABLET EVERY DAY  . meloxicam (MOBIC) 7.5 MG tablet TAKE 1 TABLET (7.5 MG  TOTAL) BY MOUTH DAILY.  . montelukast (SINGULAIR) 10 MG tablet TAKE 1 TABLET (10 MG TOTAL) BY MOUTH AT BEDTIME AS NEEDED (ALLERGIES).  . Multiple Vitamins-Minerals (CENTRUM SILVER PO) Take 1 tablet by mouth daily.    . naproxen sodium (ALEVE) 220 MG tablet Take 1 tablet (220 mg total) by mouth daily.  . pantoprazole (PROTONIX) 40 MG tablet TAKE 1 TABLET EVERY DAY  . Probiotic Product (ALIGN) 4 MG CAPS Take 1 capsule by mouth daily. Reported on 07/02/2015  . sitaGLIPtin (JANUVIA) 100 MG tablet Take 1 tablet (100 mg total) by mouth daily.  Marland Kitchen triamcinolone cream (KENALOG) 0.1 % Apply 1 application topically 2 (two) times daily.   . valACYclovir (VALTREX) 1000 MG tablet TAKE 1 TABLET (1,000 MG TOTAL) BY MOUTH 2 TIMES DAILY.   No current facility-administered medications for this visit.  (Other)      REVIEW OF SYSTEMS: ROS    Positive for: Gastrointestinal, Musculoskeletal, Eyes, Respiratory, Psychiatric   Negative for: Constitutional, Neurological, Skin, Genitourinary, HENT, Endocrine, Cardiovascular, Allergic/Imm, Heme/Lymph   Last edited by Matthew Folks, COA on 08/13/2018  2:03 PM. (History)       ALLERGIES Allergies  Allergen Reactions  . Compazine [Prochlorperazine Edisylate]     Comma for 3 days  . Sulfa Antibiotics Itching    severe itching  . Tetanus Toxoid Other (See Comments)    convulsions    PAST MEDICAL HISTORY Past Medical History:  Diagnosis Date  . Acute peptic ulcer of stomach    As a teenager  . Allergy    dust, cock roach,grass,weeds,molds  . Anxiety   . Arthritis    knees, hands, shoulders  . Asthma    02/08/10 FEV1 1.98 (80%), s/p saba 2.22 l/m (90%).nml  . Broken ankle    right foot, wearing a boot  . Cognitive change 06/10/2016  . Dementia ([place])   . Depression   . Diabetes mellitus 2003   Type II  . Diverticulosis   . Dyspepsia   . Fatty liver 12/19/2010  . GERD (gastroesophageal reflux disease)   . Hearing loss    bilateral hearing aids    . Herpes simplex virus (HSV) infection   . High cholesterol   . Hyperlipemia   . Hypertension   . Hypothyroidism 09/08/2013  . IBS (irritable bowel syndrome)   . Insomnia   . Internal hemorrhoids   . Joint pain 08/08/2016  . Low back pain 08/10/2016  . OSA (obstructive sleep apnea)    Does not use CPAP - old one broken, new sleep study to be scheduled and then given a new CPAP machine  . Plantar fasciitis    no longer an issue  . Sun-damaged skin 12/09/2016  . SVD (spontaneous vaginal delivery)    x  4  . Vaginal Pap smear, abnormal   . Vitamin D deficiency 03/04/2016  . Wears partial dentures    upper dentures and lower partial   Past Surgical History:  Procedure Laterality Date  . APPENDECTOMY     age 17  . CATARACT EXTRACTION Bilateral 2016   Dr. Bing Plume  . CATARACT EXTRACTION, BILATERAL     12/8 and 12/14  . COLONOSCOPY  06/03/2010, 08/25/2011    severeal - polyps and diverticulosis   . DILATATION & CURETTAGE/HYSTEROSCOPY WITH MYOSURE N/A 04/01/2018   Procedure: DILATATION & CURETTAGE/HYSTEROSCOPY WITH MYOSURE POLYPECTOMY;  Surgeon: Emily Filbert, MD;  Location: [facility];  Service: Gynecology;  Laterality: N/A;  . EYE SURGERY     cateracts removed bilateral  . GASTRECTOMY     partial - ulcer as a teen  . HAND SURGERY Right    thumb   . HEMORRHOID BANDING    . KNEE ARTHROSCOPY Left 1998  . NASAL SEPTUM SURGERY    . RHINOPLASTY    . TOE SURGERY Right 1995    FAMILY HISTORY Family History  Problem Relation Age of Onset  . Diabetes Mother        type II  . Heart attack Mother 30  . Stroke Mother   . Hyperlipidemia Mother   . Colon cancer Sister   . Cancer Sister        GI  . Heart disease Sister   . Asthma Maternal Grandmother   . Mental illness Son        PTSD from [employer]  . Parkinsonism Father   . Cancer Sister        stage 4 bladder cancer  . Colon polyps Sister   . Ulcers Other        bleeding gastric  . Heart defect Other        child  . Other Other         perforated bowel, child  . Cancer Brother        pancreatic cancer  . Amblyopia Neg Hx   . Blindness Neg Hx   . Cataracts Neg Hx   . Glaucoma Neg Hx   . Macular degeneration Neg Hx   . Retinal detachment Neg Hx   . Strabismus Neg Hx   . Retinitis pigmentosa Neg Hx     SOCIAL HISTORY Social History   Tobacco Use  . Smoking status: Never Smoker  . Smokeless tobacco: Never Used  Substance Use Topics  . Alcohol use: Yes    Alcohol/week: 0.0 standard drinks    Comment: Rare occasions   . Drug use: No         OPHTHALMIC EXAM:  Base Eye Exam    Visual Acuity (Snellen - Linear)      Right Left   Dist cc 20/40 20/25   Dist ph cc NI NI   Correction:  Glasses       Tonometry (Tonopen, 2:06 PM)      Right Left   Pressure 12 14       Pupils      Dark Light Shape React APD   Right 4 3 Irregular Brisk None   Left 4 3 Round Brisk None       Visual Fields (Counting fingers)      Left Right    Full Full       Extraocular Movement      Right Left    Full, Ortho Full, Ortho  Neuro/Psych    Oriented x3:  Yes   Mood/Affect:  Normal       Dilation    Both eyes:  1.0% Mydriacyl, 2.5% Phenylephrine @ 2:06 PM        Slit Lamp and Fundus Exam    External Exam      Right Left   External Normal Normal       Slit Lamp Exam      Right Left   Lids/Lashes Dermatochalasis - upper lid  Dermatochalasis - upper lid temporally , Telangiectasia   Conjunctiva/Sclera White and quiet White and quiet   Cornea 1+ Punctate epithelial erosions Well healed cataract wounds   Anterior Chamber Deep and clear Deep and quiet   Iris Round and dilated; 5.5 mm; TID at temporal pupil margin Round and dilated; 7.5 mm; PPM at 0600    Lens PC IOL in good position with open PC Posterior chamber intraocular lens - good position   Vitreous Vitreous syneresis Vitreous syneresis; Posterior vitreous detachment       Fundus Exam      Right Left   Disc +cupping, sharp rim, mild PPP  +cupping, mild tilt, PPP   C/D Ratio 0.7 0.7   Macula Blunted foveal reflex, Epiretinal membrane, no CME, no heme Blunted foveal reflex, Retinal pigment epithelial mottling, No heme or edema   Vessels Vascular attenuation - mild Vascular attenuation - mild   Periphery flat; attached; trace RPE changes; hair line Retinal tears at 3 and 4 o'clock w/ good laser scars surrounding; peripheral cystoid degeneration; supratemporal cobblestoning  Flat; attached; trace RPE changes, peripheral cystoid degneration          IMAGING AND PROCEDURES  Imaging and Procedures for 04/30/17  OCT, Retina - OU - Both Eyes       Right Eye Quality was good. Central Foveal Thickness: 341. Progression has been stable. Findings include epiretinal membrane, no IRF, no SRF, normal foveal contour (CME resolved).   Left Eye Quality was good. Central Foveal Thickness: 273. Progression has been stable. Findings include normal foveal contour, no IRF, no SRF.   Notes Images taken, stored on drive   Diagnosis / Impression:  OD - NFP; +ERM; CME remains resolved OS - NFP; no IRF/SRF  Clinical management:  See below  Abbreviations: NFP - Normal foveal profile. CME - cystoid macular edema. PED - pigment epithelial detachment. IRF - intraretinal fluid. SRF - subretinal fluid. EZ - ellipsoid zone. ERM - epiretinal membrane. ORA - outer retinal atrophy. ORT - outer retinal tubulation. SRHM - subretinal hyper-reflective material                  ASSESSMENT/PLAN:    ICD-10-CM   1. 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 Posterior vitreous detachment of both eyes H43.813   3. Type 2 diabetes mellitus without complication, without long-term current use of insulin (HCC) E11.9   4. Pseudophakia of both eyes Z96.1   5. CME (cystoid macular edema), right H35.351   6. Retinal edema H35.81 OCT, Retina - OU - Both Eyes  7. Epiretinal membrane (ERM) of right eye H35.371     1. Peripheral  retinal tears OD  Small flap tears at ora, 0300 and 0400 oclock  s/p barrier laser retinopexy OD, 03/18/17 -- good laser in place  stable; No new RT/RD  2. PVD / vitreous syneresis OU  Discussed findings and prognosis  No RT or RD on 360 scleral depressed exam OS  Reviewed s/s  of RT/RD  Strict return precautions for any such RT/RD signs/symptoms  3. DM2 without retinopathy  On oral meds  Discussed importance of tight BG and BP control  4. Pseudophakia OU  IOLs in good position OU -- beautiful surgeries by Dr. Bing Plume  OD with history of anterior vitrectomy and 3 piece sulcus IOL  monitor  5,6. CME OD  Remains resolved now since 12.20.19 -- was on PF and ketorolac  Etiology could be multifactorial -- ERM, delayed post-cataract  completed PF taper   F/U 9 mos  7. Epiretinal membrane, right eye   The natural history, anatomy, potential for loss of vision, and treatment options including vitrectomy techniques and the complications of endophthalmitis, retinal detachment, vitreous hemorrhage and permanent vision loss discussed with the patient  Mild ERM OD  May have contributed to CME OD (#5)  Not yet surgical -- monitor for pucker / symptoms  F/u 9 mos     Ophthalmic Meds Ordered this visit:  No orders of the defined types were placed in this encounter.      Return in about 9 months (around 05/14/2019).  There are no Patient Instructions on file for this visit.   Explained the diagnoses, plan, and follow up with the patient and they expressed understanding.  Patient expressed understanding of the importance of proper follow up care.   This document serves as a record of services personally performed by Gardiner Sleeper, MD, PhD. It was created on their behalf by Ernest Mallick, OA, an ophthalmic assistant. The creation of this record is the provider's dictation and/or activities during the visit.    Electronically signed by: Ernest Mallick, OA 01.31.2020 2:24 PM     Gardiner Sleeper, M.D., Ph.D. Diseases & Surgery of the Retina and Vitreous [facility]  I have reviewed the above documentation for accuracy and completeness, and I agree with the above. Gardiner Sleeper, M.D., Ph.D. 08/13/18 2:24 PM      Abbreviations: M myopia (nearsighted); A astigmatism; H hyperopia (farsighted); P presbyopia; Mrx spectacle prescription;  CTL contact lenses; OD right eye; OS left eye; OU both eyes  XT exotropia; ET esotropia; PEK punctate epithelial keratitis; PEE punctate epithelial erosions; DES dry eye syndrome; MGD meibomian gland dysfunction; ATs artificial tears; PFAT's preservative free artificial tears; [place] nuclear sclerotic cataract; PSC posterior subcapsular cataract; ERM epi-retinal membrane; PVD posterior vitreous detachment; RD retinal detachment; DM diabetes mellitus; DR diabetic retinopathy; NPDR non-proliferative diabetic retinopathy; PDR proliferative diabetic retinopathy; CSME clinically significant macular edema; DME diabetic macular edema; dbh dot blot hemorrhages; CWS cotton wool spot; POAG primary open angle glaucoma; C/D cup-to-disc ratio; HVF humphrey visual field; GVF goldmann visual field; OCT optical coherence tomography; IOP intraocular pressure; BRVO Branch retinal vein occlusion; CRVO central retinal vein occlusion; CRAO central retinal artery occlusion; BRAO branch retinal artery occlusion; RT retinal tear; SB scleral buckle; PPV pars plana vitrectomy; VH Vitreous hemorrhage; PRP panretinal laser photocoagulation; IVK intravitreal kenalog; VMT vitreomacular traction; MH Macular hole;  NVD neovascularization of the disc; NVE neovascularization elsewhere; AREDS age related eye disease study; ARMD age related macular degeneration; POAG primary open angle glaucoma; EBMD epithelial/anterior basement membrane dystrophy; ACIOL anterior chamber intraocular lens; IOL intraocular lens; PCIOL posterior chamber intraocular lens; Phaco/IOL  phacoemulsification with intraocular lens placement; [place] photorefractive keratectomy; LASIK laser assisted in situ keratomileusis; HTN hypertension; DM diabetes mellitus; COPD chronic obstructive pulmonary disease

## 2018-08-27 ENCOUNTER — Ambulatory Visit: Payer: Medicare HMO | Admitting: Family Medicine

## 2018-08-27 ENCOUNTER — Telehealth: Payer: Self-pay | Admitting: Family Medicine

## 2018-08-27 ENCOUNTER — Encounter: Payer: Self-pay | Admitting: Family Medicine

## 2018-08-27 VITALS — BP 151/92 | HR 65 | Ht 63.0 in | Wt 154.0 lb

## 2018-08-27 DIAGNOSIS — M79672 Pain in left foot: Secondary | ICD-10-CM | POA: Diagnosis not present

## 2018-08-27 NOTE — Telephone Encounter (Signed)
Pt came in office stating is needing a letter indicating that her dog is needed to assist her at her home living. (pt states there is a copy on file that was done for her before- just needed to have an updated letter since pt is moving to another facility) Tel for Pt [phone]. Please advise.

## 2018-08-27 NOTE — Progress Notes (Signed)
PCP: [name], MD  Subjective:   HPI: Patient is a 68 y.o. female here for left foot pain.  1/2: Patient denies known injury or trauma. She reports about 3-4 days ago she started to get pain dorsolateral aspect of left foot. Pain up to 5/10 and sharp, worse with ambulation. Has only tried elevating with mild benefit. No skin changes, numbness.  1/16: Patient reports pain at 6/10 level at worst with ambulation, sharp. Not been adherent to wearing boot all the time - states she has to work so wearing non-slip shoes at work then puts but on other times. Has been taking ibuprofen, using topical biofreeze. No skin changes, numbness. Swelling has improved.  2/14: Patient reports she's doing well without any pain. Done wearing cam walker. No skin changes, other complaints.  Past Medical History:  Diagnosis Date  . Acute peptic ulcer of stomach    As a teenager  . Allergy    dust, cock roach,grass,weeds,molds  . Anxiety   . Arthritis    knees, hands, shoulders  . Asthma    02/08/10 FEV1 1.98 (80%), s/p saba 2.22 l/m (90%).nml  . Broken ankle    right foot, wearing a boot  . Cognitive change 06/10/2016  . Dementia ([place])   . Depression   . Diabetes mellitus 2003   Type II  . Diverticulosis   . Dyspepsia   . Fatty liver 12/19/2010  . GERD (gastroesophageal reflux disease)   . Hearing loss    bilateral hearing aids   . Herpes simplex virus (HSV) infection   . High cholesterol   . Hyperlipemia   . Hypertension   . Hypothyroidism 09/08/2013  . IBS (irritable bowel syndrome)   . Insomnia   . Internal hemorrhoids   . Joint pain 08/08/2016  . Low back pain 08/10/2016  . OSA (obstructive sleep apnea)    Does not use CPAP - old one broken, new sleep study to be scheduled and then given a new CPAP machine  . Plantar fasciitis    no longer an issue  . Sun-damaged skin 12/09/2016  . SVD (spontaneous vaginal delivery)    x 4  . Vaginal Pap smear, abnormal   . Vitamin D  deficiency 03/04/2016  . Wears partial dentures    upper dentures and lower partial    Current Outpatient Medications on File Prior to Visit  Medication Sig Dispense Refill  . ACCU-CHEK SOFTCLIX LANCETS lancets Use as directed twice daily to check blood sugar. DX E11.9 200 each 3  . albuterol (VENTOLIN HFA) 108 (90 Base) MCG/ACT inhaler INHALE 2 PUFFS BY MOUTH INTO THE LUNGS EVERY 4 (FOUR) HOURS AS NEEDED FOR SHORTNESS OF BREATH AND WHEEZING 18 g 1  . Alcohol Swabs (B-D SINGLE USE SWABS REGULAR) PADS Use as directed to check blood sugar. DX E11.9 100 each 3  . amoxicillin (AMOXIL) 500 MG capsule     . Apoaequorin (PREVAGEN EXTRA STRENGTH PO) Take 1 capsule by mouth daily.    Marland Kitchen atenolol (TENORMIN) 25 MG tablet Take 1 tablet (25 mg total) by mouth daily. 90 tablet 3  . atorvastatin (LIPITOR) 80 MG tablet TAKE 1 TABLET EVERY DAY 90 tablet 0  . Blood Glucose Calibration (ACCU-CHEK AVIVA) SOLN Use as directed to check blood sugar.  DX E11.9 1 each 3  . Blood Glucose Monitoring Suppl (ACCU-CHEK AVIVA PLUS) w/Device KIT USE AS DIRECTED TO CHECK BLOOD SUGAR. 1 kit 0  . celecoxib (CELEBREX) 100 MG capsule Take 1 capsule (100  mg total) by mouth daily. 60 capsule 0  . cetirizine (ZYRTEC) 10 MG tablet Take 1 tablet (10 mg total) by mouth daily as needed for allergies. 30 tablet 11  . Cholecalciferol (VITAMIN D3) 2000 units TABS Take 1 tablet by mouth daily.    Marland Kitchen dicyclomine (BENTYL) 10 MG capsule TAKE 1 CAPSULE BY MOUTH EVERY 4 TO 6 HOURS AS NEEDED FOR ABDOMINAL PAIN AND CRAMPING OR DIARRHEA 60 capsule 1  . diphenoxylate-atropine (LOMOTIL) 2.5-0.025 MG tablet Take 1 tablet by mouth every morning. If continued diarrhea in the next 2-3 days may increase to twice daily. 30 tablet 3  . donepezil (ARICEPT) 10 MG tablet Take 1 tablet (10 mg total) by mouth at bedtime. 30 tablet 0  . escitalopram (LEXAPRO) 20 MG tablet Take 1 tablet (20 mg total) by mouth daily. 30 tablet 0  . fenofibrate 160 MG tablet TAKE 1  TABLET (160 MG TOTAL) BY MOUTH DAILY. 90 tablet 2  . fluticasone (FLONASE) 50 MCG/ACT nasal spray Place 2 sprays into both nostrils daily. 16 g 3  . glucose blood (ACCU-CHEK AVIVA PLUS) test strip Use as directed twice daily to check blood sugar.  DX E11.9 200 each 3  . ibuprofen (ADVIL,MOTRIN) 600 MG tablet Take 1 tablet (600 mg total) by mouth every 6 (six) hours as needed. 30 tablet 1  . ketorolac (ACULAR) 0.5 % ophthalmic solution Place 1 drop into the right eye 4 (four) times daily. (Patient taking differently: Place 1 drop into both eyes 4 (four) times daily. ) 10 mL 0  . levothyroxine (SYNTHROID, LEVOTHROID) 25 MCG tablet TAKE 1 TABLET DAILY BEFORE BREAKFAST. 90 tablet 0  . losartan (COZAAR) 50 MG tablet TAKE 1 TABLET EVERY DAY 90 tablet 0  . meloxicam (MOBIC) 7.5 MG tablet TAKE 1 TABLET (7.5 MG TOTAL) BY MOUTH DAILY. 30 tablet 0  . montelukast (SINGULAIR) 10 MG tablet TAKE 1 TABLET (10 MG TOTAL) BY MOUTH AT BEDTIME AS NEEDED (ALLERGIES). 30 tablet 3  . Multiple Vitamins-Minerals (CENTRUM SILVER PO) Take 1 tablet by mouth daily.      . naproxen sodium (ALEVE) 220 MG tablet Take 1 tablet (220 mg total) by mouth daily. 30 tablet 0  . pantoprazole (PROTONIX) 40 MG tablet TAKE 1 TABLET EVERY DAY 90 tablet 0  . prednisoLONE acetate (PRED FORTE) 1 % ophthalmic suspension Place 1 drop into the right eye 4 (four) times daily. 10 mL 0  . Probiotic Product (ALIGN) 4 MG CAPS Take 1 capsule by mouth daily. Reported on 07/02/2015    . sitaGLIPtin (JANUVIA) 100 MG tablet Take 1 tablet (100 mg total) by mouth daily. 90 tablet 1  . triamcinolone cream (KENALOG) 0.1 % Apply 1 application topically 2 (two) times daily.   0  . valACYclovir (VALTREX) 1000 MG tablet TAKE 1 TABLET (1,000 MG TOTAL) BY MOUTH 2 TIMES DAILY. 60 tablet 5   No current facility-administered medications on file prior to visit.     Past Surgical History:  Procedure Laterality Date  . APPENDECTOMY     age 38  . CATARACT EXTRACTION  Bilateral 2016   Dr. Bing Plume  . CATARACT EXTRACTION, BILATERAL     12/8 and 12/14  . COLONOSCOPY  06/03/2010, 08/25/2011    severeal - polyps and diverticulosis   . DILATATION & CURETTAGE/HYSTEROSCOPY WITH MYOSURE N/A 04/01/2018   Procedure: DILATATION & CURETTAGE/HYSTEROSCOPY WITH MYOSURE POLYPECTOMY;  Surgeon: Emily Filbert, MD;  Location: [facility];  Service: Gynecology;  Laterality: N/A;  . EYE SURGERY  cateracts removed bilateral  . GASTRECTOMY     partial - ulcer as a teen  . HAND SURGERY Right    thumb   . HEMORRHOID BANDING    . KNEE ARTHROSCOPY Left 1998  . NASAL SEPTUM SURGERY    . RHINOPLASTY    . TOE SURGERY Right 1995    Allergies  Allergen Reactions  . Compazine [Prochlorperazine Edisylate]     Comma for 3 days  . Sulfa Antibiotics Itching    severe itching  . Tetanus Toxoid Other (See Comments)    convulsions    Social History   Socioeconomic History  . Marital status: Widowed    Spouse name: Not on file  . Number of children: 4  . Years of education: Not on file  . Highest education level: Not on file  Occupational History  . Occupation: retired    Fish farm manager: [employer]  Social Needs  . Financial resource strain: Not on file  . Food insecurity:    Worry: Not on file    Inability: Not on file  . Transportation needs:    Medical: Not on file    Non-medical: Not on file  Tobacco Use  . Smoking status: Never Smoker  . Smokeless tobacco: Never Used  Substance and Sexual Activity  . Alcohol use: Yes    Alcohol/week: 0.0 standard drinks    Comment: Rare occasions   . Drug use: No  . Sexual activity: Not Currently    Birth control/protection: Post-menopausal  Lifestyle  . Physical activity:    Days per week: Not on file    Minutes per session: Not on file  . Stress: Not on file  Relationships  . Social connections:    Talks on phone: Not on file    Gets together: Not on file    Attends religious service: Not on file    Active member of club or  organization: Not on file    Attends meetings of clubs or organizations: Not on file    Relationship status: Not on file  . Intimate partner violence:    Fear of current or ex partner: Not on file    Emotionally abused: Not on file    Physically abused: Not on file    Forced sexual activity: Not on file  Other Topics Concern  . Not on file  Social History Narrative   No caffeine drinks daily     Family History  Problem Relation Age of Onset  . Diabetes Mother        type II  . Heart attack Mother 32  . Stroke Mother   . Hyperlipidemia Mother   . Colon cancer Sister   . Cancer Sister        GI  . Heart disease Sister   . Asthma Maternal Grandmother   . Mental illness Son        PTSD from [employer]  . Parkinsonism Father   . Cancer Sister        stage 4 bladder cancer  . Colon polyps Sister   . Ulcers Other        bleeding gastric  . Heart defect Other        child  . Other Other        perforated bowel, child  . Cancer Brother        pancreatic cancer  . Amblyopia Neg Hx   . Blindness Neg Hx   . Cataracts Neg Hx   . Glaucoma Neg Hx   .  Macular degeneration Neg Hx   . Retinal detachment Neg Hx   . Strabismus Neg Hx   . Retinitis pigmentosa Neg Hx     BP (!) 151/92   Pulse 65   Ht '5\' 3"'  (1.6 m)   Wt 154 lb (69.9 kg)   LMP  (LMP Unknown)   BMI 27.28 kg/m   Review of Systems: See HPI above.     Objective:  Physical Exam:  Gen: NAD, comfortable in exam room  Left foot/ankle: Palpable callus 5th metatarsal shaft.  No other gross deformity, swelling, ecchymoses FROM with 5/5 strength TTP minimally over callus NV intact distally.  Assessment & Plan:  1. Left foot pain - 2/2 stress fracture of 5th metatarsal.  Healed at this point; noted early callus formation on ultrasound 4 weeks ago.  Icing, ibuprofen if any soreness.  F/u prn.

## 2018-08-31 NOTE — Telephone Encounter (Signed)
Done  Mailed letter

## 2018-08-31 NOTE — Telephone Encounter (Signed)
Pt calling and states that she would like the letter mailed to her at the address on file.

## 2018-08-31 NOTE — Telephone Encounter (Signed)
Printed letter awaiting PCP signature Patient notified  Called patient left voicemail for patient wanting to know does she want me to mail her letter

## 2018-09-02 ENCOUNTER — Encounter: Payer: Self-pay | Admitting: Family

## 2018-09-02 ENCOUNTER — Ambulatory Visit (INDEPENDENT_AMBULATORY_CARE_PROVIDER_SITE_OTHER): Payer: Medicare HMO | Admitting: Family

## 2018-09-02 VITALS — BP 155/76 | HR 68 | Temp 98.4°F | Resp 16 | Wt 159.0 lb

## 2018-09-02 DIAGNOSIS — G629 Polyneuropathy, unspecified: Secondary | ICD-10-CM

## 2018-09-02 DIAGNOSIS — R0989 Other specified symptoms and signs involving the circulatory and respiratory systems: Secondary | ICD-10-CM

## 2018-09-02 DIAGNOSIS — I10 Essential (primary) hypertension: Secondary | ICD-10-CM

## 2018-09-02 LAB — B12 AND FOLATE PANEL
Folate: 19.6 ng/mL (ref >5.9)
Vitamin B-12: 543 pg/mL (ref 211–911)

## 2018-09-02 MED ORDER — HYDROCHLOROTHIAZIDE 25 MG PO TABS: 25.0000 mg | ORAL_TABLET | Freq: Every day | ORAL | 3 refills | Status: AC

## 2018-09-02 MED ORDER — GABAPENTIN 100 MG PO CAPS: 100.0000 mg | ORAL_CAPSULE | Freq: Three times a day (TID) | ORAL | 3 refills | Status: DC

## 2018-09-02 MED FILL — HYDROCHLOROTHIAZIDE 25 MG T: 25 | 30 days supply | Qty: 30 | Fill #0

## 2018-09-02 MED FILL — GABAPENTIN 100 MG CAPSULE: 100 | 30 days supply | Qty: 90 | Fill #0

## 2018-09-02 NOTE — Progress Notes (Signed)
Subjective:    Patient ID: Crystal [name], female    DOB: 10/03/1950, 68 y.o.   MRN: [id number]  HPI  Patient is a 68 year old female with longstanding hx of DM2 who presents today with two concerns:  1) LE edema- She notes swelling in both feet.  Now has sock lines which are worst at the end of the day. She is wondering if she may need a "water pill."  Wt Readings from Last 3 Encounters:  09/02/18 159 lb (72.1 kg)  08/27/18 154 lb (69.9 kg)  08/06/18 156 lb 1.9 oz (70.8 kg)    2) Numbness- Reports that this has been present for several years but has worsened the last several months.Occurs in both hands and both feet. Notes some LE pain in her feet which is worst at night.     HTN- currently maintained on atenolol and losartan.  BP Readings from Last 3 Encounters:  09/02/18 (!) 155/76  08/27/18 (!) 151/92  08/06/18 (!) 142/70       Review of Systems See HPI  Past Medical History:  Diagnosis Date  . Acute peptic ulcer of stomach    As a teenager  . Allergy    dust, cock roach,grass,weeds,molds  . Anxiety   . Arthritis    knees, hands, shoulders  . Asthma    02/08/10 FEV1 1.98 (80%), s/p saba 2.22 l/m (90%).nml  . Broken ankle    right foot, wearing a boot  . Cognitive change 06/10/2016  . Dementia ([place])   . Depression   . Diabetes mellitus 2003   Type II  . Diverticulosis   . Dyspepsia   . Fatty liver 12/19/2010  . GERD (gastroesophageal reflux disease)   . Hearing loss    bilateral hearing aids   . Herpes simplex virus (HSV) infection   . High cholesterol   . Hyperlipemia   . Hypertension   . Hypothyroidism 09/08/2013  . IBS (irritable bowel syndrome)   . Insomnia   . Internal hemorrhoids   . Joint pain 08/08/2016  . Low back pain 08/10/2016  . OSA (obstructive sleep apnea)    Does not use CPAP - old one broken, new sleep study to be scheduled and then given a new CPAP machine  . Plantar fasciitis    no longer an issue  . Sun-damaged skin  12/09/2016  . SVD (spontaneous vaginal delivery)    x 4  . Vaginal Pap smear, abnormal   . Vitamin D deficiency 03/04/2016  . Wears partial dentures    upper dentures and lower partial     Social History   Socioeconomic History  . Marital status: Widowed    Spouse name: Not on file  . Number of children: 4  . Years of education: Not on file  . Highest education level: Not on file  Occupational History  . Occupation: retired    Fish farm manager: [employer]  Social Needs  . Financial resource strain: Not on file  . Food insecurity:    Worry: Not on file    Inability: Not on file  . Transportation needs:    Medical: Not on file    Non-medical: Not on file  Tobacco Use  . Smoking status: Never Smoker  . Smokeless tobacco: Never Used  Substance and Sexual Activity  . Alcohol use: Yes    Alcohol/week: 0.0 standard drinks    Comment: Rare occasions   . Drug use: No  . Sexual activity: Not Currently    Birth control/protection:  Post-menopausal  Lifestyle  . Physical activity:    Days per week: Not on file    Minutes per session: Not on file  . Stress: Not on file  Relationships  . Social connections:    Talks on phone: Not on file    Gets together: Not on file    Attends religious service: Not on file    Active member of club or organization: Not on file    Attends meetings of clubs or organizations: Not on file    Relationship status: Not on file  . Intimate partner violence:    Fear of current or ex partner: Not on file    Emotionally abused: Not on file    Physically abused: Not on file    Forced sexual activity: Not on file  Other Topics Concern  . Not on file  Social History Narrative   No caffeine drinks daily     Past Surgical History:  Procedure Laterality Date  . APPENDECTOMY     age 2  . CATARACT EXTRACTION Bilateral 2016   Dr. Bing Plume  . CATARACT EXTRACTION, BILATERAL     12/8 and 12/14  . COLONOSCOPY  06/03/2010, 08/25/2011    severeal - polyps and  diverticulosis   . DILATATION & CURETTAGE/HYSTEROSCOPY WITH MYOSURE N/A 04/01/2018   Procedure: DILATATION & CURETTAGE/HYSTEROSCOPY WITH MYOSURE POLYPECTOMY;  Surgeon: Emily Filbert, MD;  Location: [facility];  Service: Gynecology;  Laterality: N/A;  . EYE SURGERY     cateracts removed bilateral  . GASTRECTOMY     partial - ulcer as a teen  . HAND SURGERY Right    thumb   . HEMORRHOID BANDING    . KNEE ARTHROSCOPY Left 1998  . NASAL SEPTUM SURGERY    . RHINOPLASTY    . TOE SURGERY Right 1995    Family History  Problem Relation Age of Onset  . Diabetes Mother        type II  . Heart attack Mother 63  . Stroke Mother   . Hyperlipidemia Mother   . Colon cancer Sister   . Cancer Sister        GI  . Heart disease Sister   . Asthma Maternal Grandmother   . Mental illness Son        PTSD from [employer]  . Parkinsonism Father   . Cancer Sister        stage 4 bladder cancer  . Colon polyps Sister   . Ulcers Other        bleeding gastric  . Heart defect Other        child  . Other Other        perforated bowel, child  . Cancer Brother        pancreatic cancer  . Amblyopia Neg Hx   . Blindness Neg Hx   . Cataracts Neg Hx   . Glaucoma Neg Hx   . Macular degeneration Neg Hx   . Retinal detachment Neg Hx   . Strabismus Neg Hx   . Retinitis pigmentosa Neg Hx     Allergies  Allergen Reactions  . Compazine [Prochlorperazine Edisylate]     Comma for 3 days  . Sulfa Antibiotics Itching    severe itching  . Tetanus Toxoid Other (See Comments)    convulsions    Current Outpatient Medications on File Prior to Visit  Medication Sig Dispense Refill  . ACCU-CHEK SOFTCLIX LANCETS lancets Use as directed twice daily to check blood sugar. DX  E11.9 200 each 3  . albuterol (VENTOLIN HFA) 108 (90 Base) MCG/ACT inhaler INHALE 2 PUFFS BY MOUTH INTO THE LUNGS EVERY 4 (FOUR) HOURS AS NEEDED FOR SHORTNESS OF BREATH AND WHEEZING 18 g 1  . Alcohol Swabs (B-D SINGLE USE SWABS REGULAR) PADS Use as  directed to check blood sugar. DX E11.9 100 each 3  . Apoaequorin (PREVAGEN EXTRA STRENGTH PO) Take 1 capsule by mouth daily.    Marland Kitchen atenolol (TENORMIN) 25 MG tablet Take 1 tablet (25 mg total) by mouth daily. 90 tablet 3  . atorvastatin (LIPITOR) 80 MG tablet TAKE 1 TABLET EVERY DAY 90 tablet 0  . Blood Glucose Calibration (ACCU-CHEK AVIVA) SOLN Use as directed to check blood sugar.  DX E11.9 1 each 3  . Blood Glucose Monitoring Suppl (ACCU-CHEK AVIVA PLUS) w/Device KIT USE AS DIRECTED TO CHECK BLOOD SUGAR. 1 kit 0  . celecoxib (CELEBREX) 100 MG capsule Take 1 capsule (100 mg total) by mouth daily. 60 capsule 0  . cetirizine (ZYRTEC) 10 MG tablet Take 1 tablet (10 mg total) by mouth daily as needed for allergies. 30 tablet 11  . Cholecalciferol (VITAMIN D3) 2000 units TABS Take 1 tablet by mouth daily.    Marland Kitchen dicyclomine (BENTYL) 10 MG capsule TAKE 1 CAPSULE BY MOUTH EVERY 4 TO 6 HOURS AS NEEDED FOR ABDOMINAL PAIN AND CRAMPING OR DIARRHEA 60 capsule 1  . diphenoxylate-atropine (LOMOTIL) 2.5-0.025 MG tablet Take 1 tablet by mouth every morning. If continued diarrhea in the next 2-3 days may increase to twice daily. 30 tablet 3  . donepezil (ARICEPT) 10 MG tablet Take 1 tablet (10 mg total) by mouth at bedtime. 30 tablet 0  . escitalopram (LEXAPRO) 20 MG tablet Take 1 tablet (20 mg total) by mouth daily. 30 tablet 0  . fenofibrate 160 MG tablet TAKE 1 TABLET (160 MG TOTAL) BY MOUTH DAILY. 90 tablet 2  . fluticasone (FLONASE) 50 MCG/ACT nasal spray Place 2 sprays into both nostrils daily. 16 g 3  . glucose blood (ACCU-CHEK AVIVA PLUS) test strip Use as directed twice daily to check blood sugar.  DX E11.9 200 each 3  . ibuprofen (ADVIL,MOTRIN) 600 MG tablet Take 1 tablet (600 mg total) by mouth every 6 (six) hours as needed. 30 tablet 1  . ketorolac (ACULAR) 0.5 % ophthalmic solution Place 1 drop into the right eye 4 (four) times daily. (Patient taking differently: Place 1 drop into both eyes 4 (four)  times daily. ) 10 mL 0  . levothyroxine (SYNTHROID, LEVOTHROID) 25 MCG tablet TAKE 1 TABLET DAILY BEFORE BREAKFAST. 90 tablet 0  . losartan (COZAAR) 50 MG tablet TAKE 1 TABLET EVERY DAY 90 tablet 0  . meloxicam (MOBIC) 7.5 MG tablet TAKE 1 TABLET (7.5 MG TOTAL) BY MOUTH DAILY. 30 tablet 0  . montelukast (SINGULAIR) 10 MG tablet TAKE 1 TABLET (10 MG TOTAL) BY MOUTH AT BEDTIME AS NEEDED (ALLERGIES). 30 tablet 3  . Multiple Vitamins-Minerals (CENTRUM SILVER PO) Take 1 tablet by mouth daily.      . naproxen sodium (ALEVE) 220 MG tablet Take 1 tablet (220 mg total) by mouth daily. 30 tablet 0  . pantoprazole (PROTONIX) 40 MG tablet TAKE 1 TABLET EVERY DAY 90 tablet 0  . prednisoLONE acetate (PRED FORTE) 1 % ophthalmic suspension Place 1 drop into the right eye 4 (four) times daily. 10 mL 0  . Probiotic Product (ALIGN) 4 MG CAPS Take 1 capsule by mouth daily. Reported on 07/02/2015    . sitaGLIPtin (  JANUVIA) 100 MG tablet Take 1 tablet (100 mg total) by mouth daily. 90 tablet 1  . triamcinolone cream (KENALOG) 0.1 % Apply 1 application topically 2 (two) times daily.   0  . valACYclovir (VALTREX) 1000 MG tablet TAKE 1 TABLET (1,000 MG TOTAL) BY MOUTH 2 TIMES DAILY. 60 tablet 5   No current facility-administered medications on file prior to visit.     BP (!) 155/76 (BP Location: Right Arm, Patient Position: Sitting, Cuff Size: Small)   Pulse 68   Temp 98.4 F (36.9 C) (Oral)   Resp 16   Wt 159 lb (72.1 kg)   LMP  (LMP Unknown)   SpO2 98%   BMI 28.17 kg/m       Objective:   Physical Exam Constitutional:      Appearance: She is well-developed.  Neck:     Musculoskeletal: Neck supple.     Thyroid: No thyromegaly.  Cardiovascular:     Rate and Rhythm: Normal rate and regular rhythm.     Pulses:          Dorsalis pedis pulses are 1+ on the right side and 1+ on the left side.       Posterior tibial pulses are 2+ on the right side and 1+ on the left side.     Heart sounds: Normal heart  sounds. No murmur.  Pulmonary:     Effort: Pulmonary effort is normal. No respiratory distress.     Breath sounds: Normal breath sounds. No wheezing.  Musculoskeletal:     Right lower leg: 1+ Edema present.     Left lower leg: 1+ Edema present.  Skin:    General: Skin is warm and dry.  Neurological:     Mental Status: She is alert and oriented to person, place, and time.     Comments: Diminished sensation to monofilament bilateral hands/feet  Psychiatric:        Behavior: Behavior normal.        Thought Content: Thought content normal.        Judgment: Judgment normal.           Assessment & Plan:  Peripheral neuropathy- check b12/folate.  Trial of gabapentin 115m tid.  HTN- bp elevated.  Will add hctz once daily to help with bp and LE edema. Follow up in 1-2 weeks for BP recheck with nurse and bmet.    Abnormal LE pulses- check bilateral LE ABI to rule out PVD.

## 2018-09-02 NOTE — Patient Instructions (Addendum)
Please begin HCTZ once daily for blood pressure and leg swelling.  Add gabapentin 3 times daily for neuropathy pain. Complete lab work prior to leaving.

## 2018-09-06 ENCOUNTER — Other Ambulatory Visit: Payer: Self-pay | Admitting: Family Medicine

## 2018-09-08 ENCOUNTER — Ambulatory Visit (HOSPITAL_COMMUNITY)
Admission: RE | Admit: 2018-09-08 | Discharge: 2018-09-08 | Disposition: A | Discharge status: Home / Self Care | Payer: Medicare HMO | Source: Ambulatory Visit | Attending: Vascular Surgery | Admitting: Vascular Surgery

## 2018-09-08 DIAGNOSIS — R0989 Other specified symptoms and signs involving the circulatory and respiratory systems: Secondary | ICD-10-CM | POA: Insufficient documentation

## 2018-09-09 ENCOUNTER — Ambulatory Visit (INDEPENDENT_AMBULATORY_CARE_PROVIDER_SITE_OTHER): Payer: Medicare HMO | Admitting: Family Medicine

## 2018-09-09 VITALS — BP 136/73 | HR 58

## 2018-09-09 DIAGNOSIS — I10 Essential (primary) hypertension: Secondary | ICD-10-CM | POA: Diagnosis not present

## 2018-09-09 NOTE — Progress Notes (Signed)
Patient her for follow up blood pressure per Debbrah Alar.    She saw her on 09/02/18 and blood pressure was 155/76.  She added hctz 25mg .  Patient also taking atenolol and losartan.  Has taken medications today.  No missed doses.  Blood pressure today:  136/73 Pulse: 58  Per Dr. Charlett Blake ok to keep taking atenolol, losartan, and hctz.  We will recheck BMP at next ov in May.    Left detailed message on voicemail of note from Dr. Charlett Blake as the patient could not wait in office.

## 2018-09-23 NOTE — Progress Notes (Deleted)
Subjective:   Crystal [name] is a 69 y.o. female who presents for Medicare Annual (Subsequent) preventive examination.  Review of Systems: No ROS.  Medicare Wellness Visit. Additional risk factors are reflected in the social history.   Sleep patterns:  Home Safety/Smoke Alarms: Feels safe in home. Smoke alarms in place.   Female:        Mammo- 02/25/18       Dexa scan-  10/09/18      CCS- 05/19/16. 5 yr recall    Objective:     Vitals: LMP  (LMP Unknown)   There is no height or weight on file to calculate BMI.  Advanced Directives 05/17/2018 04/01/2018 03/30/2018 03/18/2018 01/21/2017 10/06/2016 05/19/2016  Does Patient Have a Medical Advance Directive? No No No No No No No  Copy of Healthcare Power of Attorney in Chart? - - - - - - -  Would patient like information on creating a medical advance directive? - - Yes (MAU/Ambulatory/Procedural Areas - Information given) No - Patient declined No - Patient declined No - Patient declined -  Some encounter information is confidential and restricted. Go to Review Flowsheets activity to see all data.    Tobacco Social History   Tobacco Use  Smoking Status Never Smoker  Smokeless Tobacco Never Used     Counseling given: Not Answered   Clinical Intake:                       Past Medical History:  Diagnosis Date  . Acute peptic ulcer of stomach    As a teenager  . Allergy    dust, cock roach,grass,weeds,molds  . Anxiety   . Arthritis    knees, hands, shoulders  . Asthma    02/08/10 FEV1 1.98 (80%), s/p saba 2.22 l/m (90%).nml  . Broken ankle    right foot, wearing a boot  . Cognitive change 06/10/2016  . Dementia ([place])   . Depression   . Diabetes mellitus 2003   Type II  . Diverticulosis   . Dyspepsia   . Fatty liver 12/19/2010  . GERD (gastroesophageal reflux disease)   . Hearing loss    bilateral hearing aids   . Herpes simplex virus (HSV) infection   . High cholesterol   . Hyperlipemia   .  Hypertension   . Hypothyroidism 09/08/2013  . IBS (irritable bowel syndrome)   . Insomnia   . Internal hemorrhoids   . Joint pain 08/08/2016  . Low back pain 08/10/2016  . OSA (obstructive sleep apnea)    Does not use CPAP - old one broken, new sleep study to be scheduled and then given a new CPAP machine  . Plantar fasciitis    no longer an issue  . Sun-damaged skin 12/09/2016  . SVD (spontaneous vaginal delivery)    x 4  . Vaginal Pap smear, abnormal   . Vitamin D deficiency 03/04/2016  . Wears partial dentures    upper dentures and lower partial   Past Surgical History:  Procedure Laterality Date  . APPENDECTOMY     age 68  . CATARACT EXTRACTION Bilateral 2016   Dr. Bing Plume  . CATARACT EXTRACTION, BILATERAL     12/8 and 12/14  . COLONOSCOPY  06/03/2010, 08/25/2011    severeal - polyps and diverticulosis   . DILATATION & CURETTAGE/HYSTEROSCOPY WITH MYOSURE N/A 04/01/2018   Procedure: [place] POLYPECTOMY;  Surgeon: Emily Filbert, MD;  Location: [facility];  Service:  Gynecology;  Laterality: N/A;  . EYE SURGERY     cateracts removed bilateral  . GASTRECTOMY     partial - ulcer as a teen  . HAND SURGERY Right    thumb   . HEMORRHOID BANDING    . KNEE ARTHROSCOPY Left 1998  . NASAL SEPTUM SURGERY    . RHINOPLASTY    . TOE SURGERY Right 1995   Family History  Problem Relation Age of Onset  . Diabetes Mother        type II  . Heart attack Mother 1  . Stroke Mother   . Hyperlipidemia Mother   . Colon cancer Sister   . Cancer Sister        GI  . Heart disease Sister   . Asthma Maternal Grandmother   . Mental illness Son        PTSD from [employer]  . Parkinsonism Father   . Cancer Sister        stage 4 bladder cancer  . Colon polyps Sister   . Ulcers Other        bleeding gastric  . Heart defect Other        child  . Other Other        perforated bowel, child  . Cancer Brother        pancreatic cancer  . Amblyopia Neg Hx   .  Blindness Neg Hx   . Cataracts Neg Hx   . Glaucoma Neg Hx   . Macular degeneration Neg Hx   . Retinal detachment Neg Hx   . Strabismus Neg Hx   . Retinitis pigmentosa Neg Hx    Social History   Socioeconomic History  . Marital status: Widowed    Spouse name: Not on file  . Number of children: 4  . Years of education: Not on file  . Highest education level: Not on file  Occupational History  . Occupation: retired    Fish farm manager: [employer]  Social Needs  . Financial resource strain: Not on file  . Food insecurity:    Worry: Not on file    Inability: Not on file  . Transportation needs:    Medical: Not on file    Non-medical: Not on file  Tobacco Use  . Smoking status: Never Smoker  . Smokeless tobacco: Never Used  Substance and Sexual Activity  . Alcohol use: Yes    Alcohol/week: 0.0 standard drinks    Comment: Rare occasions   . Drug use: No  . Sexual activity: Not Currently    Birth control/protection: Post-menopausal  Lifestyle  . Physical activity:    Days per week: Not on file    Minutes per session: Not on file  . Stress: Not on file  Relationships  . Social connections:    Talks on phone: Not on file    Gets together: Not on file    Attends religious service: Not on file    Active member of club or organization: Not on file    Attends meetings of clubs or organizations: Not on file    Relationship status: Not on file  Other Topics Concern  . Not on file  Social History Narrative   No caffeine drinks daily     Outpatient Encounter Medications as of 09/24/2018  Medication Sig  . ACCU-CHEK SOFTCLIX LANCETS lancets Use as directed twice daily to check blood sugar. DX E11.9  . albuterol (VENTOLIN HFA) 108 (90 Base) MCG/ACT inhaler INHALE 2 PUFFS BY MOUTH INTO  THE LUNGS EVERY 4 (FOUR) HOURS AS NEEDED FOR SHORTNESS OF BREATH AND WHEEZING  . Alcohol Swabs (B-D SINGLE USE SWABS REGULAR) PADS Use as directed to check blood sugar. DX E11.9  . Apoaequorin (PREVAGEN EXTRA  STRENGTH PO) Take 1 capsule by mouth daily.  Marland Kitchen atenolol (TENORMIN) 25 MG tablet TAKE 1 TABLET EVERY DAY  . atorvastatin (LIPITOR) 80 MG tablet TAKE 1 TABLET EVERY DAY  . Blood Glucose Calibration (ACCU-CHEK AVIVA) SOLN Use as directed to check blood sugar.  DX E11.9  . Blood Glucose Monitoring Suppl (ACCU-CHEK AVIVA PLUS) w/Device KIT USE AS DIRECTED TO CHECK BLOOD SUGAR.  . celecoxib (CELEBREX) 100 MG capsule Take 1 capsule (100 mg total) by mouth daily.  . cetirizine (ZYRTEC) 10 MG tablet Take 1 tablet (10 mg total) by mouth daily as needed for allergies.  . Cholecalciferol (VITAMIN D3) 2000 units TABS Take 1 tablet by mouth daily.  Marland Kitchen dicyclomine (BENTYL) 10 MG capsule TAKE 1 CAPSULE BY MOUTH EVERY 4 TO 6 HOURS AS NEEDED FOR ABDOMINAL PAIN AND CRAMPING OR DIARRHEA  . diphenoxylate-atropine (LOMOTIL) 2.5-0.025 MG tablet Take 1 tablet by mouth every morning. If continued diarrhea in the next 2-3 days may increase to twice daily.  Marland Kitchen donepezil (ARICEPT) 10 MG tablet Take 1 tablet (10 mg total) by mouth at bedtime.  Marland Kitchen escitalopram (LEXAPRO) 20 MG tablet Take 1 tablet (20 mg total) by mouth daily.  . fenofibrate 160 MG tablet TAKE 1 TABLET (160 MG TOTAL) BY MOUTH DAILY.  . fluticasone (FLONASE) 50 MCG/ACT nasal spray Place 2 sprays into both nostrils daily.  Marland Kitchen gabapentin (NEURONTIN) 100 MG capsule Take 1 capsule (100 mg total) by mouth 3 (three) times daily.  Marland Kitchen glimepiride (AMARYL) 4 MG tablet TAKE 1 TABLET TWICE DAILY  . glucose blood (ACCU-CHEK AVIVA PLUS) test strip Use as directed twice daily to check blood sugar.  DX E11.9  . hydrochlorothiazide (HYDRODIURIL) 25 MG tablet Take 1 tablet (25 mg total) by mouth daily.  Marland Kitchen ibuprofen (ADVIL,MOTRIN) 600 MG tablet Take 1 tablet (600 mg total) by mouth every 6 (six) hours as needed.  Marland Kitchen ketorolac (ACULAR) 0.5 % ophthalmic solution Place 1 drop into the right eye 4 (four) times daily. (Patient taking differently: Place 1 drop into both eyes 4 (four) times  daily. )  . levothyroxine (SYNTHROID, LEVOTHROID) 25 MCG tablet TAKE 1 TABLET DAILY BEFORE BREAKFAST.  Marland Kitchen losartan (COZAAR) 50 MG tablet TAKE 1 TABLET EVERY DAY  . meloxicam (MOBIC) 7.5 MG tablet TAKE 1 TABLET (7.5 MG TOTAL) BY MOUTH DAILY.  . montelukast (SINGULAIR) 10 MG tablet TAKE 1 TABLET (10 MG TOTAL) BY MOUTH AT BEDTIME AS NEEDED (ALLERGIES).  . Multiple Vitamins-Minerals (CENTRUM SILVER PO) Take 1 tablet by mouth daily.    . naproxen sodium (ALEVE) 220 MG tablet Take 1 tablet (220 mg total) by mouth daily.  . pantoprazole (PROTONIX) 40 MG tablet TAKE 1 TABLET EVERY DAY  . prednisoLONE acetate (PRED FORTE) 1 % ophthalmic suspension Place 1 drop into the right eye 4 (four) times daily.  . Probiotic Product (ALIGN) 4 MG CAPS Take 1 capsule by mouth daily. Reported on 07/02/2015  . sitaGLIPtin (JANUVIA) 100 MG tablet Take 1 tablet (100 mg total) by mouth daily.  Marland Kitchen triamcinolone cream (KENALOG) 0.1 % Apply 1 application topically 2 (two) times daily.   . valACYclovir (VALTREX) 1000 MG tablet TAKE 1 TABLET (1,000 MG TOTAL) BY MOUTH 2 TIMES DAILY.   No facility-administered encounter medications on file as of  09/24/2018.     Activities of Daily Living In your present state of health, do you have any difficulty performing the following activities: 03/30/2018  Hearing? Y  Comment Bilateral hearing aids  Vision? N  Difficulty concentrating or making decisions? Y  Comment dementia  Walking or climbing stairs? N  Comment goes up slowly  Dressing or bathing? Y  Doing errands, shopping? N  Some recent data might be hidden    Patient Care Team: [name], MD as PCP - General (Family Medicine) Calvert Cantor, MD as Consulting Physician (Ophthalmology) Gatha Mayer, MD as Consulting Physician (Gastroenterology) Leinbach, Sol Blazing, MD as Referring Physician (Otolaryngology) Dene Gentry, MD as Consulting Physician (Sports Medicine) Rigoberto Noel, MD as Consulting  Physician (Pulmonary Disease) Sheard, Briscoe Burns, DPM (Inactive) as Consulting Physician (Podiatry) [place], C. Shanon Brow, MD as Consulting Physician (Otolaryngology)    Assessment:   This is a routine wellness examination for Crystal [name]. Physical assessment deferred to PCP.  Exercise Activities and Dietary recommendations   .awvd  Goals    . HEMOGLOBIN A1C < 7.0    . Prevent Falls    . Weight (lb) < 135 lb (61.2 kg)       Fall Risk Fall Risk  03/13/2017 12/09/2016 10/06/2016 08/12/2016 08/08/2016  Falls in the past year? No No No Yes No  Comment - - - - -  Number falls in past yr: - - - 2 or more -  Injury with Fall? - - - No -  Risk Factor Category  - - - - -  Follow up - - - - -   Depression Screen PHQ 2/9 Scores 12/09/2016 10/06/2016 08/08/2016 07/13/2015  PHQ - 2 Score 0 1 0 0     Cognitive Function MMSE - Mini Mental State Exam 10/06/2016 07/02/2015  Orientation to time 4 5  Orientation to Place 5 5  Registration 3 3  Attention/ Calculation 5 5  Recall 3 1  Language- name 2 objects 2 2  Language- repeat 1 1  Language- follow 3 step command 3 3  Language- read & follow direction 1 1  Write a sentence 1 1  Copy design 1 1  Total score 29 28   Montreal Cognitive Assessment  08/12/2016  Visuospatial/ Executive (0/5) 5  Naming (0/3) 3  Attention: Read list of digits (0/2) 2  Attention: Read list of letters (0/1) 1  Attention: Serial 7 subtraction starting at 100 (0/3) 3  Language: Repeat phrase (0/2) 2  Language : Fluency (0/1) 0  Abstraction (0/2) 2  Delayed Recall (0/5) 1  Orientation (0/6) 6  Total 25      Immunization History  Administered Date(s) Administered  . Influenza Split 05/26/2011, 03/31/2012  . Influenza Whole 03/27/2010  . Influenza, High Dose Seasonal PF 04/02/2017  . Influenza,inj,Quad PF,6+ Mos 03/29/2013, 04/13/2014, 04/02/2015, 04/23/2016, 03/16/2018  . Pneumococcal Conjugate-13 04/25/2013  . Pneumococcal Polysaccharide-23 07/14/2004, 08/03/2015  .  Zoster 12/27/2013    Screening Tests Health Maintenance  Topic Date Due  . OPHTHALMOLOGY EXAM  05/06/2016  . FOOT EXAM  10/06/2017  . HEMOGLOBIN A1C  12/22/2018  . MAMMOGRAM  02/26/2020  . COLONOSCOPY  05/19/2021  . TETANUS/TDAP  09/10/2021  . INFLUENZA VACCINE  Completed  . DEXA SCAN  Completed  . Hepatitis C Screening  Completed  . PNA vac Low Risk Adult  Completed       Plan:   ***   I have personally reviewed and noted the following  in the patient's chart:   . Medical and social history . Use of alcohol, tobacco or illicit drugs  . Current medications and supplements . Functional ability and status . Nutritional status . Physical activity . Advanced directives . List of other physicians . Hospitalizations, surgeries, and ER visits in previous 12 months . Vitals . Screenings to include cognitive, depression, and falls . Referrals and appointments  In addition, I have reviewed and discussed with patient certain preventive protocols, quality metrics, and best practice recommendations. A written personalized care plan for preventive services as well as general preventive health recommendations were provided to patient.     Shela Nevin, [place]  09/23/2018

## 2018-09-24 ENCOUNTER — Ambulatory Visit: Payer: Self-pay | Admitting: *Deleted

## 2018-09-27 ENCOUNTER — Encounter: Payer: Self-pay | Admitting: Family Medicine

## 2018-09-27 ENCOUNTER — Ambulatory Visit: Payer: Medicare HMO | Admitting: Family Medicine

## 2018-09-27 ENCOUNTER — Other Ambulatory Visit: Payer: Self-pay

## 2018-09-27 VITALS — BP 181/82 | HR 59 | Ht 63.0 in | Wt 156.0 lb

## 2018-09-27 DIAGNOSIS — M79642 Pain in left hand: Secondary | ICD-10-CM | POA: Diagnosis not present

## 2018-09-27 DIAGNOSIS — M19042 Primary osteoarthritis, left hand: Secondary | ICD-10-CM | POA: Diagnosis not present

## 2018-09-27 MED ORDER — DICLOFENAC SODIUM 75 MG PO TBEC: 75.0000 mg | DELAYED_RELEASE_TABLET | Freq: Two times a day (BID) | ORAL | 1 refills | Status: AC

## 2018-09-27 MED FILL — DICLOFENAC SODIUM 75 MG TAB: 75 | 30 days supply | Qty: 60 | Fill #0

## 2018-09-27 NOTE — Patient Instructions (Addendum)
You have a bad flare of arthritis in this hand. Icing 15 minutes at a time 3-4 times a day (heat is ok instead if this feels better). Diclofenac 75mg  twice a day with food for pain and inflammation - do NOT take this with ibuprofen, aleve, celebrex, or meloxicam - stop these others if you're taking them. Call me if this isn't helping enough and we can try the prednisone dose pack but I'd like to avoid this if possible. Let me know how you're doing in 1-2 weeks.

## 2018-09-27 NOTE — Progress Notes (Signed)
PCP: [name], MD  Subjective:   HPI: Patient is a 68 y.o. female here for left hand pain.  Pt presents with 8-9/10pain in the right 4th and 5th finger x2 weeks. No injury. Pain is diffuse throughout the entire finger. Worse with use of her hand/gripping. Unable to make full fist. Relief only with rest and keeping hand still. She reports swelling. Diffuse numbness/tingling at times in the whole hand. No erythema. No skin changes  Past Medical History:  Diagnosis Date  . Acute peptic ulcer of stomach    As a teenager  . Allergy    dust, cock roach,grass,weeds,molds  . Anxiety   . Arthritis    knees, hands, shoulders  . Asthma    02/08/10 FEV1 1.98 (80%), s/p saba 2.22 l/m (90%).nml  . Broken ankle    right foot, wearing a boot  . Cognitive change 06/10/2016  . Dementia ([place])   . Depression   . Diabetes mellitus 2003   Type II  . Diverticulosis   . Dyspepsia   . Fatty liver 12/19/2010  . GERD (gastroesophageal reflux disease)   . Hearing loss    bilateral hearing aids   . Herpes simplex virus (HSV) infection   . High cholesterol   . Hyperlipemia   . Hypertension   . Hypothyroidism 09/08/2013  . IBS (irritable bowel syndrome)   . Insomnia   . Internal hemorrhoids   . Joint pain 08/08/2016  . Low back pain 08/10/2016  . OSA (obstructive sleep apnea)    Does not use CPAP - old one broken, new sleep study to be scheduled and then given a new CPAP machine  . Plantar fasciitis    no longer an issue  . Sun-damaged skin 12/09/2016  . SVD (spontaneous vaginal delivery)    x 4  . Vaginal Pap smear, abnormal   . Vitamin D deficiency 03/04/2016  . Wears partial dentures    upper dentures and lower partial    Current Outpatient Medications on File Prior to Visit  Medication Sig Dispense Refill  . ACCU-CHEK SOFTCLIX LANCETS lancets Use as directed twice daily to check blood sugar. DX E11.9 200 each 3  . albuterol (VENTOLIN HFA) 108 (90 Base) MCG/ACT inhaler INHALE 2 PUFFS BY  MOUTH INTO THE LUNGS EVERY 4 (FOUR) HOURS AS NEEDED FOR SHORTNESS OF BREATH AND WHEEZING 18 g 1  . Alcohol Swabs (B-D SINGLE USE SWABS REGULAR) PADS Use as directed to check blood sugar. DX E11.9 100 each 3  . Apoaequorin (PREVAGEN EXTRA STRENGTH PO) Take 1 capsule by mouth daily.    Marland Kitchen atenolol (TENORMIN) 25 MG tablet TAKE 1 TABLET EVERY DAY 90 tablet 3  . atorvastatin (LIPITOR) 80 MG tablet TAKE 1 TABLET EVERY DAY 90 tablet 0  . Blood Glucose Calibration (ACCU-CHEK AVIVA) SOLN Use as directed to check blood sugar.  DX E11.9 1 each 3  . Blood Glucose Monitoring Suppl (ACCU-CHEK AVIVA PLUS) w/Device KIT USE AS DIRECTED TO CHECK BLOOD SUGAR. 1 kit 0  . cetirizine (ZYRTEC) 10 MG tablet Take 1 tablet (10 mg total) by mouth daily as needed for allergies. 30 tablet 11  . Cholecalciferol (VITAMIN D3) 2000 units TABS Take 1 tablet by mouth daily.    Marland Kitchen dicyclomine (BENTYL) 10 MG capsule TAKE 1 CAPSULE BY MOUTH EVERY 4 TO 6 HOURS AS NEEDED FOR ABDOMINAL PAIN AND CRAMPING OR DIARRHEA 60 capsule 1  . diphenoxylate-atropine (LOMOTIL) 2.5-0.025 MG tablet Take 1 tablet by mouth every morning. If continued  diarrhea in the next 2-3 days may increase to twice daily. 30 tablet 3  . donepezil (ARICEPT) 10 MG tablet Take 1 tablet (10 mg total) by mouth at bedtime. 30 tablet 0  . escitalopram (LEXAPRO) 20 MG tablet Take 1 tablet (20 mg total) by mouth daily. 30 tablet 0  . fenofibrate 160 MG tablet TAKE 1 TABLET (160 MG TOTAL) BY MOUTH DAILY. 90 tablet 2  . fluticasone (FLONASE) 50 MCG/ACT nasal spray Place 2 sprays into both nostrils daily. 16 g 3  . gabapentin (NEURONTIN) 100 MG capsule Take 1 capsule (100 mg total) by mouth 3 (three) times daily. 90 capsule 3  . glimepiride (AMARYL) 4 MG tablet TAKE 1 TABLET TWICE DAILY 180 tablet 1  . glucose blood (ACCU-CHEK AVIVA PLUS) test strip Use as directed twice daily to check blood sugar.  DX E11.9 200 each 3  . hydrochlorothiazide (HYDRODIURIL) 25 MG tablet Take 1 tablet  (25 mg total) by mouth daily. 30 tablet 3  . ketorolac (ACULAR) 0.5 % ophthalmic solution Place 1 drop into the right eye 4 (four) times daily. (Patient taking differently: Place 1 drop into both eyes 4 (four) times daily. ) 10 mL 0  . levothyroxine (SYNTHROID, LEVOTHROID) 25 MCG tablet TAKE 1 TABLET DAILY BEFORE BREAKFAST. 90 tablet 0  . losartan (COZAAR) 50 MG tablet TAKE 1 TABLET EVERY DAY 90 tablet 0  . montelukast (SINGULAIR) 10 MG tablet TAKE 1 TABLET (10 MG TOTAL) BY MOUTH AT BEDTIME AS NEEDED (ALLERGIES). 30 tablet 3  . Multiple Vitamins-Minerals (CENTRUM SILVER PO) Take 1 tablet by mouth daily.      . pantoprazole (PROTONIX) 40 MG tablet TAKE 1 TABLET EVERY DAY 90 tablet 0  . prednisoLONE acetate (PRED FORTE) 1 % ophthalmic suspension Place 1 drop into the right eye 4 (four) times daily. 10 mL 0  . Probiotic Product (ALIGN) 4 MG CAPS Take 1 capsule by mouth daily. Reported on 07/02/2015    . sitaGLIPtin (JANUVIA) 100 MG tablet Take 1 tablet (100 mg total) by mouth daily. 90 tablet 1  . triamcinolone cream (KENALOG) 0.1 % Apply 1 application topically 2 (two) times daily.   0  . valACYclovir (VALTREX) 1000 MG tablet TAKE 1 TABLET (1,000 MG TOTAL) BY MOUTH 2 TIMES DAILY. 60 tablet 5   No current facility-administered medications on file prior to visit.     Past Surgical History:  Procedure Laterality Date  . APPENDECTOMY     age 81  . CATARACT EXTRACTION Bilateral 2016   Dr. Bing Plume  . CATARACT EXTRACTION, BILATERAL     12/8 and 12/14  . COLONOSCOPY  06/03/2010, 08/25/2011    severeal - polyps and diverticulosis   . DILATATION & CURETTAGE/HYSTEROSCOPY WITH MYOSURE N/A 04/01/2018   Procedure: DILATATION & CURETTAGE/HYSTEROSCOPY WITH MYOSURE POLYPECTOMY;  Surgeon: Emily Filbert, MD;  Location: [facility];  Service: Gynecology;  Laterality: N/A;  . EYE SURGERY     cateracts removed bilateral  . GASTRECTOMY     partial - ulcer as a teen  . HAND SURGERY Right    thumb   . HEMORRHOID  BANDING    . KNEE ARTHROSCOPY Left 1998  . NASAL SEPTUM SURGERY    . RHINOPLASTY    . TOE SURGERY Right 1995    Allergies  Allergen Reactions  . Compazine [Prochlorperazine Edisylate]     Comma for 3 days  . Sulfa Antibiotics Itching    severe itching  . Tetanus Toxoid Other (See Comments)  convulsions    Social History   Socioeconomic History  . Marital status: Widowed    Spouse name: Not on file  . Number of children: 4  . Years of education: Not on file  . Highest education level: Not on file  Occupational History  . Occupation: retired    Fish farm manager: [employer]  Social Needs  . Financial resource strain: Not on file  . Food insecurity:    Worry: Not on file    Inability: Not on file  . Transportation needs:    Medical: Not on file    Non-medical: Not on file  Tobacco Use  . Smoking status: Never Smoker  . Smokeless tobacco: Never Used  Substance and Sexual Activity  . Alcohol use: Yes    Alcohol/week: 0.0 standard drinks    Comment: Rare occasions   . Drug use: No  . Sexual activity: Not Currently    Birth control/protection: Post-menopausal  Lifestyle  . Physical activity:    Days per week: Not on file    Minutes per session: Not on file  . Stress: Not on file  Relationships  . Social connections:    Talks on phone: Not on file    Gets together: Not on file    Attends religious service: Not on file    Active member of club or organization: Not on file    Attends meetings of clubs or organizations: Not on file    Relationship status: Not on file  . Intimate partner violence:    Fear of current or ex partner: Not on file    Emotionally abused: Not on file    Physically abused: Not on file    Forced sexual activity: Not on file  Other Topics Concern  . Not on file  Social History Narrative   No caffeine drinks daily     Family History  Problem Relation Age of Onset  . Diabetes Mother        type II  . Heart attack Mother 51  . Stroke Mother   .  Hyperlipidemia Mother   . Colon cancer Sister   . Cancer Sister        GI  . Heart disease Sister   . Asthma Maternal Grandmother   . Mental illness Son        PTSD from [employer]  . Parkinsonism Father   . Cancer Sister        stage 4 bladder cancer  . Colon polyps Sister   . Ulcers Other        bleeding gastric  . Heart defect Other        child  . Other Other        perforated bowel, child  . Cancer Brother        pancreatic cancer  . Amblyopia Neg Hx   . Blindness Neg Hx   . Cataracts Neg Hx   . Glaucoma Neg Hx   . Macular degeneration Neg Hx   . Retinal detachment Neg Hx   . Strabismus Neg Hx   . Retinitis pigmentosa Neg Hx     BP (!) 181/82   Pulse (!) 59   Ht '5\' 3"'  (1.6 m)   Wt 156 lb (70.8 kg)   LMP  (LMP Unknown)   BMI 27.63 kg/m   Review of Systems: See HPI above.     Objective:  Physical Exam:  Gen: awake, alert, NAD, comfortable in exam room Pulm: breathing unlabored  Left Wrist/Hand: Inspection:  hypertrophic changes of DIPs and PIPs throughout hand Palpation: TTP about the entirety of the 4th and 5th digit, worse at the joints ROM: limited flexion of the DIP and PIP of the 4th and 5th digits due to pain and stiffness Strength: 5/5 strength in the wrist. Extensor and flexor tendons intact with isolation Neurovascular: NV intact  Right wrist/hand: Inspection: hypertrophic changes of DIPs and PIPs throughout hand Palpation: no TTP ROM: full ROM, able to make full fist Strength: 5/5 strength Neurovascular: NV intact   Assessment & Plan:  1. Left hand pain, isolated to PIP and DIP of 4th and 5th digit secondary to arthritis - ice - diclofenac 49m BID - steroid dosepack if diclofenac not helpful - f/u 1-2 weeks

## 2018-09-28 ENCOUNTER — Encounter: Payer: Self-pay | Admitting: Family Medicine

## 2018-10-06 ENCOUNTER — Other Ambulatory Visit: Payer: Self-pay | Admitting: Cardiology

## 2018-10-06 ENCOUNTER — Telehealth: Payer: Self-pay

## 2018-10-06 NOTE — Telephone Encounter (Signed)
Received celebrex refill request from pharmacy. Medication shows as discontinued by another provider. Medication ordered for 2 weeks by Dr. Bettina Gavia in 06/2018. Phoned patient so determine if she's still taking medication or not. Patient states she hasn't been taking for a long time and denies chostrocondral discomfort.

## 2018-10-26 NOTE — Progress Notes (Signed)
Patient ID: Crystal [name], female   DOB: 12/16/1950, 67 y.o.   MRN: [id number]   HPI: Crystal [name] is a 67 y.o.-year-old female, presenting for follow-up for DM2, dx in 2003, non-insulin-dependent, uncontrolled, with complications (PN, DR).  Last visit 4 months ago.  She had SOB - severe allergies.  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  They are living in the same apartment.  Last hemoglobin A1c was: Lab Results  Component Value Date   HGBA1C 6.5 (A) 06/22/2018   HGBA1C 7.4 (H) 02/23/2018   HGBA1C 6.8 (A) 12/17/2017   Pt is on a regimen of:  - Januvia 100 mg before breakfast >> ran out an did not refill it In 06/2018, we stopped Amaryl due to lows at lunchtime. She had to stop Jardiance in the past because of increased IBS symptoms and also yeast infections. Metformin gave her diarrhea in the past. Januvia was too expensive in the past Invokana >> cannot remember why stopped  Pt stopped checking her sugars  - no meter:  - am: 57, 105-145, 157 >> 85-115 >> 85-116 >> 105-120 - 2h after b'fast: 199 >> n/c - before lunch: n/c - 2h after lunch: 127-135 >> 120-140s >> n/c >> 57 - before dinner: n/c - 2h after dinner: n/c - bedtime:120-140s >> 135-150 >> lower than 135 - nighttime: n/c Lowest sugar was 57 >> ?; she has hypoglycemia awareness in the 80s. Highest sugar was 140 >> ?.  Glucometer: AccuChek [employer]iva  Pt's meals are: - Breakfast: bowl of cereals or bacon + egg + toast or egg sandwich or biscuit + gravy + hash browns; Still drinks OJ or milk. - Lunch: leftovers from dinner or tuna + salad dressing + tuna; box of mac and cheese + tuna; raman noodles; cheese + fruit - Dinner: may eat out (chinese)   -No CKD, last BUN/creatinine:  Lab Results  Component Value Date   BUN 13 06/02/2018   BUN 17 05/17/2018   CREATININE 0.81 06/02/2018   CREATININE 0.76 05/17/2018  On losartan 50. -+ HL; last set of  lipids: Lab Results  Component Value Date   CHOL 142 02/23/2018   HDL 51.70 02/23/2018   LDLCALC 66 02/23/2018   LDLDIRECT 123.0 09/18/2014   TRIG 124.0 02/23/2018   CHOLHDL 3 02/23/2018  On Lipitor 80, fenofibrate 160 - last eye exam was in 09/2017: + DR. Sees retina specialist: Dr. Zamora. -+ Numbness and tingling.  Sees podiatry - Dr. Wagoner.  Recently started Neurontin by PCP  Pt also has mild cognitive impairment.  Of note, reviewing her chart, her TSH was slightly elevated in 02/2018: Lab Results  Component Value Date   TSH 4.79 (H) 02/23/2018  On LT4 low dose.  ROS: Constitutional: no weight gain/no weight loss, no fatigue, no subjective hyperthermia, no subjective hypothermia Eyes: no blurry vision, no xerophthalmia ENT: no sore throat, no nodules palpated in neck, no dysphagia, no odynophagia, no hoarseness Cardiovascular: no CP/+ SOB/no palpitations/no leg swelling Respiratory: + cough/+ SOB/no wheezing Gastrointestinal: no N/no V/no D/no C/no acid reflux Musculoskeletal: no muscle aches/no joint aches Skin: no rashes, no hair loss Neurological: no tremors/+ numbness/+ tingling/no dizziness  I reviewed pt's medications, allergies, PMH, social hx, family hx, and changes were documented in the history of present illness. Otherwise, unchanged from my initial visit note.  Past Medical History:  Diagnosis Date  . Acute peptic ulcer of stomach      As a teenager  . Allergy    dust, cock roach,grass,weeds,molds  . Anxiety   . Arthritis    knees, hands, shoulders  . Asthma    02/08/10 FEV1 1.98 (80%), s/p saba 2.22 l/m (90%).nml  . Broken ankle    right foot, wearing a boot  . Cognitive change 06/10/2016  . Dementia (HCC)   . Depression   . Diabetes mellitus 2003   Type II  . Diverticulosis   . Dyspepsia   . Fatty liver 12/19/2010  . GERD (gastroesophageal reflux disease)   . Hearing loss    bilateral hearing aids   . Herpes simplex virus (HSV) infection   .  High cholesterol   . Hyperlipemia   . Hypertension   . Hypothyroidism 09/08/2013  . IBS (irritable bowel syndrome)   . Insomnia   . Internal hemorrhoids   . Joint pain 08/08/2016  . Low back pain 08/10/2016  . OSA (obstructive sleep apnea)    Does not use CPAP - old one broken, new sleep study to be scheduled and then given a new CPAP machine  . Plantar fasciitis    no longer an issue  . Sun-damaged skin 12/09/2016  . SVD (spontaneous vaginal delivery)    x 4  . Vaginal Pap smear, abnormal   . Vitamin D deficiency 03/04/2016  . Wears partial dentures    upper dentures and lower partial   Past Surgical History:  Procedure Laterality Date  . APPENDECTOMY     age 17  . CATARACT EXTRACTION Bilateral 2016   Dr. Digby  . CATARACT EXTRACTION, BILATERAL     12/8 and 12/14  . COLONOSCOPY  06/03/2010, 08/25/2011    severeal - polyps and diverticulosis   . DILATATION & CURETTAGE/HYSTEROSCOPY WITH MYOSURE N/A 04/01/2018   Procedure: DILATATION & CURETTAGE/HYSTEROSCOPY WITH MYOSURE POLYPECTOMY;  Surgeon: Dove, Myra C, MD;  Location: [facility];  Service: Gynecology;  Laterality: N/A;  . EYE SURGERY     cateracts removed bilateral  . GASTRECTOMY     partial - ulcer as a teen  . HAND SURGERY Right    thumb   . HEMORRHOID BANDING    . KNEE ARTHROSCOPY Left 1998  . NASAL SEPTUM SURGERY    . RHINOPLASTY    . TOE SURGERY Right 1995   Social History   Social History  . Marital status: widowed    Spouse name: Lenny  . Number of children: 4  . Years of education: N/A   Occupational History  . retired [employer]   Social History Main Topics  . Smoking status: Never Smoker  . Smokeless tobacco: Never Used  . Alcohol use No  . Drug use: No  . Sexual activity: No     Comment: lives with her mother, no dietary restrictions   Other Topics Concern  . Not on file   Social History Narrative   No caffeine drinks daily    Current Outpatient Medications on File Prior to Visit  Medication Sig  Dispense Refill  . ACCU-CHEK SOFTCLIX LANCETS lancets Use as directed twice daily to check blood sugar. DX E11.9 200 each 3  . albuterol (VENTOLIN HFA) 108 (90 Base) MCG/ACT inhaler INHALE 2 PUFFS BY MOUTH INTO THE LUNGS EVERY 4 (FOUR) HOURS AS NEEDED FOR SHORTNESS OF BREATH AND WHEEZING 18 g 1  . Alcohol Swabs (B-D SINGLE USE SWABS REGULAR) PADS Use as directed to check blood sugar. DX E11.9 100 each 3  . Apoaequorin (PREVAGEN EXTRA STRENGTH PO) Take   1 capsule by mouth daily.    Marland Kitchen atenolol (TENORMIN) 25 MG tablet TAKE 1 TABLET EVERY DAY 90 tablet 3  . atorvastatin (LIPITOR) 80 MG tablet TAKE 1 TABLET EVERY DAY 90 tablet 0  . Blood Glucose Calibration (ACCU-CHEK AVIVA) SOLN Use as directed to check blood sugar.  DX E11.9 1 each 3  . Blood Glucose Monitoring Suppl (ACCU-CHEK AVIVA PLUS) w/Device KIT USE AS DIRECTED TO CHECK BLOOD SUGAR. 1 kit 0  . cetirizine (ZYRTEC) 10 MG tablet Take 1 tablet (10 mg total) by mouth daily as needed for allergies. 30 tablet 11  . Cholecalciferol (VITAMIN D3) 2000 units TABS Take 1 tablet by mouth daily.    . diclofenac (VOLTAREN) 75 MG EC tablet Take 1 tablet (75 mg total) by mouth 2 (two) times daily. 60 tablet 1  . dicyclomine (BENTYL) 10 MG capsule TAKE 1 CAPSULE BY MOUTH EVERY 4 TO 6 HOURS AS NEEDED FOR ABDOMINAL PAIN AND CRAMPING OR DIARRHEA 60 capsule 1  . diphenoxylate-atropine (LOMOTIL) 2.5-0.025 MG tablet Take 1 tablet by mouth every morning. If continued diarrhea in the next 2-3 days may increase to twice daily. 30 tablet 3  . donepezil (ARICEPT) 10 MG tablet Take 1 tablet (10 mg total) by mouth at bedtime. 30 tablet 0  . escitalopram (LEXAPRO) 20 MG tablet Take 1 tablet (20 mg total) by mouth daily. 30 tablet 0  . fenofibrate 160 MG tablet TAKE 1 TABLET (160 MG TOTAL) BY MOUTH DAILY. 90 tablet 2  . fluticasone (FLONASE) 50 MCG/ACT nasal spray Place 2 sprays into both nostrils daily. 16 g 3  . gabapentin (NEURONTIN) 100 MG capsule Take 1 capsule (100 mg  total) by mouth 3 (three) times daily. 90 capsule 3  . glimepiride (AMARYL) 4 MG tablet TAKE 1 TABLET TWICE DAILY 180 tablet 1  . glucose blood (ACCU-CHEK AVIVA PLUS) test strip Use as directed twice daily to check blood sugar.  DX E11.9 200 each 3  . hydrochlorothiazide (HYDRODIURIL) 25 MG tablet Take 1 tablet (25 mg total) by mouth daily. 30 tablet 3  . ketorolac (ACULAR) 0.5 % ophthalmic solution Place 1 drop into the right eye 4 (four) times daily. (Patient taking differently: Place 1 drop into both eyes 4 (four) times daily. ) 10 mL 0  . levothyroxine (SYNTHROID, LEVOTHROID) 25 MCG tablet TAKE 1 TABLET DAILY BEFORE BREAKFAST. 90 tablet 0  . losartan (COZAAR) 50 MG tablet TAKE 1 TABLET EVERY DAY 90 tablet 0  . montelukast (SINGULAIR) 10 MG tablet TAKE 1 TABLET (10 MG TOTAL) BY MOUTH AT BEDTIME AS NEEDED (ALLERGIES). 30 tablet 3  . Multiple Vitamins-Minerals (CENTRUM SILVER PO) Take 1 tablet by mouth daily.      . pantoprazole (PROTONIX) 40 MG tablet TAKE 1 TABLET EVERY DAY 90 tablet 0  . prednisoLONE acetate (PRED FORTE) 1 % ophthalmic suspension Place 1 drop into the right eye 4 (four) times daily. 10 mL 0  . Probiotic Product (ALIGN) 4 MG CAPS Take 1 capsule by mouth daily. Reported on 07/02/2015    . sitaGLIPtin (JANUVIA) 100 MG tablet Take 1 tablet (100 mg total) by mouth daily. 90 tablet 1  . triamcinolone cream (KENALOG) 0.1 % Apply 1 application topically 2 (two) times daily.   0  . valACYclovir (VALTREX) 1000 MG tablet TAKE 1 TABLET (1,000 MG TOTAL) BY MOUTH 2 TIMES DAILY. 60 tablet 5   No current facility-administered medications on file prior to visit.    Allergies  Allergen Reactions  .  Compazine [Prochlorperazine Edisylate]     Comma for 3 days  . Sulfa Antibiotics Itching    severe itching  . Tetanus Toxoid Other (See Comments)    convulsions   Family History  Problem Relation Age of Onset  . Diabetes Mother        type II  . Heart attack Mother 50  . Stroke Mother    . Hyperlipidemia Mother   . Colon cancer Sister   . Cancer Sister        GI  . Heart disease Sister   . Asthma Maternal Grandmother   . Mental illness Son        PTSD from military  . Parkinsonism Father   . Cancer Sister        stage 4 bladder cancer  . Colon polyps Sister   . Ulcers Other        bleeding gastric  . Heart defect Other        child  . Other Other        perforated bowel, child  . Cancer Brother        pancreatic cancer  . Amblyopia Neg Hx   . Blindness Neg Hx   . Cataracts Neg Hx   . Glaucoma Neg Hx   . Macular degeneration Neg Hx   . Retinal detachment Neg Hx   . Strabismus Neg Hx   . Retinitis pigmentosa Neg Hx    Pt has FH of DM in mother.  PE: BP (!) 160/90   Pulse 78   Temp 98 F (36.7 C)   Ht 5' 3" (1.6 m)   Wt 160 lb (72.6 kg)   LMP  (LMP Unknown)   SpO2 98%   BMI 28.34 kg/m  Wt Readings from Last 3 Encounters:  10/27/18 160 lb (72.6 kg)  09/27/18 156 lb (70.8 kg)  09/02/18 159 lb (72.1 kg)   Constitutional: overweight, in NAD Eyes: PERRLA, EOMI, no exophthalmos ENT: moist mucous membranes, no thyromegaly, no cervical lymphadenopathy Cardiovascular: RRR, No MRG Respiratory: CTA B, no crackles, no rhonchi, no wheezing Gastrointestinal: abdomen soft, NT, ND, BS+ Musculoskeletal: no deformities, strength intact in all 4 Skin: moist, warm, no rashes Neurological: no tremor with outstretched hands, DTR normal in all 4  ASSESSMENT: 1. DM2, non-insulin-dependent, uncontrolled, with complications - PN - DR  She saw nutrition in the past: "I know what I need to do, but I just cannot do it"  We are limited in our options for treatment by the cost of the medicines.  At the previous visit, we called her pharmacy with patient in the room and almost all the medicines that we could use were more than $100 per month month for her...  2. HL  3.  Numbness in fingers  4. SOB  PLAN:  1. Patient with longstanding, uncontrolled, type 2  diabetes, on oral antidiabetic regimen with DPP 4 inhibitor only.  At last visit, we stopped her glimepiride 4 mg daily due to low CBGs before lunch.  I advised her that if her sugars increased she may need to add back 2 mg only. -Latest HbA1c was reviewed and this was at goal, at 6.5%. -At this visit, she is not checking sugars as she does not have a meter (we gave her a One Touch verio glucometer today) and she also stopped Januvia as she did not refill it when she ran out. HbA1c today 9.7% (much higher) -Now that we gave her meter, she will start   checking her sugars and will also restart Januvia and also low-dose Amaryl twice a day until her sugars decrease and then hopefully we may be able to stop her sulfonylurea.  She will let me know if the sugars have not improved in 2 weeks. - I advised her to: Patient Instructions  Please restart: - Januvia 100 mg before b'fast - Amaryl 2 mg 2x a day before meals  Please return in 4 months with your sugar log.   - continue checking sugars at different times of the day - check 1x a day, rotating checks - advised for yearly eye exams >> she is not UTD - Return to clinic in 4 mo with sugar log    2. HL - Reviewed latest lipid panel from 02/2018: LDL much improved from 2016, the other fractions at goal, also Lab Results  Component Value Date   CHOL 142 02/23/2018   HDL 51.70 02/23/2018   LDLCALC 66 02/23/2018   LDLDIRECT 123.0 09/18/2014   TRIG 124.0 02/23/2018   CHOLHDL 3 02/23/2018  - Continues Lipitor 80 and fenofibrate 160 without side effects.  3.  Peripheral neuropathy-numbness in fingers -Her vitamin B12 level is normal. -I suggested alpha lipoic acid and B complex, but she did not start this -Her symptoms improved after cutting back on coffee -PCP also started her on Neurontin 08/2018 -She had a slightly elevated TSH last summer.  This will need to be repeated at next lab draw  4. SOB - Likely related to allergies -Chest sounds  clear to auscultation -Denies significant cough or chest pain  Philemon Kingdom, MD PhD [facility]

## 2018-10-27 ENCOUNTER — Telehealth: Payer: Self-pay | Admitting: Medical

## 2018-10-27 ENCOUNTER — Encounter: Payer: Self-pay | Admitting: Internal Medicine

## 2018-10-27 ENCOUNTER — Encounter: Payer: Self-pay | Admitting: Medical

## 2018-10-27 ENCOUNTER — Other Ambulatory Visit: Payer: Self-pay

## 2018-10-27 ENCOUNTER — Ambulatory Visit (INDEPENDENT_AMBULATORY_CARE_PROVIDER_SITE_OTHER): Payer: Medicare HMO | Admitting: Medical

## 2018-10-27 ENCOUNTER — Ambulatory Visit (INDEPENDENT_AMBULATORY_CARE_PROVIDER_SITE_OTHER): Payer: Medicare HMO | Admitting: Internal Medicine

## 2018-10-27 VITALS — BP 160/90 | HR 78 | Temp 98.0°F | Ht 63.0 in | Wt 160.0 lb

## 2018-10-27 VITALS — BP 160/90 | HR 78 | Temp 98.0°F

## 2018-10-27 DIAGNOSIS — E785 Hyperlipidemia, unspecified: Secondary | ICD-10-CM

## 2018-10-27 DIAGNOSIS — J301 Allergic rhinitis due to pollen: Secondary | ICD-10-CM

## 2018-10-27 DIAGNOSIS — E11319 Type 2 diabetes mellitus with unspecified diabetic retinopathy without macular edema: Secondary | ICD-10-CM | POA: Diagnosis not present

## 2018-10-27 DIAGNOSIS — J0100 Acute maxillary sinusitis, unspecified: Secondary | ICD-10-CM | POA: Diagnosis not present

## 2018-10-27 DIAGNOSIS — R062 Wheezing: Secondary | ICD-10-CM

## 2018-10-27 DIAGNOSIS — G63 Polyneuropathy in diseases classified elsewhere: Secondary | ICD-10-CM

## 2018-10-27 DIAGNOSIS — E1149 Type 2 diabetes mellitus with other diabetic neurological complication: Secondary | ICD-10-CM | POA: Diagnosis not present

## 2018-10-27 DIAGNOSIS — R0602 Shortness of breath: Secondary | ICD-10-CM

## 2018-10-27 DIAGNOSIS — J40 Bronchitis, not specified as acute or chronic: Secondary | ICD-10-CM | POA: Diagnosis not present

## 2018-10-27 LAB — POCT GLYCOSYLATED HEMOGLOBIN (HGB A1C): Hemoglobin A1C: 9.7 % — AB (ref 4.0–5.6)

## 2018-10-27 MED ORDER — GLUCOSE BLOOD VI STRP: ORAL_STRIP | 12 refills | Status: AC

## 2018-10-27 MED ORDER — FLUTICASONE PROPIONATE HFA 110 MCG/ACT IN AERO: 2.0000 | INHALATION_SPRAY | Freq: Two times a day (BID) | RESPIRATORY_TRACT | 3 refills | Status: DC

## 2018-10-27 MED ORDER — FLUTICASONE PROPIONATE 50 MCG/ACT NA SUSP: 2.0000 | Freq: Every day | NASAL | 3 refills | Status: DC

## 2018-10-27 MED ORDER — GLIMEPIRIDE 2 MG PO TABS: 4.0000 mg | ORAL_TABLET | Freq: Two times a day (BID) | ORAL | 3 refills | Status: DC

## 2018-10-27 MED ORDER — SITAGLIPTIN PHOSPHATE 100 MG PO TABS: 100.0000 mg | ORAL_TABLET | Freq: Every day | ORAL | 3 refills | Status: AC

## 2018-10-27 MED ORDER — LANCETS ULTRA FINE MISC: 12 refills | Status: AC

## 2018-10-27 MED ORDER — MONTELUKAST SODIUM 10 MG PO TABS: ORAL_TABLET | ORAL | 3 refills | Status: DC

## 2018-10-27 MED ORDER — AZITHROMYCIN 250 MG PO TABS: ORAL_TABLET | ORAL | 0 refills | Status: DC

## 2018-10-27 MED ORDER — ALBUTEROL SULFATE HFA 108 (90 BASE) MCG/ACT IN AERS: 2.0000 | INHALATION_SPRAY | Freq: Four times a day (QID) | RESPIRATORY_TRACT | 2 refills | Status: DC | PRN

## 2018-10-27 MED ORDER — LEVOCETIRIZINE DIHYDROCHLORIDE 5 MG PO TABS: 5.0000 mg | ORAL_TABLET | Freq: Every evening | ORAL | 3 refills | Status: DC

## 2018-10-27 MED FILL — ACCU-CHEK SOFTCLIX LANCETS: 90 days supply | Qty: 200 | Fill #0 | Status: TO

## 2018-10-27 MED FILL — FLUTICASONE PROP 50 MCG SPR: 50 | 30 days supply | Qty: 16 | Fill #0 | Status: TO

## 2018-10-27 MED FILL — ACCU-CHEK AVIVA PLUS TEST S: 75 days supply | Qty: 150 | Fill #0 | Status: TO

## 2018-10-27 MED FILL — GLIMEPIRIDE 2 MG TABLET: 2 | 23 days supply | Qty: 90 | Fill #0 | Status: TO

## 2018-10-27 MED FILL — ALBUTEROL SULFATE HFA 108 (: 108 (90 BAS | 25 days supply | Qty: 9 | Fill #0

## 2018-10-27 MED FILL — LEVOCETIRIZINE 5 MG TABLET: 5 | 30 days supply | Qty: 30 | Fill #0

## 2018-10-27 MED FILL — MONTELUKAST SOD 10 MG TAB: 10 | 30 days supply | Qty: 30 | Fill #0

## 2018-10-27 MED FILL — FLOVENT HFA 110 MCG INHALER: 110 | 30 days supply | Qty: 12 | Fill #0 | Status: TO

## 2018-10-27 MED FILL — AZITHROMYCIN 250 MG TABS: 250 | 5 days supply | Qty: 6 | Fill #0

## 2018-10-27 MED FILL — JANUVIA 100 MG TABLET: 100 | 30 days supply | Qty: 30 | Fill #0

## 2018-10-27 MED FILL — ACCU-CHEK AVIVA PLUS W/DEVI: W/DEVICE | 30 days supply | Qty: 1 | Fill #0

## 2018-10-27 NOTE — Progress Notes (Signed)
Subjective:    Patient ID: Crystal [name], female    DOB: Jan 19, 1951, 68 y.o.   MRN: [id number]  HPI  Virtual Visit via Video Note  I connected with Crystal [name] on 10/27/18 at  3:50 PM EDT by a video enabled telemedicine application and verified that I am speaking with the correct person using two identifiers.   I discussed the limitations of evaluation and management by telemedicine and the availability of in person appointments. The patient expressed understanding and agreed to proceed.  It is important to note that we used doxy virtual video platform. We established connection. I could see her and she could see me but audio issues so I called her.    History of Present Illness: Pt was at Dr. Renne Crigler office earlier today. Her sugar was 200 and a1c was 9.0  Pt states she has nasal congested with some sinus pressure for past 3 days. Pain in frontal sinus and maxillary sinus areas. She states last night some chest congestion and wheezing. Pt has not had any fever or body aches.  Pt has allergies. She is on montelukast but only has 3 pills. Pt is not on antihistamine.  Pt states her sister in law passed away 2 months ago. Pt has been going through some boxes of her sister in laws and was exposed to a lot dust.  No hx of smoking but she has a lot of second hand smoking in the past.  Pt feels like she can fully inflate her lungs since yesterday. No cardiac like pain. No leg pain.    Observations/Objective: No acute distress. When first got on video. With video discruption later called and spoke normal manner. Did not speak in labored manner.  Assessment and Plan: Patient does appear to have recent allergic rhinitis secondary to pollen as well as recent dust exposure working with dusty boxes.  Now appears to have probable sinus infection and bronchitis with some wheezing.  A lot of secondhand smoke exposure when she was younger.  Although she never smoked herself  directly.  Refilled her montelukast and added Xyzal antihistamine.  Prescribe Flonase nasal spray as well.  In addition prescribed Flovent to help prevent wheezing and also prescribe albuterol to use if needed.  She does already report frontal and maxillary sinus tenderness and in light of recent pandemic I decided to go ahead and prescribe her azithromycin.  Describes herself as being clinically stable but did ask her to notify us of any fevers over 100 or if she has any worsening/changing signs or symptoms.  Not concerned presently that she has any COVID type signs or symptoms.  Presently suspect only allergies with some secondary complications.  Follow-up 7 days by virtual visit or as needed.  Follow Up Instructions:    I discussed the assessment and treatment plan with the patient. The patient was provided an opportunity to ask questions and all were answered. The patient agreed with the plan and demonstrated an understanding of the instructions.   The patient was advised to call back or seek an in-person evaluation if the symptoms worsen or if the condition fails to improve as anticipated.  I provided  25  minutes of non-face-to-face time during this encounter.   Mackie Pai, PA-C   Review of Systems  Constitutional: Negative for chills, fatigue and fever.  HENT: Positive for congestion, postnasal drip, sinus pressure, sinus pain and sneezing.   Respiratory: Positive for cough and wheezing.   Cardiovascular: Negative for chest  pain and palpitations.  Gastrointestinal: Negative for abdominal pain.  Musculoskeletal: Negative for back pain and myalgias.  Skin: Negative for rash.  Neurological: Negative for dizziness, weakness and headaches.  Hematological: Negative for adenopathy. Does not bruise/bleed easily.  Psychiatric/Behavioral: Negative for behavioral problems.       Objective:   Physical Exam  No acute distress. See objective      Assessment & Plan:

## 2018-10-27 NOTE — Patient Instructions (Signed)
Patient does appear to have recent allergic rhinitis secondary to pollen as well as recent dust exposure working with dusty boxes.  Now appears to have probable sinus infection and bronchitis with some wheezing.  A lot of secondhand smoke exposure when she was younger.  Although she never smoked herself directly.  Refilled her montelukast and added Xyzal antihistamine.  Prescribe Flonase nasal spray as well.  In addition prescribed Flovent to help prevent wheezing and also prescribe albuterol to use if needed.  She does already report frontal and maxillary sinus tenderness and in light of recent pandemic I decided to go ahead and prescribe her azithromycin.  Describes herself as being clinically stable but did ask her to notify us of any fevers over 100 or if she has any worsening/changing signs or symptoms.  Not concerned presently that she has any COVID type signs or symptoms.  Presently suspect only allergies with some secondary complications.  Follow-up 7 days by virtual visit or as needed.

## 2018-10-27 NOTE — Telephone Encounter (Signed)
Copied from [place] [id number]. Topic: Quick Communication - See Telephone Encounter >> Oct 27, 2018  5:45 PM Valla Leaver wrote: CRM for notification. See Telephone encounter for: 10/27/18. Patient would like a call back to know why she was sent 3 inhalers after her appointment today?

## 2018-10-27 NOTE — Patient Instructions (Addendum)
Please restart: - Januvia 100 mg before b'fast - Amaryl 2 mg 2x a day before meals  Please return in 4 months with your sugar log.

## 2018-10-28 NOTE — Telephone Encounter (Signed)
Spoke with pt and advised her per PA's instruction of why inhalers and nasal spray was prescribed and pt voices understanding.

## 2018-11-01 ENCOUNTER — Ambulatory Visit: Payer: Medicare HMO | Admitting: Podiatry

## 2018-11-08 ENCOUNTER — Telehealth: Payer: Self-pay | Admitting: Internal Medicine

## 2018-11-08 NOTE — Telephone Encounter (Signed)
As requested by Dr Renne Crigler patient calling to advise since her last visit her blood sugars have not come down.  Call back number is [phone]

## 2018-11-10 NOTE — Telephone Encounter (Signed)
Could you please call this pt to follow up

## 2018-11-10 NOTE — Telephone Encounter (Signed)
Can you please ask her to send me a list of what she eats for her meals (in general) - I suspect, this is related to diet. Also, if she still drinks OJ or milk >> she needs to stop those. She can send the info by MyChart.

## 2018-11-10 NOTE — Telephone Encounter (Signed)
Pt is going to send a Mychart message with everything she has been eating.  Pt states that she has been drinking Milk and Orange Juice - aware to stop drinking these now.   Will send back to Dr Cruzita Lederer to make aware.

## 2018-11-10 NOTE — Telephone Encounter (Signed)
Please advise 

## 2018-11-13 MED FILL — DICLOFENAC SODIUM 75 MG TAB: 75 | 30 days supply | Qty: 60 | Fill #1 | Status: TO

## 2018-11-15 ENCOUNTER — Encounter: Payer: Self-pay | Admitting: Internal Medicine

## 2018-11-24 MED FILL — valACYclovir HCL 1 GM TABS: 1 | 30 days supply | Qty: 60 | Fill #0 | Status: TO

## 2018-11-26 ENCOUNTER — Encounter (HOSPITAL_BASED_OUTPATIENT_CLINIC_OR_DEPARTMENT_OTHER): Payer: Self-pay | Admitting: Emergency Medicine

## 2018-11-26 ENCOUNTER — Emergency Department (HOSPITAL_BASED_OUTPATIENT_CLINIC_OR_DEPARTMENT_OTHER): Payer: Medicare HMO

## 2018-11-26 ENCOUNTER — Ambulatory Visit: Payer: Self-pay | Admitting: *Deleted

## 2018-11-26 ENCOUNTER — Emergency Department (HOSPITAL_BASED_OUTPATIENT_CLINIC_OR_DEPARTMENT_OTHER)
Admission: EM | Admit: 2018-11-26 | Discharge: 2018-11-26 | Disposition: A | Discharge status: Home / Self Care | Payer: Medicare HMO | Attending: Emergency Medicine | Admitting: Emergency Medicine

## 2018-11-26 ENCOUNTER — Other Ambulatory Visit: Payer: Self-pay

## 2018-11-26 DIAGNOSIS — E079 Disorder of thyroid, unspecified: Secondary | ICD-10-CM | POA: Diagnosis not present

## 2018-11-26 DIAGNOSIS — E041 Nontoxic single thyroid nodule: Secondary | ICD-10-CM | POA: Diagnosis not present

## 2018-11-26 DIAGNOSIS — R0602 Shortness of breath: Secondary | ICD-10-CM | POA: Diagnosis not present

## 2018-11-26 DIAGNOSIS — E039 Hypothyroidism, unspecified: Secondary | ICD-10-CM | POA: Insufficient documentation

## 2018-11-26 DIAGNOSIS — J45909 Unspecified asthma, uncomplicated: Secondary | ICD-10-CM | POA: Insufficient documentation

## 2018-11-26 DIAGNOSIS — Z79899 Other long term (current) drug therapy: Secondary | ICD-10-CM | POA: Insufficient documentation

## 2018-11-26 DIAGNOSIS — F0390 Unspecified dementia without behavioral disturbance: Secondary | ICD-10-CM | POA: Insufficient documentation

## 2018-11-26 DIAGNOSIS — Z7984 Long term (current) use of oral hypoglycemic drugs: Secondary | ICD-10-CM | POA: Insufficient documentation

## 2018-11-26 DIAGNOSIS — I10 Essential (primary) hypertension: Secondary | ICD-10-CM | POA: Insufficient documentation

## 2018-11-26 DIAGNOSIS — E1142 Type 2 diabetes mellitus with diabetic polyneuropathy: Secondary | ICD-10-CM | POA: Insufficient documentation

## 2018-11-26 DIAGNOSIS — R079 Chest pain, unspecified: Secondary | ICD-10-CM | POA: Diagnosis not present

## 2018-11-26 LAB — TROPONIN I: Troponin I: <0.03 ng/mL (ref <0.03)

## 2018-11-26 LAB — COMPREHENSIVE METABOLIC PANEL
ALT: 34 U/L (ref 0–44)
AST: 25 U/L (ref 15–41)
Albumin: 4 g/dL (ref 3.5–5.0)
Alkaline Phosphatase: 43 U/L (ref 38–126)
Anion gap: 8 (ref 5–15)
BUN: 18 mg/dL (ref 8–23)
CO2: 26 mmol/L (ref 22–32)
Calcium: 9.3 mg/dL (ref 8.9–10.3)
Chloride: 107 mmol/L (ref 98–111)
Creatinine, Ser: 0.89 mg/dL (ref 0.44–1.00)
GFR calc Af Amer: >60 mL/min (ref >60)
GFR calc non Af Amer: >60 mL/min (ref >60)
Glucose, Bld: 150 mg/dL — ABNORMAL HIGH (ref 70–99)
Potassium: 3.9 mmol/L (ref 3.5–5.1)
Sodium: 141 mmol/L (ref 135–145)
Total Bilirubin: 0.5 mg/dL (ref 0.3–1.2)
Total Protein: 7.4 g/dL (ref 6.5–8.1)

## 2018-11-26 LAB — CBC WITH DIFFERENTIAL/PLATELET
Abs Immature Granulocytes: 0.03 10*3/uL (ref 0.00–0.07)
Basophils Absolute: 0 10*3/uL (ref 0.0–0.1)
Basophils Relative: 0 %
Eosinophils Absolute: 0.1 10*3/uL (ref 0.0–0.5)
Eosinophils Relative: 2 %
HCT: 39.4 % (ref 36.0–46.0)
Hemoglobin: 12.7 g/dL (ref 12.0–15.0)
Immature Granulocytes: 1 %
Lymphocytes Relative: 39 %
Lymphs Abs: 2.2 10*3/uL (ref 0.7–4.0)
MCH: 30.2 pg (ref 26.0–34.0)
MCHC: 32.2 g/dL (ref 30.0–36.0)
MCV: 93.6 fL (ref 80.0–100.0)
Monocytes Absolute: 0.5 10*3/uL (ref 0.1–1.0)
Monocytes Relative: 8 %
Neutro Abs: 2.8 10*3/uL (ref 1.7–7.7)
Neutrophils Relative %: 50 %
Platelets: 266 10*3/uL (ref 150–400)
RBC: 4.21 MIL/uL (ref 3.87–5.11)
RDW: 12.9 % (ref 11.5–15.5)
WBC: 5.7 10*3/uL (ref 4.0–10.5)
nRBC: 0 % (ref 0.0–0.2)

## 2018-11-26 LAB — BRAIN NATRIURETIC PEPTIDE: B Natriuretic Peptide: 90.4 pg/mL (ref 0.0–100.0)

## 2018-11-26 LAB — D-DIMER, QUANTITATIVE: D-Dimer, Quant: 0.92 ug/mL-FEU — ABNORMAL HIGH (ref 0.00–0.50)

## 2018-11-26 MED ORDER — IOHEXOL 350 MG/ML SOLN
100.0000 mL | Freq: Once | INTRAVENOUS | Status: AC | PRN
  Administered 2018-11-26: 21:00:00 100 mL via INTRAVENOUS

## 2018-11-26 NOTE — ED Provider Notes (Signed)
[facility] EMERGENCY DEPARTMENT Provider Note   CSN: [id number] Arrival date & time: 11/26/18  1659    History   Chief Complaint Chief Complaint  Patient presents with   Shortness of Breath    HPI Crystal [name] is a 68 y.o. female.     68 year old female with extensive past medical history below including asthma, type 2 diabetes mellitus, dementia, OSA, hypertension, hyperlipidemia, GERD who presents with shortness of breath.  Patient states that she has had several months of progressively worsening shortness of breath.  She especially notices it when she is moving around and doing things at work.  She notes that for the past year, she has had intermittent central chest pains but she thinks that these are related to gas.  The pains are not associated with shortness of breath and occur randomly.  They improve with Gas-X.  She denies any nausea, vomiting, cough, fevers, recent illness, or sick contacts.  No recent travel, history of blood clots, or history of cancer.  No estrogen use.  She denies any major changes in her weight.  No orthopnea or lower extremity edema.  She has been using her inhalers without much relief.  She does not feel like she is wheezing.  The history is provided by the patient.  Shortness of Breath    Past Medical History:  Diagnosis Date   Acute peptic ulcer of stomach    As a teenager   Allergy    dust, cock roach,grass,weeds,molds   Anxiety    Arthritis    knees, hands, shoulders   Asthma    02/08/10 FEV1 1.98 (80%), s/p saba 2.22 l/m (90%).nml   Broken ankle    right foot, wearing a boot   Cognitive change 06/10/2016   Dementia ([place])    Depression    Diabetes mellitus 2003   Type II   Diverticulosis    Dyspepsia    Fatty liver 12/19/2010   GERD (gastroesophageal reflux disease)    Hearing loss    bilateral hearing aids    Herpes simplex virus (HSV) infection    High cholesterol    Hyperlipemia     Hypertension    Hypothyroidism 09/08/2013   IBS (irritable bowel syndrome)    Insomnia    Internal hemorrhoids    Joint pain 08/08/2016   Low back pain 08/10/2016   OSA (obstructive sleep apnea)    Does not use CPAP - old one broken, new sleep study to be scheduled and then given a new CPAP machine   Plantar fasciitis    no longer an issue   Sun-damaged skin 12/09/2016   SVD (spontaneous vaginal delivery)    x 4   Vaginal Pap smear, abnormal    Vitamin D deficiency 03/04/2016   Wears partial dentures    upper dentures and lower partial    Patient Active Problem List   Diagnosis Date Noted   Costochondral chest pain 06/01/2018   Postmenopausal bleeding 03/20/2018   Burn 08/07/2017   Tremor 06/26/2017   Left knee pain 06/18/2017   Right knee pain 12/11/2016   Herpes simplex disease 12/09/2016   Sun-damaged skin 12/09/2016   Mixed stress and urge urinary incontinence 10/23/2016   Mild cognitive impairment 08/21/2016   Mild single current episode of major depressive disorder ([place]) 08/21/2016   Low back pain 08/10/2016   Joint pain 08/08/2016   Prolapsed internal hemorrhoids, grade 2 06/18/2016   Cognitive change 06/10/2016   Family history of colon cancer 05/19/2016  Vitamin D deficiency 03/04/2016   Deformity of metatarsal bone of right foot 08/15/2015   Metatarsalgia of right foot 08/15/2015   Otalgia 04/02/2015   Preventative health care 06/25/2014   Sinusitis 12/09/2013   Osteopenia 09/19/2013   Hypothyroidism 09/08/2013   Osteoarthritis, knee 09/06/2013   Skin lesion 09/01/2013   Dysphagia, unspecified(787.20) 07/19/2013   Allergic rhinitis 11/16/2012   Osteoarthritis of both hands 10/15/2012   Peripheral neuropathy 12/19/2011   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 Bing Plume   CATARACT EXTRACTION, BILATERAL     12/8 and 12/14   COLONOSCOPY  06/03/2010, 08/25/2011    severeal - polyps and diverticulosis    DILATATION & CURETTAGE/HYSTEROSCOPY WITH MYOSURE N/A 04/01/2018   Procedure: DILATATION & CURETTAGE/HYSTEROSCOPY WITH MYOSURE POLYPECTOMY;  Surgeon: Emily Filbert, MD;  Location: [facility];  Service: Gynecology;  Laterality: N/A;   EYE SURGERY     cateracts removed bilateral   GASTRECTOMY     partial - ulcer as a teen   HAND SURGERY Right    thumb    HEMORRHOID BANDING     KNEE ARTHROSCOPY Left 1998   NASAL SEPTUM SURGERY     RHINOPLASTY     TOE SURGERY Right 1995     OB History    Gravida  4   Para  4   Term      Preterm      AB      Living  3     SAB      TAB      Ectopic      Multiple      Live Births           Obstetric Comments  1 child died at 92 weeks (heart defects, perforated bowel)         Home Medications    Prior to Admission medications   Medication Sig Start Date End Date Taking? Authorizing Provider  albuterol (PROVENTIL HFA;VENTOLIN HFA) 108 (90 Base) MCG/ACT inhaler Inhale 2 puffs into the lungs every 6 (six) hours as needed for wheezing or shortness of breath. 10/27/18   Saguier, Percell Miller, PA-C  albuterol (VENTOLIN HFA) 108 (90 Base) MCG/ACT inhaler INHALE 2 PUFFS BY MOUTH INTO THE LUNGS EVERY 4 (FOUR) HOURS AS NEEDED FOR SHORTNESS OF BREATH AND WHEEZING 02/23/18   [name], MD  Alcohol Swabs (B-D SINGLE USE SWABS REGULAR) PADS Use as directed to check blood sugar. DX E11.9  07/15/17   [name], MD  atenolol (TENORMIN) 25 MG tablet TAKE 1 TABLET EVERY DAY 09/06/18   [name], MD  atorvastatin (LIPITOR) 80 MG tablet TAKE 1 TABLET EVERY DAY 06/08/18   [name], MD  cetirizine (ZYRTEC) 10 MG tablet Take 1 tablet (10 mg total) by mouth daily as needed for allergies. 05/07/17   [name], MD  Cholecalciferol (VITAMIN D3) 2000 units TABS Take 1 tablet by mouth daily.    [provider]  diclofenac (VOLTAREN) 75 MG EC tablet Take 1 tablet (75 mg total) by mouth 2 (two) times daily. 09/27/18   Hudnall, Sharyn Lull, MD  dicyclomine (BENTYL) 10 MG capsule TAKE 1 CAPSULE BY MOUTH EVERY 4 TO 6 HOURS AS NEEDED FOR ABDOMINAL PAIN AND CRAMPING OR DIARRHEA 06/04/18   Levin Erp, PA  diphenoxylate-atropine (LOMOTIL) 2.5-0.025 MG tablet Take 1 tablet by mouth every morning. If continued diarrhea in the next 2-3 days may increase to twice daily. 06/22/18   Willia Craze, NP  donepezil (ARICEPT) 10 MG tablet Take 1 tablet (10 mg total) by mouth at bedtime. 02/24/18 02/24/19  Aundra Dubin, MD  escitalopram (LEXAPRO) 20 MG tablet Take 1 tablet (20 mg total) by mouth daily. 02/24/18   Aundra Dubin, MD  fenofibrate 160 MG tablet TAKE 1 TABLET (160 MG TOTAL) BY MOUTH DAILY. 09/06/18   [name], MD  fluticasone (FLOVENT HFA) 110 MCG/ACT inhaler Inhale 2 puffs into the lungs 2 (two) times daily. 10/27/18   Saguier, Percell Miller, PA-C  gabapentin (NEURONTIN) 100 MG capsule Take 1 capsule (100 mg total) by mouth 3 (three) times daily. 09/02/18   Debbrah Alar, NP  glimepiride (AMARYL) 2 MG tablet Take 2 tablets (4 mg total) by mouth 2 (two) times daily. 10/27/18   Philemon Kingdom, MD  glucose blood (ONETOUCH VERIO) test strip Use as instructed to check blood sugar 2 times a day 10/27/18   Philemon Kingdom, MD  hydrochlorothiazide (HYDRODIURIL) 25 MG tablet Take 1 tablet (25 mg total) by mouth daily. 09/02/18   Debbrah Alar, NP    ketorolac (ACULAR) 0.5 % ophthalmic solution Place 1 drop into the right eye 4 (four) times daily. Patient taking differently: Place 1 drop into both eyes 4 (four) times daily.  05/24/18 05/24/19  Bernarda Caffey, MD  Lancets Ultra Fine MISC Use to check blood sugar two times a day 10/27/18   Philemon Kingdom, MD  levocetirizine (XYZAL) 5 MG tablet Take 1 tablet (5 mg total) by mouth every evening. 10/27/18   Saguier, Percell Miller, PA-C  levothyroxine (SYNTHROID, LEVOTHROID) 25 MCG tablet TAKE 1 TABLET DAILY BEFORE BREAKFAST. 09/06/18   [name], MD  losartan (COZAAR) 50 MG tablet TAKE 1 TABLET EVERY DAY 09/06/18   [name], MD  montelukast (SINGULAIR) 10 MG tablet TAKE 1 TABLET (10 MG TOTAL) BY MOUTH AT BEDTIME AS NEEDED (ALLERGIES). 10/27/18   Saguier, Percell Miller, PA-C  Multiple Vitamins-Minerals (CENTRUM SILVER PO) Take 1 tablet by mouth daily.      [provider]  pantoprazole (PROTONIX) 40 MG tablet TAKE 1 TABLET EVERY DAY 09/06/18   [name], MD  prednisoLONE acetate (PRED FORTE) 1 % ophthalmic suspension Place 1 drop into the right eye 4 (four) times daily. 05/24/18   Bernarda Caffey, MD  Probiotic Product (ALIGN) 4 MG CAPS Take 1 capsule by mouth daily. Reported on 07/02/2015    [provider]  sitaGLIPtin (JANUVIA) 100 MG tablet Take 1 tablet (100 mg total) by mouth daily. 10/27/18   Philemon Kingdom, MD  triamcinolone cream (KENALOG) 0.1 % Apply 1 application topically 2 (two) times daily.  03/18/18   [provider]  valACYclovir (VALTREX) 1000 MG tablet TAKE 1 TABLET (1,000 MG TOTAL) BY MOUTH 2 TIMES DAILY. 08/06/18   [name], MD    Family History Family History  Problem Relation Age of Onset   Diabetes Mother        type II   Heart attack Mother  36   Stroke Mother    Hyperlipidemia Mother    Colon cancer Sister    Cancer Sister        GI   Heart disease Sister    Asthma Maternal Grandmother    Mental illness Son        PTSD  from [place]   Parkinsonism Father    Cancer Sister        stage 4 bladder cancer   Colon polyps Sister    Ulcers Other        bleeding gastric   Heart defect Other        child   Other Other        perforated bowel, child   Cancer Brother        pancreatic cancer   Amblyopia Neg Hx    Blindness Neg Hx    Cataracts Neg Hx    Glaucoma Neg Hx    Macular degeneration Neg Hx    Retinal detachment Neg Hx    Strabismus Neg Hx    Retinitis pigmentosa Neg Hx     Social History Social History   Tobacco Use   Smoking status: Never Smoker   Smokeless tobacco: Never Used  Substance Use Topics   Alcohol use: Yes    Alcohol/week: 0.0 standard drinks    Comment: Rare occasions    Drug use: No     Allergies   Compazine [prochlorperazine edisylate]; Sulfa antibiotics; and Tetanus toxoid   Review of Systems Review of Systems  Respiratory: Positive for shortness of breath.    All other systems reviewed and are negative except that which was mentioned in HPI   Physical Exam Updated Vital Signs BP (!) 175/96 (BP Location: Left Arm)    Pulse 70    Temp 98 F (36.7 C) (Oral)    Resp 20    Wt 70.3 kg    LMP  (LMP Unknown)    SpO2 97%    BMI 27.46 kg/m   Physical Exam Vitals signs and nursing note reviewed.  Constitutional:      General: She is not in acute distress.    Appearance: She is well-developed.  HENT:     Head: Normocephalic and atraumatic.  Eyes:     Conjunctiva/sclera: Conjunctivae normal.  Neck:     Musculoskeletal: Neck supple.  Cardiovascular:     Rate and Rhythm: Normal rate and regular rhythm.     Heart sounds: Normal heart sounds. No murmur.  Pulmonary:     Effort: Pulmonary effort is normal.     Breath sounds: Normal breath sounds. No wheezing.  Abdominal:     General: Bowel sounds are normal. There is no distension.     Palpations: Abdomen is soft.     Tenderness: There is no abdominal tenderness.  Musculoskeletal:     Right  lower leg: No edema.     Left lower leg: No edema.  Skin:    General: Skin is warm and dry.  Neurological:     Mental Status: She is alert and oriented to person, place, and time.     Comments: Fluent speech  Psychiatric:        Mood and Affect: Mood normal.        Judgment: Judgment normal.      ED Treatments / Results  Labs (all labs ordered are listed, but only abnormal results are displayed) Labs Reviewed  COMPREHENSIVE METABOLIC PANEL - Abnormal; Notable for the following components:  Result Value   Glucose, Bld 150 (*)    All other components within normal limits  D-DIMER, QUANTITATIVE (NOT AT [facility]) - Abnormal; Notable for the following components:   D-Dimer, Quant 0.92 (*)    All other components within normal limits  TROPONIN I  BRAIN NATRIURETIC PEPTIDE  CBC WITH DIFFERENTIAL/PLATELET    EKG None  Radiology Dg Chest 2 View  Result Date: 11/26/2018 CLINICAL DATA:  Shortness of breath EXAM: CHEST - 2 VIEW COMPARISON:  05/17/2018 FINDINGS: The heart size and mediastinal contours are within normal limits. Both lungs are clear. The visualized skeletal structures are unremarkable. IMPRESSION: No acute abnormality of the lungs. Electronically Signed   By: Eddie Candle M.D.   On: 11/26/2018 17:48   Ct Angio Chest Pe W/cm &/or Wo Cm  Result Date: 11/26/2018 CLINICAL DATA:  Shortness of breath and chest pain EXAM: CT ANGIOGRAPHY CHEST WITH CONTRAST TECHNIQUE: Multidetector CT imaging of the chest was performed using the standard protocol during bolus administration of intravenous contrast. Multiplanar CT image reconstructions and MIPs were obtained to evaluate the vascular anatomy. CONTRAST:  153mL OMNIPAQUE IOHEXOL 350 MG/ML SOLN COMPARISON:  Chest CT angiogram June 02, 2018 FINDINGS: Cardiovascular: There is no demonstrable pulmonary embolus. There is no thoracic aortic aneurysm or dissection. The visualized great vessels appear unremarkable. There is a degree of  aortic atherosclerosis. There is no pericardial effusion or pericardial thickening evident. Mediastinum/Nodes: There is a nodular lesion in the left lobe of the thyroid measuring 1.5 x 1.2 cm. There are several subcentimeter mediastinal lymph nodes. No adenopathy is evident in the thoracic region by size criteria. No esophageal lesions are evident. Lungs/Pleura: There is no parenchymal lung edema or consolidation. There is a stable 2 mm nodular opacity in the medial segment right middle lobe seen on axial slice 55 series 7. A 3 mm nodular opacity abutting the pleura in the anterior segment right upper lobe near the apex on axial slice 17 series 7 is stable. No new parenchymal lung lesions are evident. There is no pleural effusion or pleural thickening. Upper Abdomen: Postoperative changes noted in the visualized upper abdomen. There is an apparent left adrenal adenoma measuring 1.3 x 1.3 cm, stable. Visualized upper abdominal structures otherwise appear unremarkable. Musculoskeletal: There are no blastic or lytic bone lesions. There is degenerative change in the thoracic spine. No chest wall lesions are evident. Review of the MIP images confirms the above findings. IMPRESSION: 1. No demonstrable pulmonary embolus. No thoracic aortic aneurysm or dissection. There is aortic atherosclerosis. 2. **An incidental finding of potential clinical significance has been found. 1.5 x 1.2 cm nodular lesion in the left lobe of the thyroid. Consider further evaluation with thyroid ultrasound nonemergently. If patient is clinically hyperthyroid, consider nuclear medicine thyroid uptake and scan.** 3.  No demonstrable thoracic adenopathy by size criteria. 4. No lung edema or consolidation. Stable small nodular opacities in the right lung. No pleural effusion. 5.  Stable postoperative change upper abdomen. 6.  Stable benign left adrenal adenoma. Aortic Atherosclerosis (ICD10-I70.0). Electronically Signed   By: Lowella Grip III  M.D.   On: 11/26/2018 21:42    Procedures Procedures (including critical care time)  Medications Ordered in ED Medications  iohexol (OMNIPAQUE) 350 MG/ML injection 100 mL (100 mLs Intravenous Contrast Given 11/26/18 2110)     Initial Impression / Assessment and Plan / ED Course  I have reviewed the triage vital signs and the nursing notes.  Pertinent labs & imaging results that were available  during my care of the patient were reviewed by me and considered in my medical decision making (see chart for details).       Comfortable on exam, normal work of breathing and reassuring vital signs.  Hypertensive but normal O2 saturation on room air.  No infectious symptoms.  X-ray clear.  Lab work shows negative troponin, normal BNP, reassuring CBC and CMP.  D-dimer is elevated therefore obtain CTA of chest to rule out PE. CTA negative for PE, infiltrate, or edema. Incidental finding of thyroid nodule.  I discussed this finding with the patient and need for outpatient follow-up for ultrasound and further testing.  Regarding her shortness of breath, I recommended that she contact her PCP for clinic appointment and consideration of further testing such as pulmonary function tests or ECHO.  Her chest pain has been very atypical and seems likely to be GI in nature as it always improves with Gas-X, is not exertional, and is not related to her shortness of breath complaint.  I have extensively reviewed return precautions with her including any severe worsening of her breathing, new chest pain, or other new symptoms such as fever.  She has voiced understanding.  Final Clinical Impressions(s) / ED Diagnoses   Final diagnoses:  SOB (shortness of breath)  Thyroid nodule    ED Discharge Orders    None       Mouhamad Teed, Wenda Overland, MD 11/26/18 2242

## 2018-11-26 NOTE — ED Triage Notes (Addendum)
Short of breath for a couple weeks that is getting worse.  Sts she uses all of her inhalers and it feels like she has not used them.  Denies any cough. Denies fever.  Sts even talking makes her SOB. Sts she has had pain in her upper left chest, left shoulder, and left upper back for a year and that is relieved by Gas-X.

## 2018-11-26 NOTE — Telephone Encounter (Signed)
Patient is having a lot of trouble breathing and she is also having trouble keeping her glucose in control. Patient has been struggling for 2-3 months- things have gotten worse for the past few weeks- patient has started walking- she walking 1-2 miles. Patient has noticed her inhalers work- but within an hour she she gasping and running out of breathe.  Patient is also having problem controling her glucose- patient has been struggling for some time. Patient has noticed she is averaging 135. Patient ranges 59-240. Patient is checking twice daily- fasting am  (usually 200 or slightly lower)- and 2 hours after dinner. (Usually 135)  Patient is having trouble with her breathing- patient wants to get this checked. Inhalers are not helping her anymore. Patient is going to go to ED.    Reason for Disposition . MODERATE difficulty breathing (e.g., speaks in phrases, SOB even at rest, pulse 100-120)  Answer Assessment - Initial Assessment Questions 1. COVID-19 DIAGNOSIS: "Who made your Coronavirus (COVID-19) diagnosis?" "Was it confirmed by a positive lab test?" If not diagnosed by a HCP, ask "Are there lots of cases (community spread) where you live?" (See public health department website, if unsure)   * MAJOR community spread: high number of cases; numbers of cases are increasing; many people hospitalized.   * MINOR community spread: low number of cases; not increasing; few or no people hospitalized     minor 2. ONSET: "When did the COVID-19 symptoms start?"      2-3 month- getting worse over time 3. WORST SYMPTOM: "What is your worst symptom?" (e.g., cough, fever, shortness of breath, muscle aches)     SOB- breathness 4. COUGH: "Do you have a cough?" If so, ask: "How bad is the cough?"       No cough 5. FEVER: "Do you have a fever?" If so, ask: "What is your temperature, how was it measured, and when did it start?"     No fever 6. RESPIRATORY STATUS: "Describe your breathing?" (e.g., shortness of  breath, wheezing, unable to speak)      SOB, wheezing 7. BETTER-SAME-WORSE: "Are you getting better, staying the same or getting worse compared to yesterday?"  If getting worse, ask, "In what way?"     Getting harder to breath 8. HIGH RISK DISEASE: "Do you have any chronic medical problems?" (e.g., asthma, heart or lung disease, weak immune system, etc.)     Age, high blood pressure, diabetes 9. PREGNANCY: "Is there any chance you are pregnant?" "When was your last menstrual period?"     n/a 10. OTHER SYMPTOMS: "Do you have any other symptoms?"  (e.g., runny nose, headache, sore throat, loss of smell)       Chronic runny nose  Protocols used: CORONAVIRUS (COVID-19) DIAGNOSED OR SUSPECTED-A-AH

## 2018-11-29 MED FILL — MONTELUKAST SOD 10 MG TAB: 10 | 30 days supply | Qty: 30 | Fill #1 | Status: TO

## 2018-11-29 MED FILL — LEVOCETIRIZINE 5 MG TABLET: 5 | 30 days supply | Qty: 30 | Fill #1 | Status: TO

## 2018-11-29 MED FILL — JANUVIA 100 MG TABLET: 100 | 30 days supply | Qty: 30 | Fill #1 | Status: TO

## 2018-11-29 MED FILL — DIPHENOXYLATE-ATROPINE 2.5-: 2.5-0.025 | 30 days supply | Qty: 30 | Fill #2 | Status: TO

## 2018-11-29 NOTE — Telephone Encounter (Signed)
FYI

## 2018-11-30 ENCOUNTER — Other Ambulatory Visit: Payer: Self-pay

## 2018-11-30 ENCOUNTER — Ambulatory Visit: Payer: Medicare HMO | Admitting: Podiatry

## 2018-11-30 VITALS — Temp 97.2°F

## 2018-11-30 DIAGNOSIS — B351 Tinea unguium: Secondary | ICD-10-CM | POA: Diagnosis not present

## 2018-11-30 DIAGNOSIS — M79675 Pain in left toe(s): Secondary | ICD-10-CM | POA: Diagnosis not present

## 2018-11-30 DIAGNOSIS — M79674 Pain in right toe(s): Secondary | ICD-10-CM | POA: Diagnosis not present

## 2018-11-30 DIAGNOSIS — S91115A Laceration without foreign body of left lesser toe(s) without damage to nail, initial encounter: Secondary | ICD-10-CM | POA: Diagnosis not present

## 2018-11-30 NOTE — Patient Instructions (Signed)
Diabetes Mellitus and Foot Care Foot care is an important part of your health, especially when you have diabetes. Diabetes may cause you to have problems because of poor blood flow (circulation) to your feet and legs, which can cause your skin to:  Become thinner and drier.  Break more easily.  Heal more slowly.  Peel and crack. You may also have nerve damage (neuropathy) in your legs and feet, causing decreased feeling in them. This means that you may not notice minor injuries to your feet that could lead to more serious problems. Noticing and addressing any potential problems early is the best way to prevent future foot problems. How to care for your feet Foot hygiene  Wash your feet daily with warm water and mild soap. Do not use hot water. Then, pat your feet and the areas between your toes until they are completely dry. Do not soak your feet as this can dry your skin.  Trim your toenails straight across. Do not dig under them or around the cuticle. File the edges of your nails with an emery board or nail file.  Apply a moisturizing lotion or petroleum jelly to the skin on your feet and to dry, brittle toenails. Use lotion that does not contain alcohol and is unscented. Do not apply lotion between your toes. Shoes and socks  Wear clean socks or stockings every day. Make sure they are not too tight. Do not wear knee-high stockings since they may decrease blood flow to your legs.  Wear shoes that fit properly and have enough cushioning. Always look in your shoes before you put them on to be sure there are no objects inside.  To break in new shoes, wear them for just a few hours a day. This prevents injuries on your feet. Wounds, scrapes, corns, and calluses  Check your feet daily for blisters, cuts, bruises, sores, and redness. If you cannot see the bottom of your feet, use a mirror or ask someone for help.  Do not cut corns or calluses or try to remove them with medicine.  If you  find a minor scrape, cut, or break in the skin on your feet, keep it and the skin around it clean and dry. You may clean these areas with mild soap and water. Do not clean the area with peroxide, alcohol, or iodine.  If you have a wound, scrape, corn, or callus on your foot, look at it several times a day to make sure it is healing and not infected. Check for: ? Redness, swelling, or pain. ? Fluid or blood. ? Warmth. ? Pus or a bad smell. General instructions  Do not cross your legs. This may decrease blood flow to your feet.  Do not use heating pads or hot water bottles on your feet. They may burn your skin. If you have lost feeling in your feet or legs, you may not know this is happening until it is too late.  Protect your feet from hot and cold by wearing shoes, such as at the beach or on hot pavement.  Schedule a complete foot exam at least once a year (annually) or more often if you have foot problems. If you have foot problems, report any cuts, sores, or bruises to your health care provider immediately. Contact a health care provider if:  You have a medical condition that increases your risk of infection and you have any cuts, sores, or bruises on your feet.  You have an injury that is not   healing.  You have redness on your legs or feet.  You feel burning or tingling in your legs or feet.  You have pain or cramps in your legs and feet.  Your legs or feet are numb.  Your feet always feel cold.  You have pain around a toenail. Get help right away if:  You have a wound, scrape, corn, or callus on your foot and: ? You have pain, swelling, or redness that gets worse. ? You have fluid or blood coming from the wound, scrape, corn, or callus. ? Your wound, scrape, corn, or callus feels warm to the touch. ? You have pus or a bad smell coming from the wound, scrape, corn, or callus. ? You have a fever. ? You have a red line going up your leg. Summary  Check your feet every day  for cuts, sores, red spots, swelling, and blisters.  Moisturize feet and legs daily.  Wear shoes that fit properly and have enough cushioning.  If you have foot problems, report any cuts, sores, or bruises to your health care provider immediately.  Schedule a complete foot exam at least once a year (annually) or more often if you have foot problems. This information is not intended to replace advice given to you by your health care provider. Make sure you discuss any questions you have with your health care provider. Document Released: 06/27/2000 Document Revised: 08/12/2017 Document Reviewed: 08/01/2016 [employer] Interactive Patient Education  2019 [employer]  Corns and Calluses Corns are small areas of thickened skin that occur on the top, sides, or tip of a toe. They contain a cone-shaped core with a point that can press on a nerve below. This causes pain.  Calluses are areas of thickened skin that can occur anywhere on the body, including the hands, fingers, palms, soles of the feet, and heels. Calluses are usually larger than corns. What are the causes? Corns and calluses are caused by rubbing (friction) or pressure, such as from shoes that are too tight or do not fit properly. What increases the risk? Corns are more likely to develop in people who have misshapen toes (toe deformities), such as hammer toes. Calluses can occur with friction to any area of the skin. They are more likely to develop in people who:  Work with their hands.  Wear shoes that fit poorly, are too tight, or are high-heeled.  Have toe deformities. What are the signs or symptoms? Symptoms of a corn or callus include:  A hard growth on the skin.  Pain or tenderness under the skin.  Redness and swelling.  Increased discomfort while wearing tight-fitting shoes, if your feet are affected. If a corn or callus becomes infected, symptoms may include:  Redness and swelling that gets worse.  Pain.   Fluid, blood, or pus draining from the corn or callus. How is this diagnosed? Corns and calluses may be diagnosed based on your symptoms, your medical history, and a physical exam. How is this treated? Treatment for corns and calluses may include:  Removing the cause of the friction or pressure. This may involve: ? Changing your shoes. ? Wearing shoe inserts (orthotics) or other protective layers in your shoes, such as a corn pad. ? Wearing gloves.  Applying medicine to the skin (topical medicine) to help soften skin in the hardened, thickened areas.  Removing layers of dead skin with a file to reduce the size of the corn or callus.  Removing the corn or callus with a scalpel   or laser.  Taking antibiotic medicines, if your corn or callus is infected.  Having surgery, if a toe deformity is the cause. Follow these instructions at home:   Take over-the-counter and prescription medicines only as told by your health care provider.  If you were prescribed an antibiotic, take it as told by your health care provider. Do not stop taking it even if your condition starts to improve.  Wear shoes that fit well. Avoid wearing high-heeled shoes and shoes that are too tight or too loose.  Wear any padding, protective layers, gloves, or orthotics as told by your health care provider.  Soak your hands or feet and then use a file or pumice stone to soften your corn or callus. Do this as told by your health care provider.  Check your corn or callus every day for symptoms of infection. Contact a health care provider if you:  Notice that your symptoms do not improve with treatment.  Have redness or swelling that gets worse.  Notice that your corn or callus becomes painful.  Have fluid, blood, or pus coming from your corn or callus.  Have new symptoms. Summary  Corns are small areas of thickened skin that occur on the top, sides, or tip of a toe.  Calluses are areas of thickened skin that  can occur anywhere on the body, including the hands, fingers, palms, and soles of the feet. Calluses are usually larger than corns.  Corns and calluses are caused by rubbing (friction) or pressure, such as from shoes that are too tight or do not fit properly.  Treatment may include wearing any padding, protective layers, gloves, or orthotics as told by your health care provider. This information is not intended to replace advice given to you by your health care provider. Make sure you discuss any questions you have with your health care provider. Document Released: 04/05/2004 Document Revised: 05/13/2017 Document Reviewed: 05/13/2017 Elsevier Interactive Patient Education  2019 [employer]  Onychomycosis/Fungal Toenails  WHAT IS IT? An infection that lies within the keratin of your nail plate that is caused by a fungus.  WHY ME? Fungal infections affect all ages, sexes, races, and creeds.  There may be many factors that predispose you to a fungal infection such as age, coexisting medical conditions such as diabetes, or an autoimmune disease; stress, medications, fatigue, genetics, etc.  Bottom line: fungus thrives in a warm, moist environment and your shoes offer such a location.  IS IT CONTAGIOUS? Theoretically, yes.  You do not want to share shoes, nail clippers or files with someone who has fungal toenails.  Walking around barefoot in the same room or sleeping in the same bed is unlikely to transfer the organism.  It is important to realize, however, that fungus can spread easily from one nail to the next on the same foot.  HOW DO WE TREAT THIS?  There are several ways to treat this condition.  Treatment may depend on many factors such as age, medications, pregnancy, liver and kidney conditions, etc.  It is best to ask your doctor which options are available to you.  1. No treatment.   Unlike many other medical concerns, you can live with this condition.  However for many people this can be  a painful condition and may lead to ingrown toenails or a bacterial infection.  It is recommended that you keep the nails cut short to help reduce the amount of fungal nail. 2. Topical treatment.  These range from herbal remedies   to prescription strength nail lacquers.  About 40-50% effective, topicals require twice daily application for approximately 9 to 12 months or until an entirely new nail has grown out.  The most effective topicals are medical grade medications available through physicians offices. 3. Oral antifungal medications.  With an 80-90% cure rate, the most common oral medication requires 3 to 4 months of therapy and stays in your system for a year as the new nail grows out.  Oral antifungal medications do require blood work to make sure it is a safe drug for you.  A liver function panel will be performed prior to starting the medication and after the first month of treatment.  It is important to have the blood work performed to avoid any harmful side effects.  In general, this medication safe but blood work is required. 4. Laser Therapy.  This treatment is performed by applying a specialized laser to the affected nail plate.  This therapy is noninvasive, fast, and non-painful.  It is not covered by insurance and is therefore, out of pocket.  The results have been very good with a 80-95% cure rate.  [facility] is the only practice in the area to offer this therapy. 5. Permanent Nail Avulsion.  Removing the entire nail so that a new nail will not grow back. 

## 2018-12-02 ENCOUNTER — Other Ambulatory Visit: Payer: Self-pay

## 2018-12-02 ENCOUNTER — Ambulatory Visit (INDEPENDENT_AMBULATORY_CARE_PROVIDER_SITE_OTHER): Payer: Medicare HMO | Admitting: Family Medicine

## 2018-12-02 ENCOUNTER — Telehealth: Payer: Self-pay | Admitting: Family Medicine

## 2018-12-02 VITALS — Wt 159.0 lb

## 2018-12-02 DIAGNOSIS — E1149 Type 2 diabetes mellitus with other diabetic neurological complication: Secondary | ICD-10-CM | POA: Diagnosis not present

## 2018-12-02 DIAGNOSIS — E041 Nontoxic single thyroid nodule: Secondary | ICD-10-CM

## 2018-12-02 DIAGNOSIS — E559 Vitamin D deficiency, unspecified: Secondary | ICD-10-CM

## 2018-12-02 DIAGNOSIS — K859 Acute pancreatitis without necrosis or infection, unspecified: Secondary | ICD-10-CM | POA: Diagnosis not present

## 2018-12-02 DIAGNOSIS — K219 Gastro-esophageal reflux disease without esophagitis: Secondary | ICD-10-CM | POA: Diagnosis not present

## 2018-12-02 DIAGNOSIS — I10 Essential (primary) hypertension: Secondary | ICD-10-CM | POA: Diagnosis not present

## 2018-12-02 DIAGNOSIS — J4520 Mild intermittent asthma, uncomplicated: Secondary | ICD-10-CM | POA: Diagnosis not present

## 2018-12-02 DIAGNOSIS — E039 Hypothyroidism, unspecified: Secondary | ICD-10-CM | POA: Diagnosis not present

## 2018-12-02 MED ORDER — FLUTICASONE PROPIONATE HFA 110 MCG/ACT IN AERO: 1.0000 | INHALATION_SPRAY | Freq: Two times a day (BID) | RESPIRATORY_TRACT | 3 refills | Status: AC

## 2018-12-02 MED ORDER — ALBUTEROL SULFATE HFA 108 (90 BASE) MCG/ACT IN AERS: INHALATION_SPRAY | RESPIRATORY_TRACT | 1 refills | Status: DC

## 2018-12-02 MED FILL — ALBUTEROL SULFATE HFA 108 (: 108 (90 BAS | 17 days supply | Qty: 18 | Fill #0 | Status: TO

## 2018-12-02 NOTE — Telephone Encounter (Signed)
LVM for call back to schedule 3 month f/u appt from visit on 12/02/18.

## 2018-12-06 DIAGNOSIS — E041 Nontoxic single thyroid nodule: Secondary | ICD-10-CM | POA: Insufficient documentation

## 2018-12-06 NOTE — Assessment & Plan Note (Signed)
Follows with endocrinology, minimize simple carbs. Increase exercise as tolerated. Continue current meds

## 2018-12-06 NOTE — Assessment & Plan Note (Signed)
Worsening symptoms. Refill given on inhalers and new referral placed for follow up with pulmonology

## 2018-12-06 NOTE — Assessment & Plan Note (Signed)
Ultrasound ordered to further evaluate

## 2018-12-06 NOTE — Assessment & Plan Note (Signed)
On Levothyroxine, continue to monitor 

## 2018-12-06 NOTE — Assessment & Plan Note (Signed)
no changes to meds. Encouraged heart healthy diet such as the DASH diet and exercise as tolerated.  

## 2018-12-06 NOTE — Progress Notes (Signed)
Virtual Visit via Video Note  I connected with Crystal [name] on 12/02/2018 at 10:20 AM EDT by a video enabled telemedicine application and verified that I am speaking with the correct person using two identifiers.  Location: Patient: home Provider: office   I discussed the limitations of evaluation and management by telemedicine and the availability of in person appointments. The patient expressed understanding and agreed to proceed. Magdalene Molly, CMA was able to get patient set up on virtual video visit.     Subjective:    Patient ID: Crystal [name], female    DOB: [birth year], 68 y.o.   MRN: [id number]  No chief complaint on file.   HPI Patient is in today for follow up on chronic medical concerns including hypothyroidism, diabetes, hypertension and more. She was in ER earlier in the month for SOB and is feeling some better since she has been using her inhalers. She had some chest discomfort as well and that is better now also. She notes Elberta Leatherwood is helpful. Denies palp/HA/congestion/fevers/GI or GU c/o. Taking meds as prescribed  Past Medical History:  Diagnosis Date  . Acute peptic ulcer of stomach    As a teenager  . Allergy    dust, cock roach,grass,weeds,molds  . Anxiety   . Arthritis    knees, hands, shoulders  . Asthma    02/08/10 FEV1 1.98 (80%), s/p saba 2.22 l/m (90%).nml  . Broken ankle    right foot, wearing a boot  . Cognitive change 06/10/2016  . Dementia ([place])   . Depression   . Diabetes mellitus 2003   Type II  . Diverticulosis   . Dyspepsia   . Fatty liver 12/19/2010  . GERD (gastroesophageal reflux disease)   . Hearing loss    bilateral hearing aids   . Herpes simplex virus (HSV) infection   . High cholesterol   . Hyperlipemia   . Hypertension   . Hypothyroidism 09/08/2013  . IBS (irritable bowel syndrome)   . Insomnia   . Internal hemorrhoids   . Joint pain 08/08/2016  . Low back pain 08/10/2016  . OSA (obstructive sleep apnea)    Does  not use CPAP - old one broken, new sleep study to be scheduled and then given a new CPAP machine  . Plantar fasciitis    no longer an issue  . Sun-damaged skin 12/09/2016  . SVD (spontaneous vaginal delivery)    x 4  . Vaginal Pap smear, abnormal   . Vitamin D deficiency 03/04/2016  . Wears partial dentures    upper dentures and lower partial    Past Surgical History:  Procedure Laterality Date  . APPENDECTOMY     age 74  . CATARACT EXTRACTION Bilateral 2016   Dr. Bing Plume  . CATARACT EXTRACTION, BILATERAL     12/8 and 12/14  . COLONOSCOPY  06/03/2010, 08/25/2011    severeal - polyps and diverticulosis   . DILATATION & CURETTAGE/HYSTEROSCOPY WITH MYOSURE N/A 04/01/2018   Procedure: DILATATION & CURETTAGE/HYSTEROSCOPY WITH MYOSURE POLYPECTOMY;  Surgeon: Emily Filbert, MD;  Location: [facility];  Service: Gynecology;  Laterality: N/A;  . EYE SURGERY     cateracts removed bilateral  . GASTRECTOMY     partial - ulcer as a teen  . HAND SURGERY Right    thumb   . HEMORRHOID BANDING    . KNEE ARTHROSCOPY Left 1998  . NASAL SEPTUM SURGERY    . RHINOPLASTY    . TOE SURGERY Right 1995  Family History  Problem Relation Age of Onset  . Diabetes Mother        type II  . Heart attack Mother 64  . Stroke Mother   . Hyperlipidemia Mother   . Colon cancer Sister   . Cancer Sister        GI  . Heart disease Sister   . Asthma Maternal Grandmother   . Mental illness Son        PTSD from [employer]  . Parkinsonism Father   . Cancer Sister        stage 4 bladder cancer  . Colon polyps Sister   . Ulcers Other        bleeding gastric  . Heart defect Other        child  . Other Other        perforated bowel, child  . Cancer Brother        pancreatic cancer  . Amblyopia Neg Hx   . Blindness Neg Hx   . Cataracts Neg Hx   . Glaucoma Neg Hx   . Macular degeneration Neg Hx   . Retinal detachment Neg Hx   . Strabismus Neg Hx   . Retinitis pigmentosa Neg Hx     Social History    Socioeconomic History  . Marital status: Widowed    Spouse name: Not on file  . Number of children: 4  . Years of education: Not on file  . Highest education level: Not on file  Occupational History  . Occupation: retired    Fish farm manager: [employer]  Social Needs  . Financial resource strain: Not on file  . Food insecurity:    Worry: Not on file    Inability: Not on file  . Transportation needs:    Medical: Not on file    Non-medical: Not on file  Tobacco Use  . Smoking status: Never Smoker  . Smokeless tobacco: Never Used  Substance and Sexual Activity  . Alcohol use: Yes    Alcohol/week: 0.0 standard drinks    Comment: Rare occasions   . Drug use: No  . Sexual activity: Not Currently    Birth control/protection: Post-menopausal  Lifestyle  . Physical activity:    Days per week: Not on file    Minutes per session: Not on file  . Stress: Not on file  Relationships  . Social connections:    Talks on phone: Not on file    Gets together: Not on file    Attends religious service: Not on file    Active member of club or organization: Not on file    Attends meetings of clubs or organizations: Not on file    Relationship status: Not on file  . Intimate partner violence:    Fear of current or ex partner: Not on file    Emotionally abused: Not on file    Physically abused: Not on file    Forced sexual activity: Not on file  Other Topics Concern  . Not on file  Social History Narrative   No caffeine drinks daily     Outpatient Medications Prior to Visit  Medication Sig Dispense Refill  . albuterol (PROVENTIL HFA;VENTOLIN HFA) 108 (90 Base) MCG/ACT inhaler Inhale 2 puffs into the lungs every 6 (six) hours as needed for wheezing or shortness of breath. 1 Inhaler 2  . Alcohol Swabs (B-D SINGLE USE SWABS REGULAR) PADS Use as directed to check blood sugar. DX E11.9 100 each 3  . atenolol (TENORMIN) 25  MG tablet TAKE 1 TABLET EVERY DAY 90 tablet 3  . atorvastatin (LIPITOR) 80 MG  tablet TAKE 1 TABLET EVERY DAY 90 tablet 0  . cetirizine (ZYRTEC) 10 MG tablet Take 1 tablet (10 mg total) by mouth daily as needed for allergies. 30 tablet 11  . Cholecalciferol (VITAMIN D3) 2000 units TABS Take 1 tablet by mouth daily.    . diclofenac (VOLTAREN) 75 MG EC tablet Take 1 tablet (75 mg total) by mouth 2 (two) times daily. 60 tablet 1  . dicyclomine (BENTYL) 10 MG capsule TAKE 1 CAPSULE BY MOUTH EVERY 4 TO 6 HOURS AS NEEDED FOR ABDOMINAL PAIN AND CRAMPING OR DIARRHEA 60 capsule 1  . diphenoxylate-atropine (LOMOTIL) 2.5-0.025 MG tablet Take 1 tablet by mouth every morning. If continued diarrhea in the next 2-3 days may increase to twice daily. 30 tablet 3  . donepezil (ARICEPT) 10 MG tablet Take 1 tablet (10 mg total) by mouth at bedtime. 30 tablet 0  . escitalopram (LEXAPRO) 20 MG tablet Take 1 tablet (20 mg total) by mouth daily. 30 tablet 0  . fenofibrate 160 MG tablet TAKE 1 TABLET (160 MG TOTAL) BY MOUTH DAILY. 90 tablet 2  . gabapentin (NEURONTIN) 100 MG capsule Take 1 capsule (100 mg total) by mouth 3 (three) times daily. 90 capsule 3  . glimepiride (AMARYL) 2 MG tablet Take 2 tablets (4 mg total) by mouth 2 (two) times daily. 90 tablet 3  . glucose blood (ONETOUCH VERIO) test strip Use as instructed to check blood sugar 2 times a day 150 each 12  . hydrochlorothiazide (HYDRODIURIL) 25 MG tablet Take 1 tablet (25 mg total) by mouth daily. 30 tablet 3  . ketorolac (ACULAR) 0.5 % ophthalmic solution Place 1 drop into the right eye 4 (four) times daily. (Patient taking differently: Place 1 drop into both eyes 4 (four) times daily. ) 10 mL 0  . Lancets Ultra Fine MISC Use to check blood sugar two times a day 200 each 12  . levocetirizine (XYZAL) 5 MG tablet Take 1 tablet (5 mg total) by mouth every evening. 30 tablet 3  . levothyroxine (SYNTHROID, LEVOTHROID) 25 MCG tablet TAKE 1 TABLET DAILY BEFORE BREAKFAST. 90 tablet 0  . losartan (COZAAR) 50 MG tablet TAKE 1 TABLET EVERY DAY 90  tablet 0  . montelukast (SINGULAIR) 10 MG tablet TAKE 1 TABLET (10 MG TOTAL) BY MOUTH AT BEDTIME AS NEEDED (ALLERGIES). 30 tablet 3  . Multiple Vitamins-Minerals (CENTRUM SILVER PO) Take 1 tablet by mouth daily.      . pantoprazole (PROTONIX) 40 MG tablet TAKE 1 TABLET EVERY DAY 90 tablet 0  . prednisoLONE acetate (PRED FORTE) 1 % ophthalmic suspension Place 1 drop into the right eye 4 (four) times daily. 10 mL 0  . Probiotic Product (ALIGN) 4 MG CAPS Take 1 capsule by mouth daily. Reported on 07/02/2015    . sitaGLIPtin (JANUVIA) 100 MG tablet Take 1 tablet (100 mg total) by mouth daily. 90 tablet 3  . triamcinolone cream (KENALOG) 0.1 % Apply 1 application topically 2 (two) times daily.   0  . valACYclovir (VALTREX) 1000 MG tablet TAKE 1 TABLET (1,000 MG TOTAL) BY MOUTH 2 TIMES DAILY. 60 tablet 5  . albuterol (VENTOLIN HFA) 108 (90 Base) MCG/ACT inhaler INHALE 2 PUFFS BY MOUTH INTO THE LUNGS EVERY 4 (FOUR) HOURS AS NEEDED FOR SHORTNESS OF BREATH AND WHEEZING 18 g 1  . fluticasone (FLOVENT HFA) 110 MCG/ACT inhaler Inhale 2 puffs into the lungs 2 (  two) times daily. 1 Inhaler 3   No facility-administered medications prior to visit.     Allergies  Allergen Reactions  . Compazine [Prochlorperazine Edisylate]     Comma for 3 days  . Sulfa Antibiotics Itching    severe itching  . Tetanus Toxoid Other (See Comments)    convulsions    Review of Systems  Constitutional: Positive for malaise/fatigue. Negative for fever.  HENT: Negative for congestion.   Eyes: Negative for blurred vision.  Respiratory: Positive for shortness of breath.   Cardiovascular: Negative for chest pain, palpitations and leg swelling.  Gastrointestinal: Positive for heartburn. Negative for abdominal pain, blood in stool and nausea.  Genitourinary: Negative for dysuria and frequency.  Musculoskeletal: Negative for falls.  Skin: Negative for rash.  Neurological: Negative for dizziness, loss of consciousness and  headaches.  Endo/Heme/Allergies: Negative for environmental allergies.  Psychiatric/Behavioral: Negative for depression. The patient is nervous/anxious.        Objective:    Physical Exam Constitutional:      Appearance: Normal appearance. She is not ill-appearing.  HENT:     Head: Normocephalic and atraumatic.     Right Ear: External ear normal.     Left Ear: External ear normal.     Nose: Nose normal.  Pulmonary:     Effort: Pulmonary effort is normal.  Neurological:     Mental Status: She is alert and oriented to person, place, and time.  Psychiatric:        Mood and Affect: Mood normal.        Behavior: Behavior normal.     Wt 159 lb (72.1 kg)   LMP  (LMP Unknown)   BMI 28.17 kg/m  Wt Readings from Last 3 Encounters:  12/02/18 159 lb (72.1 kg)  11/26/18 155 lb (70.3 kg)  10/27/18 160 lb (72.6 kg)    Diabetic Foot Exam - Simple   No data filed     Lab Results  Component Value Date   WBC 5.7 11/26/2018   HGB 12.7 11/26/2018   HCT 39.4 11/26/2018   PLT 266 11/26/2018   GLUCOSE 150 (H) 11/26/2018   CHOL 142 02/23/2018   TRIG 124.0 02/23/2018   HDL 51.70 02/23/2018   LDLDIRECT 123.0 09/18/2014   LDLCALC 66 02/23/2018   ALT 34 11/26/2018   AST 25 11/26/2018   NA 141 11/26/2018   K 3.9 11/26/2018   CL 107 11/26/2018   CREATININE 0.89 11/26/2018   BUN 18 11/26/2018   CO2 26 11/26/2018   TSH 4.79 (H) 02/23/2018   HGBA1C 9.7 (A) 10/27/2018   MICROALBUR 1.3 04/30/2015    Lab Results  Component Value Date   TSH 4.79 (H) 02/23/2018   Lab Results  Component Value Date   WBC 5.7 11/26/2018   HGB 12.7 11/26/2018   HCT 39.4 11/26/2018   MCV 93.6 11/26/2018   PLT 266 11/26/2018   Lab Results  Component Value Date   NA 141 11/26/2018   K 3.9 11/26/2018   CO2 26 11/26/2018   GLUCOSE 150 (H) 11/26/2018   BUN 18 11/26/2018   CREATININE 0.89 11/26/2018   BILITOT 0.5 11/26/2018   ALKPHOS 43 11/26/2018   AST 25 11/26/2018   ALT 34 11/26/2018   PROT  7.4 11/26/2018   ALBUMIN 4.0 11/26/2018   CALCIUM 9.3 11/26/2018   ANIONGAP 8 11/26/2018   GFR 73.50 02/23/2018   Lab Results  Component Value Date   CHOL 142 02/23/2018   Lab Results  Component Value  Date   HDL 51.70 02/23/2018   Lab Results  Component Value Date   LDLCALC 66 02/23/2018   Lab Results  Component Value Date   TRIG 124.0 02/23/2018   Lab Results  Component Value Date   CHOLHDL 3 02/23/2018   Lab Results  Component Value Date   HGBA1C 9.7 (A) 10/27/2018       Assessment & Plan:   Problem List Items Addressed This Visit    Essential hypertension    no changes to meds. Encouraged heart healthy diet such as the DASH diet and exercise as tolerated.       Asthma - Primary    Worsening symptoms. Refill given on inhalers and new referral placed for follow up with pulmonology      Relevant Medications   albuterol (VENTOLIN HFA) 108 (90 Base) MCG/ACT inhaler   fluticasone (FLOVENT HFA) 110 MCG/ACT inhaler   Other Relevant Orders   Ambulatory referral to Pulmonology   GERD (gastroesophageal reflux disease)    Has had increased symptoms. Avoid offending foods, start probiotics. Do not eat large meals in late evening and consider raising head of bed. Gas X has been helpful      Hypothyroidism    On Levothyroxine, continue to monitor      Relevant Orders   TSH   Lipid panel   Vitamin D deficiency    Supplement and monitor      Relevant Orders   VITAMIN D 25 Hydroxy (Vit-D Deficiency, Fractures)   Left thyroid nodule    Ultrasound ordered to further evaluate       Other Visit Diagnoses    Thyroid nodule       Relevant Orders   TSH   US THYROID   Acute pancreatitis, unspecified complication status, unspecified pancreatitis type       Relevant Orders   Lipase   CBC   Comprehensive metabolic panel   Amylase      I have changed Charm P. Hartsell's fluticasone. I am also having her maintain her Multiple Vitamins-Minerals (CENTRUM SILVER  PO), Align, Vitamin D3, cetirizine, B-D SINGLE USE SWABS REGULAR, donepezil, escitalopram, triamcinolone cream, prednisoLONE acetate, ketorolac, dicyclomine, atorvastatin, diphenoxylate-atropine, valACYclovir, gabapentin, hydrochlorothiazide, atenolol, fenofibrate, pantoprazole, losartan, levothyroxine, diclofenac, glucose blood, Lancets Ultra Fine, sitaGLIPtin, glimepiride, levocetirizine, montelukast, albuterol, and albuterol.  Meds ordered this encounter  Medications  . albuterol (VENTOLIN HFA) 108 (90 Base) MCG/ACT inhaler    Sig: INHALE 2 PUFFS BY MOUTH INTO THE LUNGS EVERY 4 (FOUR) HOURS AS NEEDED FOR SHORTNESS OF BREATH AND WHEEZING    Dispense:  18 g    Refill:  1  . fluticasone (FLOVENT HFA) 110 MCG/ACT inhaler    Sig: Inhale 1-2 puffs into the lungs 2 (two) times daily.    Dispense:  1 Inhaler    Refill:  3    I discussed the assessment and treatment plan with the patient. The patient was provided an opportunity to ask questions and all were answered. The patient agreed with the plan and demonstrated an understanding of the instructions.   The patient was advised to call back or seek an in-person evaluation if the symptoms worsen or if the condition fails to improve as anticipated.  I provided 25 minutes of non-face-to-face time during this encounter.   Penni Homans, MD

## 2018-12-06 NOTE — Assessment & Plan Note (Signed)
Supplement and monitor 

## 2018-12-06 NOTE — Assessment & Plan Note (Signed)
Has had increased symptoms. Avoid offending foods, start probiotics. Do not eat large meals in late evening and consider raising head of bed. Gas X has been helpful

## 2018-12-07 ENCOUNTER — Encounter: Payer: Self-pay | Admitting: Podiatry

## 2018-12-07 NOTE — Progress Notes (Signed)
Subjective:  Crystal [name] presents to clinic today with cc of  painful, thick, discolored, elongated toenails 1-5 b/l that become tender and cannot cut because of thickness.  Pain is aggravated when wearing enclosed shoe gear.  She relates a sore 4th toe left foot. She feels toenail from 5th digit irritated her toe.   Crystal Lukes, MD is her PCP and last visit was 12/02/2018.   Current Outpatient Medications:  .  albuterol (PROVENTIL HFA;VENTOLIN HFA) 108 (90 Base) MCG/ACT inhaler, Inhale 2 puffs into the lungs every 6 (six) hours as needed for wheezing or shortness of breath., Disp: 1 Inhaler, Rfl: 2 .  albuterol (VENTOLIN HFA) 108 (90 Base) MCG/ACT inhaler, INHALE 2 PUFFS BY MOUTH INTO THE LUNGS EVERY 4 (FOUR) HOURS AS NEEDED FOR SHORTNESS OF BREATH AND WHEEZING, Disp: 18 g, Rfl: 1 .  Alcohol Swabs (B-D SINGLE USE SWABS REGULAR) PADS, Use as directed to check blood sugar. DX E11.9, Disp: 100 each, Rfl: 3 .  atenolol (TENORMIN) 25 MG tablet, TAKE 1 TABLET EVERY DAY, Disp: 90 tablet, Rfl: 3 .  atorvastatin (LIPITOR) 80 MG tablet, TAKE 1 TABLET EVERY DAY, Disp: 90 tablet, Rfl: 0 .  cetirizine (ZYRTEC) 10 MG tablet, Take 1 tablet (10 mg total) by mouth daily as needed for allergies., Disp: 30 tablet, Rfl: 11 .  Cholecalciferol (VITAMIN D3) 2000 units TABS, Take 1 tablet by mouth daily., Disp: , Rfl:  .  diclofenac (VOLTAREN) 75 MG EC tablet, Take 1 tablet (75 mg total) by mouth 2 (two) times daily., Disp: 60 tablet, Rfl: 1 .  dicyclomine (BENTYL) 10 MG capsule, TAKE 1 CAPSULE BY MOUTH EVERY 4 TO 6 HOURS AS NEEDED FOR ABDOMINAL PAIN AND CRAMPING OR DIARRHEA, Disp: 60 capsule, Rfl: 1 .  diphenoxylate-atropine (LOMOTIL) 2.5-0.025 MG tablet, Take 1 tablet by mouth every morning. If continued diarrhea in the next 2-3 days may increase to twice daily., Disp: 30 tablet, Rfl: 3 .  donepezil (ARICEPT) 10 MG tablet, Take 1 tablet (10 mg total) by mouth at bedtime., Disp: 30 tablet, Rfl: 0 .   escitalopram (LEXAPRO) 20 MG tablet, Take 1 tablet (20 mg total) by mouth daily., Disp: 30 tablet, Rfl: 0 .  fenofibrate 160 MG tablet, TAKE 1 TABLET (160 MG TOTAL) BY MOUTH DAILY., Disp: 90 tablet, Rfl: 2 .  fluticasone (FLOVENT HFA) 110 MCG/ACT inhaler, Inhale 1-2 puffs into the lungs 2 (two) times daily., Disp: 1 Inhaler, Rfl: 3 .  gabapentin (NEURONTIN) 100 MG capsule, Take 1 capsule (100 mg total) by mouth 3 (three) times daily., Disp: 90 capsule, Rfl: 3 .  glimepiride (AMARYL) 2 MG tablet, Take 2 tablets (4 mg total) by mouth 2 (two) times daily., Disp: 90 tablet, Rfl: 3 .  glucose blood (ONETOUCH VERIO) test strip, Use as instructed to check blood sugar 2 times a day, Disp: 150 each, Rfl: 12 .  hydrochlorothiazide (HYDRODIURIL) 25 MG tablet, Take 1 tablet (25 mg total) by mouth daily., Disp: 30 tablet, Rfl: 3 .  ketorolac (ACULAR) 0.5 % ophthalmic solution, Place 1 drop into the right eye 4 (four) times daily. (Patient taking differently: Place 1 drop into both eyes 4 (four) times daily. ), Disp: 10 mL, Rfl: 0 .  Lancets Ultra Fine MISC, Use to check blood sugar two times a day, Disp: 200 each, Rfl: 12 .  levocetirizine (XYZAL) 5 MG tablet, Take 1 tablet (5 mg total) by mouth every evening., Disp: 30 tablet, Rfl: 3 .  levothyroxine (SYNTHROID, LEVOTHROID) 25  MCG tablet, TAKE 1 TABLET DAILY BEFORE BREAKFAST., Disp: 90 tablet, Rfl: 0 .  losartan (COZAAR) 50 MG tablet, TAKE 1 TABLET EVERY DAY, Disp: 90 tablet, Rfl: 0 .  montelukast (SINGULAIR) 10 MG tablet, TAKE 1 TABLET (10 MG TOTAL) BY MOUTH AT BEDTIME AS NEEDED (ALLERGIES)., Disp: 30 tablet, Rfl: 3 .  Multiple Vitamins-Minerals (CENTRUM SILVER PO), Take 1 tablet by mouth daily.  , Disp: , Rfl:  .  pantoprazole (PROTONIX) 40 MG tablet, TAKE 1 TABLET EVERY DAY, Disp: 90 tablet, Rfl: 0 .  prednisoLONE acetate (PRED FORTE) 1 % ophthalmic suspension, Place 1 drop into the right eye 4 (four) times daily., Disp: 10 mL, Rfl: 0 .  Probiotic Product  (ALIGN) 4 MG CAPS, Take 1 capsule by mouth daily. Reported on 07/02/2015, Disp: , Rfl:  .  sitaGLIPtin (JANUVIA) 100 MG tablet, Take 1 tablet (100 mg total) by mouth daily., Disp: 90 tablet, Rfl: 3 .  triamcinolone cream (KENALOG) 0.1 %, Apply 1 application topically 2 (two) times daily. , Disp: , Rfl: 0 .  valACYclovir (VALTREX) 1000 MG tablet, TAKE 1 TABLET (1,000 MG TOTAL) BY MOUTH 2 TIMES DAILY., Disp: 60 tablet, Rfl: 5   Allergies  Allergen Reactions  . Compazine [Prochlorperazine Edisylate]     Comma for 3 days  . Sulfa Antibiotics Itching    severe itching  . Tetanus Toxoid Other (See Comments)    convulsions     Objective: Vitals:   11/30/18 1605  Temp: (!) 97.2 F (36.2 C)    Physical Examination:  Neurovascular Examination: Capillary refill time <3 seconds x 10 digits.  Palpable DP/PT pulses b/l.  Digital hair present b/l.  No edema noted b/l.  Skin temperature gradient WNL b/l.  Dermatological Examination: Skin with normal turgor, texture and tone b/l.  Left 4th digit with scab noted. Adjacent 5th digit nail impinging lateral aspect of 4th toe.  No erythema, no edema, no drainage, no flocculence.  No interdigital macerations noted b/l.  Elongated, thick, discolored brittle toenails with subungual debris and pain on dorsal palpation of nailbeds 1-5 b/l.  Musculoskeletal Examination: Muscle strength 5/5 to all muscle groups b/l  No pain, crepitus or joint discomfort with active/passive ROM.  Neurological Examination: Sensation intact 5/5 b/l with 10 gram monofilament.  Vibratory sensation intact b/l.  Proprioceptive sensation intact b/l.  Assessment: Mycotic nail infection with pain 1-5 b/l Laceration left 4th toe  Plan: 1. Toenails 1-5 b/l were debrided in length and girth without iatrogenic laceration. 2. Patient instructed to apply TAO to right 4th toe once daily for one week. 3. Continue soft, supportive shoe gear daily. 4. Report any pedal  injuries to medical professional. 5. Follow up 3 months. 6. Patient/POA to call should there be a question/concern in there interim.

## 2018-12-09 ENCOUNTER — Ambulatory Visit (HOSPITAL_BASED_OUTPATIENT_CLINIC_OR_DEPARTMENT_OTHER)
Admission: RE | Admit: 2018-12-09 | Discharge: 2018-12-09 | Disposition: A | Discharge status: Home / Self Care | Payer: Medicare HMO | Source: Ambulatory Visit | Attending: Family Medicine | Admitting: Family Medicine

## 2018-12-09 ENCOUNTER — Other Ambulatory Visit: Payer: Self-pay

## 2018-12-09 DIAGNOSIS — E041 Nontoxic single thyroid nodule: Secondary | ICD-10-CM | POA: Insufficient documentation

## 2018-12-13 ENCOUNTER — Other Ambulatory Visit: Payer: Self-pay | Admitting: Family Medicine

## 2018-12-13 DIAGNOSIS — E041 Nontoxic single thyroid nodule: Secondary | ICD-10-CM

## 2018-12-15 DIAGNOSIS — E041 Nontoxic single thyroid nodule: Secondary | ICD-10-CM | POA: Diagnosis not present

## 2018-12-15 NOTE — Progress Notes (Signed)
@Patient  ID: Crystal [name], female    DOB: [birth year], 68 y.o.   MRN: [id number]  Chief Complaint  Patient presents with   Follow-up    short of breath     Referring provider: [name], MD  HPI:  68 year old female never smoker initially referred to our office on 03/26/2018 for evaluation of restarting CPAP therapy for history of OSA.  Patient also has asthma which is primarily been managed by her PCP but is presenting today on 12/16/2018 for further evaluation with her breathing.  PMH: OSA, diabetes type 2, hyperlipidemia, depression anxiety, hypertension, GERD, hypothyroidism Smoker/ Smoking History: Never smoker Maintenance: Flovent 110 Pt of: Dr. Ander Slade  12/16/2018  - Visit   68 year old female never smoker presenting back to our office today after initially being consulted in September/2019.  September 2019 consult office visit focused primarily around restarting patient on CPAP therapy.  A home sleep study was ordered but this was not completed.  A pulmonary function test was ordered but this was also not completed.  We will correct that today.  The patient is presenting today after presenting to the emergency room earlier this month with shortness of breath with a suggest that she needed pulmonary function testing to help better gauge her asthma.  She also has followed up with primary care who refilled the patient's inhalers.  Patient presenting today reporting that her shortness of breath has been worsening she does not feel like her breathing is well managed.  She reports that the inhaler that she has on her today is the inhaler she has been using twice a day to help manage her breathing.  Unfortunately the inhaler that she has on hand today that she has been using as a maintenance inhaler is Ventolin and is been expired since 2018.  Patient is supposed to be managed on Flovent 110.  She is unsure if she has been taking that regularly.  It does not sound like the patient  has been using her inhalers regularly as prescribed.  We can further clarify this today and work on it.  Patient believes she is taking allergy medication but she cannot tell me which one.  She reports she is not taking Xyzal.  She also admits that she is not taking these regularly.  She reports that she takes them as needed.  Patient is also having worsening GERD-like symptoms about 3 times a week.  Patient is taking Protonix.   Tests:   11/26/2018-CBC with differential- eosinophils absolute 0.1  11/26/2018-CTA- no PE, 1.5 x 1.2 cm nodular lesion in the left lobe of the thyroid, stable small nodular opacities in the right lung, no pleural effusion  02/08/2010- pulmonary function test- FVC 2.38 (75% predicted), postbronchodilator ratio 90, postbronchodilator FEV1 2.22 (90% predicted), improvement to FEV1 after bronchodilator, improvement to mid flow reversibility, no change after bronchodilator with FVC, DLCO 198, lungs hyperinflated  Results of the Epworth flowsheet 12/16/2018 03/26/2018  Sitting and reading 0 2  Watching TV 2 2  Sitting, inactive in a public place (e.g. a theatre or a meeting) 2 0  As a passenger in a car for an hour without a break 3 3  Lying down to rest in the afternoon when circumstances permit 3 3  Sitting and talking to someone 0 0  Sitting quietly after a lunch without alcohol 3 2  In a car, while stopped for a few minutes in traffic 0 0  Total score 13 12     FENO:  No results found for: NITRICOXIDE  PFT: No flowsheet data found.  Imaging: Dg Chest 2 View  Result Date: 11/26/2018 CLINICAL DATA:  Shortness of breath EXAM: CHEST - 2 VIEW COMPARISON:  05/17/2018 FINDINGS: The heart size and mediastinal contours are within normal limits. Both lungs are clear. The visualized skeletal structures are unremarkable. IMPRESSION: No acute abnormality of the lungs. Electronically Signed   By: Eddie Candle M.D.   On: 11/26/2018 17:48   Ct Angio Chest Pe W/cm &/or Wo  Cm  Result Date: 11/26/2018 CLINICAL DATA:  Shortness of breath and chest pain EXAM: CT ANGIOGRAPHY CHEST WITH CONTRAST TECHNIQUE: Multidetector CT imaging of the chest was performed using the standard protocol during bolus administration of intravenous contrast. Multiplanar CT image reconstructions and MIPs were obtained to evaluate the vascular anatomy. CONTRAST:  130mL OMNIPAQUE IOHEXOL 350 MG/ML SOLN COMPARISON:  Chest CT angiogram June 02, 2018 FINDINGS: Cardiovascular: There is no demonstrable pulmonary embolus. There is no thoracic aortic aneurysm or dissection. The visualized great vessels appear unremarkable. There is a degree of aortic atherosclerosis. There is no pericardial effusion or pericardial thickening evident. Mediastinum/Nodes: There is a nodular lesion in the left lobe of the thyroid measuring 1.5 x 1.2 cm. There are several subcentimeter mediastinal lymph nodes. No adenopathy is evident in the thoracic region by size criteria. No esophageal lesions are evident. Lungs/Pleura: There is no parenchymal lung edema or consolidation. There is a stable 2 mm nodular opacity in the medial segment right middle lobe seen on axial slice 55 series 7. A 3 mm nodular opacity abutting the pleura in the anterior segment right upper lobe near the apex on axial slice 17 series 7 is stable. No new parenchymal lung lesions are evident. There is no pleural effusion or pleural thickening. Upper Abdomen: Postoperative changes noted in the visualized upper abdomen. There is an apparent left adrenal adenoma measuring 1.3 x 1.3 cm, stable. Visualized upper abdominal structures otherwise appear unremarkable. Musculoskeletal: There are no blastic or lytic bone lesions. There is degenerative change in the thoracic spine. No chest wall lesions are evident. Review of the MIP images confirms the above findings. IMPRESSION: 1. No demonstrable pulmonary embolus. No thoracic aortic aneurysm or dissection. There is aortic  atherosclerosis. 2. **An incidental finding of potential clinical significance has been found. 1.5 x 1.2 cm nodular lesion in the left lobe of the thyroid. Consider further evaluation with thyroid ultrasound nonemergently. If patient is clinically hyperthyroid, consider nuclear medicine thyroid uptake and scan.** 3.  No demonstrable thoracic adenopathy by size criteria. 4. No lung edema or consolidation. Stable small nodular opacities in the right lung. No pleural effusion. 5.  Stable postoperative change upper abdomen. 6.  Stable benign left adrenal adenoma. Aortic Atherosclerosis (ICD10-I70.0). Electronically Signed   By: Lowella Grip III M.D.   On: 11/26/2018 21:42   US Thyroid  Result Date: 12/09/2018 CLINICAL DATA:  Incidental on CT. Left-sided thyroid nodule incidentally noted on chest CT. EXAM: THYROID ULTRASOUND TECHNIQUE: Ultrasound examination of the thyroid gland and adjacent soft tissues was performed. COMPARISON:  Chest CT-11/27/2018 FINDINGS: Parenchymal Echotexture: Normal Isthmus: Normal in size measures 0.4 cm in diameter Right lobe: Normal in size measuring 4.1 x 1.6 x 1.5 cm Left lobe: Normal in size measuring 3.9 x 2.2 x 1.5 cm _________________________________________________________ Estimated total number of nodules >/= 1 cm: 1 Number of spongiform nodules >/=  2 cm not described below (TR1): 0 Number of mixed cystic and solid nodules >/= 1.5 cm not described  below ([place]): 0 _________________________________________________________ There is a punctate (approximately 0.3 cm) anechoic cyst within the superior pole the right lobe of the thyroid which contains an internal echogenic foci with ring down artifact compatible with benign colloid. This punctate benign colloid containing cyst does not meet imaging criteria to recommend percutaneous sampling or continued dedicated follow-up There are 2 ill-defined punctate (sub 0.5 cm) isoechoic nodules/pseudonodules within the superior pole the  right lobe of the thyroid (labeled 1 and 2), neither of which meet imaging criteria to recommend percutaneous sampling or continued dedicated follow-up. _________________________________________________________ There is a approximately 0.5 cm isoechoic nodule/pseudonodule within the superior pole the left lobe of the thyroid (labeled 3), which does not meet imaging criteria to recommend percutaneous sampling or continued dedicated follow-up. _________________________________________________________ Nodule # 4: Location: Left; Inferior - this nodule correlates with the nodule questioned on preceding chest CT. Maximum size: 2.3 cm; Other 2 dimensions: 1.6 x 1.3 cm Composition: solid/almost completely solid (2) Echogenicity: isoechoic (1) Shape: taller-than-wide (3) Margins: smooth (0) Echogenic foci: none (0) ACR TI-RADS total points: 6. ACR TI-RADS risk category: TR4 (4-6 points). ACR TI-RADS recommendations: **Given size (>/= 1.5 cm) and appearance, fine needle aspiration of this moderately suspicious nodule should be considered based on TI-RADS criteria. _________________________________________________________ Scattered additional punctate (sub 4 mm) anechoic cysts within the left lobe of the thyroid do not meet imaging criteria to recommend percutaneous sampling or continued dedicated follow-up. Scattered bilateral cervical lymph nodes are numerous though individually not enlarged by size criteria with index right cervical lymph node measuring 0.6 cm in greatest short axis diameter (image 31) and index left sided cervical lymph node measures 0.5 cm (image 75), and both maintaining benign fatty hilum, presumably reactive in etiology. IMPRESSION: 1. Findings suggestive of multinodular goiter. 2. Nodule #4, correlating with the nodule seen on preceding chest CT, meets imaging criteria to recommend percutaneous sampling as clinically indicated. The above is in keeping with the ACR TI-RADS recommendations - J Am Coll  Radiol 2017;14:587-595. Electronically Signed   By: Sandi Mariscal M.D.   On: 12/09/2018 14:34      Specialty Problems      Pulmonary Problems   Asthma    Mild, intermittent       Obstructive sleep apnea    2011 :[facility] >>AHI 37 corrected by CPAP 11 cm       Allergic rhinitis   Sinusitis      Allergies  Allergen Reactions   Compazine [Prochlorperazine Edisylate]     Comma for 3 days   Sulfa Antibiotics Itching    severe itching   Tetanus Toxoid Other (See Comments)    convulsions    Immunization History  Administered Date(s) Administered   Influenza Split 05/26/2011, 03/31/2012   Influenza Whole 03/27/2010   Influenza, High Dose Seasonal PF 04/02/2017   Influenza,inj,Quad PF,6+ Mos 03/29/2013, 04/13/2014, 04/02/2015, 04/23/2016, 03/16/2018   Pneumococcal Conjugate-13 04/25/2013   Pneumococcal Polysaccharide-23 07/14/2004, 08/03/2015   Zoster 12/27/2013    Past Medical History:  Diagnosis Date   Acute peptic ulcer of stomach    As a teenager   Allergy    dust, cock roach,grass,weeds,molds   Anxiety    Arthritis    knees, hands, shoulders   Asthma    02/08/10 FEV1 1.98 (80%), s/p saba 2.22 l/m (90%).nml   Broken ankle    right foot, wearing a boot   Cognitive change 06/10/2016   Dementia ([place])    Depression    Diabetes mellitus 2003   Type  II   Diverticulosis    Dyspepsia    Fatty liver 12/19/2010   GERD (gastroesophageal reflux disease)    Hearing loss    bilateral hearing aids    Herpes simplex virus (HSV) infection    High cholesterol    Hyperlipemia    Hypertension    Hypothyroidism 09/08/2013   IBS (irritable bowel syndrome)    Insomnia    Internal hemorrhoids    Joint pain 08/08/2016   Low back pain 08/10/2016   OSA (obstructive sleep apnea)    Does not use CPAP - old one broken, new sleep study to be scheduled and then given a new CPAP machine   Plantar fasciitis    no longer an issue    Sun-damaged skin 12/09/2016   SVD (spontaneous vaginal delivery)    x 4   Vaginal Pap smear, abnormal    Vitamin D deficiency 03/04/2016   Wears partial dentures    upper dentures and lower partial    Tobacco History: Social History   Tobacco Use  Smoking Status Never Smoker  Smokeless Tobacco Never Used   Counseling given: Not Answered   Continue to not smoke  Outpatient Encounter Medications as of 12/16/2018  Medication Sig   albuterol (PROVENTIL HFA;VENTOLIN HFA) 108 (90 Base) MCG/ACT inhaler Inhale 2 puffs into the lungs every 6 (six) hours as needed for wheezing or shortness of breath.   albuterol (VENTOLIN HFA) 108 (90 Base) MCG/ACT inhaler INHALE 2 PUFFS BY MOUTH INTO THE LUNGS EVERY 4 (FOUR) HOURS AS NEEDED FOR SHORTNESS OF BREATH AND WHEEZING   Alcohol Swabs (B-D SINGLE USE SWABS REGULAR) PADS Use as directed to check blood sugar. DX E11.9   atenolol (TENORMIN) 25 MG tablet TAKE 1 TABLET EVERY DAY   atorvastatin (LIPITOR) 80 MG tablet Take 1 tablet (80 mg total) by mouth daily.   cetirizine (ZYRTEC) 10 MG tablet Take 1 tablet (10 mg total) by mouth daily as needed for allergies.   Cholecalciferol (VITAMIN D3) 2000 units TABS Take 1 tablet by mouth daily.   diclofenac (VOLTAREN) 75 MG EC tablet Take 1 tablet (75 mg total) by mouth 2 (two) times daily.   dicyclomine (BENTYL) 10 MG capsule TAKE 1 CAPSULE BY MOUTH EVERY 4 TO 6 HOURS AS NEEDED FOR ABDOMINAL PAIN AND CRAMPING OR DIARRHEA   diphenoxylate-atropine (LOMOTIL) 2.5-0.025 MG tablet Take 1 tablet by mouth every morning. If continued diarrhea in the next 2-3 days may increase to twice daily.   donepezil (ARICEPT) 10 MG tablet Take 1 tablet (10 mg total) by mouth at bedtime.   escitalopram (LEXAPRO) 20 MG tablet Take 1 tablet (20 mg total) by mouth daily.   fenofibrate 160 MG tablet TAKE 1 TABLET (160 MG TOTAL) BY MOUTH DAILY.   fluticasone (FLOVENT HFA) 110 MCG/ACT inhaler Inhale 1-2 puffs into the lungs 2  (two) times daily.   gabapentin (NEURONTIN) 100 MG capsule Take 1 capsule (100 mg total) by mouth 3 (three) times daily.   glimepiride (AMARYL) 2 MG tablet Take 2 tablets (4 mg total) by mouth 2 (two) times daily.   glucose blood (ONETOUCH VERIO) test strip Use as instructed to check blood sugar 2 times a day   hydrochlorothiazide (HYDRODIURIL) 25 MG tablet Take 1 tablet (25 mg total) by mouth daily.   ketorolac (ACULAR) 0.5 % ophthalmic solution Place 1 drop into the right eye 4 (four) times daily. (Patient taking differently: Place 1 drop into both eyes 4 (four) times daily. )   Lancets  Ultra Fine MISC Use to check blood sugar two times a day   levothyroxine (SYNTHROID, LEVOTHROID) 25 MCG tablet TAKE 1 TABLET DAILY BEFORE BREAKFAST.   losartan (COZAAR) 50 MG tablet TAKE 1 TABLET EVERY DAY   montelukast (SINGULAIR) 10 MG tablet TAKE 1 TABLET (10 MG TOTAL) BY MOUTH AT BEDTIME AS NEEDED (ALLERGIES).   Multiple Vitamins-Minerals (CENTRUM SILVER PO) Take 1 tablet by mouth daily.     pantoprazole (PROTONIX) 40 MG tablet TAKE 1 TABLET EVERY DAY   prednisoLONE acetate (PRED FORTE) 1 % ophthalmic suspension Place 1 drop into the right eye 4 (four) times daily.   Probiotic Product (ALIGN) 4 MG CAPS Take 1 capsule by mouth daily. Reported on 07/02/2015   sitaGLIPtin (JANUVIA) 100 MG tablet Take 1 tablet (100 mg total) by mouth daily.   triamcinolone cream (KENALOG) 0.1 % Apply 1 application topically 2 (two) times daily.    valACYclovir (VALTREX) 1000 MG tablet TAKE 1 TABLET (1,000 MG TOTAL) BY MOUTH 2 TIMES DAILY.   budesonide-formoterol (SYMBICORT) 80-4.5 MCG/ACT inhaler Inhale 2 puffs into the lungs 2 (two) times a day.   [DISCONTINUED] atorvastatin (LIPITOR) 80 MG tablet TAKE 1 TABLET EVERY DAY   [DISCONTINUED] levocetirizine (XYZAL) 5 MG tablet Take 1 tablet (5 mg total) by mouth every evening. (Patient not taking: Reported on 12/16/2018)   No facility-administered encounter  medications on file as of 12/16/2018.      Review of Systems  Review of Systems  Constitutional: Positive for activity change and fatigue. Negative for appetite change, chills and fever.  HENT: Positive for congestion, postnasal drip, rhinorrhea, sinus pressure and sinus pain.   Respiratory: Positive for chest tightness, shortness of breath and wheezing. Negative for cough.   Cardiovascular: Positive for chest pain (believes cp is gerd) and palpitations (with inhaler usage). Negative for leg swelling.  Gastrointestinal: Negative for diarrhea, nausea and vomiting.       Gerd symptoms - 3x daily  Allergic/Immunologic: Positive for environmental allergies (all the time, worse in spring and fall ). Negative for food allergies.  Psychiatric/Behavioral: Negative for dysphoric mood. The patient is not nervous/anxious.      Physical Exam  BP 138/78    Pulse 87    Ht 5\' 2"  (1.575 m)    Wt 158 lb 6.4 oz (71.8 kg)    LMP  (LMP Unknown)    SpO2 94%    BMI 28.97 kg/m   Wt Readings from Last 5 Encounters:  12/16/18 158 lb 6.4 oz (71.8 kg)  12/02/18 159 lb (72.1 kg)  11/26/18 155 lb (70.3 kg)  10/27/18 160 lb (72.6 kg)  09/27/18 156 lb (70.8 kg)     Physical Exam  Constitutional: She is oriented to person, place, and time and well-developed, well-nourished, and in no distress. No distress.  HENT:  Head: Normocephalic and atraumatic.  Right Ear: Hearing, tympanic membrane, external ear and ear canal normal.  Left Ear: Hearing, tympanic membrane, external ear and ear canal normal.  Nose: Mucosal edema and rhinorrhea present.  Mouth/Throat: Uvula is midline and oropharynx is clear and moist. No oropharyngeal exudate.  Cerumen in both canals bilaterally, difficult to visualize TMs Patient reporting that right tympanic membrane has a hole in it Hearing aids bilaterally Postnasal drip  Eyes: Pupils are equal, round, and reactive to light.  Neck: Normal range of motion. Neck supple.    Cardiovascular: Normal rate, regular rhythm and normal heart sounds.  Pulmonary/Chest: Effort normal and breath sounds normal. No accessory muscle usage.  No respiratory distress. She has no decreased breath sounds. She has no wheezes. She has no rhonchi.  Abdominal: Soft. Bowel sounds are normal. She exhibits no distension. There is no abdominal tenderness.  Musculoskeletal: Normal range of motion.        General: No edema.  Lymphadenopathy:    She has no cervical adenopathy.  Neurological: She is alert and oriented to person, place, and time. Gait normal.  Skin: Skin is warm and dry. She is not diaphoretic. No erythema.  Psychiatric: Mood, memory, affect and judgment normal. Her mood appears not anxious. She does not exhibit a depressed mood.  Nursing note and vitals reviewed.     Lab Results:  CBC    Component Value Date/Time   WBC 5.7 11/26/2018 1925   RBC 4.21 11/26/2018 1925   HGB 12.7 11/26/2018 1925   HGB 12.8 06/02/2018 1216   HCT 39.4 11/26/2018 1925   HCT 38.0 06/02/2018 1216   PLT 266 11/26/2018 1925   PLT 332 06/02/2018 1216   MCV 93.6 11/26/2018 1925   MCV 91 06/02/2018 1216   MCH 30.2 11/26/2018 1925   MCHC 32.2 11/26/2018 1925   RDW 12.9 11/26/2018 1925   RDW 12.1 (L) 06/02/2018 1216   LYMPHSABS 2.2 11/26/2018 1925   MONOABS 0.5 11/26/2018 1925   EOSABS 0.1 11/26/2018 1925   BASOSABS 0.0 11/26/2018 1925    BMET    Component Value Date/Time   NA 141 11/26/2018 1925   NA 145 (H) 06/02/2018 1216   K 3.9 11/26/2018 1925   CL 107 11/26/2018 1925   CO2 26 11/26/2018 1925   GLUCOSE 150 (H) 11/26/2018 1925   BUN 18 11/26/2018 1925   BUN 13 06/02/2018 1216   CREATININE 0.89 11/26/2018 1925   CREATININE 0.72 12/27/2013 1628   CALCIUM 9.3 11/26/2018 1925   GFRNONAA >60 11/26/2018 1925   GFRNONAA 71 08/15/2013 1636   GFRAA >60 11/26/2018 1925   GFRAA 81 08/15/2013 1636    BNP    Component Value Date/Time   BNP 90.4 11/26/2018 1925    ProBNP     Component Value Date/Time   PROBNP 7.7 06/18/2010 2142      Assessment & Plan:   Obstructive sleep apnea Assessment: 2011 per [facility] patient's AHI was 37, she is managed on CPAP at set pressure of 11 Patient stopped using CPAP around 2015 Patient is interested in resuming CPAP therapy Epworth score today is 13  Plan: We will order home sleep study  Asthma Assessment: Hyperinflation and larger lung volumes on 2011 pulmonary function test Mid flow reversibility as well as FEV1 reversibility on 2011 pulmonary function test Supposed to be managed on Flovent 110, patient has not been taking this correctly and has been using her Ventolin as her maintenance inhaler twice daily The Ventolin that she has been using as her maintenance inhaler expired in 2018  Plan: Patient does have worsening shortness of breath this is likely due to the fact that the patient is having flares of allergies as well as her inability to use maintenance inhalers correctly  Stop Flovent Trial of Symbicort 80 today Contact our office in 1 week and let us know how you are doing on Symbicort 80 Follow-up with our office in 4 weeks Throw out Ventolin expired inhaler that you have in office today Contact our office when you get home and let us know that the rescue inhaler that you have at home is not expired Contact our office when you go  home and let us know what allergy medication you are using Follow-up with our office in 4 weeks or sooner We will order pulmonary function testing but due to COVID-19 restrictions we may not be able to get this completed until about 8 to 12 weeks from now will need COVID testing prior May need to consider referral to [facility] case management to help with asthma management as well as inhalers if patient cannot afford Symbicort 80 inhaler  Allergic rhinitis Plan: Continue Zyrtec daily We can reevaluate in 4 weeks    Return in about 4 weeks (around 01/13/2019), or if  symptoms worsen or fail to improve, for Follow up with Wyn Quaker FNP-C.   Lauraine Rinne, NP 12/16/2018   This appointment was 30 minutes long with over 50% of the time in direct face-to-face patient care, assessment, plan of care, and follow-up.

## 2018-12-16 ENCOUNTER — Ambulatory Visit: Payer: Medicare HMO | Admitting: Pulmonary Disease

## 2018-12-16 ENCOUNTER — Other Ambulatory Visit: Payer: Self-pay

## 2018-12-16 ENCOUNTER — Encounter: Payer: Self-pay | Admitting: Pulmonary Disease

## 2018-12-16 VITALS — BP 138/78 | HR 87 | Ht 62.0 in | Wt 158.4 lb

## 2018-12-16 DIAGNOSIS — G4733 Obstructive sleep apnea (adult) (pediatric): Secondary | ICD-10-CM

## 2018-12-16 DIAGNOSIS — J4530 Mild persistent asthma, uncomplicated: Secondary | ICD-10-CM

## 2018-12-16 DIAGNOSIS — J309 Allergic rhinitis, unspecified: Secondary | ICD-10-CM

## 2018-12-16 MED ORDER — BUDESONIDE-FORMOTEROL FUMARATE 80-4.5 MCG/ACT IN AERO: 2.0000 | INHALATION_SPRAY | Freq: Two times a day (BID) | RESPIRATORY_TRACT | 0 refills | Status: DC

## 2018-12-16 MED ORDER — ATORVASTATIN CALCIUM 80 MG PO TABS: 80.0000 mg | ORAL_TABLET | Freq: Every day | ORAL | 1 refills | Status: DC

## 2018-12-16 NOTE — Patient Instructions (Addendum)
Continue Symbicort 80 >>> 2 puffs in the morning right when you wake up, rinse out your mouth after use, 12 hours later 2 puffs, rinse after use >>> Take this daily, no matter what >>> This is not a rescue inhaler  >>>STOP FLOVENT when using  Call us in a week and let us know how you are doing on this inhaler   Only use your albuterol as a rescue medication to be used if you can't catch your breath by resting or doing a relaxed purse lip breathing pattern.  - The less you use it, the better it will work when you need it. - Ok to use up to 2 puffs  every 4 hours if you must but call for immediate appointment if use goes up over your usual need - Don't leave home without it !!  (think of it like the spare tire for your car)    Please also contact us when you get home to let us know that your rescue inhaler (I believe this should be the red inhaler that you have at home) is an albuterol inhaler and is not expired. Remember to put your Flovent (this should be repeated inhaler) up on the shelf as she will not be using it as we try you on Symbicort 80   We have ordered a pulmonary function test this may not be completed for 8 to 12 weeks as COVID-19 restrictions have limited our pulmonary function testing >>>Thankfully we have a 2011 PFT on you from [facility]  We will order a home sleep study since this was never completed in Sept / 2019   Return in about 4 weeks (around 01/13/2019), or if symptoms worsen or fail to improve, for Follow up with Wyn Quaker FNP-C.    Coronavirus (COVID-19) Are you at risk?  Are you at risk for the Coronavirus (COVID-19)?  To be considered HIGH RISK for Coronavirus (COVID-19), you have to meet the following criteria:  . Traveled to [place], [place], [place], [place] or [place]; or in the [place] to [place], [place], [place], or [place]; and have fever, cough, and shortness of breath within the last 2 weeks of travel OR . Been in close  contact with a person diagnosed with COVID-19 within the last 2 weeks and have fever, cough, and shortness of breath . IF YOU DO NOT MEET THESE CRITERIA, YOU ARE CONSIDERED LOW RISK FOR COVID-19.  What to do if you are HIGH RISK for COVID-19?  Marland Kitchen If you are having a medical emergency, call 911. . Seek medical care right away. Before you go to a doctor's office, urgent care or emergency department, call ahead and tell them about your recent travel, contact with someone diagnosed with COVID-19, and your symptoms. You should receive instructions from your physician's office regarding next steps of care.  . When you arrive at healthcare provider, tell the healthcare staff immediately you have returned from visiting [place], [place], [place], [place] or [place]; or traveled in the [place] to [place], [place], [place], or [place]; in the last two weeks or you have been in close contact with a person diagnosed with COVID-19 in the last 2 weeks.   . Tell the health care staff about your symptoms: fever, cough and shortness of breath. . After you have been seen by a medical provider, you will be either: o Tested for (COVID-19) and discharged home on quarantine except to seek medical care if symptoms  worsen, and asked to  - Stay home and avoid contact with others until you get your results (4-5 days)  - Avoid travel on public transportation if possible (such as bus, train, or airplane) or o Sent to the Emergency Department by EMS for evaluation, COVID-19 testing, and possible admission depending on your condition and test results.  What to do if you are LOW RISK for COVID-19?  Reduce your risk of any infection by using the same precautions used for avoiding the common cold or flu:  Marland Kitchen Wash your hands often with soap and warm water for at least 20 seconds.  If soap and water are not readily available, use an alcohol-based hand sanitizer with at least 60% alcohol.  . If coughing or sneezing, cover  your mouth and nose by coughing or sneezing into the elbow areas of your shirt or coat, into a tissue or into your sleeve (not your hands). . Avoid shaking hands with others and consider head nods or verbal greetings only. . Avoid touching your eyes, nose, or mouth with unwashed hands.  . Avoid close contact with people who are sick. . Avoid places or events with large numbers of people in one location, like concerts or sporting events. . Carefully consider travel plans you have or are making. . If you are planning any travel outside or inside the [place], visit the CDC's Travelers' Health webpage for the latest health notices. . If you have some symptoms but not all symptoms, continue to monitor at home and seek medical attention if your symptoms worsen. . If you are having a medical emergency, call 911.   No Name / e-Visit: [email or web address]         [facility]: [facility]: [phone]                   [facility]: [phone]           It is flu season:   >>> Best ways to protect herself from the flu: Receive the yearly flu vaccine, practice good hand hygiene washing with soap and also using hand sanitizer when available, eat a nutritious meals, get adequate rest, hydrate appropriately   Please contact the office if your symptoms worsen or you have concerns that you are not improving.   Thank you for choosing [facility] for your healthcare, and for allowing [place] to partner with you on your healthcare journey. I am thankful to be able to provide care to you today.   Wyn Quaker FNP-C

## 2018-12-16 NOTE — Assessment & Plan Note (Addendum)
Assessment: Hyperinflation and larger lung volumes on 2011 pulmonary function test Mid flow reversibility as well as FEV1 reversibility on 2011 pulmonary function test Supposed to be managed on Flovent 110, patient has not been taking this correctly and has been using her Ventolin as her maintenance inhaler twice daily The Ventolin that she has been using as her maintenance inhaler expired in 2018  Plan: Patient does have worsening shortness of breath this is likely due to the fact that the patient is having flares of allergies as well as her inability to use maintenance inhalers correctly  Stop Flovent Trial of Symbicort 80 today Contact our office in 1 week and let us know how you are doing on Symbicort 80 Follow-up with our office in 4 weeks Throw out Ventolin expired inhaler that you have in office today Contact our office when you get home and let us know that the rescue inhaler that you have at home is not expired Contact our office when you go home and let us know what allergy medication you are using Follow-up with our office in 4 weeks or sooner We will order pulmonary function testing but due to COVID-19 restrictions we may not be able to get this completed until about 8 to 12 weeks from now will need COVID testing prior May need to consider referral to [facility] case management to help with asthma management as well as inhalers if patient cannot afford Symbicort 80 inhaler

## 2018-12-16 NOTE — Assessment & Plan Note (Signed)
Plan: Continue Zyrtec daily We can reevaluate in 4 weeks

## 2018-12-16 NOTE — Assessment & Plan Note (Signed)
Assessment: 2011 per [facility] patient's AHI was 37, she is managed on CPAP at set pressure of 11 Patient stopped using CPAP around 2015 Patient is interested in resuming CPAP therapy Epworth score today is 13  Plan: We will order home sleep study

## 2018-12-20 ENCOUNTER — Other Ambulatory Visit: Payer: Self-pay | Admitting: Surgery

## 2018-12-20 ENCOUNTER — Telehealth: Payer: Self-pay | Admitting: Pulmonary Disease

## 2018-12-20 DIAGNOSIS — E041 Nontoxic single thyroid nodule: Secondary | ICD-10-CM

## 2018-12-20 MED ORDER — MONTELUKAST SODIUM 10 MG PO TABS: 10.0000 mg | ORAL_TABLET | Freq: Every day | ORAL | 6 refills | Status: DC

## 2018-12-20 MED ORDER — BUDESONIDE-FORMOTEROL FUMARATE 80-4.5 MCG/ACT IN AERO: 2.0000 | INHALATION_SPRAY | Freq: Two times a day (BID) | RESPIRATORY_TRACT | 3 refills | Status: AC

## 2018-12-20 NOTE — Telephone Encounter (Signed)
Both meds refilled Pt aware to keep scheduled f/u Nothing further needed.

## 2018-12-20 NOTE — Telephone Encounter (Signed)
Please place an order for patient to have Symbicort 80 inhaler  Continue Symbicort 80 >>> 2 puffs in the morning right when you wake up, rinse out your mouth after use, 12 hours later 2 puffs, rinse after use >>> Take this daily, no matter what >>> This is not a rescue inhaler   6 refills   Can refill Singulair 10 mg with 6 refills  Patient is to keep scheduled follow-up with our office.  Wyn Quaker, FNP

## 2018-12-20 NOTE — Telephone Encounter (Signed)
Pt states Symbicort 80 is working great  She needs rx sent to [facility]. Pt was to call back with names of rescue inhaler and allergy medication she is using.  Rescue inhale on hand: Pt has Ventolin and ProAir. Allergy medication: Singulair 10 mg needs Refill

## 2018-12-23 ENCOUNTER — Ambulatory Visit
Admission: RE | Admit: 2018-12-23 | Discharge: 2018-12-23 | Disposition: A | Discharge status: Home / Self Care | Payer: Medicare HMO | Source: Ambulatory Visit | Attending: Surgery | Admitting: Surgery

## 2018-12-23 ENCOUNTER — Other Ambulatory Visit (HOSPITAL_COMMUNITY)
Admission: RE | Admit: 2018-12-23 | Discharge: 2018-12-23 | Disposition: A | Discharge status: Home / Self Care | Payer: Medicare HMO | Source: Ambulatory Visit | Attending: Radiology | Admitting: Radiology

## 2018-12-23 DIAGNOSIS — E041 Nontoxic single thyroid nodule: Secondary | ICD-10-CM | POA: Insufficient documentation

## 2019-01-02 DIAGNOSIS — Z888 Allergy status to other drugs, medicaments and biological substances status: Secondary | ICD-10-CM | POA: Diagnosis not present

## 2019-01-02 DIAGNOSIS — Z887 Allergy status to serum and vaccine status: Secondary | ICD-10-CM | POA: Diagnosis not present

## 2019-01-02 DIAGNOSIS — I10 Essential (primary) hypertension: Secondary | ICD-10-CM | POA: Diagnosis not present

## 2019-01-02 DIAGNOSIS — E785 Hyperlipidemia, unspecified: Secondary | ICD-10-CM | POA: Diagnosis not present

## 2019-01-02 DIAGNOSIS — L989 Disorder of the skin and subcutaneous tissue, unspecified: Secondary | ICD-10-CM | POA: Diagnosis not present

## 2019-01-02 DIAGNOSIS — N644 Mastodynia: Secondary | ICD-10-CM | POA: Diagnosis not present

## 2019-01-02 DIAGNOSIS — Z79899 Other long term (current) drug therapy: Secondary | ICD-10-CM | POA: Diagnosis not present

## 2019-01-02 DIAGNOSIS — E119 Type 2 diabetes mellitus without complications: Secondary | ICD-10-CM | POA: Diagnosis not present

## 2019-01-04 ENCOUNTER — Encounter: Payer: Self-pay | Admitting: Family

## 2019-01-04 ENCOUNTER — Ambulatory Visit (INDEPENDENT_AMBULATORY_CARE_PROVIDER_SITE_OTHER): Payer: Medicare HMO | Admitting: Family

## 2019-01-04 ENCOUNTER — Other Ambulatory Visit: Payer: Self-pay

## 2019-01-04 VITALS — BP 124/78 | HR 60 | Temp 98.4°F | Resp 16 | Ht 62.5 in | Wt 155.0 lb

## 2019-01-04 DIAGNOSIS — N649 Disorder of breast, unspecified: Secondary | ICD-10-CM

## 2019-01-04 MED ORDER — CEPHALEXIN 500 MG PO CAPS: 500.0000 mg | ORAL_CAPSULE | Freq: Three times a day (TID) | ORAL | 0 refills | Status: DC

## 2019-01-04 MED FILL — CEPHALEXIN 500 MG CAPSULE: 500 | 7 days supply | Qty: 21 | Fill #0

## 2019-01-04 NOTE — Progress Notes (Signed)
Subjective:    Patient ID: [name], female    DOB: Apr 03, 1951, 68 y.o.   MRN: [id number]  HPI  Patient is a 68 yr old female who presents today for hospital follow up.   Skin lesion- pt went to the ER on 12/23/18 due to lesion on her left breast. She was advised to follow up with the breast center. No oral antibiotics were prescribed. She has been applying gauze but notes that recently she developed some green drainage. Reports associated tenderness.    Review of Systems See HPI  Past Medical History:  Diagnosis Date   Acute peptic ulcer of stomach    As a teenager   Allergy    dust, cock roach,grass,weeds,molds   Anxiety    Arthritis    knees, hands, shoulders   Asthma    02/08/10 FEV1 1.98 (80%), s/p saba 2.22 l/m (90%).nml   Broken ankle    right foot, wearing a boot   Cognitive change 06/10/2016   Dementia ([place])    Depression    Diabetes mellitus 2003   Type II   Diverticulosis    Dyspepsia    Fatty liver 12/19/2010   GERD (gastroesophageal reflux disease)    Hearing loss    bilateral hearing aids    Herpes simplex virus (HSV) infection    High cholesterol    Hyperlipemia    Hypertension    Hypothyroidism 09/08/2013   IBS (irritable bowel syndrome)    Insomnia    Internal hemorrhoids    Joint pain 08/08/2016   Low back pain 08/10/2016   OSA (obstructive sleep apnea)    Does not use CPAP - old one broken, new sleep study to be scheduled and then given a new CPAP machine   Plantar fasciitis    no longer an issue   Sun-damaged skin 12/09/2016   SVD (spontaneous vaginal delivery)    x 4   Vaginal Pap smear, abnormal    Vitamin D deficiency 03/04/2016   Wears partial dentures    upper dentures and lower partial     Social History   Socioeconomic History   Marital status: Widowed    Spouse name: Not on file   Number of children: 4   Years of education: Not on file   Highest education level: Not on file    Occupational History   Occupation: retired    Fish farm manager: [employer]  Social Designer, fashion/clothing strain: Not on file   Food insecurity    Worry: Not on file    Inability: Not on file   Transportation needs    Medical: Not on file    Non-medical: Not on file  Tobacco Use   Smoking status: Never Smoker   Smokeless tobacco: Never Used  Substance and Sexual Activity   Alcohol use: Yes    Alcohol/week: 0.0 standard drinks    Comment: Rare occasions    Drug use: No   Sexual activity: Not Currently    Birth control/protection: Post-menopausal  Lifestyle   Physical activity    Days per week: Not on file    Minutes per session: Not on file   Stress: Not on file  Relationships   Social connections    Talks on phone: Not on file    Gets together: Not on file    Attends religious service: Not on file    Active member of club or organization: Not on file    Attends meetings of clubs or organizations:  Not on file    Relationship status: Not on file   Intimate partner violence    Fear of current or ex partner: Not on file    Emotionally abused: Not on file    Physically abused: Not on file    Forced sexual activity: Not on file  Other Topics Concern   Not on file  Social History Narrative   No caffeine drinks daily     Past Surgical History:  Procedure Laterality Date   APPENDECTOMY     age 80   CATARACT EXTRACTION Bilateral 2016   Dr. Bing Plume   CATARACT EXTRACTION, BILATERAL     12/8 and 12/14   COLONOSCOPY  06/03/2010, 08/25/2011    severeal - polyps and diverticulosis    Jay N/A 04/01/2018   Procedure: [place] POLYPECTOMY;  Surgeon: Emily Filbert, MD;  Location: [facility];  Service: Gynecology;  Laterality: N/A;   EYE SURGERY     cateracts removed bilateral   GASTRECTOMY     partial - ulcer as a teen   HAND SURGERY Right    thumb    HEMORRHOID BANDING     KNEE  ARTHROSCOPY Left 1998   NASAL SEPTUM SURGERY     RHINOPLASTY     TOE SURGERY Right 1995    Family History  Problem Relation Age of Onset   Diabetes Mother        type II   Heart attack Mother 77   Stroke Mother    Hyperlipidemia Mother    Colon cancer Sister    Cancer Sister        GI   Heart disease Sister    Asthma Maternal Grandmother    Mental illness Son        PTSD from [place]   Parkinsonism Father    Cancer Sister        stage 4 bladder cancer   Colon polyps Sister    Ulcers Other        bleeding gastric   Heart defect Other        child   Other Other        perforated bowel, child   Cancer Brother        pancreatic cancer   Amblyopia Neg Hx    Blindness Neg Hx    Cataracts Neg Hx    Glaucoma Neg Hx    Macular degeneration Neg Hx    Retinal detachment Neg Hx    Strabismus Neg Hx    Retinitis pigmentosa Neg Hx     Allergies  Allergen Reactions   Compazine [Prochlorperazine Edisylate]     Comma for 3 days   Sulfa Antibiotics Itching    severe itching   Tetanus Toxoid Other (See Comments)    convulsions    Current Outpatient Medications on File Prior to Visit  Medication Sig Dispense Refill   albuterol (PROVENTIL HFA;VENTOLIN HFA) 108 (90 Base) MCG/ACT inhaler Inhale 2 puffs into the lungs every 6 (six) hours as needed for wheezing or shortness of breath. 1 Inhaler 2   albuterol (VENTOLIN HFA) 108 (90 Base) MCG/ACT inhaler INHALE 2 PUFFS BY MOUTH INTO THE LUNGS EVERY 4 (FOUR) HOURS AS NEEDED FOR SHORTNESS OF BREATH AND WHEEZING 18 g 1   Alcohol Swabs (B-D SINGLE USE SWABS REGULAR) PADS Use as directed to check blood sugar. DX E11.9 100 each 3   atenolol (TENORMIN) 25 MG tablet TAKE 1 TABLET EVERY DAY 90  tablet 3   atorvastatin (LIPITOR) 80 MG tablet Take 1 tablet (80 mg total) by mouth daily. 90 tablet 1   budesonide-formoterol (SYMBICORT) 80-4.5 MCG/ACT inhaler Inhale 2 puffs into the lungs 2 (two) times a day. 1  Inhaler 3   cetirizine (ZYRTEC) 10 MG tablet Take 1 tablet (10 mg total) by mouth daily as needed for allergies. 30 tablet 11   Cholecalciferol (VITAMIN D3) 2000 units TABS Take 1 tablet by mouth daily.     diclofenac (VOLTAREN) 75 MG EC tablet Take 1 tablet (75 mg total) by mouth 2 (two) times daily. 60 tablet 1   dicyclomine (BENTYL) 10 MG capsule TAKE 1 CAPSULE BY MOUTH EVERY 4 TO 6 HOURS AS NEEDED FOR ABDOMINAL PAIN AND CRAMPING OR DIARRHEA 60 capsule 1   diphenoxylate-atropine (LOMOTIL) 2.5-0.025 MG tablet Take 1 tablet by mouth every morning. If continued diarrhea in the next 2-3 days may increase to twice daily. 30 tablet 3   donepezil (ARICEPT) 10 MG tablet Take 1 tablet (10 mg total) by mouth at bedtime. 30 tablet 0   escitalopram (LEXAPRO) 20 MG tablet Take 1 tablet (20 mg total) by mouth daily. 30 tablet 0   fenofibrate 160 MG tablet TAKE 1 TABLET (160 MG TOTAL) BY MOUTH DAILY. 90 tablet 2   fluticasone (FLOVENT HFA) 110 MCG/ACT inhaler Inhale 1-2 puffs into the lungs 2 (two) times daily. 1 Inhaler 3   gabapentin (NEURONTIN) 100 MG capsule Take 1 capsule (100 mg total) by mouth 3 (three) times daily. 90 capsule 3   glimepiride (AMARYL) 2 MG tablet Take 2 tablets (4 mg total) by mouth 2 (two) times daily. 90 tablet 3   glucose blood (ONETOUCH VERIO) test strip Use as instructed to check blood sugar 2 times a day 150 each 12   hydrochlorothiazide (HYDRODIURIL) 25 MG tablet Take 1 tablet (25 mg total) by mouth daily. 30 tablet 3   ketorolac (ACULAR) 0.5 % ophthalmic solution Place 1 drop into the right eye 4 (four) times daily. (Patient taking differently: Place 1 drop into both eyes 4 (four) times daily. ) 10 mL 0   Lancets Ultra Fine MISC Use to check blood sugar two times a day 200 each 12   levothyroxine (SYNTHROID, LEVOTHROID) 25 MCG tablet TAKE 1 TABLET DAILY BEFORE BREAKFAST. 90 tablet 0   losartan (COZAAR) 50 MG tablet TAKE 1 TABLET EVERY DAY 90 tablet 0    montelukast (SINGULAIR) 10 MG tablet TAKE 1 TABLET (10 MG TOTAL) BY MOUTH AT BEDTIME AS NEEDED (ALLERGIES). 30 tablet 3   montelukast (SINGULAIR) 10 MG tablet Take 1 tablet (10 mg total) by mouth at bedtime. 30 tablet 6   Multiple Vitamins-Minerals (CENTRUM SILVER PO) Take 1 tablet by mouth daily.       pantoprazole (PROTONIX) 40 MG tablet TAKE 1 TABLET EVERY DAY 90 tablet 0   prednisoLONE acetate (PRED FORTE) 1 % ophthalmic suspension Place 1 drop into the right eye 4 (four) times daily. 10 mL 0   Probiotic Product (ALIGN) 4 MG CAPS Take 1 capsule by mouth daily. Reported on 07/02/2015     sitaGLIPtin (JANUVIA) 100 MG tablet Take 1 tablet (100 mg total) by mouth daily. 90 tablet 3   triamcinolone cream (KENALOG) 0.1 % Apply 1 application topically 2 (two) times daily.   0   valACYclovir (VALTREX) 1000 MG tablet TAKE 1 TABLET (1,000 MG TOTAL) BY MOUTH 2 TIMES DAILY. 60 tablet 5   No current facility-administered medications on file prior to  visit.     BP 124/78 (BP Location: Left Arm, Patient Position: Sitting, Cuff Size: Small)    Pulse 60    Temp 98.4 F (36.9 C) (Oral)    Resp 16    Ht 5' 2.5" (1.588 m)    Wt 155 lb (70.3 kg)    LMP  (LMP Unknown)    SpO2 99%    BMI 27.90 kg/m       Objective:   Physical Exam Constitutional:      Appearance: Normal appearance.  Skin:    General: Skin is warm and dry.     Comments: Shallow ulcerated lesion left breast with surrounding erythema. No fluctuance  Neurological:     Mental Status: She is alert.    Breast: no discrete mass is noted in the left breast.          Assessment & Plan:  Breast lesion- trial of keflex for possible cellulitis.  Refer to the breast center for diagnostic mammo/US. If no improvement and breast imaging negative will need referral to dermatology for skin biopsy. Follow up in office in 1 week.

## 2019-01-04 NOTE — Patient Instructions (Addendum)
Please begin keflex for skin infection in your breast. You will be contacted about your referral for breast imaging at the breast center.  Call if increased pain/redness/swelling.

## 2019-01-06 ENCOUNTER — Other Ambulatory Visit: Payer: Self-pay

## 2019-01-06 ENCOUNTER — Ambulatory Visit: Payer: Medicare HMO

## 2019-01-06 DIAGNOSIS — G4733 Obstructive sleep apnea (adult) (pediatric): Secondary | ICD-10-CM

## 2019-01-11 ENCOUNTER — Other Ambulatory Visit: Payer: Self-pay

## 2019-01-11 ENCOUNTER — Ambulatory Visit (INDEPENDENT_AMBULATORY_CARE_PROVIDER_SITE_OTHER): Payer: Medicare HMO | Admitting: Family

## 2019-01-11 ENCOUNTER — Encounter: Payer: Self-pay | Admitting: Family

## 2019-01-11 VITALS — BP 159/71 | HR 66 | Temp 98.0°F | Resp 16 | Ht 62.5 in | Wt 156.0 lb

## 2019-01-11 DIAGNOSIS — N61 Mastitis without abscess: Secondary | ICD-10-CM

## 2019-01-11 MED ORDER — DOXYCYCLINE HYCLATE 100 MG PO TABS: 100.0000 mg | ORAL_TABLET | Freq: Two times a day (BID) | ORAL | 0 refills | Status: DC

## 2019-01-11 MED FILL — DOXYCYCLINE HYCLATE 100 MG: 100 | 10 days supply | Qty: 20 | Fill #0

## 2019-01-11 NOTE — Patient Instructions (Signed)
Please begin doxycycline. Keep your appointment tomorrow for mammogram/ultrasound.

## 2019-01-11 NOTE — Progress Notes (Addendum)
Subjective:    Patient ID: Crystal [name], female    DOB: 1951-04-08, 68 y.o.   MRN: [id number]  HPI  Patient is a 68 year old female who presents today for follow-up of her left-sided breast lesion.  We saw her on January 04, 2019 and placed her on Keflex.  A diagnostic mammogram and ultrasound was also ordered.  She has completed the 7-day course of Keflex and notes that the breast has become increasingly red and warm feeling.  She reports some mild drainage.  She is scheduled for her breast imaging tomorrow.  Review of Systems  See HPI  Past Medical History:  Diagnosis Date  . Acute peptic ulcer of stomach    As a teenager  . Allergy    dust, cock roach,grass,weeds,molds  . Anxiety   . Arthritis    knees, hands, shoulders  . Asthma    02/08/10 FEV1 1.98 (80%), s/p saba 2.22 l/m (90%).nml  . Broken ankle    right foot, wearing a boot  . Cognitive change 06/10/2016  . Dementia (Many)   . Depression   . Diabetes mellitus 2003   Type II  . Diverticulosis   . Dyspepsia   . Fatty liver 12/19/2010  . GERD (gastroesophageal reflux disease)   . Hearing loss    bilateral hearing aids   . Herpes simplex virus (HSV) infection   . High cholesterol   . Hyperlipemia   . Hypertension   . Hypothyroidism 09/08/2013  . IBS (irritable bowel syndrome)   . Insomnia   . Internal hemorrhoids   . Joint pain 08/08/2016  . Low back pain 08/10/2016  . OSA (obstructive sleep apnea)    Does not use CPAP - old one broken, new sleep study to be scheduled and then given a new CPAP machine  . Plantar fasciitis    no longer an issue  . Sun-damaged skin 12/09/2016  . SVD (spontaneous vaginal delivery)    x 4  . Vaginal Pap smear, abnormal   . Vitamin D deficiency 03/04/2016  . Wears partial dentures    upper dentures and lower partial     Social History   Socioeconomic History  . Marital status: Widowed    Spouse name: Not on file  . Number of children: 4  . Years of education: Not on  file  . Highest education level: Not on file  Occupational History  . Occupation: retired    Fish farm manager: [employer]  Social Needs  . Financial resource strain: Not on file  . Food insecurity    Worry: Not on file    Inability: Not on file  . Transportation needs    Medical: Not on file    Non-medical: Not on file  Tobacco Use  . Smoking status: Never Smoker  . Smokeless tobacco: Never Used  Substance and Sexual Activity  . Alcohol use: Yes    Alcohol/week: 0.0 standard drinks    Comment: Rare occasions   . Drug use: No  . Sexual activity: Not Currently    Birth control/protection: Post-menopausal  Lifestyle  . Physical activity    Days per week: Not on file    Minutes per session: Not on file  . Stress: Not on file  Relationships  . Social Herbalist on phone: Not on file    Gets together: Not on file    Attends religious service: Not on file    Active member of club or organization: Not on file  Attends meetings of clubs or organizations: Not on file    Relationship status: Not on file  . Intimate partner violence    Fear of current or ex partner: Not on file    Emotionally abused: Not on file    Physically abused: Not on file    Forced sexual activity: Not on file  Other Topics Concern  . Not on file  Social History Narrative   No caffeine drinks daily     Past Surgical History:  Procedure Laterality Date  . APPENDECTOMY     age 70  . CATARACT EXTRACTION Bilateral 2016   Dr. Bing Plume  . CATARACT EXTRACTION, BILATERAL     12/8 and 12/14  . COLONOSCOPY  06/03/2010, 08/25/2011    severeal - polyps and diverticulosis   . DILATATION & CURETTAGE/HYSTEROSCOPY WITH MYOSURE N/A 04/01/2018   Procedure: DILATATION & CURETTAGE/HYSTEROSCOPY WITH MYOSURE POLYPECTOMY;  Surgeon: Emily Filbert, MD;  Location: [facility];  Service: Gynecology;  Laterality: N/A;  . EYE SURGERY     cateracts removed bilateral  . GASTRECTOMY     partial - ulcer as a teen  . HAND SURGERY Right     thumb   . HEMORRHOID BANDING    . KNEE ARTHROSCOPY Left 1998  . NASAL SEPTUM SURGERY    . RHINOPLASTY    . TOE SURGERY Right 1995    Family History  Problem Relation Age of Onset  . Diabetes Mother        type II  . Heart attack Mother 72  . Stroke Mother   . Hyperlipidemia Mother   . Colon cancer Sister   . Cancer Sister        GI  . Heart disease Sister   . Asthma Maternal Grandmother   . Mental illness Son        PTSD from [employer]  . Parkinsonism Father   . Cancer Sister        stage 4 bladder cancer  . Colon polyps Sister   . Ulcers Other        bleeding gastric  . Heart defect Other        child  . Other Other        perforated bowel, child  . Cancer Brother        pancreatic cancer  . Amblyopia Neg Hx   . Blindness Neg Hx   . Cataracts Neg Hx   . Glaucoma Neg Hx   . Macular degeneration Neg Hx   . Retinal detachment Neg Hx   . Strabismus Neg Hx   . Retinitis pigmentosa Neg Hx     Allergies  Allergen Reactions  . Compazine [Prochlorperazine Edisylate]     Comma for 3 days  . Sulfa Antibiotics Itching    severe itching  . Tetanus Toxoid Other (See Comments)    convulsions    Current Outpatient Medications on File Prior to Visit  Medication Sig Dispense Refill  . albuterol (PROVENTIL HFA;VENTOLIN HFA) 108 (90 Base) MCG/ACT inhaler Inhale 2 puffs into the lungs every 6 (six) hours as needed for wheezing or shortness of breath. 1 Inhaler 2  . albuterol (VENTOLIN HFA) 108 (90 Base) MCG/ACT inhaler INHALE 2 PUFFS BY MOUTH INTO THE LUNGS EVERY 4 (FOUR) HOURS AS NEEDED FOR SHORTNESS OF BREATH AND WHEEZING 18 g 1  . Alcohol Swabs (B-D SINGLE USE SWABS REGULAR) PADS Use as directed to check blood sugar. DX E11.9 100 each 3  . atenolol (TENORMIN) 25 MG tablet  TAKE 1 TABLET EVERY DAY 90 tablet 3  . atorvastatin (LIPITOR) 80 MG tablet Take 1 tablet (80 mg total) by mouth daily. 90 tablet 1  . budesonide-formoterol (SYMBICORT) 80-4.5 MCG/ACT inhaler Inhale 2  puffs into the lungs 2 (two) times a day. 1 Inhaler 3  . cephALEXin (KEFLEX) 500 MG capsule Take 1 capsule (500 mg total) by mouth 3 (three) times daily. 21 capsule 0  . cetirizine (ZYRTEC) 10 MG tablet Take 1 tablet (10 mg total) by mouth daily as needed for allergies. 30 tablet 11  . Cholecalciferol (VITAMIN D3) 2000 units TABS Take 1 tablet by mouth daily.    . diclofenac (VOLTAREN) 75 MG EC tablet Take 1 tablet (75 mg total) by mouth 2 (two) times daily. 60 tablet 1  . dicyclomine (BENTYL) 10 MG capsule TAKE 1 CAPSULE BY MOUTH EVERY 4 TO 6 HOURS AS NEEDED FOR ABDOMINAL PAIN AND CRAMPING OR DIARRHEA 60 capsule 1  . diphenoxylate-atropine (LOMOTIL) 2.5-0.025 MG tablet Take 1 tablet by mouth every morning. If continued diarrhea in the next 2-3 days may increase to twice daily. 30 tablet 3  . donepezil (ARICEPT) 10 MG tablet Take 1 tablet (10 mg total) by mouth at bedtime. 30 tablet 0  . escitalopram (LEXAPRO) 20 MG tablet Take 1 tablet (20 mg total) by mouth daily. 30 tablet 0  . fenofibrate 160 MG tablet TAKE 1 TABLET (160 MG TOTAL) BY MOUTH DAILY. 90 tablet 2  . fluticasone (FLOVENT HFA) 110 MCG/ACT inhaler Inhale 1-2 puffs into the lungs 2 (two) times daily. 1 Inhaler 3  . gabapentin (NEURONTIN) 100 MG capsule Take 1 capsule (100 mg total) by mouth 3 (three) times daily. 90 capsule 3  . glimepiride (AMARYL) 2 MG tablet Take 2 tablets (4 mg total) by mouth 2 (two) times daily. 90 tablet 3  . glucose blood (ONETOUCH VERIO) test strip Use as instructed to check blood sugar 2 times a day 150 each 12  . hydrochlorothiazide (HYDRODIURIL) 25 MG tablet Take 1 tablet (25 mg total) by mouth daily. 30 tablet 3  . ketorolac (ACULAR) 0.5 % ophthalmic solution Place 1 drop into the right eye 4 (four) times daily. (Patient taking differently: Place 1 drop into both eyes 4 (four) times daily. ) 10 mL 0  . Lancets Ultra Fine MISC Use to check blood sugar two times a day 200 each 12  . levothyroxine (SYNTHROID,  LEVOTHROID) 25 MCG tablet TAKE 1 TABLET DAILY BEFORE BREAKFAST. 90 tablet 0  . losartan (COZAAR) 50 MG tablet TAKE 1 TABLET EVERY DAY 90 tablet 0  . montelukast (SINGULAIR) 10 MG tablet TAKE 1 TABLET (10 MG TOTAL) BY MOUTH AT BEDTIME AS NEEDED (ALLERGIES). 30 tablet 3  . montelukast (SINGULAIR) 10 MG tablet Take 1 tablet (10 mg total) by mouth at bedtime. 30 tablet 6  . Multiple Vitamins-Minerals (CENTRUM SILVER PO) Take 1 tablet by mouth daily.      . pantoprazole (PROTONIX) 40 MG tablet TAKE 1 TABLET EVERY DAY 90 tablet 0  . prednisoLONE acetate (PRED FORTE) 1 % ophthalmic suspension Place 1 drop into the right eye 4 (four) times daily. 10 mL 0  . Probiotic Product (ALIGN) 4 MG CAPS Take 1 capsule by mouth daily. Reported on 07/02/2015    . sitaGLIPtin (JANUVIA) 100 MG tablet Take 1 tablet (100 mg total) by mouth daily. 90 tablet 3  . triamcinolone cream (KENALOG) 0.1 % Apply 1 application topically 2 (two) times daily.   0  . valACYclovir (  VALTREX) 1000 MG tablet TAKE 1 TABLET (1,000 MG TOTAL) BY MOUTH 2 TIMES DAILY. 60 tablet 5   No current facility-administered medications on file prior to visit.     BP (!) 159/71 (BP Location: Left Arm, Patient Position: Sitting, Cuff Size: Small)   Pulse 66   Temp 98 F (36.7 C) (Oral)   Resp 16   Ht 5' 2.5" (1.588 m)   Wt 156 lb (70.8 kg)   LMP  (LMP Unknown)   SpO2 97%   BMI 28.08 kg/m       Objective:   Physical Exam Constitutional:      Appearance: Normal appearance. She is not ill-appearing.  Cardiovascular:     Rate and Rhythm: Normal rate and regular rhythm.     Pulses: Normal pulses.     Heart sounds: Normal heart sounds.  Neurological:     Mental Status: She is alert and oriented to person, place, and time.   skin: increased erythema noted left breast. No induration or fluctuance. Blistered lesions noted          Assessment & Plan:  Cellulitis of breast-deteriorated.  Symptoms are most consistent with cellulitis of the  left breast.  She did not respond to Keflex.  We will plan treatment with doxycycline to cover for MRSA.  I have advised her to keep her upcoming appointment tomorrow with the breast center.  She is advised to follow-up in 1 week.  She is also advised to go to the emergency department if increased pain swelling or redness occurs while on doxycycline.  Patient verbalizes understanding.

## 2019-01-12 ENCOUNTER — Ambulatory Visit
Admission: RE | Admit: 2019-01-12 | Discharge: 2019-01-12 | Disposition: A | Discharge status: Home / Self Care | Payer: Medicare HMO | Source: Ambulatory Visit | Attending: Family | Admitting: Family

## 2019-01-12 ENCOUNTER — Telehealth: Payer: Self-pay | Admitting: Pulmonary Disease

## 2019-01-12 ENCOUNTER — Other Ambulatory Visit: Payer: Self-pay | Admitting: Family

## 2019-01-12 DIAGNOSIS — N649 Disorder of breast, unspecified: Secondary | ICD-10-CM

## 2019-01-12 DIAGNOSIS — R928 Other abnormal and inconclusive findings on diagnostic imaging of breast: Secondary | ICD-10-CM | POA: Diagnosis not present

## 2019-01-12 DIAGNOSIS — N6321 Unspecified lump in the left breast, upper outer quadrant: Secondary | ICD-10-CM | POA: Diagnosis not present

## 2019-01-12 NOTE — Telephone Encounter (Signed)
Called and spoke with pt to see if the results she was asking about were the HST results and pt stated she was actually talking about the results from the biopsy she had performed on her thyroid. Stated to pt to call PCP to see if they are able to address this and pt verbalized understanding. Nothing further needed.

## 2019-01-13 ENCOUNTER — Telehealth: Payer: Self-pay

## 2019-01-13 NOTE — Telephone Encounter (Signed)
Copied from [place] [id number]. Topic: General - Other >> Jan 12, 2019  5:11 PM Marin Olp L wrote: Reason for CRM: Patient would like a call back to find out if Dr. Jenna Luo reviewed her thyroid surgery results?    Dr.Blyth please advise and I will call patient

## 2019-01-13 NOTE — Telephone Encounter (Signed)
Yes I thought surgery let her know but let her know it is benign follicular findings no need for further intervention.

## 2019-01-17 ENCOUNTER — Other Ambulatory Visit: Payer: Self-pay

## 2019-01-17 ENCOUNTER — Emergency Department
Admission: EM | Admit: 2019-01-17 | Discharge: 2019-01-17 | Disposition: A | Discharge status: Home / Self Care | Payer: Medicare HMO | Source: Home / Self Care

## 2019-01-17 DIAGNOSIS — R21 Rash and other nonspecific skin eruption: Secondary | ICD-10-CM

## 2019-01-17 MED ORDER — VALACYCLOVIR HCL 1 G PO TABS: 1000.0000 mg | ORAL_TABLET | Freq: Three times a day (TID) | ORAL | 0 refills | Status: DC

## 2019-01-17 NOTE — ED Triage Notes (Signed)
Rash upper Back/neck are. Red and welted

## 2019-01-17 NOTE — ED Provider Notes (Signed)
[name]    CSN: [id number] Arrival date & time: 01/17/19  1622     History   Chief Complaint Chief Complaint  Patient presents with  . Rash    HPI Crystal [name] is a 68 y.o. female.   HPI Crystal [name] is a 68 y.o. female presenting to UC with c/o 2 days of worsening painful rash on the left side of her upper back/lower neck.  Pain is 10/10 even with just the slightest touch of her clothing on her skin.  She has been on doxycycline the last 6 days to treat a skin infection on her breast. That infection has nearly resolved. She has f/u with her PCP tomorrow and with the breast center on Wednesday.  She does report having a similar painful rash under her Right breast a few months ago but it resolved on its own. Pt does not recall how long that rash lasted.  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 Denies fever, chills, n/v/d. Denies any other rashes at this time. Denies oral swelling or trouble breathing. Pt is allergic to sulfa but no other antibiotic allergies.   BP elevated, hx of same. Pt believes higher today due to the pain.  Past Medical History:  Diagnosis Date  . Acute peptic ulcer of stomach    As a teenager  . Allergy    dust, cock roach,grass,weeds,molds  . Anxiety   . Arthritis    knees, hands, shoulders  . Asthma    02/08/10 FEV1 1.98 (80%), s/p saba 2.22 l/m (90%).nml  . Broken ankle    right foot, wearing a boot  . Cognitive change 06/10/2016  . Dementia ([place])   . Depression   . Diabetes mellitus 2003   Type II  . Diverticulosis   . Dyspepsia   . Fatty liver 12/19/2010  . GERD (gastroesophageal reflux disease)   . Hearing loss    bilateral hearing aids   . Herpes simplex virus (HSV) infection   . High cholesterol   . Hyperlipemia   . Hypertension   . Hypothyroidism 09/08/2013  . IBS (irritable bowel syndrome)   . Insomnia   . Internal hemorrhoids   . Joint pain 08/08/2016  . Low back pain 08/10/2016  . OSA  (obstructive sleep apnea)    Does not use CPAP - old one broken, new sleep study to be scheduled and then given a new CPAP machine  . Plantar fasciitis    no longer an issue  . Sun-damaged skin 12/09/2016  . SVD (spontaneous vaginal delivery)    x 4  . Vaginal Pap smear, abnormal   . Vitamin D deficiency 03/04/2016  . Wears partial dentures    upper dentures and lower partial    Patient Active Problem List   Diagnosis Date Noted  . Left thyroid nodule 12/06/2018  . Costochondral chest pain 06/01/2018  . Postmenopausal bleeding 03/20/2018  . Tremor 06/26/2017  . Left knee pain 06/18/2017  . Right knee pain 12/11/2016  . 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 Sun-damaged skin 12/09/2016  . 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 Mild cognitive impairment 08/21/2016  . Mild single current episode of major depressive disorder ([place]) 08/21/2016  . Low back pain 08/10/2016  . Joint pain 08/08/2016  . Prolapsed internal hemorrhoids, grade 2 06/18/2016  . Cognitive change 06/10/2016  . Family history of colon cancer 05/19/2016  . Vitamin D deficiency 03/04/2016  . Deformity of  metatarsal bone of right foot 08/15/2015  . Metatarsalgia of right foot 08/15/2015  . Otalgia 04/02/2015  . Preventative health care 06/25/2014  . Sinusitis 12/09/2013  . Osteopenia 09/19/2013  . Hypothyroidism 09/08/2013  . Osteoarthritis, knee 09/06/2013  . Skin lesion 09/01/2013  . Dysphagia, unspecified(787.20) 07/19/2013  . Allergic rhinitis 11/16/2012  . Osteoarthritis of both hands 10/15/2012  . Peripheral neuropathy 12/19/2011  . 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 GERD (gastroesophageal reflux disease) 03/07/2011  . Fatty liver 12/19/2010  . Genital herpes 08/06/2010  . MAMMOGRAM, ABNORMAL 08/02/2010  . ABNORMAL ELECTROCARDIOGRAM 07/19/2010  . Obstructive sleep apnea 06/18/2010  . DM (diabetes mellitus), type 2 with neurological complications ([place])  46/96/2952  . Hyperlipidemia 04/12/2010  . Essential hypertension 04/12/2010  . Asthma 04/12/2010  . DEPRESSION/ANXIETY 03/13/2010    Past Surgical History:  Procedure Laterality Date  . APPENDECTOMY     age 59  . CATARACT EXTRACTION Bilateral 2016   Dr. Bing Plume  . CATARACT EXTRACTION, BILATERAL     12/8 and 12/14  . COLONOSCOPY  06/03/2010, 08/25/2011    severeal - polyps and diverticulosis   . DILATATION & CURETTAGE/HYSTEROSCOPY WITH MYOSURE N/A 04/01/2018   Procedure: DILATATION & CURETTAGE/HYSTEROSCOPY WITH MYOSURE POLYPECTOMY;  Surgeon: Emily Filbert, MD;  Location: [facility];  Service: Gynecology;  Laterality: N/A;  . EYE SURGERY     cateracts removed bilateral  . GASTRECTOMY     partial - ulcer as a teen  . HAND SURGERY Right    thumb   . HEMORRHOID BANDING    . KNEE ARTHROSCOPY Left 1998  . NASAL SEPTUM SURGERY    . RHINOPLASTY    . TOE SURGERY Right 1995    OB History    Gravida  4   Para  4   Term      Preterm      AB      Living  3     SAB      TAB      Ectopic      Multiple      Live Births           Obstetric Comments  1 child died at 22 weeks (heart defects, perforated bowel)         Home Medications    Prior to Admission medications   Medication Sig Start Date End Date Taking? Authorizing Provider  albuterol (PROVENTIL HFA;VENTOLIN HFA) 108 (90 Base) MCG/ACT inhaler Inhale 2 puffs into the lungs every 6 (six) hours as needed for wheezing or shortness of breath. 10/27/18   Saguier, Percell Miller, PA-C  albuterol (VENTOLIN HFA) 108 (90 Base) MCG/ACT inhaler INHALE 2 PUFFS BY MOUTH INTO THE LUNGS EVERY 4 (FOUR) HOURS AS NEEDED FOR SHORTNESS OF BREATH AND WHEEZING 12/02/18   [name], MD  Alcohol Swabs (B-D SINGLE USE SWABS REGULAR) PADS Use as directed to check blood sugar. DX E11.9 07/15/17   [name], MD  atenolol (TENORMIN) 25 MG tablet TAKE 1 TABLET EVERY DAY 09/06/18   [name], MD  atorvastatin (LIPITOR) 80 MG tablet Take 1  tablet (80 mg total) by mouth daily. 12/16/18   [name], MD  budesonide-formoterol (SYMBICORT) 80-4.5 MCG/ACT inhaler Inhale 2 puffs into the lungs 2 (two) times a day. 12/20/18   Lauraine Rinne, NP  cephALEXin (KEFLEX) 500 MG capsule Take 1 capsule (500 mg total) by mouth 3 (three) times daily. 01/04/19   Debbrah Alar, NP  cetirizine Alethia Berthold)  10 MG tablet Take 1 tablet (10 mg total) by mouth daily as needed for allergies. 05/07/17   [name], MD  Cholecalciferol (VITAMIN D3) 2000 units TABS Take 1 tablet by mouth daily.    [provider]  diclofenac (VOLTAREN) 75 MG EC tablet Take 1 tablet (75 mg total) by mouth 2 (two) times daily. 09/27/18   Hudnall, Sharyn Lull, MD  dicyclomine (BENTYL) 10 MG capsule TAKE 1 CAPSULE BY MOUTH EVERY 4 TO 6 HOURS AS NEEDED FOR ABDOMINAL PAIN AND CRAMPING OR DIARRHEA 06/04/18   Levin Erp, PA  diphenoxylate-atropine (LOMOTIL) 2.5-0.025 MG tablet Take 1 tablet by mouth every morning. If continued diarrhea in the next 2-3 days may increase to twice daily. 06/22/18   Willia Craze, NP  donepezil (ARICEPT) 10 MG tablet Take 1 tablet (10 mg total) by mouth at bedtime. 02/24/18 02/24/19  Aundra Dubin, MD  doxycycline (VIBRA-TABS) 100 MG tablet Take 1 tablet (100 mg total) by mouth 2 (two) times daily. 01/11/19   Debbrah Alar, NP  escitalopram (LEXAPRO) 20 MG tablet Take 1 tablet (20 mg total) by mouth daily. 02/24/18   Aundra Dubin, MD  fenofibrate 160 MG tablet TAKE 1 TABLET (160 MG TOTAL) BY MOUTH DAILY. 09/06/18   [name], MD  fluticasone (FLOVENT HFA) 110 MCG/ACT inhaler Inhale 1-2 puffs into the lungs 2 (two) times daily. 12/02/18   [name], MD  gabapentin (NEURONTIN) 100 MG capsule Take 1 capsule (100 mg total) by mouth 3 (three) times daily. 09/02/18   Debbrah Alar, NP  glimepiride (AMARYL) 2 MG tablet Take 2 tablets (4 mg total) by mouth 2 (two) times daily. 10/27/18   Philemon Kingdom, MD   glucose blood (ONETOUCH VERIO) test strip Use as instructed to check blood sugar 2 times a day 10/27/18   Philemon Kingdom, MD  hydrochlorothiazide (HYDRODIURIL) 25 MG tablet Take 1 tablet (25 mg total) by mouth daily. 09/02/18   Debbrah Alar, NP  ketorolac (ACULAR) 0.5 % ophthalmic solution Place 1 drop into the right eye 4 (four) times daily. Patient taking differently: Place 1 drop into both eyes 4 (four) times daily.  05/24/18 05/24/19  Bernarda Caffey, MD  Lancets Ultra Fine MISC Use to check blood sugar two times a day 10/27/18   Philemon Kingdom, MD  levothyroxine (SYNTHROID, LEVOTHROID) 25 MCG tablet TAKE 1 TABLET DAILY BEFORE BREAKFAST. 09/06/18   [name], MD  losartan (COZAAR) 50 MG tablet TAKE 1 TABLET EVERY DAY 09/06/18   [name], MD  montelukast (SINGULAIR) 10 MG tablet TAKE 1 TABLET (10 MG TOTAL) BY MOUTH AT BEDTIME AS NEEDED (ALLERGIES). 10/27/18   Saguier, Percell Miller, PA-C  montelukast (SINGULAIR) 10 MG tablet Take 1 tablet (10 mg total) by mouth at bedtime. 12/20/18 12/20/19  Lauraine Rinne, NP  Multiple Vitamins-Minerals (CENTRUM SILVER PO) Take 1 tablet by mouth daily.      [provider]  pantoprazole (PROTONIX) 40 MG tablet TAKE 1 TABLET EVERY DAY 09/06/18   [name], MD  prednisoLONE acetate (PRED FORTE) 1 % ophthalmic suspension Place 1 drop into the right eye 4 (four) times daily. 05/24/18   Bernarda Caffey, MD  Probiotic Product (ALIGN) 4 MG CAPS Take 1 capsule by mouth daily. Reported on 07/02/2015    [provider]  sitaGLIPtin (JANUVIA) 100 MG tablet Take 1 tablet (100 mg total) by mouth daily. 10/27/18   Philemon Kingdom, MD  triamcinolone cream (KENALOG) 0.1 % Apply 1  application topically 2 (two) times daily.  03/18/18   [provider]  valACYclovir (VALTREX) 1000 MG tablet Take 1 tablet (1,000 mg total) by mouth 3 (three) times daily. 01/17/19   Noe Gens, PA-C    Family History Family History  Problem Relation Age of  Onset  . Diabetes Mother        type II  . Heart attack Mother 72  . Stroke Mother   . Hyperlipidemia Mother   . Colon cancer Sister   . Cancer Sister        GI  . Heart disease Sister   . Asthma Maternal Grandmother   . Mental illness Son        PTSD from [employer]  . Parkinsonism Father   . Cancer Sister        stage 4 bladder cancer  . Colon polyps Sister   . Ulcers Other        bleeding gastric  . Heart defect Other        child  . Other Other        perforated bowel, child  . Cancer Brother        pancreatic cancer  . Amblyopia Neg Hx   . Blindness Neg Hx   . Cataracts Neg Hx   . Glaucoma Neg Hx   . Macular degeneration Neg Hx   . Retinal detachment Neg Hx   . Strabismus Neg Hx   . Retinitis pigmentosa Neg Hx     Social History Social History   Tobacco Use  . Smoking status: Never Smoker  . Smokeless tobacco: Never Used  Substance Use Topics  . Alcohol use: Yes    Alcohol/week: 0.0 standard drinks    Comment: Rare occasions   . Drug use: No     Allergies   Compazine [prochlorperazine edisylate], Sulfa antibiotics, and Tetanus toxoid   Review of Systems Review of Systems  Constitutional: Negative for chills and fever.  HENT: Negative for sore throat.   Respiratory: Negative for shortness of breath and wheezing.   Gastrointestinal: Negative for diarrhea, nausea and vomiting.  Skin: Positive for rash. Negative for wound.     Physical Exam Triage Vital Signs ED Triage Vitals  Enc Vitals Group     BP 01/17/19 1635 (!) 183/94     Pulse Rate 01/17/19 1635 62     Resp 01/17/19 1635 20     Temp 01/17/19 1635 98 F (36.7 C)     Temp Source 01/17/19 1635 Oral     SpO2 01/17/19 1635 98 %     Weight 01/17/19 1637 157 lb (71.2 kg)     Height 01/17/19 1637 5\' 2"  (1.575 m)     Head Circumference --      Peak Flow --      Pain Score 01/17/19 1636 10     Pain Loc --      Pain Edu? --      Excl. in [place]? --    No data found.  Updated Vital Signs BP  (!) 183/94 (BP Location: Right Arm)   Pulse 62   Temp 98 F (36.7 C) (Oral)   Resp 20   Ht 5\' 2"  (1.575 m)   Wt 157 lb (71.2 kg)   LMP  (LMP Unknown)   SpO2 98%   BMI 28.72 kg/m   Visual Acuity Right Eye Distance:   Left Eye Distance:   Bilateral Distance:    Right Eye Near:   Left  Eye Near:    Bilateral Near:     Physical Exam Vitals signs and nursing note reviewed.  Constitutional:      Appearance: Normal appearance. She is well-developed.  HENT:     Head: Normocephalic and atraumatic.  Neck:     Musculoskeletal: Normal range of motion.  Cardiovascular:     Rate and Rhythm: Normal rate.  Pulmonary:     Effort: Pulmonary effort is normal.  Musculoskeletal: Normal range of motion.  Skin:    General: Skin is warm and dry.     Findings: Erythema and rash present.       Neurological:     Mental Status: She is alert and oriented to person, place, and time.  Psychiatric:        Behavior: Behavior normal.      UC Treatments / Results  Labs (all labs ordered are listed, but only abnormal results are displayed) Labs Reviewed - No data to display  EKG   Radiology No results found.  Procedures Procedures (including critical care time)  Medications Ordered in UC Medications - No data to display  Initial Impression / Assessment and Plan / UC Course  I have reviewed the triage vital signs and the nursing notes.  Pertinent labs & imaging results that were available during my care of the patient were reviewed by me and considered in my medical decision making (see chart for details).     Location of rash and severity of pain, concerning for shingles rash.  Reviewed medical records, cellulitis on Left breast appears to be healing well.  No other rashes. No evidence of systemic allergic reaction including no other rashes and no oral swelling or lesions. Will start pt on valtrex, pt has done well with this medication in the past. Pt has previously scheduled  f/u appointment tomorrow with her PCP. Encouraged to keep that appointment. AVS provided.  Final Clinical Impressions(s) / UC Diagnoses   Final diagnoses:  Rash and nonspecific skin eruption     Discharge Instructions      Keep rash clean with warm water and mild soap. Pay apply over the counter calamine lotion to help sooth the rash. Please follow up with your family doctor this week for recheck of symptoms.     ED Prescriptions    Medication Sig Dispense Auth. Provider   valACYclovir (VALTREX) 1000 MG tablet Take 1 tablet (1,000 mg total) by mouth 3 (three) times daily. 21 tablet Noe Gens, PA-C     Controlled Substance Prescriptions [place] Controlled Substance Registry consulted? Not Applicable   Tyrell Antonio 01/17/19 1712

## 2019-01-17 NOTE — Discharge Instructions (Addendum)
°  Keep rash clean with warm water and mild soap. Pay apply over the counter calamine lotion to help sooth the rash. Please follow up with your family doctor this week for recheck of symptoms.

## 2019-01-17 NOTE — Telephone Encounter (Signed)
Called patient left voicemail for patient to call the office back.]\  Nurse triage may handle  

## 2019-01-18 ENCOUNTER — Ambulatory Visit (INDEPENDENT_AMBULATORY_CARE_PROVIDER_SITE_OTHER): Payer: Medicare HMO | Admitting: Family

## 2019-01-18 ENCOUNTER — Encounter: Payer: Self-pay | Admitting: Family

## 2019-01-18 VITALS — BP 156/80 | HR 74 | Temp 98.2°F | Resp 16 | Ht 62.5 in | Wt 157.6 lb

## 2019-01-18 DIAGNOSIS — N61 Mastitis without abscess: Secondary | ICD-10-CM | POA: Diagnosis not present

## 2019-01-18 DIAGNOSIS — B029 Zoster without complications: Secondary | ICD-10-CM | POA: Diagnosis not present

## 2019-01-18 DIAGNOSIS — G4733 Obstructive sleep apnea (adult) (pediatric): Secondary | ICD-10-CM | POA: Diagnosis not present

## 2019-01-18 NOTE — Patient Instructions (Signed)
Please keep your upcoming appointment at the breast center. Complete valtrex 3x daily for 1 week. Call if rash worsens or if it fails to improve in 1 week.

## 2019-01-18 NOTE — Progress Notes (Signed)
Subjective:    Patient ID: Crystal [name], female    DOB: [birth year], 68 y.o.   MRN: [id number]  HPI  Patient is a 68 year old female who presents today for follow-up of her left breast lesion.  She has completed a one-week course of doxycycline.  She was seen in the breast center last week and underwent an ultrasound.  She was advised to follow-up with them in 1 week which is scheduled for tomorrow.  She notes significant improvement in the redness.  She was seen yesterday in the emergency department due to a new painful rash which has surfaced on her left upper posterior shoulder. She is currently on valtrex.    Review of Systems  See HPI  Past Medical History:  Diagnosis Date  . Acute peptic ulcer of stomach    As a teenager  . Allergy    dust, cock roach,grass,weeds,molds  . Anxiety   . Arthritis    knees, hands, shoulders  . Asthma    02/08/10 FEV1 1.98 (80%), s/p saba 2.22 l/m (90%).nml  . Broken ankle    right foot, wearing a boot  . Cognitive change 06/10/2016  . Dementia ([place])   . Depression   . Diabetes mellitus 2003   Type II  . Diverticulosis   . Dyspepsia   . Fatty liver 12/19/2010  . GERD (gastroesophageal reflux disease)   . Hearing loss    bilateral hearing aids   . Herpes simplex virus (HSV) infection   . High cholesterol   . Hyperlipemia   . Hypertension   . Hypothyroidism 09/08/2013  . IBS (irritable bowel syndrome)   . Insomnia   . Internal hemorrhoids   . Joint pain 08/08/2016  . Low back pain 08/10/2016  . OSA (obstructive sleep apnea)    Does not use CPAP - old one broken, new sleep study to be scheduled and then given a new CPAP machine  . Plantar fasciitis    no longer an issue  . Sun-damaged skin 12/09/2016  . SVD (spontaneous vaginal delivery)    x 4  . Vaginal Pap smear, abnormal   . Vitamin D deficiency 03/04/2016  . Wears partial dentures    upper dentures and lower partial     Social History   Socioeconomic History  .  Marital status: Widowed    Spouse name: Not on file  . Number of children: 4  . Years of education: Not on file  . Highest education level: Not on file  Occupational History  . Occupation: retired    Fish farm manager: [employer]  Social Needs  . Financial resource strain: Not on file  . Food insecurity    Worry: Not on file    Inability: Not on file  . Transportation needs    Medical: Not on file    Non-medical: Not on file  Tobacco Use  . Smoking status: Never Smoker  . Smokeless tobacco: Never Used  Substance and Sexual Activity  . Alcohol use: Yes    Alcohol/week: 0.0 standard drinks    Comment: Rare occasions   . Drug use: No  . Sexual activity: Not Currently    Birth control/protection: Post-menopausal  Lifestyle  . Physical activity    Days per week: Not on file    Minutes per session: Not on file  . Stress: Not on file  Relationships  . Social Herbalist on phone: Not on file    Gets together: Not on file  Attends religious service: Not on file    Active member of club or organization: Not on file    Attends meetings of clubs or organizations: Not on file    Relationship status: Not on file  . Intimate partner violence    Fear of current or ex partner: Not on file    Emotionally abused: Not on file    Physically abused: Not on file    Forced sexual activity: Not on file  Other Topics Concern  . Not on file  Social History Narrative   No caffeine drinks daily     Past Surgical History:  Procedure Laterality Date  . APPENDECTOMY     age 7  . CATARACT EXTRACTION Bilateral 2016   Dr. Bing Plume  . CATARACT EXTRACTION, BILATERAL     12/8 and 12/14  . COLONOSCOPY  06/03/2010, 08/25/2011    severeal - polyps and diverticulosis   . DILATATION & CURETTAGE/HYSTEROSCOPY WITH MYOSURE N/A 04/01/2018   Procedure: DILATATION & CURETTAGE/HYSTEROSCOPY WITH MYOSURE POLYPECTOMY;  Surgeon: Emily Filbert, MD;  Location: [facility];  Service: Gynecology;  Laterality: N/A;  . EYE  SURGERY     cateracts removed bilateral  . GASTRECTOMY     partial - ulcer as a teen  . HAND SURGERY Right    thumb   . HEMORRHOID BANDING    . KNEE ARTHROSCOPY Left 1998  . NASAL SEPTUM SURGERY    . RHINOPLASTY    . TOE SURGERY Right 1995    Family History  Problem Relation Age of Onset  . Diabetes Mother        type II  . Heart attack Mother 33  . Stroke Mother   . Hyperlipidemia Mother   . Colon cancer Sister   . Cancer Sister        GI  . Heart disease Sister   . Asthma Maternal Grandmother   . Mental illness Son        PTSD from [employer]  . Parkinsonism Father   . Cancer Sister        stage 4 bladder cancer  . Colon polyps Sister   . Ulcers Other        bleeding gastric  . Heart defect Other        child  . Other Other        perforated bowel, child  . Cancer Brother        pancreatic cancer  . Amblyopia Neg Hx   . Blindness Neg Hx   . Cataracts Neg Hx   . Glaucoma Neg Hx   . Macular degeneration Neg Hx   . Retinal detachment Neg Hx   . Strabismus Neg Hx   . Retinitis pigmentosa Neg Hx     Allergies  Allergen Reactions  . Compazine [Prochlorperazine Edisylate]     Comma for 3 days  . Sulfa Antibiotics Itching    severe itching  . Tetanus Toxoid Other (See Comments)    convulsions    Current Outpatient Medications on File Prior to Visit  Medication Sig Dispense Refill  . albuterol (PROVENTIL HFA;VENTOLIN HFA) 108 (90 Base) MCG/ACT inhaler Inhale 2 puffs into the lungs every 6 (six) hours as needed for wheezing or shortness of breath. 1 Inhaler 2  . albuterol (VENTOLIN HFA) 108 (90 Base) MCG/ACT inhaler INHALE 2 PUFFS BY MOUTH INTO THE LUNGS EVERY 4 (FOUR) HOURS AS NEEDED FOR SHORTNESS OF BREATH AND WHEEZING 18 g 1  . Alcohol Swabs (B-D SINGLE USE SWABS  REGULAR) PADS Use as directed to check blood sugar. DX E11.9 100 each 3  . atenolol (TENORMIN) 25 MG tablet TAKE 1 TABLET EVERY DAY 90 tablet 3  . atorvastatin (LIPITOR) 80 MG tablet Take 1 tablet  (80 mg total) by mouth daily. 90 tablet 1  . budesonide-formoterol (SYMBICORT) 80-4.5 MCG/ACT inhaler Inhale 2 puffs into the lungs 2 (two) times a day. 1 Inhaler 3  . cephALEXin (KEFLEX) 500 MG capsule Take 1 capsule (500 mg total) by mouth 3 (three) times daily. 21 capsule 0  . cetirizine (ZYRTEC) 10 MG tablet Take 1 tablet (10 mg total) by mouth daily as needed for allergies. 30 tablet 11  . Cholecalciferol (VITAMIN D3) 2000 units TABS Take 1 tablet by mouth daily.    . diclofenac (VOLTAREN) 75 MG EC tablet Take 1 tablet (75 mg total) by mouth 2 (two) times daily. 60 tablet 1  . dicyclomine (BENTYL) 10 MG capsule TAKE 1 CAPSULE BY MOUTH EVERY 4 TO 6 HOURS AS NEEDED FOR ABDOMINAL PAIN AND CRAMPING OR DIARRHEA 60 capsule 1  . diphenoxylate-atropine (LOMOTIL) 2.5-0.025 MG tablet Take 1 tablet by mouth every morning. If continued diarrhea in the next 2-3 days may increase to twice daily. 30 tablet 3  . donepezil (ARICEPT) 10 MG tablet Take 1 tablet (10 mg total) by mouth at bedtime. 30 tablet 0  . doxycycline (VIBRA-TABS) 100 MG tablet Take 1 tablet (100 mg total) by mouth 2 (two) times daily. 20 tablet 0  . escitalopram (LEXAPRO) 20 MG tablet Take 1 tablet (20 mg total) by mouth daily. 30 tablet 0  . fenofibrate 160 MG tablet TAKE 1 TABLET (160 MG TOTAL) BY MOUTH DAILY. 90 tablet 2  . fluticasone (FLOVENT HFA) 110 MCG/ACT inhaler Inhale 1-2 puffs into the lungs 2 (two) times daily. 1 Inhaler 3  . gabapentin (NEURONTIN) 100 MG capsule Take 1 capsule (100 mg total) by mouth 3 (three) times daily. 90 capsule 3  . glimepiride (AMARYL) 2 MG tablet Take 2 tablets (4 mg total) by mouth 2 (two) times daily. 90 tablet 3  . glucose blood (ONETOUCH VERIO) test strip Use as instructed to check blood sugar 2 times a day 150 each 12  . hydrochlorothiazide (HYDRODIURIL) 25 MG tablet Take 1 tablet (25 mg total) by mouth daily. 30 tablet 3  . ketorolac (ACULAR) 0.5 % ophthalmic solution Place 1 drop into the right  eye 4 (four) times daily. (Patient taking differently: Place 1 drop into both eyes 4 (four) times daily. ) 10 mL 0  . Lancets Ultra Fine MISC Use to check blood sugar two times a day 200 each 12  . levothyroxine (SYNTHROID, LEVOTHROID) 25 MCG tablet TAKE 1 TABLET DAILY BEFORE BREAKFAST. 90 tablet 0  . losartan (COZAAR) 50 MG tablet TAKE 1 TABLET EVERY DAY 90 tablet 0  . montelukast (SINGULAIR) 10 MG tablet TAKE 1 TABLET (10 MG TOTAL) BY MOUTH AT BEDTIME AS NEEDED (ALLERGIES). 30 tablet 3  . montelukast (SINGULAIR) 10 MG tablet Take 1 tablet (10 mg total) by mouth at bedtime. 30 tablet 6  . Multiple Vitamins-Minerals (CENTRUM SILVER PO) Take 1 tablet by mouth daily.      . pantoprazole (PROTONIX) 40 MG tablet TAKE 1 TABLET EVERY DAY 90 tablet 0  . prednisoLONE acetate (PRED FORTE) 1 % ophthalmic suspension Place 1 drop into the right eye 4 (four) times daily. 10 mL 0  . Probiotic Product (ALIGN) 4 MG CAPS Take 1 capsule by mouth daily. Reported on  07/02/2015    . sitaGLIPtin (JANUVIA) 100 MG tablet Take 1 tablet (100 mg total) by mouth daily. 90 tablet 3  . triamcinolone cream (KENALOG) 0.1 % Apply 1 application topically 2 (two) times daily.   0  . valACYclovir (VALTREX) 1000 MG tablet Take 1 tablet (1,000 mg total) by mouth 3 (three) times daily. 21 tablet 0   No current facility-administered medications on file prior to visit.     BP (!) 156/80 (BP Location: Right Arm, Patient Position: Sitting, Cuff Size: Small)   Pulse 74   Temp 98.2 F (36.8 C) (Oral)   Resp 16   Ht 5' 2.5" (1.588 m)   Wt 157 lb 9.6 oz (71.5 kg)   LMP  (LMP Unknown)   SpO2 96%   BMI 28.37 kg/m       Objective:   Physical Exam Constitutional:      Appearance: She is well-developed.  Neck:     Musculoskeletal: Neck supple.     Thyroid: No thyromegaly.  Cardiovascular:     Rate and Rhythm: Normal rate and regular rhythm.     Heart sounds: Normal heart sounds. No murmur.  Pulmonary:     Effort: Pulmonary  effort is normal. No respiratory distress.     Breath sounds: Normal breath sounds. No wheezing.  Skin:    General: Skin is warm and dry.       Neurological:     Mental Status: She is alert and oriented to person, place, and time.  Psychiatric:        Behavior: Behavior normal.        Thought Content: Thought content normal.        Judgment: Judgment normal.      Left upper posterior shoulder with erythema in a somewhat linear pattern.        Assessment & Plan:  1) Cellulitis of the breast- improving. Pt is advised to keep her appointment tomorrow with the breast center.  2) Herpes zoster- pt is advised to continue valtrex and to call if symptoms worsen or if not improved in 7-10 days.

## 2019-01-19 ENCOUNTER — Other Ambulatory Visit: Payer: Medicare HMO

## 2019-02-08 ENCOUNTER — Ambulatory Visit: Payer: Self-pay

## 2019-02-08 ENCOUNTER — Ambulatory Visit (INDEPENDENT_AMBULATORY_CARE_PROVIDER_SITE_OTHER): Payer: Medicare HMO | Admitting: Medical

## 2019-02-08 ENCOUNTER — Other Ambulatory Visit: Payer: Self-pay

## 2019-02-08 DIAGNOSIS — K589 Irritable bowel syndrome without diarrhea: Secondary | ICD-10-CM | POA: Diagnosis not present

## 2019-02-08 DIAGNOSIS — E119 Type 2 diabetes mellitus without complications: Secondary | ICD-10-CM | POA: Diagnosis not present

## 2019-02-08 NOTE — Telephone Encounter (Signed)
Pt called stating that she has been sick with the diarrhea for att least 1 week.  She states that her BS are elevated 240 today and 245 yesterday. She is taking all her medications.  She states that she has not vomited but her stomach feels constantly sick. She states that her stools are green soft and mushy. She feels weak. She is drinking liquids but has no apitite. She is questioning that perhaps this is COVID-19. NT reviewed the testing process that are sites for testing are open no need for Dr Grayce Sessions and appointment. Pt verbalized understanding. Care advice read to patient. Patient verbalized understanding. Call transferred to office for scheduling.  Reason for Disposition . [1] MODERATE diarrhea (e.g., 4-6 times / day more than normal) AND [2] present > 48 hours (2 days)  Answer Assessment - Initial Assessment Questions 1. DIARRHEA SEVERITY: "How bad is the diarrhea?" "How many extra stools have you had in the past 24 hours than normal?"    - NO DIARRHEA (SCALE 0)   - MILD (SCALE 1-3): Few loose or mushy BMs; increase of 1-3 stools over normal daily number of stools; mild increase in ostomy output.   -  MODERATE (SCALE 4-7): Increase of 4-6 stools daily over normal; moderate increase in ostomy output. * SEVERE (SCALE 8-10; OR 'WORST POSSIBLE'): Increase of 7 or more stools daily over normal; moderate increase in ostomy output; incontinence.     Soft green mush 2. ONSET: "When did the diarrhea begin?"      At least 1 week ago 3. BM CONSISTENCY: "How loose or watery is the diarrhea?"      Soft mush 4. VOMITING: "Are you also vomiting?" If so, ask: "How many times in the past 24 hours?"      Sick feeling but not vomiting 5. ABDOMINAL PAIN: "Are you having any abdominal pain?" If yes: "What does it feel like?" (e.g., crampy, dull, intermittent, constant)      Constant stomach ache 6. ABDOMINAL PAIN SEVERITY: If present, ask: "How bad is the pain?"  (e.g., Scale 1-10; mild, moderate, or severe)   -  MILD (1-3): doesn't interfere with normal activities, abdomen soft and not tender to touch    - MODERATE (4-7): interferes with normal activities or awakens from sleep, tender to touch    - SEVERE (8-10): excruciating pain, doubled over, unable to do any normal activities       no 7. ORAL INTAKE: If vomiting, "Have you been able to drink liquids?" "How much fluids have you had in the past 24 hours?"     Drinking fluids but has no appitite   8. HYDRATION: "Any signs of dehydration?" (e.g., dry mouth [not just dry lips], too weak to stand, dizziness, new weight loss) "When did you last urinate?"    No weight loss, weak gets dizzy if moving to fast 9. EXPOSURE: "Have you traveled to a foreign country recently?" "Have you been exposed to anyone with diarrhea?" "Could you have eaten any food that was spoiled?"     no 10. ANTIBIOTIC USE: "Are you taking antibiotics now or have you taken antibiotics in the past 2 months?"       Yes took amoxicillin 1 month ago 11. OTHER SYMPTOMS: "Do you have any other symptoms?" (e.g., fever, blood in stool)       no 12. PREGNANCY: "Is there any chance you are pregnant?" "When was your last menstrual period?"       N/A  Protocols used: DIARRHEA-A-AH

## 2019-02-08 NOTE — Telephone Encounter (Signed)
Appt scheduled

## 2019-02-08 NOTE — Progress Notes (Signed)
Subjective:    Patient ID: Crystal [name], female    DOB: April 14, 1951, 68 y.o.   MRN: [id number]  HPI Video visit.  I connected with Crystal [name] on 02/08/19 at  3:00 PM EDT by telephone and verified that I am speaking with the correct person using two identifiers.  Location: Patient: HOME Provider: HOME.     History of Present Illness: Pt states he sugars over past weeks have been 180-247 over past week.. Pt states this is unusual. Pt has no chest congestion, no uti symptoms and no skin infection.  Some loose stools/diarrhea over past week. Pt admits most are formed. Pt states with she has some stomach cramps and hx of ibs. Responded to bentyl in the past but remembers did not refill//take meds on regular basis and will have recurrent mild abdomen discomfort with loose stools.   Pt has been eating watermelons lately. Also eating a lot of bread. Pt is on [place] and amaryl. She states could not take metformin in the past and not sure why she was taken off invokana.  Last a1c was 9.7. Pt only on glimepiride and januvia but no recent a1c.    Observations/Objective: General-no acute distress, pleasant, oriented. Lungs- on inspection lungs appear unlabored. Neck- no tracheal deviation or jvd on inspection. Neuro- gross motor function appears intact.  Assessment and Plan: Your sugars have been high and your a1c has been elevated in the past as well. I want you to get a1c tomorrow as well as the other labs Dr. Charlett Blake put in as future order in May. I will follow a1c and send your endocrinologist the report to defer med management to her. Eat low sugar diets/cut back on bread as you admit eating a lot of bread recently.  For hx of ibs get back on bentyl. If you have consistent water stools then notify me and would get gastro panel.  Follow up 7-10 days or as needed.  Mackie Pai, PA-C  Follow Up Instructions:    I discussed the assessment and treatment plan with  the patient. The patient was provided an opportunity to ask questions and all were answered. The patient agreed with the plan and demonstrated an understanding of the instructions.   The patient was advised to call back or seek an in-person evaluation if the symptoms worsen or if the condition fails to improve as anticipated.  I provided 25  minutes of non-face-to-face time during this encounter.   Mackie Pai, PA-C    Review of Systems  Constitutional: Negative for chills, fatigue and fever.  HENT: Negative for congestion, ear discharge, ear pain, postnasal drip, rhinorrhea, sinus pressure and sinus pain.   Respiratory: Negative for cough, chest tightness, shortness of breath and wheezing.   Cardiovascular: Negative for chest pain and palpitations.  Gastrointestinal: Negative for abdominal pain, diarrhea, nausea, rectal pain and vomiting.       Hx mild abdomen pain. Hx of ibs.  Occasional loose stools but no diarrhea recently.  Endocrine: Negative for polydipsia, polyphagia and polyuria.  Genitourinary: Negative for dysuria and frequency.  Musculoskeletal: Negative for back pain and myalgias.  Skin: Negative for rash.  Neurological: Negative for dizziness, syncope and weakness.  Hematological: Negative for adenopathy. Does not bruise/bleed easily.  Psychiatric/Behavioral: Negative for behavioral problems and confusion.    Past Medical History:  Diagnosis Date  . Acute peptic ulcer of stomach    As a teenager  . Allergy    dust, cock roach,grass,weeds,molds  . Anxiety   .  Arthritis    knees, hands, shoulders  . Asthma    02/08/10 FEV1 1.98 (80%), s/p saba 2.22 l/m (90%).nml  . Broken ankle    right foot, wearing a boot  . Cognitive change 06/10/2016  . Dementia ([place])   . Depression   . Diabetes mellitus 2003   Type II  . Diverticulosis   . Dyspepsia   . Fatty liver 12/19/2010  . GERD (gastroesophageal reflux disease)   . Hearing loss    bilateral hearing aids   .  Herpes simplex virus (HSV) infection   . High cholesterol   . Hyperlipemia   . Hypertension   . Hypothyroidism 09/08/2013  . IBS (irritable bowel syndrome)   . Insomnia   . Internal hemorrhoids   . Joint pain 08/08/2016  . Low back pain 08/10/2016  . OSA (obstructive sleep apnea)    Does not use CPAP - old one broken, new sleep study to be scheduled and then given a new CPAP machine  . Plantar fasciitis    no longer an issue  . Sun-damaged skin 12/09/2016  . SVD (spontaneous vaginal delivery)    x 4  . Vaginal Pap smear, abnormal   . Vitamin D deficiency 03/04/2016  . Wears partial dentures    upper dentures and lower partial     Social History   Socioeconomic History  . Marital status: Widowed    Spouse name: Not on file  . Number of children: 4  . Years of education: Not on file  . Highest education level: Not on file  Occupational History  . Occupation: retired    Fish farm manager: [employer]  Social Needs  . Financial resource strain: Not on file  . Food insecurity    Worry: Not on file    Inability: Not on file  . Transportation needs    Medical: Not on file    Non-medical: Not on file  Tobacco Use  . Smoking status: Never Smoker  . Smokeless tobacco: Never Used  Substance and Sexual Activity  . Alcohol use: Yes    Alcohol/week: 0.0 standard drinks    Comment: Rare occasions   . Drug use: No  . Sexual activity: Not Currently    Birth control/protection: Post-menopausal  Lifestyle  . Physical activity    Days per week: Not on file    Minutes per session: Not on file  . Stress: Not on file  Relationships  . Social Herbalist on phone: Not on file    Gets together: Not on file    Attends religious service: Not on file    Active member of club or organization: Not on file    Attends meetings of clubs or organizations: Not on file    Relationship status: Not on file  . Intimate partner violence    Fear of current or ex partner: Not on file    Emotionally  abused: Not on file    Physically abused: Not on file    Forced sexual activity: Not on file  Other Topics Concern  . Not on file  Social History Narrative   No caffeine drinks daily     Past Surgical History:  Procedure Laterality Date  . APPENDECTOMY     age 53  . CATARACT EXTRACTION Bilateral 2016   Dr. Bing Plume  . CATARACT EXTRACTION, BILATERAL     12/8 and 12/14  . COLONOSCOPY  06/03/2010, 08/25/2011    severeal - polyps and diverticulosis   . DILATATION &  CURETTAGE/HYSTEROSCOPY WITH MYOSURE N/A 04/01/2018   Procedure: DILATATION & CURETTAGE/HYSTEROSCOPY WITH MYOSURE POLYPECTOMY;  Surgeon: Emily Filbert, MD;  Location: [facility];  Service: Gynecology;  Laterality: N/A;  . EYE SURGERY     cateracts removed bilateral  . GASTRECTOMY     partial - ulcer as a teen  . HAND SURGERY Right    thumb   . HEMORRHOID BANDING    . KNEE ARTHROSCOPY Left 1998  . NASAL SEPTUM SURGERY    . RHINOPLASTY    . TOE SURGERY Right 1995    Family History  Problem Relation Age of Onset  . Diabetes Mother        type II  . Heart attack Mother 22  . Stroke Mother   . Hyperlipidemia Mother   . Colon cancer Sister   . Cancer Sister        GI  . Heart disease Sister   . Asthma Maternal Grandmother   . Mental illness Son        PTSD from [employer]  . Parkinsonism Father   . Cancer Sister        stage 4 bladder cancer  . Colon polyps Sister   . Ulcers Other        bleeding gastric  . Heart defect Other        child  . Other Other        perforated bowel, child  . Cancer Brother        pancreatic cancer  . Amblyopia Neg Hx   . Blindness Neg Hx   . Cataracts Neg Hx   . Glaucoma Neg Hx   . Macular degeneration Neg Hx   . Retinal detachment Neg Hx   . Strabismus Neg Hx   . Retinitis pigmentosa Neg Hx     Allergies  Allergen Reactions  . Compazine [Prochlorperazine Edisylate]     Comma for 3 days  . Sulfa Antibiotics Itching    severe itching  . Tetanus Toxoid Other (See Comments)     convulsions    Current Outpatient Medications on File Prior to Visit  Medication Sig Dispense Refill  . albuterol (PROVENTIL HFA;VENTOLIN HFA) 108 (90 Base) MCG/ACT inhaler Inhale 2 puffs into the lungs every 6 (six) hours as needed for wheezing or shortness of breath. 1 Inhaler 2  . albuterol (VENTOLIN HFA) 108 (90 Base) MCG/ACT inhaler INHALE 2 PUFFS BY MOUTH INTO THE LUNGS EVERY 4 (FOUR) HOURS AS NEEDED FOR SHORTNESS OF BREATH AND WHEEZING 18 g 1  . Alcohol Swabs (B-D SINGLE USE SWABS REGULAR) PADS Use as directed to check blood sugar. DX E11.9 100 each 3  . atenolol (TENORMIN) 25 MG tablet TAKE 1 TABLET EVERY DAY 90 tablet 3  . atorvastatin (LIPITOR) 80 MG tablet Take 1 tablet (80 mg total) by mouth daily. 90 tablet 1  . budesonide-formoterol (SYMBICORT) 80-4.5 MCG/ACT inhaler Inhale 2 puffs into the lungs 2 (two) times a day. 1 Inhaler 3  . cephALEXin (KEFLEX) 500 MG capsule Take 1 capsule (500 mg total) by mouth 3 (three) times daily. 21 capsule 0  . cetirizine (ZYRTEC) 10 MG tablet Take 1 tablet (10 mg total) by mouth daily as needed for allergies. 30 tablet 11  . Cholecalciferol (VITAMIN D3) 2000 units TABS Take 1 tablet by mouth daily.    . diclofenac (VOLTAREN) 75 MG EC tablet Take 1 tablet (75 mg total) by mouth 2 (two) times daily. 60 tablet 1  . dicyclomine (BENTYL) 10 MG capsule  TAKE 1 CAPSULE BY MOUTH EVERY 4 TO 6 HOURS AS NEEDED FOR ABDOMINAL PAIN AND CRAMPING OR DIARRHEA 60 capsule 1  . diphenoxylate-atropine (LOMOTIL) 2.5-0.025 MG tablet Take 1 tablet by mouth every morning. If continued diarrhea in the next 2-3 days may increase to twice daily. 30 tablet 3  . donepezil (ARICEPT) 10 MG tablet Take 1 tablet (10 mg total) by mouth at bedtime. 30 tablet 0  . doxycycline (VIBRA-TABS) 100 MG tablet Take 1 tablet (100 mg total) by mouth 2 (two) times daily. 20 tablet 0  . escitalopram (LEXAPRO) 20 MG tablet Take 1 tablet (20 mg total) by mouth daily. 30 tablet 0  . fenofibrate 160 MG  tablet TAKE 1 TABLET (160 MG TOTAL) BY MOUTH DAILY. 90 tablet 2  . fluticasone (FLOVENT HFA) 110 MCG/ACT inhaler Inhale 1-2 puffs into the lungs 2 (two) times daily. 1 Inhaler 3  . gabapentin (NEURONTIN) 100 MG capsule Take 1 capsule (100 mg total) by mouth 3 (three) times daily. 90 capsule 3  . glimepiride (AMARYL) 2 MG tablet Take 2 tablets (4 mg total) by mouth 2 (two) times daily. 90 tablet 3  . glucose blood (ONETOUCH VERIO) test strip Use as instructed to check blood sugar 2 times a day 150 each 12  . hydrochlorothiazide (HYDRODIURIL) 25 MG tablet Take 1 tablet (25 mg total) by mouth daily. 30 tablet 3  . ketorolac (ACULAR) 0.5 % ophthalmic solution Place 1 drop into the right eye 4 (four) times daily. (Patient taking differently: Place 1 drop into both eyes 4 (four) times daily. ) 10 mL 0  . Lancets Ultra Fine MISC Use to check blood sugar two times a day 200 each 12  . levothyroxine (SYNTHROID, LEVOTHROID) 25 MCG tablet TAKE 1 TABLET DAILY BEFORE BREAKFAST. 90 tablet 0  . losartan (COZAAR) 50 MG tablet TAKE 1 TABLET EVERY DAY 90 tablet 0  . montelukast (SINGULAIR) 10 MG tablet TAKE 1 TABLET (10 MG TOTAL) BY MOUTH AT BEDTIME AS NEEDED (ALLERGIES). 30 tablet 3  . montelukast (SINGULAIR) 10 MG tablet Take 1 tablet (10 mg total) by mouth at bedtime. 30 tablet 6  . Multiple Vitamins-Minerals (CENTRUM SILVER PO) Take 1 tablet by mouth daily.      . pantoprazole (PROTONIX) 40 MG tablet TAKE 1 TABLET EVERY DAY 90 tablet 0  . prednisoLONE acetate (PRED FORTE) 1 % ophthalmic suspension Place 1 drop into the right eye 4 (four) times daily. 10 mL 0  . Probiotic Product (ALIGN) 4 MG CAPS Take 1 capsule by mouth daily. Reported on 07/02/2015    . sitaGLIPtin (JANUVIA) 100 MG tablet Take 1 tablet (100 mg total) by mouth daily. 90 tablet 3  . triamcinolone cream (KENALOG) 0.1 % Apply 1 application topically 2 (two) times daily.   0  . valACYclovir (VALTREX) 1000 MG tablet Take 1 tablet (1,000 mg total) by  mouth 3 (three) times daily. 21 tablet 0   No current facility-administered medications on file prior to visit.     LMP  (LMP Unknown)       Objective:   Physical Exam        Assessment & Plan:

## 2019-02-08 NOTE — Patient Instructions (Signed)
Your sugars have been high and your a1c has been elevated in the past as well. I want you to get a1c tomorrow as well as the other labs Dr. Charlett Blake put in as future order in May. I will follow a1c and send your endocrinologist the report to defer med management to her. Eat low sugar diets/cut back on bread as you admit eating a lot of bread recently.  For hx of ibs get back on bentyl. If you have consistent water stools then notify me and would get gastro panel.  Follow up 7-10 days or as needed.

## 2019-02-09 ENCOUNTER — Other Ambulatory Visit: Payer: Self-pay

## 2019-02-09 MED ORDER — ATENOLOL 25 MG PO TABS: 25.0000 mg | ORAL_TABLET | Freq: Every day | ORAL | 3 refills | Status: DC

## 2019-02-09 MED ORDER — LOSARTAN POTASSIUM 50 MG PO TABS: 50.0000 mg | ORAL_TABLET | Freq: Every day | ORAL | 3 refills | Status: DC

## 2019-02-09 MED ORDER — PANTOPRAZOLE SODIUM 40 MG PO TBEC: 40.0000 mg | DELAYED_RELEASE_TABLET | Freq: Every day | ORAL | 3 refills | Status: DC

## 2019-02-09 NOTE — Telephone Encounter (Signed)
Received forms from [facility] for patient medication request

## 2019-02-10 ENCOUNTER — Ambulatory Visit
Admission: RE | Admit: 2019-02-10 | Discharge: 2019-02-10 | Disposition: A | Discharge status: Home / Self Care | Payer: Medicare HMO | Source: Ambulatory Visit | Attending: Family | Admitting: Family

## 2019-02-10 ENCOUNTER — Other Ambulatory Visit: Payer: Self-pay | Admitting: Family

## 2019-02-10 ENCOUNTER — Other Ambulatory Visit: Payer: Self-pay

## 2019-02-10 DIAGNOSIS — N649 Disorder of breast, unspecified: Secondary | ICD-10-CM

## 2019-02-10 DIAGNOSIS — N6459 Other signs and symptoms in breast: Secondary | ICD-10-CM | POA: Diagnosis not present

## 2019-02-10 DIAGNOSIS — Z792 Long term (current) use of antibiotics: Secondary | ICD-10-CM | POA: Diagnosis not present

## 2019-03-03 ENCOUNTER — Other Ambulatory Visit: Payer: Self-pay

## 2019-03-04 ENCOUNTER — Other Ambulatory Visit: Payer: Self-pay | Admitting: Family Medicine

## 2019-03-04 NOTE — Telephone Encounter (Signed)
Requested medication (s) are due for refill today: yes  Requested medication (s) are on the active medication list: yes  Last refill:  09/06/18 #90  Future visit scheduled: No  Notes to clinic:  Unable to refill per protocol. Pt due to have TSH level rechecked    Requested Prescriptions  Pending Prescriptions Disp Refills   levothyroxine (SYNTHROID) 25 MCG tablet 90 tablet 0    Sig: Take 1 tablet (25 mcg total) by mouth daily before breakfast.     Endocrinology:  Hypothyroid Agents Failed - 03/04/2019  5:29 PM      Failed - TSH needs to be rechecked within 3 months after an abnormal result. Refill until TSH is due.      Failed - TSH in normal range and within 360 days    TSH  Date Value Ref Range Status  02/23/2018 4.79 (H) 0.35 - 4.50 uIU/mL Final         Passed - Valid encounter within last 12 months    Recent Outpatient Visits          3 weeks ago Diabetes mellitus without complication ([place])   [facility] [place], [place]   1 month ago Cellulitis of breast   [facility] [place], NP   1 month ago Cellulitis of female breast   [facility] [place], NP   1 month ago Breast lesion   [facility] [place], NP   3 months ago Mild intermittent asthma, unspecified whether complicated   [facility], Bonnita Levan, MD

## 2019-03-04 NOTE — Telephone Encounter (Signed)
Medication Refill - Medication: levothyroxine (SYNTHROID, LEVOTHROID) 25 MCG tablet  Has the patient contacted their pharmacy? Yes - pharmacy contacting office (Agent: If no, request that the patient contact the pharmacy for the refill.) (Agent: If yes, when and what did the pharmacy advise?)  Preferred Pharmacy (with phone number or street name):  [facility], [place] [phone] (Phone) [phone] (Fax)   Agent: Please be advised that RX refills may take up to 3 business days. We ask that you follow-up with your pharmacy.

## 2019-03-07 ENCOUNTER — Ambulatory Visit: Payer: Medicare HMO | Admitting: Internal Medicine

## 2019-03-07 ENCOUNTER — Other Ambulatory Visit: Payer: Self-pay

## 2019-03-07 ENCOUNTER — Encounter: Payer: Self-pay | Admitting: Internal Medicine

## 2019-03-07 VITALS — BP 138/80 | HR 75 | Ht 62.5 in | Wt 156.0 lb

## 2019-03-07 DIAGNOSIS — E785 Hyperlipidemia, unspecified: Secondary | ICD-10-CM | POA: Diagnosis not present

## 2019-03-07 DIAGNOSIS — G63 Polyneuropathy in diseases classified elsewhere: Secondary | ICD-10-CM | POA: Diagnosis not present

## 2019-03-07 DIAGNOSIS — E1149 Type 2 diabetes mellitus with other diabetic neurological complication: Secondary | ICD-10-CM | POA: Diagnosis not present

## 2019-03-07 DIAGNOSIS — E039 Hypothyroidism, unspecified: Secondary | ICD-10-CM

## 2019-03-07 LAB — LIPID PANEL
Cholesterol: 151 mg/dL (ref 0–200)
HDL: 44.7 mg/dL (ref >39.00)
LDL Cholesterol: 68 mg/dL (ref 0–99)
NonHDL: 105.9
Total CHOL/HDL Ratio: 3
Triglycerides: 192 mg/dL — ABNORMAL HIGH (ref 0.0–149.0)
VLDL: 38.4 mg/dL (ref 0.0–40.0)

## 2019-03-07 LAB — POCT GLYCOSYLATED HEMOGLOBIN (HGB A1C): Hemoglobin A1C: 7.9 % — AB (ref 4.0–5.6)

## 2019-03-07 MED ORDER — LEVOTHYROXINE SODIUM 25 MCG PO TABS: 25.0000 ug | ORAL_TABLET | Freq: Every day | ORAL | 1 refills | Status: DC

## 2019-03-07 NOTE — Patient Instructions (Addendum)
Patient Instructions  Please continue: - Januvia 100 mg before b'fast - Amaryl 2 mg 2x a day before meals  Please continue: - Levothyroxine 25 mcg daily  Take the thyroid hormone every day, with water, at least 30 minutes before breakfast, separated by at least 4 hours from: - acid reflux medications - calcium - iron - multivitamins - probiotics  Please return in 4 months with your sugar log.

## 2019-03-07 NOTE — Progress Notes (Signed)
Patient ID: Crystal [name], female   DOB: 05-Jun-1951, 68 y.o.   MRN: [id number]   HPI: Crystal [name] is a 68 y.o.-year-old female, presenting for follow-up for DM2, dx in 2003, non-insulin-dependent, uncontrolled, with complications (PN, DR).  Last visit 4 months ago.  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  They are living in the same apartment.  She has high stress.  Last hemoglobin A1c was: Lab Results  Component Value Date   HGBA1C 9.7 (A) 10/27/2018   HGBA1C 6.5 (A) 06/22/2018   HGBA1C 7.4 (H) 02/23/2018   Pt is on a regimen of:  - Januvia 100 mg before breakfast - Amaryl 2 mg 2x a day before meals-started 10/2018 In 06/2018, we stopped Amaryl due to lows at lunchtime. She had to stop Jardiance in the past because of increased IBS symptoms and also yeast infections. Metformin gave her diarrhea in the past. Januvia was too expensive in the past Invokana >> cannot remember why stopped  She is checking sugars once a day:  - am: 57, 105-145, 157 >> 85-115 >> 85-116 >> 105-120 >> 115-135 - 2h after b'fast: 199 >> n/c - before lunch: n/c - 2h after lunch: 127-135 >> 120-140s >> n/c >> 57 >> n/c - before dinner: n/c >> 125, 130-140 - 2h after dinner: n/c - bedtime:120-140s >> 135-150 >> lower than 135 >> n/c - nighttime: n/c Lowest sugar was 57 >> 130; she has hypoglycemia awareness in the 80s. Highest sugar was 140 >> 247 (but recently 190).  Glucometer: AccuChek [employer]  Pt's meals are: - Breakfast: bowl of cereals or bacon + egg + toast or egg sandwich or biscuit + gravy + hash browns; Still drinks OJ or milk. - Lunch: leftovers from dinner or tuna + salad dressing + tuna; box of mac and cheese + tuna; raman noodles; cheese + fruit - Dinner: may eat out (chinese)   -No CKD, last BUN/creatinine:  Lab Results  Component Value Date   BUN 18 11/26/2018   BUN 13 06/02/2018   CREATININE 0.89 11/26/2018   CREATININE 0.81 06/02/2018   Lab  Results  Component Value Date   MICRALBCREAT 0.9 04/30/2015   MICRALBCREAT 4.8 11/24/2012   MICRALBCREAT 5.3 12/19/2011   MICRALBCREAT 2.6 04/12/2010  On losartan 50. -+ HL; last set of lipids: Lab Results  Component Value Date   CHOL 142 02/23/2018   HDL 51.70 02/23/2018   LDLCALC 66 02/23/2018   LDLDIRECT 123.0 09/18/2014   TRIG 124.0 02/23/2018   CHOLHDL 3 02/23/2018  On Lipitor 80, fenofibrate 160. - last eye exam was in 09/2018: + DR.  Sees retina specialist: Dr. Coralyn Pear. -+ Numbness and tingling.  Sees podiatry - Dr. Jacqualyn Posey.  She was started on Neurontin by PCP.  She has mild cognitive impairment. On Aricept.  Her TSH was slightly high in 02/2018: Lab Results  Component Value Date   TSH 4.79 (H) 02/23/2018   Pt is on levothyroxine 25 mcg daily, taken: - in am - fasting - with b'fast - no Ca, Fe - + MVI with LT4 - + PPIs with LT4 - not on Biotin  Of note, she has a history of multinodular goiter with a left inferior dominant nodule that was biopsied with benign results.  ROS: Constitutional: no weight gain/no weight loss, no fatigue, no subjective hyperthermia, no subjective hypothermia Eyes: no blurry vision, no xerophthalmia ENT: no sore throat, no nodules palpated in neck, no dysphagia, no odynophagia, no hoarseness  Cardiovascular: no CP/no SOB/no palpitations/no leg swelling Respiratory: no cough/no SOB/no wheezing Gastrointestinal: no N/no V/no D/no C/no acid reflux Musculoskeletal: no muscle aches/no joint aches Skin: no rashes, no hair loss Neurological: no tremors/+ numbness/+ tingling/no dizziness  I reviewed pt's medications, allergies, PMH, social hx, family hx, and changes were documented in the history of present illness. Otherwise, unchanged from my initial visit note.  Past Medical History:  Diagnosis Date  . Acute peptic ulcer of stomach    As a teenager  . Allergy    dust, cock roach,grass,weeds,molds  . Anxiety   . Arthritis    knees,  hands, shoulders  . Asthma    02/08/10 FEV1 1.98 (80%), s/p saba 2.22 l/m (90%).nml  . Broken ankle    right foot, wearing a boot  . Cognitive change 06/10/2016  . Dementia ([place])   . Depression   . Diabetes mellitus 2003   Type II  . Diverticulosis   . Dyspepsia   . Fatty liver 12/19/2010  . GERD (gastroesophageal reflux disease)   . Hearing loss    bilateral hearing aids   . Herpes simplex virus (HSV) infection   . High cholesterol   . Hyperlipemia   . Hypertension   . Hypothyroidism 09/08/2013  . IBS (irritable bowel syndrome)   . Insomnia   . Internal hemorrhoids   . Joint pain 08/08/2016  . Low back pain 08/10/2016  . OSA (obstructive sleep apnea)    Does not use CPAP - old one broken, new sleep study to be scheduled and then given a new CPAP machine  . Plantar fasciitis    no longer an issue  . Sun-damaged skin 12/09/2016  . SVD (spontaneous vaginal delivery)    x 4  . Vaginal Pap smear, abnormal   . Vitamin D deficiency 03/04/2016  . Wears partial dentures    upper dentures and lower partial   Past Surgical History:  Procedure Laterality Date  . APPENDECTOMY     age 55  . CATARACT EXTRACTION Bilateral 2016   Dr. Bing Plume  . CATARACT EXTRACTION, BILATERAL     12/8 and 12/14  . COLONOSCOPY  06/03/2010, 08/25/2011    severeal - polyps and diverticulosis   . DILATATION & CURETTAGE/HYSTEROSCOPY WITH MYOSURE N/A 04/01/2018   Procedure: DILATATION & CURETTAGE/HYSTEROSCOPY WITH MYOSURE POLYPECTOMY;  Surgeon: Emily Filbert, MD;  Location: [facility];  Service: Gynecology;  Laterality: N/A;  . EYE SURGERY     cateracts removed bilateral  . GASTRECTOMY     partial - ulcer as a teen  . HAND SURGERY Right    thumb   . HEMORRHOID BANDING    . KNEE ARTHROSCOPY Left 1998  . NASAL SEPTUM SURGERY    . RHINOPLASTY    . TOE SURGERY Right 1995   Social History   Social History  . Marital status: widowed    Spouse name: [name]  . Number of children: 4  . Years of education: N/A    Occupational History  . retired Orthoptist   Social History Main Topics  . Smoking status: Never Smoker  . Smokeless tobacco: Never Used  . Alcohol use No  . Drug use: No  . Sexual activity: No     Comment: lives with her mother, no dietary restrictions   Other Topics Concern  . Not on file   Social History Narrative   No caffeine drinks daily    Current Outpatient Medications on File Prior to Visit  Medication Sig Dispense Refill  .  albuterol (PROVENTIL HFA;VENTOLIN HFA) 108 (90 Base) MCG/ACT inhaler Inhale 2 puffs into the lungs every 6 (six) hours as needed for wheezing or shortness of breath. 1 Inhaler 2  . albuterol (VENTOLIN HFA) 108 (90 Base) MCG/ACT inhaler INHALE 2 PUFFS BY MOUTH INTO THE LUNGS EVERY 4 (FOUR) HOURS AS NEEDED FOR SHORTNESS OF BREATH AND WHEEZING 18 g 1  . Alcohol Swabs (B-D SINGLE USE SWABS REGULAR) PADS Use as directed to check blood sugar. DX E11.9 100 each 3  . atenolol (TENORMIN) 25 MG tablet Take 1 tablet (25 mg total) by mouth daily. 90 tablet 3  . atorvastatin (LIPITOR) 80 MG tablet Take 1 tablet (80 mg total) by mouth daily. 90 tablet 1  . budesonide-formoterol (SYMBICORT) 80-4.5 MCG/ACT inhaler Inhale 2 puffs into the lungs 2 (two) times a day. 1 Inhaler 3  . cephALEXin (KEFLEX) 500 MG capsule Take 1 capsule (500 mg total) by mouth 3 (three) times daily. 21 capsule 0  . cetirizine (ZYRTEC) 10 MG tablet Take 1 tablet (10 mg total) by mouth daily as needed for allergies. 30 tablet 11  . Cholecalciferol (VITAMIN D3) 2000 units TABS Take 1 tablet by mouth daily.    . diclofenac (VOLTAREN) 75 MG EC tablet Take 1 tablet (75 mg total) by mouth 2 (two) times daily. 60 tablet 1  . dicyclomine (BENTYL) 10 MG capsule TAKE 1 CAPSULE BY MOUTH EVERY 4 TO 6 HOURS AS NEEDED FOR ABDOMINAL PAIN AND CRAMPING OR DIARRHEA 60 capsule 1  . diphenoxylate-atropine (LOMOTIL) 2.5-0.025 MG tablet Take 1 tablet by mouth every morning. If continued diarrhea in the next 2-3 days may  increase to twice daily. 30 tablet 3  . donepezil (ARICEPT) 10 MG tablet Take 1 tablet (10 mg total) by mouth at bedtime. 30 tablet 0  . doxycycline (VIBRA-TABS) 100 MG tablet Take 1 tablet (100 mg total) by mouth 2 (two) times daily. 20 tablet 0  . escitalopram (LEXAPRO) 20 MG tablet Take 1 tablet (20 mg total) by mouth daily. 30 tablet 0  . fenofibrate 160 MG tablet TAKE 1 TABLET (160 MG TOTAL) BY MOUTH DAILY. 90 tablet 2  . fluticasone (FLOVENT HFA) 110 MCG/ACT inhaler Inhale 1-2 puffs into the lungs 2 (two) times daily. 1 Inhaler 3  . gabapentin (NEURONTIN) 100 MG capsule Take 1 capsule (100 mg total) by mouth 3 (three) times daily. 90 capsule 3  . glimepiride (AMARYL) 2 MG tablet Take 2 tablets (4 mg total) by mouth 2 (two) times daily. 90 tablet 3  . glucose blood (ONETOUCH VERIO) test strip Use as instructed to check blood sugar 2 times a day 150 each 12  . hydrochlorothiazide (HYDRODIURIL) 25 MG tablet Take 1 tablet (25 mg total) by mouth daily. 30 tablet 3  . ketorolac (ACULAR) 0.5 % ophthalmic solution Place 1 drop into the right eye 4 (four) times daily. (Patient taking differently: Place 1 drop into both eyes 4 (four) times daily. ) 10 mL 0  . Lancets Ultra Fine MISC Use to check blood sugar two times a day 200 each 12  . levothyroxine (SYNTHROID, LEVOTHROID) 25 MCG tablet TAKE 1 TABLET DAILY BEFORE BREAKFAST. 90 tablet 0  . losartan (COZAAR) 50 MG tablet Take 1 tablet (50 mg total) by mouth daily. 90 tablet 3  . montelukast (SINGULAIR) 10 MG tablet TAKE 1 TABLET (10 MG TOTAL) BY MOUTH AT BEDTIME AS NEEDED (ALLERGIES). 30 tablet 3  . montelukast (SINGULAIR) 10 MG tablet Take 1 tablet (10 mg total) by  mouth at bedtime. 30 tablet 6  . Multiple Vitamins-Minerals (CENTRUM SILVER PO) Take 1 tablet by mouth daily.      . pantoprazole (PROTONIX) 40 MG tablet Take 1 tablet (40 mg total) by mouth daily. 90 tablet 3  . prednisoLONE acetate (PRED FORTE) 1 % ophthalmic suspension Place 1 drop into  the right eye 4 (four) times daily. 10 mL 0  . Probiotic Product (ALIGN) 4 MG CAPS Take 1 capsule by mouth daily. Reported on 07/02/2015    . sitaGLIPtin (JANUVIA) 100 MG tablet Take 1 tablet (100 mg total) by mouth daily. 90 tablet 3  . triamcinolone cream (KENALOG) 0.1 % Apply 1 application topically 2 (two) times daily.   0  . valACYclovir (VALTREX) 1000 MG tablet Take 1 tablet (1,000 mg total) by mouth 3 (three) times daily. 21 tablet 0   No current facility-administered medications on file prior to visit.    Allergies  Allergen Reactions  . Compazine [Prochlorperazine Edisylate]     Comma for 3 days  . Sulfa Antibiotics Itching    severe itching  . Tetanus Toxoid Other (See Comments)    convulsions   Family History  Problem Relation Age of Onset  . Diabetes Mother        type II  . Heart attack Mother 22  . Stroke Mother   . Hyperlipidemia Mother   . Colon cancer Sister   . Cancer Sister        GI  . Heart disease Sister   . Asthma Maternal Grandmother   . Mental illness Son        PTSD from [employer]  . Parkinsonism Father   . Cancer Sister        stage 4 bladder cancer  . Colon polyps Sister   . Ulcers Other        bleeding gastric  . Heart defect Other        child  . Other Other        perforated bowel, child  . Cancer Brother        pancreatic cancer  . Amblyopia Neg Hx   . Blindness Neg Hx   . Cataracts Neg Hx   . Glaucoma Neg Hx   . Macular degeneration Neg Hx   . Retinal detachment Neg Hx   . Strabismus Neg Hx   . Retinitis pigmentosa Neg Hx    Pt has FH of DM in mother.  PE: BP 138/80   Pulse 75   Ht 5' 2.5" (1.588 m)   Wt 156 lb (70.8 kg)   LMP  (LMP Unknown)   SpO2 95%   BMI 28.08 kg/m  Wt Readings from Last 3 Encounters:  03/07/19 156 lb (70.8 kg)  01/18/19 157 lb 9.6 oz (71.5 kg)  01/17/19 157 lb (71.2 kg)   Constitutional: overweight, in NAD Eyes: PERRLA, EOMI, no exophthalmos ENT: moist mucous membranes, no thyromegaly, no  cervical lymphadenopathy Cardiovascular: RRR, No MRG Respiratory: CTA B Gastrointestinal: abdomen soft, NT, ND, BS+ Musculoskeletal: no deformities, strength intact in all 4 Skin: moist, warm, no rashes Neurological: no tremor with outstretched hands, DTR normal in all 4  ASSESSMENT: 1. DM2, non-insulin-dependent, uncontrolled, with complications - PN - DR  She saw nutrition in the past: "I know what I need to do, but I just cannot do it"  We are limited in our options for treatment by the cost of the medicines.  At the previous visit, we called her pharmacy with patient  in the room and almost all the medicines that we could use were more than $100 per month month for her...  2. HL  3. PN  4.  Hypothyroidism  PLAN:  1. Patient with longstanding, uncontrolled, type 2 diabetes, on oral antidiabetic regimen with DPP 4 inhibitor and sulfonylurea.  We did try to stop sulfonylurea (glimepiride 4 mg in a.m.) due to low CBGs before lunch but the sugars increase so we have to restart a low dose.  She is not taking 2 mg twice a day before meals without low blood sugars.  Latest HbA1c was reviewed and this was much higher, at 9.7%.  However, at that time, she was not checking sugars that she did not have a meter (we gave her a One Touch Verio glucometer at last visit) and she was off her medications due to running out of refills.  Since then, we restarted Januvia and added back Amaryl.  I advised her to let me know if the sugars do not improve.  She called [place] with high blood sugars in 11/2018 but I advised her to stop orange juice and milk and her sugars did improve afterwards. -At this visit, sugars are better but she is tells me that her diet is not optimal.  He is eating a lot of noodles with either cheese or beef and we discussed to cut down on the fat but in the meantime advised her to add as many vegetables that she can with these meals.  I do not feel we need to change her regimen otherwise,  states sugars are significantly improved from before. - I advised her to:  Patient Instructions  Please continue: - Januvia 100 mg before b'fast - Amaryl 2 mg 2x a day before meals  Please continue: - Levothyroxine 25 mcg daily  Take the thyroid hormone every day, with water, at least 30 minutes before breakfast, separated by at least 4 hours from: - acid reflux medications - calcium - iron - multivitamins - probiotics  Please return in 4 months with your sugar log.   - we checked her HbA1c: 7.9% (better) - advised to check sugars at different times of the day - 1x a day, rotating check times - advised for yearly eye exams >> she is UTD -We will check an ACR today - return to clinic in 4 months    2. HL - Reviewed latest lipid panel from last year: All fractions at goal Lab Results  Component Value Date   CHOL 142 02/23/2018   HDL 51.70 02/23/2018   LDLCALC 66 02/23/2018   LDLDIRECT 123.0 09/18/2014   TRIG 124.0 02/23/2018   CHOLHDL 3 02/23/2018  - Continues Lipitor 80 and fenofibrate 160 without side effects. - We will recheck this today  3.  Peripheral neuropathy - worse -B12 level was normal -Symptoms improved after cutting back on coffee and PCP also started her on Neurontin 08/2018.  On her medication list, this appears at 100 mg 3 times a day but she is taking it twice a day.  We discussed about maybe doubling the dose of Neurontin at night since this is the time when it hurts her most.  We discussed that this may make her feel dizzy and she has to be careful not to fall.  4. Hypothyroidism - latest thyroid labs reviewed with pt >> slightly elevated at last check Lab Results  Component Value Date   TSH 4.79 (H) 02/23/2018  - she continues on LT4 25 mcg daily -  started 02/2018 - pt feels good on this dose. - we discussed about taking the thyroid hormone every day, with water, >30 minutes before breakfast, separated by >4 hours from acid reflux medications,  calcium, iron, multivitamins. Pt. is taking it incorrectly >> advised her how to take it correctly (see above) - will check thyroid tests at next visit  Component     Latest Ref Rng & Units 03/07/2019  Cholesterol     0 - 200 mg/dL 151  Triglycerides     0.0 - 149.0 mg/dL 192.0 (H)  HDL Cholesterol     >39.00 mg/dL 44.70  VLDL     0.0 - 40.0 mg/dL 38.4  LDL (calc)     0 - 99 mg/dL 68  Total CHOL/HDL Ratio      3  NonHDL      105.90  Microalb, Ur     0.0 - 1.9 mg/dL 1.6  Creatinine,U     mg/dL 153.0  MICROALB/CREAT RATIO     0.0 - 30.0 mg/g 1.0  Labs are normal with the exception of high triglycerides, but this was not a fasting sample.  Philemon Kingdom, MD PhD [facility] Endocrinology

## 2019-03-08 ENCOUNTER — Ambulatory Visit: Payer: Medicare HMO | Admitting: Podiatry

## 2019-03-08 ENCOUNTER — Encounter: Payer: Self-pay | Admitting: Podiatry

## 2019-03-08 VITALS — Temp 98.0°F

## 2019-03-08 DIAGNOSIS — B351 Tinea unguium: Secondary | ICD-10-CM | POA: Diagnosis not present

## 2019-03-08 DIAGNOSIS — L84 Corns and callosities: Secondary | ICD-10-CM

## 2019-03-08 DIAGNOSIS — E1149 Type 2 diabetes mellitus with other diabetic neurological complication: Secondary | ICD-10-CM | POA: Diagnosis not present

## 2019-03-08 DIAGNOSIS — M79675 Pain in left toe(s): Secondary | ICD-10-CM | POA: Diagnosis not present

## 2019-03-08 DIAGNOSIS — M792 Neuralgia and neuritis, unspecified: Secondary | ICD-10-CM | POA: Diagnosis not present

## 2019-03-08 DIAGNOSIS — M79674 Pain in right toe(s): Secondary | ICD-10-CM | POA: Diagnosis not present

## 2019-03-08 LAB — MICROALBUMIN / CREATININE URINE RATIO
Creatinine,U: 153 mg/dL
Microalb Creat Ratio: 1 mg/g (ref 0.0–30.0)
Microalb, Ur: 1.6 mg/dL (ref 0.0–1.9)

## 2019-03-08 MED ORDER — NONFORMULARY OR COMPOUNDED ITEM: 3 refills | Status: DC

## 2019-03-08 NOTE — Progress Notes (Signed)
Subjective: Crystal [name] presents with diabetes and diabetic neuropathy with cc of painful, discolored, thick toenails and painful callus/corn which interfere with activities of daily living. Pain is aggravated when wearing enclosed shoe gear. Pain is relieved with periodic professional debridement.  [name], MD is her PCP. Last visit was 12/02/2018.  Today, patient relates painful callus plantar aspect of the right foot. She states she is on her feet 6 hours/day in her work shoes. She works making biscuits.   She is complaining of increased neuropathic pain: pins and needles sensation in both feet which is becoming more problematic.  She has her diabetic shoes (New Balance sneakers), but does not know where her inserts are.   Current Outpatient Medications:  .  albuterol (PROVENTIL HFA;VENTOLIN HFA) 108 (90 Base) MCG/ACT inhaler, Inhale 2 puffs into the lungs every 6 (six) hours as needed for wheezing or shortness of breath., Disp: 1 Inhaler, Rfl: 2 .  albuterol (VENTOLIN HFA) 108 (90 Base) MCG/ACT inhaler, INHALE 2 PUFFS BY MOUTH INTO THE LUNGS EVERY 4 (FOUR) HOURS AS NEEDED FOR SHORTNESS OF BREATH AND WHEEZING, Disp: 18 g, Rfl: 1 .  Alcohol Swabs (B-D SINGLE USE SWABS REGULAR) PADS, Use as directed to check blood sugar. DX E11.9, Disp: 100 each, Rfl: 3 .  atenolol (TENORMIN) 25 MG tablet, Take 1 tablet (25 mg total) by mouth daily., Disp: 90 tablet, Rfl: 3 .  atorvastatin (LIPITOR) 80 MG tablet, Take 1 tablet (80 mg total) by mouth daily., Disp: 90 tablet, Rfl: 1 .  budesonide-formoterol (SYMBICORT) 80-4.5 MCG/ACT inhaler, Inhale 2 puffs into the lungs 2 (two) times a day., Disp: 1 Inhaler, Rfl: 3 .  cephALEXin (KEFLEX) 500 MG capsule, Take 1 capsule (500 mg total) by mouth 3 (three) times daily. (Patient not taking: Reported on 03/07/2019), Disp: 21 capsule, Rfl: 0 .  cetirizine (ZYRTEC) 10 MG tablet, Take 1 tablet (10 mg total) by mouth daily as needed for allergies., Disp: 30  tablet, Rfl: 11 .  Cholecalciferol (VITAMIN D3) 2000 units TABS, Take 1 tablet by mouth daily., Disp: , Rfl:  .  diclofenac (VOLTAREN) 75 MG EC tablet, Take 1 tablet (75 mg total) by mouth 2 (two) times daily., Disp: 60 tablet, Rfl: 1 .  dicyclomine (BENTYL) 10 MG capsule, TAKE 1 CAPSULE BY MOUTH EVERY 4 TO 6 HOURS AS NEEDED FOR ABDOMINAL PAIN AND CRAMPING OR DIARRHEA, Disp: 60 capsule, Rfl: 1 .  diphenoxylate-atropine (LOMOTIL) 2.5-0.025 MG tablet, Take 1 tablet by mouth every morning. If continued diarrhea in the next 2-3 days may increase to twice daily., Disp: 30 tablet, Rfl: 3 .  donepezil (ARICEPT) 10 MG tablet, Take 1 tablet (10 mg total) by mouth at bedtime., Disp: 30 tablet, Rfl: 0 .  doxycycline (VIBRA-TABS) 100 MG tablet, Take 1 tablet (100 mg total) by mouth 2 (two) times daily. (Patient not taking: Reported on 03/07/2019), Disp: 20 tablet, Rfl: 0 .  escitalopram (LEXAPRO) 20 MG tablet, Take 1 tablet (20 mg total) by mouth daily., Disp: 30 tablet, Rfl: 0 .  fenofibrate 160 MG tablet, TAKE 1 TABLET (160 MG TOTAL) BY MOUTH DAILY., Disp: 90 tablet, Rfl: 2 .  fluticasone (FLOVENT HFA) 110 MCG/ACT inhaler, Inhale 1-2 puffs into the lungs 2 (two) times daily., Disp: 1 Inhaler, Rfl: 3 .  gabapentin (NEURONTIN) 100 MG capsule, Take 1 capsule (100 mg total) by mouth 3 (three) times daily., Disp: 90 capsule, Rfl: 3 .  glimepiride (AMARYL) 2 MG tablet, Take 2 tablets (4 mg total)  by mouth 2 (two) times daily., Disp: 90 tablet, Rfl: 3 .  glucose blood (ONETOUCH VERIO) test strip, Use as instructed to check blood sugar 2 times a day, Disp: 150 each, Rfl: 12 .  hydrochlorothiazide (HYDRODIURIL) 25 MG tablet, Take 1 tablet (25 mg total) by mouth daily., Disp: 30 tablet, Rfl: 3 .  ketorolac (ACULAR) 0.5 % ophthalmic solution, Place 1 drop into the right eye 4 (four) times daily. (Patient taking differently: Place 1 drop into both eyes 4 (four) times daily. ), Disp: 10 mL, Rfl: 0 .  Lancets Ultra Fine MISC,  Use to check blood sugar two times a day, Disp: 200 each, Rfl: 12 .  levothyroxine (SYNTHROID) 25 MCG tablet, Take 1 tablet (25 mcg total) by mouth daily before breakfast., Disp: 90 tablet, Rfl: 1 .  losartan (COZAAR) 50 MG tablet, Take 1 tablet (50 mg total) by mouth daily., Disp: 90 tablet, Rfl: 3 .  montelukast (SINGULAIR) 10 MG tablet, Take 1 tablet (10 mg total) by mouth at bedtime., Disp: 30 tablet, Rfl: 6 .  Multiple Vitamins-Minerals (CENTRUM SILVER PO), Take 1 tablet by mouth daily.  , Disp: , Rfl:  .  pantoprazole (PROTONIX) 40 MG tablet, Take 1 tablet (40 mg total) by mouth daily., Disp: 90 tablet, Rfl: 3 .  prednisoLONE acetate (PRED FORTE) 1 % ophthalmic suspension, Place 1 drop into the right eye 4 (four) times daily., Disp: 10 mL, Rfl: 0 .  Probiotic Product (ALIGN) 4 MG CAPS, Take 1 capsule by mouth daily. Reported on 07/02/2015, Disp: , Rfl:  .  sitaGLIPtin (JANUVIA) 100 MG tablet, Take 1 tablet (100 mg total) by mouth daily., Disp: 90 tablet, Rfl: 3 .  triamcinolone cream (KENALOG) 0.1 %, Apply 1 application topically 2 (two) times daily. , Disp: , Rfl: 0 .  valACYclovir (VALTREX) 1000 MG tablet, Take 1 tablet (1,000 mg total) by mouth 3 (three) times daily., Disp: 21 tablet, Rfl: 0  Allergies  Allergen Reactions  . Compazine [Prochlorperazine Edisylate]     Comma for 3 days  . Sulfa Antibiotics Itching    severe itching  . Tetanus Toxoid Other (See Comments)    convulsions    Objective: Vitals:   03/08/19 1411  Temp: 98 F (36.7 C)    Vascular Examination: Capillary refill time <3 seconds x 10 digits.  Dorsalis pedis pulses palpable b/l.  Posterior tibial pulses palpable b/l.  Digital hair present x 10 digits.  Skin temperature gradient WNL b/l.  Dermatological Examination: Skin with normal turgor, texture and tone b/l.  Toenails 1-5 b/l discolored, thick, dystrophic with subungual debris and pain with palpation to nailbeds due to thickness of  nails.  Hyperkeratotic lesion(s) plantar right foot. No erythema, no edema, no drainage, no flocculence noted.   Musculoskeletal: Muscle strength 5/5 to all LE muscle groups.  No pain, crepitus or joint limitation with passive/active ROM.  Neurological: Sensation diminished with 10 gram monofilament.  Vibratory sensation diminished  Assessment: 1. Painful onychomycosis toenails 1-5 b/l 2. Callus plantar right foot 3. Increased neuropathic pain b/l feet 4. NIDDM with Diabetic neuropathy  Plan: 1. Continue diabetic foot care principles. Literature dispensed on today. 2. Toenails 1-5 b/l were debrided in length and girth without iatrogenic bleeding. Discussed neuropathic symptoms and she would like to try topical prescrption. A prescription was sent to [facility] for peripheral neuropathy cream which consists of: Bupivacaine 1%, doxepin 3%, gabapentin 6%, pentoxifylline 3%, and Topimarate 1%. Apply 1-2 grams to feet 3-4 times daily as  needed for foot pain. 3. Callus pared plantar right foot utilizing sterile scalpel blade without incident. 4. We replaced her diabetic insoles on today's visit as a courtesy. Patient to continue soft, supportive shoe gear daily. 5. Patient to report any pedal injuries to medical professional  6. Follow up 10 weeks.  7. Patient/POA to call should there be a concern in the interim.

## 2019-03-08 NOTE — Patient Instructions (Signed)
Diabetes Mellitus and Foot Care Foot care is an important part of your health, especially when you have diabetes. Diabetes may cause you to have problems because of poor blood flow (circulation) to your feet and legs, which can cause your skin to:  Become thinner and drier.  Break more easily.  Heal more slowly.  Peel and crack. You may also have nerve damage (neuropathy) in your legs and feet, causing decreased feeling in them. This means that you may not notice minor injuries to your feet that could lead to more serious problems. Noticing and addressing any potential problems early is the best way to prevent future foot problems. How to care for your feet Foot hygiene  Wash your feet daily with warm water and mild soap. Do not use hot water. Then, pat your feet and the areas between your toes until they are completely dry. Do not soak your feet as this can dry your skin.  Trim your toenails straight across. Do not dig under them or around the cuticle. File the edges of your nails with an emery board or nail file.  Apply a moisturizing lotion or petroleum jelly to the skin on your feet and to dry, brittle toenails. Use lotion that does not contain alcohol and is unscented. Do not apply lotion between your toes. Shoes and socks  Wear clean socks or stockings every day. Make sure they are not too tight. Do not wear knee-high stockings since they may decrease blood flow to your legs.  Wear shoes that fit properly and have enough cushioning. Always look in your shoes before you put them on to be sure there are no objects inside.  To break in new shoes, wear them for just a few hours a day. This prevents injuries on your feet. Wounds, scrapes, corns, and calluses  Check your feet daily for blisters, cuts, bruises, sores, and redness. If you cannot see the bottom of your feet, use a mirror or ask someone for help.  Do not cut corns or calluses or try to remove them with medicine.  If you  find a minor scrape, cut, or break in the skin on your feet, keep it and the skin around it clean and dry. You may clean these areas with mild soap and water. Do not clean the area with peroxide, alcohol, or iodine.  If you have a wound, scrape, corn, or callus on your foot, look at it several times a day to make sure it is healing and not infected. Check for: ? Redness, swelling, or pain. ? Fluid or blood. ? Warmth. ? Pus or a bad smell. General instructions  Do not cross your legs. This may decrease blood flow to your feet.  Do not use heating pads or hot water bottles on your feet. They may burn your skin. If you have lost feeling in your feet or legs, you may not know this is happening until it is too late.  Protect your feet from hot and cold by wearing shoes, such as at the beach or on hot pavement.  Schedule a complete foot exam at least once a year (annually) or more often if you have foot problems. If you have foot problems, report any cuts, sores, or bruises to your health care provider immediately. Contact a health care provider if:  You have a medical condition that increases your risk of infection and you have any cuts, sores, or bruises on your feet.  You have an injury that is not   healing.  You have redness on your legs or feet.  You feel burning or tingling in your legs or feet.  You have pain or cramps in your legs and feet.  Your legs or feet are numb.  Your feet always feel cold.  You have pain around a toenail. Get help right away if:  You have a wound, scrape, corn, or callus on your foot and: ? You have pain, swelling, or redness that gets worse. ? You have fluid or blood coming from the wound, scrape, corn, or callus. ? Your wound, scrape, corn, or callus feels warm to the touch. ? You have pus or a bad smell coming from the wound, scrape, corn, or callus. ? You have a fever. ? You have a red line going up your leg. Summary  Check your feet every day  for cuts, sores, red spots, swelling, and blisters.  Moisturize feet and legs daily.  Wear shoes that fit properly and have enough cushioning.  If you have foot problems, report any cuts, sores, or bruises to your health care provider immediately.  Schedule a complete foot exam at least once a year (annually) or more often if you have foot problems. This information is not intended to replace advice given to you by your health care provider. Make sure you discuss any questions you have with your health care provider. Document Released: 06/27/2000 Document Revised: 08/12/2017 Document Reviewed: 08/01/2016 [employer]vier Patient Education  2020 [employer]   Onychomycosis/Fungal Toenails  WHAT IS IT? An infection that lies within the keratin of your nail plate that is caused by a fungus.  WHY ME? Fungal infections affect all ages, sexes, races, and creeds.  There may be many factors that predispose you to a fungal infection such as age, coexisting medical conditions such as diabetes, or an autoimmune disease; stress, medications, fatigue, genetics, etc.  Bottom line: fungus thrives in a warm, moist environment and your shoes offer such a location.  IS IT CONTAGIOUS? Theoretically, yes.  You do not want to share shoes, nail clippers or files with someone who has fungal toenails.  Walking around barefoot in the same room or sleeping in the same bed is unlikely to transfer the organism.  It is important to realize, however, that fungus can spread easily from one nail to the next on the same foot.  HOW DO WE TREAT THIS?  There are several ways to treat this condition.  Treatment may depend on many factors such as age, medications, pregnancy, liver and kidney conditions, etc.  It is best to ask your doctor which options are available to you.  1. No treatment.   Unlike many other medical concerns, you can live with this condition.  However for many people this can be a painful condition and may lead to  ingrown toenails or a bacterial infection.  It is recommended that you keep the nails cut short to help reduce the amount of fungal nail. 2. Topical treatment.  These range from herbal remedies to prescription strength nail lacquers.  About 40-50% effective, topicals require twice daily application for approximately 9 to 12 months or until an entirely new nail has grown out.  The most effective topicals are medical grade medications available through physicians offices. 3. Oral antifungal medications.  With an 80-90% cure rate, the most common oral medication requires 3 to 4 months of therapy and stays in your system for a year as the new nail grows out.  Oral antifungal medications do require   blood work to make sure it is a safe drug for you.  A liver function panel will be performed prior to starting the medication and after the first month of treatment.  It is important to have the blood work performed to avoid any harmful side effects.  In general, this medication safe but blood work is required. 4. Laser Therapy.  This treatment is performed by applying a specialized laser to the affected nail plate.  This therapy is noninvasive, fast, and non-painful.  It is not covered by insurance and is therefore, out of pocket.  The results have been very good with a 80-95% cure rate.  [facility] is the only practice in the area to offer this therapy. 5. Permanent Nail Avulsion.  Removing the entire nail so that a new nail will not grow back. 

## 2019-03-08 NOTE — Addendum Note (Signed)
Addended by: Wardell Heath on: 03/08/2019 03:50 PM   Modules accepted: Orders

## 2019-03-10 ENCOUNTER — Ambulatory Visit: Payer: Medicare HMO | Admitting: Podiatry

## 2019-04-11 ENCOUNTER — Other Ambulatory Visit: Payer: Self-pay | Admitting: Family Medicine

## 2019-04-29 ENCOUNTER — Telehealth: Payer: Self-pay

## 2019-04-29 ENCOUNTER — Telehealth: Payer: Self-pay | Admitting: Family Medicine

## 2019-04-29 NOTE — Telephone Encounter (Signed)
Copied from [place] [phone] [phone]. Topic: General - Inquiry >> Apr 29, 2019  2:24 PM Richardo Priest, NT wrote: Reason for CRM: Patient called in stating she would like to speak with PCP or nurse in regards to rash she has that she would not like to discuss with agent. Please advise.

## 2019-04-29 NOTE — Telephone Encounter (Signed)
Medication Refill - Medication:  valACYclovir (VALTREX) 1000 MG tablet  Has the patient contacted their pharmacy? Yes advised to call office.   Preferred Pharmacy (with phone number or street name):  [facility], [place] [phone] (Phone) [phone] (Fax)   Agent: Please be advised that RX refills may take up to 3 business days. We ask that you follow-up with your pharmacy.

## 2019-05-02 MED ORDER — VALACYCLOVIR HCL 1 G PO TABS: 1000.0000 mg | ORAL_TABLET | Freq: Three times a day (TID) | ORAL | 0 refills | Status: DC

## 2019-05-02 MED FILL — valACYclovir HCL 1 GM TABS: 1 | 7 days supply | Qty: 21 | Fill #0

## 2019-05-02 NOTE — Telephone Encounter (Signed)
Medication sent in. 

## 2019-05-02 NOTE — Telephone Encounter (Signed)
Called patient left message for patient to call the office back, need to know more about the rash  Nurse triage may handle

## 2019-05-11 NOTE — Progress Notes (Signed)
[facility] Note  05/13/2019     CHIEF COMPLAINT Patient presents for Retina Follow Up   HISTORY OF PRESENT ILLNESS: Crystal [name] is a 68 y.o. female who presents to the clinic today for:   HPI    Retina Follow Up    Patient presents with  Other.  In right eye.  This started weeks ago.  Severity is moderate.  Duration of weeks.  Since onset it is stable.  I, the attending physician,  performed the HPI with the patient and updated documentation appropriately.          Comments    BS:147 A1c:7.9 Patient states her vision is stable in both eyes.  Patient denies eye pain or discomfort and denies any new or worsening floaters or fol OU.        Last edited by Bernarda Caffey, MD on 05/14/2019  1:12 AM. (History)    pt states she is doing well, she states she has no visual complaints other than persistent floaters  Referring physician: Calvert Cantor, MD [address],  [place] [address]  HISTORICAL INFORMATION:   Selected notes from the MEDICAL RECORD NUMBER Referral from Dr. Bing Plume for retina eval: - last exam by [facility], 03/17/17 - 2 wk history of floaters OU - hx of DM2 since 2003 - pseudophakia OU (complex CEIOL OD, 12.14.16, OS: 11.28.16)   CURRENT MEDICATIONS: Current Outpatient Medications (Ophthalmic Drugs)  Medication Sig  . ketorolac (ACULAR) 0.5 % ophthalmic solution Place 1 drop into the right eye 4 (four) times daily. (Patient taking differently: Place 1 drop into both eyes 4 (four) times daily. )  . prednisoLONE acetate (PRED FORTE) 1 % ophthalmic suspension Place 1 drop into the right eye 4 (four) times daily.   No current facility-administered medications for this visit.  (Ophthalmic Drugs)   Current Outpatient Medications (Other)  Medication Sig  . albuterol (PROVENTIL HFA;VENTOLIN HFA) 108 (90 Base) MCG/ACT inhaler Inhale 2 puffs into the lungs every 6 (six) hours as needed for wheezing or shortness of  breath.  Marland Kitchen albuterol (VENTOLIN HFA) 108 (90 Base) MCG/ACT inhaler INHALE 2 PUFFS BY MOUTH INTO THE LUNGS EVERY 4 (FOUR) HOURS AS NEEDED FOR SHORTNESS OF BREATH AND WHEEZING  . Alcohol Swabs (B-D SINGLE USE SWABS REGULAR) PADS Use as directed to check blood sugar. DX E11.9  . atenolol (TENORMIN) 25 MG tablet Take 1 tablet (25 mg total) by mouth daily.  Marland Kitchen atorvastatin (LIPITOR) 80 MG tablet Take 1 tablet (80 mg total) by mouth daily.  . budesonide-formoterol (SYMBICORT) 80-4.5 MCG/ACT inhaler Inhale 2 puffs into the lungs 2 (two) times a day.  . cephALEXin (KEFLEX) 500 MG capsule Take 1 capsule (500 mg total) by mouth 3 (three) times daily. (Patient not taking: Reported on 03/07/2019)  . cetirizine (ZYRTEC) 10 MG tablet Take 1 tablet (10 mg total) by mouth daily as needed for allergies.  . Cholecalciferol (VITAMIN D3) 2000 units TABS Take 1 tablet by mouth daily.  . diclofenac (VOLTAREN) 75 MG EC tablet Take 1 tablet (75 mg total) by mouth 2 (two) times daily.  Marland Kitchen dicyclomine (BENTYL) 10 MG capsule TAKE 1 CAPSULE BY MOUTH EVERY 4 TO 6 HOURS AS NEEDED FOR ABDOMINAL PAIN AND CRAMPING OR DIARRHEA  . diphenoxylate-atropine (LOMOTIL) 2.5-0.025 MG tablet Take 1 tablet by mouth every morning. If continued diarrhea in the next 2-3 days may increase to twice daily.  Marland Kitchen donepezil (ARICEPT) 10 MG tablet Take 1  tablet (10 mg total) by mouth at bedtime.  Marland Kitchen doxycycline (VIBRA-TABS) 100 MG tablet Take 1 tablet (100 mg total) by mouth 2 (two) times daily. (Patient not taking: Reported on 03/07/2019)  . escitalopram (LEXAPRO) 20 MG tablet Take 1 tablet (20 mg total) by mouth daily.  . fenofibrate 160 MG tablet TAKE 1 TABLET (160 MG TOTAL) BY MOUTH DAILY.  . fluticasone (FLOVENT HFA) 110 MCG/ACT inhaler Inhale 1-2 puffs into the lungs 2 (two) times daily.  Marland Kitchen gabapentin (NEURONTIN) 100 MG capsule Take 1 capsule (100 mg total) by mouth 3 (three) times daily.  Marland Kitchen glimepiride (AMARYL) 2 MG tablet Take 2 tablets (4 mg total) by  mouth 2 (two) times daily.  Marland Kitchen glimepiride (AMARYL) 4 MG tablet TAKE 1 TABLET TWICE DAILY  . glucose blood (ONETOUCH VERIO) test strip Use as instructed to check blood sugar 2 times a day  . hydrochlorothiazide (HYDRODIURIL) 25 MG tablet Take 1 tablet (25 mg total) by mouth daily.  . Lancets Ultra Fine MISC Use to check blood sugar two times a day  . levothyroxine (SYNTHROID) 25 MCG tablet Take 1 tablet (25 mcg total) by mouth daily before breakfast.  . losartan (COZAAR) 50 MG tablet Take 1 tablet (50 mg total) by mouth daily.  . montelukast (SINGULAIR) 10 MG tablet Take 1 tablet (10 mg total) by mouth at bedtime.  . Multiple Vitamins-Minerals (CENTRUM SILVER PO) Take 1 tablet by mouth daily.    . NONFORMULARY OR COMPOUNDED ITEM Peripheral Neuropathy Cream: Bupivacaine 1%, Doxepin 3%, Gabapentin 6%, Pentoxifylline 3%, Topiramate 1% Order faxed to [facility]  . pantoprazole (PROTONIX) 40 MG tablet Take 1 tablet (40 mg total) by mouth daily.  . Probiotic Product (ALIGN) 4 MG CAPS Take 1 capsule by mouth daily. Reported on 07/02/2015  . sitaGLIPtin (JANUVIA) 100 MG tablet Take 1 tablet (100 mg total) by mouth daily.  Marland Kitchen triamcinolone cream (KENALOG) 0.1 % Apply 1 application topically 2 (two) times daily.   . valACYclovir (VALTREX) 1000 MG tablet Take 1 tablet (1,000 mg total) by mouth 3 (three) times daily.   No current facility-administered medications for this visit.  (Other)      REVIEW OF SYSTEMS: ROS    Positive for: Gastrointestinal, Musculoskeletal, Eyes, Respiratory, Psychiatric   Negative for: Constitutional, Neurological, Skin, Genitourinary, HENT, Endocrine, Cardiovascular, Allergic/Imm, Heme/Lymph   Last edited by Doneen Poisson on 05/13/2019  1:17 PM. (History)       ALLERGIES Allergies  Allergen Reactions  . Compazine [Prochlorperazine Edisylate]     Comma for 3 days  . Sulfa Antibiotics Itching    severe itching  . Tetanus Toxoid Other (See Comments)     convulsions    PAST MEDICAL HISTORY Past Medical History:  Diagnosis Date  . Acute peptic ulcer of stomach    As a teenager  . Allergy    dust, cock roach,grass,weeds,molds  . Anxiety   . Arthritis    knees, hands, shoulders  . Asthma    02/08/10 FEV1 1.98 (80%), s/p saba 2.22 l/m (90%).nml  . Broken ankle    right foot, wearing a boot  . Cognitive change 06/10/2016  . Dementia ([place])   . Depression   . Diabetes mellitus 2003   Type II  . Diverticulosis   . Dyspepsia   . Fatty liver 12/19/2010  . GERD (gastroesophageal reflux disease)   . Hearing loss    bilateral hearing aids   . Herpes simplex virus (HSV) infection   . High cholesterol   .  Hyperlipemia   . Hypertension   . Hypothyroidism 09/08/2013  . IBS (irritable bowel syndrome)   . Insomnia   . Internal hemorrhoids   . Joint pain 08/08/2016  . Low back pain 08/10/2016  . OSA (obstructive sleep apnea)    Does not use CPAP - old one broken, new sleep study to be scheduled and then given a new CPAP machine  . Plantar fasciitis    no longer an issue  . Sun-damaged skin 12/09/2016  . SVD (spontaneous vaginal delivery)    x 4  . Vaginal Pap smear, abnormal   . Vitamin D deficiency 03/04/2016  . Wears partial dentures    upper dentures and lower partial   Past Surgical History:  Procedure Laterality Date  . APPENDECTOMY     age 40  . CATARACT EXTRACTION Bilateral 2016   Dr. Bing Plume  . CATARACT EXTRACTION, BILATERAL     12/8 and 12/14  . COLONOSCOPY  06/03/2010, 08/25/2011    severeal - polyps and diverticulosis   . DILATATION & CURETTAGE/HYSTEROSCOPY WITH MYOSURE N/A 04/01/2018   Procedure: DILATATION & CURETTAGE/HYSTEROSCOPY WITH MYOSURE POLYPECTOMY;  Surgeon: Emily Filbert, MD;  Location: [facility];  Service: Gynecology;  Laterality: N/A;  . EYE SURGERY     cateracts removed bilateral  . GASTRECTOMY     partial - ulcer as a teen  . HAND SURGERY Right    thumb   . HEMORRHOID BANDING    . KNEE ARTHROSCOPY Left 1998   . NASAL SEPTUM SURGERY    . RHINOPLASTY    . TOE SURGERY Right 1995    FAMILY HISTORY Family History  Problem Relation Age of Onset  . Diabetes Mother        type II  . Heart attack Mother 81  . Stroke Mother   . Hyperlipidemia Mother   . Colon cancer Sister   . Cancer Sister        GI  . Heart disease Sister   . Asthma Maternal Grandmother   . Mental illness Son        PTSD from [employer]  . Parkinsonism Father   . Cancer Sister        stage 4 bladder cancer  . Colon polyps Sister   . Ulcers Other        bleeding gastric  . Heart defect Other        child  . Other Other        perforated bowel, child  . Cancer Brother        pancreatic cancer  . Amblyopia Neg Hx   . Blindness Neg Hx   . Cataracts Neg Hx   . Glaucoma Neg Hx   . Macular degeneration Neg Hx   . Retinal detachment Neg Hx   . Strabismus Neg Hx   . Retinitis pigmentosa Neg Hx     SOCIAL HISTORY Social History   Tobacco Use  . Smoking status: Never Smoker  . Smokeless tobacco: Never Used  Substance Use Topics  . Alcohol use: Yes    Alcohol/week: 0.0 standard drinks    Comment: Rare occasions   . Drug use: No         OPHTHALMIC EXAM:  Base Eye Exam    Visual Acuity (Snellen - Linear)      Right Left   Dist cc 20/40 -2 20/25 -1   Dist ph cc 20/30 -1 20/20   Correction: Glasses       Tonometry (Tonopen, 1:23  PM)      Right Left   Pressure 21 18       Pupils      Dark Light Shape React APD   Right 3 2 Irregular Minimal 0   Left 3 2 Round Brisk 0       Visual Fields      Left Right    Full Full       Extraocular Movement      Right Left    Full Full       Neuro/Psych    Oriented x3: Yes   Mood/Affect: Normal       Dilation    Both eyes: 1.0% Mydriacyl, 2.5% Phenylephrine @ 1:23 PM        Slit Lamp and Fundus Exam    External Exam      Right Left   External Normal Normal       Slit Lamp Exam      Right Left   Lids/Lashes Dermatochalasis - upper lid   Dermatochalasis - upper lid temporally, Meibomian gland dysfunction   Conjunctiva/Sclera White and quiet White and quiet   Cornea 1+ Punctate epithelial erosions, mild endo pigment, well healed temporal cataract wound with residual nylon suture at 0900 Well healed cataract wounds, Debris in tear film, trace Punctate epithelial erosions   Anterior Chamber Deep and clear, mild vitreous prolapse temporally Deep and quiet   Iris Round and dilated, TID at temporal pupil margin Round and dilated; PPM at 0600    Lens PC IOL in good position with open PC, mild vitreous prolapse around temporal lens edge Posterior chamber intraocular lens - good position   Vitreous Vitreous syneresis Vitreous syneresis; Posterior vitreous detachment       Fundus Exam      Right Left   Disc Pink and sharp, temporal PPP +cupping, sharp rim, mild tilt, PPP   C/D Ratio 0.6 0.7   Macula Flat, Blunted foveal reflex, Epiretinal membrane, RPE mottling, no CME, few IRH inferior macula Flat, good foveal reflex, Retinal pigment epithelial mottling, No heme or edema   Vessels Vascular attenuation, Tortuous, AV crossing changes Vascular attenuation - mild   Periphery flat; attached; trace RPE changes; hair line Retinal tears at 3 and 4 o'clock w/ good laser scars surrounding -- basically ora; peripheral cystoid degeneration; supratemporal cobblestoning  Flat; attached; trace RPE changes, peripheral cystoid degneration        Refraction    Wearing Rx      Sphere Cylinder Axis Add   Right -1.25 +0.75 088 +2.50   Left -1.25 +0.75 110 +2.50   Type: PAL       Manifest Refraction      Sphere Cylinder Axis Dist VA   Right -1.25 +1.25 090 20/30-2   Left -1.25 +1.25 110 20/20-2          IMAGING AND PROCEDURES  Imaging and Procedures for 04/30/17  OCT, Retina - OU - Both Eyes       Right Eye Quality was good. Central Foveal Thickness: 360. Progression has been stable. Findings include epiretinal membrane, no IRF, no SRF,  normal foveal contour (Trace cystic changes).   Left Eye Quality was good. Central Foveal Thickness: 278. Progression has been stable. Findings include normal foveal contour, no IRF, no SRF.   Notes Images taken, stored on drive   Diagnosis / Impression:  OD - NFP; +ERM (stable from prior); trace cystic changes OS - NFP; no IRF/SRF  Clinical management:  See below  Abbreviations: NFP - Normal foveal profile. CME - cystoid macular edema. PED - pigment epithelial detachment. IRF - intraretinal fluid. SRF - subretinal fluid. EZ - ellipsoid zone. ERM - epiretinal membrane. ORA - outer retinal atrophy. ORT - outer retinal tubulation. SRHM - subretinal hyper-reflective material                  ASSESSMENT/PLAN:    ICD-10-CM   1. 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 Posterior vitreous detachment of both eyes  H43.813   3. Type 2 diabetes mellitus without complication, without long-term current use of insulin (HCC)  E11.9   4. Pseudophakia of both eyes  Z96.1   5. CME (cystoid macular edema), right  H35.351   6. Retinal edema  H35.81 OCT, Retina - OU - Both Eyes  7. Epiretinal membrane (ERM) of right eye  H35.371     1. Peripheral retinal tears OD  - Small flap tears at ora, 0300 and 0400 oclock  - s/p barrier laser retinopexy OD, 03/18/17 -- good laser in place  - stable; No new RT/RD  2. PVD / vitreous syneresis OU  - Discussed findings and prognosis  - No RT or RD on 360 scleral depressed exam OS  - Reviewed s/s of RT/RD  - Strict return precautions for any such RT/RD signs/symptoms  3. DM2 without retinopathy  - On oral meds  - Discussed importance of tight BG and BP control  4. Pseudophakia OU  - IOLs in good position OU -- beautiful surgeries by Dr. Bing Plume  - OD with history of anterior vitrectomy and 3 piece sulcus IOL  - monitor  5,6. CME OD  - Remains resolved now since 12.20.19 -- was on PF and ketorolac  - Etiology could be  multifactorial -- ERM, delayed post-cataract  - completed PF taper   - monitor  7. Epiretinal membrane, right eye   - Mild ERM OD  - May have contributed to CME OD (#5)  - Not yet surgical -- monitor for pucker / symptoms  - F/u 12 mos     Ophthalmic Meds Ordered this visit:  No orders of the defined types were placed in this encounter.      Return in about 1 year (around 05/12/2020) for f/u ERM OD, DFE, OCT.  There are no Patient Instructions on file for this visit.   Explained the diagnoses, plan, and follow up with the patient and they expressed understanding.  Patient expressed understanding of the importance of proper follow up care.   This document serves as a record of services personally performed by Gardiner Sleeper, MD, PhD. It was created on their behalf by Ernest Mallick, OA, an ophthalmic assistant. The creation of this record is the provider's dictation and/or activities during the visit.    Electronically signed by: Ernest Mallick, OA 10.28.2020 1:14 AM   Gardiner Sleeper, M.D., Ph.D. Diseases & Surgery of the Retina and Vitreous [facility]  I have reviewed the above documentation for accuracy and completeness, and I agree with the above. Gardiner Sleeper, M.D., Ph.D. 05/14/19 1:14 AM    Abbreviations: M myopia (nearsighted); A astigmatism; H hyperopia (farsighted); P presbyopia; Mrx spectacle prescription;  CTL contact lenses; OD right eye; OS left eye; OU both eyes  XT exotropia; ET esotropia; PEK punctate epithelial keratitis; PEE punctate epithelial erosions; DES dry eye syndrome; MGD meibomian gland dysfunction; ATs artificial tears; PFAT's preservative free artificial tears; [place]  nuclear sclerotic cataract; PSC posterior subcapsular cataract; ERM epi-retinal membrane; PVD posterior vitreous detachment; RD retinal detachment; DM diabetes mellitus; DR diabetic retinopathy; NPDR non-proliferative diabetic retinopathy; PDR proliferative diabetic  retinopathy; CSME clinically significant macular edema; DME diabetic macular edema; dbh dot blot hemorrhages; CWS cotton wool spot; POAG primary open angle glaucoma; C/D cup-to-disc ratio; HVF humphrey visual field; GVF goldmann visual field; OCT optical coherence tomography; IOP intraocular pressure; BRVO Branch retinal vein occlusion; CRVO central retinal vein occlusion; CRAO central retinal artery occlusion; BRAO branch retinal artery occlusion; RT retinal tear; SB scleral buckle; PPV pars plana vitrectomy; VH Vitreous hemorrhage; PRP panretinal laser photocoagulation; IVK intravitreal kenalog; VMT vitreomacular traction; MH Macular hole;  NVD neovascularization of the disc; NVE neovascularization elsewhere; AREDS age related eye disease study; ARMD age related macular degeneration; POAG primary open angle glaucoma; EBMD epithelial/anterior basement membrane dystrophy; ACIOL anterior chamber intraocular lens; IOL intraocular lens; PCIOL posterior chamber intraocular lens; Phaco/IOL phacoemulsification with intraocular lens placement; [place] photorefractive keratectomy; LASIK laser assisted in situ keratomileusis; HTN hypertension; DM diabetes mellitus; COPD chronic obstructive pulmonary disease

## 2019-05-13 ENCOUNTER — Ambulatory Visit (INDEPENDENT_AMBULATORY_CARE_PROVIDER_SITE_OTHER): Payer: Medicare HMO | Admitting: Ophthalmology

## 2019-05-13 DIAGNOSIS — H43813 Vitreous degeneration, bilateral: Secondary | ICD-10-CM | POA: Diagnosis not present

## 2019-05-13 DIAGNOSIS — H35351 Cystoid macular degeneration, right eye: Secondary | ICD-10-CM

## 2019-05-13 DIAGNOSIS — H33331 Multiple defects of retina without detachment, right eye: Secondary | ICD-10-CM | POA: Diagnosis not present

## 2019-05-13 DIAGNOSIS — Z961 Presence of intraocular lens: Secondary | ICD-10-CM

## 2019-05-13 DIAGNOSIS — E119 Type 2 diabetes mellitus without complications: Secondary | ICD-10-CM | POA: Diagnosis not present

## 2019-05-13 DIAGNOSIS — H35371 Puckering of macula, right eye: Secondary | ICD-10-CM

## 2019-05-13 DIAGNOSIS — H3581 Retinal edema: Secondary | ICD-10-CM | POA: Diagnosis not present

## 2019-05-14 ENCOUNTER — Encounter (INDEPENDENT_AMBULATORY_CARE_PROVIDER_SITE_OTHER): Payer: Self-pay | Admitting: Ophthalmology

## 2019-05-16 ENCOUNTER — Ambulatory Visit: Payer: Medicare HMO | Admitting: Podiatry

## 2019-05-16 ENCOUNTER — Other Ambulatory Visit: Payer: Self-pay | Admitting: Family Medicine

## 2019-05-24 ENCOUNTER — Ambulatory Visit: Payer: Medicare HMO | Admitting: Podiatry

## 2019-05-25 DIAGNOSIS — I10 Essential (primary) hypertension: Secondary | ICD-10-CM | POA: Diagnosis not present

## 2019-05-25 DIAGNOSIS — Z79899 Other long term (current) drug therapy: Secondary | ICD-10-CM | POA: Diagnosis not present

## 2019-05-25 DIAGNOSIS — Z888 Allergy status to other drugs, medicaments and biological substances status: Secondary | ICD-10-CM | POA: Diagnosis not present

## 2019-05-25 DIAGNOSIS — E119 Type 2 diabetes mellitus without complications: Secondary | ICD-10-CM | POA: Diagnosis not present

## 2019-05-25 DIAGNOSIS — R9431 Abnormal electrocardiogram [ECG] [EKG]: Secondary | ICD-10-CM | POA: Diagnosis not present

## 2019-05-25 DIAGNOSIS — Z887 Allergy status to serum and vaccine status: Secondary | ICD-10-CM | POA: Diagnosis not present

## 2019-05-25 DIAGNOSIS — E785 Hyperlipidemia, unspecified: Secondary | ICD-10-CM | POA: Diagnosis not present

## 2019-05-25 DIAGNOSIS — N390 Urinary tract infection, site not specified: Secondary | ICD-10-CM | POA: Diagnosis not present

## 2019-05-25 DIAGNOSIS — R112 Nausea with vomiting, unspecified: Secondary | ICD-10-CM | POA: Diagnosis not present

## 2019-05-27 ENCOUNTER — Ambulatory Visit: Payer: Medicare HMO | Admitting: Internal Medicine

## 2019-06-07 ENCOUNTER — Encounter: Payer: Self-pay | Admitting: Internal Medicine

## 2019-06-07 ENCOUNTER — Telehealth: Payer: Self-pay | Admitting: Internal Medicine

## 2019-06-07 NOTE — Telephone Encounter (Signed)
Advice Only  Hughie Closs, RN routed conversation to You 2 hours ago (10:20 AM)    Hughie Closs, RN 2 hours ago (10:20 AM)     Patient called and states she thinks her IBS has come back. For the last 6 weeks she has been having intermittent diarrhea and constant nausea. She is weak. And has been sent home from work several times. No fever. Took Lomotil 2 tabs, and then was constipated for 4 days. She is back to diarrhea. 4 episodes this morning, total liquid and brown/yellow in color. She took lomotil 1 tab 2 hours ago and has not had diarrhea since then. Please advise     I copied the above after I closed prior note  This patient needs to be evaluated  Above copied from [place] phone note  She needs to be evaluated

## 2019-06-07 NOTE — Telephone Encounter (Signed)
This encounter was created in error - please disregard.

## 2019-06-07 NOTE — Telephone Encounter (Signed)
Called patient and gave Dr. Celesta Aver recommendations. Scheduled office visit with Tye Savoy NP on 06/16/19

## 2019-06-07 NOTE — Telephone Encounter (Signed)
Patient called and states she thinks her IBS has come back. For the last 6 weeks she has been having intermittent diarrhea and constant nausea. She is weak. And has been sent home from work several times. No fever. Took Lomotil 2 tabs, and then was constipated for 4 days. She is back to diarrhea. 4 episodes this morning, total liquid and brown/yellow in color. She took lomotil 1 tab 2 hours ago and has not had diarrhea since then. Please advise

## 2019-06-07 NOTE — Telephone Encounter (Signed)
  She can try the 1 lomotil as needed but if it constipates her too much try Imodium AD as needed  Stay hydrated  BRAT diet to start then advance to low fiver over time   Would try to get her an appointment with Tye Savoy (saw her in past) next available

## 2019-06-16 ENCOUNTER — Other Ambulatory Visit: Payer: Medicare HMO

## 2019-06-16 ENCOUNTER — Ambulatory Visit: Payer: Medicare HMO | Admitting: Nurse Practitioner

## 2019-06-16 ENCOUNTER — Encounter: Payer: Self-pay | Admitting: Nurse Practitioner

## 2019-06-16 VITALS — BP 130/72 | HR 89 | Temp 98.4°F | Ht 62.0 in | Wt 151.0 lb

## 2019-06-16 DIAGNOSIS — R197 Diarrhea, unspecified: Secondary | ICD-10-CM | POA: Diagnosis not present

## 2019-06-16 NOTE — Progress Notes (Signed)
Chief Complaint:   Sick of having diarrhea   IMPRESSION and PLAN:    68.  68 year old female with longstanding diarrhea predominant IBS.  She had done well for several months off Lomotil but now with severe diarrhea over the last few weeks.  Diarrhea worse after taking Cipro for UTI.  Rule out IBS flare versus infectious etiology --Check stool for C. Difficile --If C. difficile is negative then will refill Lomotil which has helped her in the past --If diarrhea persists, will need further work-up   HPI:     Patient is a 68 year old female known to Dr. Carlean Purl.  She has a longstanding history of diarrhea predominant IBS.  For several months she had normal bow she is having several loose bowel el movements off Lomotil.  Few weeks ago she developed recurrent diarrhea.  Seen in the [facility] ED 05/25/2019 for evaluation of nausea.  Urinalysis suggested UTI, she was treated with Cipro.  She did not have any urinary symptoms at the time.  After treatment for UTI the nausea and vomiting resolved.  She does feel like the diarrhea got much worse after taking Cipro.  She is having several loose bowel movements a day, no nocturnal stooling.  No fever.  No blood in stool.  None associated abdominal pain   Data Reviewed:  05/25/2019 CBC, liver test, lipase okay.  Review of systems:     No chest pain, no SOB, no fevers, no urinary sx   Past Medical History:  Diagnosis Date  . Acute peptic ulcer of stomach    As a teenager  . Allergy    dust, cock roach,grass,weeds,molds  . Anxiety   . Arthritis    knees, hands, shoulders  . Asthma    02/08/10 FEV1 1.98 (80%), s/p saba 2.22 l/m (90%).nml  . Broken ankle    right foot, wearing a boot  . Cognitive change 06/10/2016  . Dementia ([place])   . Depression   . Diabetes mellitus 2003   Type II  . Diverticulosis   . Dyspepsia   . Fatty liver 12/19/2010  . GERD (gastroesophageal reflux disease)   . Hearing loss    bilateral hearing aids   .  Herpes simplex virus (HSV) infection   . Hyperlipemia   . Hypertension   . Hypothyroidism 09/08/2013  . IBS (irritable bowel syndrome)   . Insomnia   . Internal hemorrhoids   . Low back pain 08/10/2016  . OSA (obstructive sleep apnea)    Does not use CPAP - old one broken, new sleep study to be scheduled and then given a new CPAP machine  . Plantar fasciitis    no longer an issue  . Sun-damaged skin 12/09/2016  . SVD (spontaneous vaginal delivery)    x 4  . Vaginal Pap smear, abnormal   . Vitamin D deficiency 03/04/2016  . Wears partial dentures    upper dentures and lower partial    Patient's surgical history, family medical history, social history, medications and allergies were all reviewed in Epic    Current Outpatient Medications  Medication Sig Dispense Refill  . albuterol (PROVENTIL HFA;VENTOLIN HFA) 108 (90 Base) MCG/ACT inhaler Inhale 2 puffs into the lungs every 6 (six) hours as needed for wheezing or shortness of breath. 1 Inhaler 2  . albuterol (VENTOLIN HFA) 108 (90 Base) MCG/ACT inhaler INHALE 2 PUFFS BY MOUTH INTO THE LUNGS EVERY 4 (FOUR) HOURS AS NEEDED FOR SHORTNESS OF BREATH AND WHEEZING 18 g 1  .  Alcohol Swabs (B-D SINGLE USE SWABS REGULAR) PADS Use as directed to check blood sugar. DX E11.9 100 each 3  . atenolol (TENORMIN) 25 MG tablet Take 1 tablet (25 mg total) by mouth daily. 90 tablet 3  . atorvastatin (LIPITOR) 80 MG tablet Take 1 tablet (80 mg total) by mouth daily. 90 tablet 1  . budesonide-formoterol (SYMBICORT) 80-4.5 MCG/ACT inhaler Inhale 2 puffs into the lungs 2 (two) times a day. 1 Inhaler 3  . cetirizine (ZYRTEC) 10 MG tablet Take 1 tablet (10 mg total) by mouth daily as needed for allergies. 30 tablet 11  . Cholecalciferol (VITAMIN D3) 2000 units TABS Take 1 tablet by mouth daily.    . diclofenac (VOLTAREN) 75 MG EC tablet Take 1 tablet (75 mg total) by mouth 2 (two) times daily. 60 tablet 1  . dicyclomine (BENTYL) 10 MG capsule TAKE 1 CAPSULE BY  MOUTH EVERY 4 TO 6 HOURS AS NEEDED FOR ABDOMINAL PAIN AND CRAMPING OR DIARRHEA 60 capsule 1  . donepezil (ARICEPT) 10 MG tablet Take 1 tablet (10 mg total) by mouth at bedtime. 30 tablet 0  . escitalopram (LEXAPRO) 20 MG tablet Take 1 tablet (20 mg total) by mouth daily. 30 tablet 0  . fenofibrate 160 MG tablet TAKE 1 TABLET (160 MG TOTAL) DAILY. 90 tablet 2  . fluticasone (FLOVENT HFA) 110 MCG/ACT inhaler Inhale 1-2 puffs into the lungs 2 (two) times daily. 1 Inhaler 3  . glimepiride (AMARYL) 4 MG tablet TAKE 1 TABLET TWICE DAILY 180 tablet 3  . glucose blood (ONETOUCH VERIO) test strip Use as instructed to check blood sugar 2 times a day 150 each 12  . Lancets Ultra Fine MISC Use to check blood sugar two times a day 200 each 12  . levothyroxine (SYNTHROID) 25 MCG tablet Take 1 tablet (25 mcg total) by mouth daily before breakfast. 90 tablet 1  . losartan (COZAAR) 50 MG tablet Take 1 tablet (50 mg total) by mouth daily. 90 tablet 3  . montelukast (SINGULAIR) 10 MG tablet Take 1 tablet (10 mg total) by mouth at bedtime. 30 tablet 6  . Multiple Vitamins-Minerals (CENTRUM SILVER PO) Take 1 tablet by mouth daily.      . pantoprazole (PROTONIX) 40 MG tablet Take 1 tablet (40 mg total) by mouth daily. 90 tablet 3  . Probiotic Product (ALIGN) 4 MG CAPS Take 1 capsule by mouth daily. Reported on 07/02/2015    . sitaGLIPtin (JANUVIA) 100 MG tablet Take 1 tablet (100 mg total) by mouth daily. 90 tablet 3  . diphenoxylate-atropine (LOMOTIL) 2.5-0.025 MG tablet Take 1 tablet by mouth every morning. If continued diarrhea in the next 2-3 days may increase to twice daily. (Patient not taking: Reported on 06/16/2019) 30 tablet 3  . gabapentin (NEURONTIN) 100 MG capsule Take 1 capsule (100 mg total) by mouth 3 (three) times daily. (Patient not taking: Reported on 06/16/2019) 90 capsule 3  . hydrochlorothiazide (HYDRODIURIL) 25 MG tablet Take 1 tablet (25 mg total) by mouth daily. (Patient not taking: Reported on  06/16/2019) 30 tablet 3  . valACYclovir (VALTREX) 1000 MG tablet Take 1 tablet (1,000 mg total) by mouth 3 (three) times daily. (Patient not taking: Reported on 06/16/2019) 21 tablet 0   No current facility-administered medications for this visit.     Physical Exam:     BP 130/72   Pulse 89   Temp 98.4 F (36.9 C)   Ht 5\' 2"  (1.575 m)   Wt 151 lb (68.5 kg)  LMP  (LMP Unknown)   BMI 27.62 kg/m   GENERAL:  Pleasant female in NAD PSYCH: : Cooperative, normal affect EENT:  conjunctiva pink, mucous membranes moist, neck supple without masses CARDIAC:  RRR, no murmur heard, no peripheral edema PULM: Normal respiratory effort, lungs CTA bilaterally, no wheezing ABDOMEN:  Nondistended, soft, nontender. No obvious masses, no hepatomegaly,  normal bowel sounds SKIN:  turgor, no lesions seen Musculoskeletal:  Normal muscle tone, normal strength NEURO: Alert and oriented x 3, no focal neurologic deficits   Tye Savoy , NP 06/16/2019, 2:53 PM [

## 2019-06-16 NOTE — Patient Instructions (Signed)
If you are age 70 or older, your body mass index should be between 23-30. Your Body mass index is 27.62 kg/m. If this is out of the aforementioned range listed, please consider follow up with your Primary Care Provider.  If you are age 92 or younger, your body mass index should be between 19-25. Your Body mass index is 27.62 kg/m. If this is out of the aformentioned range listed, please consider follow up with your Primary Care Provider.   Your provider has requested that you go to the basement level for lab work before leaving today. Press "B" on the elevator. The lab is located at the first door on the left as you exit the elevator. C Diff  We will call you with results.  Thank you for choosing me and [facility].   Tye Savoy, NP

## 2019-06-17 ENCOUNTER — Other Ambulatory Visit: Payer: Medicare HMO

## 2019-06-17 DIAGNOSIS — R197 Diarrhea, unspecified: Secondary | ICD-10-CM

## 2019-06-17 LAB — C.DIFFICILE TOXIN: C. Difficile Toxin A: DETECTED — AB

## 2019-06-20 ENCOUNTER — Other Ambulatory Visit: Payer: Self-pay

## 2019-06-20 ENCOUNTER — Telehealth: Payer: Self-pay | Admitting: Nurse Practitioner

## 2019-06-20 MED ORDER — VANCOMYCIN HCL 125 MG PO CAPS: 125.0000 mg | ORAL_CAPSULE | Freq: Four times a day (QID) | ORAL | 0 refills | Status: DC

## 2019-06-20 MED ORDER — VANCOMYCIN HCL 125 MG PO CAPS: 125.0000 mg | ORAL_CAPSULE | Freq: Four times a day (QID) | ORAL | 0 refills | Status: AC

## 2019-06-20 NOTE — Telephone Encounter (Signed)
Needs her work note faxed to [phone]

## 2019-06-20 NOTE — Telephone Encounter (Signed)
I have spoken with the patient. The work note has been faxed as she requests.

## 2019-06-22 ENCOUNTER — Telehealth: Payer: Self-pay | Admitting: Nurse Practitioner

## 2019-06-22 ENCOUNTER — Telehealth: Payer: Self-pay | Admitting: *Deleted

## 2019-06-22 DIAGNOSIS — E039 Hypothyroidism, unspecified: Secondary | ICD-10-CM

## 2019-06-22 NOTE — Telephone Encounter (Signed)
Spoke with the patient. She had vomited last night. No vomiting today. She has anti-nausea medication which she will take if this happens again. States "lots of running off" meaning she is experiencing diarrhea. No appetite. Discussed in detail self care, emphasizing hydration. Suggested Pedialyte, popcicles, jello, broth soup, and sports drinks. She can take Imodium per direction to give control over any incontinence of feces.  Patient will call and ask for [facility] with any further concerns or issues.

## 2019-06-22 NOTE — Telephone Encounter (Signed)
Received fax from [facility] wanting to know if ok to substitute a different brand of levothyroxine as they have a new supplier.  Please advise?

## 2019-06-22 NOTE — Telephone Encounter (Signed)
Yes they can substitute a new brand of Levothyroxine but she will need her tsh checked in 8-12 weeks to make sure she is tolerating it well.

## 2019-06-22 NOTE — Telephone Encounter (Signed)
Per patient request this information is emailed to her employer at [employer]

## 2019-06-23 ENCOUNTER — Other Ambulatory Visit: Payer: Self-pay | Admitting: Family Medicine

## 2019-06-23 NOTE — Telephone Encounter (Signed)
Thanks [employer]. Hopefully she is able to keep down the Vancomycin.

## 2019-06-23 NOTE — Telephone Encounter (Signed)
Notified Elmyra Ricks at [facility] that it is ok to dispense different brand. Sent mychart message to pt.

## 2019-06-24 ENCOUNTER — Telehealth: Payer: Self-pay | Admitting: Nurse Practitioner

## 2019-06-24 DIAGNOSIS — R109 Unspecified abdominal pain: Secondary | ICD-10-CM | POA: Diagnosis not present

## 2019-06-24 DIAGNOSIS — E785 Hyperlipidemia, unspecified: Secondary | ICD-10-CM | POA: Diagnosis not present

## 2019-06-24 DIAGNOSIS — R112 Nausea with vomiting, unspecified: Secondary | ICD-10-CM | POA: Diagnosis not present

## 2019-06-24 DIAGNOSIS — E119 Type 2 diabetes mellitus without complications: Secondary | ICD-10-CM | POA: Diagnosis not present

## 2019-06-24 DIAGNOSIS — Z79899 Other long term (current) drug therapy: Secondary | ICD-10-CM | POA: Diagnosis not present

## 2019-06-24 DIAGNOSIS — R5383 Other fatigue: Secondary | ICD-10-CM | POA: Diagnosis not present

## 2019-06-24 DIAGNOSIS — N281 Cyst of kidney, acquired: Secondary | ICD-10-CM | POA: Diagnosis not present

## 2019-06-24 DIAGNOSIS — K5730 Diverticulosis of large intestine without perforation or abscess without bleeding: Secondary | ICD-10-CM | POA: Diagnosis not present

## 2019-06-24 DIAGNOSIS — I10 Essential (primary) hypertension: Secondary | ICD-10-CM | POA: Diagnosis not present

## 2019-06-24 DIAGNOSIS — Z888 Allergy status to other drugs, medicaments and biological substances status: Secondary | ICD-10-CM | POA: Diagnosis not present

## 2019-06-24 DIAGNOSIS — Z887 Allergy status to serum and vaccine status: Secondary | ICD-10-CM | POA: Diagnosis not present

## 2019-06-24 NOTE — Telephone Encounter (Signed)
Pt called asking to speak with you asap. She stated that she feels very sick today and is considering going to the ED. Pls call her.

## 2019-06-24 NOTE — Telephone Encounter (Signed)
Okay, sounds like she is not tolerating the oral vancomycin. Thanks for update.

## 2019-06-24 NOTE — Telephone Encounter (Signed)
FYI-you can acknowledge and send this back to me  Called the patient. She is en route to the ED close to her. States her boyfriend is driving her. States "I hurt and I am so sick." This started last night and she complains that she is feeling worse. The hospital closest to her is [facility]. Agrees to call us with an update later.

## 2019-07-04 ENCOUNTER — Other Ambulatory Visit: Payer: Self-pay | Admitting: Family Medicine

## 2019-07-04 MED ORDER — VALACYCLOVIR HCL 1 G PO TABS: 1000.0000 mg | ORAL_TABLET | Freq: Three times a day (TID) | ORAL | 0 refills | Status: DC

## 2019-07-04 MED ORDER — VALACYCLOVIR HCL 1 G PO TABS: 1000.0000 mg | ORAL_TABLET | Freq: Three times a day (TID) | ORAL | 0 refills | Status: AC

## 2019-07-06 ENCOUNTER — Telehealth: Payer: Self-pay | Admitting: Nurse Practitioner

## 2019-07-06 DIAGNOSIS — N939 Abnormal uterine and vaginal bleeding, unspecified: Secondary | ICD-10-CM | POA: Diagnosis not present

## 2019-07-06 DIAGNOSIS — E1149 Type 2 diabetes mellitus with other diabetic neurological complication: Secondary | ICD-10-CM | POA: Diagnosis not present

## 2019-07-06 DIAGNOSIS — N390 Urinary tract infection, site not specified: Secondary | ICD-10-CM | POA: Diagnosis not present

## 2019-07-06 DIAGNOSIS — J301 Allergic rhinitis due to pollen: Secondary | ICD-10-CM | POA: Diagnosis not present

## 2019-07-06 DIAGNOSIS — R5382 Chronic fatigue, unspecified: Secondary | ICD-10-CM | POA: Diagnosis not present

## 2019-07-06 DIAGNOSIS — I10 Essential (primary) hypertension: Secondary | ICD-10-CM | POA: Diagnosis not present

## 2019-07-06 DIAGNOSIS — A0472 Enterocolitis due to Clostridium difficile, not specified as recurrent: Secondary | ICD-10-CM | POA: Diagnosis not present

## 2019-07-06 DIAGNOSIS — E782 Mixed hyperlipidemia: Secondary | ICD-10-CM | POA: Diagnosis not present

## 2019-07-06 DIAGNOSIS — E039 Hypothyroidism, unspecified: Secondary | ICD-10-CM | POA: Diagnosis not present

## 2019-07-06 DIAGNOSIS — K219 Gastro-esophageal reflux disease without esophagitis: Secondary | ICD-10-CM | POA: Diagnosis not present

## 2019-07-07 NOTE — Telephone Encounter (Signed)
Left message for patient to call back to the office;  

## 2019-07-13 NOTE — Telephone Encounter (Signed)
Called and spoke with patient-patient reports she is feeling better and no longer needs advice on symptoms; Patient advised to call back to the office at [phone] should questions/concerns arise;  Patient verbalized understanding of information/instructions;

## 2019-08-11 DIAGNOSIS — E1149 Type 2 diabetes mellitus with other diabetic neurological complication: Secondary | ICD-10-CM | POA: Diagnosis not present

## 2019-08-11 DIAGNOSIS — Z1231 Encounter for screening mammogram for malignant neoplasm of breast: Secondary | ICD-10-CM | POA: Diagnosis not present

## 2019-08-11 DIAGNOSIS — I10 Essential (primary) hypertension: Secondary | ICD-10-CM | POA: Diagnosis not present

## 2019-08-11 DIAGNOSIS — F0390 Unspecified dementia without behavioral disturbance: Secondary | ICD-10-CM | POA: Diagnosis not present

## 2019-08-11 DIAGNOSIS — N950 Postmenopausal bleeding: Secondary | ICD-10-CM | POA: Diagnosis not present

## 2019-09-26 DIAGNOSIS — Z1231 Encounter for screening mammogram for malignant neoplasm of breast: Secondary | ICD-10-CM | POA: Diagnosis not present

## 2019-09-30 ENCOUNTER — Other Ambulatory Visit: Payer: Self-pay | Admitting: Family Medicine

## 2019-11-08 ENCOUNTER — Other Ambulatory Visit: Payer: Self-pay | Admitting: Pulmonary Disease

## 2019-11-09 DIAGNOSIS — E1149 Type 2 diabetes mellitus with other diabetic neurological complication: Secondary | ICD-10-CM | POA: Diagnosis not present

## 2019-11-09 DIAGNOSIS — J301 Allergic rhinitis due to pollen: Secondary | ICD-10-CM | POA: Diagnosis not present

## 2019-11-09 DIAGNOSIS — R413 Other amnesia: Secondary | ICD-10-CM | POA: Diagnosis not present

## 2019-11-09 DIAGNOSIS — E039 Hypothyroidism, unspecified: Secondary | ICD-10-CM | POA: Diagnosis not present

## 2019-11-09 DIAGNOSIS — K219 Gastro-esophageal reflux disease without esophagitis: Secondary | ICD-10-CM | POA: Diagnosis not present

## 2019-11-09 DIAGNOSIS — E782 Mixed hyperlipidemia: Secondary | ICD-10-CM | POA: Diagnosis not present

## 2019-11-09 DIAGNOSIS — I10 Essential (primary) hypertension: Secondary | ICD-10-CM | POA: Diagnosis not present

## 2019-11-15 DIAGNOSIS — J301 Allergic rhinitis due to pollen: Secondary | ICD-10-CM | POA: Insufficient documentation

## 2019-11-22 IMAGING — DX DG CERVICAL SPINE COMPLETE 4+V
6 series · 6 of 6 positions shown · non-contrast
Comparison: None.

CLINICAL DATA: Posterior left neck pain for a few months, no known
injury, initial encounter

EXAM:
CERVICAL SPINE - COMPLETE 4+ VIEW

[c-spine lat]
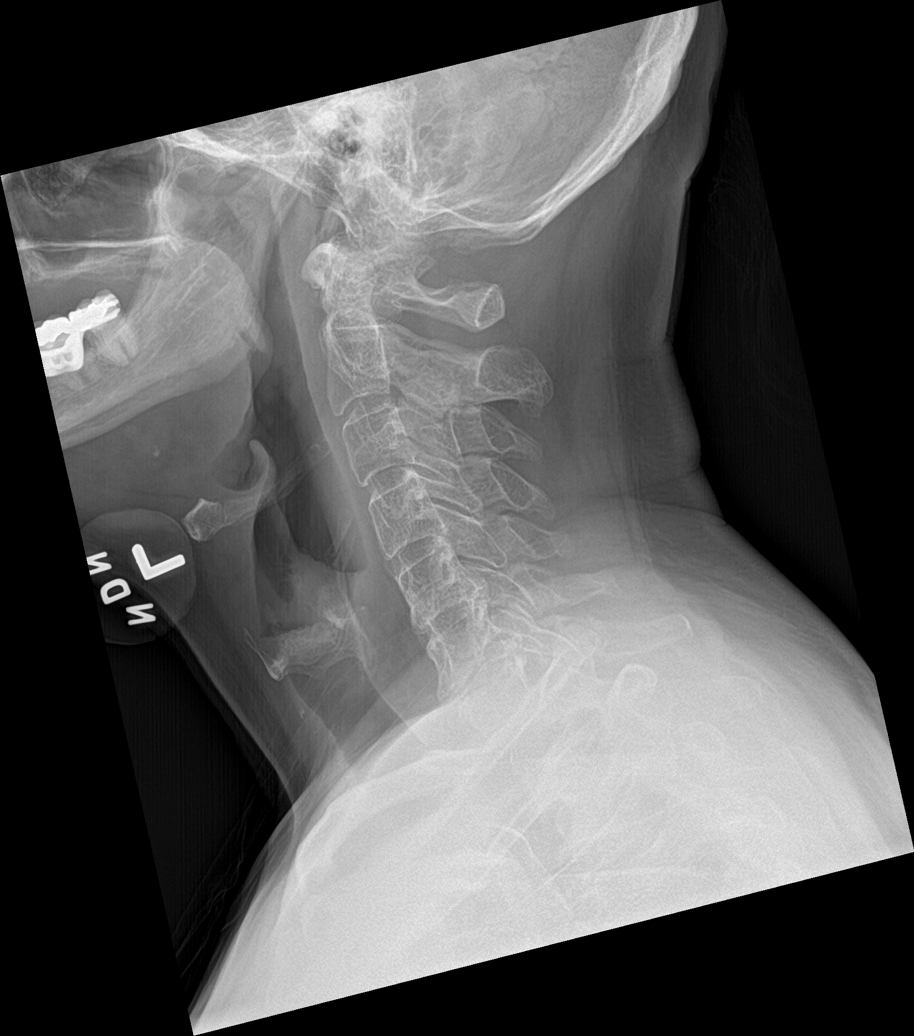

[c-spine obl (1 of 2)]
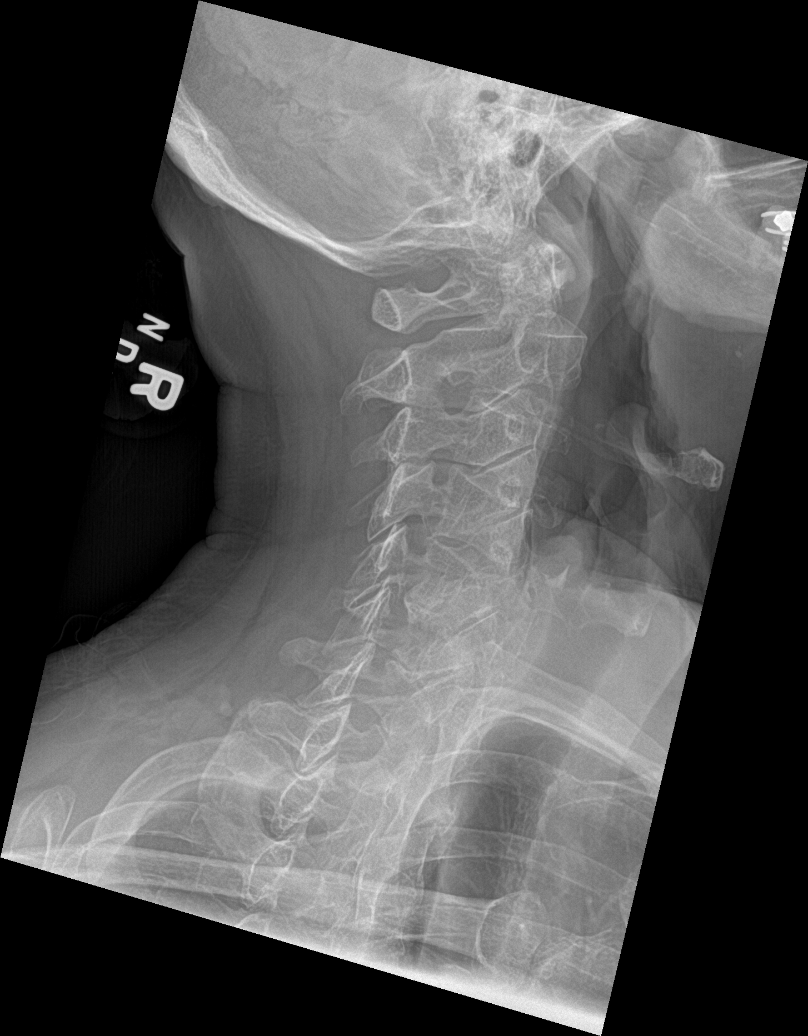

[c-spine obl (2 of 2)]
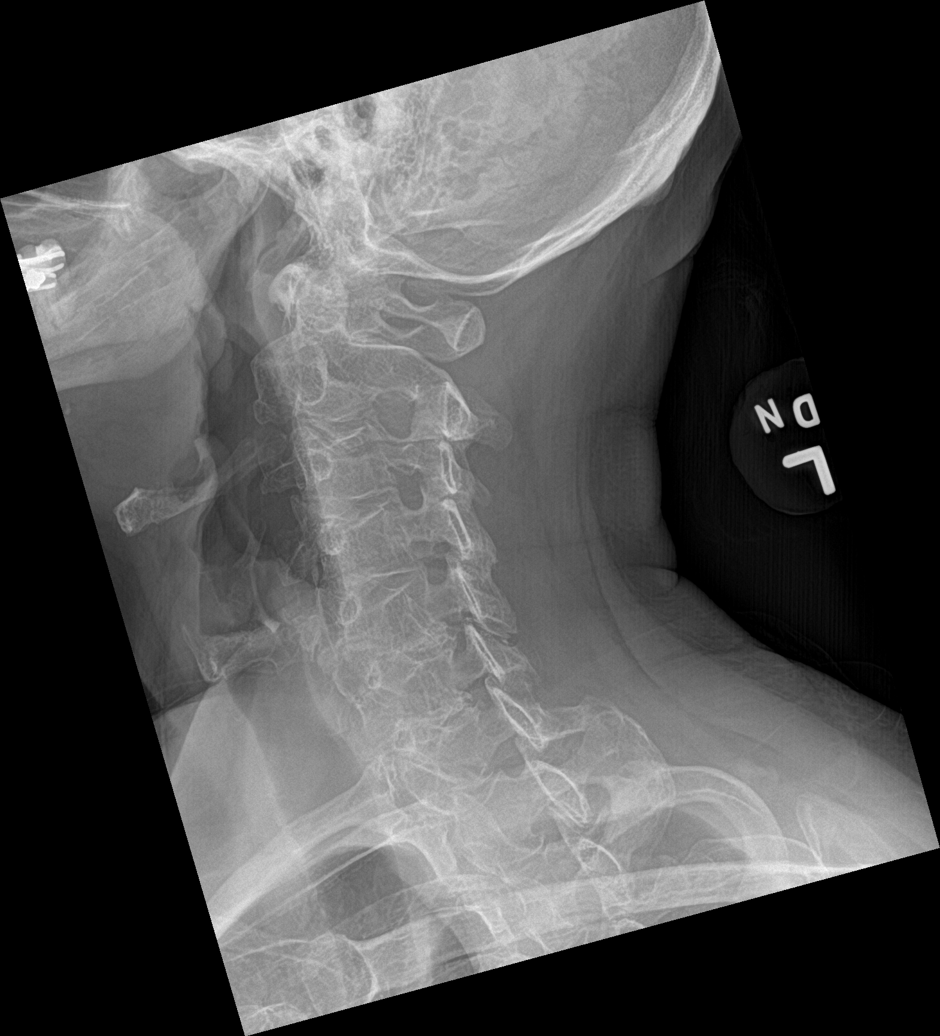

[c-spine ap]
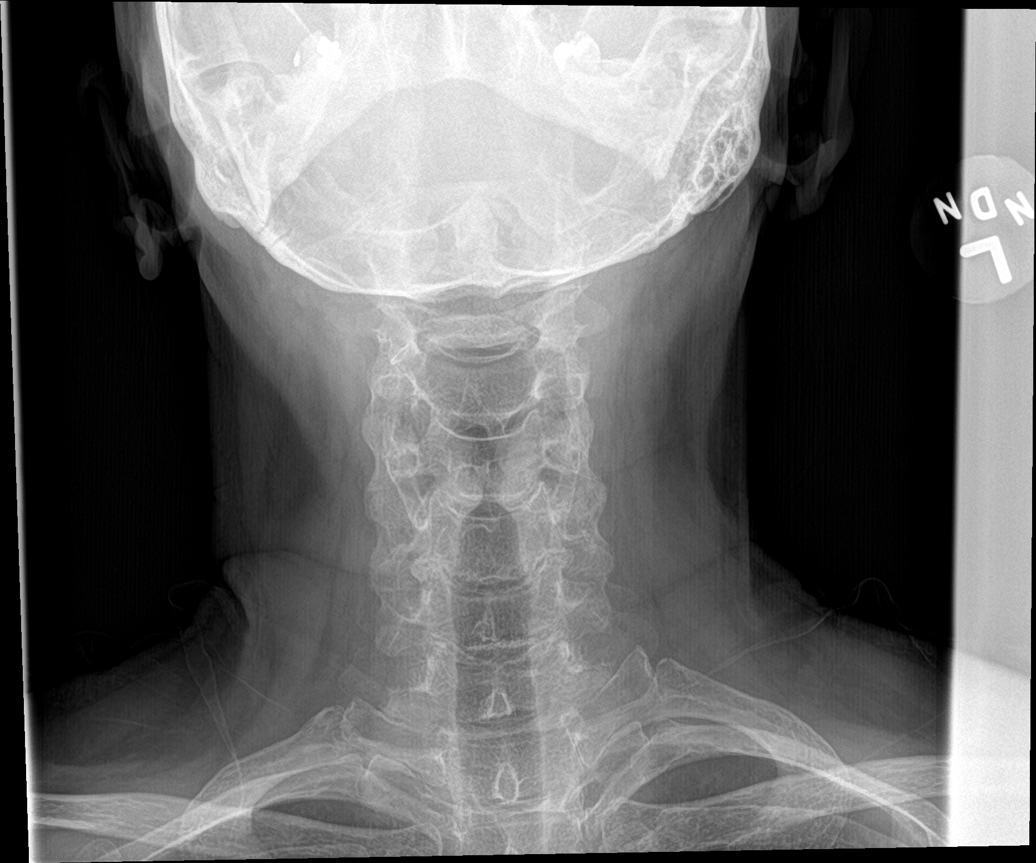

[c-spine open mouth]
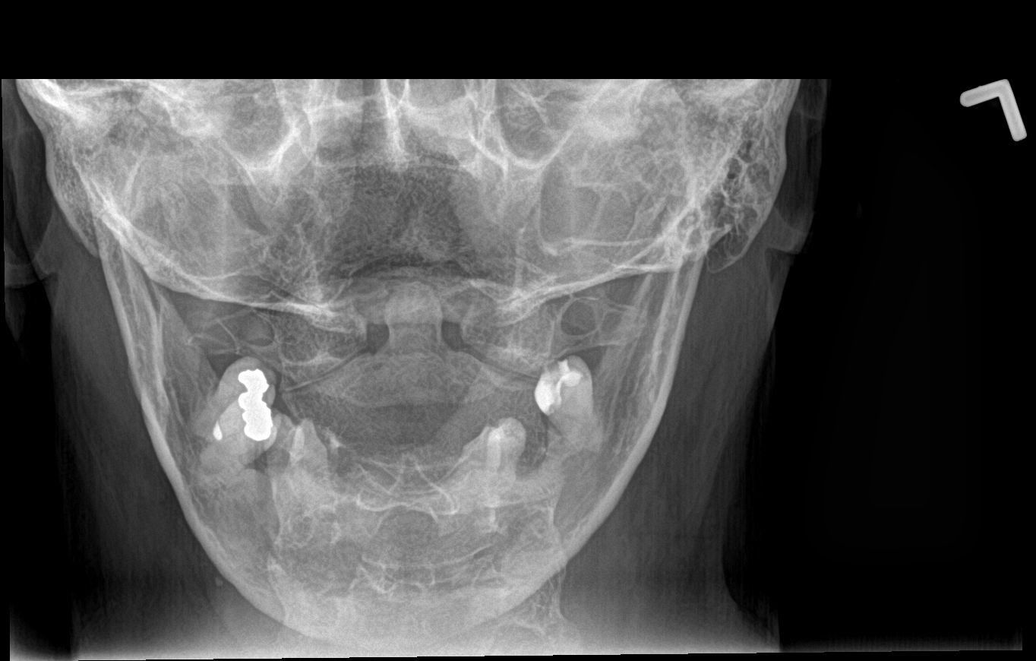

[c-spine swimmers]
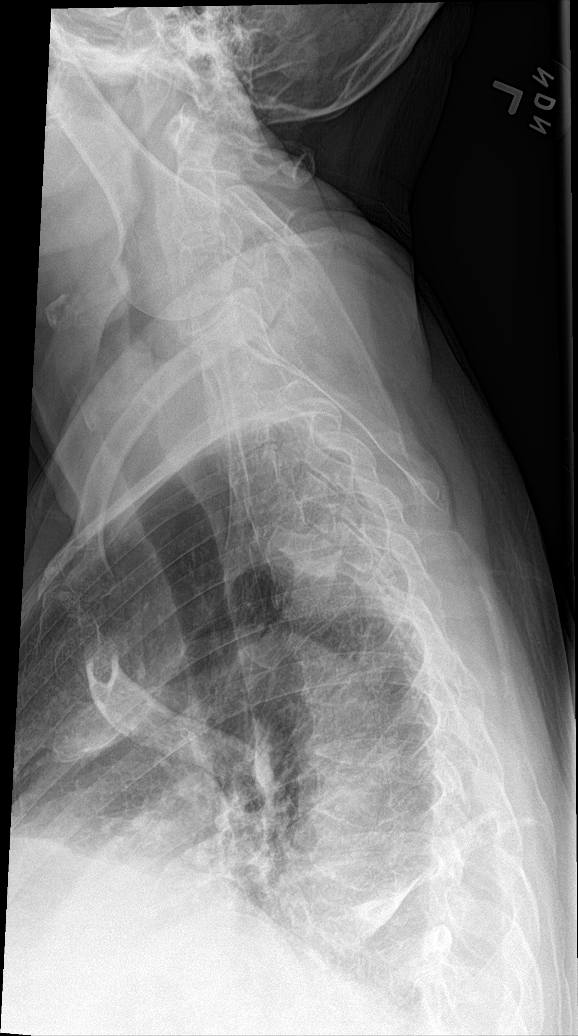

[6 of 6 positions shown; findings below may reference images not displayed]

FINDINGS: Seven cervical segments are well visualized. Vertebral body height
is well maintained. Mild osteophytic changes are noted at C5-6 and
C6-7. Mild neural foraminal narrowing is noted bilaterally at C4-5,
C5-6 and C6-7. No acute fracture or acute facet abnormality is
noted. The odontoid is within normal limits. No soft tissue changes
are noted.
IMPRESSION: Degenerative change without acute abnormality.

## 2020-01-07 ENCOUNTER — Encounter: Payer: Self-pay | Admitting: Emergency Medicine

## 2020-01-07 ENCOUNTER — Emergency Department
Admission: EM | Admit: 2020-01-07 | Discharge: 2020-01-07 | Disposition: A | Discharge status: Home / Self Care | Payer: Medicare HMO | Source: Home / Self Care

## 2020-01-07 ENCOUNTER — Other Ambulatory Visit: Payer: Self-pay

## 2020-01-07 DIAGNOSIS — L304 Erythema intertrigo: Secondary | ICD-10-CM

## 2020-01-07 MED ORDER — CLOTRIMAZOLE 1 % EX CREA: TOPICAL_CREAM | CUTANEOUS | 0 refills | Status: AC

## 2020-01-07 MED ORDER — CEPHALEXIN 500 MG PO CAPS: 500.0000 mg | ORAL_CAPSULE | Freq: Two times a day (BID) | ORAL | 0 refills | Status: AC

## 2020-01-07 NOTE — Discharge Instructions (Signed)
  Please take antibiotics as prescribed and be sure to complete entire course even if you start to feel better to ensure infection does not come back.  Also use the antifungal cream as prescribed to help with a skin yeast infection. Try to keep the area clean and dry. Change clothes as soon as possible if working outside and sweating.  Follow up with family medicine or a dermatologist if not improving in 1 week, sooner if worsening.

## 2020-01-07 NOTE — ED Triage Notes (Signed)
Rash under breasts - pt had shingles a year ago & thinks this may be the same thing  No OTC meds Rash is raised- pt states is burns and is sore  Pt first noticed rash Wed night or Thurs am Denies no new detergents or bras COVID Vaccine May Pfizer

## 2020-01-07 NOTE — ED Provider Notes (Signed)
[name]    CSN: [id number] Arrival date & time: 01/07/20  1005      History   Chief Complaint Chief Complaint  Patient presents with  . Rash    HPI Crystal [name] is a 69 y.o. female.   HPI  Crystal [name] is a 69 y.o. female presenting to UC with c/o itching burning rash under both breasts that started 3 days ago. Hx of shingles, she is concerned she has shingles again.  Denies new soaps, lotions or detergents. She has not tried any home treatments. No fever, chills, n/v/d.     Past Medical History:  Diagnosis Date  . Acute peptic ulcer of stomach    As a teenager  . Allergy    dust, cock roach,grass,weeds,molds  . Anxiety   . Arthritis    knees, hands, shoulders  . Asthma    02/08/10 FEV1 1.98 (80%), s/p saba 2.22 l/m (90%).nml  . Broken ankle    right foot, wearing a boot  . Cognitive change 06/10/2016  . Dementia ([place])   . Depression   . Diabetes mellitus 2003   Type II  . Diverticulosis   . Dyspepsia   . Fatty liver 12/19/2010  . GERD (gastroesophageal reflux disease)   . Hearing loss    bilateral hearing aids   . Herpes simplex virus (HSV) infection   . High cholesterol   . Hyperlipemia   . Hypertension   . Hypothyroidism 09/08/2013  . IBS (irritable bowel syndrome)   . Insomnia   . Internal hemorrhoids   . Joint pain 08/08/2016  . Low back pain 08/10/2016  . OSA (obstructive sleep apnea)    Does not use CPAP - old one broken, new sleep study to be scheduled and then given a new CPAP machine  . Plantar fasciitis    no longer an issue  . Sun-damaged skin 12/09/2016  . SVD (spontaneous vaginal delivery)    x 4  . Vaginal Pap smear, abnormal   . Vitamin D deficiency 03/04/2016  . Wears partial dentures    upper dentures and lower partial    Patient Active Problem List   Diagnosis Date Noted  . Left thyroid nodule 12/06/2018  . Costochondral chest pain 06/01/2018  . Postmenopausal bleeding 03/20/2018  . Tremor  06/26/2017  . Left knee pain 06/18/2017  . Right knee pain 12/11/2016  . 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 Sun-damaged skin 12/09/2016  . 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 Mild cognitive impairment 08/21/2016  . Mild single current episode of major depressive disorder ([place]) 08/21/2016  . Low back pain 08/10/2016  . Joint pain 08/08/2016  . Prolapsed internal hemorrhoids, grade 2 06/18/2016  . Cognitive change 06/10/2016  . Family history of colon cancer 05/19/2016  . Vitamin D deficiency 03/04/2016  . Deformity of metatarsal bone of right foot 08/15/2015  . Metatarsalgia of right foot 08/15/2015  . Otalgia 04/02/2015  . Preventative health care 06/25/2014  . Sinusitis 12/09/2013  . Osteopenia 09/19/2013  . Hypothyroidism 09/08/2013  . Osteoarthritis, knee 09/06/2013  . Skin lesion 09/01/2013  . Dysphagia, unspecified(787.20) 07/19/2013  . Allergic rhinitis 11/16/2012  . Osteoarthritis of both hands 10/15/2012  . Peripheral neuropathy 12/19/2011  . 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 GERD (gastroesophageal reflux disease) 03/07/2011  . Fatty liver 12/19/2010  . Genital herpes 08/06/2010  . MAMMOGRAM, ABNORMAL 08/02/2010  . ABNORMAL ELECTROCARDIOGRAM 07/19/2010  . Obstructive sleep apnea 06/18/2010  .  DM (diabetes mellitus), type 2 with neurological complications ([place]) 54/27/0623  . Hyperlipidemia 04/12/2010  . Essential hypertension 04/12/2010  . Asthma 04/12/2010  . DEPRESSION/ANXIETY 03/13/2010    Past Surgical History:  Procedure Laterality Date  . APPENDECTOMY     age 11  . CATARACT EXTRACTION Bilateral 2016   Dr. Bing Plume  . CATARACT EXTRACTION, BILATERAL     12/8 and 12/14  . COLONOSCOPY  06/03/2010, 08/25/2011    severeal - polyps and diverticulosis   . DILATATION & CURETTAGE/HYSTEROSCOPY WITH MYOSURE N/A 04/01/2018   Procedure: DILATATION & CURETTAGE/HYSTEROSCOPY WITH MYOSURE POLYPECTOMY;  Surgeon: Emily Filbert,  MD;  Location: [facility];  Service: Gynecology;  Laterality: N/A;  . EYE SURGERY     cateracts removed bilateral  . GASTRECTOMY     partial - ulcer as a teen  . HAND SURGERY Right    thumb   . HEMORRHOID BANDING    . KNEE ARTHROSCOPY Left 1998  . NASAL SEPTUM SURGERY    . RHINOPLASTY    . TOE SURGERY Right 1995    OB History    Gravida  4   Para  4   Term      Preterm      AB      Living  3     SAB      TAB      Ectopic      Multiple      Live Births           Obstetric Comments  1 child died at 19 weeks (heart defects, perforated bowel)         Home Medications    Prior to Admission medications   Medication Sig Start Date End Date Taking? Authorizing Provider  albuterol (PROVENTIL HFA;VENTOLIN HFA) 108 (90 Base) MCG/ACT inhaler Inhale 2 puffs into the lungs every 6 (six) hours as needed for wheezing or shortness of breath. 10/27/18  Yes Saguier, Percell Miller, PA-C  atenolol (TENORMIN) 25 MG tablet Take 1 tablet (25 mg total) by mouth daily. 02/09/19  Yes [name], MD  atorvastatin (LIPITOR) 80 MG tablet Take 1 tablet by mouth once daily 09/30/19  Yes [name], MD  budesonide-formoterol [facility]) 80-4.5 MCG/ACT inhaler Inhale 2 puffs into the lungs 2 (two) times a day. 12/20/18  Yes Lauraine Rinne, NP  cetirizine (ZYRTEC) 10 MG tablet Take 1 tablet (10 mg total) by mouth daily as needed for allergies. 05/07/17  Yes [name], MD  Cholecalciferol (VITAMIN D3) 2000 units TABS Take 1 tablet by mouth daily.   Yes [provider]  fenofibrate 160 MG tablet TAKE 1 TABLET (160 MG TOTAL) DAILY. 05/17/19  Yes [name], MD  glimepiride (AMARYL) 4 MG tablet TAKE 1 TABLET TWICE DAILY 04/12/19  Yes [name], MD  levothyroxine (SYNTHROID) 25 MCG tablet TAKE 1 TABLET BY MOUTH ONCE DAILY BEFORE  BREAKFAST 06/23/19  Yes [name], MD  losartan (COZAAR) 50 MG tablet Take 1 tablet (50 mg total) by mouth daily. 02/09/19  Yes [name], MD    montelukast (SINGULAIR) 10 MG tablet TAKE 1 TABLET BY MOUTH AT BEDTIME 11/09/19  Yes Lauraine Rinne, NP  Multiple Vitamins-Minerals (CENTRUM SILVER PO) Take 1 tablet by mouth daily.     Yes [provider]  pantoprazole (PROTONIX) 40 MG tablet Take 1 tablet (40 mg total) by mouth daily. 02/09/19  Yes [name], MD  Probiotic Product (ALIGN) 4 MG CAPS  Take 1 capsule by mouth daily. Reported on 07/02/2015   Yes [provider]  sitaGLIPtin (JANUVIA) 100 MG tablet Take 1 tablet (100 mg total) by mouth daily. 10/27/18  Yes Philemon Kingdom, MD  albuterol (VENTOLIN HFA) 108 (90 Base) MCG/ACT inhaler INHALE 2 PUFFS BY MOUTH INTO THE LUNGS EVERY 4 (FOUR) HOURS AS NEEDED FOR SHORTNESS OF BREATH AND WHEEZING 12/02/18   [name], MD  Alcohol Swabs (B-D SINGLE USE SWABS REGULAR) PADS Use as directed to check blood sugar. DX E11.9 07/15/17   [name], MD  cephALEXin (KEFLEX) 500 MG capsule Take 1 capsule (500 mg total) by mouth 2 (two) times daily. 01/07/20   Noe Gens, PA-C  clotrimazole (LOTRIMIN) 1 % cream Apply to affected area 2 times daily for up to 4 weeks 01/07/20   Noe Gens, PA-C  diclofenac (VOLTAREN) 75 MG EC tablet Take 1 tablet (75 mg total) by mouth 2 (two) times daily. 09/27/18   Hudnall, Sharyn Lull, MD  dicyclomine (BENTYL) 10 MG capsule TAKE 1 CAPSULE BY MOUTH EVERY 4 TO 6 HOURS AS NEEDED FOR ABDOMINAL PAIN AND CRAMPING OR DIARRHEA 06/04/18   Levin Erp, PA  diphenoxylate-atropine (LOMOTIL) 2.5-0.025 MG tablet Take 1 tablet by mouth every morning. If continued diarrhea in the next 2-3 days may increase to twice daily. Patient not taking: Reported on 06/16/2019 06/22/18   Willia Craze, NP  donepezil (ARICEPT) 10 MG tablet Take 1 tablet (10 mg total) by mouth at bedtime. 02/24/18 06/16/19  Aundra Dubin, MD  escitalopram (LEXAPRO) 20 MG tablet Take 1 tablet (20 mg total) by mouth daily. 02/24/18   Aundra Dubin, MD  fluticasone  (FLOVENT HFA) 110 MCG/ACT inhaler Inhale 1-2 puffs into the lungs 2 (two) times daily. 12/02/18   [name], MD  gabapentin (NEURONTIN) 100 MG capsule Take 1 capsule (100 mg total) by mouth 3 (three) times daily. Patient not taking: Reported on 06/16/2019 09/02/18   Debbrah Alar, NP  glucose blood (ONETOUCH VERIO) test strip Use as instructed to check blood sugar 2 times a day 10/27/18   Philemon Kingdom, MD  hydrochlorothiazide (HYDRODIURIL) 25 MG tablet Take 1 tablet (25 mg total) by mouth daily. Patient not taking: Reported on 06/16/2019 09/02/18   Debbrah Alar, NP  Lancets Ultra Fine MISC Use to check blood sugar two times a day 10/27/18   Philemon Kingdom, MD  valACYclovir (VALTREX) 1000 MG tablet Take 1 tablet (1,000 mg total) by mouth 3 (three) times daily. 07/04/19   [name], MD    Family History Family History  Problem Relation Age of Onset  . Diabetes Mother        type II  . Heart attack Mother 49  . Stroke Mother   . Hyperlipidemia Mother   . Colon cancer Sister   . Cancer Sister        GI  . Heart disease Sister   . Asthma Maternal Grandmother   . Mental illness Son        PTSD from [employer]  . Parkinsonism Father   . Cancer Sister        stage 4 bladder cancer  . Colon polyps Sister   . Ulcers Other        bleeding gastric  . Heart defect Other        child  . Other Other        perforated bowel, child  . Cancer Brother  pancreatic cancer  . Amblyopia Neg Hx   . Blindness Neg Hx   . Cataracts Neg Hx   . Glaucoma Neg Hx   . Macular degeneration Neg Hx   . Retinal detachment Neg Hx   . Strabismus Neg Hx   . Retinitis pigmentosa Neg Hx     Social History Social History   Tobacco Use  . Smoking status: Never Smoker  . Smokeless tobacco: Never Used  Vaping Use  . Vaping Use: Never used  Substance Use Topics  . Alcohol use: Yes    Alcohol/week: 0.0 standard drinks    Comment: Rare occasions   . Drug use: No      Allergies   Compazine [prochlorperazine edisylate], Sulfa antibiotics, Tetanus toxoid, and Metformin   Review of Systems Review of Systems  Constitutional: Negative for chills and fever.  Skin: Positive for rash. Negative for wound.     Physical Exam Triage Vital Signs ED Triage Vitals  Enc Vitals Group     BP 01/07/20 1020 (!) 175/85     Pulse Rate 01/07/20 1020 62     Resp 01/07/20 1020 18     Temp 01/07/20 1020 97.9 F (36.6 C)     Temp Source 01/07/20 1020 Oral     SpO2 01/07/20 1020 99 %     Weight --      Height --      Head Circumference --      Peak Flow --      Pain Score 01/07/20 1021 4     Pain Loc --      Pain Edu? --      Excl. in [place]? --    No data found.  Updated Vital Signs BP (!) 175/85 (BP Location: Right Arm) Comment: no meds this am  Pulse 62   Temp 97.9 F (36.6 C) (Oral)   Resp 18   LMP  (LMP Unknown)   SpO2 99%   Visual Acuity Right Eye Distance:   Left Eye Distance:   Bilateral Distance:    Right Eye Near:   Left Eye Near:    Bilateral Near:     Physical Exam Vitals and nursing note reviewed.  Constitutional:      Appearance: She is well-developed.  HENT:     Head: Normocephalic and atraumatic.  Cardiovascular:     Rate and Rhythm: Normal rate.  Pulmonary:     Effort: Pulmonary effort is normal.  Chest:    Musculoskeletal:        General: Normal range of motion.     Cervical back: Normal range of motion.  Skin:    General: Skin is warm and dry.  Neurological:     Mental Status: She is alert and oriented to person, place, and time.  Psychiatric:        Behavior: Behavior normal.      UC Treatments / Results  Labs (all labs ordered are listed, but only abnormal results are displayed) Labs Reviewed - No data to display  EKG   Radiology No results found.  Procedures Procedures (including critical care time)  Medications Ordered in UC Medications - No data to display  Initial Impression /  Assessment and Plan / UC Course  I have reviewed the triage vital signs and the nursing notes.  Pertinent labs & imaging results that were available during my care of the patient were reviewed by me and considered in my medical decision making (see chart for details).  Hx and exam c/w intertrigo Will tx for bacterial and yeast infection with oral and topical medication F/u PCP AVS given  Final Clinical Impressions(s) / UC Diagnoses   Final diagnoses:  Intertrigo     Discharge Instructions      Please take antibiotics as prescribed and be sure to complete entire course even if you start to feel better to ensure infection does not come back.  Also use the antifungal cream as prescribed to help with a skin yeast infection. Try to keep the area clean and dry. Change clothes as soon as possible if working outside and sweating.  Follow up with family medicine or a dermatologist if not improving in 1 week, sooner if worsening.    ED Prescriptions    Medication Sig Dispense Auth. Provider   clotrimazole (LOTRIMIN) 1 % cream Apply to affected area 2 times daily for up to 4 weeks 24 g Lillyonna Armstead O, PA-C   cephALEXin (KEFLEX) 500 MG capsule Take 1 capsule (500 mg total) by mouth 2 (two) times daily. 14 capsule Noe Gens, [place]     PDMP not reviewed this encounter.   Noe Gens, PA-C 01/08/20 1005

## 2020-01-18 ENCOUNTER — Other Ambulatory Visit: Payer: Self-pay | Admitting: Surgery

## 2020-01-18 DIAGNOSIS — E041 Nontoxic single thyroid nodule: Secondary | ICD-10-CM

## 2020-01-26 ENCOUNTER — Other Ambulatory Visit: Payer: Medicare HMO

## 2020-02-03 ENCOUNTER — Ambulatory Visit
Admission: RE | Admit: 2020-02-03 | Discharge: 2020-02-03 | Disposition: A | Discharge status: Home / Self Care | Payer: Medicare HMO | Source: Ambulatory Visit | Attending: Surgery | Admitting: Surgery

## 2020-02-03 DIAGNOSIS — E041 Nontoxic single thyroid nodule: Secondary | ICD-10-CM | POA: Diagnosis not present

## 2020-02-16 LAB — HM DIABETES EYE EXAM

## 2020-02-20 ENCOUNTER — Other Ambulatory Visit: Payer: Self-pay | Admitting: Family Medicine

## 2020-02-22 ENCOUNTER — Encounter: Payer: Self-pay | Admitting: *Deleted

## 2020-03-22 ENCOUNTER — Other Ambulatory Visit: Payer: Self-pay

## 2020-03-22 ENCOUNTER — Ambulatory Visit: Payer: Medicare HMO | Admitting: Podiatry

## 2020-03-22 DIAGNOSIS — Q828 Other specified congenital malformations of skin: Secondary | ICD-10-CM | POA: Diagnosis not present

## 2020-03-22 DIAGNOSIS — M79674 Pain in right toe(s): Secondary | ICD-10-CM

## 2020-03-22 DIAGNOSIS — E1149 Type 2 diabetes mellitus with other diabetic neurological complication: Secondary | ICD-10-CM | POA: Diagnosis not present

## 2020-03-22 DIAGNOSIS — M2042 Other hammer toe(s) (acquired), left foot: Secondary | ICD-10-CM | POA: Diagnosis not present

## 2020-03-22 DIAGNOSIS — E119 Type 2 diabetes mellitus without complications: Secondary | ICD-10-CM | POA: Diagnosis not present

## 2020-03-22 DIAGNOSIS — M2041 Other hammer toe(s) (acquired), right foot: Secondary | ICD-10-CM | POA: Diagnosis not present

## 2020-03-22 MED ORDER — GABAPENTIN 100 MG PO CAPS: 100.0000 mg | ORAL_CAPSULE | Freq: Three times a day (TID) | ORAL | 3 refills | Status: AC

## 2020-03-22 MED FILL — GABAPENTIN 100 MG CAPSULE: 100 | 30 days supply | Qty: 90 | Fill #0

## 2020-03-22 NOTE — Patient Instructions (Signed)

## 2020-03-23 ENCOUNTER — Other Ambulatory Visit: Payer: Self-pay | Admitting: Family Medicine

## 2020-03-26 ENCOUNTER — Other Ambulatory Visit: Payer: Medicare HMO | Admitting: Orthotics

## 2020-03-26 ENCOUNTER — Other Ambulatory Visit: Payer: Self-pay

## 2020-03-29 NOTE — Progress Notes (Signed)
Subjective: 69 year old female presents the office today for concerns of her fourth and fifth toes rubbing on the right foot.  She also had her nails and calluses were trimmed.  Denies any open sores.  She is diabetic neuropathy A1c was 6.3 she reports.  Denies any open sores. Denies any systemic complaints such as fevers, chills, nausea, vomiting. No acute changes since last appointment, and no other complaints at this time.   Objective: AAO x3, NAD DP/PT pulses palpable bilaterally, CRT less than 3 seconds Sensation decreased with Semmes Weinstein monofilament Nails are hypertrophic, dystrophic with yellow-brown discoloration of the nails or tenderness 1 through 5 bilaterally.  No edema, erythema or signs of infection. Hyperkeratotic lesion right plantar foot.  No ongoing ulceration drainage or signs of infection Adductovarus is present fourth and fifth toes resulting in rubbing of the digits.  No open sores. 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  -Dispensed offloading pads to the fourth and fifth toes.  Currently no skin breakdown or callus formation -Nails debrided D56 without complications or bleeding -Hyperkeratotic lesion sharply debrided x1 without any complications or bleeding -Daily foot inspection. -Patient encouraged to call the office with any questions, concerns, change in symptoms.   Trula Slade DPM

## 2020-04-10 ENCOUNTER — Telehealth: Payer: Self-pay

## 2020-04-10 NOTE — Telephone Encounter (Signed)
Pt is having serve pain in her right foot. The pain is very bad. Pt feels like something is stabbing her. Please advise.

## 2020-04-10 NOTE — Telephone Encounter (Signed)
Attempted to call patient, no answer. Left VM to call back.

## 2020-04-29 ENCOUNTER — Other Ambulatory Visit: Payer: Self-pay | Admitting: Family Medicine

## 2020-04-30 ENCOUNTER — Telehealth: Payer: Self-pay | Admitting: Podiatry

## 2020-04-30 NOTE — Telephone Encounter (Signed)
Pt left message @ 809am today asking for a call back to get her scheduled to pick up diabetic shoes.  I returned call and it goes directly to voicemail and I left message to call to schedule that appt and my next available is around 10.29.Marland Kitchen

## 2020-05-01 ENCOUNTER — Telehealth: Payer: Self-pay | Admitting: Podiatry

## 2020-05-01 NOTE — Telephone Encounter (Signed)
Pt left message @ 1239pm stating she needed to schedule an appt to pick up diabetic shoes and for someone to call her back.  I returned call and it goes directly to voicemail and I have left a message for her to call to schedule an appt .

## 2020-05-08 NOTE — Progress Notes (Addendum)
[facility] Note  05/11/2020     CHIEF COMPLAINT Patient presents for Retina Follow Up   HISTORY OF PRESENT ILLNESS: Crystal [name] is a 69 y.o. female who presents to the clinic today for:   HPI    Retina Follow Up    Patient presents with  Retinal Break/Detachment.  Duration of 1 year.  Since onset it is stable.  I, the attending physician,  performed the HPI with the patient and updated documentation appropriately.          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  Denies vision problems.  She did update her glasses since last visit but no much of a change she states.   Denies using eye drops. BS 118 after supper yesterday, A1C 6.2        Last edited by Bernarda Caffey, MD on 05/11/2020  1:52 PM. (History)    pt states no changes in vision, she had a slight change in glasses Rx, no new health problems   Referring physician: Calvert Cantor, MD [address] [place],  [place] [id number]  HISTORICAL INFORMATION:   Selected notes from the MEDICAL RECORD NUMBER Referral from Dr. Bing Plume for retina eval: - last exam by [facility], 03/17/17 - 2 wk history of floaters OU - hx of DM2 since 2003 - pseudophakia OU (complex CEIOL OD, 12.14.16, OS: 11.28.16)   CURRENT MEDICATIONS: No current outpatient medications on file. (Ophthalmic Drugs)   No current facility-administered medications for this visit. (Ophthalmic Drugs)   Current Outpatient Medications (Other)  Medication Sig  . albuterol (PROVENTIL HFA;VENTOLIN HFA) 108 (90 Base) MCG/ACT inhaler Inhale 2 puffs into the lungs every 6 (six) hours as needed for wheezing or shortness of breath.  Marland Kitchen albuterol (VENTOLIN HFA) 108 (90 Base) MCG/ACT inhaler INHALE 2 PUFFS BY MOUTH INTO THE LUNGS EVERY 4 (FOUR) HOURS AS NEEDED FOR SHORTNESS OF BREATH AND WHEEZING  . atenolol (TENORMIN) 25 MG tablet Take 1 tablet (25 mg total) by mouth daily.  Marland Kitchen atorvastatin (LIPITOR) 80 MG tablet  Take 1 tablet by mouth once daily  . budesonide-formoterol (SYMBICORT) 80-4.5 MCG/ACT inhaler Inhale 2 puffs into the lungs 2 (two) times a day.  . cetirizine (ZYRTEC) 10 MG tablet Take 1 tablet (10 mg total) by mouth daily as needed for allergies.  . Cholecalciferol (VITAMIN D3) 2000 units TABS Take 1 tablet by mouth daily.  . clotrimazole (LOTRIMIN) 1 % cream Apply to affected area 2 times daily for up to 4 weeks  . diclofenac (VOLTAREN) 75 MG EC tablet Take 1 tablet (75 mg total) by mouth 2 (two) times daily.  Marland Kitchen dicyclomine (BENTYL) 10 MG capsule TAKE 1 CAPSULE BY MOUTH EVERY 4 TO 6 HOURS AS NEEDED FOR ABDOMINAL PAIN AND CRAMPING OR DIARRHEA  . diphenoxylate-atropine (LOMOTIL) 2.5-0.025 MG tablet Take 1 tablet by mouth every morning. If continued diarrhea in the next 2-3 days may increase to twice daily.  Marland Kitchen escitalopram (LEXAPRO) 20 MG tablet Take 1 tablet (20 mg total) by mouth daily.  . fenofibrate 160 MG tablet TAKE 1 TABLET (160 MG TOTAL) DAILY.  . fluticasone (FLOVENT HFA) 110 MCG/ACT inhaler Inhale 1-2 puffs into the lungs 2 (two) times daily.  Marland Kitchen glimepiride (AMARYL) 4 MG tablet TAKE 1 TABLET TWICE DAILY  . glucose blood (ONETOUCH VERIO) test strip Use as instructed to check blood sugar 2 times a day  . hydrochlorothiazide (HYDRODIURIL) 25 MG  tablet Take 1 tablet (25 mg total) by mouth daily.  Marland Kitchen levothyroxine (SYNTHROID) 25 MCG tablet TAKE 1 TABLET BY MOUTH ONCE DAILY BEFORE  BREAKFAST  . losartan (COZAAR) 50 MG tablet Take 1 tablet by mouth once daily  . montelukast (SINGULAIR) 10 MG tablet TAKE 1 TABLET BY MOUTH AT BEDTIME  . Multiple Vitamins-Minerals (CENTRUM SILVER PO) Take 1 tablet by mouth daily.    . pantoprazole (PROTONIX) 40 MG tablet Take 1 tablet by mouth once daily  . Probiotic Product (ALIGN) 4 MG CAPS Take 1 capsule by mouth daily. Reported on 07/02/2015  . sitaGLIPtin (JANUVIA) 100 MG tablet Take 1 tablet (100 mg total) by mouth daily.  . valACYclovir (VALTREX) 1000 MG  tablet Take 1 tablet (1,000 mg total) by mouth 3 (three) times daily.  . Alcohol Swabs (B-D SINGLE USE SWABS REGULAR) PADS Use as directed to check blood sugar. DX E11.9  . cephALEXin (KEFLEX) 500 MG capsule Take 1 capsule (500 mg total) by mouth 2 (two) times daily. (Patient not taking: Reported on 05/11/2020)  . donepezil (ARICEPT) 10 MG tablet Take 1 tablet (10 mg total) by mouth at bedtime.  . gabapentin (NEURONTIN) 100 MG capsule Take 1 capsule (100 mg total) by mouth 3 (three) times daily. (Patient not taking: Reported on 05/11/2020)  . Lancets Ultra Fine MISC Use to check blood sugar two times a day   No current facility-administered medications for this visit. (Other)      REVIEW OF SYSTEMS: ROS    Positive for: Gastrointestinal, Musculoskeletal, Endocrine, Eyes, Respiratory, Psychiatric   Negative for: Constitutional, Neurological, Skin, Genitourinary, HENT, Cardiovascular, Allergic/Imm, Heme/Lymph   Last edited by Leonie Douglas, COA on 05/11/2020  1:41 PM. (History)       ALLERGIES Allergies  Allergen Reactions  . Compazine [Prochlorperazine Edisylate]     Comma for 3 days  . Sulfa Antibiotics Itching    severe itching  . Tetanus Toxoid Other (See Comments)    convulsions  . Gabapentin (Once-Daily)   . Metformin     Other reaction(s): GI Upset (intolerance) Extreme diarrhea    PAST MEDICAL HISTORY Past Medical History:  Diagnosis Date  . Acute peptic ulcer of stomach    As a teenager  . Allergy    dust, cock roach,grass,weeds,molds  . Anxiety   . Arthritis    knees, hands, shoulders  . Asthma    02/08/10 FEV1 1.98 (80%), s/p saba 2.22 l/m (90%).nml  . Broken ankle    right foot, wearing a boot  . Cognitive change 06/10/2016  . Dementia ([place])   . Depression   . Diabetes mellitus 2003   Type II  . Diverticulosis   . Dyspepsia   . Fatty liver 12/19/2010  . GERD (gastroesophageal reflux disease)   . Hearing loss    bilateral hearing aids   . Herpes  simplex virus (HSV) infection   . High cholesterol   . Hyperlipemia   . Hypertension   . Hypothyroidism 09/08/2013  . IBS (irritable bowel syndrome)   . Insomnia   . Internal hemorrhoids   . Joint pain 08/08/2016  . Low back pain 08/10/2016  . OSA (obstructive sleep apnea)    Does not use CPAP - old one broken, new sleep study to be scheduled and then given a new CPAP machine  . Plantar fasciitis    no longer an issue  . Sun-damaged skin 12/09/2016  . SVD (spontaneous vaginal delivery)    x 4  . Vaginal Pap  smear, abnormal   . Vitamin D deficiency 03/04/2016  . Wears partial dentures    upper dentures and lower partial   Past Surgical History:  Procedure Laterality Date  . APPENDECTOMY     age 68  . CATARACT EXTRACTION Bilateral 2016   Dr. Bing Plume  . CATARACT EXTRACTION, BILATERAL     12/8 and 12/14  . COLONOSCOPY  06/03/2010, 08/25/2011    severeal - polyps and diverticulosis   . DILATATION & CURETTAGE/HYSTEROSCOPY WITH MYOSURE N/A 04/01/2018   Procedure: DILATATION & CURETTAGE/HYSTEROSCOPY WITH MYOSURE POLYPECTOMY;  Surgeon: Emily Filbert, MD;  Location: [facility];  Service: Gynecology;  Laterality: N/A;  . EYE SURGERY     cateracts removed bilateral  . GASTRECTOMY     partial - ulcer as a teen  . HAND SURGERY Right    thumb   . HEMORRHOID BANDING    . KNEE ARTHROSCOPY Left 1998  . NASAL SEPTUM SURGERY    . RHINOPLASTY    . TOE SURGERY Right 1995    FAMILY HISTORY Family History  Problem Relation Age of Onset  . Diabetes Mother        type II  . Heart attack Mother 76  . Stroke Mother   . Hyperlipidemia Mother   . Colon cancer Sister   . Cancer Sister        GI  . Heart disease Sister   . Asthma Maternal Grandmother   . Mental illness Son        PTSD from [employer]  . Parkinsonism Father   . Cancer Sister        stage 4 bladder cancer  . Colon polyps Sister   . Ulcers Other        bleeding gastric  . Heart defect Other        child  . Other Other         perforated bowel, child  . Cancer Brother        pancreatic cancer  . Amblyopia Neg Hx   . Blindness Neg Hx   . Cataracts Neg Hx   . Glaucoma Neg Hx   . Macular degeneration Neg Hx   . Retinal detachment Neg Hx   . Strabismus Neg Hx   . Retinitis pigmentosa Neg Hx     SOCIAL HISTORY Social History   Tobacco Use  . Smoking status: Never Smoker  . Smokeless tobacco: Never Used  Vaping Use  . Vaping Use: Never used  Substance Use Topics  . Alcohol use: Yes    Alcohol/week: 0.0 standard drinks    Comment: Rare occasions   . Drug use: No         OPHTHALMIC EXAM:  Base Eye Exam    Visual Acuity (Snellen - Linear)      Right Left   Dist cc 20/30 20/30 +2   Dist ph cc 20/25 20/25       Tonometry (Tonopen, 1:48 PM)      Right Left   Pressure 15 14       Pupils      Dark Light Shape React APD   Right   Irregular Brisk None   Left 3 2 Round Brisk None       Visual Fields (Counting fingers)      Left Right    Full Full       Extraocular Movement      Right Left    Full Full       Neuro/Psych  Oriented x3: Yes   Mood/Affect: Normal       Dilation    Both eyes: 1.0% Mydriacyl, 2.5% Phenylephrine @ 1:48 PM        Slit Lamp and Fundus Exam    External Exam      Right Left   External Normal Normal       Slit Lamp Exam      Right Left   Lids/Lashes Dermatochalasis - upper lid  Dermatochalasis - upper lid temporally, Meibomian gland dysfunction   Conjunctiva/Sclera White and quiet White and quiet   Cornea 1+ Punctate epithelial erosions, mild endo pigment, well healed temporal cataract wound with residual nylon suture at 0900 Well healed cataract wounds, trace Punctate epithelial erosions   Anterior Chamber Deep and clear, mild vitreous prolapse temporally Deep and quiet   Iris Slightly irregular -- mild peaking temporally, TID at temporal pupil margin Round and dilated; PPM at 0600    Lens PC IOL in good position with open PC, mild vitreous prolapse  around temporal lens edge Posterior chamber intraocular lens - good position   Vitreous Vitreous syneresis Vitreous syneresis; Posterior vitreous detachment       Fundus Exam      Right Left   Disc Pink and sharp, temporal PPP, +cupping +cupping, mild tilt, PPP, Pink and Sharp   C/D Ratio 0.6 0.7   Macula Flat, Blunted foveal reflex, mild Epiretinal membrane, mild RPE mottling Flat, blunted foveal reflex, Retinal pigment epithelial mottling, No heme or edema   Vessels Vascular attenuation, Tortuous, AV crossing changes Vascular attenuation - mild   Periphery flat; attached; trace RPE changes; hair line Retinal tears at 3 and 4 o'clock w/ good laser scars surrounding -- basically ora; peripheral cystoid degeneration; ST cobblestoning, No new RT/RD Flat; attached; trace RPE changes, peripheral cystoid degneration          IMAGING AND PROCEDURES  Imaging and Procedures for 04/30/17  OCT, Retina - OU - Both Eyes       Right Eye Quality was good. Central Foveal Thickness: 332. Progression has been stable. Findings include epiretinal membrane, no IRF, no SRF, normal foveal contour (Trace progression of ERM).   Left Eye Quality was good. Central Foveal Thickness: 274. Progression has been stable. Findings include normal foveal contour, no IRF, no SRF.   Notes Images taken, stored on drive   Diagnosis / Impression:  OD - NFP; no IRF/SRF; Trace progression of ERM OS - NFP; no IRF/SRF  Clinical management:  See below  Abbreviations: NFP - Normal foveal profile. CME - cystoid macular edema. PED - pigment epithelial detachment. IRF - intraretinal fluid. SRF - subretinal fluid. EZ - ellipsoid zone. ERM - epiretinal membrane. ORA - outer retinal atrophy. ORT - outer retinal tubulation. SRHM - subretinal hyper-reflective material                  ASSESSMENT/PLAN:    ICD-10-CM   1. 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 Posterior vitreous detachment of  both eyes  H43.813   3. Type 2 diabetes mellitus without complication, without long-term current use of insulin (HCC)  E11.9   4. Pseudophakia of both eyes  Z96.1   5. CME (cystoid macular edema), right  H35.351   6. Retinal edema  H35.81 OCT, Retina - OU - Both Eyes  7. Epiretinal membrane (ERM) of right eye  H35.371   8. Cystoid macular edema of right eye  H35.351     1.  Peripheral retinal tears OD  - Small flap tears at ora, 0300 and 0400 oclock  - s/p barrier laser retinopexy OD, 03/18/17 -- good laser in place  - stable; No new RT/RD  2. PVD / vitreous syneresis OU  - Discussed findings and prognosis  - No RT or RD on 360 scleral depressed exam OS  - Reviewed s/s of RT/RD  - Strict return precautions for any such RT/RD signs/symptoms  3. DM2 without retinopathy  - On oral meds  - Discussed importance of tight BG and BP control  4. Pseudophakia OU  - IOLs in good position OU -- beautiful surgeries by Dr. Bing Plume  - OD with history of anterior vitrectomy and 3 piece sulcus IOL  - monitor  5,6. CME OD  - Remains resolved now since 12.20.19 -- was on PF and ketorolac  - Etiology could be multifactorial -- ERM, delayed post-cataract  - completed PF taper   - monitor  7. Epiretinal membrane, right eye   - Mild progression of ERM since last year  - May have contributed to CME OD (#5)  - Not yet surgical -- monitor for pucker / symptoms  - F/u 1 yr, sooner prn  Ophthalmic Meds Ordered this visit:  No orders of the defined types were placed in this encounter.      Return in about 1 year (around 05/11/2021) for f/u 1 year, DM/ERM OD, DFE, OCT.  There are no Patient Instructions on file for this visit.   Explained the diagnoses, plan, and follow up with the patient and they expressed understanding.  Patient expressed understanding of the importance of proper follow up care.   This document serves as a record of services personally performed by Gardiner Sleeper, MD, PhD.  It was created on their behalf by San Jetty. Owens Shark, OA an ophthalmic technician. The creation of this record is the provider's dictation and/or activities during the visit.    Electronically signed by: San Jetty. Owens Shark, [place] 10.26.2021 10:48 PM   Gardiner Sleeper, M.D., Ph.D. Diseases & Surgery of the Retina and Vitreous [facility]  I have reviewed the above documentation for accuracy and completeness, and I agree with the above. Gardiner Sleeper, M.D., Ph.D. 05/11/20 10:48 PM  Abbreviations: M myopia (nearsighted); A astigmatism; H hyperopia (farsighted); P presbyopia; Mrx spectacle prescription;  CTL contact lenses; OD right eye; OS left eye; OU both eyes  XT exotropia; ET esotropia; PEK punctate epithelial keratitis; PEE punctate epithelial erosions; DES dry eye syndrome; MGD meibomian gland dysfunction; ATs artificial tears; PFAT's preservative free artificial tears; [place] nuclear sclerotic cataract; PSC posterior subcapsular cataract; ERM epi-retinal membrane; PVD posterior vitreous detachment; RD retinal detachment; DM diabetes mellitus; DR diabetic retinopathy; NPDR non-proliferative diabetic retinopathy; PDR proliferative diabetic retinopathy; CSME clinically significant macular edema; DME diabetic macular edema; dbh dot blot hemorrhages; CWS cotton wool spot; POAG primary open angle glaucoma; C/D cup-to-disc ratio; HVF humphrey visual field; GVF goldmann visual field; OCT optical coherence tomography; IOP intraocular pressure; BRVO Branch retinal vein occlusion; CRVO central retinal vein occlusion; CRAO central retinal artery occlusion; BRAO branch retinal artery occlusion; RT retinal tear; SB scleral buckle; PPV pars plana vitrectomy; VH Vitreous hemorrhage; PRP panretinal laser photocoagulation; IVK intravitreal kenalog; VMT vitreomacular traction; MH Macular hole;  NVD neovascularization of the disc; NVE neovascularization elsewhere; AREDS age related eye disease study; ARMD  age related macular degeneration; POAG primary open angle glaucoma; EBMD epithelial/anterior basement membrane dystrophy; ACIOL anterior chamber intraocular  lens; IOL intraocular lens; PCIOL posterior chamber intraocular lens; Phaco/IOL phacoemulsification with intraocular lens placement; [place] photorefractive keratectomy; LASIK laser assisted in situ keratomileusis; HTN hypertension; DM diabetes mellitus; COPD chronic obstructive pulmonary disease

## 2020-05-09 ENCOUNTER — Other Ambulatory Visit: Payer: Self-pay

## 2020-05-09 ENCOUNTER — Ambulatory Visit (INDEPENDENT_AMBULATORY_CARE_PROVIDER_SITE_OTHER): Payer: Medicare HMO | Admitting: Orthotics

## 2020-05-09 DIAGNOSIS — E1149 Type 2 diabetes mellitus with other diabetic neurological complication: Secondary | ICD-10-CM

## 2020-05-09 DIAGNOSIS — Q828 Other specified congenital malformations of skin: Secondary | ICD-10-CM | POA: Diagnosis not present

## 2020-05-09 DIAGNOSIS — E119 Type 2 diabetes mellitus without complications: Secondary | ICD-10-CM

## 2020-05-09 DIAGNOSIS — B351 Tinea unguium: Secondary | ICD-10-CM

## 2020-05-09 DIAGNOSIS — L84 Corns and callosities: Secondary | ICD-10-CM

## 2020-05-09 NOTE — Progress Notes (Signed)

## 2020-05-11 ENCOUNTER — Other Ambulatory Visit: Payer: Self-pay

## 2020-05-11 ENCOUNTER — Encounter (INDEPENDENT_AMBULATORY_CARE_PROVIDER_SITE_OTHER): Payer: Self-pay | Admitting: Ophthalmology

## 2020-05-11 ENCOUNTER — Ambulatory Visit (INDEPENDENT_AMBULATORY_CARE_PROVIDER_SITE_OTHER): Payer: Medicare HMO | Admitting: Ophthalmology

## 2020-05-11 DIAGNOSIS — H35371 Puckering of macula, right eye: Secondary | ICD-10-CM

## 2020-05-11 DIAGNOSIS — E119 Type 2 diabetes mellitus without complications: Secondary | ICD-10-CM

## 2020-05-11 DIAGNOSIS — Z961 Presence of intraocular lens: Secondary | ICD-10-CM

## 2020-05-11 DIAGNOSIS — H33331 Multiple defects of retina without detachment, right eye: Secondary | ICD-10-CM | POA: Diagnosis not present

## 2020-05-11 DIAGNOSIS — H43813 Vitreous degeneration, bilateral: Secondary | ICD-10-CM

## 2020-05-11 DIAGNOSIS — H3581 Retinal edema: Secondary | ICD-10-CM

## 2020-05-11 DIAGNOSIS — H35351 Cystoid macular degeneration, right eye: Secondary | ICD-10-CM

## 2020-06-04 ENCOUNTER — Other Ambulatory Visit: Payer: Self-pay | Admitting: Family Medicine

## 2020-06-13 ENCOUNTER — Other Ambulatory Visit: Payer: Self-pay | Admitting: Family Medicine

## 2020-06-15 ENCOUNTER — Ambulatory Visit: Payer: Medicare HMO | Admitting: Podiatry

## 2020-06-15 ENCOUNTER — Other Ambulatory Visit: Payer: Self-pay

## 2020-06-15 DIAGNOSIS — Q828 Other specified congenital malformations of skin: Secondary | ICD-10-CM

## 2020-06-15 DIAGNOSIS — B351 Tinea unguium: Secondary | ICD-10-CM

## 2020-06-15 DIAGNOSIS — E1149 Type 2 diabetes mellitus with other diabetic neurological complication: Secondary | ICD-10-CM

## 2020-06-15 DIAGNOSIS — M79675 Pain in left toe(s): Secondary | ICD-10-CM | POA: Diagnosis not present

## 2020-06-15 DIAGNOSIS — M79674 Pain in right toe(s): Secondary | ICD-10-CM

## 2020-06-15 NOTE — Patient Instructions (Signed)
[facility]ch[facility] Tendinitis Rehab Ask your health care provider which exercises are safe for you. Do exercises exactly as told by your health care provider and adjust them as directed. It is normal to feel mild stretching, pulling, tightness, or discomfort as you do these exercises. Stop right away if you feel sudden pain or your pain gets worse. Do not begin these exercises until told by your health care provider. Stretching and range-of-motion exercises These exercises warm up your muscles and joints and improve the movement and flexibility of your ankle. These exercises also help to relieve pain. Standing wall calf stretch with straight knee  1. Stand with your hands against a wall. 2. Extend your left / right leg behind you, and bend your front knee slightly. Keep both of your heels on the floor. 3. Point the toes of your back foot slightly inward. 4. Keeping your heels on the floor and your back knee straight, shift your weight toward the wall. Do not allow your back to arch. You should feel a gentle stretch in your upper calf. 5. Hold this position for __________ seconds. Repeat __________ times. Complete this exercise __________ times a day. Standing wall calf stretch with bent knee 1. Stand with your hands against a wall. 2. Extend your left / right leg behind you, and bend your front knee slightly. Keep both of your heels on the floor. 3. Point the toes of your back foot slightly inward. 4. Keeping your heels on the floor, bend your back knee slightly. You should feel a gentle stretch deep in your lower calf near your heel. 5. Hold this position for __________ seconds. Repeat __________ times. Complete this exercise __________ times a day. Strengthening exercises These exercises build strength and control of your ankle. Endurance is the ability to use your muscles for a long time, even after they get tired. Plantar flexion with band In this exercise, you push your toes downward, away from you,  with an exercise band providing resistance. 1. Sit on the floor with your left / right leg extended. You may put a pillow under your calf to give your foot more room to move. 2. Loop a rubber exercise band or tube around the ball of your left / right foot. The ball of your foot is on the walking surface, right under your toes. The band or tube should be slightly tense when your foot is relaxed. If the band or tube slips, you can put on your shoe or put a washcloth between the band and your foot to help it stay in place. 3. Slowly point your toes downward, pushing them away from you (plantar flexion). 4. Hold this position for __________ seconds. 5. Slowly release the tension in the band or tube, controlling smoothly until your foot is back to the starting position. 6. Repeat steps 1-5 with your left / right leg. Repeat __________ times. Complete this exercise __________ times a day. Eccentric heel drop  In this exercise, you stand and slowly raise your heel and then slowly lower it. This exercise lengthens the calf muscles (eccentric) while the heel bears weight. If this exercise is too easy, try doing it while wearing a backpack with weights in it. 1. Stand on a step with the balls of your feet. The ball of your foot is on the walking surface, right under your toes. ? Do not put your heels on the step. ? For balance, rest your hands on the wall or on a railing. 2. Rise up onto  the balls of your feet. 3. Keeping your heels up, shift all of your weight to your left / right leg and pick up your other leg. 4. Slowly lower your left / right leg so your heel drops below the level of the step. 5. Put down your other foot before returning to the start position. If told by your health care provider, build up to: ? 3 sets of 15 repetitions while keeping your knees straight. ? 3 sets of 15 repetitions while keeping your knees slightly bent as far as told by your health care provider. Repeat __________  times. Complete this exercise __________ times a day. Balance exercises These exercises improve or maintain your balance. Balance is important in preventing falls. Single leg stand If this exercise is too easy, you can try it with your eyes closed or while standing on a pillow. 1. Without shoes, stand near a railing or in a door frame. Hold on to the railing or door frame as needed. 2. Stand on your left / right foot. Keep your big toe down on the floor and try to keep your arch lifted. 3. Hold this position for __________ seconds. Repeat __________ times. Complete this exercise __________ times a day. This information is not intended to replace advice given to you by your health care provider. Make sure you discuss any questions you have with your health care provider. Document Revised: 10/18/2018 Document Reviewed: 04/12/2018 Elsevier Patient Education  [place].

## 2020-06-19 NOTE — Progress Notes (Addendum)
Subjective: 69 year old female presents the office today for concerns of thick, discolored toenails that she cannot trim her self which causes foot as well as hyperkeratotic lesions.  Denies any open sores.  Denies any increase in swelling or redness.  No recent falls.  She was not able to tolerate the gabapentin and she has discontinued this.  Also states that she has been having some Achilles tendinitis, pain to the back of her heel which is been ongoing intermittently for the last year.  No recent injury or falls or changes otherwise.  She has no other concerns today.  Last A1c- 8.9 on 03/06/2020 PCP- Penni Homans, MD  Objective: AAO x3, NAD DP/PT pulses palpable bilaterally, CRT less than 3 seconds Sensation decreased with Semmes Weinstein monofilament Nails are hypertrophic, dystrophic with yellow-brown discoloration of the nails or tenderness 1 through 5 bilaterally.  No edema, erythema or signs of infection. Hyperkeratotic lesion right plantar foot.  No ongoing ulceration drainage or signs of infection Adductovarus is present fourth and fifth there is no open lesions with this. On today's exam there is no tenderness palpation along the Achilles tendon, insertion.  No pain with lateral compression of calcaneus.  No pain to the Achilles tendon. 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  -Dispensed further offloading pads.  These been helpful for her. -Nails debrided N98 without complications or bleeding -Hyperkeratotic lesion sharply debrided x1 without any complications or bleeding -Stretching exercises discussed.  Supportive shoes and consider heel lift. -Daily foot inspection. -Patient encouraged to call  the office with any questions, concerns, change in symptoms.   Trula Slade DPM

## 2020-06-20 ENCOUNTER — Telehealth: Payer: Self-pay

## 2020-06-20 NOTE — Telephone Encounter (Signed)
It is in her AVS if someone can send it to her.

## 2020-06-20 NOTE — Telephone Encounter (Signed)
Pt called stating that she was in the office last Friday and did not receive instructions on exercises for her left foot. Please advise.

## 2020-06-22 ENCOUNTER — Ambulatory Visit: Payer: Medicare HMO | Admitting: Podiatry

## 2020-06-22 NOTE — Telephone Encounter (Signed)
AVS mailed to patient.

## 2020-06-26 ENCOUNTER — Other Ambulatory Visit: Payer: Self-pay | Admitting: Family Medicine

## 2020-07-12 ENCOUNTER — Other Ambulatory Visit: Payer: Self-pay | Admitting: Family Medicine

## 2020-08-20 ENCOUNTER — Other Ambulatory Visit: Payer: Self-pay | Admitting: Family Medicine

## 2020-08-27 ENCOUNTER — Other Ambulatory Visit: Payer: Self-pay | Admitting: Family Medicine

## 2020-09-07 ENCOUNTER — Other Ambulatory Visit: Payer: Self-pay | Admitting: Family Medicine

## 2020-09-14 ENCOUNTER — Other Ambulatory Visit: Payer: Self-pay

## 2020-09-14 ENCOUNTER — Ambulatory Visit (INDEPENDENT_AMBULATORY_CARE_PROVIDER_SITE_OTHER): Payer: Medicare Other | Admitting: Podiatry

## 2020-09-14 DIAGNOSIS — M79674 Pain in right toe(s): Secondary | ICD-10-CM | POA: Diagnosis not present

## 2020-09-14 DIAGNOSIS — B351 Tinea unguium: Secondary | ICD-10-CM

## 2020-09-14 DIAGNOSIS — M79675 Pain in left toe(s): Secondary | ICD-10-CM

## 2020-09-14 DIAGNOSIS — Q828 Other specified congenital malformations of skin: Secondary | ICD-10-CM

## 2020-09-14 DIAGNOSIS — E1149 Type 2 diabetes mellitus with other diabetic neurological complication: Secondary | ICD-10-CM

## 2020-09-17 NOTE — Progress Notes (Signed)
Subjective: 70 year old female presents the office today for concerns of thick, discolored toenails that she cannot trim her self which causes foot as well as hyperkeratotic lesions.  She also gets callus, corn on the right second toe, the first and second toes rub but denies any skin breakdown. No recent injury or falls or changes otherwise.  She has no other concerns today.  Last A1c- 8.9 on 03/06/2020 PCP- Penni Homans, MD  Objective: AAO x3, NAD DP/PT pulses palpable bilaterally, CRT less than 3 seconds Sensation decreased with Semmes Weinstein monofilament Nails are hypertrophic, dystrophic with yellow-brown discoloration of the nails or tenderness 1 through 5 bilaterally.  No edema, erythema or signs of infection. Hyperkeratotic lesion medial second IPJ on the right side.  There is no underlying ulceration drainage or signs of infection. 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  -Dispensed further offloading pads.  These been helpful for her. -Nails debrided I68 without complications or bleeding -Hyperkeratotic lesion sharply debrided x1 without any complications or bleeding.  Dispensed offloading pads. -Daily foot inspection. -Patient encouraged to call the office with any questions, concerns, change in symptoms.   Trula Slade DPM

## 2020-09-27 ENCOUNTER — Other Ambulatory Visit: Payer: Self-pay | Admitting: Family Medicine

## 2020-10-14 ENCOUNTER — Other Ambulatory Visit: Payer: Self-pay | Admitting: Family Medicine

## 2020-11-16 ENCOUNTER — Other Ambulatory Visit: Payer: Self-pay

## 2020-11-16 ENCOUNTER — Ambulatory Visit (INDEPENDENT_AMBULATORY_CARE_PROVIDER_SITE_OTHER): Payer: Medicare Other

## 2020-11-16 ENCOUNTER — Encounter: Payer: Self-pay | Admitting: Podiatry

## 2020-11-16 ENCOUNTER — Ambulatory Visit: Payer: Medicare Other | Admitting: Podiatry

## 2020-11-16 DIAGNOSIS — Q828 Other specified congenital malformations of skin: Secondary | ICD-10-CM

## 2020-11-16 DIAGNOSIS — L989 Disorder of the skin and subcutaneous tissue, unspecified: Secondary | ICD-10-CM

## 2020-11-16 DIAGNOSIS — E1149 Type 2 diabetes mellitus with other diabetic neurological complication: Secondary | ICD-10-CM

## 2020-11-16 DIAGNOSIS — S90852A Superficial foreign body, left foot, initial encounter: Secondary | ICD-10-CM | POA: Diagnosis not present

## 2020-11-16 DIAGNOSIS — M79672 Pain in left foot: Secondary | ICD-10-CM

## 2020-11-16 MED ORDER — TRIAMCINOLONE ACETONIDE 0.025 % EX OINT: 1.0000 "application " | TOPICAL_OINTMENT | Freq: Every day | CUTANEOUS | 0 refills | Status: AC

## 2020-11-16 NOTE — Progress Notes (Signed)
Subjective: 70 year old female presents the office today for concerns of calluses on her right foot which are causing discomfort as well she developed a new spot on the bottom of her left foot submetatarsal 4 over the last 3 weeks.  She denies of any foreign objects and denies any swelling or redness or any drainage.  No recent treatment. Denies any systemic complaints such as fevers, chills, nausea, vomiting. No acute changes since last appointment, and no other complaints at this time.   A1c 6.6 on 08/23/2020  Larene Beach, MD  Last seen 08/23/2020   Objective: AAO x3, NAD DP/PT pulses palpable bilaterally, CRT less than 3 seconds On the left foot submetatarsal 4 is a hyperkeratotic annular lesion.  Upon debridement there is 1 small piece of black tissue that was removed and appear to be almost splinter type lesion.  After further debridement there is no puncture wound identified.  There is no edema, erythema or drainage or pus or any signs of infection.  Hyperkeratotic lesions right foot submetatarsal 3, 4.  No underlying ulceration drainage or signs of infection.   Small flat skin lesion on the lateral aspect of the right foot.  Peers to be a small little area of likely dermatitis. No pain with calf compression, swelling, warmth, erythema  Assessment: Left foot skin lesion, rule out foreign body; hyperkeratotic lesion right foot; dermatitis  Plan: -All treatment options discussed with the patient including all alternatives, risks, complications.  -Sharp debrided hyperkeratotic lesions on the right foot x2 without any complications or bleeding. -Debrided the hyperkeratotic lesion the left foot without any complications.  A small black piece of possible splinter foreign body was removed.  After debridement no signs of infection or ulceration or foreign body.  I will order an x-ray to rule out foreign body. -Triamcinolone cream for the right skin lesion. -Daily foot inspection. -Patient  encouraged to call the office with any questions, concerns, change in symptoms.

## 2020-11-21 ENCOUNTER — Other Ambulatory Visit: Payer: Self-pay | Admitting: Family Medicine

## 2020-12-27 DIAGNOSIS — H7291 Unspecified perforation of tympanic membrane, right ear: Secondary | ICD-10-CM | POA: Insufficient documentation

## 2021-01-01 ENCOUNTER — Other Ambulatory Visit: Payer: Self-pay | Admitting: Family Medicine

## 2021-01-02 ENCOUNTER — Other Ambulatory Visit: Payer: Self-pay | Admitting: Family Medicine

## 2021-01-18 ENCOUNTER — Ambulatory Visit: Payer: Medicare Other | Admitting: Podiatry

## 2021-01-18 ENCOUNTER — Telehealth: Payer: Self-pay | Admitting: *Deleted

## 2021-01-18 NOTE — Telephone Encounter (Signed)
Patient is calling to cancel appointment for today with Dr Jacqualyn Posey, has been diagnosed w/ Covid.

## 2021-02-08 ENCOUNTER — Ambulatory Visit (INDEPENDENT_AMBULATORY_CARE_PROVIDER_SITE_OTHER): Payer: Medicare Other

## 2021-02-08 ENCOUNTER — Ambulatory Visit: Payer: Medicare Other | Admitting: Podiatry

## 2021-02-08 ENCOUNTER — Encounter: Payer: Self-pay | Admitting: Podiatry

## 2021-02-08 ENCOUNTER — Ambulatory Visit: Payer: Medicare Other

## 2021-02-08 ENCOUNTER — Other Ambulatory Visit: Payer: Self-pay

## 2021-02-08 DIAGNOSIS — M79672 Pain in left foot: Secondary | ICD-10-CM

## 2021-02-08 DIAGNOSIS — M79675 Pain in left toe(s): Secondary | ICD-10-CM | POA: Diagnosis not present

## 2021-02-08 DIAGNOSIS — M79674 Pain in right toe(s): Secondary | ICD-10-CM

## 2021-02-08 DIAGNOSIS — B351 Tinea unguium: Secondary | ICD-10-CM | POA: Diagnosis not present

## 2021-02-08 DIAGNOSIS — S90852D Superficial foreign body, left foot, subsequent encounter: Secondary | ICD-10-CM | POA: Diagnosis not present

## 2021-02-08 DIAGNOSIS — S90851A Superficial foreign body, right foot, initial encounter: Secondary | ICD-10-CM

## 2021-02-08 DIAGNOSIS — E1149 Type 2 diabetes mellitus with other diabetic neurological complication: Secondary | ICD-10-CM

## 2021-02-08 DIAGNOSIS — S90852A Superficial foreign body, left foot, initial encounter: Secondary | ICD-10-CM

## 2021-02-10 NOTE — Progress Notes (Signed)
Subjective: 70 year old female presents the office today for thick, discolored toenails that she cannot trim her self and they are causing discomfort.  Calluses in the right this been doing well with the area in the left foot is continue to cause discomfort.  She denies any swelling or redness or drainage.  She had missed her last appointment as she had COVID.  A1c 6.6 on 08/23/2020  Larene Beach, MD  Last seen 08/23/2020   Objective: AAO x3, NAD DP/PT pulses palpable bilaterally, CRT less than 3 seconds Nails are hypertrophic, dystrophic, brittle, discolored, elongated 10. No surrounding redness or drainage. Tenderness nails 1-5 bilaterally.  No significant hyperkeratotic tissue on the right foot today.  Left foot submetatarsal 4 is a thick, annular hyperkeratotic lesion with tenderness.  Upon debridement of this there was able to remove 2 piece of what appears to be dark hair.  There is no other foreign body identified.  There is no drainage or pus.  No fluctuance crepitation.  No malodor. No pain with calf compression, swelling, warmth, erythema  Assessment: Left foot skin lesion, foreign body; hyperkeratotic lesion right foot; dermatitis  Plan: -All treatment options discussed with the patient including all alternatives, risks, complications.  -On the left side sharp debrided the hyperkeratotic lesion.  Able to remove 2 pieces of long dark hair.  There is no further foreign body identified.  Still can obtain an x-ray to make sure no residual foreign object. -Sharply debrided the nails x10 without any complications or bleeding -On the right foot no significant callus.  Moisturizing daily.  Trula Slade DPM

## 2021-04-18 ENCOUNTER — Ambulatory Visit: Payer: Medicare Other | Admitting: Podiatry

## 2021-04-18 ENCOUNTER — Other Ambulatory Visit: Payer: Self-pay

## 2021-04-18 DIAGNOSIS — M79674 Pain in right toe(s): Secondary | ICD-10-CM

## 2021-04-18 DIAGNOSIS — M79675 Pain in left toe(s): Secondary | ICD-10-CM | POA: Diagnosis not present

## 2021-04-18 DIAGNOSIS — E1149 Type 2 diabetes mellitus with other diabetic neurological complication: Secondary | ICD-10-CM

## 2021-04-18 DIAGNOSIS — B351 Tinea unguium: Secondary | ICD-10-CM | POA: Diagnosis not present

## 2021-04-19 ENCOUNTER — Ambulatory Visit: Payer: Medicare Other | Admitting: Podiatry

## 2021-04-21 NOTE — Progress Notes (Signed)
Subjective: 70 year old female presents the office today for thick, discolored toenails that she cannot trim her self and they are causing discomfort.  Denies any open lesions.  She has no other concerns today.  A1c 7.5 on February 20, 2021  Larene Beach, MD  Last seen on 07/03/2011   Objective: AAO x3, NAD DP/PT pulses palpable bilaterally, CRT less than 3 seconds Nails are hypertrophic, dystrophic, brittle, discolored, elongated 10. No surrounding redness or drainage. Tenderness nails 1-5 bilaterally.  There is no open lesions or any significant hyperkeratotic tissue identified today.  No other areas of discomfort. No pain with calf compression, swelling, warmth, erythema  Assessment: Symptomatic onychomycosis  Plan: -All treatment options discussed with the patient including all alternatives, risks, complications.  -Sharply debrided the nails x10 without any complications or bleeding -On the right foot no significant callus.  Moisturizing daily.  Trula Slade DPM

## 2021-05-06 NOTE — Progress Notes (Signed)
[facility] Note  05/10/2021     CHIEF COMPLAINT Patient presents for Retina Follow Up   HISTORY OF PRESENT ILLNESS: Crystal [name] is a 70 y.o. female who presents to the clinic today for:   HPI     Retina Follow Up   Patient presents with  Other.  In both eyes.  This started 1 year ago.  I, the attending physician,  performed the HPI with the patient and updated documentation appropriately.        Comments   Patient here for retina follow up for PVD OU. Patient states vision may be a weaker. Harder to read and see distance. Has something on lower lid of OD. Started 1 month ago. Has pain. Only has floaters on occasion.       Last edited by Bernarda Caffey, MD on 05/12/2021  3:26 AM.    Patient states harder to read and see at distance. Has something on lower lid OD.  Referring physician: Calvert Cantor, MD [address] [place],   [address]  HISTORICAL INFORMATION:   Selected notes from the MEDICAL RECORD NUMBER Referral from Dr. Bing Plume for retina eval: - last exam by [facility], 03/17/17 - 2 wk history of floaters OU - hx of DM2 since 2003 - pseudophakia OU (complex CEIOL OD, 12.14.16, OS: 11.28.16)   CURRENT MEDICATIONS: Current Outpatient Medications (Ophthalmic Drugs)  Medication Sig   ofloxacin (OCUFLOX) 0.3 % ophthalmic solution Place 1 drop into the right eye every 4 (four) hours for 10 days.   No current facility-administered medications for this visit. (Ophthalmic Drugs)   Current Outpatient Medications (Other)  Medication Sig   albuterol (VENTOLIN HFA) 108 (90 Base) MCG/ACT inhaler Inhale into the lungs.   Alcohol Swabs (B-D SINGLE USE SWABS REGULAR) PADS Use as directed to check blood sugar. DX E11.9   atenolol (TENORMIN) 25 MG tablet Take by mouth.   atorvastatin (LIPITOR) 80 MG tablet Take 1 tablet by mouth once daily   BINAXNOW COVID-19 AG HOME TEST KIT Use as Directed on the Package   Blood Glucose  Monitoring Suppl (ONETOUCH VERIO) w/Device KIT by Does not apply route.   budesonide-formoterol (SYMBICORT) 80-4.5 MCG/ACT inhaler Inhale 2 puffs into the lungs 2 (two) times a day.   buPROPion (WELLBUTRIN XL) 150 MG 24 hr tablet Take 1 tablet by mouth daily.   cephALEXin (KEFLEX) 500 MG capsule Take 1 capsule (500 mg total) by mouth 2 (two) times daily. (Patient not taking: Reported on 05/11/2020)   cetirizine (ZYRTEC) 10 MG tablet Take 1 tablet (10 mg total) by mouth daily as needed for allergies.   Cholecalciferol (VITAMIN D3) 2000 units TABS Take 1 tablet by mouth daily.   clotrimazole (LOTRIMIN) 1 % cream Apply to affected area 2 times daily for up to 4 weeks   dextromethorphan-guaiFENesin (MUCINEX DM) 30-600 MG 12hr tablet Take 1 to 2 tablets every 12 hours for cough and chest congestion.   diclofenac (VOLTAREN) 75 MG EC tablet Take 1 tablet (75 mg total) by mouth 2 (two) times daily.   dicyclomine (BENTYL) 10 MG capsule TAKE 1 CAPSULE BY MOUTH EVERY 4 TO 6 HOURS AS NEEDED FOR ABDOMINAL PAIN AND CRAMPING OR DIARRHEA   diphenoxylate-atropine (LOMOTIL) 2.5-0.025 MG tablet Take 1 tablet by mouth every morning. If continued diarrhea in the next 2-3 days may increase to twice daily.   donepezil (ARICEPT) 10 MG tablet Take 1 tablet (10 mg total) by mouth at bedtime.  escitalopram (LEXAPRO) 20 MG tablet Take 1 tablet (20 mg total) by mouth daily.   fenofibrate 160 MG tablet TAKE 1 TABLET (160 MG TOTAL) DAILY.   fenofibrate 160 MG tablet Take 1 tablet by mouth daily.   fenofibrate 160 MG tablet Take by mouth.   fluticasone (FLOVENT HFA) 110 MCG/ACT inhaler Inhale 1-2 puffs into the lungs 2 (two) times daily.   gabapentin (NEURONTIN) 100 MG capsule Take 1 capsule (100 mg total) by mouth 3 (three) times daily. (Patient not taking: Reported on 05/11/2020)   gabapentin (NEURONTIN) 100 MG capsule Take by mouth.   glimepiride (AMARYL) 1 MG tablet Take by mouth.   glucose blood (ONETOUCH VERIO) test  strip Use as instructed to check blood sugar 2 times a day   hydrochlorothiazide (HYDRODIURIL) 25 MG tablet Take 1 tablet (25 mg total) by mouth daily.   ipratropium (ATROVENT) 0.06 % nasal spray SMARTSIG:2 Spray(s) Both Nares Morning-Night   Lancets Ultra Fine MISC Use to check blood sugar two times a day   levothyroxine (SYNTHROID) 25 MCG tablet TAKE 1 TABLET BY MOUTH ONCE DAILY BEFORE  BREAKFAST   levothyroxine (SYNTHROID) 25 MCG tablet Take by mouth.   losartan (COZAAR) 50 MG tablet Take 1 tablet by mouth once daily   losartan (COZAAR) 50 MG tablet Take 1 tablet by mouth daily.   losartan (COZAAR) 50 MG tablet Take by mouth.   montelukast (SINGULAIR) 10 MG tablet TAKE 1 TABLET BY MOUTH AT BEDTIME   montelukast (SINGULAIR) 10 MG tablet Take by mouth.   Multiple Vitamins-Minerals (CENTRUM SILVER PO) Take 1 tablet by mouth daily.     nitrofurantoin, macrocrystal-monohydrate, (MACROBID) 100 MG capsule Take 100 mg by mouth 2 (two) times daily.   oxyCODONE (OXY IR/ROXICODONE) 5 MG immediate release tablet Take 5 mg by mouth 4 (four) times daily as needed.   pantoprazole (PROTONIX) 40 MG tablet Take 1 tablet by mouth once daily   pantoprazole (PROTONIX) 40 MG tablet Take 1 tablet by mouth daily.   Probiotic Product (ALIGN) 4 MG CAPS Take 1 capsule by mouth daily. Reported on 07/02/2015   sitaGLIPtin (JANUVIA) 100 MG tablet Take 1 tablet (100 mg total) by mouth daily.   triamcinolone (KENALOG) 0.025 % ointment Apply 1 application topically daily.   valACYclovir (VALTREX) 1000 MG tablet Take 1 tablet (1,000 mg total) by mouth 3 (three) times daily.   No current facility-administered medications for this visit. (Other)   REVIEW OF SYSTEMS: ROS   Positive for: Gastrointestinal, Musculoskeletal, Endocrine, Eyes, Respiratory, Psychiatric Negative for: Constitutional, Neurological, Skin, Genitourinary, HENT, Cardiovascular, Allergic/Imm, Heme/Lymph Last edited by Theodore Demark, COA on 05/10/2021   9:26 AM.     ALLERGIES Allergies  Allergen Reactions   Compazine [Prochlorperazine Edisylate]     Comma for 3 days   Sulfa Antibiotics Itching    severe itching   Tetanus Toxoid Other (See Comments)    convulsions   Gabapentin (Once-Daily)    Metformin     Other reaction(s): GI Upset (intolerance) Extreme diarrhea   PAST MEDICAL HISTORY Past Medical History:  Diagnosis Date   Acute peptic ulcer of stomach    As a teenager   Allergy    dust, cock roach,grass,weeds,molds   Anxiety    Arthritis    knees, hands, shoulders   Asthma    02/08/10 FEV1 1.98 (80%), s/p saba 2.22 l/m (90%).nml   Broken ankle    right foot, wearing a boot   Cognitive change 06/10/2016   Dementia ([place])  Depression    Diabetes mellitus 2003   Type II   Diverticulosis    Dyspepsia    Fatty liver 12/19/2010   GERD (gastroesophageal reflux disease)    Hearing loss    bilateral hearing aids    Herpes simplex virus (HSV) infection    High cholesterol    Hyperlipemia    Hypertension    Hypothyroidism 09/08/2013   IBS (irritable bowel syndrome)    Insomnia    Internal hemorrhoids    Joint pain 08/08/2016   Low back pain 08/10/2016   OSA (obstructive sleep apnea)    Does not use CPAP - old one broken, new sleep study to be scheduled and then given a new CPAP machine   Plantar fasciitis    no longer an issue   Sun-damaged skin 12/09/2016   SVD (spontaneous vaginal delivery)    x 4   Vaginal Pap smear, abnormal    Vitamin D deficiency 03/04/2016   Wears partial dentures    upper dentures and lower partial   Past Surgical History:  Procedure Laterality Date   APPENDECTOMY     age 32   CATARACT EXTRACTION Bilateral 2016   Dr. Bing Plume   CATARACT EXTRACTION, BILATERAL     12/8 and 12/14   COLONOSCOPY  06/03/2010, 08/25/2011    severeal - polyps and diverticulosis    DILATATION & CURETTAGE/HYSTEROSCOPY WITH MYOSURE N/A 04/01/2018   Procedure: DILATATION & CURETTAGE/HYSTEROSCOPY WITH MYOSURE  POLYPECTOMY;  Surgeon: Emily Filbert, MD;  Location: [facility];  Service: Gynecology;  Laterality: N/A;   EYE SURGERY     cateracts removed bilateral   GASTRECTOMY     partial - ulcer as a teen   HAND SURGERY Right    thumb    HEMORRHOID BANDING     KNEE ARTHROSCOPY Left 1998   NASAL SEPTUM SURGERY     RHINOPLASTY     TOE SURGERY Right 1995   FAMILY HISTORY Family History  Problem Relation Age of Onset   Diabetes Mother        type II   Heart attack Mother 77   Stroke Mother    Hyperlipidemia Mother    Colon cancer Sister    Cancer Sister        GI   Heart disease Sister    Asthma Maternal Grandmother    Mental illness Son        PTSD from [place]   Parkinsonism Father    Cancer Sister        stage 4 bladder cancer   Colon polyps Sister    Ulcers Other        bleeding gastric   Heart defect Other        child   Other Other        perforated bowel, child   Cancer Brother        pancreatic cancer   Amblyopia Neg Hx    Blindness Neg Hx    Cataracts Neg Hx    Glaucoma Neg Hx    Macular degeneration Neg Hx    Retinal detachment Neg Hx    Strabismus Neg Hx    Retinitis pigmentosa Neg Hx    SOCIAL HISTORY Social History   Tobacco Use   Smoking status: Never   Smokeless tobacco: Never  Vaping Use   Vaping Use: Never used  Substance Use Topics   Alcohol use: Yes    Alcohol/week: 0.0 standard drinks    Comment: Rare occasions  Drug use: No       OPHTHALMIC EXAM: Base Eye Exam     Visual Acuity (Snellen - Linear)       Right Left   Dist cc 20/30 20/20 -1   Dist ph cc 20/25 -2          Tonometry (Tonopen, 9:21 AM)       Right Left   Pressure 14 13         Pupils       Dark Light Shape React APD   Right 4 3 Irregular Minimal None   Left 3 2 Round Brisk None         Neuro/Psych     Oriented x3: Yes   Mood/Affect: Normal         Dilation     Both eyes: 1.0% Mydriacyl, 2.5% Phenylephrine @ 9:21 AM           Slit Lamp and  Fundus Exam     External Exam       Right Left   External Normal Normal         Slit Lamp Exam       Right Left   Lids/Lashes Dermatochalasis - upper lid, erythemetous hordeolum, telangiactasias Dermatochalasis - upper lid temporally, Meibomian gland dysfunction   Conjunctiva/Sclera White and quiet White and quiet   Cornea trace Punctate epithelial erosions, mild endo pigment, well healed temporal cataract wound  Well healed cataract wounds   Anterior Chamber Deep and clear, mild vitreous prolapse temporally Deep and quiet   Iris Slightly irregular -- mild peaking temporally, TID at temporal pupil margin Round and dilated; PPM at 0600    Lens 3 piece PC IOL in good position with open PC, mild vitreous prolapse around temporal lens edge Posterior chamber intraocular lens - good position   Vitreous Vitreous syneresis Vitreous syneresis; Posterior vitreous detachment         Fundus Exam       Right Left   Disc Pink and sharp, temporal PPP, +cupping +cupping, mild tilt, PPP, Pink and Sharp   C/D Ratio 0.65 0.7   Macula Flat, Blunted foveal reflex, mild Epiretinal membrane, mild RPE mottling Flat, blunted foveal reflex, Retinal pigment epithelial mottling, No heme or edema   Vessels Vascular attenuation, Tortuous, AV crossing changes Vascular attenuation - mild   Periphery flat; attached; trace RPE changes; hair line Retinal tears at 3 and 4 o'clock w/ good laser scars surrounding -- basically ora; peripheral cystoid degeneration; ST cobblestoning, No new RT/RD Flat; attached; trace RPE changes, peripheral cystoid degneration           Refraction     Wearing Rx       Sphere Cylinder Axis Add   Right -1.25 +0.75 088 +2.50   Left -1.25 +0.75 110 +2.50    Type: PAL            IMAGING AND PROCEDURES  Imaging and Procedures for 04/30/17  OCT, Retina - OU - Both Eyes       Right Eye Quality was good. Central Foveal Thickness: 313. Progression has been stable.  Findings include epiretinal membrane, no IRF, no SRF, normal foveal contour (Persistent ERM--no significant change from prior).   Left Eye Quality was good. Central Foveal Thickness: 277. Progression has been stable. Findings include normal foveal contour, no IRF, no SRF.   Notes Images taken, stored on drive   Diagnosis / Impression:  OD - NFP; no IRF/SRF; mild persistent ERM OS -  NFP; no IRF/SRF  Clinical management:  See below  Abbreviations: NFP - Normal foveal profile. CME - cystoid macular edema. PED - pigment epithelial detachment. IRF - intraretinal fluid. SRF - subretinal fluid. EZ - ellipsoid zone. ERM - epiretinal membrane. ORA - outer retinal atrophy. ORT - outer retinal tubulation. SRHM - subretinal hyper-reflective material                ASSESSMENT/PLAN:    ICD-10-CM   1. 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 Posterior vitreous detachment of both eyes  H43.813     3. Type 2 diabetes mellitus without complication, without long-term current use of insulin (HCC)  E11.9     4. Pseudophakia of both eyes  Z96.1     5. CME (cystoid macular edema), right  H35.351     6. Retinal edema  H35.81 OCT, Retina - OU - Both Eyes    7. Epiretinal membrane (ERM) of right eye  H35.371     8. Hordeolum externum of right eye, unspecified eyelid  H00.013 ofloxacin (OCUFLOX) 0.3 % ophthalmic solution      1. Peripheral retinal tears OD  - Small flap tears at ora, 0300 and 0400 oclock  - s/p barrier laser retinopexy OD, 03/18/17 -- good laser in place  - stable; No new RT/RD - pt is cleared from a retina standpoint for release to [facility] and resumption of primary eye care  2. PVD / vitreous syneresis OU  - Discussed findings and prognosis  - No RT or RD on 360 scleral depressed exam OS  - Reviewed s/s of RT/RD  - Strict return precautions for any such RT/RD signs/symptoms   3. DM2 without retinopathy  - On oral meds  - Discussed  importance of tight BS and BP control  4. Pseudophakia OU  - IOLs in good position OU -- beautiful surgeries by Dr. Bing Plume  - OD with history of anterior vitrectomy and 3 piece sulcus IOL  - monitor   5,6. CME OD  - Remains resolved now since 12.20.19 -- was on PF and ketorolac  - Etiology could be multifactorial -- ERM, delayed post-cataract  - completed PF taper   - monitor   7. Epiretinal membrane, right eye  - The natural history, anatomy, potential for loss of vision, and treatment options including vitrectomy techniques and the complications of endophthalmitis, retinal detachment, vitreous hemorrhage, cataract progression and permanent vision loss discussed with the patient. - very mild ERM - asymptomatic, no metamorphopsia - no indication for surgery at this time - monitor  8. Hordeolum OD -- R lower lid  - recommend hot compresses to lower lid BID  - start ofloxacin qid OD  - recommend f/u with [facility] if lesion fails to improve  Ophthalmic Meds Ordered this visit:  Meds ordered this encounter  Medications   ofloxacin (OCUFLOX) 0.3 % ophthalmic solution    Sig: Place 1 drop into the right eye every 4 (four) hours for 10 days.    Dispense:  5 mL    Refill:  0      Return if symptoms worsen or fail to improve.  There are no Patient Instructions on file for this visit.   Explained the diagnoses, plan, and follow up with the patient and they expressed understanding.  Patient expressed understanding of the importance of proper follow up care.   This document serves as a record of services personally performed by Aaron Edelman  Antony Haste, MD, PhD. It was created on their behalf by Leonie Douglas, an ophthalmic technician. The creation of this record is the provider's dictation and/or activities during the visit.    Electronically signed by: Leonie Douglas COA, 05/12/21  3:27 AM  This document serves as a record of services personally performed by Gardiner Sleeper, MD, PhD. It  was created on their behalf by Roselee Nova, COMT. The creation of this record is the provider's dictation and/or activities during the visit.  Electronically signed by: Roselee Nova, COMT 05/12/21 3:27 AM  Gardiner Sleeper, M.D., Ph.D. Diseases & Surgery of the Retina and [place] 05/10/2021  I have reviewed the above documentation for accuracy and completeness, and I agree with the above. Gardiner Sleeper, M.D., Ph.D. 05/12/21 3:40 AM  Abbreviations: M myopia (nearsighted); A astigmatism; H hyperopia (farsighted); P presbyopia; Mrx spectacle prescription;  CTL contact lenses; OD right eye; OS left eye; OU both eyes  XT exotropia; ET esotropia; PEK punctate epithelial keratitis; PEE punctate epithelial erosions; DES dry eye syndrome; MGD meibomian gland dysfunction; ATs artificial tears; PFAT's preservative free artificial tears; [place] nuclear sclerotic cataract; PSC posterior subcapsular cataract; ERM epi-retinal membrane; PVD posterior vitreous detachment; RD retinal detachment; DM diabetes mellitus; DR diabetic retinopathy; NPDR non-proliferative diabetic retinopathy; PDR proliferative diabetic retinopathy; CSME clinically significant macular edema; DME diabetic macular edema; dbh dot blot hemorrhages; CWS cotton wool spot; POAG primary open angle glaucoma; C/D cup-to-disc ratio; HVF humphrey visual field; GVF goldmann visual field; OCT optical coherence tomography; IOP intraocular pressure; BRVO Branch retinal vein occlusion; CRVO central retinal vein occlusion; CRAO central retinal artery occlusion; BRAO branch retinal artery occlusion; RT retinal tear; SB scleral buckle; PPV pars plana vitrectomy; VH Vitreous hemorrhage; PRP panretinal laser photocoagulation; IVK intravitreal kenalog; VMT vitreomacular traction; MH Macular hole;  NVD neovascularization of the disc; NVE neovascularization elsewhere; AREDS age related eye disease study; ARMD age related macular  degeneration; POAG primary open angle glaucoma; EBMD epithelial/anterior basement membrane dystrophy; ACIOL anterior chamber intraocular lens; IOL intraocular lens; PCIOL posterior chamber intraocular lens; Phaco/IOL phacoemulsification with intraocular lens placement; [place] photorefractive keratectomy; LASIK laser assisted in situ keratomileusis; HTN hypertension; DM diabetes mellitus; COPD chronic obstructive pulmonary disease

## 2021-05-10 ENCOUNTER — Encounter (INDEPENDENT_AMBULATORY_CARE_PROVIDER_SITE_OTHER): Payer: Self-pay | Admitting: Ophthalmology

## 2021-05-10 ENCOUNTER — Ambulatory Visit (INDEPENDENT_AMBULATORY_CARE_PROVIDER_SITE_OTHER): Payer: Medicare Other | Admitting: Ophthalmology

## 2021-05-10 ENCOUNTER — Other Ambulatory Visit: Payer: Self-pay

## 2021-05-10 DIAGNOSIS — H43813 Vitreous degeneration, bilateral: Secondary | ICD-10-CM

## 2021-05-10 DIAGNOSIS — H35351 Cystoid macular degeneration, right eye: Secondary | ICD-10-CM | POA: Diagnosis not present

## 2021-05-10 DIAGNOSIS — H00013 Hordeolum externum right eye, unspecified eyelid: Secondary | ICD-10-CM

## 2021-05-10 DIAGNOSIS — H33331 Multiple defects of retina without detachment, right eye: Secondary | ICD-10-CM | POA: Diagnosis not present

## 2021-05-10 DIAGNOSIS — H35371 Puckering of macula, right eye: Secondary | ICD-10-CM | POA: Diagnosis not present

## 2021-05-10 DIAGNOSIS — Z961 Presence of intraocular lens: Secondary | ICD-10-CM | POA: Diagnosis not present

## 2021-05-10 DIAGNOSIS — H3581 Retinal edema: Secondary | ICD-10-CM

## 2021-05-10 DIAGNOSIS — E119 Type 2 diabetes mellitus without complications: Secondary | ICD-10-CM

## 2021-05-10 MED ORDER — OFLOXACIN 0.3 % OP SOLN: 1.0000 [drp] | OPHTHALMIC | 0 refills | Status: AC

## 2021-05-12 ENCOUNTER — Encounter (INDEPENDENT_AMBULATORY_CARE_PROVIDER_SITE_OTHER): Payer: Self-pay | Admitting: Ophthalmology

## 2021-07-25 ENCOUNTER — Other Ambulatory Visit: Payer: Self-pay

## 2021-07-25 ENCOUNTER — Ambulatory Visit (INDEPENDENT_AMBULATORY_CARE_PROVIDER_SITE_OTHER): Payer: Medicare HMO | Admitting: Podiatry

## 2021-07-25 DIAGNOSIS — E1149 Type 2 diabetes mellitus with other diabetic neurological complication: Secondary | ICD-10-CM

## 2021-07-25 DIAGNOSIS — M2041 Other hammer toe(s) (acquired), right foot: Secondary | ICD-10-CM

## 2021-07-25 DIAGNOSIS — Q828 Other specified congenital malformations of skin: Secondary | ICD-10-CM

## 2021-07-25 DIAGNOSIS — M2042 Other hammer toe(s) (acquired), left foot: Secondary | ICD-10-CM

## 2021-07-29 NOTE — Progress Notes (Signed)
Subjective: 71 year old female presents the office today for painful calluses to her feet.  Denies any open sores no swelling redness or drainage.  She does not get her nails trimmed today she recently got a pedicure.  A1c 7.5 on February 20, 2021 Last BS- 163  Crystal Beach, MD  Last seen on 07/03/2011   Objective: AAO x3, NAD DP/PT pulses palpable bilaterally, CRT less than 3 seconds Hyperkeratotic lesions right foot submetatarsal x2 without any underlying ulceration drainage or any signs of infection.  No significant callus formation on the left foot today.  No open lesions bilaterally. Hammertoes present  No pain with calf compression, swelling, warmth, erythema  Assessment: Symptomatic hyperkeratotic lesions, type 2 diabetes with neuropathy.  Plan: -All treatment options discussed with the patient including all alternatives, risks, complications.  -Sharply debrided the hyperkeratotic lesion x2 without any complications or bleeding -Daily foot inspection. -Follow-up for diabetic shoes.   Trula Slade DPM

## 2021-08-01 ENCOUNTER — Other Ambulatory Visit: Payer: Self-pay

## 2021-08-01 ENCOUNTER — Ambulatory Visit: Payer: Medicare HMO

## 2021-08-01 DIAGNOSIS — M2041 Other hammer toe(s) (acquired), right foot: Secondary | ICD-10-CM

## 2021-08-01 DIAGNOSIS — E1149 Type 2 diabetes mellitus with other diabetic neurological complication: Secondary | ICD-10-CM

## 2021-08-01 DIAGNOSIS — M2042 Other hammer toe(s) (acquired), left foot: Secondary | ICD-10-CM

## 2021-08-01 NOTE — Progress Notes (Signed)
SITUATION Reason for Visit: Dispensation of Diabetic Certification Paperwork Patient Report:  Patient/Caregiver understands their treating physician must complete the forms in order to be evaluated for diabetic shoes. Patient did not want to be casted at this time, but start the paperwork process.  OBJECTIVE DATA Relevant Diagnoses Provided by Podiatrist:   ICD-10-CM   1. Type II diabetes mellitus with neurological manifestations ([place])  E11.49     2. Hammertoes of both feet  M20.41    M20.42       Current or Previous Devices: Diabetic shoes obtained more than one year ago  Treating Physician:  Hermine Messick, MD  ACTIONS PERFORMED Discussed with Patient the Medicare mandated diabetic certification paperwork. Confirmed patient's treating physician and diagnoses. Provided paperwork for Patient to present to patient's treating physician. Patient understands all instructions. All questions were answered and concerns were addressed.  PLAN Patient is to present paperwork to treating physician and return the paperwork to [facility] to be scheduled for evaluation for diabetic shoes and insoles.

## 2021-08-15 ENCOUNTER — Encounter: Payer: Self-pay | Admitting: Internal Medicine

## 2021-10-24 ENCOUNTER — Ambulatory Visit: Payer: Medicare HMO | Admitting: Podiatry

## 2021-11-01 ENCOUNTER — Ambulatory Visit (INDEPENDENT_AMBULATORY_CARE_PROVIDER_SITE_OTHER): Payer: Medicare HMO | Admitting: Podiatry

## 2021-11-01 ENCOUNTER — Encounter: Payer: Self-pay | Admitting: Podiatry

## 2021-11-01 DIAGNOSIS — Q828 Other specified congenital malformations of skin: Secondary | ICD-10-CM

## 2021-11-01 DIAGNOSIS — E1149 Type 2 diabetes mellitus with other diabetic neurological complication: Secondary | ICD-10-CM

## 2021-11-04 NOTE — Progress Notes (Signed)
Subjective: ?71 year old female presents the office today for painful calluses to her feet.  Denies any open sores no swelling redness or drainage.  No open sores that she reports. ? ?A1c 7.5 on February 20, 2021 ? ?Larene Beach, MD ? ? ?Objective: ?AAO x3, NAD ?DP/PT pulses palpable bilaterally, CRT less than 3 seconds ?Hyperkeratotic lesions right foot submetatarsal x2 without any underlying ulceration drainage or any signs of infection.  No significant callus formation on the left foot today.  No open lesions bilaterally. ?Hammertoes present  ?No pain with calf compression, swelling, warmth, erythema ? ?Assessment: ?Symptomatic hyperkeratotic lesions, type 2 diabetes with neuropathy. ? ?Plan: ?-All treatment options discussed with the patient including all alternatives, risks, complications.  ?-Sharply debrided the hyperkeratotic lesion x2 without any complications or bleeding ?-Daily foot inspection. ?-Follow-up for diabetic shoes.  ? ?Trula Slade DPM ? ?

## 2021-12-10 ENCOUNTER — Ambulatory Visit: Payer: Medicare HMO | Admitting: Podiatry

## 2021-12-10 ENCOUNTER — Ambulatory Visit (INDEPENDENT_AMBULATORY_CARE_PROVIDER_SITE_OTHER): Payer: Medicare HMO

## 2021-12-10 DIAGNOSIS — M2041 Other hammer toe(s) (acquired), right foot: Secondary | ICD-10-CM | POA: Diagnosis not present

## 2021-12-10 DIAGNOSIS — R2681 Unsteadiness on feet: Secondary | ICD-10-CM | POA: Diagnosis not present

## 2021-12-10 DIAGNOSIS — M2021 Hallux rigidus, right foot: Secondary | ICD-10-CM

## 2021-12-10 DIAGNOSIS — M2042 Other hammer toe(s) (acquired), left foot: Secondary | ICD-10-CM

## 2021-12-10 DIAGNOSIS — R0989 Other specified symptoms and signs involving the circulatory and respiratory systems: Secondary | ICD-10-CM

## 2021-12-11 NOTE — Progress Notes (Signed)
Subjective: 71 year old female presents the office today with concerns of a hammertoe which is overlapping her big toe on her right foot which is leading to gait instability and likely causing falls that she believes.  She denies any recent treatment for this.  No specific injury to the toes.  She has a bunion as well in the right foot which occasionally causes discomfort.  Objective: AAO x3, NAD DP/PT pulses palpable bilaterally, CRT less than 3 seconds There is decreased range of motion of right first MPJ.  There is a hammertoe contracture present second toe and starting to open up the hallux.  Significant tenderness of the second toe as well.  No other areas of discomfort noted.  No open lesions. No pain with calf compression, swelling, warmth, erythema  Assessment: Hammertoe, overlapping toe right foot with hallux rigidus; gait instability  Plan: -All treatment options discussed with the patient including all alternatives, risks, complications.  -X-rays obtained reviewed of the right foot.  3 views were obtained.  Arthritic changes present the first MPJ with hammertoe contracture present of the second digit.  No evidence of acute fracture. -We discussed both conservative as well as surgical options.  Conservative discussed offloading pads and shoe modifications.  Surgically we discussed first imaging arthrodesis with second metatarsal osteotomy and hammertoe repair.  After discussion she wants to think about the surgery 1 continue with conservative care for now.  Consider physical therapy for gait training. -BPA for ABI came up so this was ordered. -Patient encouraged to call the office with any questions, concerns, change in symptoms.   Trula Slade DPM

## 2022-01-17 ENCOUNTER — Ambulatory Visit (HOSPITAL_COMMUNITY)
Admission: RE | Admit: 2022-01-17 | Discharge: 2022-01-17 | Disposition: A | Discharge status: Home / Self Care | Payer: Medicare HMO | Source: Ambulatory Visit | Attending: Podiatry | Admitting: Podiatry

## 2022-01-17 DIAGNOSIS — M79672 Pain in left foot: Secondary | ICD-10-CM

## 2022-01-17 DIAGNOSIS — R0989 Other specified symptoms and signs involving the circulatory and respiratory systems: Secondary | ICD-10-CM | POA: Diagnosis not present

## 2022-01-21 ENCOUNTER — Ambulatory Visit: Payer: Medicare HMO

## 2022-01-21 ENCOUNTER — Ambulatory Visit: Payer: Medicare HMO | Admitting: Podiatry

## 2022-01-21 ENCOUNTER — Ambulatory Visit (INDEPENDENT_AMBULATORY_CARE_PROVIDER_SITE_OTHER): Payer: Medicare HMO

## 2022-01-21 DIAGNOSIS — B351 Tinea unguium: Secondary | ICD-10-CM

## 2022-01-21 DIAGNOSIS — M778 Other enthesopathies, not elsewhere classified: Secondary | ICD-10-CM

## 2022-01-21 DIAGNOSIS — M79675 Pain in left toe(s): Secondary | ICD-10-CM | POA: Diagnosis not present

## 2022-01-21 DIAGNOSIS — E1149 Type 2 diabetes mellitus with other diabetic neurological complication: Secondary | ICD-10-CM

## 2022-01-21 DIAGNOSIS — M79672 Pain in left foot: Secondary | ICD-10-CM

## 2022-01-21 DIAGNOSIS — M79674 Pain in right toe(s): Secondary | ICD-10-CM

## 2022-01-21 DIAGNOSIS — M7752 Other enthesopathy of left foot: Secondary | ICD-10-CM | POA: Diagnosis not present

## 2022-01-21 NOTE — Patient Instructions (Signed)
Walking Boot, Adult  A walking boot holds your foot or ankle in place after an injury or a medical procedure. This helps with healing and prevents further injury. It has a hard, rigid outer frame that limits movement and supports your foot and leg. The inner lining is a layer of padded material. Walking boots also have adjustable straps to secure them over the foot and leg. A walking boot may be prescribed if you can put weight (bear weight) on your injured foot. How much you can walk while wearing the boot will depend on the type and severity of your injury. How to put on a walking boot There are different types of walking boots. Each type has specific instructions about how to wear it properly. Follow instructions from your health care provider, such as: Ask someone to help you put on the boot, if needed. Sit to put on your boot. Doing this is more comfortable and helps to prevent falls. Open up the boot fully. Place your foot into the boot so your heel rests against the back. Your toes should be supported by the base of the boot. They should not hang over the front edge. Adjust the straps so the boot fits securely but is not too tight. Do not bend the hard frame of the boot to get a good fit. How to walk with a walking boot How much you can walk will depend on your injury. Some tips for managing with a boot include: Do not try to walk without wearing the boot unless your health care provider approves. Use other assistive walking devices, including crutches or canes, as told by your health care provider. On your uninjured foot, wear a shoe with a heel that is close to the height of the walking boot. Be careful when walking on surfaces that are uneven or wet. How to reduce swelling while using a walking boot  Rest your injured foot or leg as much as possible. If directed, put ice on the injured area. To do this: Put ice in a plastic bag. Place a towel between your skin and the bag. Leave the  ice on for 20 minutes, 2-3 times a day. Remove the ice if your skin turns bright red. This is very important. If you cannot feel pain, heat, or cold, you have a greater risk of damage to the area. Keep your injured foot or leg raised (elevated) above the level of your heart for at least 2?3 hours each day or as told by your health care provider. If swelling gets worse, loosen the boot. Rest and elevate your foot and leg. How to care for your skin and foot while using a walking boot Wear a long sock to protect your foot and leg from rubbing inside the boot. Take off the boot one time each day to check the injured area. Check your foot, the surrounding skin, and your leg to make sure there are no sores, rashes, swelling, or wounds. The skin should be a healthy color, not pale or blue. Try to notice if your walking pattern (gait) in the boot is fairly normal and that you are walking without a noticeable limp. Follow instructions from your health care provider about taking care of your incision or wound, if this applies. Clean and wash the injured area as told by your health care provider. Gently dry your foot and leg before putting the boot back on. Removing your walking boot Follow directions from your health care provider for removing the   walking boot. Generally, it is okay to remove your walking boot: When you are resting or sleeping. To clean your foot and leg. How to keep the walking boot clean Do not put any part of the boot in a washing machine or dryer. Do not use chemical cleaning products. These could irritate your skin, especially if you have a wound or an incision. Do not soak the liner of the boot. Use a washcloth with mild soap and water to clean the frame and the liner of the boot by hand. Allow the boot to air-dry completely before you put it back on your foot. Follow these instructions at home: Activity Your activity will be restricted depending on the type and severity of your  injury. Follow instructions from your health care provider. Also: Bathe and shower as told by your health care provider. Do not do any activities that could make your injury worse. Do not drive if your affected foot is the one that you use for driving. Contact a health care provider if: The boot is cracked or damaged. The boot does not fit properly. Your foot or leg hurts. You have a rash, sore, or open sore (ulcer) on your foot or leg. The skin on your foot or leg is pale. You have a wound or incision on the foot and it is getting worse. Your skin becomes painful, red, or irritated. Your swelling does not get better or it gets worse. Get help right away if: You have numbness in your foot or leg. The skin on your foot or leg is cold, blue, or gray. Summary A walking boot holds your foot or ankle in place after an injury or a medical procedure. There are different types of walking boots. Follow instructions about how to correctly wear your boot. Ask someone to help you put on the boot, if needed. It is important to check your skin and foot every day. Call your health care provider if you notice a rash or sore on your foot or leg. This information is not intended to replace advice given to you by your health care provider. Make sure you discuss any questions you have with your health care provider. Document Revised: 04/23/2020 Document Reviewed: 04/23/2020 [employer] Patient Education  2023 [employer]  

## 2022-01-24 NOTE — Progress Notes (Signed)
Subjective: 71 year old female presents the office today for concerns of left ankle, foot pain.  She is not sure what she did.  She states that she does a lot of walking and she may have rolled her foot but she is not sure.  She is noticed pain and swelling has difficulty moving her foot.  Also the nails are thickened elongated she is having discomfort the nails and asking for the be trimmed today.  No swelling redness or any drainage.  No open lesions.   Objective: AAO x3, NAD DP/PT pulses palpable bilaterally, CRT less than 3 seconds There is tenderness palpation mostly in the course of the peroneal tendon posterior to lateral malleolus curving towards the fifth metatarsal base.  Mild discomfort along the sinus tarsi, lateral ankle complex there is no area pinpoint tenderness noted.  Range of motion, MMT decreased on the left side due to pain. Nails are hypertrophic, dystrophic, brittle, discolored, elongated 10. No surrounding redness or drainage. Tenderness nails 1-5 bilaterally. No open lesions or pre-ulcerative lesions are identified today.= No pain with calf compression, swelling, warmth, erythema  Assessment: 71 year old female with tendinitis, capsulitis left ankle; symptomatic onychomycosis  Plan: -All treatment options discussed with the patient including all alternatives, risks, complications.  -X-rays obtained reviewed.  3 views of the ankle, foot were obtained.  There is no evidence of acute fracture noted. -Given the discomfort recommend mobilization in the cam boot was dispensed for this.  Compression, elevation.  Anti-inflammatories as needed -Sharply debrided nails x10 without any complications or bleeding. -Reviewed arterial studies. -Patient encouraged to call the office with any questions, concerns, change in symptoms.   Crystal [name] DPM

## 2022-02-03 ENCOUNTER — Ambulatory Visit: Payer: Medicare HMO | Admitting: Podiatry

## 2022-02-07 ENCOUNTER — Other Ambulatory Visit: Payer: Self-pay | Admitting: Podiatry

## 2022-02-07 DIAGNOSIS — M778 Other enthesopathies, not elsewhere classified: Secondary | ICD-10-CM

## 2022-02-11 ENCOUNTER — Ambulatory Visit: Payer: Medicare HMO | Admitting: Podiatry

## 2022-02-11 DIAGNOSIS — M7752 Other enthesopathy of left foot: Secondary | ICD-10-CM

## 2022-02-11 DIAGNOSIS — E1149 Type 2 diabetes mellitus with other diabetic neurological complication: Secondary | ICD-10-CM | POA: Diagnosis not present

## 2022-02-11 DIAGNOSIS — M778 Other enthesopathies, not elsewhere classified: Secondary | ICD-10-CM

## 2022-02-11 NOTE — Progress Notes (Signed)
Subjective: 71 year old female presents the office today for follow-up evaluation of left ankle, foot pain.  That she is doing much better.  She still wearing the cam boot the majority times but she does go without it at nighttime after walking and she states her foot feels fine not having any significant pain.  The swelling is also much improved.  No new injuries or concerns otherwise.  Also asking the nails and calluses be trimmed today as are causing discomfort.     Objective: AAO x3, NAD DP/PT pulses palpable bilaterally, CRT less than 3 seconds Today there is no significant tenderness palpation the course the peroneal tendon.  Slight discomfort but no significant pain along the fifth metatarsal base just proximal to this area on the insertion.  Clinically the tendon appears to be intact.  No other areas of pinpoint tenderness.  There is minimal edema there is no erythema or warmth. Nails mildly elongated.  Minimal hyperkeratotic tissue right foot without any underlying ulceration drainage or signs of infection. No pain with calf compression, swelling, warmth, erythema  Assessment: 71 year old female with tendinitis, capsulitis left ankle; symptomatic onychomycosis  Plan: -All treatment options discussed with the patient including all alternatives, risks, complications.  -Clinically she is doing much better.  Discussed gradual transition to regular shoe as tolerated.  If there is any increasing pain or discomfort recommended to return to the cam boot let me know.  Continue ice as well. -As a courtesy Sharp debride the nails and calluses without any complications or bleeding.  Return in about 9 weeks (around 04/15/2022).  Trula Slade DPM

## 2022-02-19 DIAGNOSIS — I700 Atherosclerosis of aorta: Secondary | ICD-10-CM | POA: Insufficient documentation

## 2022-02-19 DIAGNOSIS — M8580 Other specified disorders of bone density and structure, unspecified site: Secondary | ICD-10-CM | POA: Insufficient documentation

## 2022-02-19 DIAGNOSIS — E1169 Type 2 diabetes mellitus with other specified complication: Secondary | ICD-10-CM | POA: Insufficient documentation

## 2022-02-19 DIAGNOSIS — H9190 Unspecified hearing loss, unspecified ear: Secondary | ICD-10-CM | POA: Insufficient documentation

## 2022-03-03 DIAGNOSIS — F331 Major depressive disorder, recurrent, moderate: Secondary | ICD-10-CM | POA: Insufficient documentation

## 2022-04-15 ENCOUNTER — Ambulatory Visit (INDEPENDENT_AMBULATORY_CARE_PROVIDER_SITE_OTHER): Payer: Medicare HMO | Admitting: Podiatry

## 2022-04-15 DIAGNOSIS — M778 Other enthesopathies, not elsewhere classified: Secondary | ICD-10-CM | POA: Diagnosis not present

## 2022-04-15 DIAGNOSIS — Q828 Other specified congenital malformations of skin: Secondary | ICD-10-CM | POA: Diagnosis not present

## 2022-04-15 DIAGNOSIS — M7752 Other enthesopathy of left foot: Secondary | ICD-10-CM

## 2022-04-20 NOTE — Progress Notes (Signed)
Subjective: Chief Complaint  Patient presents with   Diabetes    Diabetic foot care, nail trim, still having pain in the left foot     71 year old female presents the office today for follow-up evaluation of left ankle, foot pain.  States that she still gets intermittent pain in her left foot but she is not having any pain today.  No recent injury or change otherwise.  No swelling.  She recently got a pedicure for her nails.  She would like the calluses checked.  No open sores.  Last A1c January 20, 2022 was 7.5  PCP: Crystal Messick, MD   Objective: AAO x3, NAD DP/PT pulses palpable bilaterally, CRT less than 3 seconds On the ankle as well as any area pinpoint tenderness.  Also there is no tenderness on the course or insertion of the peroneal tendon.  Flexor, extensor tendons appear to be intact.  The nails are mildly elongated.  There is minimal hyperkeratotic tissue on the right foot without any underlying ulceration drainage or signs of infection.  No open lesions.  MMT 5/5. No pain with calf compression, swelling, warmth, erythema  Assessment: 71 year old female with tendinitis, capsulitis left ankle; symptomatic onychomycosis  Plan: -All treatment options discussed with the patient including all alternatives, risks, complications.  -Clinically she is doing much better.  Not able to elicit any pain today.  Continue home stretching, rehab exercises as well as continued shoes and good arch support. -As a courtesy debride the nails she recently got a pedicure and they were not significantly elongated at this even them up and smoothed them without any complications or bleeding. -Slight callus.  Lately debrided this without any complications or bleeding and continue moisturizer.  Trula Slade DPM

## 2022-06-09 DIAGNOSIS — S62025D Nondisplaced fracture of middle third of navicular [scaphoid] bone of left wrist, subsequent encounter for fracture with routine healing: Secondary | ICD-10-CM | POA: Insufficient documentation

## 2022-06-09 DIAGNOSIS — S52502A Unspecified fracture of the lower end of left radius, initial encounter for closed fracture: Secondary | ICD-10-CM | POA: Insufficient documentation

## 2022-06-24 ENCOUNTER — Ambulatory Visit: Payer: Self-pay | Admitting: Podiatry

## 2022-06-27 DIAGNOSIS — J3489 Other specified disorders of nose and nasal sinuses: Secondary | ICD-10-CM | POA: Insufficient documentation

## 2022-07-22 ENCOUNTER — Ambulatory Visit: Payer: Medicare PPO | Admitting: Podiatry

## 2022-07-22 DIAGNOSIS — E1149 Type 2 diabetes mellitus with other diabetic neurological complication: Secondary | ICD-10-CM

## 2022-07-22 DIAGNOSIS — M79675 Pain in left toe(s): Secondary | ICD-10-CM | POA: Diagnosis not present

## 2022-07-22 DIAGNOSIS — Q828 Other specified congenital malformations of skin: Secondary | ICD-10-CM | POA: Diagnosis not present

## 2022-07-22 DIAGNOSIS — B351 Tinea unguium: Secondary | ICD-10-CM

## 2022-07-22 DIAGNOSIS — M79674 Pain in right toe(s): Secondary | ICD-10-CM

## 2022-07-22 NOTE — Progress Notes (Signed)
Subjective: Chief Complaint  Patient presents with   diabetic foot care     72 year old female presents the office today for thick, elongated nails that she has not noticed herself.  She states that she has had left wrist fracture and she is not able to trim her nails causing pain.  She also gets.  Callus on the right foot no other lesions that she reports.  No injuries to the lower extremities.  Last A1c January 20, 2022 was 7.5  PCP: Hermine Messick, MD Last seen: 01/20/2022   Objective: AAO x3, NAD DP/PT pulses palpable bilaterally, CRT less than 3 seconds Nails are hypertrophic, dystrophic, brittle, discolored, elongated 10. No surrounding redness or drainage. Tenderness nails 1-5 bilaterally.  Hyperkeratotic lesions right foot submetatarsal 2 and 3 without any underlying ulceration drainage or signs of infection.  There is no open lesions. No pain with calf compression, swelling, warmth, erythema  Assessment: 72 year old female with hyperkeratotic lesions, symptomatic onychomycosis  Plan: -All treatment options discussed with the patient including all alternatives, risks, complications.  -Sharply debrided hyperkeratotic lesions x 2 without any complications or bleeding.  Continue moisturizer, offloading. -Sharply debrided nails x 10 without any complications or bleeding -Daily foot inspection.  Trula Slade DPM

## 2022-10-01 DIAGNOSIS — D8989 Other specified disorders involving the immune mechanism, not elsewhere classified: Secondary | ICD-10-CM | POA: Insufficient documentation

## 2022-10-21 ENCOUNTER — Ambulatory Visit: Payer: Medicare HMO | Admitting: Podiatry

## 2022-10-21 DIAGNOSIS — E1149 Type 2 diabetes mellitus with other diabetic neurological complication: Secondary | ICD-10-CM

## 2022-10-21 DIAGNOSIS — Q828 Other specified congenital malformations of skin: Secondary | ICD-10-CM

## 2022-10-21 NOTE — Patient Instructions (Signed)

## 2022-10-21 NOTE — Progress Notes (Signed)
Subjective: Chief Complaint  Patient presents with   Diabetes    Nail care     72 year old female presents the office today for medical evaluation.  She said that she is doing well.  Little bit of issue with a callus on the right foot.  No open lesions that she reports.  She recently had a pedicure and does not need her nails trimmed.  Last A1c January 20, 2022 was 7.5  PCP: Harvie Heck, MD Last seen: 01/20/2022   Objective: AAO x3, NAD DP/PT pulses palpable bilaterally, CRT less than 3 seconds Sensation intact with Semmes Weinstein monofilament Mild hyperkeratotic tissue submetatarsal 2 on the right foot without any underlying ulceration drainage or signs of infection.  There is no open lesions bilaterally. No pain with calf compression, swelling, warmth, erythema  Assessment: 72 year old female with hyperkeratotic lesions, history of neuropathy   Plan: -All treatment options discussed with the patient including all alternatives, risks, complications.  -Abdomen hyperkeratotic lesion as well with any complications or bleeding. -Daily foot inspection.  Vivi Barrack DPM

## 2023-02-27 DIAGNOSIS — G4700 Insomnia, unspecified: Secondary | ICD-10-CM | POA: Insufficient documentation

## 2023-04-23 ENCOUNTER — Ambulatory Visit (INDEPENDENT_AMBULATORY_CARE_PROVIDER_SITE_OTHER): Payer: Medicare HMO | Admitting: Podiatry

## 2023-04-23 DIAGNOSIS — T50901A Poisoning by unspecified drugs, medicaments and biological substances, accidental (unintentional), initial encounter: Secondary | ICD-10-CM | POA: Insufficient documentation

## 2023-04-23 DIAGNOSIS — R0989 Other specified symptoms and signs involving the circulatory and respiratory systems: Secondary | ICD-10-CM

## 2023-04-23 DIAGNOSIS — M2021 Hallux rigidus, right foot: Secondary | ICD-10-CM | POA: Diagnosis not present

## 2023-04-23 DIAGNOSIS — E1149 Type 2 diabetes mellitus with other diabetic neurological complication: Secondary | ICD-10-CM

## 2023-04-23 DIAGNOSIS — M2042 Other hammer toe(s) (acquired), left foot: Secondary | ICD-10-CM

## 2023-04-23 DIAGNOSIS — M2041 Other hammer toe(s) (acquired), right foot: Secondary | ICD-10-CM

## 2023-04-23 DIAGNOSIS — M792 Neuralgia and neuritis, unspecified: Secondary | ICD-10-CM

## 2023-04-23 NOTE — Progress Notes (Signed)
Patient presents to the office today for diabetic shoe and insole measuring.  Patient was measured with brannock device to determine size and width for 1 pair of extra depth shoes and foam casted for 3 pair of insoles.   Documentation of medical necessity will be sent to patient's treating diabetic doctor to verify and sign.   Patient's diabetic provider: Benedict Needy NP   Shoes and insoles will be ordered at that time and patient will be notified for an appointment for fitting when they arrive.   Shoe size (per patient): 8.5 Brannock measurement: 8.5 Patient shoe selection- Shoe choice:   [id number] / [id number] Shoe size ordered: 8.5MD  [employer]anc[employer] signed

## 2023-04-23 NOTE — Progress Notes (Signed)
Subjective: No chief complaint on file.    72 year old female presents the office today for concerns of calluses to her feet.  She is also concerned because she has asymmetrical numbness on the right side worse than left.  She is not reporting back issues any radiating pain.  She also has ongoing pain callus of right foot submetatarsal 2 is causing discomfort.  No open lesions.  Last A1c January 20, 2022 was 7.5  PCP: Harvie Heck, MD Last seen: 01/20/2022   Objective: AAO x3, NAD DP/PT pulses palpable bilaterally, CRT less than 3 seconds Sensation intact with Semmes Weinstein monofilament Mild hyperkeratotic tissue submetatarsal 2 on the right foot without any underlying ulceration drainage or signs of infection.  There is no open lesions bilaterally. No pain with calf compression, swelling, warmth, erythema  Assessment: 72 year old female with hyperkeratotic lesions, history of neuropathy   Plan: -All treatment options discussed with the patient including all alternatives, risks, complications.  -Sharply debrided hyperkeratotic lesion x 1 without any complications or bleeding.  I think that offloading this will be beneficial for her.  Discussed orthotics.  However, Bethann Berkshire for inserts. -Given asymmetrical decrease sensation or nerve conduction test.  She wants to proceed with this.  Vivi Barrack DPM

## 2023-04-25 DIAGNOSIS — F909 Attention-deficit hyperactivity disorder, unspecified type: Secondary | ICD-10-CM | POA: Insufficient documentation

## 2023-05-07 NOTE — Progress Notes (Addendum)
[facility] - Clinic Note  05/13/2023     CHIEF COMPLAINT Patient presents for Retina Evaluation    HISTORY OF PRESENT ILLNESS: Crystal [name] is a 72 y.o. female who presents to the clinic today for:   HPI     Retina Evaluation   In right eye.  This started 1 week ago.  Duration of 1 week.  Associated Symptoms Flashes and Floaters.  Context:  distance vision, mid-range vision and near vision.  I, the attending physician,  performed the HPI with the patient and updated documentation appropriately.        Comments   Retina eval per Dr Hanley Ben for fluid behind the right eye pt is reporting pain in her right eye for the past 2 months she denies itching or burning having little watering pt is reporting vision is not as sharp and she has noticed some flashes and floaters pt last reading was 125 last A1C 7.2 according to meter       Last edited by Rennis Chris, MD on 05/13/2023 11:30 PM.    Pt is here on the referral of Dr. Hanley Ben for concern of swelling behind right eye, pt saw her for a routine eye exam and wanted to get new glasses, but Dr. Hanley Ben told her she had to get the swelling down first, pt states she has the first stages of Alzheimer's and is taking medication for it   Referring physician: Russella Dar, OD [address] [place],  [place] [address]  HISTORICAL INFORMATION:   Selected notes from the MEDICAL RECORD NUMBER Referral from Dr. Hazle Quant for retina eval: - last exam by [facility], 03/17/17 - 2 wk history of floaters OU - hx of DM2 since 2003 - pseudophakia OU (complex CEIOL OD, 12.14.16, OS: 11.28.16)   CURRENT MEDICATIONS: Current Outpatient Medications (Ophthalmic Drugs)  Medication Sig   ketorolac (ACULAR) 0.5 % ophthalmic solution Place 1 drop into the right eye 4 (four) times daily.   prednisoLONE acetate (PRED FORTE) 1 % ophthalmic suspension Place 1 drop into the right eye 4 (four) times daily.   No current  facility-administered medications for this visit. (Ophthalmic Drugs)   Current Outpatient Medications (Other)  Medication Sig   albuterol (VENTOLIN HFA) 108 (90 Base) MCG/ACT inhaler Inhale into the lungs.   Alcohol Swabs (B-D SINGLE USE SWABS REGULAR) PADS Use as directed to check blood sugar. DX E11.9   atenolol (TENORMIN) 25 MG tablet Take by mouth.   atorvastatin (LIPITOR) 80 MG tablet Take 1 tablet by mouth once daily   BINAXNOW COVID-19 AG HOME TEST KIT Use as Directed on the Package   Blood Glucose Monitoring Suppl (ONETOUCH VERIO) w/Device KIT by Does not apply route.   budesonide-formoterol (SYMBICORT) 80-4.5 MCG/ACT inhaler Inhale 2 puffs into the lungs 2 (two) times a day.   buPROPion (WELLBUTRIN XL) 150 MG 24 hr tablet Take 1 tablet by mouth daily.   cephALEXin (KEFLEX) 500 MG capsule Take 1 capsule (500 mg total) by mouth 2 (two) times daily.   cetirizine (ZYRTEC) 10 MG tablet Take 1 tablet (10 mg total) by mouth daily as needed for allergies.   Cholecalciferol (VITAMIN D3) 2000 units TABS Take 1 tablet by mouth daily.   clotrimazole (LOTRIMIN) 1 % cream Apply to affected area 2 times daily for up to 4 weeks   dextromethorphan-guaiFENesin (MUCINEX DM) 30-600 MG 12hr tablet Take 1 to 2 tablets every 12 hours for cough and chest congestion.   diclofenac (VOLTAREN)  75 MG EC tablet Take 1 tablet (75 mg total) by mouth 2 (two) times daily.   dicyclomine (BENTYL) 10 MG capsule TAKE 1 CAPSULE BY MOUTH EVERY 4 TO 6 HOURS AS NEEDED FOR ABDOMINAL PAIN AND CRAMPING OR DIARRHEA   diphenoxylate-atropine (LOMOTIL) 2.5-0.025 MG tablet Take 1 tablet by mouth every morning. If continued diarrhea in the next 2-3 days may increase to twice daily.   donepezil (ARICEPT) 10 MG tablet Take 1 tablet (10 mg total) by mouth at bedtime.   escitalopram (LEXAPRO) 20 MG tablet Take 1 tablet (20 mg total) by mouth daily.   fenofibrate 160 MG tablet TAKE 1 TABLET (160 MG TOTAL) DAILY.   fenofibrate 160 MG tablet  Take 1 tablet by mouth daily.   fenofibrate 160 MG tablet Take by mouth.   fluticasone (FLOVENT HFA) 110 MCG/ACT inhaler Inhale 1-2 puffs into the lungs 2 (two) times daily.   gabapentin (NEURONTIN) 100 MG capsule Take 1 capsule (100 mg total) by mouth 3 (three) times daily.   gabapentin (NEURONTIN) 100 MG capsule Take by mouth.   glimepiride (AMARYL) 1 MG tablet Take by mouth.   glucose blood (ONETOUCH VERIO) test strip Use as instructed to check blood sugar 2 times a day   hydrochlorothiazide (HYDRODIURIL) 25 MG tablet Take 1 tablet (25 mg total) by mouth daily.   ipratropium (ATROVENT) 0.06 % nasal spray SMARTSIG:2 Spray(s) Both Nares Morning-Night   Lancets Ultra Fine MISC Use to check blood sugar two times a day   levothyroxine (SYNTHROID) 25 MCG tablet TAKE 1 TABLET BY MOUTH ONCE DAILY BEFORE  BREAKFAST   levothyroxine (SYNTHROID) 25 MCG tablet Take by mouth.   losartan (COZAAR) 50 MG tablet Take 1 tablet by mouth once daily   losartan (COZAAR) 50 MG tablet Take 1 tablet by mouth daily.   losartan (COZAAR) 50 MG tablet Take by mouth.   montelukast (SINGULAIR) 10 MG tablet TAKE 1 TABLET BY MOUTH AT BEDTIME   montelukast (SINGULAIR) 10 MG tablet Take by mouth.   Multiple Vitamins-Minerals (CENTRUM SILVER PO) Take 1 tablet by mouth daily.     nitrofurantoin, macrocrystal-monohydrate, (MACROBID) 100 MG capsule Take 100 mg by mouth 2 (two) times daily.   oxyCODONE (OXY IR/ROXICODONE) 5 MG immediate release tablet Take 5 mg by mouth 4 (four) times daily as needed.   pantoprazole (PROTONIX) 40 MG tablet Take 1 tablet by mouth once daily   pantoprazole (PROTONIX) 40 MG tablet Take 1 tablet by mouth daily.   Probiotic Product (ALIGN) 4 MG CAPS Take 1 capsule by mouth daily. Reported on 07/02/2015   sitaGLIPtin (JANUVIA) 100 MG tablet Take 1 tablet (100 mg total) by mouth daily.   triamcinolone (KENALOG) 0.025 % ointment Apply 1 application topically daily.   valACYclovir (VALTREX) 1000 MG  tablet Take 1 tablet (1,000 mg total) by mouth 3 (three) times daily.   No current facility-administered medications for this visit. (Other)   REVIEW OF SYSTEMS: ROS   Positive for: Gastrointestinal, Musculoskeletal, Endocrine, Eyes, Respiratory, Psychiatric Negative for: Constitutional, Neurological, Skin, Genitourinary, HENT, Cardiovascular, Allergic/Imm, Heme/Lymph Last edited by Etheleen Mayhew, COT on 05/13/2023  9:24 AM.      ALLERGIES Allergies  Allergen Reactions   Compazine [Prochlorperazine Edisylate]     Comma for 3 days   Sulfa Antibiotics Itching    severe itching   Tetanus Toxoid Other (See Comments)    convulsions   Gabapentin (Once-Daily)    Metformin     Other reaction(s): GI Upset (intolerance) Extreme  diarrhea   PAST MEDICAL HISTORY Past Medical History:  Diagnosis Date   Acute peptic ulcer of stomach    As a teenager   Allergy    dust, cock roach,grass,weeds,molds   Anxiety    Arthritis    knees, hands, shoulders   Asthma    02/08/10 FEV1 1.98 (80%), s/p saba 2.22 l/m (90%).nml   Broken ankle    right foot, wearing a boot   Cognitive change 06/10/2016   Dementia (HCC)    Depression    Diabetes mellitus 2003   Type II   Diverticulosis    Dyspepsia    Fatty liver 12/19/2010   GERD (gastroesophageal reflux disease)    Hearing loss    bilateral hearing aids    Herpes simplex virus (HSV) infection    High cholesterol    Hyperlipemia    Hypertension    Hypothyroidism 09/08/2013   IBS (irritable bowel syndrome)    Insomnia    Internal hemorrhoids    Joint pain 08/08/2016   Low back pain 08/10/2016   OSA (obstructive sleep apnea)    Does not use CPAP - old one broken, new sleep study to be scheduled and then given a new CPAP machine   Plantar fasciitis    no longer an issue   Sun-damaged skin 12/09/2016   SVD (spontaneous vaginal delivery)    x 4   Vaginal Pap smear, abnormal    Vitamin D deficiency 03/04/2016   Wears partial dentures     upper dentures and lower partial   Past Surgical History:  Procedure Laterality Date   APPENDECTOMY     age 61   CATARACT EXTRACTION Bilateral 2016   Dr. Hazle Quant   CATARACT EXTRACTION, BILATERAL     12/8 and 12/14   COLONOSCOPY  06/03/2010, 08/25/2011    severeal - polyps and diverticulosis    DILATATION & CURETTAGE/HYSTEROSCOPY WITH MYOSURE N/A 04/01/2018   Procedure: DILATATION & CURETTAGE/HYSTEROSCOPY WITH MYOSURE POLYPECTOMY;  Surgeon: Allie Bossier, MD;  Location: [facility];  Service: Gynecology;  Laterality: N/A;   EYE SURGERY     cateracts removed bilateral   GASTRECTOMY     partial - ulcer as a teen   HAND SURGERY Right    thumb    HEMORRHOID BANDING     KNEE ARTHROSCOPY Left 1998   NASAL SEPTUM SURGERY     RHINOPLASTY     TOE SURGERY Right 1995   FAMILY HISTORY Family History  Problem Relation Age of Onset   Diabetes Mother        type II   Heart attack Mother 58   Stroke Mother    Hyperlipidemia Mother    Colon cancer Sister    Cancer Sister        GI   Heart disease Sister    Asthma Maternal Grandmother    Mental illness Son        PTSD from military   Parkinsonism Father    Cancer Sister        stage 4 bladder cancer   Colon polyps Sister    Ulcers Other        bleeding gastric   Heart defect Other        child   Other Other        perforated bowel, child   Cancer Brother        pancreatic cancer   Amblyopia Neg Hx    Blindness Neg Hx    Cataracts Neg Hx  Glaucoma Neg Hx    Macular degeneration Neg Hx    Retinal detachment Neg Hx    Strabismus Neg Hx    Retinitis pigmentosa Neg Hx    SOCIAL HISTORY Social History   Tobacco Use   Smoking status: Never   Smokeless tobacco: Never  Vaping Use   Vaping status: Never Used  Substance Use Topics   Alcohol use: Yes    Alcohol/week: 0.0 standard drinks of alcohol    Comment: Rare occasions    Drug use: No       OPHTHALMIC EXAM: Base Eye Exam     Visual Acuity (Snellen - Linear)        Right Left   Dist cc 20/40 20/30 +1   Dist ph cc NI 20/25 +1    Correction: Glasses         Tonometry (Tonopen, 9:35 AM)       Right Left   Pressure 13 12         Pupils       Pupils Dark Light Shape React APD   Right PERRL 3 2 Round Brisk None   Left PERRL 3 2 Round Brisk None         Visual Fields       Left Right    Full Full         Extraocular Movement       Right Left    Full, Ortho Full, Ortho         Neuro/Psych     Oriented x3: Yes   Mood/Affect: Normal         Dilation     Both eyes: 2.5% Phenylephrine @ 9:35 AM           Slit Lamp and Fundus Exam     External Exam       Right Left   External Normal Normal         Slit Lamp Exam       Right Left   Lids/Lashes Dermatochalasis - upper lid Dermatochalasis - upper lid temporally   Conjunctiva/Sclera White and quiet White and quiet   Cornea trace Punctate epithelial erosions, well healed temporal cataract wound Well healed cataract wounds   Anterior Chamber deep and clear, no cell or flare deep and clear   Iris Slightly irregular -- mild peaking temporally, TID at temporal pupil margin Round and dilated; PPM at 0600   Lens 3 piece PC IOL in good position with open PC, mild vitreous prolapse around temporal lens edge Posterior chamber intraocular lens - good position   Anterior Vitreous mild syneresis Mild syneresis; Posterior vitreous detachment         Fundus Exam       Right Left   Disc Pink and sharp, temporal PPP, +cupping +cupping, mild tilt, PPP, Pink and Sharp   C/D Ratio 0.7 0.7   Macula Flat, Blunted foveal reflex, central cystic changes, mild Epiretinal membrane, mild RPE mottling, no frank heme Flat, blunted foveal reflex, Retinal pigment epithelial mottling, No heme or edema   Vessels attenuated, mild tortuosity attenuated, mild tortuosity   Periphery flat; attached; trace RPE changes; hair line Retinal tears at 3 and 4 o'clock w/ good laser scars surrounding  -- basically ora; peripheral cystoid degeneration; ST cobblestoning, No new RT/RD, No heme attached; trace RPE changes, peripheral cystoid degneration           Refraction     Wearing Rx       Sphere  Cylinder Axis Add   Right -1.25 +0.75 088 +2.50   Left -1.25 +0.75 110 +2.50    Type: PAL         Manifest Refraction       Sphere Cylinder Axis Dist VA   Right -1.50 +1.00 090 20/40-1   Left -1.00 +0.75 110 20/20-2            IMAGING AND PROCEDURES  Imaging and Procedures for 04/30/17  OCT, Retina - OU - Both Eyes       Right Eye Quality was good. Central Foveal Thickness: 440. Progression has worsened. Findings include normal foveal contour, abnormal foveal contour, epiretinal membrane, intraretinal fluid, subretinal fluid (Central edema with IRF/SRF, +ERM).   Left Eye Quality was good. Central Foveal Thickness: 249. Progression has been stable. Findings include normal foveal contour, no IRF, no SRF.   Notes Images taken, stored on drive   Diagnosis / Impression:  OD - Central edema with IRF/SRF, +ERM OS - NFP; no IRF/SRF  Clinical management:  See below  Abbreviations: NFP - Normal foveal profile. CME - cystoid macular edema. PED - pigment epithelial detachment. IRF - intraretinal fluid. SRF - subretinal fluid. EZ - ellipsoid zone. ERM - epiretinal membrane. ORA - outer retinal atrophy. ORT - outer retinal tubulation. SRHM - subretinal hyper-reflective material       Fluorescein Angiography Optos (Transit OD)       Right Eye Progression has no prior data. Early phase findings include staining. Mid/Late phase findings include leakage, staining (Perifoveal hyperfluorescent leakage and hypefluorescent staining of disc ).   Left Eye Progression has no prior data. Early phase findings include normal observations. Mid/Late phase findings include normal observations.   Notes Images captured and stored on drive;   Impression: OD: Perifoveal petaloid  hyperfluorescent leakage and hyperfluorescent staining of disc -- consistent with CME OS: normal study             ASSESSMENT/PLAN:    ICD-10-CM   1. CME (cystoid macular edema), right  H35.351 OCT, Retina - OU - Both Eyes    Fluorescein Angiography Optos (Transit OD)    2. 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 Posterior vitreous detachment of both eyes  H43.813     4. Type 2 diabetes mellitus without complication, without long-term current use of insulin (HCC)  E11.9 Fluorescein Angiography Optos (Transit OD)    5. Long term (current) use of oral hypoglycemic drugs  Z79.84     6. Pseudophakia of both eyes  Z96.1     7. Epiretinal membrane (ERM) of right eye  H35.371      1. CME OD  - referred back by Dr. Hanley Ben for mild recurrent CME OD  - pt's last visit here was on 10.28.22  - BCVA 20/40 from 20/25  - OCT shows central edema w/ +IRF and SRF  - FA OD today (10.30.24) shows Perifoveal petaloid hyperfluorescent leakage and hyperfluorescent staining of disc -- consistent with CME   - will re-start PF and Prolensa QID OD  - f/u 4 weeks, DFE, OCT  2. Peripheral retinal tears OD  - Small flap tears at ora, 0300 and 0400 oclock  - s/p barrier laser retinopexy OD, 03/18/17 -- good laser in place  - stable; No new RT/RD  - monitor  3. PVD / vitreous syneresis OU  - Discussed findings and prognosis  - No RT or RD on 360 scleral depressed exam OS  - Reviewed  s/s of RT/RD  - Strict return precautions for any such RT/RD signs/symptoms   4,5. DM2 without retinopathy  - last A1C 7.5 (07.10.23)  - On oral meds  - Discussed importance of tight BS and BP control  6. Pseudophakia OU  - IOLs in good position OU -- beautiful surgeries by Dr. Hazle Quant  - OD with history of anterior vitrectomy and 3 piece sulcus IOL  - monitor   7. Epiretinal membrane, right eye  - very mild ERM - asymptomatic, no metamorphopsia - no indication for surgery at  this time - monitor  Ophthalmic Meds Ordered this visit:  Meds ordered this encounter  Medications   prednisoLONE acetate (PRED FORTE) 1 % ophthalmic suspension    Sig: Place 1 drop into the right eye 4 (four) times daily.    Dispense:  15 mL    Refill:  0   ketorolac (ACULAR) 0.5 % ophthalmic solution    Sig: Place 1 drop into the right eye 4 (four) times daily.    Dispense:  10 mL    Refill:  0      Return in about 4 weeks (around 06/10/2023) for f/u CME OD, DFE, OCT.  There are no Patient Instructions on file for this visit.   Explained the diagnoses, plan, and follow up with the patient and they expressed understanding.  Patient expressed understanding of the importance of proper follow up care.   This document serves as a record of services personally performed by Karie Chimera, MD, PhD. It was created on their behalf by De Blanch, an ophthalmic technician. The creation of this record is the provider's dictation and/or activities during the visit.    Electronically signed by: De Blanch, OA, 05/13/23  11:44 PM  This document serves as a record of services personally performed by Karie Chimera, MD, PhD. It was created on their behalf by Glee Arvin. Manson Passey, OA an ophthalmic technician. The creation of this record is the provider's dictation and/or activities during the visit.    Electronically signed by: Glee Arvin. Manson Passey, OA 05/13/23 11:44 PM   Karie Chimera, M.D., Ph.D. Diseases & Surgery of the Retina and Vitreous [facility] [place] 05/13/2023  I have reviewed the above documentation for accuracy and completeness, and I agree with the above. Karie Chimera, M.D., Ph.D. 05/13/23 11:44 PM  Abbreviations: M myopia (nearsighted); A astigmatism; H hyperopia (farsighted); P presbyopia; Mrx spectacle prescription;  CTL contact lenses; OD right eye; OS left eye; OU both eyes  XT exotropia; ET esotropia; PEK punctate epithelial keratitis; PEE  punctate epithelial erosions; DES dry eye syndrome; MGD meibomian gland dysfunction; ATs artificial tears; PFAT's preservative free artificial tears; NSC nuclear sclerotic cataract; PSC posterior subcapsular cataract; ERM epi-retinal membrane; PVD posterior vitreous detachment; RD retinal detachment; DM diabetes mellitus; DR diabetic retinopathy; NPDR non-proliferative diabetic retinopathy; PDR proliferative diabetic retinopathy; CSME clinically significant macular edema; DME diabetic macular edema; dbh dot blot hemorrhages; CWS cotton wool spot; POAG primary open angle glaucoma; C/D cup-to-disc ratio; HVF humphrey visual field; GVF goldmann visual field; OCT optical coherence tomography; IOP intraocular pressure; BRVO Branch retinal vein occlusion; CRVO central retinal vein occlusion; CRAO central retinal artery occlusion; BRAO branch retinal artery occlusion; RT retinal tear; SB scleral buckle; PPV pars plana vitrectomy; VH Vitreous hemorrhage; PRP panretinal laser photocoagulation; IVK intravitreal kenalog; VMT vitreomacular traction; MH Macular hole;  NVD neovascularization of the disc; NVE neovascularization elsewhere; AREDS age related eye disease study; ARMD age related  macular degeneration; POAG primary open angle glaucoma; EBMD epithelial/anterior basement membrane dystrophy; ACIOL anterior chamber intraocular lens; IOL intraocular lens; PCIOL posterior chamber intraocular lens; Phaco/IOL phacoemulsification with intraocular lens placement; PRK photorefractive keratectomy; LASIK laser assisted in situ keratomileusis; HTN hypertension; DM diabetes mellitus; COPD chronic obstructive pulmonary disease

## 2023-05-13 ENCOUNTER — Ambulatory Visit (INDEPENDENT_AMBULATORY_CARE_PROVIDER_SITE_OTHER): Payer: Medicare HMO | Admitting: Ophthalmology

## 2023-05-13 ENCOUNTER — Encounter (INDEPENDENT_AMBULATORY_CARE_PROVIDER_SITE_OTHER): Payer: Self-pay | Admitting: Ophthalmology

## 2023-05-13 DIAGNOSIS — Z7984 Long term (current) use of oral hypoglycemic drugs: Secondary | ICD-10-CM

## 2023-05-13 DIAGNOSIS — H35371 Puckering of macula, right eye: Secondary | ICD-10-CM

## 2023-05-13 DIAGNOSIS — H3581 Retinal edema: Secondary | ICD-10-CM

## 2023-05-13 DIAGNOSIS — H43813 Vitreous degeneration, bilateral: Secondary | ICD-10-CM

## 2023-05-13 DIAGNOSIS — E119 Type 2 diabetes mellitus without complications: Secondary | ICD-10-CM | POA: Diagnosis not present

## 2023-05-13 DIAGNOSIS — H33331 Multiple defects of retina without detachment, right eye: Secondary | ICD-10-CM

## 2023-05-13 DIAGNOSIS — H35351 Cystoid macular degeneration, right eye: Secondary | ICD-10-CM

## 2023-05-13 DIAGNOSIS — H00013 Hordeolum externum right eye, unspecified eyelid: Secondary | ICD-10-CM

## 2023-05-13 DIAGNOSIS — Z961 Presence of intraocular lens: Secondary | ICD-10-CM

## 2023-05-13 MED ORDER — KETOROLAC TROMETHAMINE 0.5 % OP SOLN: 1.0000 [drp] | Freq: Four times a day (QID) | OPHTHALMIC | 0 refills | Status: DC

## 2023-05-13 MED ORDER — PREDNISOLONE ACETATE 1 % OP SUSP: 1.0000 [drp] | Freq: Four times a day (QID) | OPHTHALMIC | 0 refills | Status: DC

## 2023-05-28 ENCOUNTER — Encounter: Payer: Self-pay | Admitting: Podiatry

## 2023-06-03 ENCOUNTER — Ambulatory Visit (INDEPENDENT_AMBULATORY_CARE_PROVIDER_SITE_OTHER): Payer: Medicare HMO

## 2023-06-03 DIAGNOSIS — R0989 Other specified symptoms and signs involving the circulatory and respiratory systems: Secondary | ICD-10-CM

## 2023-06-03 DIAGNOSIS — M2041 Other hammer toe(s) (acquired), right foot: Secondary | ICD-10-CM | POA: Diagnosis not present

## 2023-06-03 DIAGNOSIS — M778 Other enthesopathies, not elsewhere classified: Secondary | ICD-10-CM

## 2023-06-03 DIAGNOSIS — M2042 Other hammer toe(s) (acquired), left foot: Secondary | ICD-10-CM | POA: Diagnosis not present

## 2023-06-03 DIAGNOSIS — E1149 Type 2 diabetes mellitus with other diabetic neurological complication: Secondary | ICD-10-CM | POA: Diagnosis not present

## 2023-06-03 DIAGNOSIS — M2021 Hallux rigidus, right foot: Secondary | ICD-10-CM

## 2023-06-03 NOTE — Progress Notes (Signed)

## 2023-06-04 NOTE — Progress Notes (Signed)
[facility] - Clinic Note  06/10/2023     CHIEF COMPLAINT Patient presents for Retina Follow Up    HISTORY OF PRESENT ILLNESS: Crystal [name] is a 72 y.o. female who presents to the clinic today for:   HPI     Retina Follow Up   Patient presents with  Other.  In right eye.  This started 4 weeks ago.  I, the attending physician,  performed the HPI with the patient and updated documentation appropriately.        Comments   Patient here for 4 weeks retina follow up for CME OD. Patient states vision hope a lot better. Uses readers. Feels like eyes strain. Eyes hurt. Uses PF TID OD and Ketorolac TID OD.      Last edited by Rennis Chris, MD on 06/10/2023 12:11 PM.     Referring physician: Harvie Heck, MD [address] [place] [address] [place],  [place] [address]  HISTORICAL INFORMATION:   Selected notes from the MEDICAL RECORD NUMBER Referral from Dr. Hazle Quant for retina eval: - last exam by [facility], 03/17/17 - 2 wk history of floaters OU - hx of DM2 since 2003 - pseudophakia OU (complex CEIOL OD, 12.14.16, OS: 11.28.16)   CURRENT MEDICATIONS: Current Outpatient Medications (Ophthalmic Drugs)  Medication Sig   ketorolac (ACULAR) 0.5 % ophthalmic solution Place 1 drop into the right eye 3 (three) times daily.   prednisoLONE acetate (PRED FORTE) 1 % ophthalmic suspension Place 1 drop into the right eye 3 (three) times daily.   No current facility-administered medications for this visit. (Ophthalmic Drugs)   Current Outpatient Medications (Other)  Medication Sig   albuterol (VENTOLIN HFA) 108 (90 Base) MCG/ACT inhaler Inhale into the lungs.   Alcohol Swabs (B-D SINGLE USE SWABS REGULAR) PADS Use as directed to check blood sugar. DX E11.9   atenolol (TENORMIN) 25 MG tablet Take by mouth.   BINAXNOW COVID-19 AG HOME TEST KIT Use as Directed on the Package   Blood Glucose Monitoring Suppl (ONETOUCH VERIO) w/Device KIT by Does not apply  route.   budesonide-formoterol (SYMBICORT) 80-4.5 MCG/ACT inhaler Inhale 2 puffs into the lungs 2 (two) times a day.   buPROPion (WELLBUTRIN XL) 150 MG 24 hr tablet Take 1 tablet by mouth daily.   cephALEXin (KEFLEX) 500 MG capsule Take 1 capsule (500 mg total) by mouth 2 (two) times daily.   cetirizine (ZYRTEC) 10 MG tablet Take 1 tablet (10 mg total) by mouth daily as needed for allergies.   Cholecalciferol (VITAMIN D3) 2000 units TABS Take 1 tablet by mouth daily.   clotrimazole (LOTRIMIN) 1 % cream Apply to affected area 2 times daily for up to 4 weeks   dextromethorphan-guaiFENesin (MUCINEX DM) 30-600 MG 12hr tablet Take 1 to 2 tablets every 12 hours for cough and chest congestion.   diclofenac (VOLTAREN) 75 MG EC tablet Take 1 tablet (75 mg total) by mouth 2 (two) times daily.   dicyclomine (BENTYL) 10 MG capsule TAKE 1 CAPSULE BY MOUTH EVERY 4 TO 6 HOURS AS NEEDED FOR ABDOMINAL PAIN AND CRAMPING OR DIARRHEA   diphenoxylate-atropine (LOMOTIL) 2.5-0.025 MG tablet Take 1 tablet by mouth every morning. If continued diarrhea in the next 2-3 days may increase to twice daily.   escitalopram (LEXAPRO) 20 MG tablet Take 1 tablet (20 mg total) by mouth daily.   fluticasone (FLOVENT HFA) 110 MCG/ACT inhaler Inhale 1-2 puffs into the lungs 2 (two) times daily.   glimepiride (AMARYL) 1 MG tablet  Take by mouth.   glucose blood (ONETOUCH VERIO) test strip Use as instructed to check blood sugar 2 times a day   hydrochlorothiazide (HYDRODIURIL) 25 MG tablet Take 1 tablet (25 mg total) by mouth daily.   ipratropium (ATROVENT) 0.06 % nasal spray SMARTSIG:2 Spray(s) Both Nares Morning-Night   Lancets Ultra Fine MISC Use to check blood sugar two times a day   Multiple Vitamins-Minerals (CENTRUM SILVER PO) Take 1 tablet by mouth daily.     nitrofurantoin, macrocrystal-monohydrate, (MACROBID) 100 MG capsule Take 100 mg by mouth 2 (two) times daily.   oxyCODONE (OXY IR/ROXICODONE) 5 MG immediate release tablet  Take 5 mg by mouth 4 (four) times daily as needed.   Probiotic Product (ALIGN) 4 MG CAPS Take 1 capsule by mouth daily. Reported on 07/02/2015   sitaGLIPtin (JANUVIA) 100 MG tablet Take 1 tablet (100 mg total) by mouth daily.   triamcinolone (KENALOG) 0.025 % ointment Apply 1 application topically daily.   valACYclovir (VALTREX) 1000 MG tablet Take 1 tablet (1,000 mg total) by mouth 3 (three) times daily.   atorvastatin (LIPITOR) 80 MG tablet Take 1 tablet by mouth once daily   donepezil (ARICEPT) 10 MG tablet Take 1 tablet (10 mg total) by mouth at bedtime.   fenofibrate 160 MG tablet TAKE 1 TABLET (160 MG TOTAL) DAILY.   fenofibrate 160 MG tablet Take 1 tablet by mouth daily.   fenofibrate 160 MG tablet Take by mouth.   gabapentin (NEURONTIN) 100 MG capsule Take 1 capsule (100 mg total) by mouth 3 (three) times daily.   gabapentin (NEURONTIN) 100 MG capsule Take by mouth.   levothyroxine (SYNTHROID) 25 MCG tablet TAKE 1 TABLET BY MOUTH ONCE DAILY BEFORE  BREAKFAST   levothyroxine (SYNTHROID) 25 MCG tablet Take by mouth.   losartan (COZAAR) 50 MG tablet Take 1 tablet by mouth once daily   losartan (COZAAR) 50 MG tablet Take 1 tablet by mouth daily.   losartan (COZAAR) 50 MG tablet Take by mouth.   montelukast (SINGULAIR) 10 MG tablet TAKE 1 TABLET BY MOUTH AT BEDTIME   montelukast (SINGULAIR) 10 MG tablet Take by mouth.   pantoprazole (PROTONIX) 40 MG tablet Take 1 tablet by mouth once daily   pantoprazole (PROTONIX) 40 MG tablet Take 1 tablet by mouth daily.   No current facility-administered medications for this visit. (Other)   REVIEW OF SYSTEMS: ROS   Positive for: Gastrointestinal, Musculoskeletal, Endocrine, Eyes, Respiratory, Psychiatric Negative for: Constitutional, Neurological, Skin, Genitourinary, HENT, Cardiovascular, Allergic/Imm, Heme/Lymph Last edited by Laddie Aquas, COA on 06/10/2023  9:09 AM.     ALLERGIES Allergies  Allergen Reactions   Compazine  [Prochlorperazine Edisylate]     Comma for 3 days   Sulfa Antibiotics Itching    severe itching   Tetanus Toxoid Other (See Comments)    convulsions   Gabapentin (Once-Daily)    Metformin     Other reaction(s): GI Upset (intolerance) Extreme diarrhea   PAST MEDICAL HISTORY Past Medical History:  Diagnosis Date   Acute peptic ulcer of stomach    As a teenager   Allergy    dust, cock roach,grass,weeds,molds   Anxiety    Arthritis    knees, hands, shoulders   Asthma    02/08/10 FEV1 1.98 (80%), s/p saba 2.22 l/m (90%).nml   Broken ankle    right foot, wearing a boot   Cognitive change 06/10/2016   Dementia (HCC)    Depression    Diabetes mellitus 2003   Type II  Diverticulosis    Dyspepsia    Fatty liver 12/19/2010   GERD (gastroesophageal reflux disease)    Hearing loss    bilateral hearing aids    Herpes simplex virus (HSV) infection    High cholesterol    Hyperlipemia    Hypertension    Hypothyroidism 09/08/2013   IBS (irritable bowel syndrome)    Insomnia    Internal hemorrhoids    Joint pain 08/08/2016   Low back pain 08/10/2016   OSA (obstructive sleep apnea)    Does not use CPAP - old one broken, new sleep study to be scheduled and then given a new CPAP machine   Plantar fasciitis    no longer an issue   Sun-damaged skin 12/09/2016   SVD (spontaneous vaginal delivery)    x 4   Vaginal Pap smear, abnormal    Vitamin D deficiency 03/04/2016   Wears partial dentures    upper dentures and lower partial   Past Surgical History:  Procedure Laterality Date   APPENDECTOMY     age 7   CATARACT EXTRACTION Bilateral 2016   Dr. Hazle Quant   CATARACT EXTRACTION, BILATERAL     12/8 and 12/14   COLONOSCOPY  06/03/2010, 08/25/2011    severeal - polyps and diverticulosis    DILATATION & CURETTAGE/HYSTEROSCOPY WITH MYOSURE N/A 04/01/2018   Procedure: DILATATION & CURETTAGE/HYSTEROSCOPY WITH MYOSURE POLYPECTOMY;  Surgeon: Allie Bossier, MD;  Location: [facility];  Service:  Gynecology;  Laterality: N/A;   EYE SURGERY     cateracts removed bilateral   GASTRECTOMY     partial - ulcer as a teen   HAND SURGERY Right    thumb    HEMORRHOID BANDING     KNEE ARTHROSCOPY Left 1998   NASAL SEPTUM SURGERY     RHINOPLASTY     TOE SURGERY Right 1995   FAMILY HISTORY Family History  Problem Relation Age of Onset   Diabetes Mother        type II   Heart attack Mother 55   Stroke Mother    Hyperlipidemia Mother    Colon cancer Sister    Cancer Sister        GI   Heart disease Sister    Asthma Maternal Grandmother    Mental illness Son        PTSD from military   Parkinsonism Father    Cancer Sister        stage 4 bladder cancer   Colon polyps Sister    Ulcers Other        bleeding gastric   Heart defect Other        child   Other Other        perforated bowel, child   Cancer Brother        pancreatic cancer   Amblyopia Neg Hx    Blindness Neg Hx    Cataracts Neg Hx    Glaucoma Neg Hx    Macular degeneration Neg Hx    Retinal detachment Neg Hx    Strabismus Neg Hx    Retinitis pigmentosa Neg Hx    SOCIAL HISTORY Social History   Tobacco Use   Smoking status: Never   Smokeless tobacco: Never  Vaping Use   Vaping status: Never Used  Substance Use Topics   Alcohol use: Yes    Alcohol/week: 0.0 standard drinks of alcohol    Comment: Rare occasions    Drug use: No       OPHTHALMIC  EXAM: Base Eye Exam     Visual Acuity (Snellen - Linear)       Right Left   Dist [place] 20/50 20/20   Dist ph [place] 20/40   Lost glasses        Tonometry (Tonopen, 9:06 AM)       Right Left   Pressure 15 13         Pupils       Dark Light Shape React APD   Right 3 2 Round Brisk None   Left              Visual Fields (Counting fingers)       Left Right    Full Full         Extraocular Movement       Right Left    Full, Ortho Full, Ortho         Neuro/Psych     Oriented x3: Yes   Mood/Affect: Normal         Dilation      Both eyes: 1.0% Mydriacyl, 2.5% Phenylephrine @ 9:06 AM           Slit Lamp and Fundus Exam     External Exam       Right Left   External Normal Normal         Slit Lamp Exam       Right Left   Lids/Lashes Dermatochalasis - upper lid Dermatochalasis - upper lid temporally   Conjunctiva/Sclera White and quiet White and quiet   Cornea trace Punctate epithelial erosions, well healed temporal cataract wound Well healed cataract wounds   Anterior Chamber Vit strands to temporal cataract wound, deep and clear, no cell or flare deep and clear   Iris Slightly irregular -- mild peaking temporally, TID at temporal pupil margin Round and dilated; PPM at 0600   Lens 3 piece PC IOL in good position with open PC, mild vitreous prolapse around temporal lens edge PC IOL in good postition   Anterior Vitreous mild syneresis Mild syneresis; Posterior vitreous detachment         Fundus Exam       Right Left   Disc Pink and sharp, temporal PPP, +cupping +cupping, mild tilt, PPP, Pink and Sharp   C/D Ratio 0.7 0.7   Macula Flat, Blunted foveal reflex, central cystic changes- improved, mild Epiretinal membrane, mild RPE mottling, no frank heme Flat, blunted foveal reflex, Retinal pigment epithelial mottling, No heme or edema   Vessels attenuated, mild tortuosity attenuated, mild tortuosity   Periphery flat; attached; trace RPE changes; hair line Retinal tears at 3 and 4 o'clock w/ good laser scars surrounding -- basically ora; peripheral cystoid degeneration; ST cobblestoning, No new RT/RD, No heme attached; trace RPE changes, peripheral cystoid degneration, rare MA           Refraction     Wearing Rx       Sphere Cylinder Axis Add   Right -1.25 +0.75 088 +2.50   Left -1.25 +0.75 110 +2.50    Type: PAL            IMAGING AND PROCEDURES  Imaging and Procedures for 04/30/17  OCT, Retina - OU - Both Eyes       Right Eye Quality was good. Central Foveal Thickness: 330.  Progression has improved. Findings include normal foveal contour, no IRF, no SRF, epiretinal membrane, intraretinal fluid, subretinal fluid (Interval resolution of Central edema with IRF/SRF, +ERM).   Left  Eye Quality was good. Central Foveal Thickness: 281. Progression has been stable. Findings include normal foveal contour, no IRF, no SRF.   Notes Images taken, stored on drive   Diagnosis / Impression:  OD - Interval resolution of Central edema with IRF/SRF, +ERM OS - NFP; no IRF/SRF  Clinical management:  See below  Abbreviations: NFP - Normal foveal profile. CME - cystoid macular edema. PED - pigment epithelial detachment. IRF - intraretinal fluid. SRF - subretinal fluid. EZ - ellipsoid zone. ERM - epiretinal membrane. ORA - outer retinal atrophy. ORT - outer retinal tubulation. SRHM - subretinal hyper-reflective material             ASSESSMENT/PLAN:    ICD-10-CM   1. CME (cystoid macular edema), right  H35.351 OCT, Retina - OU - Both Eyes    2. 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 Posterior vitreous detachment of both eyes  H43.813     4. Type 2 diabetes mellitus without complication, without long-term current use of insulin (HCC)  E11.9     5. Long term (current) use of oral hypoglycemic drugs  Z79.84     6. Pseudophakia of both eyes  Z96.1     7. Epiretinal membrane (ERM) of right eye  H35.371      1. CME OD  - referred back by Dr. Hanley Ben for mild recurrent CME OD -- October 2024  - pt's last visit here prior to that was on 10.28.22  - FA OD (10.30.24) shows Perifoveal petaloid hyperfluorescent leakage and hyperfluorescent staining of disc -- consistent with CME - restarted on PF and Prolensa on 10.30.24 - today, BCVA 20/40 -- stable  - OCT shows interval resolution of central edema w/ +IRF and SRF  - begin slow taper of PF and Prolensa OD -- TID for 2 wks, BID for 2 wks, and every day for 2 wks, then stop  - f/u 6 weeks, DFE,  OCT  2. Peripheral retinal tears OD  - Small flap tears at ora, 0300 and 0400 oclock  - s/p barrier laser retinopexy OD, 03/18/17 -- good laser in place  - stable; No new RT/RD  - monitor  3. PVD / vitreous syneresis OU  - Discussed findings and prognosis  - No RT or RD on 360 scleral depressed exam OS  - Reviewed s/s of RT/RD  - Strict return precautions for any such RT/RD signs/symptoms   4,5. DM2 without retinopathy  - last A1C 7.5 (07.10.23)  - On oral meds  - Discussed importance of tight BS and BP control  6. Pseudophakia OU  - IOLs in good position OU -- beautiful surgeries by Dr. Hazle Quant  - OD with history of anterior vitrectomy and 3 piece sulcus IOL  - monitor   7. Epiretinal membrane, right eye  - very mild ERM - asymptomatic, no metamorphopsia - no indication for surgery at this time - monitor  Ophthalmic Meds Ordered this visit:  Meds ordered this encounter  Medications   ketorolac (ACULAR) 0.5 % ophthalmic solution    Sig: Place 1 drop into the right eye 3 (three) times daily.    Dispense:  10 mL    Refill:  2   prednisoLONE acetate (PRED FORTE) 1 % ophthalmic suspension    Sig: Place 1 drop into the right eye 3 (three) times daily.    Dispense:  15 mL    Refill:  2      Return in about 6  weeks (around 07/22/2023) for f/u CME OD, DFE, OCT.  There are no Patient Instructions on file for this visit.   Explained the diagnoses, plan, and follow up with the patient and they expressed understanding.  Patient expressed understanding of the importance of proper follow up care.   This document serves as a record of services personally performed by Karie Chimera, MD, PhD. It was created on their behalf by De Blanch, an ophthalmic technician. The creation of this record is the provider's dictation and/or activities during the visit.    Electronically signed by: De Blanch, OA, 06/10/23  12:15 PM  This document serves as a record of services  personally performed by Karie Chimera, MD, PhD. It was created on their behalf by Charlette Caffey, COT an ophthalmic technician. The creation of this record is the provider's dictation and/or activities during the visit.    Electronically signed by:  Charlette Caffey, COT  06/10/23 12:15 PM  Karie Chimera, M.D., Ph.D. Diseases & Surgery of the Retina and Vitreous [facility]  I have reviewed the above documentation for accuracy and completeness, and I agree with the above. Karie Chimera, M.D., Ph.D. 06/10/23 12:15 PM  Abbreviations: M myopia (nearsighted); A astigmatism; H hyperopia (farsighted); P presbyopia; Mrx spectacle prescription;  CTL contact lenses; OD right eye; OS left eye; OU both eyes  XT exotropia; ET esotropia; PEK punctate epithelial keratitis; PEE punctate epithelial erosions; DES dry eye syndrome; MGD meibomian gland dysfunction; ATs artificial tears; PFAT's preservative free artificial tears; NSC nuclear sclerotic cataract; PSC posterior subcapsular cataract; ERM epi-retinal membrane; PVD posterior vitreous detachment; RD retinal detachment; DM diabetes mellitus; DR diabetic retinopathy; NPDR non-proliferative diabetic retinopathy; PDR proliferative diabetic retinopathy; CSME clinically significant macular edema; DME diabetic macular edema; dbh dot blot hemorrhages; CWS cotton wool spot; POAG primary open angle glaucoma; C/D cup-to-disc ratio; HVF humphrey visual field; GVF goldmann visual field; OCT optical coherence tomography; IOP intraocular pressure; BRVO Branch retinal vein occlusion; CRVO central retinal vein occlusion; CRAO central retinal artery occlusion; BRAO branch retinal artery occlusion; RT retinal tear; SB scleral buckle; PPV pars plana vitrectomy; VH Vitreous hemorrhage; PRP panretinal laser photocoagulation; IVK intravitreal kenalog; VMT vitreomacular traction; MH Macular hole;  NVD neovascularization of the disc; NVE  neovascularization elsewhere; AREDS age related eye disease study; ARMD age related macular degeneration; POAG primary open angle glaucoma; EBMD epithelial/anterior basement membrane dystrophy; ACIOL anterior chamber intraocular lens; IOL intraocular lens; PCIOL posterior chamber intraocular lens; Phaco/IOL phacoemulsification with intraocular lens placement; PRK photorefractive keratectomy; LASIK laser assisted in situ keratomileusis; HTN hypertension; DM diabetes mellitus; COPD chronic obstructive pulmonary disease

## 2023-06-10 ENCOUNTER — Encounter (INDEPENDENT_AMBULATORY_CARE_PROVIDER_SITE_OTHER): Payer: Self-pay | Admitting: Ophthalmology

## 2023-06-10 ENCOUNTER — Ambulatory Visit (INDEPENDENT_AMBULATORY_CARE_PROVIDER_SITE_OTHER): Payer: Medicare HMO | Admitting: Ophthalmology

## 2023-06-10 DIAGNOSIS — H35351 Cystoid macular degeneration, right eye: Secondary | ICD-10-CM | POA: Diagnosis not present

## 2023-06-10 DIAGNOSIS — H43813 Vitreous degeneration, bilateral: Secondary | ICD-10-CM | POA: Diagnosis not present

## 2023-06-10 DIAGNOSIS — H33331 Multiple defects of retina without detachment, right eye: Secondary | ICD-10-CM | POA: Diagnosis not present

## 2023-06-10 DIAGNOSIS — Z7984 Long term (current) use of oral hypoglycemic drugs: Secondary | ICD-10-CM | POA: Diagnosis not present

## 2023-06-10 DIAGNOSIS — E119 Type 2 diabetes mellitus without complications: Secondary | ICD-10-CM | POA: Diagnosis not present

## 2023-06-10 DIAGNOSIS — H35371 Puckering of macula, right eye: Secondary | ICD-10-CM

## 2023-06-10 DIAGNOSIS — Z961 Presence of intraocular lens: Secondary | ICD-10-CM

## 2023-06-10 MED ORDER — KETOROLAC TROMETHAMINE 0.5 % OP SOLN: 1.0000 [drp] | Freq: Three times a day (TID) | OPHTHALMIC | 2 refills | Status: AC

## 2023-06-10 MED ORDER — PREDNISOLONE ACETATE 1 % OP SUSP
1.0000 [drp] | Freq: Three times a day (TID) | OPHTHALMIC | 2 refills | Status: DC
Start: 2023-06-10 — End: 2023-07-22

## 2023-06-26 ENCOUNTER — Other Ambulatory Visit: Payer: Medicare HMO

## 2023-07-14 ENCOUNTER — Other Ambulatory Visit (INDEPENDENT_AMBULATORY_CARE_PROVIDER_SITE_OTHER): Payer: Self-pay | Admitting: Ophthalmology

## 2023-07-20 NOTE — Progress Notes (Signed)
 [facility] - Clinic Note  07/22/2023     CHIEF COMPLAINT Patient presents for Retina Follow Up    HISTORY OF PRESENT ILLNESS: Crystal [name] is a 73 y.o. female who presents to the clinic today for:   HPI     Retina Follow Up   In right eye.  This started 6 weeks ago.  Duration of 6 weeks.  Since onset it is stable.  I, the attending physician,  performed the HPI with the patient and updated documentation appropriately.        Comments   6 week retina follow up CME OD pt is reporting no vision changes noticed she denies any flashes or floaters last reading 162 pt has not been using PF or Acular  for the last 2 weeks       Last edited by Valdemar Rogue, MD on 07/22/2023 12:47 PM.    Pt states her she got new glasses, she states she stopped using PF and Prolensa  after the last time she was here instead of tapering off like she was supposed to  Referring physician: Leonce Sink, MD [address] [place],  [place] [address]  HISTORICAL INFORMATION:   Selected notes from the MEDICAL RECORD NUMBER Referral from Dr. Camillo for retina eval: - last exam by [facility], 03/17/17 - 2 wk history of floaters OU - hx of DM2 since 2003 - pseudophakia OU (complex CEIOL OD, 12.14.16, OS: 11.28.16)   CURRENT MEDICATIONS: Current Outpatient Medications (Ophthalmic Drugs)  Medication Sig   Bromfenac  Sodium 0.07 % SOLN Place 1 drop into the right eye 4 (four) times daily.   ketorolac  (ACULAR ) 0.5 % ophthalmic solution Place 1 drop into the right eye 3 (three) times daily.   prednisoLONE  acetate (PRED FORTE ) 1 % ophthalmic suspension Place 1 drop into the right eye 4 (four) times daily.   No current facility-administered medications for this visit. (Ophthalmic Drugs)   Current Outpatient Medications (Other)  Medication Sig   albuterol  (VENTOLIN  HFA) 108 (90 Base) MCG/ACT inhaler Inhale into the lungs.   Alcohol Swabs (B-D SINGLE USE SWABS  REGULAR) PADS Use as directed to check blood sugar. DX E11.9   atenolol  (TENORMIN ) 25 MG tablet Take by mouth.   atorvastatin  (LIPITOR) 80 MG tablet Take 1 tablet by mouth once daily   BINAXNOW COVID-19 AG HOME TEST KIT Use as Directed on the Package   Blood Glucose Monitoring Suppl (ONETOUCH VERIO) w/Device KIT by Does not apply route.   budesonide -formoterol  (SYMBICORT ) 80-4.5 MCG/ACT inhaler Inhale 2 puffs into the lungs 2 (two) times a day.   buPROPion (WELLBUTRIN XL) 150 MG 24 hr tablet Take 1 tablet by mouth daily.   cephALEXin  (KEFLEX ) 500 MG capsule Take 1 capsule (500 mg total) by mouth 2 (two) times daily.   cetirizine  (ZYRTEC ) 10 MG tablet Take 1 tablet (10 mg total) by mouth daily as needed for allergies.   Cholecalciferol (VITAMIN D3) 2000 units TABS Take 1 tablet by mouth daily.   clotrimazole  (LOTRIMIN ) 1 % cream Apply to affected area 2 times daily for up to 4 weeks   dextromethorphan-guaiFENesin (MUCINEX DM) 30-600 MG 12hr tablet Take 1 to 2 tablets every 12 hours for cough and chest congestion.   diclofenac  (VOLTAREN ) 75 MG EC tablet Take 1 tablet (75 mg total) by mouth 2 (two) times daily.   dicyclomine  (BENTYL ) 10 MG capsule TAKE 1 CAPSULE BY MOUTH EVERY 4 TO 6 HOURS AS NEEDED FOR ABDOMINAL PAIN AND  CRAMPING OR DIARRHEA   diphenoxylate -atropine  (LOMOTIL ) 2.5-0.025 MG tablet Take 1 tablet by mouth every morning. If continued diarrhea in the next 2-3 days may increase to twice daily.   donepezil  (ARICEPT ) 10 MG tablet Take 1 tablet (10 mg total) by mouth at bedtime.   escitalopram  (LEXAPRO ) 20 MG tablet Take 1 tablet (20 mg total) by mouth daily.   fenofibrate  160 MG tablet TAKE 1 TABLET (160 MG TOTAL) DAILY.   fenofibrate  160 MG tablet Take 1 tablet by mouth daily.   fenofibrate  160 MG tablet Take by mouth.   fluticasone  (FLOVENT  HFA) 110 MCG/ACT inhaler Inhale 1-2 puffs into the lungs 2 (two) times daily.   gabapentin  (NEURONTIN ) 100 MG capsule Take 1 capsule (100 mg total)  by mouth 3 (three) times daily.   gabapentin  (NEURONTIN ) 100 MG capsule Take by mouth.   glimepiride  (AMARYL ) 1 MG tablet Take by mouth.   glucose blood (ONETOUCH VERIO) test strip Use as instructed to check blood sugar 2 times a day   hydrochlorothiazide  (HYDRODIURIL ) 25 MG tablet Take 1 tablet (25 mg total) by mouth daily.   ipratropium (ATROVENT) 0.06 % nasal spray SMARTSIG:2 Spray(s) Both Nares Morning-Night   Lancets Ultra Fine MISC Use to check blood sugar two times a day   levothyroxine  (SYNTHROID ) 25 MCG tablet TAKE 1 TABLET BY MOUTH ONCE DAILY BEFORE  BREAKFAST   levothyroxine  (SYNTHROID ) 25 MCG tablet Take by mouth.   losartan  (COZAAR ) 50 MG tablet Take 1 tablet by mouth once daily   losartan  (COZAAR ) 50 MG tablet Take 1 tablet by mouth daily.   losartan  (COZAAR ) 50 MG tablet Take by mouth.   montelukast  (SINGULAIR ) 10 MG tablet TAKE 1 TABLET BY MOUTH AT BEDTIME   montelukast  (SINGULAIR ) 10 MG tablet Take by mouth.   Multiple Vitamins-Minerals (CENTRUM SILVER PO) Take 1 tablet by mouth daily.     nitrofurantoin, macrocrystal-monohydrate, (MACROBID) 100 MG capsule Take 100 mg by mouth 2 (two) times daily.   oxyCODONE  (OXY IR/ROXICODONE ) 5 MG immediate release tablet Take 5 mg by mouth 4 (four) times daily as needed.   pantoprazole  (PROTONIX ) 40 MG tablet Take 1 tablet by mouth once daily   pantoprazole  (PROTONIX ) 40 MG tablet Take 1 tablet by mouth daily.   Probiotic Product (ALIGN) 4 MG CAPS Take 1 capsule by mouth daily. Reported on 07/02/2015   sitaGLIPtin  (JANUVIA ) 100 MG tablet Take 1 tablet (100 mg total) by mouth daily.   triamcinolone  (KENALOG ) 0.025 % ointment Apply 1 application topically daily.   valACYclovir  (VALTREX ) 1000 MG tablet Take 1 tablet (1,000 mg total) by mouth 3 (three) times daily.   No current facility-administered medications for this visit. (Other)   REVIEW OF SYSTEMS: ROS   Positive for: Gastrointestinal, Musculoskeletal, Endocrine, Eyes, Respiratory,  Psychiatric Negative for: Constitutional, Neurological, Skin, Genitourinary, HENT, Cardiovascular, Allergic/Imm, Heme/Lymph Last edited by Resa Delon ORN, COT on 07/22/2023 10:09 AM.      ALLERGIES Allergies  Allergen Reactions   Compazine [Prochlorperazine Edisylate]     Comma for 3 days   Sulfa  Antibiotics Itching    severe itching   Tetanus Toxoid Other (See Comments)    convulsions   Gabapentin  (Once-Daily)    Metformin      Other reaction(s): GI Upset (intolerance) Extreme diarrhea   PAST MEDICAL HISTORY Past Medical History:  Diagnosis Date   Acute peptic ulcer of stomach    As a teenager   Allergy    dust, cock roach,grass,weeds,molds   Anxiety  Arthritis    knees, hands, shoulders   Asthma    02/08/10 FEV1 1.98 (80%), s/p saba 2.22 l/m (90%).nml   Broken ankle    right foot, wearing a boot   Cognitive change 06/10/2016   Dementia (HCC)    Depression    Diabetes mellitus 2003   Type II   Diverticulosis    Dyspepsia    Fatty liver 12/19/2010   GERD (gastroesophageal reflux disease)    Hearing loss    bilateral hearing aids    Herpes simplex virus (HSV) infection    High cholesterol    Hyperlipemia    Hypertension    Hypothyroidism 09/08/2013   IBS (irritable bowel syndrome)    Insomnia    Internal hemorrhoids    Joint pain 08/08/2016   Low back pain 08/10/2016   OSA (obstructive sleep apnea)    Does not use CPAP - old one broken, new sleep study to be scheduled and then given a new CPAP machine   Plantar fasciitis    no longer an issue   Sun-damaged skin 12/09/2016   SVD (spontaneous vaginal delivery)    x 4   Vaginal Pap smear, abnormal    Vitamin D  deficiency 03/04/2016   Wears partial dentures    upper dentures and lower partial   Past Surgical History:  Procedure Laterality Date   APPENDECTOMY     age 37   CATARACT EXTRACTION Bilateral 2016   Dr. Camillo   CATARACT EXTRACTION, BILATERAL     12/8 and 12/14   COLONOSCOPY  06/03/2010,  08/25/2011    severeal - polyps and diverticulosis    DILATATION & CURETTAGE/HYSTEROSCOPY WITH MYOSURE N/A 04/01/2018   Procedure: DILATATION & CURETTAGE/HYSTEROSCOPY WITH MYOSURE POLYPECTOMY;  Surgeon: Starla Harland BROCKS, MD;  Location: [facility];  Service: Gynecology;  Laterality: N/A;   EYE SURGERY     cateracts removed bilateral   GASTRECTOMY     partial - ulcer as a teen   HAND SURGERY Right    thumb    HEMORRHOID BANDING     KNEE ARTHROSCOPY Left 1998   NASAL SEPTUM SURGERY     RHINOPLASTY     TOE SURGERY Right 1995   FAMILY HISTORY Family History  Problem Relation Age of Onset   Diabetes Mother        type II   Heart attack Mother 68   Stroke Mother    Hyperlipidemia Mother    Colon cancer Sister    Cancer Sister        GI   Heart disease Sister    Asthma Maternal Grandmother    Mental illness Son        PTSD from military   Parkinsonism Father    Cancer Sister        stage 4 bladder cancer   Colon polyps Sister    Ulcers Other        bleeding gastric   Heart defect Other        child   Other Other        perforated bowel, child   Cancer Brother        pancreatic cancer   Amblyopia Neg Hx    Blindness Neg Hx    Cataracts Neg Hx    Glaucoma Neg Hx    Macular degeneration Neg Hx    Retinal detachment Neg Hx    Strabismus Neg Hx    Retinitis pigmentosa Neg Hx    SOCIAL HISTORY Social History  Tobacco Use   Smoking status: Never   Smokeless tobacco: Never  Vaping Use   Vaping status: Never Used  Substance Use Topics   Alcohol use: Yes    Alcohol/week: 0.0 standard drinks of alcohol    Comment: Rare occasions    Drug use: No       OPHTHALMIC EXAM: Base Eye Exam     Visual Acuity (Snellen - Linear)       Right Left   Dist cc 20/60 20/25 -2   Dist ph cc 20/50 -1 NI         Tonometry (Tonopen, 10:15 AM)       Right Left   Pressure 16 16         Pupils       Pupils Dark Light Shape React APD   Right PERRL 3 2 Round Brisk None   Left  PERRL 3 2 Round Brisk None         Visual Fields       Left Right    Full Full         Extraocular Movement       Right Left    Full, Ortho Full, Ortho         Neuro/Psych     Oriented x3: Yes   Mood/Affect: Normal         Dilation     Both eyes: 2.5% Phenylephrine @ 10:12 AM           Slit Lamp and Fundus Exam     External Exam       Right Left   External Normal Normal         Slit Lamp Exam       Right Left   Lids/Lashes Dermatochalasis - upper lid Dermatochalasis - upper lid temporally   Conjunctiva/Sclera White and quiet White and quiet   Cornea trace Punctate epithelial erosions, well healed temporal cataract wound Well healed cataract wounds   Anterior Chamber Vit strands to temporal cataract wound, deep and clear, no cell or flare deep and clear   Iris Slightly irregular -- mild peaking temporally, TID at temporal pupil margin Round and dilated; PPM at 0600   Lens 3 piece PC IOL in good position with open PC, mild vitreous prolapse around temporal lens edge PC IOL in good postition   Anterior Vitreous mild syneresis Mild syneresis; Posterior vitreous detachment         Fundus Exam       Right Left   Disc Pink and sharp, temporal PPP, +cupping +cupping, mild tilt, PPP, Pink and Sharp   C/D Ratio 0.7 0.7   Macula Flat, Blunted foveal reflex, central cystic changes -- increased, mild Epiretinal membrane, mild RPE mottling, no frank heme Flat, blunted foveal reflex, Retinal pigment epithelial mottling, No heme or edema   Vessels attenuated, mild tortuosity attenuated, mild tortuosity   Periphery flat; attached; trace RPE changes; hair line Retinal tears at 3 and 4 o'clock w/ good laser scars surrounding -- basically ora; peripheral cystoid degeneration; ST cobblestoning, No new RT/RD, No heme attached; trace RPE changes, peripheral cystoid degneration, rare MA           Refraction     Wearing Rx       Sphere Cylinder Axis Add   Right  -1.25 +0.75 088 +2.50   Left -1.25 +0.75 110 +2.50    Type: PAL            IMAGING AND PROCEDURES  Imaging and Procedures for 04/30/17  OCT, Retina - OU - Both Eyes       Right Eye Quality was good. Central Foveal Thickness: 579. Progression has worsened. Findings include normal foveal contour, abnormal foveal contour, epiretinal membrane, intraretinal fluid, subretinal fluid (Interval re-development of CME with IRF/SRF +ERM).   Left Eye Quality was good. Central Foveal Thickness: 279. Progression has been stable. Findings include normal foveal contour, no IRF, no SRF.   Notes Images taken, stored on drive   Diagnosis / Impression:  OD - Interval re-development of CME with IRF/SRF +ERM OS - NFP; no IRF/SRF  Clinical management:  See below  Abbreviations: NFP - Normal foveal profile. CME - cystoid macular edema. PED - pigment epithelial detachment. IRF - intraretinal fluid. SRF - subretinal fluid. EZ - ellipsoid zone. ERM - epiretinal membrane. ORA - outer retinal atrophy. ORT - outer retinal tubulation. SRHM - subretinal hyper-reflective material             ASSESSMENT/PLAN:    ICD-10-CM   1. CME (cystoid macular edema), right  H35.351 OCT, Retina - OU - Both Eyes    2. 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 Posterior vitreous detachment of both eyes  H43.813     4. Type 2 diabetes mellitus without complication, without long-term current use of insulin (HCC)  E11.9     5. Long term (current) use of oral hypoglycemic drugs  Z79.84     6. Pseudophakia of both eyes  Z96.1     7. Epiretinal membrane (ERM) of right eye  H35.371      1. CME OD  - referred back by Dr. Delories for mild recurrent CME OD -- October 2024  - pt's last visit here prior to that was on 10.28.22  - FA OD (10.30.24) shows Perifoveal petaloid hyperfluorescent leakage and hyperfluorescent staining of disc -- consistent with CME - restarted on PF and Prolensa   on 10.30.24 - today, BCVA decreased to 20/50 from 20/40   - OCT shows interval re-development of IRF/CME -- pt completely stopped drops after last visit rather than following taper  - discussed possible STK OD -- will defer for now - re-start PF and Prolensa  QID OD  - f/u 3-4 weeks, DFE, OCT, possible STK  2. Peripheral retinal tears OD  - Small flap tears at ora, 0300 and 0400 oclock  - s/p barrier laser retinopexy OD, 03/18/17 -- good laser in place  - stable; No new RT/RD  - monitor  3. PVD / vitreous syneresis OU  - Discussed findings and prognosis  - No RT or RD on 360 scleral depressed exam OS  - Reviewed s/s of RT/RD  - Strict return precautions for any such RT/RD signs/symptoms   4,5. DM2 without retinopathy  - last A1C 7.5 (07.10.23)  - On oral meds  - Discussed importance of tight BS and BP control  6. Pseudophakia OU  - IOLs in good position OU -- beautiful surgeries by Dr. Camillo  - OD with history of anterior vitrectomy and 3 piece sulcus IOL  - monitor   7. Epiretinal membrane, right eye  - very mild ERM - asymptomatic, no metamorphopsia - no indication for surgery at this time - monitor  Ophthalmic Meds Ordered this visit:  Meds ordered this encounter  Medications   prednisoLONE  acetate (PRED FORTE ) 1 % ophthalmic suspension    Sig: Place 1 drop into the right eye 4 (four) times daily.  Dispense:  15 mL    Refill:  2   Bromfenac  Sodium 0.07 % SOLN    Sig: Place 1 drop into the right eye 4 (four) times daily.    Dispense:  3 mL    Refill:  6      Return for 3-4 weeks, CME OD, DFE, OCT, Possible Injxn.  There are no Patient Instructions on file for this visit.   Explained the diagnoses, plan, and follow up with the patient and they expressed understanding.  Patient expressed understanding of the importance of proper follow up care.   This document serves as a record of services personally performed by Redell JUDITHANN Hans, MD, PhD. It was created on  their behalf by Alan PARAS. Delores, OA an ophthalmic technician. The creation of this record is the provider's dictation and/or activities during the visit.    Electronically signed by: Alan PARAS. Delores, OA 07/22/23 12:48 PM   Redell JUDITHANN Hans, M.D., Ph.D. Diseases & Surgery of the Retina and Vitreous [facility]  I have reviewed the above documentation for accuracy and completeness, and I agree with the above. Redell JUDITHANN Hans, M.D., Ph.D. 07/22/23 12:59 PM   Abbreviations: M myopia (nearsighted); A astigmatism; H hyperopia (farsighted); P presbyopia; Mrx spectacle prescription;  CTL contact lenses; OD right eye; OS left eye; OU both eyes  XT exotropia; ET esotropia; PEK punctate epithelial keratitis; PEE punctate epithelial erosions; DES dry eye syndrome; MGD meibomian gland dysfunction; ATs artificial tears; PFAT's preservative free artificial tears; NSC nuclear sclerotic cataract; PSC posterior subcapsular cataract; ERM epi-retinal membrane; PVD posterior vitreous detachment; RD retinal detachment; DM diabetes mellitus; DR diabetic retinopathy; NPDR non-proliferative diabetic retinopathy; PDR proliferative diabetic retinopathy; CSME clinically significant macular edema; DME diabetic macular edema; dbh dot blot hemorrhages; CWS cotton wool spot; POAG primary open angle glaucoma; C/D cup-to-disc ratio; HVF humphrey visual field; GVF goldmann visual field; OCT optical coherence tomography; IOP intraocular pressure; BRVO Branch retinal vein occlusion; CRVO central retinal vein occlusion; CRAO central retinal artery occlusion; BRAO branch retinal artery occlusion; RT retinal tear; SB scleral buckle; PPV pars plana vitrectomy; VH Vitreous hemorrhage; PRP panretinal laser photocoagulation; IVK intravitreal kenalog ; VMT vitreomacular traction; MH Macular hole;  NVD neovascularization of the disc; NVE neovascularization elsewhere; AREDS age related eye disease study; ARMD age related macular  degeneration; POAG primary open angle glaucoma; EBMD epithelial/anterior basement membrane dystrophy; ACIOL anterior chamber intraocular lens; IOL intraocular lens; PCIOL posterior chamber intraocular lens; Phaco/IOL phacoemulsification with intraocular lens placement; PRK photorefractive keratectomy; LASIK laser assisted in situ keratomileusis; HTN hypertension; DM diabetes mellitus; COPD chronic obstructive pulmonary disease

## 2023-07-22 ENCOUNTER — Ambulatory Visit (INDEPENDENT_AMBULATORY_CARE_PROVIDER_SITE_OTHER): Payer: Medicare Other | Admitting: Ophthalmology

## 2023-07-22 ENCOUNTER — Encounter (INDEPENDENT_AMBULATORY_CARE_PROVIDER_SITE_OTHER): Payer: Self-pay | Admitting: Ophthalmology

## 2023-07-22 DIAGNOSIS — Z961 Presence of intraocular lens: Secondary | ICD-10-CM

## 2023-07-22 DIAGNOSIS — H35351 Cystoid macular degeneration, right eye: Secondary | ICD-10-CM | POA: Diagnosis not present

## 2023-07-22 DIAGNOSIS — H33331 Multiple defects of retina without detachment, right eye: Secondary | ICD-10-CM

## 2023-07-22 DIAGNOSIS — E119 Type 2 diabetes mellitus without complications: Secondary | ICD-10-CM

## 2023-07-22 DIAGNOSIS — H43813 Vitreous degeneration, bilateral: Secondary | ICD-10-CM | POA: Diagnosis not present

## 2023-07-22 DIAGNOSIS — Z7984 Long term (current) use of oral hypoglycemic drugs: Secondary | ICD-10-CM

## 2023-07-22 DIAGNOSIS — H35371 Puckering of macula, right eye: Secondary | ICD-10-CM

## 2023-07-22 MED ORDER — BROMFENAC SODIUM 0.07 % OP SOLN: 1.0000 [drp] | Freq: Four times a day (QID) | OPHTHALMIC | 6 refills | Status: DC

## 2023-07-22 MED ORDER — PREDNISOLONE ACETATE 1 % OP SUSP: 1.0000 [drp] | Freq: Four times a day (QID) | OPHTHALMIC | 2 refills | Status: DC

## 2023-07-24 ENCOUNTER — Encounter: Payer: Self-pay | Admitting: Podiatry

## 2023-07-24 ENCOUNTER — Ambulatory Visit (INDEPENDENT_AMBULATORY_CARE_PROVIDER_SITE_OTHER): Payer: Medicare Other | Admitting: Podiatry

## 2023-07-24 DIAGNOSIS — M79674 Pain in right toe(s): Secondary | ICD-10-CM

## 2023-07-24 DIAGNOSIS — B351 Tinea unguium: Secondary | ICD-10-CM

## 2023-07-24 DIAGNOSIS — M79675 Pain in left toe(s): Secondary | ICD-10-CM

## 2023-07-24 DIAGNOSIS — Q828 Other specified congenital malformations of skin: Secondary | ICD-10-CM | POA: Diagnosis not present

## 2023-07-24 DIAGNOSIS — E1149 Type 2 diabetes mellitus with other diabetic neurological complication: Secondary | ICD-10-CM

## 2023-07-24 NOTE — Progress Notes (Signed)
  Subjective: Chief Complaint  Patient presents with   [facility]    RM#13 [facility] patient states is here to follow up on feet in general is doing well.     73 year old female presents the office today for concerns of calluses to her right foot as well as her thick, elongated nails that she is not able to trim her self.  She denies any open lesions or any swelling or redness.  She has no new concerns today.  Last A1c was 7.0 on June 08, 2023  PCP: Leonce Sink, MD Endocrinologist: Delon Nigh, NPlast seen 06/08/2023   Objective: AAO x3, NAD DP/PT pulses palpable bilaterally, CRT less than 3 seconds Sensation intact with Semmes Weinstein monofilament Hyperkeratotic tissue submetatarsal 2 on the right foot without any underlying ulceration drainage or signs of infection.  There is no open lesions bilaterally. Nails are hypertrophic, dystrophic, brittle, discolored, elongated 10. No surrounding redness or drainage. Tenderness nails 1-5 bilaterally. No other open lesions or pre-ulcerative lesions are identified today. No pain with calf compression, swelling, warmth, erythema  Assessment: 72 year old female with hyperkeratotic lesions, symptomatic onychomycosis  Plan: -All treatment options discussed with the patient including all alternatives, risks, complications.  -Sharply debrided hyperkeratotic lesion x 1 without any complications or bleeding.  Continue moisturizer (uses miracle foot cream), offloading. -Sharply debrided nails x 10 without any complications or bleeding. -Given asymmetrical decrease sensation or nerve conduction test.  She wants to proceed with this.  Donnice JONELLE Fees DPM

## 2023-07-24 NOTE — Patient Instructions (Signed)

## 2023-08-05 NOTE — Progress Notes (Signed)
 [facility] - Clinic Note  08/18/2023     CHIEF COMPLAINT Patient presents for Retina Follow Up    HISTORY OF PRESENT ILLNESS: Crystal [name] is a 73 y.o. female who presents to the clinic today for:   HPI     Retina Follow Up   Patient presents with  Other.  In right eye.  This started 4 weeks ago.  I, the attending physician,  performed the HPI with the patient and updated documentation appropriately.        Comments   Patient here for 4 weeks retina follow up for CME OD. Patient states vision much better since got glasses. OD still bothersome. Swelling not gone down. Been using gray top drops QID OD can't fine Pink top drop. Hasn't been using pink top.       Last edited by Valdemar Rogue, MD on 08/18/2023 12:52 PM.    Pt states her vision has improved, she can't find her PF bottle, it has been lost for over a week, but she is using bromfenac  QID OD  Referring physician: Leonce Sink, MD [address] [place],  [place] [address]  HISTORICAL INFORMATION:   Selected notes from the MEDICAL RECORD NUMBER Referral from Dr. Camillo for retina eval: - last exam by [facility], 03/17/17 - 2 wk history of floaters OU - hx of DM2 since 2003 - pseudophakia OU (complex CEIOL OD, 12.14.16, OS: 11.28.16)   CURRENT MEDICATIONS: Current Outpatient Medications (Ophthalmic Drugs)  Medication Sig   Bromfenac  Sodium 0.07 % SOLN Place 1 drop into the right eye 4 (four) times daily.   ketorolac  (ACULAR ) 0.5 % ophthalmic solution Place 1 drop into the right eye 3 (three) times daily.   prednisoLONE  acetate (PRED FORTE ) 1 % ophthalmic suspension Place 1 drop into the right eye 4 (four) times daily.   No current facility-administered medications for this visit. (Ophthalmic Drugs)   Current Outpatient Medications (Other)  Medication Sig   albuterol  (VENTOLIN  HFA) 108 (90 Base) MCG/ACT inhaler Inhale into the lungs.   Alcohol Swabs (B-D SINGLE USE  SWABS REGULAR) PADS Use as directed to check blood sugar. DX E11.9   atenolol  (TENORMIN ) 25 MG tablet Take by mouth.   atorvastatin  (LIPITOR) 80 MG tablet Take 1 tablet by mouth once daily   BINAXNOW COVID-19 AG HOME TEST KIT Use as Directed on the Package   Blood Glucose Monitoring Suppl (ONETOUCH VERIO) w/Device KIT by Does not apply route.   budesonide -formoterol  (SYMBICORT ) 80-4.5 MCG/ACT inhaler Inhale 2 puffs into the lungs 2 (two) times a day.   buPROPion (WELLBUTRIN XL) 150 MG 24 hr tablet Take 1 tablet by mouth daily.   cephALEXin  (KEFLEX ) 500 MG capsule Take 1 capsule (500 mg total) by mouth 2 (two) times daily.   cetirizine  (ZYRTEC ) 10 MG tablet Take 1 tablet (10 mg total) by mouth daily as needed for allergies.   Cholecalciferol (VITAMIN D3) 2000 units TABS Take 1 tablet by mouth daily.   clotrimazole  (LOTRIMIN ) 1 % cream Apply to affected area 2 times daily for up to 4 weeks   dextromethorphan-guaiFENesin (MUCINEX DM) 30-600 MG 12hr tablet Take 1 to 2 tablets every 12 hours for cough and chest congestion.   diclofenac  (VOLTAREN ) 75 MG EC tablet Take 1 tablet (75 mg total) by mouth 2 (two) times daily.   dicyclomine  (BENTYL ) 10 MG capsule TAKE 1 CAPSULE BY MOUTH EVERY 4 TO 6 HOURS AS NEEDED FOR ABDOMINAL PAIN AND CRAMPING OR  DIARRHEA   diphenoxylate -atropine  (LOMOTIL ) 2.5-0.025 MG tablet Take 1 tablet by mouth every morning. If continued diarrhea in the next 2-3 days may increase to twice daily.   escitalopram  (LEXAPRO ) 20 MG tablet Take 1 tablet (20 mg total) by mouth daily.   fenofibrate  160 MG tablet TAKE 1 TABLET (160 MG TOTAL) DAILY.   fenofibrate  160 MG tablet Take 1 tablet by mouth daily.   fenofibrate  160 MG tablet Take by mouth.   fluticasone  (FLOVENT  HFA) 110 MCG/ACT inhaler Inhale 1-2 puffs into the lungs 2 (two) times daily.   gabapentin  (NEURONTIN ) 100 MG capsule Take 1 capsule (100 mg total) by mouth 3 (three) times daily.   gabapentin  (NEURONTIN ) 100 MG capsule Take by  mouth.   glimepiride  (AMARYL ) 1 MG tablet Take by mouth.   glucose blood (ONETOUCH VERIO) test strip Use as instructed to check blood sugar 2 times a day   hydrochlorothiazide  (HYDRODIURIL ) 25 MG tablet Take 1 tablet (25 mg total) by mouth daily.   ipratropium (ATROVENT) 0.06 % nasal spray SMARTSIG:2 Spray(s) Both Nares Morning-Night   Lancets Ultra Fine MISC Use to check blood sugar two times a day   levothyroxine  (SYNTHROID ) 25 MCG tablet TAKE 1 TABLET BY MOUTH ONCE DAILY BEFORE  BREAKFAST   levothyroxine  (SYNTHROID ) 25 MCG tablet Take by mouth.   losartan  (COZAAR ) 50 MG tablet Take 1 tablet by mouth once daily   losartan  (COZAAR ) 50 MG tablet Take 1 tablet by mouth daily.   losartan  (COZAAR ) 50 MG tablet Take by mouth.   montelukast  (SINGULAIR ) 10 MG tablet TAKE 1 TABLET BY MOUTH AT BEDTIME   montelukast  (SINGULAIR ) 10 MG tablet Take by mouth.   Multiple Vitamins-Minerals (CENTRUM SILVER PO) Take 1 tablet by mouth daily.     nitrofurantoin, macrocrystal-monohydrate, (MACROBID) 100 MG capsule Take 100 mg by mouth 2 (two) times daily.   oxyCODONE  (OXY IR/ROXICODONE ) 5 MG immediate release tablet Take 5 mg by mouth 4 (four) times daily as needed.   pantoprazole  (PROTONIX ) 40 MG tablet Take 1 tablet by mouth once daily   pantoprazole  (PROTONIX ) 40 MG tablet Take 1 tablet by mouth daily.   Probiotic Product (ALIGN) 4 MG CAPS Take 1 capsule by mouth daily. Reported on 07/02/2015   sitaGLIPtin  (JANUVIA ) 100 MG tablet Take 1 tablet (100 mg total) by mouth daily.   triamcinolone  (KENALOG ) 0.025 % ointment Apply 1 application topically daily.   valACYclovir  (VALTREX ) 1000 MG tablet Take 1 tablet (1,000 mg total) by mouth 3 (three) times daily.   donepezil  (ARICEPT ) 10 MG tablet Take 1 tablet (10 mg total) by mouth at bedtime.   No current facility-administered medications for this visit. (Other)   REVIEW OF SYSTEMS: ROS   Positive for: Gastrointestinal, Musculoskeletal, Endocrine, Eyes,  Respiratory, Psychiatric Negative for: Constitutional, Neurological, Skin, Genitourinary, HENT, Cardiovascular, Allergic/Imm, Heme/Lymph Last edited by Orval Asberry RAMAN, COA on 08/18/2023 12:33 PM.       ALLERGIES Allergies  Allergen Reactions   Compazine [Prochlorperazine Edisylate]     Comma for 3 days   Sulfa  Antibiotics Itching    severe itching   Tetanus Toxoid Other (See Comments)    convulsions   Gabapentin  (Once-Daily)    Metformin      Other reaction(s): GI Upset (intolerance) Extreme diarrhea   PAST MEDICAL HISTORY Past Medical History:  Diagnosis Date   Acute peptic ulcer of stomach    As a teenager   Allergy    dust, cock roach,grass,weeds,molds   Anxiety    Arthritis  knees, hands, shoulders   Asthma    02/08/10 FEV1 1.98 (80%), s/p saba 2.22 l/m (90%).nml   Broken ankle    right foot, wearing a boot   Cognitive change 06/10/2016   Dementia (HCC)    Depression    Diabetes mellitus 2003   Type II   Diverticulosis    Dyspepsia    Fatty liver 12/19/2010   GERD (gastroesophageal reflux disease)    Hearing loss    bilateral hearing aids    Herpes simplex virus (HSV) infection    High cholesterol    Hyperlipemia    Hypertension    Hypothyroidism 09/08/2013   IBS (irritable bowel syndrome)    Insomnia    Internal hemorrhoids    Joint pain 08/08/2016   Low back pain 08/10/2016   OSA (obstructive sleep apnea)    Does not use CPAP - old one broken, new sleep study to be scheduled and then given a new CPAP machine   Plantar fasciitis    no longer an issue   Sun-damaged skin 12/09/2016   SVD (spontaneous vaginal delivery)    x 4   Vaginal Pap smear, abnormal    Vitamin D  deficiency 03/04/2016   Wears partial dentures    upper dentures and lower partial   Past Surgical History:  Procedure Laterality Date   APPENDECTOMY     age 2   CATARACT EXTRACTION Bilateral 2016   Dr. Camillo   CATARACT EXTRACTION, BILATERAL     12/8 and 12/14   COLONOSCOPY   06/03/2010, 08/25/2011    severeal - polyps and diverticulosis    DILATATION & CURETTAGE/HYSTEROSCOPY WITH MYOSURE N/A 04/01/2018   Procedure: DILATATION & CURETTAGE/HYSTEROSCOPY WITH MYOSURE POLYPECTOMY;  Surgeon: Starla Harland BROCKS, MD;  Location: [facility];  Service: Gynecology;  Laterality: N/A;   EYE SURGERY     cateracts removed bilateral   GASTRECTOMY     partial - ulcer as a teen   HAND SURGERY Right    thumb    HEMORRHOID BANDING     KNEE ARTHROSCOPY Left 1998   NASAL SEPTUM SURGERY     RHINOPLASTY     TOE SURGERY Right 1995   FAMILY HISTORY Family History  Problem Relation Age of Onset   Diabetes Mother        type II   Heart attack Mother 62   Stroke Mother    Hyperlipidemia Mother    Colon cancer Sister    Cancer Sister        GI   Heart disease Sister    Asthma Maternal Grandmother    Mental illness Son        PTSD from military   Parkinsonism Father    Cancer Sister        stage 4 bladder cancer   Colon polyps Sister    Ulcers Other        bleeding gastric   Heart defect Other        child   Other Other        perforated bowel, child   Cancer Brother        pancreatic cancer   Amblyopia Neg Hx    Blindness Neg Hx    Cataracts Neg Hx    Glaucoma Neg Hx    Macular degeneration Neg Hx    Retinal detachment Neg Hx    Strabismus Neg Hx    Retinitis pigmentosa Neg Hx    SOCIAL HISTORY Social History   Tobacco Use  Smoking status: Never   Smokeless tobacco: Never  Vaping Use   Vaping status: Never Used  Substance Use Topics   Alcohol use: Yes    Alcohol/week: 0.0 standard drinks of alcohol    Comment: Rare occasions    Drug use: No       OPHTHALMIC EXAM: Base Eye Exam     Visual Acuity (Snellen - Linear)       Right Left   Dist cc 20/50 20/30   Dist ph cc 20/40 -1 20/25 -2    Correction: Glasses         Tonometry (Tonopen, 12:28 PM)       Right Left   Pressure 19 15         Pupils       Dark Light Shape React APD   Right 3 2  Irregular Brisk None   Left 3 2 Round Brisk None         Visual Fields (Counting fingers)       Left Right    Full Full         Extraocular Movement       Right Left    Full, Ortho Full, Ortho         Neuro/Psych     Oriented x3: Yes   Mood/Affect: Normal         Dilation     Both eyes: 1.0% Mydriacyl, 2.5% Phenylephrine @ 12:28 PM           Slit Lamp and Fundus Exam     External Exam       Right Left   External Normal Normal         Slit Lamp Exam       Right Left   Lids/Lashes Dermatochalasis - upper lid Dermatochalasis - upper lid temporally   Conjunctiva/Sclera White and quiet White and quiet   Cornea well healed temporal cataract wound, mild tear film debris Well healed cataract wounds, trace tear film debris   Anterior Chamber Vit strands to temporal cataract wound, deep and clear, no cell or flare deep and clear   Iris Slightly irregular -- mild peaking temporally, TID at temporal pupil margin Round and dilated; PPM at 0600   Lens 3 piece PC IOL in good position with open PC, mild vitreous prolapse around temporal lens edge PC IOL in good postition   Anterior Vitreous mild syneresis Mild syneresis; Posterior vitreous detachment         Fundus Exam       Right Left   Disc Pink and sharp, temporal PPP, +cupping +cupping, mild tilt, PPP, Pink and Sharp   C/D Ratio 0.7 0.7   Macula Flat, Blunted foveal reflex, central cystic changes -- improved, mild Epiretinal membrane, mild RPE mottling, no frank heme Flat, blunted foveal reflex, Retinal pigment epithelial mottling, No heme or edema   Vessels attenuated, Tortuous attenuated, mild tortuosity   Periphery flat; attached; trace RPE changes; hair line Retinal tears at 3 and 4 o'clock w/ good laser scars surrounding -- basically ora; peripheral cystoid degeneration; ST cobblestoning, No new RT/RD, No heme attached; trace RPE changes, peripheral cystoid degneration, rare MA            Refraction     Wearing Rx       Sphere Cylinder Axis Add   Right -1.25 +0.75 088 +2.50   Left -1.25 +0.75 110 +2.50    Type: PAL  IMAGING AND PROCEDURES  Imaging and Procedures for 04/30/17  OCT, Retina - OU - Both Eyes       Right Eye Quality was good. Central Foveal Thickness: 338. Progression has improved. Findings include normal foveal contour, no IRF, no SRF, epiretinal membrane (Interval resolution of CME -- IRF/SRF improved, +ERM).   Left Eye Quality was good. Central Foveal Thickness: 280. Progression has been stable. Findings include normal foveal contour, no IRF, no SRF.   Notes Images taken, stored on drive   Diagnosis / Impression:  OD - Interval resolution of CME -- IRF/SRF improved, +ERM OS - NFP; no IRF/SRF  Clinical management:  See below  Abbreviations: NFP - Normal foveal profile. CME - cystoid macular edema. PED - pigment epithelial detachment. IRF - intraretinal fluid. SRF - subretinal fluid. EZ - ellipsoid zone. ERM - epiretinal membrane. ORA - outer retinal atrophy. ORT - outer retinal tubulation. SRHM - subretinal hyper-reflective material             ASSESSMENT/PLAN:    ICD-10-CM   1. CME (cystoid macular edema), right  H35.351 OCT, Retina - OU - Both Eyes    2. 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 Posterior vitreous detachment of both eyes  H43.813     4. Type 2 diabetes mellitus without complication, without long-term current use of insulin (HCC)  E11.9     5. Long term (current) use of oral hypoglycemic drugs  Z79.84     6. Pseudophakia of both eyes  Z96.1     7. Epiretinal membrane (ERM) of right eye  H35.371      1. CME OD  - referred back by Dr. Delories for mild recurrent CME OD -- October 2024  - pt's last visit here prior to that was on 10.28.22  - FA OD (10.30.24) shows Perifoveal petaloid hyperfluorescent leakage and hyperfluorescent staining of disc -- consistent with  CME - restarted on PF and Prolensa  on 10.30.24 - today, BCVA 20/40 from 20/50  - OCT shows interval resolution of CME -- IRF/SRF improved  - pt reports PredForte bottle was lost about 1 wk ago, has been using Prolensa  QID OD - continue bromfenac  QID until pt finds PF bottle, then reduce to both PF and Prolensa  to BID OD - recurrent CME likely worsened by vitreous prolapse to temporal cataract wound OD -- may benefit from YAG vitreolysis if CME continues to recur  - f/u 6 weeks, DFE, OCT  2. Peripheral retinal tears OD  - Small flap tears at ora, 0300 and 0400 oclock  - s/p barrier laser retinopexy OD, 03/18/17 -- good laser in place  - stable; No new RT/RD  - monitor  3. PVD / vitreous syneresis OU  - Discussed findings and prognosis  - No RT or RD on 360 scleral depressed exam OS  - Reviewed s/s of RT/RD  - Strict return precautions for any such RT/RD signs/symptoms   4,5. DM2 without retinopathy  - last A1C 7.5 (07.10.23)  - On oral meds  - Discussed importance of tight BS and BP control  6. Pseudophakia OU  - IOLs in good position OU -- beautiful surgeries by Dr. Camillo  - OD with history of anterior vitrectomy and 3 piece sulcus IOL  - monitor   7. Epiretinal membrane, right eye  - very mild ERM - asymptomatic, no metamorphopsia - no indication for surgery at this time - monitor  Ophthalmic Meds Ordered this visit:  No orders of the defined types were placed in this encounter.     Return in about 6 weeks (around 09/29/2023) for f/u CME OD, DFE, OCT.  There are no Patient Instructions on file for this visit.   Explained the diagnoses, plan, and follow up with the patient and they expressed understanding.  Patient expressed understanding of the importance of proper follow up care.   This document serves as a record of services personally performed by Redell JUDITHANN Hans, MD, PhD. It was created on their behalf by Fredia GEANNIE Keens, COT an ophthalmic technician. The  creation of this record is the provider's dictation and/or activities during the visit.    Electronically signed by:  Arayah GEANNIE Keens, COT  08/18/23 1:19 PM  This document serves as a record of services personally performed by Redell JUDITHANN Hans, MD, PhD. It was created on their behalf by Alan PARAS. Delores, OA an ophthalmic technician. The creation of this record is the provider's dictation and/or activities during the visit.    Electronically signed by: Alan PARAS. Delores, OA 08/18/23 1:19 PM   Redell JUDITHANN Hans, M.D., Ph.D. Diseases & Surgery of the Retina and Vitreous [facility]  I have reviewed the above documentation for accuracy and completeness, and I agree with the above. Redell JUDITHANN Hans, M.D., Ph.D. 08/18/23 1:25 PM    Abbreviations: M myopia (nearsighted); A astigmatism; H hyperopia (farsighted); P presbyopia; Mrx spectacle prescription;  CTL contact lenses; OD right eye; OS left eye; OU both eyes  XT exotropia; ET esotropia; PEK punctate epithelial keratitis; PEE punctate epithelial erosions; DES dry eye syndrome; MGD meibomian gland dysfunction; ATs artificial tears; PFAT's preservative free artificial tears; NSC nuclear sclerotic cataract; PSC posterior subcapsular cataract; ERM epi-retinal membrane; PVD posterior vitreous detachment; RD retinal detachment; DM diabetes mellitus; DR diabetic retinopathy; NPDR non-proliferative diabetic retinopathy; PDR proliferative diabetic retinopathy; CSME clinically significant macular edema; DME diabetic macular edema; dbh dot blot hemorrhages; CWS cotton wool spot; POAG primary open angle glaucoma; C/D cup-to-disc ratio; HVF humphrey visual field; GVF goldmann visual field; OCT optical coherence tomography; IOP intraocular pressure; BRVO Branch retinal vein occlusion; CRVO central retinal vein occlusion; CRAO central retinal artery occlusion; BRAO branch retinal artery occlusion; RT retinal tear; SB scleral buckle; PPV pars plana  vitrectomy; VH Vitreous hemorrhage; PRP panretinal laser photocoagulation; IVK intravitreal kenalog ; VMT vitreomacular traction; MH Macular hole;  NVD neovascularization of the disc; NVE neovascularization elsewhere; AREDS age related eye disease study; ARMD age related macular degeneration; POAG primary open angle glaucoma; EBMD epithelial/anterior basement membrane dystrophy; ACIOL anterior chamber intraocular lens; IOL intraocular lens; PCIOL posterior chamber intraocular lens; Phaco/IOL phacoemulsification with intraocular lens placement; PRK photorefractive keratectomy; LASIK laser assisted in situ keratomileusis; HTN hypertension; DM diabetes mellitus; COPD chronic obstructive pulmonary disease

## 2023-08-18 ENCOUNTER — Encounter (INDEPENDENT_AMBULATORY_CARE_PROVIDER_SITE_OTHER): Payer: Self-pay | Admitting: Ophthalmology

## 2023-08-18 ENCOUNTER — Ambulatory Visit (INDEPENDENT_AMBULATORY_CARE_PROVIDER_SITE_OTHER): Payer: Medicare Other | Admitting: Ophthalmology

## 2023-08-18 DIAGNOSIS — H43813 Vitreous degeneration, bilateral: Secondary | ICD-10-CM

## 2023-08-18 DIAGNOSIS — H33331 Multiple defects of retina without detachment, right eye: Secondary | ICD-10-CM | POA: Diagnosis not present

## 2023-08-18 DIAGNOSIS — Z961 Presence of intraocular lens: Secondary | ICD-10-CM

## 2023-08-18 DIAGNOSIS — Z7984 Long term (current) use of oral hypoglycemic drugs: Secondary | ICD-10-CM

## 2023-08-18 DIAGNOSIS — H35351 Cystoid macular degeneration, right eye: Secondary | ICD-10-CM

## 2023-08-18 DIAGNOSIS — H35371 Puckering of macula, right eye: Secondary | ICD-10-CM

## 2023-08-18 DIAGNOSIS — E119 Type 2 diabetes mellitus without complications: Secondary | ICD-10-CM | POA: Diagnosis not present

## 2023-09-15 NOTE — Progress Notes (Signed)
 [facility] - Clinic Note  09/29/2023     CHIEF COMPLAINT Patient presents for Retina Follow Up    HISTORY OF PRESENT ILLNESS: Crystal [name] is a 73 y.o. female who presents to the clinic today for:   HPI     Retina Follow Up   Patient presents with  Other.  In right eye.  Severity is moderate.  Duration of 6 weeks.  Since onset it is stable.  I, the attending physician,  performed the HPI with the patient and updated documentation appropriately.        Comments   6 week retina eval. Patient states vision is no worse. Patient states she has some pain in the eye and is using her drops       Last edited by Rennis Chris, MD on 10/02/2023 11:50 PM.    Pt states she is using PF and Prolensa BID OD, she states her right eye is irritated and hurts  Referring physician: Harvie Heck, MD [address] [place],  [place] [address]  HISTORICAL INFORMATION:   Selected notes from the MEDICAL RECORD NUMBER Referral from Dr. Hazle Quant for retina eval: - last exam by [facility], 03/17/17 - 2 wk history of floaters OU - hx of DM2 since 2003 - pseudophakia OU (complex CEIOL OD, 12.14.16, OS: 11.28.16)   CURRENT MEDICATIONS: Current Outpatient Medications (Ophthalmic Drugs)  Medication Sig   Bromfenac Sodium 0.07 % SOLN Place 1 drop into the right eye 4 (four) times daily.   ketorolac (ACULAR) 0.5 % ophthalmic solution Place 1 drop into the right eye 3 (three) times daily.   prednisoLONE acetate (PRED FORTE) 1 % ophthalmic suspension Place 1 drop into the right eye 4 (four) times daily.   No current facility-administered medications for this visit. (Ophthalmic Drugs)   Current Outpatient Medications (Other)  Medication Sig   albuterol (VENTOLIN HFA) 108 (90 Base) MCG/ACT inhaler Inhale into the lungs.   Alcohol Swabs (B-D SINGLE USE SWABS REGULAR) PADS Use as directed to check blood sugar. DX E11.9   atenolol (TENORMIN) 25 MG tablet Take  by mouth.   atorvastatin (LIPITOR) 80 MG tablet Take 1 tablet by mouth once daily   BINAXNOW COVID-19 AG HOME TEST KIT Use as Directed on the Package   Blood Glucose Monitoring Suppl (ONETOUCH VERIO) w/Device KIT by Does not apply route.   budesonide-formoterol (SYMBICORT) 80-4.5 MCG/ACT inhaler Inhale 2 puffs into the lungs 2 (two) times a day.   buPROPion (WELLBUTRIN XL) 150 MG 24 hr tablet Take 1 tablet by mouth daily.   cephALEXin (KEFLEX) 500 MG capsule Take 1 capsule (500 mg total) by mouth 2 (two) times daily.   cetirizine (ZYRTEC) 10 MG tablet Take 1 tablet (10 mg total) by mouth daily as needed for allergies.   Cholecalciferol (VITAMIN D3) 2000 units TABS Take 1 tablet by mouth daily.   clotrimazole (LOTRIMIN) 1 % cream Apply to affected area 2 times daily for up to 4 weeks   dextromethorphan-guaiFENesin (MUCINEX DM) 30-600 MG 12hr tablet Take 1 to 2 tablets every 12 hours for cough and chest congestion.   diclofenac (VOLTAREN) 75 MG EC tablet Take 1 tablet (75 mg total) by mouth 2 (two) times daily.   dicyclomine (BENTYL) 10 MG capsule TAKE 1 CAPSULE BY MOUTH EVERY 4 TO 6 HOURS AS NEEDED FOR ABDOMINAL PAIN AND CRAMPING OR DIARRHEA   diphenoxylate-atropine (LOMOTIL) 2.5-0.025 MG tablet Take 1 tablet by mouth every morning. If continued diarrhea  in the next 2-3 days may increase to twice daily.   escitalopram (LEXAPRO) 20 MG tablet Take 1 tablet (20 mg total) by mouth daily.   fenofibrate 160 MG tablet TAKE 1 TABLET (160 MG TOTAL) DAILY.   fenofibrate 160 MG tablet Take 1 tablet by mouth daily.   fluticasone (FLOVENT HFA) 110 MCG/ACT inhaler Inhale 1-2 puffs into the lungs 2 (two) times daily.   gabapentin (NEURONTIN) 100 MG capsule Take 1 capsule (100 mg total) by mouth 3 (three) times daily.   glimepiride (AMARYL) 1 MG tablet Take by mouth.   glucose blood (ONETOUCH VERIO) test strip Use as instructed to check blood sugar 2 times a day   hydrochlorothiazide (HYDRODIURIL) 25 MG tablet  Take 1 tablet (25 mg total) by mouth daily.   ipratropium (ATROVENT) 0.06 % nasal spray SMARTSIG:2 Spray(s) Both Nares Morning-Night   Lancets Ultra Fine MISC Use to check blood sugar two times a day   levothyroxine (SYNTHROID) 25 MCG tablet TAKE 1 TABLET BY MOUTH ONCE DAILY BEFORE  BREAKFAST   losartan (COZAAR) 50 MG tablet Take 1 tablet by mouth daily.   montelukast (SINGULAIR) 10 MG tablet TAKE 1 TABLET BY MOUTH AT BEDTIME   Multiple Vitamins-Minerals (CENTRUM SILVER PO) Take 1 tablet by mouth daily.     nitrofurantoin, macrocrystal-monohydrate, (MACROBID) 100 MG capsule Take 100 mg by mouth 2 (two) times daily.   oxyCODONE (OXY IR/ROXICODONE) 5 MG immediate release tablet Take 5 mg by mouth 4 (four) times daily as needed.   pantoprazole (PROTONIX) 40 MG tablet Take 1 tablet by mouth once daily   Probiotic Product (ALIGN) 4 MG CAPS Take 1 capsule by mouth daily. Reported on 07/02/2015   sitaGLIPtin (JANUVIA) 100 MG tablet Take 1 tablet (100 mg total) by mouth daily.   triamcinolone (KENALOG) 0.025 % ointment Apply 1 application topically daily.   valACYclovir (VALTREX) 1000 MG tablet Take 1 tablet (1,000 mg total) by mouth 3 (three) times daily.   donepezil (ARICEPT) 10 MG tablet Take 1 tablet (10 mg total) by mouth at bedtime.   fenofibrate 160 MG tablet Take by mouth.   gabapentin (NEURONTIN) 100 MG capsule Take by mouth.   levothyroxine (SYNTHROID) 25 MCG tablet Take by mouth.   losartan (COZAAR) 50 MG tablet Take 1 tablet by mouth once daily   losartan (COZAAR) 50 MG tablet Take by mouth.   montelukast (SINGULAIR) 10 MG tablet Take by mouth.   pantoprazole (PROTONIX) 40 MG tablet Take 1 tablet by mouth daily.   No current facility-administered medications for this visit. (Other)   REVIEW OF SYSTEMS: ROS   Positive for: Gastrointestinal, Musculoskeletal, Endocrine, Eyes, Respiratory, Psychiatric Negative for: Constitutional, Neurological, Skin, Genitourinary, HENT, Cardiovascular,  Allergic/Imm, Heme/Lymph Last edited by Lana Fish, COT on 09/29/2023  9:38 AM.     ALLERGIES Allergies  Allergen Reactions   Compazine [Prochlorperazine Edisylate]     Comma for 3 days   Sulfa Antibiotics Itching    severe itching   Tetanus Toxoid Other (See Comments)    convulsions   Gabapentin (Once-Daily)    Metformin     Other reaction(s): GI Upset (intolerance) Extreme diarrhea   PAST MEDICAL HISTORY Past Medical History:  Diagnosis Date   Acute peptic ulcer of stomach    As a teenager   Allergy    dust, cock roach,grass,weeds,molds   Anxiety    Arthritis    knees, hands, shoulders   Asthma    02/08/10 FEV1 1.98 (80%), s/p saba 2.22  l/m (90%).nml   Broken ankle    right foot, wearing a boot   Cognitive change 06/10/2016   Dementia (HCC)    Depression    Diabetes mellitus 2003   Type II   Diverticulosis    Dyspepsia    Fatty liver 12/19/2010   GERD (gastroesophageal reflux disease)    Hearing loss    bilateral hearing aids    Herpes simplex virus (HSV) infection    High cholesterol    Hyperlipemia    Hypertension    Hypothyroidism 09/08/2013   IBS (irritable bowel syndrome)    Insomnia    Internal hemorrhoids    Joint pain 08/08/2016   Low back pain 08/10/2016   OSA (obstructive sleep apnea)    Does not use CPAP - old one broken, new sleep study to be scheduled and then given a new CPAP machine   Plantar fasciitis    no longer an issue   Sun-damaged skin 12/09/2016   SVD (spontaneous vaginal delivery)    x 4   Vaginal Pap smear, abnormal    Vitamin D deficiency 03/04/2016   Wears partial dentures    upper dentures and lower partial   Past Surgical History:  Procedure Laterality Date   APPENDECTOMY     age 86   CATARACT EXTRACTION Bilateral 2016   Dr. Hazle Quant   CATARACT EXTRACTION, BILATERAL     12/8 and 12/14   COLONOSCOPY  06/03/2010, 08/25/2011    severeal - polyps and diverticulosis    DILATATION & CURETTAGE/HYSTEROSCOPY WITH MYOSURE N/A  04/01/2018   Procedure: DILATATION & CURETTAGE/HYSTEROSCOPY WITH MYOSURE POLYPECTOMY;  Surgeon: Allie Bossier, MD;  Location: [facility];  Service: Gynecology;  Laterality: N/A;   EYE SURGERY     cateracts removed bilateral   GASTRECTOMY     partial - ulcer as a teen   HAND SURGERY Right    thumb    HEMORRHOID BANDING     KNEE ARTHROSCOPY Left 1998   NASAL SEPTUM SURGERY     RHINOPLASTY     TOE SURGERY Right 1995   FAMILY HISTORY Family History  Problem Relation Age of Onset   Diabetes Mother        type II   Heart attack Mother 40   Stroke Mother    Hyperlipidemia Mother    Colon cancer Sister    Cancer Sister        GI   Heart disease Sister    Asthma Maternal Grandmother    Mental illness Son        PTSD from military   Parkinsonism Father    Cancer Sister        stage 4 bladder cancer   Colon polyps Sister    Ulcers Other        bleeding gastric   Heart defect Other        child   Other Other        perforated bowel, child   Cancer Brother        pancreatic cancer   Amblyopia Neg Hx    Blindness Neg Hx    Cataracts Neg Hx    Glaucoma Neg Hx    Macular degeneration Neg Hx    Retinal detachment Neg Hx    Strabismus Neg Hx    Retinitis pigmentosa Neg Hx    SOCIAL HISTORY Social History   Tobacco Use   Smoking status: Never   Smokeless tobacco: Never  Vaping Use   Vaping status:  Never Used  Substance Use Topics   Alcohol use: Yes    Alcohol/week: 0.0 standard drinks of alcohol    Comment: Rare occasions    Drug use: No       OPHTHALMIC EXAM: Base Eye Exam     Visual Acuity (Snellen - Linear)       Right Left   Dist cc 20/40 20/40   Dist ph cc 20/30 20/20- 2    Correction: Glasses         Tonometry (Tonopen, 9:41 AM)       Right Left   Pressure 18 15         Pupils       Dark Light Shape React APD   Right 3 2 Irregular Brisk None   Left 3 2 Irregular Brisk None         Visual Fields (Counting fingers)       Left Right     Full Full         Extraocular Movement       Right Left    Full, Ortho Full, Ortho         Neuro/Psych     Oriented x3: Yes   Mood/Affect: Normal         Dilation     Both eyes: 1.0% Mydriacyl, 2.5% Phenylephrine @ 9:41 AM           Slit Lamp and Fundus Exam     External Exam       Right Left   External Normal Normal         Slit Lamp Exam       Right Left   Lids/Lashes Dermatochalasis - upper lid Dermatochalasis - upper lid temporally   Conjunctiva/Sclera White and quiet White and quiet   Cornea well healed temporal cataract wound Well healed cataract wounds, trace tear film debris   Anterior Chamber Vit strands to temporal cataract wound, deep and clear, no cell or flare deep and clear   Iris Slightly irregular -- mild peaking temporally, TID at temporal pupil margin Round and dilated; PPM at 0600   Lens 3 piece PC IOL in good position with open PC, mild vitreous prolapse around temporal lens edge PC IOL in good postition   Anterior Vitreous mild syneresis Mild syneresis; Posterior vitreous detachment         Fundus Exam       Right Left   Disc Pink and sharp, temporal PPP, +cupping +cupping, mild tilt, PPP, Pink and Sharp   C/D Ratio 0.7 0.7   Macula Flat, Blunted foveal reflex, central cystic changes -- improved, mild Epiretinal membrane, mild RPE mottling, no frank heme Flat, blunted foveal reflex, Retinal pigment epithelial mottling, No heme or edema   Vessels attenuated, mild tortuosity attenuated, mild tortuosity   Periphery flat; attached; trace RPE changes; hair line Retinal tears at 3 and 4 o'clock w/ good laser scars surrounding -- basically ora; peripheral cystoid degeneration; ST cobblestoning, No new RT/RD, No heme attached; trace RPE changes, peripheral cystoid degneration, rare MA           Refraction     Wearing Rx       Sphere Cylinder Axis Add   Right -1.25 +0.75 088 +2.50   Left -1.25 +0.75 110 +2.50    Type: PAL             IMAGING AND PROCEDURES  Imaging and Procedures for 04/30/17  OCT, Retina - OU - Both Eyes  Right Eye Quality was good. Central Foveal Thickness: 321. Progression has been stable. Findings include normal foveal contour, no IRF, no SRF, epiretinal membrane (stable resolution of CME, +ERM).   Left Eye Quality was good. Central Foveal Thickness: 275. Progression has been stable. Findings include normal foveal contour, no IRF, no SRF.   Notes Images taken, stored on drive   Diagnosis / Impression:  OD - stable resolution of CME, +ERM OS - NFP; no IRF/SRF  Clinical management:  See below  Abbreviations: NFP - Normal foveal profile. CME - cystoid macular edema. PED - pigment epithelial detachment. IRF - intraretinal fluid. SRF - subretinal fluid. EZ - ellipsoid zone. ERM - epiretinal membrane. ORA - outer retinal atrophy. ORT - outer retinal tubulation. SRHM - subretinal hyper-reflective material             ASSESSMENT/PLAN:    ICD-10-CM   1. CME (cystoid macular edema), right  H35.351 OCT, Retina - OU - Both Eyes    2. 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 Posterior vitreous detachment of both eyes  H43.813     4. Type 2 diabetes mellitus without complication, without long-term current use of insulin (HCC)  E11.9     5. Long term (current) use of oral hypoglycemic drugs  Z79.84     6. Pseudophakia of both eyes  Z96.1     7. Epiretinal membrane (ERM) of right eye  H35.371      1. CME OD  - referred back by Dr. Hanley Ben for mild recurrent CME OD -- October 2024  - pt's last visit here prior to that was on 10.28.22  - FA OD (10.30.24) shows Perifoveal petaloid hyperfluorescent leakage and hyperfluorescent staining of disc -- consistent with CME - restarted on PF and Prolensa on 10.30.24 - today, BCVA 20/30 from 20/40  - OCT shows stable resolution of CME on PF and Prolensa BID OD - recurrent CME likely worsened by vitreous  prolapse to temporal cataract wound OD -- may benefit from YAG vitreolysis if CME continues to recur - dec PF and Prolensa to qdaily OD  - f/u 6 weeks, DFE, OCT  2. Peripheral retinal tears OD  - Small flap tears at ora, 0300 and 0400 oclock  - s/p barrier laser retinopexy OD, 03/18/17 -- good laser in place  - stable; No new RT/RD  - monitor  3. PVD / vitreous syneresis OU  - Discussed findings and prognosis  - No RT or RD on 360 scleral depressed exam OS  - Reviewed s/s of RT/RD  - Strict return precautions for any such RT/RD signs/symptoms   4,5. DM2 without retinopathy  - last A1C 7.5 (07.10.23)  - On oral meds  - Discussed importance of tight BS and BP control  6. Pseudophakia OU  - IOLs in good position OU -- beautiful surgeries by Dr. Hazle Quant  - OD with history of anterior vitrectomy and 3 piece sulcus IOL  - monitor   7. Epiretinal membrane, right eye  - very mild ERM - asymptomatic, no metamorphopsia - no indication for surgery at this time - monitor  Ophthalmic Meds Ordered this visit:  No orders of the defined types were placed in this encounter.     Return in about 6 weeks (around 11/10/2023) for f/u CME OU, DFE, OCT.  There are no Patient Instructions on file for this visit.   Explained the diagnoses, plan, and follow up with the patient and they expressed  understanding.  Patient expressed understanding of the importance of proper follow up care.   This document serves as a record of services personally performed by Crystal Chimera, MD, PhD. It was created on their behalf by Charlette Caffey, COT an ophthalmic technician. The creation of this record is the provider's dictation and/or activities during the visit.    Electronically signed by:  Charlette Caffey, COT  10/02/23 11:52 PM  This document serves as a record of services personally performed by Crystal Chimera, MD, PhD. It was created on their behalf by Glee Arvin. Manson Passey, OA an ophthalmic  technician. The creation of this record is the provider's dictation and/or activities during the visit.    Electronically signed by: Glee Arvin. Manson Passey, OA 10/02/23 11:52 PM   Crystal [name], M.D., Ph.D. Diseases & Surgery of the Retina and Vitreous [facility]  I have reviewed the above documentation for accuracy and completeness, and I agree with the above. Crystal [name], M.D., Ph.D. 10/02/23 11:59 PM   Abbreviations: M myopia (nearsighted); A astigmatism; H hyperopia (farsighted); P presbyopia; Mrx spectacle prescription;  CTL contact lenses; OD right eye; OS left eye; OU both eyes  XT exotropia; ET esotropia; PEK punctate epithelial keratitis; PEE punctate epithelial erosions; DES dry eye syndrome; MGD meibomian gland dysfunction; ATs artificial tears; PFAT's preservative free artificial tears; NSC nuclear sclerotic cataract; PSC posterior subcapsular cataract; ERM epi-retinal membrane; PVD posterior vitreous detachment; RD retinal detachment; DM diabetes mellitus; DR diabetic retinopathy; NPDR non-proliferative diabetic retinopathy; PDR proliferative diabetic retinopathy; CSME clinically significant macular edema; DME diabetic macular edema; dbh dot blot hemorrhages; CWS cotton wool spot; POAG primary open angle glaucoma; C/D cup-to-disc ratio; HVF humphrey visual field; GVF goldmann visual field; OCT optical coherence tomography; IOP intraocular pressure; BRVO Branch retinal vein occlusion; CRVO central retinal vein occlusion; CRAO central retinal artery occlusion; BRAO branch retinal artery occlusion; RT retinal tear; SB scleral buckle; PPV pars plana vitrectomy; VH Vitreous hemorrhage; PRP panretinal laser photocoagulation; IVK intravitreal kenalog; VMT vitreomacular traction; MH Macular hole;  NVD neovascularization of the disc; NVE neovascularization elsewhere; AREDS age related eye disease study; ARMD age related macular degeneration; POAG primary open angle glaucoma;  EBMD epithelial/anterior basement membrane dystrophy; ACIOL anterior chamber intraocular lens; IOL intraocular lens; PCIOL posterior chamber intraocular lens; Phaco/IOL phacoemulsification with intraocular lens placement; PRK photorefractive keratectomy; LASIK laser assisted in situ keratomileusis; HTN hypertension; DM diabetes mellitus; COPD chronic obstructive pulmonary disease

## 2023-09-29 ENCOUNTER — Encounter (INDEPENDENT_AMBULATORY_CARE_PROVIDER_SITE_OTHER): Payer: Self-pay | Admitting: Ophthalmology

## 2023-09-29 ENCOUNTER — Ambulatory Visit (INDEPENDENT_AMBULATORY_CARE_PROVIDER_SITE_OTHER): Payer: Medicare Other | Admitting: Ophthalmology

## 2023-09-29 DIAGNOSIS — H43813 Vitreous degeneration, bilateral: Secondary | ICD-10-CM | POA: Diagnosis not present

## 2023-09-29 DIAGNOSIS — H35351 Cystoid macular degeneration, right eye: Secondary | ICD-10-CM | POA: Diagnosis not present

## 2023-09-29 DIAGNOSIS — H35371 Puckering of macula, right eye: Secondary | ICD-10-CM | POA: Diagnosis not present

## 2023-09-29 DIAGNOSIS — H33331 Multiple defects of retina without detachment, right eye: Secondary | ICD-10-CM

## 2023-09-29 DIAGNOSIS — Z961 Presence of intraocular lens: Secondary | ICD-10-CM

## 2023-09-29 DIAGNOSIS — Z7984 Long term (current) use of oral hypoglycemic drugs: Secondary | ICD-10-CM

## 2023-09-29 DIAGNOSIS — E119 Type 2 diabetes mellitus without complications: Secondary | ICD-10-CM

## 2023-10-02 ENCOUNTER — Encounter (INDEPENDENT_AMBULATORY_CARE_PROVIDER_SITE_OTHER): Payer: Self-pay | Admitting: Ophthalmology

## 2023-10-26 ENCOUNTER — Ambulatory Visit: Payer: Medicare Other | Admitting: Podiatry

## 2023-10-26 ENCOUNTER — Encounter: Payer: Self-pay | Admitting: Podiatry

## 2023-10-26 DIAGNOSIS — B351 Tinea unguium: Secondary | ICD-10-CM | POA: Diagnosis not present

## 2023-10-26 DIAGNOSIS — M2041 Other hammer toe(s) (acquired), right foot: Secondary | ICD-10-CM

## 2023-10-26 DIAGNOSIS — M79675 Pain in left toe(s): Secondary | ICD-10-CM

## 2023-10-26 DIAGNOSIS — M79674 Pain in right toe(s): Secondary | ICD-10-CM | POA: Diagnosis not present

## 2023-10-26 DIAGNOSIS — E1149 Type 2 diabetes mellitus with other diabetic neurological complication: Secondary | ICD-10-CM

## 2023-10-26 DIAGNOSIS — Q828 Other specified congenital malformations of skin: Secondary | ICD-10-CM

## 2023-10-26 MED ORDER — CICLOPIROX 8 % EX SOLN: Freq: Every day | CUTANEOUS | 2 refills | Status: AC

## 2023-10-26 NOTE — Patient Instructions (Signed)
 Ciclopirox Topical Solution What is this medication? CICLOPIROX (sye kloe PEER ox) treats fungal infections of the nails. It belongs to a group of medications called antifungals. It will not treat infections caused by bacteria or viruses. This medicine may be used for other purposes; ask your health care provider or pharmacist if you have questions. COMMON BRAND NAME(S): Ciclodan Nail Solution, CNL8, [place] What should I tell my care team before I take this medication? They need to know if you have any of these conditions: Diabetes (high blood sugar) Immune system problems Organ transplant Receiving steroid inhalers, cream, or lotion Seizures Tingling of the fingers or toes or other nerve disorder An unusual or allergic reaction to ciclopirox, other medications, foods, dyes, or preservatives Pregnant or trying to get pregnant Breast-feeding How should I use this medication? This medication is for external use only. Do not take by mouth. Wash your hands before and after use. If you are treating your hands, only wash your hands before use. Do not get it in your eyes. If you do, rinse your eyes with plenty of cool tap water. Use it as directed on the prescription label at the same time every day. Do not use it more often than directed. Use the medication for the full course as directed by your care team, even if you think you are better. Do not stop using it unless your care team tells you to stop it early. Apply a thin film of the medication to the affected area. Talk to your care team about the use of this medication in children. While it may be prescribed for children as young as 12 years for selected conditions, precautions do apply. Overdosage: If you think you have taken too much of this medicine contact a poison control center or emergency room at once. NOTE: This medicine is only for you. Do not share this medicine with others. What if I miss a dose? If you miss a dose, use it as soon as  you can. If it is almost time for your next dose, use only that dose. Do not use double or extra doses. What may interact with this medication? Interactions are not expected. Do not use any other skin products without telling your care team. This list may not describe all possible interactions. Give your health care provider a list of all the medicines, herbs, non-prescription drugs, or dietary supplements you use. Also tell them if you smoke, drink alcohol, or use illegal drugs. Some items may interact with your medicine. What should I watch for while using this medication? Visit your care team for regular checks on your progress. It may be some time before you see the benefit from this medication. Do not use nail polish or other nail cosmetic products on the treated nails. Removal of the unattached, infected nail by your care team is needed with use of this medication. If you have diabetes or numbness in your fingers or toes, talk to your care team about proper nail care. What side effects may I notice from receiving this medication? Side effects that you should report to your care team as soon as possible: Allergic reactions--skin rash, itching, hives, swelling of the face, lips, tongue, or throat Burning, itching, crusting, or peeling of treated skin Side effects that usually do not require medical attention (report to your care team if they continue or are bothersome): Change in nail shape, thickness, or color Mild skin irritation, redness, or dryness This list may not describe all possible side  effects. Call your doctor for medical advice about side effects. You may report side effects to FDA at 1-800-FDA-1088. Where should I keep my medication? Keep out of the reach of children and pets. Store at room temperature between 20 and 25 degrees C (68 and 77 degrees F). This medication is flammable. Avoid exposure to heat, fire, flame, and smoking. Get rid of medications that are no longer needed  or have expired: Take the medication to a medication take-back program. Check with your pharmacy or law enforcement to find a location. If you cannot return the medication, check the label or package insert to see if the medication should be thrown out in the garbage or flushed down the toilet. If you are not sure, ask your care team. If it is safe to put in the trash, take the medication out of the container. Mix the medication with cat litter, dirt, coffee grounds, or other unwanted substance. Seal the mixture in a bag or container. Put it in the trash. NOTE: This sheet is a summary. It may not cover all possible information. If you have questions about this medicine, talk to your doctor, pharmacist, or health care provider.  2024 Elsevier/Gold Standard (2021-10-28 00:00:00)

## 2023-10-27 NOTE — Progress Notes (Signed)
 [facility] - Clinic Note  11/10/2023     CHIEF COMPLAINT Patient presents for Retina Follow Up    HISTORY OF PRESENT ILLNESS: Crystal [name] is a 73 y.o. female who presents to the clinic today for:   HPI     Retina Follow Up   Patient presents with  Other.  In right eye.  This started 7 years ago.  Severity is moderate.  Duration of 6 weeks.  Since onset it is stable.  I, the attending physician,  performed the HPI with the patient and updated documentation appropriately.        Comments   Pt presents for 6 week retina eval, CME OU. Patient states vision has been stable. Pt denies FOL/floaters/discomfort. Pt is using Ketorolac  and Prednisolone  every day but has not used either today. Pts last A1c was 6.9 on 09/08/2023.      Last edited by Ronelle Coffee, MD on 11/11/2023  9:21 AM.      Referring physician: Nash Bade, MD [address] [place] [address] [place],  [place] [address]  HISTORICAL INFORMATION:   Selected notes from the MEDICAL RECORD NUMBER Referral from Dr. Danley Dusky for retina eval: - last exam by [facility], 03/17/17 - 2 wk history of floaters OU - hx of DM2 since 2003 - pseudophakia OU (complex CEIOL OD, 12.14.16, OS: 11.28.16)   CURRENT MEDICATIONS: Current Outpatient Medications (Ophthalmic Drugs)  Medication Sig   ketorolac  (ACULAR ) 0.5 % ophthalmic solution Place 1 drop into the right eye 3 (three) times daily.   Bromfenac  Sodium 0.07 % SOLN Place 1 drop into the right eye 3 (three) times daily.   prednisoLONE  acetate (PRED FORTE ) 1 % ophthalmic suspension Place 1 drop into the right eye 3 (three) times daily.   No current facility-administered medications for this visit. (Ophthalmic Drugs)   Current Outpatient Medications (Other)  Medication Sig   albuterol  (VENTOLIN  HFA) 108 (90 Base) MCG/ACT inhaler Inhale into the lungs.   Alcohol Swabs (B-D SINGLE USE SWABS REGULAR) PADS Use as directed to check blood sugar. DX  E11.9   atenolol  (TENORMIN ) 25 MG tablet Take by mouth.   atorvastatin  (LIPITOR) 80 MG tablet Take 1 tablet by mouth once daily   BINAXNOW COVID-19 AG HOME TEST KIT Use as Directed on the Package   Blood Glucose Monitoring Suppl (ONETOUCH VERIO) w/Device KIT by Does not apply route.   budesonide -formoterol  (SYMBICORT ) 80-4.5 MCG/ACT inhaler Inhale 2 puffs into the lungs 2 (two) times a day.   buPROPion (WELLBUTRIN XL) 150 MG 24 hr tablet Take 1 tablet by mouth daily.   cephALEXin  (KEFLEX ) 500 MG capsule Take 1 capsule (500 mg total) by mouth 2 (two) times daily.   cetirizine  (ZYRTEC ) 10 MG tablet Take 1 tablet (10 mg total) by mouth daily as needed for allergies.   Cholecalciferol (VITAMIN D3) 2000 units TABS Take 1 tablet by mouth daily.   ciclopirox  (PENLAC ) 8 % solution Apply topically at bedtime. Apply over nail and surrounding skin. Apply daily over previous coat. After seven (7) days, may remove with alcohol and continue cycle.   clotrimazole  (LOTRIMIN ) 1 % cream Apply to affected area 2 times daily for up to 4 weeks   dextromethorphan-guaiFENesin (MUCINEX DM) 30-600 MG 12hr tablet Take 1 to 2 tablets every 12 hours for cough and chest congestion.   diclofenac  (VOLTAREN ) 75 MG EC tablet Take 1 tablet (75 mg total) by mouth 2 (two) times daily.   dicyclomine  (BENTYL ) 10 MG capsule  TAKE 1 CAPSULE BY MOUTH EVERY 4 TO 6 HOURS AS NEEDED FOR ABDOMINAL PAIN AND CRAMPING OR DIARRHEA   diphenoxylate -atropine  (LOMOTIL ) 2.5-0.025 MG tablet Take 1 tablet by mouth every morning. If continued diarrhea in the next 2-3 days may increase to twice daily.   escitalopram  (LEXAPRO ) 20 MG tablet Take 1 tablet (20 mg total) by mouth daily.   fenofibrate  160 MG tablet TAKE 1 TABLET (160 MG TOTAL) DAILY.   fenofibrate  160 MG tablet Take 1 tablet by mouth daily.   fenofibrate  160 MG tablet Take by mouth.   fluticasone  (FLOVENT  HFA) 110 MCG/ACT inhaler Inhale 1-2 puffs into the lungs 2 (two) times daily.    gabapentin  (NEURONTIN ) 100 MG capsule Take 1 capsule (100 mg total) by mouth 3 (three) times daily.   gabapentin  (NEURONTIN ) 100 MG capsule Take by mouth.   glimepiride  (AMARYL ) 1 MG tablet Take by mouth.   glucose blood (ONETOUCH VERIO) test strip Use as instructed to check blood sugar 2 times a day   hydrochlorothiazide  (HYDRODIURIL ) 25 MG tablet Take 1 tablet (25 mg total) by mouth daily.   ipratropium (ATROVENT) 0.06 % nasal spray SMARTSIG:2 Spray(s) Both Nares Morning-Night   Lancets Ultra Fine MISC Use to check blood sugar two times a day   levothyroxine  (SYNTHROID ) 25 MCG tablet TAKE 1 TABLET BY MOUTH ONCE DAILY BEFORE  BREAKFAST   levothyroxine  (SYNTHROID ) 25 MCG tablet Take by mouth.   losartan  (COZAAR ) 50 MG tablet Take 1 tablet by mouth once daily   losartan  (COZAAR ) 50 MG tablet Take 1 tablet by mouth daily.   losartan  (COZAAR ) 50 MG tablet Take by mouth.   montelukast  (SINGULAIR ) 10 MG tablet TAKE 1 TABLET BY MOUTH AT BEDTIME   Multiple Vitamins-Minerals (CENTRUM SILVER PO) Take 1 tablet by mouth daily.     nitrofurantoin, macrocrystal-monohydrate, (MACROBID) 100 MG capsule Take 100 mg by mouth 2 (two) times daily.   oxyCODONE  (OXY IR/ROXICODONE ) 5 MG immediate release tablet Take 5 mg by mouth 4 (four) times daily as needed.   pantoprazole  (PROTONIX ) 40 MG tablet Take 1 tablet by mouth once daily   pantoprazole  (PROTONIX ) 40 MG tablet Take 1 tablet by mouth daily.   Probiotic Product (ALIGN) 4 MG CAPS Take 1 capsule by mouth daily. Reported on 07/02/2015   sitaGLIPtin  (JANUVIA ) 100 MG tablet Take 1 tablet (100 mg total) by mouth daily.   triamcinolone  (KENALOG ) 0.025 % ointment Apply 1 application topically daily.   valACYclovir  (VALTREX ) 1000 MG tablet Take 1 tablet (1,000 mg total) by mouth 3 (three) times daily.   donepezil  (ARICEPT ) 10 MG tablet Take 1 tablet (10 mg total) by mouth at bedtime.   montelukast  (SINGULAIR ) 10 MG tablet Take by mouth.   No current  facility-administered medications for this visit. (Other)   REVIEW OF SYSTEMS: ROS   Positive for: Gastrointestinal, Musculoskeletal, Endocrine, Eyes, Respiratory, Psychiatric Negative for: Constitutional, Neurological, Skin, Genitourinary, HENT, Cardiovascular, Allergic/Imm, Heme/Lymph Last edited by Carrington Clack, COT on 11/10/2023  2:27 PM.     ALLERGIES Allergies  Allergen Reactions   Compazine [Prochlorperazine Edisylate]     Comma for 3 days   Sulfa  Antibiotics Itching    severe itching   Tetanus Toxoid Other (See Comments)    convulsions   Gabapentin  (Once-Daily)    Metformin      Other reaction(s): GI Upset (intolerance) Extreme diarrhea   PAST MEDICAL HISTORY Past Medical History:  Diagnosis Date   Acute peptic ulcer of stomach    As a  teenager   Allergy    dust, cock roach,grass,weeds,molds   Anxiety    Arthritis    knees, hands, shoulders   Asthma    02/08/10 FEV1 1.98 (80%), s/p saba 2.22 l/m (90%).nml   Broken ankle    right foot, wearing a boot   Cognitive change 06/10/2016   Dementia (HCC)    Depression    Diabetes mellitus 2003   Type II   Diverticulosis    Dyspepsia    Fatty liver 12/19/2010   GERD (gastroesophageal reflux disease)    Hearing loss    bilateral hearing aids    Herpes simplex virus (HSV) infection    High cholesterol    Hyperlipemia    Hypertension    Hypothyroidism 09/08/2013   IBS (irritable bowel syndrome)    Insomnia    Internal hemorrhoids    Joint pain 08/08/2016   Low back pain 08/10/2016   OSA (obstructive sleep apnea)    Does not use CPAP - old one broken, new sleep study to be scheduled and then given a new CPAP machine   Plantar fasciitis    no longer an issue   Sun-damaged skin 12/09/2016   SVD (spontaneous vaginal delivery)    x 4   Vaginal Pap smear, abnormal    Vitamin D  deficiency 03/04/2016   Wears partial dentures    upper dentures and lower partial   Past Surgical History:  Procedure Laterality Date    APPENDECTOMY     age 75   CATARACT EXTRACTION Bilateral 2016   Dr. Danley Dusky   CATARACT EXTRACTION, BILATERAL     12/8 and 12/14   COLONOSCOPY  06/03/2010, 08/25/2011    severeal - polyps and diverticulosis    DILATATION & CURETTAGE/HYSTEROSCOPY WITH MYOSURE N/A 04/01/2018   Procedure: DILATATION & CURETTAGE/HYSTEROSCOPY WITH MYOSURE POLYPECTOMY;  Surgeon: Ana Balling, MD;  Location: [facility];  Service: Gynecology;  Laterality: N/A;   EYE SURGERY     cateracts removed bilateral   GASTRECTOMY     partial - ulcer as a teen   HAND SURGERY Right    thumb    HEMORRHOID BANDING     KNEE ARTHROSCOPY Left 1998   NASAL SEPTUM SURGERY     RHINOPLASTY     TOE SURGERY Right 1995   FAMILY HISTORY Family History  Problem Relation Age of Onset   Diabetes Mother        type II   Heart attack Mother 84   Stroke Mother    Hyperlipidemia Mother    Colon cancer Sister    Cancer Sister        GI   Heart disease Sister    Asthma Maternal Grandmother    Mental illness Son        PTSD from military   Parkinsonism Father    Cancer Sister        stage 4 bladder cancer   Colon polyps Sister    Ulcers Other        bleeding gastric   Heart defect Other        child   Other Other        perforated bowel, child   Cancer Brother        pancreatic cancer   Amblyopia Neg Hx    Blindness Neg Hx    Cataracts Neg Hx    Glaucoma Neg Hx    Macular degeneration Neg Hx    Retinal detachment Neg Hx    Strabismus Neg  Hx    Retinitis pigmentosa Neg Hx    SOCIAL HISTORY Social History   Tobacco Use   Smoking status: Never   Smokeless tobacco: Never  Vaping Use   Vaping status: Never Used  Substance Use Topics   Alcohol use: Yes    Alcohol/week: 0.0 standard drinks of alcohol    Comment: Rare occasions    Drug use: No       OPHTHALMIC EXAM: Base Eye Exam     Visual Acuity (Snellen - Linear)       Right Left   Dist cc 20/40 +1 20/25 -2   Dist ph cc 20/30 +1 20/25 +2    Correction:  Glasses         Tonometry (Tonopen, 2:32 PM)       Right Left   Pressure 15 15         Pupils       Pupils Dark Light Shape React APD   Right PERRL 3 1 Irregular Brisk None   Left PERRL 3 1 Round Brisk None         Visual Fields       Left Right    Full Full         Extraocular Movement       Right Left    Full, Ortho Full, Ortho         Neuro/Psych     Oriented x3: Yes   Mood/Affect: Normal         Dilation     Both eyes: 1.0% Mydriacyl, 2.5% Phenylephrine @ 2:34 PM           Slit Lamp and Fundus Exam     External Exam       Right Left   External Normal Normal         Slit Lamp Exam       Right Left   Lids/Lashes Dermatochalasis - upper lid Dermatochalasis - upper lid temporally   Conjunctiva/Sclera White and quiet White and quiet   Cornea well healed temporal cataract wound Well healed cataract wounds, trace tear film debris   Anterior Chamber Vit strands to temporal cataract wound, deep and clear, no cell or flare deep and clear   Iris Slightly irregular -- mild peaking temporally, TID at temporal pupil margin Round and dilated; PPM at 0600   Lens 3 piece PC IOL in good position with open PC, mild vitreous prolapse around temporal lens edge PC IOL in good postition   Anterior Vitreous mild syneresis Mild syneresis; Posterior vitreous detachment         Fundus Exam       Right Left   Disc Pink and sharp, temporal PPP, +cupping +cupping, mild tilt, PPP, Pink and Sharp   C/D Ratio 0.7 0.7   Macula Flat, Blunted foveal reflex, central cystic changes -- slightly increased, mild Epiretinal membrane, mild RPE mottling, no frank heme Flat, blunted foveal reflex, Retinal pigment epithelial mottling, No heme or edema   Vessels attenuated, mild tortuosity attenuated, mild tortuosity   Periphery flat; attached; trace RPE changes; hair line Retinal tears at 3 and 4 o'clock w/ good laser scars surrounding -- basically ora; peripheral cystoid  degeneration; ST cobblestoning, No new RT/RD, No heme attached; trace RPE changes, peripheral cystoid degneration, rare MA           Refraction     Wearing Rx       Sphere Cylinder Axis Add   Right -1.25 +0.75 088 +  2.50   Left -1.25 +0.75 110 +2.50    Type: PAL            IMAGING AND PROCEDURES  Imaging and Procedures for 04/30/17  OCT, Retina - OU - Both Eyes       Right Eye Quality was good. Central Foveal Thickness: 353. Progression has worsened. Findings include normal foveal contour, no IRF, no SRF, epiretinal membrane (Mild interval increase in IRF / CME, +ERM).   Left Eye Quality was good. Central Foveal Thickness: 278. Progression has been stable. Findings include normal foveal contour, no IRF, no SRF.   Notes Images taken, stored on drive   Diagnosis / Impression:  OD - Mild interval increase in IRF / CME, +ERM OS - NFP; no IRF/SRF  Clinical management:  See below  Abbreviations: NFP - Normal foveal profile. CME - cystoid macular edema. PED - pigment epithelial detachment. IRF - intraretinal fluid. SRF - subretinal fluid. EZ - ellipsoid zone. ERM - epiretinal membrane. ORA - outer retinal atrophy. ORT - outer retinal tubulation. SRHM - subretinal hyper-reflective material             ASSESSMENT/PLAN:    ICD-10-CM   1. CME (cystoid macular edema), right  H35.351 OCT, Retina - OU - Both Eyes    2. 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 Posterior vitreous detachment of both eyes  H43.813     4. Type 2 diabetes mellitus without complication, without long-term current use of insulin (HCC)  E11.9     5. Long term (current) use of oral hypoglycemic drugs  Z79.84     6. Pseudophakia of both eyes  Z96.1      1. CME OD  - referred back by Dr. Nana Avers for mild recurrent CME OD -- October 2024  - pt's last visit here prior to that was on 10.28.22  - FA OD (10.30.24) shows Perifoveal petaloid hyperfluorescent leakage  and hyperfluorescent staining of disc -- consistent with CME - restarted on PF and Prolensa  on 10.30.24 - today, BCVA stable at 20/30  - OCT shows interval increase in CME on PF and Prolensa  QDaily OD - recurrent CME likely worsened by vitreous prolapse to temporal cataract wound OD -- may benefit from YAG vitreolysis if CME continues to recur - also discussed possible need for anti-inflammatory tx long-term - increase PF and Prolensa  to TID OD  - f/u 4 weeks, DFE, OCT  2. Peripheral retinal tears OD  - Small flap tears at ora, 0300 and 0400 oclock  - s/p barrier laser retinopexy OD, 03/18/17 -- good laser in place  - stable; No new RT/RD  - monitor  3. PVD / vitreous syneresis OU  - Discussed findings and prognosis  - No RT or RD on 360 scleral depressed exam OS  - Reviewed s/s of RT/RD  - Strict return precautions for any such RT/RD signs/symptoms   4,5. DM2 without retinopathy  - last A1C 7.0 (11.25.24)  - On oral meds  - Discussed importance of tight BS and BP control  6. Pseudophakia OU  - IOLs in good position OU -- beautiful surgeries by Dr. Danley Dusky  - OD with history of anterior vitrectomy and 3 piece sulcus IOL  - monitor   7. Epiretinal membrane, right eye  - very mild ERM - asymptomatic, no metamorphopsia - no indication for surgery at this time - monitor  Ophthalmic Meds Ordered this visit:  Meds ordered this encounter  Medications  prednisoLONE  acetate (PRED FORTE ) 1 % ophthalmic suspension    Sig: Place 1 drop into the right eye 3 (three) times daily.    Dispense:  15 mL    Refill:  2   Bromfenac  Sodium 0.07 % SOLN    Sig: Place 1 drop into the right eye 3 (three) times daily.    Dispense:  3 mL    Refill:  6      Return in about 4 weeks (around 12/08/2023) for f/u CME OD, DFE, OCT.  There are no Patient Instructions on file for this visit.   Explained the diagnoses, plan, and follow up with the patient and they expressed understanding.  Patient  expressed understanding of the importance of proper follow up care.   This document serves as a record of services personally performed by Jeanice Millard, MD, PhD. It was created on their behalf by Olene Berne, COT an ophthalmic technician. The creation of this record is the provider's dictation and/or activities during the visit.    Electronically signed by:  Olene Berne, COT  11/11/23 9:21 AM  This document serves as a record of services personally performed by Jeanice Millard, MD, PhD. It was created on their behalf by Morley Arabia. Bevin Bucks, OA an ophthalmic technician. The creation of this record is the provider's dictation and/or activities during the visit.    Electronically signed by: Morley Arabia. Bevin Bucks, OA 11/11/23 9:21 AM  Jeanice Millard, M.D., Ph.D. Diseases & Surgery of the Retina and Vitreous [facility]  I have reviewed the above documentation for accuracy and completeness, and I agree with the above. Jeanice Millard, M.D., Ph.D. 11/11/23 9:22 AM   Abbreviations: M myopia (nearsighted); A astigmatism; H hyperopia (farsighted); P presbyopia; Mrx spectacle prescription;  CTL contact lenses; OD right eye; OS left eye; OU both eyes  XT exotropia; ET esotropia; PEK punctate epithelial keratitis; PEE punctate epithelial erosions; DES dry eye syndrome; MGD meibomian gland dysfunction; ATs artificial tears; PFAT's preservative free artificial tears; NSC nuclear sclerotic cataract; PSC posterior subcapsular cataract; ERM epi-retinal membrane; PVD posterior vitreous detachment; RD retinal detachment; DM diabetes mellitus; DR diabetic retinopathy; NPDR non-proliferative diabetic retinopathy; PDR proliferative diabetic retinopathy; CSME clinically significant macular edema; DME diabetic macular edema; dbh dot blot hemorrhages; CWS cotton wool spot; POAG primary open angle glaucoma; C/D cup-to-disc ratio; HVF humphrey visual field; GVF goldmann visual field; OCT  optical coherence tomography; IOP intraocular pressure; BRVO Branch retinal vein occlusion; CRVO central retinal vein occlusion; CRAO central retinal artery occlusion; BRAO branch retinal artery occlusion; RT retinal tear; SB scleral buckle; PPV pars plana vitrectomy; VH Vitreous hemorrhage; PRP panretinal laser photocoagulation; IVK intravitreal kenalog ; VMT vitreomacular traction; MH Macular hole;  NVD neovascularization of the disc; NVE neovascularization elsewhere; AREDS age related eye disease study; ARMD age related macular degeneration; POAG primary open angle glaucoma; EBMD epithelial/anterior basement membrane dystrophy; ACIOL anterior chamber intraocular lens; IOL intraocular lens; PCIOL posterior chamber intraocular lens; Phaco/IOL phacoemulsification with intraocular lens placement; PRK photorefractive keratectomy; LASIK laser assisted in situ keratomileusis; HTN hypertension; DM diabetes mellitus; COPD chronic obstructive pulmonary disease

## 2023-10-28 NOTE — Progress Notes (Signed)
  Subjective: Chief Complaint  Patient presents with   [facility]    RM#13 DFC      73 year old female presents the office today for concerns of calluses to her right foot as well as her thick, elongated nails that she is not able to trim her self.  She denies any open lesions or any swelling or redness.    She is also interested in possibly having surgery for her right second toe given the contracture of the toe.  She wants to discuss this today.  She tried shoe modifications and offloading.  Last A1c was 6.9 on September 06, 2023  PCP: Nash Bade, MD Endocrinologist: Roxine Cordia, NP last seen 09/08/2023   Objective: AAO x3, NAD DP/PT pulses palpable bilaterally, CRT less than 3 seconds Sensation intact with Semmes Weinstein monofilament Hyperkeratotic tissue submetatarsal 2 on the right foot without any underlying ulceration drainage or signs of infection.  There is no open lesions bilaterally. Nails are hypertrophic, dystrophic, brittle, discolored, elongated 10. No surrounding redness or drainage. Tenderness nails 1-5 bilaterally. No other open lesions or pre-ulcerative lesions are identified today. The contracture present on the right second toe with tenderness palpation along the toe itself there is no area pinpoint tenderness identified otherwise. No pain with calf compression, swelling, warmth, erythema  Assessment: 73 year old female with hyperkeratotic lesions, symptomatic onychomycosis; fracture  Plan: Lesion -Sharply debrided hyperkeratotic lesion x 1 without any complications or bleeding.  Continue moisturizer (uses miracle foot cream), offloading.  Symptomatic onychomycosis -Sharply debrided nails x 10 without any complications or bleeding. -Penlac  Digital contracture right foot - Discussed with conservative as well as surgical options.  After discussion she would continue with conservative management for now.  Continue shoe modifications, offloading  padding.   Charity Conch DPM

## 2023-11-10 ENCOUNTER — Encounter (INDEPENDENT_AMBULATORY_CARE_PROVIDER_SITE_OTHER): Payer: Self-pay | Admitting: Ophthalmology

## 2023-11-10 ENCOUNTER — Ambulatory Visit (INDEPENDENT_AMBULATORY_CARE_PROVIDER_SITE_OTHER): Admitting: Ophthalmology

## 2023-11-10 DIAGNOSIS — H33331 Multiple defects of retina without detachment, right eye: Secondary | ICD-10-CM

## 2023-11-10 DIAGNOSIS — E119 Type 2 diabetes mellitus without complications: Secondary | ICD-10-CM

## 2023-11-10 DIAGNOSIS — H43813 Vitreous degeneration, bilateral: Secondary | ICD-10-CM

## 2023-11-10 DIAGNOSIS — Z7984 Long term (current) use of oral hypoglycemic drugs: Secondary | ICD-10-CM

## 2023-11-10 DIAGNOSIS — Z961 Presence of intraocular lens: Secondary | ICD-10-CM

## 2023-11-10 DIAGNOSIS — H35351 Cystoid macular degeneration, right eye: Secondary | ICD-10-CM | POA: Diagnosis not present

## 2023-11-10 MED ORDER — BROMFENAC SODIUM 0.07 % OP SOLN: 1.0000 [drp] | Freq: Three times a day (TID) | OPHTHALMIC | 6 refills | Status: DC

## 2023-11-10 MED ORDER — PREDNISOLONE ACETATE 1 % OP SUSP: 1.0000 [drp] | Freq: Three times a day (TID) | OPHTHALMIC | 2 refills | Status: DC

## 2023-11-11 ENCOUNTER — Encounter (INDEPENDENT_AMBULATORY_CARE_PROVIDER_SITE_OTHER): Payer: Self-pay | Admitting: Ophthalmology

## 2023-11-26 NOTE — Progress Notes (Signed)
 [facility] - Clinic Note  12/08/2023     CHIEF COMPLAINT Patient presents for Retina Follow Up    HISTORY OF PRESENT ILLNESS: Crystal [name] is a 73 y.o. female who presents to the clinic today for:   HPI     Retina Follow Up   Patient presents with  Other.  In right eye.  This started 4 weeks ago.        Comments   Patient here for 4 weeks retina follow up for CME OD. Patient states vision about the same. No eye pain. Using drops. Bromfenac  TID OD and PF TID OD.      Last edited by Sylvan Evener, COA on 12/08/2023  1:36 PM.       Referring physician: Nash Bade, MD [address] [place] [address] [place],  [place] [address]  HISTORICAL INFORMATION:   Selected notes from the MEDICAL RECORD NUMBER Referral from Dr. Danley Dusky for retina eval: - last exam by [facility], 03/17/17 - 2 wk history of floaters OU - hx of DM2 since 2003 - pseudophakia OU (complex CEIOL OD, 12.14.16, OS: 11.28.16)   CURRENT MEDICATIONS: Current Outpatient Medications (Ophthalmic Drugs)  Medication Sig   Bromfenac  Sodium 0.07 % SOLN Place 1 drop into the right eye 3 (three) times daily.   ketorolac  (ACULAR ) 0.5 % ophthalmic solution Place 1 drop into the right eye 3 (three) times daily.   prednisoLONE  acetate (PRED FORTE ) 1 % ophthalmic suspension Place 1 drop into the right eye 3 (three) times daily.   No current facility-administered medications for this visit. (Ophthalmic Drugs)   Current Outpatient Medications (Other)  Medication Sig   albuterol  (VENTOLIN  HFA) 108 (90 Base) MCG/ACT inhaler Inhale into the lungs.   Alcohol Swabs (B-D SINGLE USE SWABS REGULAR) PADS Use as directed to check blood sugar. DX E11.9   atenolol  (TENORMIN ) 25 MG tablet Take by mouth.   atorvastatin  (LIPITOR) 80 MG tablet Take 1 tablet by mouth once daily   BINAXNOW COVID-19 AG HOME TEST KIT Use as Directed on the Package   Blood Glucose Monitoring Suppl (ONETOUCH VERIO)  w/Device KIT by Does not apply route.   budesonide -formoterol  (SYMBICORT ) 80-4.5 MCG/ACT inhaler Inhale 2 puffs into the lungs 2 (two) times a day.   buPROPion (WELLBUTRIN XL) 150 MG 24 hr tablet Take 1 tablet by mouth daily.   cephALEXin  (KEFLEX ) 500 MG capsule Take 1 capsule (500 mg total) by mouth 2 (two) times daily.   cetirizine  (ZYRTEC ) 10 MG tablet Take 1 tablet (10 mg total) by mouth daily as needed for allergies.   Cholecalciferol (VITAMIN D3) 2000 units TABS Take 1 tablet by mouth daily.   ciclopirox  (PENLAC ) 8 % solution Apply topically at bedtime. Apply over nail and surrounding skin. Apply daily over previous coat. After seven (7) days, may remove with alcohol and continue cycle.   clotrimazole  (LOTRIMIN ) 1 % cream Apply to affected area 2 times daily for up to 4 weeks   dextromethorphan-guaiFENesin (MUCINEX DM) 30-600 MG 12hr tablet Take 1 to 2 tablets every 12 hours for cough and chest congestion.   diclofenac  (VOLTAREN ) 75 MG EC tablet Take 1 tablet (75 mg total) by mouth 2 (two) times daily.   dicyclomine  (BENTYL ) 10 MG capsule TAKE 1 CAPSULE BY MOUTH EVERY 4 TO 6 HOURS AS NEEDED FOR ABDOMINAL PAIN AND CRAMPING OR DIARRHEA   diphenoxylate -atropine  (LOMOTIL ) 2.5-0.025 MG tablet Take 1 tablet by mouth every morning. If continued diarrhea in the next  2-3 days may increase to twice daily.   escitalopram  (LEXAPRO ) 20 MG tablet Take 1 tablet (20 mg total) by mouth daily.   fenofibrate  160 MG tablet TAKE 1 TABLET (160 MG TOTAL) DAILY.   fenofibrate  160 MG tablet Take 1 tablet by mouth daily.   fenofibrate  160 MG tablet Take by mouth.   fluticasone  (FLOVENT  HFA) 110 MCG/ACT inhaler Inhale 1-2 puffs into the lungs 2 (two) times daily.   gabapentin  (NEURONTIN ) 100 MG capsule Take 1 capsule (100 mg total) by mouth 3 (three) times daily.   gabapentin  (NEURONTIN ) 100 MG capsule Take by mouth.   glimepiride  (AMARYL ) 1 MG tablet Take by mouth.   glucose blood (ONETOUCH VERIO) test strip Use as  instructed to check blood sugar 2 times a day   hydrochlorothiazide  (HYDRODIURIL ) 25 MG tablet Take 1 tablet (25 mg total) by mouth daily.   ipratropium (ATROVENT) 0.06 % nasal spray SMARTSIG:2 Spray(s) Both Nares Morning-Night   Lancets Ultra Fine MISC Use to check blood sugar two times a day   levothyroxine  (SYNTHROID ) 25 MCG tablet TAKE 1 TABLET BY MOUTH ONCE DAILY BEFORE  BREAKFAST   levothyroxine  (SYNTHROID ) 25 MCG tablet Take by mouth.   losartan  (COZAAR ) 50 MG tablet Take 1 tablet by mouth once daily   losartan  (COZAAR ) 50 MG tablet Take 1 tablet by mouth daily.   losartan  (COZAAR ) 50 MG tablet Take by mouth.   montelukast  (SINGULAIR ) 10 MG tablet TAKE 1 TABLET BY MOUTH AT BEDTIME   montelukast  (SINGULAIR ) 10 MG tablet Take by mouth.   Multiple Vitamins-Minerals (CENTRUM SILVER PO) Take 1 tablet by mouth daily.     nitrofurantoin, macrocrystal-monohydrate, (MACROBID) 100 MG capsule Take 100 mg by mouth 2 (two) times daily.   oxyCODONE  (OXY IR/ROXICODONE ) 5 MG immediate release tablet Take 5 mg by mouth 4 (four) times daily as needed.   pantoprazole  (PROTONIX ) 40 MG tablet Take 1 tablet by mouth once daily   pantoprazole  (PROTONIX ) 40 MG tablet Take 1 tablet by mouth daily.   Probiotic Product (ALIGN) 4 MG CAPS Take 1 capsule by mouth daily. Reported on 07/02/2015   sitaGLIPtin  (JANUVIA ) 100 MG tablet Take 1 tablet (100 mg total) by mouth daily.   triamcinolone  (KENALOG ) 0.025 % ointment Apply 1 application topically daily.   valACYclovir  (VALTREX ) 1000 MG tablet Take 1 tablet (1,000 mg total) by mouth 3 (three) times daily.   donepezil  (ARICEPT ) 10 MG tablet Take 1 tablet (10 mg total) by mouth at bedtime.   No current facility-administered medications for this visit. (Other)   REVIEW OF SYSTEMS: ROS   Positive for: Gastrointestinal, Musculoskeletal, Endocrine, Eyes, Respiratory, Psychiatric Negative for: Constitutional, Neurological, Skin, Genitourinary, HENT, Cardiovascular,  Allergic/Imm, Heme/Lymph Last edited by Sylvan Evener, COA on 12/08/2023  1:36 PM.      ALLERGIES Allergies  Allergen Reactions   Compazine [Prochlorperazine Edisylate]     Comma for 3 days   Sulfa  Antibiotics Itching    severe itching   Tetanus Toxoid Other (See Comments)    convulsions   Gabapentin  (Once-Daily)    Metformin      Other reaction(s): GI Upset (intolerance) Extreme diarrhea   PAST MEDICAL HISTORY Past Medical History:  Diagnosis Date   Acute peptic ulcer of stomach    As a teenager   Allergy    dust, cock roach,grass,weeds,molds   Anxiety    Arthritis    knees, hands, shoulders   Asthma    02/08/10 FEV1 1.98 (80%), s/p saba 2.22 l/m (90%).nml  Broken ankle    right foot, wearing a boot   Cognitive change 06/10/2016   Dementia (HCC)    Depression    Diabetes mellitus 2003   Type II   Diverticulosis    Dyspepsia    Fatty liver 12/19/2010   GERD (gastroesophageal reflux disease)    Hearing loss    bilateral hearing aids    Herpes simplex virus (HSV) infection    High cholesterol    Hyperlipemia    Hypertension    Hypothyroidism 09/08/2013   IBS (irritable bowel syndrome)    Insomnia    Internal hemorrhoids    Joint pain 08/08/2016   Low back pain 08/10/2016   OSA (obstructive sleep apnea)    Does not use CPAP - old one broken, new sleep study to be scheduled and then given a new CPAP machine   Plantar fasciitis    no longer an issue   Sun-damaged skin 12/09/2016   SVD (spontaneous vaginal delivery)    x 4   Vaginal Pap smear, abnormal    Vitamin D  deficiency 03/04/2016   Wears partial dentures    upper dentures and lower partial   Past Surgical History:  Procedure Laterality Date   APPENDECTOMY     age 38   CATARACT EXTRACTION Bilateral 2016   Dr. Danley Dusky   CATARACT EXTRACTION, BILATERAL     12/8 and 12/14   COLONOSCOPY  06/03/2010, 08/25/2011    severeal - polyps and diverticulosis    DILATATION & CURETTAGE/HYSTEROSCOPY WITH MYOSURE N/A  04/01/2018   Procedure: DILATATION & CURETTAGE/HYSTEROSCOPY WITH MYOSURE POLYPECTOMY;  Surgeon: Ana Balling, MD;  Location: [facility];  Service: Gynecology;  Laterality: N/A;   EYE SURGERY     cateracts removed bilateral   GASTRECTOMY     partial - ulcer as a teen   HAND SURGERY Right    thumb    HEMORRHOID BANDING     KNEE ARTHROSCOPY Left 1998   NASAL SEPTUM SURGERY     RHINOPLASTY     TOE SURGERY Right 1995   FAMILY HISTORY Family History  Problem Relation Age of Onset   Diabetes Mother        type II   Heart attack Mother 68   Stroke Mother    Hyperlipidemia Mother    Colon cancer Sister    Cancer Sister        GI   Heart disease Sister    Asthma Maternal Grandmother    Mental illness Son        PTSD from military   Parkinsonism Father    Cancer Sister        stage 4 bladder cancer   Colon polyps Sister    Ulcers Other        bleeding gastric   Heart defect Other        child   Other Other        perforated bowel, child   Cancer Brother        pancreatic cancer   Amblyopia Neg Hx    Blindness Neg Hx    Cataracts Neg Hx    Glaucoma Neg Hx    Macular degeneration Neg Hx    Retinal detachment Neg Hx    Strabismus Neg Hx    Retinitis pigmentosa Neg Hx    SOCIAL HISTORY Social History   Tobacco Use   Smoking status: Never   Smokeless tobacco: Never  Vaping Use   Vaping status: Never Used  Substance  Use Topics   Alcohol use: Yes    Alcohol/week: 0.0 standard drinks of alcohol    Comment: Rare occasions    Drug use: No       OPHTHALMIC EXAM: Base Eye Exam     Visual Acuity (Snellen - Linear)       Right Left   Dist cc 20/40 20/25   Dist ph cc NI     Correction: Glasses         Tonometry (Tonopen, 1:33 PM)       Right Left   Pressure 22 17         Pupils       Dark Light Shape React APD   Right 3 2 Irregular Brisk None   Left 3 2 Irregular Brisk None         Visual Fields (Counting fingers)       Left Right    Full Full          Extraocular Movement       Right Left    Full, Ortho Full, Ortho         Neuro/Psych     Oriented x3: Yes   Mood/Affect: Normal         Dilation     Both eyes: 1.0% Mydriacyl, 2.5% Phenylephrine @ 1:33 PM           Slit Lamp and Fundus Exam     External Exam       Right Left   External Normal Normal         Slit Lamp Exam       Right Left   Lids/Lashes Dermatochalasis - upper lid Dermatochalasis - upper lid temporally   Conjunctiva/Sclera White and quiet White and quiet   Cornea well healed temporal cataract wound Well healed cataract wounds, trace tear film debris   Anterior Chamber Vit strands to temporal cataract wound, deep and clear, no cell or flare deep and clear   Iris Slightly irregular -- mild peaking temporally, TID at temporal pupil margin Round and dilated; PPM at 0600   Lens 3 piece PC IOL in good position with open PC, mild vitreous prolapse around temporal lens edge PC IOL in good postition   Anterior Vitreous mild syneresis Mild syneresis; Posterior vitreous detachment         Fundus Exam       Right Left   Disc Pink and sharp, temporal PPP, +cupping +cupping, mild tilt, PPP, Pink and Sharp   C/D Ratio 0.7 0.7   Macula Flat, Blunted foveal reflex, central cystic changes -- slightly increased, mild Epiretinal membrane, mild RPE mottling, no frank heme Flat, blunted foveal reflex, Retinal pigment epithelial mottling, No heme or edema   Vessels attenuated, mild tortuosity attenuated, mild tortuosity   Periphery flat; attached; trace RPE changes; hair line Retinal tears at 3 and 4 o'clock w/ good laser scars surrounding -- basically ora; peripheral cystoid degeneration; ST cobblestoning, No new RT/RD, No heme attached; trace RPE changes, peripheral cystoid degneration, rare MA           Refraction     Wearing Rx       Sphere Cylinder Axis Add   Right -1.25 +0.75 088 +2.50   Left -1.25 +0.75 110 +2.50    Type: PAL             IMAGING AND PROCEDURES  Imaging and Procedures for 04/30/17          ASSESSMENT/PLAN:  ICD-10-CM   1. CME (cystoid macular edema), right  H35.351 OCT, Retina - OU - Both Eyes    2. 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 Posterior vitreous detachment of both eyes  H43.813     4. Type 2 diabetes mellitus without complication, without long-term current use of insulin (HCC)  E11.9     5. Long term (current) use of oral hypoglycemic drugs  Z79.84     6. Pseudophakia of both eyes  Z96.1       1. CME OD - referred back by Dr. Nana Avers for mild recurrent CME OD -- October 2024  - pt's last visit here prior to that was on 10.28.22 - FA OD (10.30.24) shows Perifoveal petaloid hyperfluorescent leakage and hyperfluorescent staining of disc -- consistent with CME - restarted on PF and Prolensa  on 10.30.24 - today, BCVA 20/40  - OCT shows interval increase in CME on PF and Prolensa  QDaily OD - recurrent CME likely worsened by vitreous prolapse to temporal cataract wound OD -- may benefit from YAG vitreolysis if CME continues to recur - also discussed possible need for anti-inflammatory tx long-term - decrease PF and Prolensa  to BID OD  - f/u 4-6 weeks, DFE, OCT  2. Peripheral retinal tears OD  - Small flap tears at ora, 0300 and 0400 oclock  - s/p barrier laser retinopexy OD, 03/18/17 -- good laser in place  - stable; No new RT/RD  - monitor  3. PVD / vitreous syneresis OU  - Discussed findings and prognosis  - No RT or RD on 360 scleral depressed exam OS  - Reviewed s/s of RT/RD  - Strict return precautions for any such RT/RD signs/symptoms   4,5. DM2 without retinopathy  - last A1C 6.9 (02.25.25), 7.0 (11.25.24)  - On oral meds  - Discussed importance of tight BS and BP control  6. Pseudophakia OU  - IOLs in good position OU -- beautiful surgeries by Dr. Danley Dusky  - OD with history of anterior vitrectomy and 3 piece sulcus IOL  - monitor    7. Epiretinal membrane, right eye  - very mild ERM - asymptomatic, no metamorphopsia - no indication for surgery at this time - monitor  Ophthalmic Meds Ordered this visit:  No orders of the defined types were placed in this encounter.     No follow-ups on file.  There are no Patient Instructions on file for this visit.   Explained the diagnoses, plan, and follow up with the patient and they expressed understanding.  Patient expressed understanding of the importance of proper follow up care.   This document serves as a record of services personally performed by Jeanice Millard, MD, PhD. It was created on their behalf by Olene Berne, COT an ophthalmic technician. The creation of this record is the provider's dictation and/or activities during the visit.    Electronically signed by:  Olene Berne, COT  12/08/23 2:29 PM  Jeanice Millard, M.D., Ph.D. Diseases & Surgery of the Retina and Vitreous [facility]    Abbreviations: M myopia (nearsighted); A astigmatism; H hyperopia (farsighted); P presbyopia; Mrx spectacle prescription;  CTL contact lenses; OD right eye; OS left eye; OU both eyes  XT exotropia; ET esotropia; PEK punctate epithelial keratitis; PEE punctate epithelial erosions; DES dry eye syndrome; MGD meibomian gland dysfunction; ATs artificial tears; PFAT's preservative free artificial tears; NSC nuclear sclerotic cataract; PSC posterior subcapsular cataract; ERM epi-retinal  membrane; PVD posterior vitreous detachment; RD retinal detachment; DM diabetes mellitus; DR diabetic retinopathy; NPDR non-proliferative diabetic retinopathy; PDR proliferative diabetic retinopathy; CSME clinically significant macular edema; DME diabetic macular edema; dbh dot blot hemorrhages; CWS cotton wool spot; POAG primary open angle glaucoma; C/D cup-to-disc ratio; HVF humphrey visual field; GVF goldmann visual field; OCT optical coherence tomography; IOP  intraocular pressure; BRVO Branch retinal vein occlusion; CRVO central retinal vein occlusion; CRAO central retinal artery occlusion; BRAO branch retinal artery occlusion; RT retinal tear; SB scleral buckle; PPV pars plana vitrectomy; VH Vitreous hemorrhage; PRP panretinal laser photocoagulation; IVK intravitreal kenalog ; VMT vitreomacular traction; MH Macular hole;  NVD neovascularization of the disc; NVE neovascularization elsewhere; AREDS age related eye disease study; ARMD age related macular degeneration; POAG primary open angle glaucoma; EBMD epithelial/anterior basement membrane dystrophy; ACIOL anterior chamber intraocular lens; IOL intraocular lens; PCIOL posterior chamber intraocular lens; Phaco/IOL phacoemulsification with intraocular lens placement; PRK photorefractive keratectomy; LASIK laser assisted in situ keratomileusis; HTN hypertension; DM diabetes mellitus; COPD chronic obstructive pulmonary disease

## 2023-12-08 ENCOUNTER — Encounter (INDEPENDENT_AMBULATORY_CARE_PROVIDER_SITE_OTHER): Payer: Self-pay | Admitting: Ophthalmology

## 2023-12-08 ENCOUNTER — Ambulatory Visit (INDEPENDENT_AMBULATORY_CARE_PROVIDER_SITE_OTHER): Admitting: Ophthalmology

## 2023-12-08 DIAGNOSIS — E119 Type 2 diabetes mellitus without complications: Secondary | ICD-10-CM

## 2023-12-08 DIAGNOSIS — H43813 Vitreous degeneration, bilateral: Secondary | ICD-10-CM

## 2023-12-08 DIAGNOSIS — Z961 Presence of intraocular lens: Secondary | ICD-10-CM

## 2023-12-08 DIAGNOSIS — H35351 Cystoid macular degeneration, right eye: Secondary | ICD-10-CM | POA: Diagnosis not present

## 2023-12-08 DIAGNOSIS — H33331 Multiple defects of retina without detachment, right eye: Secondary | ICD-10-CM

## 2023-12-08 DIAGNOSIS — Z7984 Long term (current) use of oral hypoglycemic drugs: Secondary | ICD-10-CM

## 2023-12-09 DIAGNOSIS — Z978 Presence of other specified devices: Secondary | ICD-10-CM | POA: Insufficient documentation

## 2023-12-10 ENCOUNTER — Encounter (INDEPENDENT_AMBULATORY_CARE_PROVIDER_SITE_OTHER): Payer: Self-pay | Admitting: Ophthalmology

## 2023-12-11 ENCOUNTER — Encounter: Payer: Self-pay | Admitting: Podiatry

## 2023-12-11 ENCOUNTER — Ambulatory Visit: Admitting: Podiatry

## 2023-12-11 DIAGNOSIS — M79674 Pain in right toe(s): Secondary | ICD-10-CM

## 2023-12-11 DIAGNOSIS — B351 Tinea unguium: Secondary | ICD-10-CM

## 2023-12-11 DIAGNOSIS — E1149 Type 2 diabetes mellitus with other diabetic neurological complication: Secondary | ICD-10-CM

## 2023-12-11 DIAGNOSIS — M79675 Pain in left toe(s): Secondary | ICD-10-CM | POA: Diagnosis not present

## 2023-12-11 DIAGNOSIS — Q828 Other specified congenital malformations of skin: Secondary | ICD-10-CM | POA: Diagnosis not present

## 2023-12-11 NOTE — Progress Notes (Signed)
 Subjective:  Patient ID: Crystal [name], female    DOB: [birth year],   MRN: [id number]  Chief Complaint  Patient presents with   Diabetes    "Look at my toes and cut the toenails."  Dr. Roxine Cordia - 12/09/2023; A1c - 7.1    73 y.o. female presents for concern of thickened elongated and painful nails that are difficult to trim. Requesting to have them trimmed today. Relates burning and tingling in their feet. Patient is diabetic and last A1c was  Lab Results  Component Value Date   HGBA1C 7.9 (A) 03/07/2019   .   PCP:  Nash Bade, MD    . Denies any other pedal complaints. Denies n/v/f/c.   Past Medical History:  Diagnosis Date   Acute peptic ulcer of stomach    As a teenager   Allergy    dust, cock roach,grass,weeds,molds   Anxiety    Arthritis    knees, hands, shoulders   Asthma    02/08/10 FEV1 1.98 (80%), s/p saba 2.22 l/m (90%).nml   Broken ankle    right foot, wearing a boot   Cognitive change 06/10/2016   Dementia (HCC)    Depression    Diabetes mellitus 2003   Type II   Diverticulosis    Dyspepsia    Fatty liver 12/19/2010   GERD (gastroesophageal reflux disease)    Hearing loss    bilateral hearing aids    Herpes simplex virus (HSV) infection    High cholesterol    Hyperlipemia    Hypertension    Hypothyroidism 09/08/2013   IBS (irritable bowel syndrome)    Insomnia    Internal hemorrhoids    Joint pain 08/08/2016   Low back pain 08/10/2016   OSA (obstructive sleep apnea)    Does not use CPAP - old one broken, new sleep study to be scheduled and then given a new CPAP machine   Plantar fasciitis    no longer an issue   Sun-damaged skin 12/09/2016   SVD (spontaneous vaginal delivery)    x 4   Vaginal Pap smear, abnormal    Vitamin D  deficiency 03/04/2016   Wears partial dentures    upper dentures and lower partial    Objective:  Physical Exam: Vascular: DP/PT pulses 2/4 bilateral. CFT <3 seconds. Absent hair growth on digits. Edema  noted to bilateral lower extremities. Xerosis noted bilaterally.  Skin. No lacerations or abrasions bilateral feet. Nails 1-5 bilateral  are thickened discolored and elongated with subungual debris. Hyperkeratotic lesion noted sub second metatarsal on the right.   Musculoskeletal: MMT 5/5 bilateral lower extremities in DF, PF, Inversion and Eversion. Deceased ROM in DF of ankle joint.  Neurological: Sensation intact to light touch. Protective sensation diminished bilateral.   Assessment:   1. Pain due to onychomycosis of toenails of both feet   2. Porokeratosis   3. Type II diabetes mellitus with neurological manifestations (HCC)      Plan:  Patient was evaluated and treated and all questions answered. -Discussed and educated patient on diabetic foot care, especially with  regards to the vascular, neurological and musculoskeletal systems.  -Stressed the importance of good glycemic control and the detriment of not  controlling glucose levels in relation to the foot. -Discussed supportive shoes at all times and checking feet regularly.  -Mechanically debrided all nails 1-5 bilateral using sterile nail nipper and filed with dremel without incident  -hyperkeratotic lesion x 1 debrided without incident.  Sub second metatarsal on right.  -  Answered all patient questions -Patient to return  in  11 weeks  for at risk foot care -Patient advised to call the office if any problems or questions arise in the meantime.   Jennefer Moats, DPM

## 2023-12-14 ENCOUNTER — Other Ambulatory Visit (INDEPENDENT_AMBULATORY_CARE_PROVIDER_SITE_OTHER): Payer: Self-pay

## 2023-12-14 MED ORDER — BROMFENAC SODIUM 0.07 % OP SOLN: 1.0000 [drp] | Freq: Two times a day (BID) | OPHTHALMIC | 6 refills | Status: DC

## 2023-12-14 MED ORDER — PREDNISOLONE ACETATE 1 % OP SUSP: 1.0000 [drp] | Freq: Two times a day (BID) | OPHTHALMIC | 6 refills | Status: DC

## 2023-12-30 NOTE — Progress Notes (Signed)
 [facility] - Clinic Note  01/06/2024     CHIEF COMPLAINT Patient presents for Retina Follow Up    HISTORY OF PRESENT ILLNESS: Crystal [name] is a 73 y.o. female who presents to the clinic today for:   HPI     Retina Follow Up   In both eyes.  This started 4 weeks ago.  Duration of 4 weeks.  Since onset it is stable.  I, the attending physician,  performed the HPI with the patient and updated documentation appropriately.        Comments   4 week retina follow up CME OD pt is reporting vision seems about the same she denies any flashes or floaters she is using PF and Prolensa  to BID OD      Last edited by Valdemar Rogue, MD on 01/10/2024 11:49 AM.    Pt states she is still using PF and Prolensa  BID    Referring physician: Leonce Sink, MD [address] [place],  [place] [address]  HISTORICAL INFORMATION:   Selected notes from the MEDICAL RECORD NUMBER Referral from Dr. Camillo for retina eval: - last exam by [facility], 03/17/17 - 2 wk history of floaters OU - hx of DM2 since 2003 - pseudophakia OU (complex CEIOL OD, 12.14.16, OS: 11.28.16)   CURRENT MEDICATIONS: Current Outpatient Medications (Ophthalmic Drugs)  Medication Sig   Bromfenac  Sodium 0.07 % SOLN Place 1 drop into the right eye in the morning and at bedtime.   ketorolac  (ACULAR ) 0.5 % ophthalmic solution Place 1 drop into the right eye 3 (three) times daily.   prednisoLONE  acetate (PRED FORTE ) 1 % ophthalmic suspension Place 1 drop into the right eye in the morning and at bedtime.   No current facility-administered medications for this visit. (Ophthalmic Drugs)   Current Outpatient Medications (Other)  Medication Sig   albuterol  (VENTOLIN  HFA) 108 (90 Base) MCG/ACT inhaler Inhale into the lungs.   Alcohol Swabs (B-D SINGLE USE SWABS REGULAR) PADS Use as directed to check blood sugar. DX E11.9   atenolol  (TENORMIN ) 25 MG tablet Take by mouth.   atorvastatin   (LIPITOR) 80 MG tablet Take 1 tablet by mouth once daily   BINAXNOW COVID-19 AG HOME TEST KIT Use as Directed on the Package   Blood Glucose Monitoring Suppl (ONETOUCH VERIO) w/Device KIT by Does not apply route.   budesonide -formoterol  (SYMBICORT ) 80-4.5 MCG/ACT inhaler Inhale 2 puffs into the lungs 2 (two) times a day.   buPROPion (WELLBUTRIN XL) 150 MG 24 hr tablet Take 1 tablet by mouth daily.   cephALEXin  (KEFLEX ) 500 MG capsule Take 1 capsule (500 mg total) by mouth 2 (two) times daily.   cetirizine  (ZYRTEC ) 10 MG tablet Take 1 tablet (10 mg total) by mouth daily as needed for allergies.   Cholecalciferol (VITAMIN D3) 2000 units TABS Take 1 tablet by mouth daily.   ciclopirox  (PENLAC ) 8 % solution Apply topically at bedtime. Apply over nail and surrounding skin. Apply daily over previous coat. After seven (7) days, may remove with alcohol and continue cycle.   clotrimazole  (LOTRIMIN ) 1 % cream Apply to affected area 2 times daily for up to 4 weeks   dextromethorphan-guaiFENesin (MUCINEX DM) 30-600 MG 12hr tablet Take 1 to 2 tablets every 12 hours for cough and chest congestion.   diclofenac  (VOLTAREN ) 75 MG EC tablet Take 1 tablet (75 mg total) by mouth 2 (two) times daily.   dicyclomine  (BENTYL ) 10 MG capsule TAKE 1 CAPSULE BY MOUTH  EVERY 4 TO 6 HOURS AS NEEDED FOR ABDOMINAL PAIN AND CRAMPING OR DIARRHEA   diphenoxylate -atropine  (LOMOTIL ) 2.5-0.025 MG tablet Take 1 tablet by mouth every morning. If continued diarrhea in the next 2-3 days may increase to twice daily.   donepezil  (ARICEPT ) 10 MG tablet Take 1 tablet (10 mg total) by mouth at bedtime.   escitalopram  (LEXAPRO ) 20 MG tablet Take 1 tablet (20 mg total) by mouth daily.   fenofibrate  160 MG tablet TAKE 1 TABLET (160 MG TOTAL) DAILY.   fenofibrate  160 MG tablet Take 1 tablet by mouth daily.   fenofibrate  160 MG tablet Take by mouth.   fluticasone  (FLOVENT  HFA) 110 MCG/ACT inhaler Inhale 1-2 puffs into the lungs 2 (two) times daily.    gabapentin  (NEURONTIN ) 100 MG capsule Take 1 capsule (100 mg total) by mouth 3 (three) times daily.   gabapentin  (NEURONTIN ) 100 MG capsule Take by mouth.   glimepiride  (AMARYL ) 1 MG tablet Take by mouth.   glucose blood (ONETOUCH VERIO) test strip Use as instructed to check blood sugar 2 times a day   hydrochlorothiazide  (HYDRODIURIL ) 25 MG tablet Take 1 tablet (25 mg total) by mouth daily.   ipratropium (ATROVENT) 0.06 % nasal spray SMARTSIG:2 Spray(s) Both Nares Morning-Night   Lancets Ultra Fine MISC Use to check blood sugar two times a day   levothyroxine  (SYNTHROID ) 25 MCG tablet TAKE 1 TABLET BY MOUTH ONCE DAILY BEFORE  BREAKFAST   levothyroxine  (SYNTHROID ) 25 MCG tablet Take by mouth.   losartan  (COZAAR ) 50 MG tablet Take 1 tablet by mouth once daily   losartan  (COZAAR ) 50 MG tablet Take 1 tablet by mouth daily.   losartan  (COZAAR ) 50 MG tablet Take by mouth.   montelukast  (SINGULAIR ) 10 MG tablet TAKE 1 TABLET BY MOUTH AT BEDTIME   montelukast  (SINGULAIR ) 10 MG tablet Take by mouth.   Multiple Vitamins-Minerals (CENTRUM SILVER PO) Take 1 tablet by mouth daily.     nitrofurantoin, macrocrystal-monohydrate, (MACROBID) 100 MG capsule Take 100 mg by mouth 2 (two) times daily.   oxyCODONE  (OXY IR/ROXICODONE ) 5 MG immediate release tablet Take 5 mg by mouth 4 (four) times daily as needed.   pantoprazole  (PROTONIX ) 40 MG tablet Take 1 tablet by mouth once daily   pantoprazole  (PROTONIX ) 40 MG tablet Take 1 tablet by mouth daily.   Probiotic Product (ALIGN) 4 MG CAPS Take 1 capsule by mouth daily. Reported on 07/02/2015   sitaGLIPtin  (JANUVIA ) 100 MG tablet Take 1 tablet (100 mg total) by mouth daily.   triamcinolone  (KENALOG ) 0.025 % ointment Apply 1 application topically daily.   valACYclovir  (VALTREX ) 1000 MG tablet Take 1 tablet (1,000 mg total) by mouth 3 (three) times daily.   No current facility-administered medications for this visit. (Other)   REVIEW OF SYSTEMS: ROS    Positive for: Gastrointestinal, Musculoskeletal, Endocrine, Eyes, Respiratory, Psychiatric Negative for: Constitutional, Neurological, Skin, Genitourinary, HENT, Cardiovascular, Allergic/Imm, Heme/Lymph Last edited by Resa Delon ORN, COT on 01/06/2024  2:06 PM.       ALLERGIES Allergies  Allergen Reactions   Compazine [Prochlorperazine Edisylate]     Comma for 3 days   Sulfa  Antibiotics Itching    severe itching   Tetanus Toxoid Other (See Comments)    convulsions   Gabapentin  (Once-Daily)    Metformin      Other reaction(s): GI Upset (intolerance) Extreme diarrhea   PAST MEDICAL HISTORY Past Medical History:  Diagnosis Date   Acute peptic ulcer of stomach    As a teenager  Allergy    dust, cock roach,grass,weeds,molds   Anxiety    Arthritis    knees, hands, shoulders   Asthma    02/08/10 FEV1 1.98 (80%), s/p saba 2.22 l/m (90%).nml   Broken ankle    right foot, wearing a boot   Cognitive change 06/10/2016   Dementia (HCC)    Depression    Diabetes mellitus 2003   Type II   Diverticulosis    Dyspepsia    Fatty liver 12/19/2010   GERD (gastroesophageal reflux disease)    Hearing loss    bilateral hearing aids    Herpes simplex virus (HSV) infection    High cholesterol    Hyperlipemia    Hypertension    Hypothyroidism 09/08/2013   IBS (irritable bowel syndrome)    Insomnia    Internal hemorrhoids    Joint pain 08/08/2016   Low back pain 08/10/2016   OSA (obstructive sleep apnea)    Does not use CPAP - old one broken, new sleep study to be scheduled and then given a new CPAP machine   Plantar fasciitis    no longer an issue   Sun-damaged skin 12/09/2016   SVD (spontaneous vaginal delivery)    x 4   Vaginal Pap smear, abnormal    Vitamin D  deficiency 03/04/2016   Wears partial dentures    upper dentures and lower partial   Past Surgical History:  Procedure Laterality Date   APPENDECTOMY     age 2   CATARACT EXTRACTION Bilateral 2016   Dr. Camillo    CATARACT EXTRACTION, BILATERAL     12/8 and 12/14   COLONOSCOPY  06/03/2010, 08/25/2011    severeal - polyps and diverticulosis    DILATATION & CURETTAGE/HYSTEROSCOPY WITH MYOSURE N/A 04/01/2018   Procedure: DILATATION & CURETTAGE/HYSTEROSCOPY WITH MYOSURE POLYPECTOMY;  Surgeon: Starla Harland BROCKS, MD;  Location: [facility];  Service: Gynecology;  Laterality: N/A;   EYE SURGERY     cateracts removed bilateral   GASTRECTOMY     partial - ulcer as a teen   HAND SURGERY Right    thumb    HEMORRHOID BANDING     KNEE ARTHROSCOPY Left 1998   NASAL SEPTUM SURGERY     RHINOPLASTY     TOE SURGERY Right 1995   FAMILY HISTORY Family History  Problem Relation Age of Onset   Diabetes Mother        type II   Heart attack Mother 42   Stroke Mother    Hyperlipidemia Mother    Colon cancer Sister    Cancer Sister        GI   Heart disease Sister    Asthma Maternal Grandmother    Mental illness Son        PTSD from military   Parkinsonism Father    Cancer Sister        stage 4 bladder cancer   Colon polyps Sister    Ulcers Other        bleeding gastric   Heart defect Other        child   Other Other        perforated bowel, child   Cancer Brother        pancreatic cancer   Amblyopia Neg Hx    Blindness Neg Hx    Cataracts Neg Hx    Glaucoma Neg Hx    Macular degeneration Neg Hx    Retinal detachment Neg Hx    Strabismus Neg Hx  Retinitis pigmentosa Neg Hx    SOCIAL HISTORY Social History   Tobacco Use   Smoking status: Never   Smokeless tobacco: Never  Vaping Use   Vaping status: Never Used  Substance Use Topics   Alcohol use: Not Currently    Comment: Rare occasions    Drug use: No       OPHTHALMIC EXAM: Base Eye Exam     Visual Acuity (Snellen - Linear)       Right Left   Dist cc 20/40 20/20 -2   Dist ph cc 20/25          Tonometry (Tonopen, 2:11 PM)       Right Left   Pressure 13 15         Pupils       Pupils Dark Light Shape React APD   Right  PERRL 3 1 Round Brisk None   Left PERRL 3 1 Round Brisk None         Visual Fields       Left Right    Full Full         Extraocular Movement       Right Left    Full, Ortho Full, Ortho         Neuro/Psych     Oriented x3: Yes   Mood/Affect: Normal         Dilation     Both eyes: 2.5% Phenylephrine @ 2:11 PM           Slit Lamp and Fundus Exam     External Exam       Right Left   External Normal Normal         Slit Lamp Exam       Right Left   Lids/Lashes Dermatochalasis - upper lid Dermatochalasis - upper lid temporally   Conjunctiva/Sclera White and quiet White and quiet   Cornea well healed temporal cataract wound Well healed cataract wounds, trace tear film debris   Anterior Chamber Vit strands to temporal cataract wound, deep and clear, no cell or flare deep and clear   Iris Slightly irregular -- mild peaking temporally, TID at temporal pupil margin Round and dilated; PPM at 0600   Lens 3 piece PC IOL in good position with open PC, mild vitreous prolapse around temporal lens edge PC IOL in good postition   Anterior Vitreous mild syneresis, mild vitreous prolapse around temporal lens edge Mild syneresis; Posterior vitreous detachment         Fundus Exam       Right Left   Disc Pink and sharp, temporal PPP, +cupping +cupping, mild tilt, PPP, Pink and Sharp   C/D Ratio 0.7 0.7   Macula Flat, Blunted foveal reflex, central cystic changes -- stably improved, mild Epiretinal membrane, mild RPE mottling, no frank heme Flat, blunted foveal reflex, Retinal pigment epithelial mottling, No heme or edema   Vessels attenuated, mild tortuosity attenuated, mild tortuosity   Periphery flat; attached; trace RPE changes; hair line Retinal tears at 3 and 4 o'clock w/ good laser scars surrounding -- basically ora; peripheral cystoid degeneration; ST cobblestoning, No new RT/RD, No heme attached; trace RPE changes, peripheral cystoid degneration, rare MA            Refraction     Wearing Rx       Sphere Cylinder Axis Add   Right -1.25 +0.75 088 +2.50   Left -1.25 +0.75 110 +2.50    Type: PAL  IMAGING AND PROCEDURES  Imaging and Procedures for 04/30/17  OCT, Retina - OU - Both Eyes       Right Eye Quality was good. Central Foveal Thickness: 311. Progression has been stable. Findings include normal foveal contour, no IRF, no SRF, epiretinal membrane (stable improvement in IRF / CME, +ERM).   Left Eye Quality was good. Central Foveal Thickness: 276. Progression has been stable. Findings include normal foveal contour, no IRF, no SRF.   Notes Images taken, stored on drive   Diagnosis / Impression:  OD - stable improvement in IRF / CME, +ERM OS - NFP; no IRF/SRF  Clinical management:  See below  Abbreviations: NFP - Normal foveal profile. CME - cystoid macular edema. PED - pigment epithelial detachment. IRF - intraretinal fluid. SRF - subretinal fluid. EZ - ellipsoid zone. ERM - epiretinal membrane. ORA - outer retinal atrophy. ORT - outer retinal tubulation. SRHM - subretinal hyper-reflective material               ASSESSMENT/PLAN:    ICD-10-CM   1. CME (cystoid macular edema), right  H35.351 OCT, Retina - OU - Both Eyes    2. 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 Posterior vitreous detachment of both eyes  H43.813     4. Type 2 diabetes mellitus without complication, without long-term current use of insulin (HCC)  E11.9     5. Long term (current) use of oral hypoglycemic drugs  Z79.84     6. Pseudophakia of both eyes  Z96.1     7. Epiretinal membrane (ERM) of right eye  H35.371      1. CME OD - referred back by Dr. Delories for mild recurrent CME OD -- October 2024  - pt's last visit here prior to that was on 10.28.22 - FA OD (10.30.24) shows Perifoveal petaloid hyperfluorescent leakage and hyperfluorescent staining of disc -- consistent with CME - restarted on  PF and Prolensa  on 10.30.24 - today, BCVA 20/25 from 20/40  - OCT shows stable improvement in IRF/CME on PF and Prolensa  BID OD - recurrent CME likely worsened by vitreous prolapse to temporal cataract wound OD -- may benefit from YAG vitreolysis if CME continues to recur - also discussed possible need for anti-inflammatory tx long-term - decrease PF and Prolensa  to QDaily OD until bottles run out  - f/u 6-8 weeks, DFE, OCT  2. Peripheral retinal tears OD  - Small flap tears at ora, 0300 and 0400 oclock  - s/p barrier laser retinopexy OD, 03/18/17 -- good laser in place  - stable; No new RT/RD  - monitor  3. PVD / vitreous syneresis OU  - Discussed findings and prognosis  - No RT or RD on 360 scleral depressed exam OS  - Reviewed s/s of RT/RD  - Strict return precautions for any such RT/RD signs/symptoms   4,5. DM2 without retinopathy  - last A1C 6.9 (02.25.25), 7.0 (11.25.24)  - On oral meds  - Discussed importance of tight BS and BP control  6. Pseudophakia OU  - IOLs in good position OU -- beautiful surgeries by Dr. Camillo  - OD with history of anterior vitrectomy and 3 piece sulcus IOL  - monitor   7. Epiretinal membrane, right eye  - very mild ERM - asymptomatic, no metamorphopsia - no indication for surgery at this time - monitor  Ophthalmic Meds Ordered this visit:  No orders of the defined types were placed in this encounter.  Return for f/u 6-8 weeks, CME OD, DFE, OCT.  There are no Patient Instructions on file for this visit.   Explained the diagnoses, plan, and follow up with the patient and they expressed understanding.  Patient expressed understanding of the importance of proper follow up care.   This document serves as a record of services personally performed by Redell JUDITHANN Hans, MD, PhD. It was created on their behalf by Almetta Pesa, an ophthalmic technician. The creation of this record is the provider's dictation and/or activities during the visit.     Electronically signed by: Almetta Pesa, OA, 01/10/24  11:51 AM  This document serves as a record of services personally performed by Redell JUDITHANN Hans, MD, PhD. It was created on their behalf by Alan PARAS. Delores, OA an ophthalmic technician. The creation of this record is the provider's dictation and/or activities during the visit.    Electronically signed by: Alan PARAS. Delores, OA 01/10/24 11:51 AM  Redell JUDITHANN Hans, M.D., Ph.D. Diseases & Surgery of the Retina and Vitreous [facility]  I have reviewed the above documentation for accuracy and completeness, and I agree with the above. Redell JUDITHANN Hans, M.D., Ph.D. 01/10/24 11:52 AM   Abbreviations: M myopia (nearsighted); A astigmatism; H hyperopia (farsighted); P presbyopia; Mrx spectacle prescription;  CTL contact lenses; OD right eye; OS left eye; OU both eyes  XT exotropia; ET esotropia; PEK punctate epithelial keratitis; PEE punctate epithelial erosions; DES dry eye syndrome; MGD meibomian gland dysfunction; ATs artificial tears; PFAT's preservative free artificial tears; NSC nuclear sclerotic cataract; PSC posterior subcapsular cataract; ERM epi-retinal membrane; PVD posterior vitreous detachment; RD retinal detachment; DM diabetes mellitus; DR diabetic retinopathy; NPDR non-proliferative diabetic retinopathy; PDR proliferative diabetic retinopathy; CSME clinically significant macular edema; DME diabetic macular edema; dbh dot blot hemorrhages; CWS cotton wool spot; POAG primary open angle glaucoma; C/D cup-to-disc ratio; HVF humphrey visual field; GVF goldmann visual field; OCT optical coherence tomography; IOP intraocular pressure; BRVO Branch retinal vein occlusion; CRVO central retinal vein occlusion; CRAO central retinal artery occlusion; BRAO branch retinal artery occlusion; RT retinal tear; SB scleral buckle; PPV pars plana vitrectomy; VH Vitreous hemorrhage; PRP panretinal laser photocoagulation; IVK intravitreal  kenalog ; VMT vitreomacular traction; MH Macular hole;  NVD neovascularization of the disc; NVE neovascularization elsewhere; AREDS age related eye disease study; ARMD age related macular degeneration; POAG primary open angle glaucoma; EBMD epithelial/anterior basement membrane dystrophy; ACIOL anterior chamber intraocular lens; IOL intraocular lens; PCIOL posterior chamber intraocular lens; Phaco/IOL phacoemulsification with intraocular lens placement; PRK photorefractive keratectomy; LASIK laser assisted in situ keratomileusis; HTN hypertension; DM diabetes mellitus; COPD chronic obstructive pulmonary disease

## 2023-12-31 DIAGNOSIS — M810 Age-related osteoporosis without current pathological fracture: Secondary | ICD-10-CM | POA: Insufficient documentation

## 2024-01-06 ENCOUNTER — Ambulatory Visit (INDEPENDENT_AMBULATORY_CARE_PROVIDER_SITE_OTHER): Admitting: Ophthalmology

## 2024-01-06 ENCOUNTER — Encounter (INDEPENDENT_AMBULATORY_CARE_PROVIDER_SITE_OTHER): Payer: Self-pay | Admitting: Ophthalmology

## 2024-01-06 DIAGNOSIS — Z7984 Long term (current) use of oral hypoglycemic drugs: Secondary | ICD-10-CM

## 2024-01-06 DIAGNOSIS — E119 Type 2 diabetes mellitus without complications: Secondary | ICD-10-CM

## 2024-01-06 DIAGNOSIS — H33331 Multiple defects of retina without detachment, right eye: Secondary | ICD-10-CM

## 2024-01-06 DIAGNOSIS — H35371 Puckering of macula, right eye: Secondary | ICD-10-CM | POA: Diagnosis not present

## 2024-01-06 DIAGNOSIS — H35351 Cystoid macular degeneration, right eye: Secondary | ICD-10-CM

## 2024-01-06 DIAGNOSIS — H43813 Vitreous degeneration, bilateral: Secondary | ICD-10-CM

## 2024-01-06 DIAGNOSIS — Z961 Presence of intraocular lens: Secondary | ICD-10-CM

## 2024-01-10 ENCOUNTER — Encounter (INDEPENDENT_AMBULATORY_CARE_PROVIDER_SITE_OTHER): Payer: Self-pay | Admitting: Ophthalmology

## 2024-02-25 NOTE — Progress Notes (Signed)
 [facility] - Clinic Note  03/02/2024     CHIEF COMPLAINT Patient presents for Retina Follow Up    HISTORY OF PRESENT ILLNESS: Crystal [name] is a 73 y.o. female who presents to the clinic today for:   HPI     Retina Follow Up   In both eyes.  This started 10 months ago.  Duration of 2 months.  I, the attending physician,  performed the HPI with the patient and updated documentation appropriately.        Comments   Pt denies changes in vision/FOL/floaters/pain. Pt states she used up all of the PF and Prolensa  drops and is hoping everything looks good.      Last edited by Valdemar Rogue, MD on 03/08/2024 10:53 PM.    Pt states she stopped gtts OD last week.   Referring physician: Leonce Sink, MD [address] [place],  [place] [address]  HISTORICAL INFORMATION:   Selected notes from the MEDICAL RECORD NUMBER Referral from Dr. Camillo for retina eval: - last exam by [facility], 03/17/17 - 2 wk history of floaters OU - hx of DM2 since 2003 - pseudophakia OU (complex CEIOL OD, 12.14.16, OS: 11.28.16)   CURRENT MEDICATIONS: Current Outpatient Medications (Ophthalmic Drugs)  Medication Sig   Bromfenac  Sodium 0.07 % SOLN Place 1 drop into the right eye daily.   ketorolac  (ACULAR ) 0.5 % ophthalmic solution Place 1 drop into the right eye 3 (three) times daily.   prednisoLONE  acetate (PRED FORTE ) 1 % ophthalmic suspension Place 1 drop into the right eye daily.   No current facility-administered medications for this visit. (Ophthalmic Drugs)   Current Outpatient Medications (Other)  Medication Sig   albuterol  (VENTOLIN  HFA) 108 (90 Base) MCG/ACT inhaler Inhale into the lungs.   Alcohol Swabs (B-D SINGLE USE SWABS REGULAR) PADS Use as directed to check blood sugar. DX E11.9   atenolol  (TENORMIN ) 25 MG tablet Take by mouth.   atorvastatin  (LIPITOR) 80 MG tablet Take 1 tablet by mouth once daily   BINAXNOW COVID-19 AG HOME TEST  KIT Use as Directed on the Package   Blood Glucose Monitoring Suppl (ONETOUCH VERIO) w/Device KIT by Does not apply route.   budesonide -formoterol  (SYMBICORT ) 80-4.5 MCG/ACT inhaler Inhale 2 puffs into the lungs 2 (two) times a day.   buPROPion (WELLBUTRIN XL) 150 MG 24 hr tablet Take 1 tablet by mouth daily.   cephALEXin  (KEFLEX ) 500 MG capsule Take 1 capsule (500 mg total) by mouth 2 (two) times daily.   cetirizine  (ZYRTEC ) 10 MG tablet Take 1 tablet (10 mg total) by mouth daily as needed for allergies.   Cholecalciferol (VITAMIN D3) 2000 units TABS Take 1 tablet by mouth daily.   ciclopirox  (PENLAC ) 8 % solution Apply topically at bedtime. Apply over nail and surrounding skin. Apply daily over previous coat. After seven (7) days, may remove with alcohol and continue cycle.   cloNIDine (CATAPRES) 0.1 MG tablet Take 0.1 mg by mouth 2 (two) times daily.   clotrimazole  (LOTRIMIN ) 1 % cream Apply to affected area 2 times daily for up to 4 weeks   dextromethorphan-guaiFENesin (MUCINEX DM) 30-600 MG 12hr tablet Take 1 to 2 tablets every 12 hours for cough and chest congestion.   diclofenac  (VOLTAREN ) 75 MG EC tablet Take 1 tablet (75 mg total) by mouth 2 (two) times daily.   dicyclomine  (BENTYL ) 10 MG capsule TAKE 1 CAPSULE BY MOUTH EVERY 4 TO 6 HOURS AS NEEDED FOR ABDOMINAL PAIN  AND CRAMPING OR DIARRHEA   diphenoxylate -atropine  (LOMOTIL ) 2.5-0.025 MG tablet Take 1 tablet by mouth every morning. If continued diarrhea in the next 2-3 days may increase to twice daily.   donepezil  (ARICEPT ) 10 MG tablet Take 1 tablet (10 mg total) by mouth at bedtime.   escitalopram  (LEXAPRO ) 20 MG tablet Take 1 tablet (20 mg total) by mouth daily.   fenofibrate  160 MG tablet TAKE 1 TABLET (160 MG TOTAL) DAILY.   fenofibrate  160 MG tablet Take 1 tablet by mouth daily.   fenofibrate  160 MG tablet Take by mouth.   fluticasone  (FLOVENT  HFA) 110 MCG/ACT inhaler Inhale 1-2 puffs into the lungs 2 (two) times daily.    gabapentin  (NEURONTIN ) 100 MG capsule Take 1 capsule (100 mg total) by mouth 3 (three) times daily.   gabapentin  (NEURONTIN ) 100 MG capsule Take by mouth.   glimepiride  (AMARYL ) 1 MG tablet Take by mouth.   glucose blood (ONETOUCH VERIO) test strip Use as instructed to check blood sugar 2 times a day   hydrochlorothiazide  (HYDRODIURIL ) 25 MG tablet Take 1 tablet (25 mg total) by mouth daily.   ipratropium (ATROVENT) 0.06 % nasal spray SMARTSIG:2 Spray(s) Both Nares Morning-Night   JARDIANCE  10 MG TABS tablet Take 10 mg by mouth daily.   Lancets Ultra Fine MISC Use to check blood sugar two times a day   LANTUS SOLOSTAR 100 UNIT/ML Solostar Pen Inject into the skin.   levothyroxine  (SYNTHROID ) 25 MCG tablet TAKE 1 TABLET BY MOUTH ONCE DAILY BEFORE  BREAKFAST   levothyroxine  (SYNTHROID ) 25 MCG tablet Take by mouth.   losartan  (COZAAR ) 50 MG tablet Take 1 tablet by mouth once daily   losartan  (COZAAR ) 50 MG tablet Take 1 tablet by mouth daily.   losartan  (COZAAR ) 50 MG tablet Take by mouth.   montelukast  (SINGULAIR ) 10 MG tablet TAKE 1 TABLET BY MOUTH AT BEDTIME   montelukast  (SINGULAIR ) 10 MG tablet Take by mouth.   Multiple Vitamins-Minerals (CENTRUM SILVER PO) Take 1 tablet by mouth daily.     nitrofurantoin, macrocrystal-monohydrate, (MACROBID) 100 MG capsule Take 100 mg by mouth 2 (two) times daily.   oxyCODONE  (OXY IR/ROXICODONE ) 5 MG immediate release tablet Take 5 mg by mouth 4 (four) times daily as needed.   pantoprazole  (PROTONIX ) 40 MG tablet Take 1 tablet by mouth once daily   pantoprazole  (PROTONIX ) 40 MG tablet Take 1 tablet by mouth daily.   prazosin (MINIPRESS) 1 MG capsule Take 1 mg by mouth.   Probiotic Product (ALIGN) 4 MG CAPS Take 1 capsule by mouth daily. Reported on 07/02/2015   sertraline  (ZOLOFT ) 100 MG tablet Take by mouth.   sitaGLIPtin  (JANUVIA ) 100 MG tablet Take 1 tablet (100 mg total) by mouth daily.   traZODone (DESYREL) 50 MG tablet Take 50 mg by mouth at  bedtime.   triamcinolone  (KENALOG ) 0.025 % ointment Apply 1 application topically daily.   valACYclovir  (VALTREX ) 1000 MG tablet Take 1 tablet (1,000 mg total) by mouth 3 (three) times daily.   No current facility-administered medications for this visit. (Other)   REVIEW OF SYSTEMS: ROS   Positive for: Gastrointestinal, Musculoskeletal, Endocrine, Eyes, Respiratory, Psychiatric Negative for: Constitutional, Neurological, Skin, Genitourinary, HENT, Cardiovascular, Allergic/Imm, Heme/Lymph Last edited by Elnor Avelina RAMAN, COT on 03/02/2024  1:52 PM.        ALLERGIES Allergies  Allergen Reactions   Compazine [Prochlorperazine Edisylate]     Comma for 3 days   Sulfa  Antibiotics Itching    severe itching   Tetanus Toxoid  Other (See Comments)    convulsions   Gabapentin  (Once-Daily)    Metformin      Other reaction(s): GI Upset (intolerance) Extreme diarrhea   PAST MEDICAL HISTORY Past Medical History:  Diagnosis Date   Acute peptic ulcer of stomach    As a teenager   Allergy    dust, cock roach,grass,weeds,molds   Anxiety    Arthritis    knees, hands, shoulders   Asthma    02/08/10 FEV1 1.98 (80%), s/p saba 2.22 l/m (90%).nml   Broken ankle    right foot, wearing a boot   Cognitive change 06/10/2016   Dementia (HCC)    Depression    Diabetes mellitus 2003   Type II   Diverticulosis    Dyspepsia    Fatty liver 12/19/2010   GERD (gastroesophageal reflux disease)    Hearing loss    bilateral hearing aids    Herpes simplex virus (HSV) infection    High cholesterol    Hyperlipemia    Hypertension    Hypothyroidism 09/08/2013   IBS (irritable bowel syndrome)    Insomnia    Internal hemorrhoids    Joint pain 08/08/2016   Low back pain 08/10/2016   OSA (obstructive sleep apnea)    Does not use CPAP - old one broken, new sleep study to be scheduled and then given a new CPAP machine   Plantar fasciitis    no longer an issue   Sun-damaged skin 12/09/2016   SVD  (spontaneous vaginal delivery)    x 4   Vaginal Pap smear, abnormal    Vitamin D  deficiency 03/04/2016   Wears partial dentures    upper dentures and lower partial   Past Surgical History:  Procedure Laterality Date   APPENDECTOMY     age 3   CATARACT EXTRACTION Bilateral 2016   Dr. Camillo   CATARACT EXTRACTION, BILATERAL     12/8 and 12/14   COLONOSCOPY  06/03/2010, 08/25/2011    severeal - polyps and diverticulosis    DILATATION & CURETTAGE/HYSTEROSCOPY WITH MYOSURE N/A 04/01/2018   Procedure: DILATATION & CURETTAGE/HYSTEROSCOPY WITH MYOSURE POLYPECTOMY;  Surgeon: Starla Harland BROCKS, MD;  Location: [facility];  Service: Gynecology;  Laterality: N/A;   EYE SURGERY     cateracts removed bilateral   GASTRECTOMY     partial - ulcer as a teen   HAND SURGERY Right    thumb    HEMORRHOID BANDING     KNEE ARTHROSCOPY Left 1998   NASAL SEPTUM SURGERY     RHINOPLASTY     TOE SURGERY Right 1995   FAMILY HISTORY Family History  Problem Relation Age of Onset   Diabetes Mother        type II   Heart attack Mother 66   Stroke Mother    Hyperlipidemia Mother    Colon cancer Sister    Cancer Sister        GI   Heart disease Sister    Asthma Maternal Grandmother    Mental illness Son        PTSD from military   Parkinsonism Father    Cancer Sister        stage 4 bladder cancer   Colon polyps Sister    Ulcers Other        bleeding gastric   Heart defect Other        child   Other Other        perforated bowel, child   Cancer Brother  pancreatic cancer   Amblyopia Neg Hx    Blindness Neg Hx    Cataracts Neg Hx    Glaucoma Neg Hx    Macular degeneration Neg Hx    Retinal detachment Neg Hx    Strabismus Neg Hx    Retinitis pigmentosa Neg Hx    SOCIAL HISTORY Social History   Tobacco Use   Smoking status: Never   Smokeless tobacco: Never  Vaping Use   Vaping status: Never Used  Substance Use Topics   Alcohol use: Not Currently    Comment: Rare occasions    Drug use:  No       OPHTHALMIC EXAM: Base Eye Exam     Visual Acuity (Snellen - Linear)       Right Left   Dist cc 20/40 +1 20/20 -1   Dist ph cc 20/30     Correction: Glasses         Tonometry (Tonopen, 1:47 PM)       Right Left   Pressure 20 18         Pupils       Pupils Dark Light Shape React APD   Right PERRL 3 2 Round Brisk None   Left PERRL 3 2 Round Brisk None         Visual Fields       Left Right    Full Full         Extraocular Movement       Right Left    Full, Ortho Full, Ortho         Neuro/Psych     Oriented x3: Yes   Mood/Affect: Normal         Dilation     Both eyes: 1.0% Mydriacyl, 2.5% Phenylephrine @ 1:48 PM           Slit Lamp and Fundus Exam     External Exam       Right Left   External Normal Normal         Slit Lamp Exam       Right Left   Lids/Lashes Dermatochalasis - upper lid Dermatochalasis - upper lid temporally   Conjunctiva/Sclera White and quiet White and quiet   Cornea well healed temporal cataract wound, tear film debris, mild Guttata Well healed cataract wounds, trace tear film debris   Anterior Chamber Vit strands to temporal cataract wound, deep and clear, no cell or flare deep and clear   Iris Slightly irregular -- mild peaking temporally, TID at temporal pupil margin Round and dilated; PPM at 0600   Lens 3 piece PC IOL in good position with open PC, mild vitreous prolapse around temporal lens edge PC IOL in good postition   Anterior Vitreous mild syneresis, mild vitreous prolapse around temporal lens edge Mild syneresis; Posterior vitreous detachment         Fundus Exam       Right Left   Disc Pink and sharp, temporal PPP, +cupping +cupping, mild tilt, PPP, Pink and Sharp   C/D Ratio 0.7 0.7   Macula Flat, Blunted foveal reflex, trace cystic changes, mild Epiretinal membrane, mild RPE mottling, no frank heme Flat, blunted foveal reflex, Retinal pigment epithelial mottling, No heme or edema    Vessels attenuated, mild tortuosity attenuated, mild tortuosity   Periphery flat; attached; trace RPE changes; hair line Retinal tears at 3 and 4 o'clock w/ good laser scars surrounding -- basically ora; peripheral cystoid degeneration; ST cobblestoning, No new RT/RD, No  heme attached; trace RPE changes, peripheral cystoid degneration, rare MA           Refraction     Wearing Rx       Sphere Cylinder Axis Add   Right -1.25 +0.75 088 +2.50   Left -1.25 +0.75 110 +2.50    Type: PAL            IMAGING AND PROCEDURES  Imaging and Procedures for 04/30/17  OCT, Retina - OU - Both Eyes       Right Eye Quality was good. Central Foveal Thickness: 340. Progression has worsened. Findings include normal foveal contour, no IRF, no SRF, epiretinal membrane (Interval redevelopment of cystic changes/ CME, +ERM).   Left Eye Quality was good. Central Foveal Thickness: 276. Progression has been stable. Findings include normal foveal contour, no IRF, no SRF.   Notes Images taken, stored on drive   Diagnosis / Impression:  OD - Interval redevelopment of cystic changes/ CME, +ERM OS - NFP; no IRF/SRF  Clinical management:  See below  Abbreviations: NFP - Normal foveal profile. CME - cystoid macular edema. PED - pigment epithelial detachment. IRF - intraretinal fluid. SRF - subretinal fluid. EZ - ellipsoid zone. ERM - epiretinal membrane. ORA - outer retinal atrophy. ORT - outer retinal tubulation. SRHM - subretinal hyper-reflective material             ASSESSMENT/PLAN:    ICD-10-CM   1. CME (cystoid macular edema), right  H35.351 OCT, Retina - OU - Both Eyes    2. 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 Posterior vitreous detachment of both eyes  H43.813     4. Type 2 diabetes mellitus without complication, without long-term current use of insulin (HCC)  E11.9     5. Long term (current) use of oral hypoglycemic drugs  Z79.84     6. Pseudophakia  of both eyes  Z96.1     7. Epiretinal membrane (ERM) of right eye  H35.371      1. CME OD - referred back by Dr. Delories for mild recurrent CME OD -- October 2024  - pt's last visit here prior to that was on 10.28.22 - FA OD (10.30.24) shows Perifoveal petaloid hyperfluorescent leakage and hyperfluorescent staining of disc -- consistent with CME - restarted on PF and Prolensa  on 10.30.24 - today, BCVA 20/30 from 20/25  - OCT shows Interval redevelopment of cystic changes/ CME, +ERM-at 8 weeks-gtts stopped last week - recurrent CME likely worsened by vitreous prolapse to temporal cataract wound OD -- may benefit from YAG vitreolysis if CME continues to recur - also discussed possible need for anti-inflammatory tx long-term - increase PF and Prolensa  back to QDaily OD - drops refilled and sent to pharmacy  - f/u 8 weeks, DFE, OCT  2. Peripheral retinal tears OD  - Small flap tears at ora, 0300 and 0400 oclock  - s/p barrier laser retinopexy OD, 03/18/17 -- good laser in place  - stable; No new RT/RD  - monitor  3. PVD / vitreous syneresis OU  - Discussed findings and prognosis  - No RT or RD on 360 scleral depressed exam OS  - Reviewed s/s of RT/RD  - Strict return precautions for any such RT/RD signs/symptoms   4,5. DM2 without retinopathy  - last A1C 6.9 (02.25.25), 7.0 (11.25.24)  - On oral meds  - Discussed importance of tight BS and BP control  6. Pseudophakia OU  - IOLs in  good position OU -- beautiful surgeries by Dr. Camillo  - OD with history of anterior vitrectomy and 3 piece sulcus IOL  - monitor   7. Epiretinal membrane, right eye  - very mild ERM - asymptomatic, no metamorphopsia - no indication for surgery at this time - monitor  Ophthalmic Meds Ordered this visit:  Meds ordered this encounter  Medications   prednisoLONE  acetate (PRED FORTE ) 1 % ophthalmic suspension    Sig: Place 1 drop into the right eye daily.    Dispense:  15 mL    Refill:  6    Bromfenac  Sodium 0.07 % SOLN    Sig: Place 1 drop into the right eye daily.    Dispense:  3 mL    Refill:  6     Return in about 8 weeks (around 04/27/2024) for CME OD, DFE, OCT.  There are no Patient Instructions on file for this visit.   Explained the diagnoses, plan, and follow up with the patient and they expressed understanding.  Patient expressed understanding of the importance of proper follow up care.   This document serves as a record of services personally performed by Redell JUDITHANN Hans, MD, PhD. It was created on their behalf by Avelina Pereyra, COA an ophthalmic technician. The creation of this record is the provider's dictation and/or activities during the visit.   Electronically signed by: Avelina GORMAN Pereyra, COT  03/08/24  10:54 PM   This document serves as a record of services personally performed by Redell JUDITHANN Hans, MD, PhD. It was created on their behalf by Almetta Pesa, an ophthalmic technician. The creation of this record is the provider's dictation and/or activities during the visit.    Electronically signed by: Almetta Pesa, OA, 03/08/24  10:54 PM  Redell JUDITHANN Hans, M.D., Ph.D. Diseases & Surgery of the Retina and Vitreous [facility]  I have reviewed the above documentation for accuracy and completeness, and I agree with the above. Redell JUDITHANN Hans, M.D., Ph.D. 03/08/24 10:55 PM   Abbreviations: M myopia (nearsighted); A astigmatism; H hyperopia (farsighted); P presbyopia; Mrx spectacle prescription;  CTL contact lenses; OD right eye; OS left eye; OU both eyes  XT exotropia; ET esotropia; PEK punctate epithelial keratitis; PEE punctate epithelial erosions; DES dry eye syndrome; MGD meibomian gland dysfunction; ATs artificial tears; PFAT's preservative free artificial tears; NSC nuclear sclerotic cataract; PSC posterior subcapsular cataract; ERM epi-retinal membrane; PVD posterior vitreous detachment; RD retinal detachment; DM diabetes mellitus;  DR diabetic retinopathy; NPDR non-proliferative diabetic retinopathy; PDR proliferative diabetic retinopathy; CSME clinically significant macular edema; DME diabetic macular edema; dbh dot blot hemorrhages; CWS cotton wool spot; POAG primary open angle glaucoma; C/D cup-to-disc ratio; HVF humphrey visual field; GVF goldmann visual field; OCT optical coherence tomography; IOP intraocular pressure; BRVO Branch retinal vein occlusion; CRVO central retinal vein occlusion; CRAO central retinal artery occlusion; BRAO branch retinal artery occlusion; RT retinal tear; SB scleral buckle; PPV pars plana vitrectomy; VH Vitreous hemorrhage; PRP panretinal laser photocoagulation; IVK intravitreal kenalog ; VMT vitreomacular traction; MH Macular hole;  NVD neovascularization of the disc; NVE neovascularization elsewhere; AREDS age related eye disease study; ARMD age related macular degeneration; POAG primary open angle glaucoma; EBMD epithelial/anterior basement membrane dystrophy; ACIOL anterior chamber intraocular lens; IOL intraocular lens; PCIOL posterior chamber intraocular lens; Phaco/IOL phacoemulsification with intraocular lens placement; PRK photorefractive keratectomy; LASIK laser assisted in situ keratomileusis; HTN hypertension; DM diabetes mellitus; COPD chronic obstructive pulmonary disease

## 2024-02-26 ENCOUNTER — Encounter: Payer: Self-pay | Admitting: Podiatry

## 2024-02-26 ENCOUNTER — Ambulatory Visit: Admitting: Podiatry

## 2024-02-26 DIAGNOSIS — B351 Tinea unguium: Secondary | ICD-10-CM | POA: Diagnosis not present

## 2024-02-26 DIAGNOSIS — Q828 Other specified congenital malformations of skin: Secondary | ICD-10-CM

## 2024-02-26 DIAGNOSIS — M79675 Pain in left toe(s): Secondary | ICD-10-CM | POA: Diagnosis not present

## 2024-02-26 DIAGNOSIS — E1149 Type 2 diabetes mellitus with other diabetic neurological complication: Secondary | ICD-10-CM | POA: Diagnosis not present

## 2024-02-26 DIAGNOSIS — M79674 Pain in right toe(s): Secondary | ICD-10-CM | POA: Diagnosis not present

## 2024-02-26 NOTE — Progress Notes (Signed)
 Subjective:  Patient ID: Crystal [name], female    DOB: [birth year],   MRN: [id number]  Chief Complaint  Patient presents with   Debridement    Trim toenails/calluses    73 y.o. female presents for concern of thickened elongated and painful nails that are difficult to trim. Requesting to have them trimmed today. Relates burning and tingling in their feet. Patient is diabetic and last A1c was  Lab Results  Component Value Date   HGBA1C 7.9 (A) 03/07/2019   .   PCP:  Leonce Sink, MD    . Denies any other pedal complaints. Denies n/v/f/c.   Past Medical History:  Diagnosis Date   Acute peptic ulcer of stomach    As a teenager   Allergy    dust, cock roach,grass,weeds,molds   Anxiety    Arthritis    knees, hands, shoulders   Asthma    02/08/10 FEV1 1.98 (80%), s/p saba 2.22 l/m (90%).nml   Broken ankle    right foot, wearing a boot   Cognitive change 06/10/2016   Dementia (HCC)    Depression    Diabetes mellitus 2003   Type II   Diverticulosis    Dyspepsia    Fatty liver 12/19/2010   GERD (gastroesophageal reflux disease)    Hearing loss    bilateral hearing aids    Herpes simplex virus (HSV) infection    High cholesterol    Hyperlipemia    Hypertension    Hypothyroidism 09/08/2013   IBS (irritable bowel syndrome)    Insomnia    Internal hemorrhoids    Joint pain 08/08/2016   Low back pain 08/10/2016   OSA (obstructive sleep apnea)    Does not use CPAP - old one broken, new sleep study to be scheduled and then given a new CPAP machine   Plantar fasciitis    no longer an issue   Sun-damaged skin 12/09/2016   SVD (spontaneous vaginal delivery)    x 4   Vaginal Pap smear, abnormal    Vitamin D  deficiency 03/04/2016   Wears partial dentures    upper dentures and lower partial    Objective:  Physical Exam: Vascular: DP/PT pulses 2/4 bilateral. CFT <3 seconds. Absent hair growth on digits. Edema noted to bilateral lower extremities. Xerosis noted  bilaterally.  Skin. No lacerations or abrasions bilateral feet. Nails 1-5 bilateral  are thickened discolored and elongated with subungual debris. Hyperkeratotic lesion noted sub second metatarsal on the right.   Musculoskeletal: MMT 5/5 bilateral lower extremities in DF, PF, Inversion and Eversion. Deceased ROM in DF of ankle joint.  Neurological: Sensation intact to light touch. Protective sensation diminished bilateral.   Assessment:   1. Pain due to onychomycosis of toenails of both feet   2. Type II diabetes mellitus with neurological manifestations (HCC)   3. Porokeratosis       Plan:  Patient was evaluated and treated and all questions answered. -Discussed and educated patient on diabetic foot care, especially with  regards to the vascular, neurological and musculoskeletal systems.  -Stressed the importance of good glycemic control and the detriment of not  controlling glucose levels in relation to the foot. -Discussed supportive shoes at all times and checking feet regularly.  -Mechanically debrided all nails 1-5 bilateral using sterile nail nipper and filed with dremel without incident  -Hyperkeratotic lesion x 1 debrided without incident.  Sub second metatarsal on right.  -Answered all patient questions -Patient to return  in  3 months  for  at risk foot care -Patient advised to call the office if any problems or questions arise in the meantime.   Asberry Failing, DPM

## 2024-03-02 ENCOUNTER — Encounter (INDEPENDENT_AMBULATORY_CARE_PROVIDER_SITE_OTHER): Payer: Self-pay | Admitting: Ophthalmology

## 2024-03-02 ENCOUNTER — Ambulatory Visit (INDEPENDENT_AMBULATORY_CARE_PROVIDER_SITE_OTHER): Admitting: Ophthalmology

## 2024-03-02 DIAGNOSIS — H33331 Multiple defects of retina without detachment, right eye: Secondary | ICD-10-CM | POA: Diagnosis not present

## 2024-03-02 DIAGNOSIS — H35371 Puckering of macula, right eye: Secondary | ICD-10-CM

## 2024-03-02 DIAGNOSIS — E119 Type 2 diabetes mellitus without complications: Secondary | ICD-10-CM | POA: Diagnosis not present

## 2024-03-02 DIAGNOSIS — Z7984 Long term (current) use of oral hypoglycemic drugs: Secondary | ICD-10-CM

## 2024-03-02 DIAGNOSIS — H35351 Cystoid macular degeneration, right eye: Secondary | ICD-10-CM

## 2024-03-02 DIAGNOSIS — H43813 Vitreous degeneration, bilateral: Secondary | ICD-10-CM | POA: Diagnosis not present

## 2024-03-02 DIAGNOSIS — Z961 Presence of intraocular lens: Secondary | ICD-10-CM

## 2024-03-02 MED ORDER — BROMFENAC SODIUM 0.07 % OP SOLN
1.0000 [drp] | Freq: Every day | OPHTHALMIC | 6 refills | Status: AC
Start: 2024-03-02 — End: ?

## 2024-03-02 MED ORDER — PREDNISOLONE ACETATE 1 % OP SUSP
1.0000 [drp] | Freq: Every day | OPHTHALMIC | 6 refills | Status: AC
Start: 2024-03-02 — End: ?

## 2024-03-08 ENCOUNTER — Encounter (INDEPENDENT_AMBULATORY_CARE_PROVIDER_SITE_OTHER): Payer: Self-pay | Admitting: Ophthalmology

## 2024-04-24 ENCOUNTER — Encounter: Payer: Self-pay | Admitting: Podiatry

## 2024-04-25 ENCOUNTER — Telehealth: Payer: Self-pay | Admitting: Podiatry

## 2024-04-25 NOTE — Telephone Encounter (Signed)
 Patient requested to be seen only in [place] and requested to see Dr. Joya on Thursday, 04/28/24, As per Dr. Joya requested for pt to be seen urgently!.Patient refused to be seen by any other provider. Was advised of the urgent visit and refused.

## 2024-04-26 ENCOUNTER — Encounter (INDEPENDENT_AMBULATORY_CARE_PROVIDER_SITE_OTHER): Payer: Self-pay | Admitting: Ophthalmology

## 2024-04-27 ENCOUNTER — Encounter (INDEPENDENT_AMBULATORY_CARE_PROVIDER_SITE_OTHER): Admitting: Ophthalmology

## 2024-04-28 ENCOUNTER — Other Ambulatory Visit: Payer: Self-pay | Admitting: *Deleted

## 2024-04-28 ENCOUNTER — Ambulatory Visit (INDEPENDENT_AMBULATORY_CARE_PROVIDER_SITE_OTHER): Payer: Self-pay

## 2024-04-28 ENCOUNTER — Encounter: Payer: Self-pay | Admitting: Podiatry

## 2024-04-28 ENCOUNTER — Ambulatory Visit: Admitting: Podiatry

## 2024-04-28 DIAGNOSIS — M79675 Pain in left toe(s): Secondary | ICD-10-CM

## 2024-04-28 DIAGNOSIS — M79674 Pain in right toe(s): Secondary | ICD-10-CM | POA: Diagnosis not present

## 2024-04-28 DIAGNOSIS — E1149 Type 2 diabetes mellitus with other diabetic neurological complication: Secondary | ICD-10-CM | POA: Diagnosis not present

## 2024-04-28 DIAGNOSIS — M2041 Other hammer toe(s) (acquired), right foot: Secondary | ICD-10-CM

## 2024-04-28 DIAGNOSIS — Q828 Other specified congenital malformations of skin: Secondary | ICD-10-CM

## 2024-04-28 DIAGNOSIS — B351 Tinea unguium: Secondary | ICD-10-CM | POA: Diagnosis not present

## 2024-04-28 NOTE — Progress Notes (Signed)
 Subjective:  Patient ID: Crystal [name], female    DOB: 09-15-1950,   MRN: [id number]  Chief Complaint  Patient presents with   Diabetes    I need my nails trimmed.  The third toe on my right foot is starting to turn black on me. (Toenail 3rd)  Saw Dr. Delon Nigh - 04/19/2024; A1c - 6.8    73 y.o. female presents for concern of darkening of her right third toenail. Also concern of thickened elongated and painful nails that are difficult to trim. Requesting to have them trimmed today. Relates burning and tingling in their feet. Patient is diabetic and last A1c was  Lab Results  Component Value Date   HGBA1C 7.9 (A) 03/07/2019    PCP:  Leonce Sink, MD    . Denies any other pedal complaints. Denies n/v/f/c.   Past Medical History:  Diagnosis Date   Acute peptic ulcer of stomach    As a teenager   Allergy    dust, cock roach,grass,weeds,molds   Anxiety    Arthritis    knees, hands, shoulders   Asthma    02/08/10 FEV1 1.98 (80%), s/p saba 2.22 l/m (90%).nml   Broken ankle    right foot, wearing a boot   Cognitive change 06/10/2016   Dementia (HCC)    Depression    Diabetes mellitus 2003   Type II   Diverticulosis    Dyspepsia    Fatty liver 12/19/2010   GERD (gastroesophageal reflux disease)    Hearing loss    bilateral hearing aids    Herpes simplex virus (HSV) infection    High cholesterol    Hyperlipemia    Hypertension    Hypothyroidism 09/08/2013   IBS (irritable bowel syndrome)    Insomnia    Internal hemorrhoids    Joint pain 08/08/2016   Low back pain 08/10/2016   OSA (obstructive sleep apnea)    Does not use CPAP - old one broken, new sleep study to be scheduled and then given a new CPAP machine   Plantar fasciitis    no longer an issue   Sun-damaged skin 12/09/2016   SVD (spontaneous vaginal delivery)    x 4   Vaginal Pap smear, abnormal    Vitamin D  deficiency 03/04/2016   Wears partial dentures    upper dentures and lower partial     Objective:  Physical Exam: Vascular: DP/PT pulses 2/4 bilateral. CFT <3 seconds. Absent hair growth on digits. Edema noted to bilateral lower extremities. Xerosis noted bilaterally.  Skin. No lacerations or abrasions bilateral feet. Nails 1-5 bilateral  are thickened discolored and elongated with subungual debris. Hyperkeratotic lesion noted sub second metatarsal on the right.  Mild bruising noted to right third digit nail plate.  Musculoskeletal: MMT 5/5 bilateral lower extremities in DF, PF, Inversion and Eversion. Deceased ROM in DF of ankle joint.  Neurological: Sensation intact to light touch. Protective sensation diminished bilateral.   Assessment:   1. Pain due to onychomycosis of toenails of both feet   2. Type II diabetes mellitus with neurological manifestations (HCC)   3. Porokeratosis        Plan:  Patient was evaluated and treated and all questions answered. -Discussed and educated patient on diabetic foot care, especially with  regards to the vascular, neurological and musculoskeletal systems.  -Stressed the importance of good glycemic control and the detriment of not  controlling glucose levels in relation to the foot. -Discussed supportive shoes at all times and checking feet  regularly.  -Mechanically debrided all nails 1-5 bilateral using sterile nail nipper and filed with dremel without incident  -Hyperkeratotic lesion x 1 debrided without incident.  Sub second metatarsal on right.  -Answered all patient questions -Patient to return  in  3 months  for at risk foot care -Patient advised to call the office if any problems or questions arise in the meantime.   Crystal [name], DPM

## 2024-05-26 ENCOUNTER — Ambulatory Visit: Admitting: Podiatry

## 2024-05-26 ENCOUNTER — Encounter: Payer: Self-pay | Admitting: Podiatry

## 2024-05-26 DIAGNOSIS — Q828 Other specified congenital malformations of skin: Secondary | ICD-10-CM | POA: Diagnosis not present

## 2024-05-26 DIAGNOSIS — E1149 Type 2 diabetes mellitus with other diabetic neurological complication: Secondary | ICD-10-CM

## 2024-05-26 DIAGNOSIS — M79674 Pain in right toe(s): Secondary | ICD-10-CM

## 2024-05-26 DIAGNOSIS — M79675 Pain in left toe(s): Secondary | ICD-10-CM | POA: Diagnosis not present

## 2024-05-26 DIAGNOSIS — B351 Tinea unguium: Secondary | ICD-10-CM | POA: Diagnosis not present

## 2024-05-26 NOTE — Progress Notes (Signed)
 Subjective:  Patient ID: Crystal [name], female    DOB: 1950-11-30,   MRN: [id number]  Chief Complaint  Patient presents with   Diabetes    I'm here for just a foot check and probably trim my toes. Saw Dr. Delon Nigh - 04/19/2024; A1c - 6.8    73 y.o. female presents for concern of darkening of her right third toenail. Also concern of thickened elongated and painful nails that are difficult to trim. Requesting to have them trimmed today. Relates burning and tingling in their feet. Patient is diabetic and last A1c was  Lab Results  Component Value Date   HGBA1C 7.9 (A) 03/07/2019    PCP:  Leonce Sink, MD    . Denies any other pedal complaints. Denies n/v/f/c.   Past Medical History:  Diagnosis Date   Acute peptic ulcer of stomach    As a teenager   Allergy    dust, cock roach,grass,weeds,molds   Anxiety    Arthritis    knees, hands, shoulders   Asthma    02/08/10 FEV1 1.98 (80%), s/p saba 2.22 l/m (90%).nml   Broken ankle    right foot, wearing a boot   Cognitive change 06/10/2016   Dementia (HCC)    Depression    Diabetes mellitus 2003   Type II   Diverticulosis    Dyspepsia    Fatty liver 12/19/2010   GERD (gastroesophageal reflux disease)    Hearing loss    bilateral hearing aids    Herpes simplex virus (HSV) infection    High cholesterol    Hyperlipemia    Hypertension    Hypothyroidism 09/08/2013   IBS (irritable bowel syndrome)    Insomnia    Internal hemorrhoids    Joint pain 08/08/2016   Low back pain 08/10/2016   OSA (obstructive sleep apnea)    Does not use CPAP - old one broken, new sleep study to be scheduled and then given a new CPAP machine   Plantar fasciitis    no longer an issue   Sun-damaged skin 12/09/2016   SVD (spontaneous vaginal delivery)    x 4   Vaginal Pap smear, abnormal    Vitamin D  deficiency 03/04/2016   Wears partial dentures    upper dentures and lower partial    Objective:  Physical Exam: Vascular: DP/PT  pulses 2/4 bilateral. CFT <3 seconds. Absent hair growth on digits. Edema noted to bilateral lower extremities. Xerosis noted bilaterally.  Skin. No lacerations or abrasions bilateral feet. Nails 1-5 bilateral  are thickened discolored and elongated with subungual debris. Hyperkeratotic lesion noted sub second metatarsal on the right.  Mild bruising noted to right third digit nail plate.  Musculoskeletal: MMT 5/5 bilateral lower extremities in DF, PF, Inversion and Eversion. Deceased ROM in DF of ankle joint.  Neurological: Sensation intact to light touch. Protective sensation diminished bilateral.   Assessment:   1. Pain due to onychomycosis of toenails of both feet   2. Type II diabetes mellitus with neurological manifestations (HCC)   3. Porokeratosis        Plan:  Patient was evaluated and treated and all questions answered. -Discussed and educated patient on diabetic foot care, especially with  regards to the vascular, neurological and musculoskeletal systems.  -Stressed the importance of good glycemic control and the detriment of not  controlling glucose levels in relation to the foot. -Discussed supportive shoes at all times and checking feet regularly.  -Mechanically debrided all nails 1-5 bilateral using sterile nail  nipper and filed with dremel without incident  -Hyperkeratotic lesion x 1 debrided without incident.  Sub second metatarsal on right as courtesy.  Discussed callus care and prevention techniques as well as padding to help with offloading this area. -Answered all patient questions -Patient to return  in  3 months  for at risk foot care -Patient advised to call the office if any problems or questions arise in the meantime.   Asberry Failing, DPM

## 2024-06-02 ENCOUNTER — Ambulatory Visit: Admitting: Podiatry

## 2024-07-18 ENCOUNTER — Encounter: Payer: Self-pay | Admitting: Podiatry

## 2024-07-18 ENCOUNTER — Ambulatory Visit: Admitting: Podiatry

## 2024-07-18 DIAGNOSIS — B351 Tinea unguium: Secondary | ICD-10-CM

## 2024-07-18 DIAGNOSIS — M79674 Pain in right toe(s): Secondary | ICD-10-CM

## 2024-07-18 DIAGNOSIS — M79675 Pain in left toe(s): Secondary | ICD-10-CM | POA: Diagnosis not present

## 2024-07-18 DIAGNOSIS — E1149 Type 2 diabetes mellitus with other diabetic neurological complication: Secondary | ICD-10-CM

## 2024-07-18 NOTE — Progress Notes (Signed)
 Error

## 2024-08-04 ENCOUNTER — Ambulatory Visit: Admitting: Podiatry

## 2024-08-25 ENCOUNTER — Ambulatory Visit: Admitting: Podiatry
